# Patient Record
Sex: Female | Born: 1950 | Race: Black or African American | Hispanic: No | Marital: Single | Smoking: Current every day smoker
Health system: Southern US, Community
[De-identification: ages and names within clinical notes are randomized; demographics above are authoritative.]

## PROBLEM LIST (undated history)

## (undated) DIAGNOSIS — E7800 Pure hypercholesterolemia, unspecified: Secondary | ICD-10-CM

## (undated) DIAGNOSIS — H269 Unspecified cataract: Secondary | ICD-10-CM

## (undated) DIAGNOSIS — F329 Major depressive disorder, single episode, unspecified: Secondary | ICD-10-CM

## (undated) DIAGNOSIS — M549 Dorsalgia, unspecified: Secondary | ICD-10-CM

## (undated) DIAGNOSIS — G8929 Other chronic pain: Secondary | ICD-10-CM

## (undated) DIAGNOSIS — I10 Essential (primary) hypertension: Secondary | ICD-10-CM

## (undated) DIAGNOSIS — N200 Calculus of kidney: Secondary | ICD-10-CM

## (undated) DIAGNOSIS — F32A Depression, unspecified: Secondary | ICD-10-CM

## (undated) DIAGNOSIS — J189 Pneumonia, unspecified organism: Secondary | ICD-10-CM

## (undated) DIAGNOSIS — I161 Hypertensive emergency: Secondary | ICD-10-CM

## (undated) DIAGNOSIS — N390 Urinary tract infection, site not specified: Secondary | ICD-10-CM

## (undated) DIAGNOSIS — G629 Polyneuropathy, unspecified: Secondary | ICD-10-CM

## (undated) DIAGNOSIS — E119 Type 2 diabetes mellitus without complications: Secondary | ICD-10-CM

## (undated) DIAGNOSIS — R519 Headache, unspecified: Secondary | ICD-10-CM

## (undated) DIAGNOSIS — G43909 Migraine, unspecified, not intractable, without status migrainosus: Secondary | ICD-10-CM

## (undated) DIAGNOSIS — I2699 Other pulmonary embolism without acute cor pulmonale: Secondary | ICD-10-CM

## (undated) DIAGNOSIS — D735 Infarction of spleen: Secondary | ICD-10-CM

## (undated) DIAGNOSIS — M1990 Unspecified osteoarthritis, unspecified site: Secondary | ICD-10-CM

## (undated) DIAGNOSIS — R51 Headache: Secondary | ICD-10-CM

## (undated) HISTORY — DX: Major depressive disorder, single episode, unspecified: F32.9

## (undated) HISTORY — PX: CHOLECYSTECTOMY: SHX55

## (undated) HISTORY — PX: EYE SURGERY: SHX253

## (undated) HISTORY — PX: CARPAL TUNNEL RELEASE: SHX101

## (undated) HISTORY — PX: APPENDECTOMY: SHX54

## (undated) HISTORY — PX: TONSILLECTOMY: SUR1361

## (undated) HISTORY — PX: CYSTOSCOPY W/ STONE MANIPULATION: SHX1427

## (undated) HISTORY — DX: Infarction of spleen: D73.5

## (undated) HISTORY — PX: BREAST BIOPSY: SHX20

## (undated) HISTORY — PX: REFRACTIVE SURGERY: SHX103

## (undated) HISTORY — DX: Depression, unspecified: F32.A

## (undated) HISTORY — PX: VAGINAL HYSTERECTOMY: SUR661

## (undated) HISTORY — DX: Polyneuropathy, unspecified: G62.9

---

## 1980-12-21 HISTORY — PX: DILATION AND CURETTAGE OF UTERUS: SHX78

## 2012-08-16 ENCOUNTER — Encounter (HOSPITAL_COMMUNITY): Payer: Self-pay | Admitting: Emergency Medicine

## 2012-08-16 ENCOUNTER — Encounter (HOSPITAL_COMMUNITY): Payer: Self-pay | Admitting: *Deleted

## 2012-08-16 ENCOUNTER — Observation Stay (HOSPITAL_COMMUNITY)
Admission: EM | Admit: 2012-08-16 | Discharge: 2012-08-21 | Disposition: A | Discharge status: Home / Self Care | Payer: Medicare Other | Attending: Internal Medicine | Admitting: Internal Medicine

## 2012-08-16 ENCOUNTER — Emergency Department (HOSPITAL_COMMUNITY): Payer: Medicare Other

## 2012-08-16 ENCOUNTER — Emergency Department (INDEPENDENT_AMBULATORY_CARE_PROVIDER_SITE_OTHER)
Admission: EM | Admit: 2012-08-16 | Discharge: 2012-08-16 | Disposition: A | Discharge status: Nursing Home (Includes ICF) | Payer: Medicare Other | Source: Home / Self Care | Attending: Emergency Medicine | Admitting: Emergency Medicine

## 2012-08-16 DIAGNOSIS — M549 Dorsalgia, unspecified: Secondary | ICD-10-CM

## 2012-08-16 DIAGNOSIS — R5383 Other fatigue: Secondary | ICD-10-CM | POA: Diagnosis not present

## 2012-08-16 DIAGNOSIS — R5381 Other malaise: Secondary | ICD-10-CM | POA: Insufficient documentation

## 2012-08-16 DIAGNOSIS — T783XXA Angioneurotic edema, initial encounter: Secondary | ICD-10-CM | POA: Insufficient documentation

## 2012-08-16 DIAGNOSIS — R197 Diarrhea, unspecified: Secondary | ICD-10-CM | POA: Insufficient documentation

## 2012-08-16 DIAGNOSIS — I10 Essential (primary) hypertension: Secondary | ICD-10-CM | POA: Insufficient documentation

## 2012-08-16 DIAGNOSIS — E119 Type 2 diabetes mellitus without complications: Secondary | ICD-10-CM

## 2012-08-16 DIAGNOSIS — E1149 Type 2 diabetes mellitus with other diabetic neurological complication: Secondary | ICD-10-CM | POA: Insufficient documentation

## 2012-08-16 DIAGNOSIS — A498 Other bacterial infections of unspecified site: Secondary | ICD-10-CM | POA: Insufficient documentation

## 2012-08-16 DIAGNOSIS — E1143 Type 2 diabetes mellitus with diabetic autonomic (poly)neuropathy: Secondary | ICD-10-CM

## 2012-08-16 DIAGNOSIS — F17200 Nicotine dependence, unspecified, uncomplicated: Secondary | ICD-10-CM | POA: Insufficient documentation

## 2012-08-16 DIAGNOSIS — K7689 Other specified diseases of liver: Secondary | ICD-10-CM | POA: Diagnosis not present

## 2012-08-16 DIAGNOSIS — E1142 Type 2 diabetes mellitus with diabetic polyneuropathy: Secondary | ICD-10-CM | POA: Diagnosis present

## 2012-08-16 DIAGNOSIS — Z9181 History of falling: Secondary | ICD-10-CM | POA: Insufficient documentation

## 2012-08-16 DIAGNOSIS — R42 Dizziness and giddiness: Secondary | ICD-10-CM | POA: Insufficient documentation

## 2012-08-16 DIAGNOSIS — R109 Unspecified abdominal pain: Principal | ICD-10-CM | POA: Insufficient documentation

## 2012-08-16 DIAGNOSIS — R29898 Other symptoms and signs involving the musculoskeletal system: Secondary | ICD-10-CM | POA: Diagnosis not present

## 2012-08-16 DIAGNOSIS — K3184 Gastroparesis: Secondary | ICD-10-CM | POA: Insufficient documentation

## 2012-08-16 DIAGNOSIS — N390 Urinary tract infection, site not specified: Secondary | ICD-10-CM | POA: Diagnosis present

## 2012-08-16 DIAGNOSIS — T361X5A Adverse effect of cephalosporins and other beta-lactam antibiotics, initial encounter: Secondary | ICD-10-CM | POA: Insufficient documentation

## 2012-08-16 DIAGNOSIS — Y9210 Unspecified residential institution as the place of occurrence of the external cause: Secondary | ICD-10-CM | POA: Insufficient documentation

## 2012-08-16 DIAGNOSIS — N3000 Acute cystitis without hematuria: Secondary | ICD-10-CM | POA: Insufficient documentation

## 2012-08-16 DIAGNOSIS — G8929 Other chronic pain: Secondary | ICD-10-CM | POA: Diagnosis present

## 2012-08-16 DIAGNOSIS — T7840XA Allergy, unspecified, initial encounter: Secondary | ICD-10-CM | POA: Diagnosis not present

## 2012-08-16 DIAGNOSIS — Z86711 Personal history of pulmonary embolism: Secondary | ICD-10-CM | POA: Insufficient documentation

## 2012-08-16 DIAGNOSIS — R918 Other nonspecific abnormal finding of lung field: Secondary | ICD-10-CM | POA: Diagnosis not present

## 2012-08-16 DIAGNOSIS — B9620 Unspecified Escherichia coli [E. coli] as the cause of diseases classified elsewhere: Secondary | ICD-10-CM

## 2012-08-16 HISTORY — DX: Urinary tract infection, site not specified: N39.0

## 2012-08-16 HISTORY — DX: Essential (primary) hypertension: I10

## 2012-08-16 HISTORY — DX: Other pulmonary embolism without acute cor pulmonale: I26.99

## 2012-08-16 LAB — COMPREHENSIVE METABOLIC PANEL
ALT: 13 U/L (ref 0–35)
AST: 13 U/L (ref 0–37)
Albumin: 2.7 g/dL — ABNORMAL LOW (ref 3.5–5.2)
CO2: 31 mEq/L (ref 19–32)
Calcium: 9.9 mg/dL (ref 8.4–10.5)
GFR calc non Af Amer: 89 mL/min — ABNORMAL LOW (ref >90)
Sodium: 141 mEq/L (ref 135–145)
Total Protein: 7.3 g/dL (ref 6.0–8.3)

## 2012-08-16 LAB — URINE MICROSCOPIC-ADD ON

## 2012-08-16 LAB — CBC WITH DIFFERENTIAL/PLATELET
Basophils Absolute: 0.1 10*3/uL (ref 0.0–0.1)
Basophils Relative: 1 % (ref 0–1)
Eosinophils Relative: 3 % (ref 0–5)
Lymphocytes Relative: 30 % (ref 12–46)
MCHC: 33.9 g/dL (ref 30.0–36.0)
MCV: 83.5 fL (ref 78.0–100.0)
Platelets: 326 10*3/uL (ref 150–400)
RDW: 15.5 % (ref 11.5–15.5)
WBC: 9 10*3/uL (ref 4.0–10.5)

## 2012-08-16 LAB — URINALYSIS, ROUTINE W REFLEX MICROSCOPIC
Leukocytes, UA: NEGATIVE
Protein, ur: >300 mg/dL — AB
Urobilinogen, UA: 0.2 mg/dL (ref 0.0–1.0)

## 2012-08-16 MED ORDER — DEXTROSE 5 % IV SOLN
1.0000 g | Freq: Once | INTRAVENOUS | Status: AC
  Administered 2012-08-16: 1 g via INTRAVENOUS
  Filled 2012-08-16: qty 10

## 2012-08-16 MED ORDER — SODIUM CHLORIDE 0.9 % IV BOLUS (SEPSIS)
1000.0000 mL | Freq: Once | INTRAVENOUS | Status: AC
  Administered 2012-08-16: 1000 mL via INTRAVENOUS

## 2012-08-16 NOTE — ED Notes (Signed)
The pt is c/o weakness and having multiple falls.  She has not had her diabetic bp or coumadin for 3 months

## 2012-08-16 NOTE — ED Notes (Signed)
Recently moved to area. Patient is out of her regular medications

## 2012-08-16 NOTE — ED Provider Notes (Signed)
History     CSN: [id number]  Arrival date & time 08/16/12  1454   None     Chief Complaint  Patient presents with  . Fall    (Consider location/radiation/quality/duration/timing/severity/associated sxs/prior treatment) The history is provided by the patient.   Joy Butler is a 61 y.o. female who would like a refill of her medications, states she has been out of medications for three months related to financial issues.  There is a known history of hypertension, diabetes, pulmonary embolism.  Primary care provider is in [place].  Reports general ill feeling, reports concern related to bilateral leg weakness associated with back pain.  States previous history of injections in back.  States in the past week she has been increasingly weak with decreased inability to stand for longer periods of time.     Past Medical History  Diagnosis Date  . Diabetes mellitus   . Hypertension   . Pulmonary embolism      Past Surgical History  Procedure Date  . Abdominal hysterectomy     No family history on file.  History  Substance Use Topics  . Smoking status: Current Everyday Smoker  . Smokeless tobacco: Not on file  . Alcohol Use: No    OB History    Grav Para Term Preterm Abortions TAB SAB Ect Mult Living                  Review of Systems  Eyes: Positive for photophobia.  Respiratory: Negative.   Cardiovascular: Negative.   Musculoskeletal: Positive for myalgias and back pain.  Skin: Negative.   Neurological: Positive for dizziness and weakness.    Allergies  Sulfa antibiotics and Sulfur  Home Medications  No current outpatient prescriptions on file.  BP 159/87  Pulse 97  Temp 98.5 F (36.9 C) (Oral)  Resp 20  SpO2 100%  Physical Exam  Nursing note and vitals reviewed. Constitutional: She is oriented to person, place, and time. Vital signs are normal. She appears well-developed and well-nourished. She is active and cooperative.       Tearful during  exam  HENT:  Head: Normocephalic.  Eyes: Conjunctivae and EOM are normal. Pupils are equal, round, and reactive to light. No scleral icterus.       photophobia  Neck: Trachea normal. Neck supple. No spinous process tenderness and no muscular tenderness present.  Cardiovascular: Normal rate, regular rhythm, normal heart sounds, intact distal pulses and normal pulses.   Pulmonary/Chest: Effort normal and breath sounds normal.  Abdominal: Soft. Normal appearance. There is tenderness in the right upper quadrant. There is no CVA tenderness.  Musculoskeletal:       Right knee: Normal.       Left knee: Normal.       Right ankle: Normal. Achilles tendon normal.       Left ankle: Normal. Achilles tendon normal.       Cervical back: Normal.       Thoracic back: Normal.       Lumbar back: She exhibits tenderness and spasm.       Back:  Neurological: She is alert and oriented to person, place, and time. She has normal strength. No cranial nerve deficit or sensory deficit. Coordination normal. GCS eye subscore is 4. GCS verbal subscore is 5. GCS motor subscore is 6.  Skin: Skin is warm and dry.  Psychiatric: She has a normal mood and affect. Her speech is normal and behavior is normal. Judgment and thought content  normal. Cognition and memory are normal.    ED Course  Procedures (including critical care time)  Labs Reviewed - No data to display No results found.   1. Leg weakness, bilateral       MDM  Transfer to [facility] for further evaluation of abdominal pain, weakness and management of health conditions.         Johnsie Kindred, NP 08/16/12 2152

## 2012-08-16 NOTE — ED Notes (Signed)
Patient has bilateral leg weakness, right with more weakness than left.  Reports knees give away easily.  Patient is new to the area and has not had routine medicines in several months, symptoms have been for vague period of time

## 2012-08-17 ENCOUNTER — Encounter (HOSPITAL_COMMUNITY): Payer: Self-pay | Admitting: Radiology

## 2012-08-17 ENCOUNTER — Emergency Department (HOSPITAL_COMMUNITY): Payer: Medicare Other

## 2012-08-17 DIAGNOSIS — R109 Unspecified abdominal pain: Secondary | ICD-10-CM | POA: Diagnosis present

## 2012-08-17 DIAGNOSIS — R5381 Other malaise: Secondary | ICD-10-CM | POA: Diagnosis not present

## 2012-08-17 DIAGNOSIS — E119 Type 2 diabetes mellitus without complications: Secondary | ICD-10-CM | POA: Diagnosis present

## 2012-08-17 DIAGNOSIS — I10 Essential (primary) hypertension: Secondary | ICD-10-CM | POA: Diagnosis not present

## 2012-08-17 DIAGNOSIS — K7689 Other specified diseases of liver: Secondary | ICD-10-CM | POA: Diagnosis not present

## 2012-08-17 DIAGNOSIS — Z86711 Personal history of pulmonary embolism: Secondary | ICD-10-CM

## 2012-08-17 DIAGNOSIS — R197 Diarrhea, unspecified: Secondary | ICD-10-CM | POA: Diagnosis not present

## 2012-08-17 LAB — CBC WITH DIFFERENTIAL/PLATELET
Basophils Absolute: 0 10*3/uL (ref 0.0–0.1)
HCT: 41 % (ref 36.0–46.0)
Lymphocytes Relative: 27 % (ref 12–46)
Monocytes Absolute: 0.7 10*3/uL (ref 0.1–1.0)
Neutro Abs: 4.7 10*3/uL (ref 1.7–7.7)
Platelets: 269 10*3/uL (ref 150–400)
RDW: 15.6 % — ABNORMAL HIGH (ref 11.5–15.5)
WBC: 7.7 10*3/uL (ref 4.0–10.5)

## 2012-08-17 LAB — COMPREHENSIVE METABOLIC PANEL
Albumin: 2.4 g/dL — ABNORMAL LOW (ref 3.5–5.2)
Alkaline Phosphatase: 114 U/L (ref 39–117)
Alkaline Phosphatase: 117 U/L (ref 39–117)
BUN: 8 mg/dL (ref 6–23)
BUN: 8 mg/dL (ref 6–23)
CO2: 31 mEq/L (ref 19–32)
Calcium: 9 mg/dL (ref 8.4–10.5)
Chloride: 105 mEq/L (ref 96–112)
Creatinine, Ser: 0.87 mg/dL (ref 0.50–1.10)
GFR calc Af Amer: 81 mL/min — ABNORMAL LOW (ref >90)
GFR calc non Af Amer: 88 mL/min — ABNORMAL LOW (ref >90)
Glucose, Bld: 181 mg/dL — ABNORMAL HIGH (ref 70–99)
Glucose, Bld: 198 mg/dL — ABNORMAL HIGH (ref 70–99)
Potassium: 3.2 mEq/L — ABNORMAL LOW (ref 3.5–5.1)
Potassium: 3.2 mEq/L — ABNORMAL LOW (ref 3.5–5.1)
Total Bilirubin: 0.2 mg/dL — ABNORMAL LOW (ref 0.3–1.2)
Total Protein: 6.4 g/dL (ref 6.0–8.3)

## 2012-08-17 LAB — GLUCOSE, CAPILLARY: Glucose-Capillary: 160 mg/dL — ABNORMAL HIGH (ref 70–99)

## 2012-08-17 LAB — LACTIC ACID, PLASMA: Lactic Acid, Venous: 1.4 mmol/L (ref 0.5–2.2)

## 2012-08-17 LAB — LIPASE, BLOOD: Lipase: 45 U/L (ref 11–59)

## 2012-08-17 LAB — RAPID URINE DRUG SCREEN, HOSP PERFORMED
Amphetamines: NOT DETECTED
Benzodiazepines: NOT DETECTED
Opiates: POSITIVE — AB

## 2012-08-17 MED ORDER — LOSARTAN POTASSIUM 50 MG PO TABS
100.0000 mg | ORAL_TABLET | Freq: Every day | ORAL | Status: DC
  Administered 2012-08-17 – 2012-08-21 (×5): 100 mg via ORAL
  Filled 2012-08-17 (×5): qty 2

## 2012-08-17 MED ORDER — ONDANSETRON HCL 4 MG/2ML IJ SOLN
4.0000 mg | Freq: Four times a day (QID) | INTRAMUSCULAR | Status: DC | PRN
  Administered 2012-08-17: 4 mg via INTRAVENOUS
  Filled 2012-08-17: qty 2

## 2012-08-17 MED ORDER — POTASSIUM CHLORIDE 10 MEQ/100ML IV SOLN
10.0000 meq | INTRAVENOUS | Status: AC
  Administered 2012-08-17 (×4): 10 meq via INTRAVENOUS
  Filled 2012-08-17 (×4): qty 100

## 2012-08-17 MED ORDER — ONDANSETRON HCL 4 MG/2ML IJ SOLN
4.0000 mg | Freq: Once | INTRAMUSCULAR | Status: AC
  Administered 2012-08-17: 4 mg via INTRAVENOUS
  Filled 2012-08-17: qty 2

## 2012-08-17 MED ORDER — INSULIN ASPART 100 UNIT/ML ~~LOC~~ SOLN
0.0000 [IU] | Freq: Three times a day (TID) | SUBCUTANEOUS | Status: DC
  Administered 2012-08-17: 2 [IU] via SUBCUTANEOUS
  Administered 2012-08-17: 18:00:00 via SUBCUTANEOUS
  Administered 2012-08-18: 3 [IU] via SUBCUTANEOUS
  Administered 2012-08-18 (×2): 1 [IU] via SUBCUTANEOUS
  Administered 2012-08-19 (×2): 2 [IU] via SUBCUTANEOUS
  Administered 2012-08-19 – 2012-08-20 (×3): 5 [IU] via SUBCUTANEOUS
  Administered 2012-08-20: 17:00:00 via SUBCUTANEOUS
  Administered 2012-08-21: 5 [IU] via SUBCUTANEOUS

## 2012-08-17 MED ORDER — MORPHINE SULFATE 4 MG/ML IJ SOLN
4.0000 mg | Freq: Once | INTRAMUSCULAR | Status: AC
  Administered 2012-08-18: 4 mg via INTRAVENOUS
  Filled 2012-08-17: qty 1

## 2012-08-17 MED ORDER — MORPHINE SULFATE 4 MG/ML IJ SOLN
4.0000 mg | Freq: Once | INTRAMUSCULAR | Status: AC
  Administered 2012-08-17: 4 mg via INTRAVENOUS
  Filled 2012-08-17: qty 1

## 2012-08-17 MED ORDER — POTASSIUM CHLORIDE IN NACL 20-0.9 MEQ/L-% IV SOLN
INTRAVENOUS | Status: DC
  Administered 2012-08-17 – 2012-08-20 (×5): via INTRAVENOUS
  Filled 2012-08-17 (×14): qty 1000

## 2012-08-17 MED ORDER — ONDANSETRON HCL 4 MG PO TABS: 4.0000 mg | ORAL_TABLET | Freq: Four times a day (QID) | ORAL | Status: DC | PRN

## 2012-08-17 MED ORDER — DEXTROSE 5 % IV SOLN
1.0000 g | INTRAVENOUS | Status: DC
  Administered 2012-08-17 – 2012-08-18 (×2): 1 g via INTRAVENOUS
  Filled 2012-08-17 (×3): qty 10

## 2012-08-17 MED ORDER — IOHEXOL 300 MG/ML  SOLN
100.0000 mL | Freq: Once | INTRAMUSCULAR | Status: AC | PRN
  Administered 2012-08-17: 100 mL via INTRAVENOUS

## 2012-08-17 MED ORDER — ACETAMINOPHEN 650 MG RE SUPP: 650.0000 mg | Freq: Four times a day (QID) | RECTAL | Status: DC | PRN

## 2012-08-17 MED ORDER — MORPHINE SULFATE 2 MG/ML IJ SOLN
1.0000 mg | INTRAMUSCULAR | Status: DC | PRN
  Administered 2012-08-17 – 2012-08-20 (×8): 1 mg via INTRAVENOUS
  Filled 2012-08-17 (×8): qty 1

## 2012-08-17 MED ORDER — ACETAMINOPHEN 325 MG PO TABS
650.0000 mg | ORAL_TABLET | Freq: Four times a day (QID) | ORAL | Status: DC | PRN
  Administered 2012-08-18: 650 mg via ORAL
  Filled 2012-08-17: qty 2

## 2012-08-17 MED ORDER — HYDROMORPHONE HCL PF 1 MG/ML IJ SOLN
1.0000 mg | Freq: Once | INTRAMUSCULAR | Status: AC
  Administered 2012-08-17: 1 mg via INTRAVENOUS
  Filled 2012-08-17: qty 1

## 2012-08-17 MED ORDER — SODIUM CHLORIDE 0.9 % IJ SOLN
3.0000 mL | Freq: Two times a day (BID) | INTRAMUSCULAR | Status: DC
  Administered 2012-08-17 – 2012-08-19 (×4): 3 mL via INTRAVENOUS

## 2012-08-17 MED ORDER — SODIUM CHLORIDE 0.9 % IV SOLN
INTRAVENOUS | Status: DC
  Administered 2012-08-17: 01:00:00 via INTRAVENOUS

## 2012-08-17 NOTE — Consult Note (Addendum)
Urology Consult   Physician requesting consult: Dr. Toniann Fail   Reason for consult: Air in bladder on CT scan  History of Present Illness: Joy Butler is a 61 y.o. AA female with PMH significant for DM, HTN, and PE who was admitted yesterday for treatment of UTI and diarrhea.  She states she has had severe abdominal pain and diarrhea for at least a month.  She denies N/V.  CT scan in ED was unremarkable except for air in the bladder.  UA was consistent with UTI.  Culture is pending.  She is currently receiving IV Rocephin.  C Diff and stool cultures are pending as well.  She denies recent bladder instrumentation or catheters.    She states she was hospitalized in 03/2012 in [place] for a pneumonia and was treated for a UTI at that time as well.  She has had a couple of UTIs a year for several years.  She is sexually active but does not note a correlation with her infections.  She denies passing air with urine, however, she states that "it feels like that area burps after I pee."  She denies dysuria, hematuria, incontinence, and fecal matter in her urine.  She denies a hx of diverticulosis/diverticulitis.  She had one kidney stone in the 1990s which required removal.    Currently her only complain is of abd pain which she states is diffuse.  She denies F/C, SOB, CP, N/V, and LE pain.     Past Medical History  Diagnosis Date  . Diabetes mellitus   . Hypertension   . Pulmonary embolism     Past Surgical History  Procedure Date  . Abdominal hysterectomy   cholecystectomy--open c section Kidney stone manipulation Left carpel tunnel repair   Current Hospital Medications:  Home medications: Prior to Admission medications   Medication Sig Start Date End Date Taking? Authorizing Provider  insulin aspart (NOVOLOG) 100 UNIT/ML injection Inject into the skin 3 (three) times daily before meals.   Yes Historical Provider, MD  losartan-hydrochlorothiazide (HYZAAR) 100-25 MG per tablet  Take 1 tablet by mouth daily.   Yes Historical Provider, MD    Scheduled Meds:   . cefTRIAXone (ROCEPHIN)  IV  1 g Intravenous Once  . cefTRIAXone (ROCEPHIN)  IV  1 g Intravenous Q24H  .  HYDROmorphone (DILAUDID) injection  1 mg Intravenous Once  . insulin aspart  0-9 Units Subcutaneous TID WC  . losartan  100 mg Oral Daily  .  morphine injection  4 mg Intravenous Once  .  morphine injection  4 mg Intravenous Once  .  morphine injection  4 mg Intravenous Once  . ondansetron  4 mg Intravenous Once  . ondansetron  4 mg Intravenous Once  . sodium chloride  1,000 mL Intravenous Once  . sodium chloride  3 mL Intravenous Q12H   Continuous Infusions:   . 0.9 % NaCl with KCl 20 mEq / L 100 mL/hr at 08/17/12 0825  . DISCONTD: sodium chloride 125 mL/hr at 08/17/12 0053   PRN Meds:.acetaminophen, acetaminophen, iohexol, morphine injection, ondansetron (ZOFRAN) IV, ondansetron  Allergies:  Allergies  Allergen Reactions  . Sulfa Antibiotics Rash  . Sulfur Rash    History reviewed. No pertinent family history.  Social History:  reports that she has been smoking.  She does not have any smokeless tobacco history on file. She reports that she does not drink alcohol or use illicit drugs.  ROS: A complete review of systems was performed.  All systems are  negative except for pertinent findings as noted.  Physical Exam:  Vital signs in last 24 hours: Temp:  [97.5 F (36.4 C)-98.5 F (36.9 C)] 97.5 F (36.4 C) (08/28 0752) Pulse Rate:  [74-97] 76  (08/28 0752) Resp:  [16-21] 18  (08/28 0752) BP: (159-177)/(79-98) 165/90 mmHg (08/28 1000) SpO2:  [88 %-100 %] 100 % (08/28 1000) General:  Alert and oriented, No acute distress; obese HEENT: Normocephalic, atraumatic Neck: No JVD or lymphadenopathy Cardiovascular: Regular rate and rhythm Lungs: Clear bilaterally Abdomen: Soft, nondistended, no abdominal masses; tender RLQ and epigastric areas Back: No CVA tenderness Extremities: trace  edema Neurologic: Grossly intact  Laboratory Data:   Basename 08/17/12 0938 08/16/12 2030  WBC 7.7 9.0  HGB 13.4 15.2*  HCT 41.0 44.9  PLT 269 326     Basename 08/17/12 0938 08/16/12 2030  NA 143 141  K 3.2* 3.1*  CL 105 101  GLUCOSE 181* 191*  BUN 8 9  CALCIUM 9.0 9.9  CREATININE 0.87 0.76     Results for orders placed during the hospital encounter of 08/16/12 (from the past 24 hour(s))  URINALYSIS, ROUTINE W REFLEX MICROSCOPIC     Status: Abnormal   Collection Time   08/16/12  8:09 PM      Component Value Range   Color, Urine YELLOW  YELLOW   APPearance CLOUDY (*) CLEAR   Specific Gravity, Urine 1.027  1.005 - 1.030   pH 6.0  5.0 - 8.0   Glucose, UA 250 (*) NEGATIVE mg/dL   Hgb urine dipstick MODERATE (*) NEGATIVE   Bilirubin Urine SMALL (*) NEGATIVE   Ketones, ur NEGATIVE  NEGATIVE mg/dL   Protein, ur >782 (*) NEGATIVE mg/dL   Urobilinogen, UA 0.2  0.0 - 1.0 mg/dL   Nitrite NEGATIVE  NEGATIVE   Leukocytes, UA NEGATIVE  NEGATIVE  URINE MICROSCOPIC-ADD ON     Status: Abnormal   Collection Time   08/16/12  8:09 PM      Component Value Range   Squamous Epithelial / LPF FEW (*) RARE   WBC, UA 3-6  <3 WBC/hpf   RBC / HPF 7-10  <3 RBC/hpf   Bacteria, UA MANY (*) RARE   Casts HYALINE CASTS (*) NEGATIVE  CBC WITH DIFFERENTIAL     Status: Abnormal   Collection Time   08/16/12  8:30 PM      Component Value Range   WBC 9.0  4.0 - 10.5 K/uL   RBC 5.38 (*) 3.87 - 5.11 MIL/uL   Hemoglobin 15.2 (*) 12.0 - 15.0 g/dL   HCT 95.6  21.3 - 08.6 %   MCV 83.5  78.0 - 100.0 fL   MCH 28.3  26.0 - 34.0 pg   MCHC 33.9  30.0 - 36.0 g/dL   RDW 57.8  46.9 - 62.9 %   Platelets 326  150 - 400 K/uL   Neutrophils Relative 59  43 - 77 %   Neutro Abs 5.4  1.7 - 7.7 K/uL   Lymphocytes Relative 30  12 - 46 %   Lymphs Abs 2.7  0.7 - 4.0 K/uL   Monocytes Relative 7  3 - 12 %   Monocytes Absolute 0.7  0.1 - 1.0 K/uL   Eosinophils Relative 3  0 - 5 %   Eosinophils Absolute 0.3  0.0 - 0.7  K/uL   Basophils Relative 1  0 - 1 %   Basophils Absolute 0.1  0.0 - 0.1 K/uL  COMPREHENSIVE METABOLIC PANEL  Status: Abnormal   Collection Time   08/16/12  8:30 PM      Component Value Range   Sodium 141  135 - 145 mEq/L   Potassium 3.1 (*) 3.5 - 5.1 mEq/L   Chloride 101  96 - 112 mEq/L   CO2 31  19 - 32 mEq/L   Glucose, Bld 191 (*) 70 - 99 mg/dL   BUN 9  6 - 23 mg/dL   Creatinine, Ser 9.14  0.50 - 1.10 mg/dL   Calcium 9.9  8.4 - 78.2 mg/dL   Total Protein 7.3  6.0 - 8.3 g/dL   Albumin 2.7 (*) 3.5 - 5.2 g/dL   AST 13  0 - 37 U/L   ALT 13  0 - 35 U/L   Alkaline Phosphatase 128 (*) 39 - 117 U/L   Total Bilirubin 0.3  0.3 - 1.2 mg/dL   GFR calc non Af Amer 89 (*) >90 mL/min   GFR calc Af Amer >90  >90 mL/min  GLUCOSE, CAPILLARY     Status: Abnormal   Collection Time   08/16/12  8:35 PM      Component Value Range   Glucose-Capillary 198 (*) 70 - 99 mg/dL  LIPASE, BLOOD     Status: Normal   Collection Time   08/17/12 12:57 AM      Component Value Range   Lipase 45  11 - 59 U/L  LACTIC ACID, PLASMA     Status: Normal   Collection Time   08/17/12 12:57 AM      Component Value Range   Lactic Acid, Venous 1.4  0.5 - 2.2 mmol/L  COMPREHENSIVE METABOLIC PANEL     Status: Abnormal   Collection Time   08/17/12  9:38 AM      Component Value Range   Sodium 143  135 - 145 mEq/L   Potassium 3.2 (*) 3.5 - 5.1 mEq/L   Chloride 105  96 - 112 mEq/L   CO2 30  19 - 32 mEq/L   Glucose, Bld 181 (*) 70 - 99 mg/dL   BUN 8  6 - 23 mg/dL   Creatinine, Ser 9.56  0.50 - 1.10 mg/dL   Calcium 9.0  8.4 - 21.3 mg/dL   Total Protein 6.4  6.0 - 8.3 g/dL   Albumin 2.4 (*) 3.5 - 5.2 g/dL   AST 14  0 - 37 U/L   ALT 10  0 - 35 U/L   Alkaline Phosphatase 114  39 - 117 U/L   Total Bilirubin 0.2 (*) 0.3 - 1.2 mg/dL   GFR calc non Af Amer 70 (*) >90 mL/min   GFR calc Af Amer 81 (*) >90 mL/min  CBC WITH DIFFERENTIAL     Status: Abnormal   Collection Time   08/17/12  9:38 AM      Component Value Range    WBC 7.7  4.0 - 10.5 K/uL   RBC 4.85  3.87 - 5.11 MIL/uL   Hemoglobin 13.4  12.0 - 15.0 g/dL   HCT 08.6  57.8 - 46.9 %   MCV 84.5  78.0 - 100.0 fL   MCH 27.6  26.0 - 34.0 pg   MCHC 32.7  30.0 - 36.0 g/dL   RDW 62.9 (*) 52.8 - 41.3 %   Platelets 269  150 - 400 K/uL   Neutrophils Relative 61  43 - 77 %   Neutro Abs 4.7  1.7 - 7.7 K/uL   Lymphocytes Relative 27  12 - 46 %  Lymphs Abs 2.1  0.7 - 4.0 K/uL   Monocytes Relative 9  3 - 12 %   Monocytes Absolute 0.7  0.1 - 1.0 K/uL   Eosinophils Relative 3  0 - 5 %   Eosinophils Absolute 0.2  0.0 - 0.7 K/uL   Basophils Relative 1  0 - 1 %   Basophils Absolute 0.0  0.0 - 0.1 K/uL  GLUCOSE, CAPILLARY     Status: Abnormal   Collection Time   08/17/12 11:16 AM      Component Value Range   Glucose-Capillary 177 (*) 70 - 99 mg/dL   Comment 1 Documented in Chart     Comment 2 Notify RN     No results found for this or any previous visit (from the past 240 hour(s)).  Renal Function:  Basename 08/17/12 0938 08/16/12 2030  CREATININE 0.87 0.76   The CrCl is unknown because both a height and weight (above a minimum accepted value) are required for this calculation.  Radiologic Imaging: Ct Head Wo Contrast  08/17/2012  *RADIOLOGY REPORT*  Clinical Data: Falls, weakness.  CT HEAD WITHOUT CONTRAST  Technique:  Contiguous axial images were obtained from the base of the skull through the vertex without contrast.  Comparison: None.  Findings: Periventricular and subcortical white matter hypodensities are most in keeping with chronic microangiopathic change. There is no evidence for acute hemorrhage, hydrocephalus, mass lesion, or abnormal extra-axial fluid collection.  No definite CT evidence for acute infarction.  The visualized paranasal sinuses and mastoid air cells are predominately clear.  No displaced calvarial fracture.  IMPRESSION: White matter changes as above.  No definite acute intracranial abnormality.   Original Report Authenticated By:  Waneta Martins, M.D.    Ct Abdomen Pelvis W Contrast  08/17/2012  *RADIOLOGY REPORT*  Clinical Data: Abdominal pain  CT ABDOMEN AND PELVIS WITH CONTRAST  Technique:  Multidetector CT imaging of the abdomen and pelvis was performed following the standard protocol during bolus administration of intravenous contrast.  Contrast: OMNIPAQUE IOHEXOL 300 MG/ML  SOLN  Comparison: None.  Findings: There is significant streak artifact secondary to patient contact with the gantry.  Minimal dependent scarring versus atelectasis, right greater than left.  Heart size upper normal.  No pleural or pericardial effusion.  Low attenuation of the liver is nonspecific post contrast however suggests fatty infiltration.  Absent gallbladder.  No biliary ductal dilatation.  Unremarkable spleen, pancreas, adrenal glands.  Nonobstructing renal stones versus vascular calcifications.  Too small further characterize hypodensities within the kidneys. No hydronephrosis or hydroureter.  No bowel obstruction.  No CT evidence for colitis.  Normal appendix.  No free intraperitoneal air or fluid.  No lymphadenopathy.  Circumaortic left renal vein. There is scattered atherosclerotic calcification of the aorta and its branches. No aneurysmal dilatation.  Decompressed bladder with air non dependently. Diminutive or absent uterus.  No adnexal mass.  Multilevel degenerative changes of the imaged spine. No acute or aggressive appearing osseous lesion.  IMPRESSION: Air within the bladder is nonspecific.  Correlate with recent history of instrumentation. In the absence of instrumentation, differential would include fistulous communication with bowel.  Hepatic steatosis.  Nonobstructing renal stones versus vascular calcifications.   Original Report Authenticated By: Waneta Martins, M.D.    Dg Chest Portable 1 View  08/16/2012  *RADIOLOGY REPORT*  Clinical Data: Weakness, falls.  PORTABLE CHEST - 1 VIEW  Comparison: None.  Findings: Heart size  upper normal to mildly enlarged. Right hemidiaphragm mildly elevated with mild  lung base opacity. Otherwise, no focal consolidation, pleural effusion, or pneumothorax.  No acute osseous finding.  IMPRESSION: Heart size upper normal.  Minimal lung base opacity, likely atelectasis.   Original Report Authenticated By: Waneta Martins, M.D.     Impression/Assessment:  UTI Micro hematuria Air in the bladder  Plan:  Continue to treat UTI with Rocephin and f/u with culture.  Change Abx if appropriate after sensitivities reviewed.  ABx course should be 7-10 days.    Microhematuria likely due to infection but as pt is a smoker this will need to be followed up to ensure resolution after infection is treated.  If MH persists, she will require cysto and possibly CT A/P w/wo contrast.  Air in bladder is likely due to infection as well.  The "burping" she experiences has been correlated to times of infection and resolves after the infection is treated.  She has no other indication of fistula, however, she will need to be re-evaled after infection is cleared to ensure air resolves.  This can be done as an outpatient if necessary and may require cysto +/- repeat imaging.    Silas Flood 08/17/2012, 1:02 PM   I have seen and examined the patient and I have reviewed the medical history and above findings and agree with assessment and plan.  Pt has no history of colon cancer or diverticulitis and a fistula appears unlikely based on history.  Considering her long history of diabetes and her current UTI, the air in her bladder is most likely due to her current infection and would be expected to resolve with appropriate culture specific antibiotic therapy for a minimum of 7-10 days. I would recommend she followup as an outpatient to recheck her UA to ensure resolution and to consider cystoscopy to absolutely rule out any possible fistula or other complicating factor.   I do not have a urologic explanation  for her abdominal/right flank pain complaints or diarrhea and I think these symptoms are likely unrelated to her cystitis.  Moody Bruins. MD

## 2012-08-17 NOTE — H&P (Signed)
Joy Butler is an 61 y.o. female.   Patient was seen and examined on August 17, 2012 at 5:30 AM. PCP - patient just moved from [place] and presently has no one in [place]. Chief Complaint: Abdominal pain. HPI: 61 year-old female history of diabetes mellitus type 2, hypertension, history of embolism was not taking her medications except for antihypertensives for last 3 months presents to the ER because of abdominal pain and diarrhea. Patient states that she's been having diffuse abdominal pain crampy in nature and repeated episodes of diarrhea. Denies any nausea vomiting. Denies any recent use of antibiotics or hospitalization. In the ER CT abdomen pelvis shows air in the urinary bladder and UA is compatible with UTI. Patient otherwise denies any chest pain or shortness of breath. Feels weak and feels dizzy when she walks.  Past Medical History  Diagnosis Date  . Diabetes mellitus   . Hypertension   . Pulmonary embolism     Past Surgical History  Procedure Date  . Abdominal hysterectomy     History reviewed. No pertinent family history. Social History:  reports that she has been smoking.  She does not have any smokeless tobacco history on file. She reports that she does not drink alcohol or use illicit drugs.  Allergies:  Allergies  Allergen Reactions  . Sulfa Antibiotics Rash  . Sulfur Rash     (Not in a hospital admission)  Results for orders placed during the hospital encounter of 08/16/12 (from the past 48 hour(s))  URINALYSIS, ROUTINE W REFLEX MICROSCOPIC     Status: Abnormal   Collection Time   08/16/12  8:09 PM      Component Value Range Comment   Color, Urine YELLOW  YELLOW    APPearance CLOUDY (*) CLEAR    Specific Gravity, Urine 1.027  1.005 - 1.030    pH 6.0  5.0 - 8.0    Glucose, UA 250 (*) NEGATIVE mg/dL    Hgb urine dipstick MODERATE (*) NEGATIVE    Bilirubin Urine SMALL (*) NEGATIVE    Ketones, ur NEGATIVE  NEGATIVE mg/dL    Protein, ur >161 (*)  NEGATIVE mg/dL    Urobilinogen, UA 0.2  0.0 - 1.0 mg/dL    Nitrite NEGATIVE  NEGATIVE    Leukocytes, UA NEGATIVE  NEGATIVE   URINE MICROSCOPIC-ADD ON     Status: Abnormal   Collection Time   08/16/12  8:09 PM      Component Value Range Comment   Squamous Epithelial / LPF FEW (*) RARE    WBC, UA 3-6  <3 WBC/hpf    RBC / HPF 7-10  <3 RBC/hpf    Bacteria, UA MANY (*) RARE    Casts HYALINE CASTS (*) NEGATIVE   CBC WITH DIFFERENTIAL     Status: Abnormal   Collection Time   08/16/12  8:30 PM      Component Value Range Comment   WBC 9.0  4.0 - 10.5 K/uL    RBC 5.38 (*) 3.87 - 5.11 MIL/uL    Hemoglobin 15.2 (*) 12.0 - 15.0 g/dL    HCT 09.6  04.5 - 40.9 %    MCV 83.5  78.0 - 100.0 fL    MCH 28.3  26.0 - 34.0 pg    MCHC 33.9  30.0 - 36.0 g/dL    RDW 81.1  91.4 - 78.2 %    Platelets 326  150 - 400 K/uL    Neutrophils Relative 59  43 - 77 %  Neutro Abs 5.4  1.7 - 7.7 K/uL    Lymphocytes Relative 30  12 - 46 %    Lymphs Abs 2.7  0.7 - 4.0 K/uL    Monocytes Relative 7  3 - 12 %    Monocytes Absolute 0.7  0.1 - 1.0 K/uL    Eosinophils Relative 3  0 - 5 %    Eosinophils Absolute 0.3  0.0 - 0.7 K/uL    Basophils Relative 1  0 - 1 %    Basophils Absolute 0.1  0.0 - 0.1 K/uL   COMPREHENSIVE METABOLIC PANEL     Status: Abnormal   Collection Time   08/16/12  8:30 PM      Component Value Range Comment   Sodium 141  135 - 145 mEq/L    Potassium 3.1 (*) 3.5 - 5.1 mEq/L    Chloride 101  96 - 112 mEq/L    CO2 31  19 - 32 mEq/L    Glucose, Bld 191 (*) 70 - 99 mg/dL    BUN 9  6 - 23 mg/dL    Creatinine, Ser 0.98  0.50 - 1.10 mg/dL    Calcium 9.9  8.4 - 11.9 mg/dL    Total Protein 7.3  6.0 - 8.3 g/dL    Albumin 2.7 (*) 3.5 - 5.2 g/dL    AST 13  0 - 37 U/L    ALT 13  0 - 35 U/L    Alkaline Phosphatase 128 (*) 39 - 117 U/L    Total Bilirubin 0.3  0.3 - 1.2 mg/dL    GFR calc non Af Amer 89 (*) >90 mL/min    GFR calc Af Amer >90  >90 mL/min   GLUCOSE, CAPILLARY     Status: Abnormal    Collection Time   08/16/12  8:35 PM      Component Value Range Comment   Glucose-Capillary 198 (*) 70 - 99 mg/dL   LIPASE, BLOOD     Status: Normal   Collection Time   08/17/12 12:57 AM      Component Value Range Comment   Lipase 45  11 - 59 U/L   LACTIC ACID, PLASMA     Status: Normal   Collection Time   08/17/12 12:57 AM      Component Value Range Comment   Lactic Acid, Venous 1.4  0.5 - 2.2 mmol/L    Ct Head Wo Contrast  08/17/2012  *RADIOLOGY REPORT*  Clinical Data: Falls, weakness.  CT HEAD WITHOUT CONTRAST  Technique:  Contiguous axial images were obtained from the base of the skull through the vertex without contrast.  Comparison: None.  Findings: Periventricular and subcortical white matter hypodensities are most in keeping with chronic microangiopathic change. There is no evidence for acute hemorrhage, hydrocephalus, mass lesion, or abnormal extra-axial fluid collection.  No definite CT evidence for acute infarction.  The visualized paranasal sinuses and mastoid air cells are predominately clear.  No displaced calvarial fracture.  IMPRESSION: White matter changes as above.  No definite acute intracranial abnormality.   Original Report Authenticated By: Waneta Martins, M.D.    Ct Abdomen Pelvis W Contrast  08/17/2012  *RADIOLOGY REPORT*  Clinical Data: Abdominal pain  CT ABDOMEN AND PELVIS WITH CONTRAST  Technique:  Multidetector CT imaging of the abdomen and pelvis was performed following the standard protocol during bolus administration of intravenous contrast.  Contrast: OMNIPAQUE IOHEXOL 300 MG/ML  SOLN  Comparison: None.  Findings: There is significant streak artifact secondary  to patient contact with the gantry.  Minimal dependent scarring versus atelectasis, right greater than left.  Heart size upper normal.  No pleural or pericardial effusion.  Low attenuation of the liver is nonspecific post contrast however suggests fatty infiltration.  Absent gallbladder.  No biliary  ductal dilatation.  Unremarkable spleen, pancreas, adrenal glands.  Nonobstructing renal stones versus vascular calcifications.  Too small further characterize hypodensities within the kidneys. No hydronephrosis or hydroureter.  No bowel obstruction.  No CT evidence for colitis.  Normal appendix.  No free intraperitoneal air or fluid.  No lymphadenopathy.  Circumaortic left renal vein. There is scattered atherosclerotic calcification of the aorta and its branches. No aneurysmal dilatation.  Decompressed bladder with air non dependently. Diminutive or absent uterus.  No adnexal mass.  Multilevel degenerative changes of the imaged spine. No acute or aggressive appearing osseous lesion.  IMPRESSION: Air within the bladder is nonspecific.  Correlate with recent history of instrumentation. In the absence of instrumentation, differential would include fistulous communication with bowel.  Hepatic steatosis.  Nonobstructing renal stones versus vascular calcifications.   Original Report Authenticated By: Waneta Martins, M.D.    Dg Chest Portable 1 View  08/16/2012  *RADIOLOGY REPORT*  Clinical Data: Weakness, falls.  PORTABLE CHEST - 1 VIEW  Comparison: None.  Findings: Heart size upper normal to mildly enlarged. Right hemidiaphragm mildly elevated with mild lung base opacity. Otherwise, no focal consolidation, pleural effusion, or pneumothorax.  No acute osseous finding.  IMPRESSION: Heart size upper normal.  Minimal lung base opacity, likely atelectasis.   Original Report Authenticated By: Waneta Martins, M.D.     Review of Systems  Constitutional: Negative.   HENT: Negative.   Respiratory: Negative.   Cardiovascular: Negative.   Gastrointestinal: Positive for abdominal pain and diarrhea.  Genitourinary: Negative.   Musculoskeletal: Negative.   Skin: Negative.   Neurological: Negative.   Endo/Heme/Allergies: Negative.   Psychiatric/Behavioral: Negative.     Blood pressure 159/81, pulse 78,  temperature 98.1 F (36.7 C), temperature source Oral, resp. rate 20, SpO2 93.00%. Physical Exam  Constitutional: She is oriented to person, place, and time. She appears well-developed and well-nourished. No distress.  HENT:  Head: Normocephalic and atraumatic.  Right Ear: External ear normal.  Left Ear: External ear normal.  Nose: Nose normal.  Mouth/Throat: Oropharynx is clear and moist. No oropharyngeal exudate.  Eyes: Conjunctivae are normal. Pupils are equal, round, and reactive to light. Right eye exhibits no discharge. Left eye exhibits no discharge. No scleral icterus.  Neck: Normal range of motion.  Cardiovascular: Normal rate and regular rhythm.   Respiratory: Effort normal and breath sounds normal. No respiratory distress. She has no wheezes. She has no rales.  GI: Soft. Bowel sounds are normal. She exhibits no distension. There is no tenderness. There is no rebound and no guarding.  Musculoskeletal: Normal range of motion. She exhibits no edema and no tenderness.  Neurological: She is alert and oriented to person, place, and time.       Moves all extremities.  Skin: Skin is warm and dry. She is not diaphoretic.  Psychiatric: Her behavior is normal.     Assessment/Plan #1. Abdominal pain with diarrhea - concerning for gastroenteritis. CT abdomen is unremarkable except for air in the urinary bladder. Check stool studies and C. difficile PCR. Gently hydrate. Pain relief medications. #2. UTI - patient has been started on ceftriaxone. Check urine culture. Continue ceftriaxone for now. Patient has air in the urinary bladder may consult urologist  for further opinion and advice. Patient denies any pnematuria. #3. Diabetes mellitus type 2 - patient has not taken her insulin for last 3 months. I have placed patient at this time on sliding-scale coverage not sure if she can eat well at this time given abdominal pain. Check hemoglobin A1c. #4. Hypertension - continue losartan. Hold HCTZ as  we are gently hydrating. #5. History of pulmonary embolism - patient was on Coumadin but has not taken it for last 3 months. Denies any chest pain or shortness of breath at this time. #6. Tobacco abuse - strongly advised to quit smoking.  CODE STATUS - full code.  Eduard Clos. 08/17/2012, 6:02 AM

## 2012-08-17 NOTE — ED Notes (Signed)
Patient transported to CT 

## 2012-08-17 NOTE — ED Provider Notes (Signed)
History     CSN: [id number]  Arrival date & time 08/16/12  1932   First MD Initiated Contact with Patient 08/16/12 2255      Chief Complaint  Patient presents with  . Weakness    (Consider location/radiation/quality/duration/timing/severity/associated sxs/prior treatment) HPI HX per PT and her husband. R lower back and ABD pain today with multiple falls, denies any unilateral weakness or numbness to me- nurses notes noted. 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. Pain is sharp and worse with movement. abd pain is all over. No N/V, some diarrhea today no blood in stools. No F/C. No trauma. Has h/o DM and PE and has been off of her medications for the last 3 months. No CP or SOB.  Past Medical History  Diagnosis Date  . Diabetes mellitus   . Hypertension   . Pulmonary embolism     Past Surgical History  Procedure Date  . Abdominal hysterectomy     No family history on file.  History  Substance Use Topics  . Smoking status: Current Everyday Smoker  . Smokeless tobacco: Not on file  . Alcohol Use: No    OB History    Grav Para Term Preterm Abortions TAB SAB Ect Mult Living                  Review of Systems  Constitutional: Negative for fever and chills.  HENT: Negative for neck pain and neck stiffness.   Eyes: Negative for pain.  Respiratory: Negative for shortness of breath.   Cardiovascular: Negative for chest pain.  Gastrointestinal: Positive for abdominal pain and diarrhea. Negative for blood in stool.  Genitourinary: Negative for dysuria.  Musculoskeletal: Positive for back pain.  Skin: Negative for rash.  Neurological: Negative for headaches.  All other systems reviewed and are negative.    Allergies  Sulfa antibiotics and Sulfur  Home Medications   Current Outpatient Rx  Name Route Sig Dispense Refill  . INSULIN ASPART 100 UNIT/ML [place] SOLN Subcutaneous Inject into the skin 3 (three) times daily before meals.     Marland Kitchen LOSARTAN POTASSIUM-HCTZ 100-25 MG PO TABS Oral Take 1 tablet by mouth daily.      BP 159/81  Pulse 78  Temp 98.1 F (36.7 C) (Oral)  Resp 20  SpO2 93%  Physical Exam  Constitutional: She is oriented to person, place, and time. She appears well-developed and well-nourished.  HENT:  Head: Normocephalic and atraumatic.  Eyes: Conjunctivae and EOM are normal. Pupils are equal, round, and reactive to light.  Neck: Trachea normal. Neck supple. No thyromegaly present.  Cardiovascular: Normal rate, regular rhythm, S1 normal, S2 normal and normal pulses.     No systolic murmur is present   No diastolic murmur is present  Pulses:      Radial pulses are 2+ on the right side, and 2+ on the left side.  Pulmonary/Chest: Effort normal and breath sounds normal. She has no wheezes. She has no rhonchi. She has no rales. She exhibits no tenderness.  Abdominal: Soft. Normal appearance and bowel sounds are normal. There is no CVA tenderness and negative Murphy's sign.       Mod diffuse tenderness, no guarding or rebound  Musculoskeletal:       R paralumbar tenderness. no LE deficits with equal strengths and sensorium to light touch. BLE:s Calves nontender, no cords or erythema, negative Homans sign  Neurological: She is alert and oriented to person, place, and  time. She has normal strength. No cranial nerve deficit or sensory deficit. GCS eye subscore is 4. GCS verbal subscore is 5. GCS motor subscore is 6.  Skin: Skin is warm and dry. No rash noted. She is not diaphoretic.  Psychiatric: Her speech is normal.       Cooperative and appropriate    ED Course  Procedures (including critical care time)  Results for orders placed during the hospital encounter of 08/16/12  URINALYSIS, ROUTINE W REFLEX MICROSCOPIC      Component Value Range   Color, Urine YELLOW  YELLOW   APPearance CLOUDY (*) CLEAR   Specific Gravity, Urine 1.027  1.005 - 1.030   pH 6.0  5.0 - 8.0   Glucose, UA 250 (*) NEGATIVE  mg/dL   Hgb urine dipstick MODERATE (*) NEGATIVE   Bilirubin Urine SMALL (*) NEGATIVE   Ketones, ur NEGATIVE  NEGATIVE mg/dL   Protein, ur >161 (*) NEGATIVE mg/dL   Urobilinogen, UA 0.2  0.0 - 1.0 mg/dL   Nitrite NEGATIVE  NEGATIVE   Leukocytes, UA NEGATIVE  NEGATIVE  CBC WITH DIFFERENTIAL      Component Value Range   WBC 9.0  4.0 - 10.5 K/uL   RBC 5.38 (*) 3.87 - 5.11 MIL/uL   Hemoglobin 15.2 (*) 12.0 - 15.0 g/dL   HCT 09.6  04.5 - 40.9 %   MCV 83.5  78.0 - 100.0 fL   MCH 28.3  26.0 - 34.0 pg   MCHC 33.9  30.0 - 36.0 g/dL   RDW 81.1  91.4 - 78.2 %   Platelets 326  150 - 400 K/uL   Neutrophils Relative 59  43 - 77 %   Neutro Abs 5.4  1.7 - 7.7 K/uL   Lymphocytes Relative 30  12 - 46 %   Lymphs Abs 2.7  0.7 - 4.0 K/uL   Monocytes Relative 7  3 - 12 %   Monocytes Absolute 0.7  0.1 - 1.0 K/uL   Eosinophils Relative 3  0 - 5 %   Eosinophils Absolute 0.3  0.0 - 0.7 K/uL   Basophils Relative 1  0 - 1 %   Basophils Absolute 0.1  0.0 - 0.1 K/uL  COMPREHENSIVE METABOLIC PANEL      Component Value Range   Sodium 141  135 - 145 mEq/L   Potassium 3.1 (*) 3.5 - 5.1 mEq/L   Chloride 101  96 - 112 mEq/L   CO2 31  19 - 32 mEq/L   Glucose, Bld 191 (*) 70 - 99 mg/dL   BUN 9  6 - 23 mg/dL   Creatinine, Ser 9.56  0.50 - 1.10 mg/dL   Calcium 9.9  8.4 - 21.3 mg/dL   Total Protein 7.3  6.0 - 8.3 g/dL   Albumin 2.7 (*) 3.5 - 5.2 g/dL   AST 13  0 - 37 U/L   ALT 13  0 - 35 U/L   Alkaline Phosphatase 128 (*) 39 - 117 U/L   Total Bilirubin 0.3  0.3 - 1.2 mg/dL   GFR calc non Af Amer 89 (*) >90 mL/min   GFR calc Af Amer >90  >90 mL/min  GLUCOSE, CAPILLARY      Component Value Range   Glucose-Capillary 198 (*) 70 - 99 mg/dL  URINE MICROSCOPIC-ADD ON      Component Value Range   Squamous Epithelial / LPF FEW (*) RARE   WBC, UA 3-6  <3 WBC/hpf   RBC / HPF 7-10  <3  RBC/hpf   Bacteria, UA MANY (*) RARE   Casts HYALINE CASTS (*) NEGATIVE  LIPASE, BLOOD      Component Value Range   Lipase 45   11 - 59 U/L  LACTIC ACID, PLASMA      Component Value Range   Lactic Acid, Venous 1.4  0.5 - 2.2 mmol/L   Ct Head Wo Contrast  08/17/2012  *RADIOLOGY REPORT*  Clinical Data: Falls, weakness.  CT HEAD WITHOUT CONTRAST  Technique:  Contiguous axial images were obtained from the base of the skull through the vertex without contrast.  Comparison: None.  Findings: Periventricular and subcortical white matter hypodensities are most in keeping with chronic microangiopathic change. There is no evidence for acute hemorrhage, hydrocephalus, mass lesion, or abnormal extra-axial fluid collection.  No definite CT evidence for acute infarction.  The visualized paranasal sinuses and mastoid air cells are predominately clear.  No displaced calvarial fracture.  IMPRESSION: White matter changes as above.  No definite acute intracranial abnormality.   Original Report Authenticated By: Waneta Martins, M.D.    Ct Abdomen Pelvis W Contrast  08/17/2012  *RADIOLOGY REPORT*  Clinical Data: Abdominal pain  CT ABDOMEN AND PELVIS WITH CONTRAST  Technique:  Multidetector CT imaging of the abdomen and pelvis was performed following the standard protocol during bolus administration of intravenous contrast.  Contrast: OMNIPAQUE IOHEXOL 300 MG/ML  SOLN  Comparison: None.  Findings: There is significant streak artifact secondary to patient contact with the gantry.  Minimal dependent scarring versus atelectasis, right greater than left.  Heart size upper normal.  No pleural or pericardial effusion.  Low attenuation of the liver is nonspecific post contrast however suggests fatty infiltration.  Absent gallbladder.  No biliary ductal dilatation.  Unremarkable spleen, pancreas, adrenal glands.  Nonobstructing renal stones versus vascular calcifications.  Too small further characterize hypodensities within the kidneys. No hydronephrosis or hydroureter.  No bowel obstruction.  No CT evidence for colitis.  Normal appendix.  No free  intraperitoneal air or fluid.  No lymphadenopathy.  Circumaortic left renal vein. There is scattered atherosclerotic calcification of the aorta and its branches. No aneurysmal dilatation.  Decompressed bladder with air non dependently. Diminutive or absent uterus.  No adnexal mass.  Multilevel degenerative changes of the imaged spine. No acute or aggressive appearing osseous lesion.  IMPRESSION: Air within the bladder is nonspecific.  Correlate with recent history of instrumentation. In the absence of instrumentation, differential would include fistulous communication with bowel.  Hepatic steatosis.  Nonobstructing renal stones versus vascular calcifications.   Original Report Authenticated By: Waneta Martins, M.D.    Dg Chest Portable 1 View  08/16/2012  *RADIOLOGY REPORT*  Clinical Data: Weakness, falls.  PORTABLE CHEST - 1 VIEW  Comparison: None.  Findings: Heart size upper normal to mildly enlarged. Right hemidiaphragm mildly elevated with mild lung base opacity. Otherwise, no focal consolidation, pleural effusion, or pneumothorax.  No acute osseous finding.  IMPRESSION: Heart size upper normal.  Minimal lung base opacity, likely atelectasis.   Original Report Authenticated By: Waneta Martins, M.D.     IVFs. IV rocephin and U cx. IV morphine and zofran.   MDM   VS and nursing notes reviewed. Evaluated with labs, UA and imaging as above. MEd c/s for admit. D/w Dr Toniann Fail for admit - telemetry bed request placed.         Sunnie Nielsen, MD 08/17/12 [id number]

## 2012-08-17 NOTE — Progress Notes (Signed)
Notified PA on call that pt's BP was 175/79. No new orders given for BP med. Told to call back if SBP > 180. Will continue to monitor.  Alfonso Ellis, RN

## 2012-08-17 NOTE — Care Management Note (Unsigned)
    Page 1 of 1   08/17/2012     11:10:35 AM   CARE MANAGEMENT NOTE 08/17/2012  Patient:  Joy Butler, Joy Butler   Account Number:  [id number]  Date Initiated:  08/17/2012  Documentation initiated by:  SIMMONS,Lenoir Facchini  Subjective/Objective Assessment:   ADMITTED WITH ABD PAIN; LIVES AT HOME ALONE; WAS IPTA.     Action/Plan:   DISCHARGE PLANNING INITIATED.   Anticipated DC Date:  08/18/2012   Anticipated DC Plan:  HOME/SELF CARE      DC Planning Services  CM consult      Choice offered to / List presented to:             Status of service:  In process, will continue to follow Medicare Important Message given?   (If response is "NO", the following Medicare IM given date fields will be blank) Date Medicare IM given:   Date Additional Medicare IM given:    Discharge Disposition:    Per UR Regulation:  Reviewed for med. necessity/level of care/duration of stay  If discussed at Long Length of Stay Meetings, dates discussed:    Comments:  08/17/12  1109  Annayah Worthley SIMMONS RN, BSN [phone] NCM WILL FOLLOW.

## 2012-08-17 NOTE — ED Provider Notes (Signed)
Medical screening examination/treatment/procedure(s) were performed by non-physician practitioner and as supervising physician I was immediately available for consultation/collaboration.  Luiz Blare MD   Luiz Blare, MD 08/17/12 2100

## 2012-08-17 NOTE — Progress Notes (Signed)
PCP: Sheila Oats, MD  Brief HPI: 61 year-old female history of diabetes mellitus type 2, hypertension, history of embolism was not taking her medications except for antihypertensives for last 3 months presented to the ER because of abdominal pain and diarrhea. Patient stated that she's been having diffuse abdominal pain crampy in nature and repeated episodes of diarrhea. Denied any nausea vomiting. Denied any recent use of antibiotics or hospitalization. In the ER, CT abdomen pelvis shows air in the urinary bladder and UA is compatible with UTI. Patient otherwise denied any chest pain or shortness of breath. Felt weak and feels dizzy when she walks.  Past medical history:  Past Medical History  Diagnosis Date  . Diabetes mellitus   . Hypertension   . Pulmonary embolism     Consultants: Urology  Procedures: None yet  Patient admitted earlier today. H&P Reviewed.  Subjective: Patient was sleeping. Upon waking up she started complaining of abdominal pain. Diffusely. 7/10. No nausea or vomiting. Admits to passing air while urinating. No recent urinary bladder catheterization.   Objective: Vital signs in last 24 hours: Temp:  [97.5 F (36.4 C)-98.5 F (36.9 C)] 97.5 F (36.4 C) (08/28 0752) Pulse Rate:  [74-97] 76  (08/28 0752) Resp:  [16-21] 18  (08/28 0752) BP: (159-177)/(79-98) 165/90 mmHg (08/28 1000) SpO2:  [88 %-100 %] 100 % (08/28 1000) Weight change:     Intake/Output from previous day: 08/27 0701 - 08/28 0700 In: 1000 [I.V.:1000] Out: -  Intake/Output this shift:    General appearance: alert, cooperative, appears stated age, no distress and moderately obese Head: Normocephalic, without obvious abnormality, atraumatic Resp: clear to auscultation bilaterally Cardio: regular rate and rhythm, S1, S2 normal, no murmur, click, rub or gallop GI: soft, tender diffusely. No rebound rigidity or guarding. bowel sounds normal; no masses,  no organomegaly Extremities:  extremities normal, atraumatic, no cyanosis or edema Pulses: 2+ and symmetric Skin: Skin color, texture, turgor normal. No rashes or lesions Lymph nodes: Cervical, supraclavicular, and axillary nodes normal. Neurologic: Moving all extremities. No focal deficits.  Lab Results:  Basename 08/17/12 0938 08/16/12 2030  WBC 7.7 9.0  HGB 13.4 15.2*  HCT 41.0 44.9  PLT 269 326   BMET  Basename 08/16/12 2030  NA 141  K 3.1*  CL 101  CO2 31  GLUCOSE 191*  BUN 9  CREATININE 0.76  CALCIUM 9.9  ALT 13    Studies/Results: Ct Head Wo Contrast  08/17/2012  *RADIOLOGY REPORT*  Clinical Data: Falls, weakness.  CT HEAD WITHOUT CONTRAST  Technique:  Contiguous axial images were obtained from the base of the skull through the vertex without contrast.  Comparison: None.  Findings: Periventricular and subcortical white matter hypodensities are most in keeping with chronic microangiopathic change. There is no evidence for acute hemorrhage, hydrocephalus, mass lesion, or abnormal extra-axial fluid collection.  No definite CT evidence for acute infarction.  The visualized paranasal sinuses and mastoid air cells are predominately clear.  No displaced calvarial fracture.  IMPRESSION: White matter changes as above.  No definite acute intracranial abnormality.   Original Report Authenticated By: Waneta Martins, M.D.    Ct Abdomen Pelvis W Contrast  08/17/2012  *RADIOLOGY REPORT*  Clinical Data: Abdominal pain  CT ABDOMEN AND PELVIS WITH CONTRAST  Technique:  Multidetector CT imaging of the abdomen and pelvis was performed following the standard protocol during bolus administration of intravenous contrast.  Contrast: OMNIPAQUE IOHEXOL 300 MG/ML  SOLN  Comparison: None.  Findings: There is significant  streak artifact secondary to patient contact with the gantry.  Minimal dependent scarring versus atelectasis, right greater than left.  Heart size upper normal.  No pleural or pericardial effusion.  Low  attenuation of the liver is nonspecific post contrast however suggests fatty infiltration.  Absent gallbladder.  No biliary ductal dilatation.  Unremarkable spleen, pancreas, adrenal glands.  Nonobstructing renal stones versus vascular calcifications.  Too small further characterize hypodensities within the kidneys. No hydronephrosis or hydroureter.  No bowel obstruction.  No CT evidence for colitis.  Normal appendix.  No free intraperitoneal air or fluid.  No lymphadenopathy.  Circumaortic left renal vein. There is scattered atherosclerotic calcification of the aorta and its branches. No aneurysmal dilatation.  Decompressed bladder with air non dependently. Diminutive or absent uterus.  No adnexal mass.  Multilevel degenerative changes of the imaged spine. No acute or aggressive appearing osseous lesion.  IMPRESSION: Air within the bladder is nonspecific.  Correlate with recent history of instrumentation. In the absence of instrumentation, differential would include fistulous communication with bowel.  Hepatic steatosis.  Nonobstructing renal stones versus vascular calcifications.   Original Report Authenticated By: Waneta Martins, M.D.    Dg Chest Portable 1 View  08/16/2012  *RADIOLOGY REPORT*  Clinical Data: Weakness, falls.  PORTABLE CHEST - 1 VIEW  Comparison: None.  Findings: Heart size upper normal to mildly enlarged. Right hemidiaphragm mildly elevated with mild lung base opacity. Otherwise, no focal consolidation, pleural effusion, or pneumothorax.  No acute osseous finding.  IMPRESSION: Heart size upper normal.  Minimal lung base opacity, likely atelectasis.   Original Report Authenticated By: Waneta Martins, M.D.     Medications:  Scheduled:   . cefTRIAXone (ROCEPHIN)  IV  1 g Intravenous Once  . cefTRIAXone (ROCEPHIN)  IV  1 g Intravenous Q24H  .  HYDROmorphone (DILAUDID) injection  1 mg Intravenous Once  . insulin aspart  0-9 Units Subcutaneous TID WC  . losartan  100 mg Oral Daily   .  morphine injection  4 mg Intravenous Once  .  morphine injection  4 mg Intravenous Once  .  morphine injection  4 mg Intravenous Once  . ondansetron  4 mg Intravenous Once  . ondansetron  4 mg Intravenous Once  . sodium chloride  1,000 mL Intravenous Once  . sodium chloride  3 mL Intravenous Q12H   Continuous:   . 0.9 % NaCl with KCl 20 mEq / L 100 mL/hr at 08/17/12 0825  . DISCONTD: sodium chloride 125 mL/hr at 08/17/12 0053   WUJ:[id number], acetaminophen, iohexol, morphine injection, ondansetron (ZOFRAN) IV, ondansetron  Assessment/Plan:  Principal Problem:  *Abdominal pain Active Problems:  Diarrhea  HTN (hypertension)  Diabetes mellitus  Personal history of PE (pulmonary embolism)    Abdominal pain with diarrhea Etiology unclear. Concerning for gastroenteritis. CT abdomen is unremarkable except for air in the urinary bladder. Check stool studies and C. difficile PCR. Gently hydrate. Pain relief medications. Unclear if her symptoms are urological. Await urology opinion. Will get records from her PCP. She reports colonoscopy within last 2 years. May need to consult GI if she doesn't improve.  Abnormal UA/Questionable UTI with air in the Urinary bladder Will consult Urology as patient does not give history of recent catheterization.  Patient has been started on ceftriaxone for abnormal UA. Check urine culture.   History of Diabetes mellitus type 2 Patient has not taken her insulin for last 3 months. Continue sliding-scale coverage. Check hemoglobin A1c.   History of Hypertension Continue  losartan. Hold HCTZ as we are gently hydrating. Watch for drop in BP  History of pulmonary embolism Patient was on Coumadin but has not taken it for last 3 months. Denies any chest pain or shortness of breath at this time. Check PT/INR  Tobacco abuse Strongly advised to quit smoking.   Code Status Full Code  DVT Prophylaxis SCD's  PT/OT   LOS: 1 day    [facility]  [facility] Pager [phone] 08/17/2012, 11:22 AM

## 2012-08-17 NOTE — ED Notes (Signed)
Report called to [facility] RN RM  [phone]

## 2012-08-17 NOTE — ED Notes (Addendum)
Pt reports she takes Lantus 55 units at HS, Novolog 20 units 20 units 4x daily, Lorsartan-HCTZ 100-25 mg 1 tab daily, metoprolol 25 mg 1 tab daily and coumadin 5 mg daily. Pt is requesting 1 refill for her medication d/t just moving to [place] from [facility] and has is looking for a medical provider.

## 2012-08-17 NOTE — Progress Notes (Signed)
Pt nauseated and vomited. Emesis appeared brown/tea colored with no chunks. Gave pt zofran and relieved nausea.

## 2012-08-18 ENCOUNTER — Encounter (HOSPITAL_COMMUNITY): Payer: Self-pay | Admitting: General Surgery

## 2012-08-18 DIAGNOSIS — E119 Type 2 diabetes mellitus without complications: Secondary | ICD-10-CM | POA: Diagnosis not present

## 2012-08-18 DIAGNOSIS — R197 Diarrhea, unspecified: Secondary | ICD-10-CM | POA: Diagnosis not present

## 2012-08-18 DIAGNOSIS — R109 Unspecified abdominal pain: Secondary | ICD-10-CM | POA: Diagnosis not present

## 2012-08-18 DIAGNOSIS — I10 Essential (primary) hypertension: Secondary | ICD-10-CM | POA: Diagnosis not present

## 2012-08-18 LAB — COMPREHENSIVE METABOLIC PANEL
CO2: 27 mEq/L (ref 19–32)
Calcium: 8.4 mg/dL (ref 8.4–10.5)
Creatinine, Ser: 0.8 mg/dL (ref 0.50–1.10)
GFR calc Af Amer: 90 mL/min — ABNORMAL LOW (ref >90)
GFR calc non Af Amer: 77 mL/min — ABNORMAL LOW (ref >90)
Glucose, Bld: 137 mg/dL — ABNORMAL HIGH (ref 70–99)

## 2012-08-18 LAB — URINE CULTURE: Colony Count: 6000

## 2012-08-18 LAB — CBC
MCH: 27.4 pg (ref 26.0–34.0)
MCV: 83.3 fL (ref 78.0–100.0)
Platelets: 275 10*3/uL (ref 150–400)
RBC: 4.6 MIL/uL (ref 3.87–5.11)

## 2012-08-18 LAB — GLUCOSE, CAPILLARY: Glucose-Capillary: 142 mg/dL — ABNORMAL HIGH (ref 70–99)

## 2012-08-18 MED ORDER — LIVING WELL WITH DIABETES BOOK
Freq: Once | Status: AC
  Administered 2012-08-18: 13:00:00
  Filled 2012-08-18: qty 1

## 2012-08-18 MED ORDER — AMLODIPINE BESYLATE 5 MG PO TABS
5.0000 mg | ORAL_TABLET | Freq: Every day | ORAL | Status: DC
  Filled 2012-08-18: qty 1

## 2012-08-18 MED ORDER — HYDRALAZINE HCL 20 MG/ML IJ SOLN
10.0000 mg | Freq: Four times a day (QID) | INTRAMUSCULAR | Status: DC | PRN
  Filled 2012-08-18: qty 0.5

## 2012-08-18 MED ORDER — POTASSIUM CHLORIDE CRYS ER 20 MEQ PO TBCR
40.0000 meq | EXTENDED_RELEASE_TABLET | Freq: Once | ORAL | Status: AC
  Administered 2012-08-18: 40 meq via ORAL
  Filled 2012-08-18: qty 2

## 2012-08-18 MED ORDER — PANTOPRAZOLE SODIUM 40 MG PO TBEC
40.0000 mg | DELAYED_RELEASE_TABLET | Freq: Two times a day (BID) | ORAL | Status: DC
  Administered 2012-08-19 – 2012-08-21 (×5): 40 mg via ORAL
  Filled 2012-08-18 (×5): qty 1

## 2012-08-18 MED ORDER — AMLODIPINE BESYLATE 10 MG PO TABS
10.0000 mg | ORAL_TABLET | Freq: Every day | ORAL | Status: DC
  Administered 2012-08-18 – 2012-08-21 (×4): 10 mg via ORAL
  Filled 2012-08-18 (×4): qty 1

## 2012-08-18 MED ORDER — SODIUM CHLORIDE 0.9 % IV SOLN: INTRAVENOUS | Status: DC

## 2012-08-18 NOTE — Evaluation (Signed)
Occupational Therapy Evaluation Patient Details Name: Joy Butler MRN: [id number] DOB: 17-Mar-1951 Today's Date: 08/18/2012 Time: 4696-2952 OT Time Calculation (min): 16 min  OT Assessment / Plan / Recommendation Clinical Impression  Pt admitted with abdominal pain and positive UTI. Evaluation limited secondary to pt with BP >180 systolic in standing. Pt with decreased balance during static standing requiring increased assistance. Pt will benefit from skilled OT in the acute setting to maximize I with ADL and ADL mobility prior to d/c.     OT Assessment  Patient needs continued OT Services    Follow Up Recommendations  No OT follow up;Supervision/Assistance - 24 hour    Barriers to Discharge      Equipment Recommendations   (may need 3n1 and tub/shower seat with back pending progress)    Recommendations for Other Services    Frequency  Min 2X/week    Precautions / Restrictions Precautions Precautions: Fall Restrictions Weight Bearing Restrictions: No   Pertinent Vitals/Pain BP in sitting was 183/72. Upon standing, BP increased to 185/79. RN notified and activity seized.     ADL  Eating/Feeding: Performed;Independent Where Assessed - Eating/Feeding: Chair Upper Body Dressing: Simulated;Set up Where Assessed - Upper Body Dressing: Supported sitting Lower Body Dressing: Performed;Minimal assistance Where Assessed - Lower Body Dressing: Supported sit to stand Toilet Transfer: Mining engineer Method: Sit to Barista:  (from chair with armrests) Toileting - Clothing Manipulation and Hygiene: Simulated;Moderate assistance Where Assessed - Toileting Clothing Manipulation and Hygiene: Standing Equipment Used: Gait belt Transfers/Ambulation Related to ADLs: Min a for sit to stand from chair with armrests. Mod A for static standing balance. Unable to test ambulation secondary to incr BP- RN made aware    OT Diagnosis:  Generalized weakness  OT Problem List: Decreased activity tolerance;Impaired balance (sitting and/or standing);Decreased knowledge of use of DME or AE OT Treatment Interventions: Self-care/ADL training;DME and/or AE instruction;Patient/family education;Balance training   OT Goals Acute Rehab OT Goals OT Goal Formulation: With patient Time For Goal Achievement: 08/25/12 Potential to Achieve Goals: Good ADL Goals Pt Will Perform Grooming: with modified independence;Standing at sink ADL Goal: Grooming - Progress: Goal set today Pt Will Perform Upper Body Bathing: with modified independence;Sitting in shower ADL Goal: Upper Body Bathing - Progress: Goal set today Pt Will Perform Lower Body Bathing: with supervision;Sit to stand in shower ADL Goal: Lower Body Bathing - Progress: Goal set today Pt Will Perform Upper Body Dressing: Independently;Sitting, bed;Sitting, chair ADL Goal: Upper Body Dressing - Progress: Goal set today Pt Will Perform Lower Body Dressing: with modified independence;Sit to stand from bed;Sit to stand from chair ADL Goal: Lower Body Dressing - Progress: Goal set today Pt Will Transfer to Toilet: with modified independence;Ambulation;with DME ADL Goal: Toilet Transfer - Progress: Goal set today Pt Will Perform Toileting - Clothing Manipulation: Independently;Standing ADL Goal: Toileting - Clothing Manipulation - Progress: Goal set today Pt Will Perform Tub/Shower Transfer: Tub transfer;Ambulation;with DME ADL Goal: Tub/Shower Transfer - Progress: Goal set today  Visit Information  Last OT Received On: 08/18/12 Assistance Needed: +1 PT/OT Co-Evaluation/Treatment: Yes    Subjective Data  Subjective: I'm feeling a little better today Patient Stated Goal: Return home with family   Prior Functioning  Vision/Perception  Home Living Lives With: Spouse;Family Available Help at Discharge: Family;Available 24 hours/day Type of Home: House Home Access: Stairs to  enter [employer] of Steps: 1 Entrance Stairs-Rails: Right Home Layout: One level Bathroom Shower/Tub: Engineer, manufacturing systems: Standard Home Adaptive  Equipment: Straight cane Prior Function Level of Independence: Independent with assistive device(s) Able to Take Stairs?: Reciprically Driving: No Vocation: On disability Communication Communication: No difficulties Dominant Hand: Right      Cognition  Overall Cognitive Status: Appears within functional limits for tasks assessed/performed Arousal/Alertness: Awake/alert Orientation Level: Appears intact for tasks assessed Behavior During Session: [facility] for tasks performed    Extremity/Trunk Assessment Right Upper Extremity Assessment RUE ROM/Strength/Tone: WFL for tasks assessed RUE Sensation: WFL - Light Touch RUE Coordination: WFL - gross/fine motor Left Upper Extremity Assessment LUE ROM/Strength/Tone: WFL for tasks assessed LUE Sensation: WFL - Light Touch LUE Coordination: WFL - gross/fine motor Right Lower Extremity Assessment RLE ROM/Strength/Tone: Within functional levels RLE Sensation: WFL - Light Touch Left Lower Extremity Assessment LLE ROM/Strength/Tone: Within functional levels LLE Sensation: WFL - Light Touch   Mobility  Shoulder Instructions  Bed Mobility Bed Mobility: Not assessed Transfers Sit to Stand: 4: Min assist;With upper extremity assist;From chair/3-in-1 Stand to Sit: 4: Min assist;With upper extremity assist;To chair/3-in-1 Details for Transfer Assistance: VC for hand placement. Pt required increased assistance through UEs into standing. Min assist for control.        Exercise     Balance Balance Balance Assessed: Yes Static Standing Balance Static Standing - Balance Support: Bilateral upper extremity supported Static Standing - Level of Assistance: 3: Mod assist Static Standing - Comment/# of Minutes: Pt standing at chair with BUE support. Pt required mod assist as she  was unsteady both anteriorly/posteriorly and laterally    End of Session OT - End of Session Equipment Utilized During Treatment: Gait belt Activity Tolerance:  (limited by incr BP) Patient left: in chair;with call bell/phone within reach Nurse Communication: Mobility status  GO Functional Assessment Tool Used: clinical judgement Functional Limitation: Self care Self Care Current Status ([id number]): At least 20 percent but less than 40 percent impaired, limited or restricted Self Care Goal Status (W2956): At least 1 percent but less than 20 percent impaired, limited or restricted   Nevada Kirchner 08/18/2012, 3:10 PM

## 2012-08-18 NOTE — Consult Note (Signed)
No acute surgical indications.  Needs further GI/ GU work-up - cystoscopy, possible colonoscopy.  Contact us again if any surgical indications arise.  Wilmon Arms. Corliss Skains, MD, [facility]  08/18/2012 4:03 PM

## 2012-08-18 NOTE — Consult Note (Signed)
Joy Butler 1951/08/08  [id number].   Primary Care MD: none Requesting MD: Dr. Osvaldo Shipper Chief Complaint/Reason for Consult: abdominal pain HPI: This is a 61 yo black female who presented to [facility] with abdominal pain several days ago.  She was found to have a UTI, which is her second one this year.  She states that she passes air with urination, but when she drinks water it makes this stop.  She had a CT scan as well due to abdominal pain.  This revealed air in her bladder, but no other findings.  Her appendix was normal.  Urology was consulted and felt the air in her bladder was likely secondary to her UTI.  She denies any "dirtiness" to her urine.  She states it is clear and no evidence of stool in her urine.  She states she has had this abdominal pain for about 2-3 weeks.  She gets diarrhea after she eats.  She denies fevers or chills.  No nausea or vomiting.  Her pain radiates to her right flank.  She states that movement makes her pain better, but lying still makes it worse.  We have been asked to evaluate to rule out any surgical issues.  Review of Systems:  Please see HPI, otherwise all other systems reviewed and are negative.  History reviewed. No pertinent family history.  Past Medical History  Diagnosis Date  . Diabetes mellitus   . Hypertension   . Pulmonary embolism   . UTI (urinary tract infection)     Past Surgical History  Procedure Date  . Abdominal hysterectomy   . Cholecystectomy   Carpal tunnel release  Social History:  reports that she has been smoking Cigarettes.  She has a 2.5 pack-year smoking history. She does not have any smokeless tobacco history on file. She reports that she does not drink alcohol or use illicit drugs.  Allergies:  Allergies  Allergen Reactions  . Sulfa Antibiotics Rash    Medications Prior to Admission  Medication Sig Dispense Refill  . insulin aspart (NOVOLOG) 100 UNIT/ML injection Inject into the skin 3 (three) times daily  before meals.      Marland Kitchen losartan-hydrochlorothiazide (HYZAAR) 100-25 MG per tablet Take 1 tablet by mouth daily.        Blood pressure 151/80, pulse 81, temperature 98.1 F (36.7 C), temperature source Oral, resp. rate 18, height 5\' 9"  (1.753 m), SpO2 95.00%. Physical Exam: General: obese black female who is sitting up in her chair in NAD HEENT: head is normocephalic, atraumatic.  Sclera are noninjected.  PERRL.  Ears and nose without any masses or lesions.  Mouth is pink and moist Heart: regular, rate, and rhythm.  Normal s1,s2. No obvious murmurs, gallops, or rubs noted.  Palpable radial and pedal pulses bilaterally Lungs: CTAB, no wheezes, rhonchi, or rales noted.  Respiratory effort nonlabored Abd: soft, mildly tender in RLQ and RUQ.  No CVA tenderness, ND, +BS, no masses, hernias, or organomegaly MS: all 4 extremities are symmetrical with no cyanosis, clubbing, or edema. Skin: warm and dry with no masses, lesions, or rashes Psych: A&Ox3 with an appropriate affect.    Results for orders placed during the hospital encounter of 08/16/12 (from the past 48 hour(s))  URINALYSIS, ROUTINE W REFLEX MICROSCOPIC     Status: Abnormal   Collection Time   08/16/12  8:09 PM      Component Value Range Comment   Color, Urine YELLOW  YELLOW    APPearance CLOUDY (*) CLEAR  Specific Gravity, Urine 1.027  1.005 - 1.030    pH 6.0  5.0 - 8.0    Glucose, UA 250 (*) NEGATIVE mg/dL    Hgb urine dipstick MODERATE (*) NEGATIVE    Bilirubin Urine SMALL (*) NEGATIVE    Ketones, ur NEGATIVE  NEGATIVE mg/dL    Protein, ur >295 (*) NEGATIVE mg/dL    Urobilinogen, UA 0.2  0.0 - 1.0 mg/dL    Nitrite NEGATIVE  NEGATIVE    Leukocytes, UA NEGATIVE  NEGATIVE   URINE MICROSCOPIC-ADD ON     Status: Abnormal   Collection Time   08/16/12  8:09 PM      Component Value Range Comment   Squamous Epithelial / LPF FEW (*) RARE    WBC, UA 3-6  <3 WBC/hpf    RBC / HPF 7-10  <3 RBC/hpf    Bacteria, UA MANY (*) RARE     Casts HYALINE CASTS (*) NEGATIVE   CBC WITH DIFFERENTIAL     Status: Abnormal   Collection Time   08/16/12  8:30 PM      Component Value Range Comment   WBC 9.0  4.0 - 10.5 K/uL    RBC 5.38 (*) 3.87 - 5.11 MIL/uL    Hemoglobin 15.2 (*) 12.0 - 15.0 g/dL    HCT 62.1  30.8 - 65.7 %    MCV 83.5  78.0 - 100.0 fL    MCH 28.3  26.0 - 34.0 pg    MCHC 33.9  30.0 - 36.0 g/dL    RDW 84.6  96.2 - 95.2 %    Platelets 326  150 - 400 K/uL    Neutrophils Relative 59  43 - 77 %    Neutro Abs 5.4  1.7 - 7.7 K/uL    Lymphocytes Relative 30  12 - 46 %    Lymphs Abs 2.7  0.7 - 4.0 K/uL    Monocytes Relative 7  3 - 12 %    Monocytes Absolute 0.7  0.1 - 1.0 K/uL    Eosinophils Relative 3  0 - 5 %    Eosinophils Absolute 0.3  0.0 - 0.7 K/uL    Basophils Relative 1  0 - 1 %    Basophils Absolute 0.1  0.0 - 0.1 K/uL   COMPREHENSIVE METABOLIC PANEL     Status: Abnormal   Collection Time   08/16/12  8:30 PM      Component Value Range Comment   Sodium 141  135 - 145 mEq/L    Potassium 3.1 (*) 3.5 - 5.1 mEq/L    Chloride 101  96 - 112 mEq/L    CO2 31  19 - 32 mEq/L    Glucose, Bld 191 (*) 70 - 99 mg/dL    BUN 9  6 - 23 mg/dL    Creatinine, Ser 8.41  0.50 - 1.10 mg/dL    Calcium 9.9  8.4 - 32.4 mg/dL    Total Protein 7.3  6.0 - 8.3 g/dL    Albumin 2.7 (*) 3.5 - 5.2 g/dL    AST 13  0 - 37 U/L    ALT 13  0 - 35 U/L    Alkaline Phosphatase 128 (*) 39 - 117 U/L    Total Bilirubin 0.3  0.3 - 1.2 mg/dL    GFR calc non Af Amer 89 (*) >90 mL/min    GFR calc Af Amer >90  >90 mL/min   GLUCOSE, CAPILLARY     Status: Abnormal  Collection Time   08/16/12  8:35 PM      Component Value Range Comment   Glucose-Capillary 198 (*) 70 - 99 mg/dL   LIPASE, BLOOD     Status: Normal   Collection Time   08/17/12 12:57 AM      Component Value Range Comment   Lipase 45  11 - 59 U/L   LACTIC ACID, PLASMA     Status: Normal   Collection Time   08/17/12 12:57 AM      Component Value Range Comment   Lactic Acid, Venous 1.4   0.5 - 2.2 mmol/L   COMPREHENSIVE METABOLIC PANEL     Status: Abnormal   Collection Time   08/17/12  9:38 AM      Component Value Range Comment   Sodium 143  135 - 145 mEq/L    Potassium 3.2 (*) 3.5 - 5.1 mEq/L    Chloride 105  96 - 112 mEq/L    CO2 30  19 - 32 mEq/L    Glucose, Bld 181 (*) 70 - 99 mg/dL    BUN 8  6 - 23 mg/dL    Creatinine, Ser 5.36  0.50 - 1.10 mg/dL    Calcium 9.0  8.4 - 64.4 mg/dL    Total Protein 6.4  6.0 - 8.3 g/dL    Albumin 2.4 (*) 3.5 - 5.2 g/dL    AST 14  0 - 37 U/L    ALT 10  0 - 35 U/L    Alkaline Phosphatase 114  39 - 117 U/L    Total Bilirubin 0.2 (*) 0.3 - 1.2 mg/dL    GFR calc non Af Amer 70 (*) >90 mL/min    GFR calc Af Amer 81 (*) >90 mL/min   CBC WITH DIFFERENTIAL     Status: Abnormal   Collection Time   08/17/12  9:38 AM      Component Value Range Comment   WBC 7.7  4.0 - 10.5 K/uL    RBC 4.85  3.87 - 5.11 MIL/uL    Hemoglobin 13.4  12.0 - 15.0 g/dL    HCT 03.4  74.2 - 59.5 %    MCV 84.5  78.0 - 100.0 fL    MCH 27.6  26.0 - 34.0 pg    MCHC 32.7  30.0 - 36.0 g/dL    RDW 63.8 (*) 75.6 - 15.5 %    Platelets 269  150 - 400 K/uL    Neutrophils Relative 61  43 - 77 %    Neutro Abs 4.7  1.7 - 7.7 K/uL    Lymphocytes Relative 27  12 - 46 %    Lymphs Abs 2.1  0.7 - 4.0 K/uL    Monocytes Relative 9  3 - 12 %    Monocytes Absolute 0.7  0.1 - 1.0 K/uL    Eosinophils Relative 3  0 - 5 %    Eosinophils Absolute 0.2  0.0 - 0.7 K/uL    Basophils Relative 1  0 - 1 %    Basophils Absolute 0.0  0.0 - 0.1 K/uL   HEMOGLOBIN A1C     Status: Abnormal   Collection Time   08/17/12  9:38 AM      Component Value Range Comment   Hemoglobin A1C 10.3 (*) <5.7 %    Mean Plasma Glucose 249 (*) <117 mg/dL   GLUCOSE, CAPILLARY     Status: Abnormal   Collection Time   08/17/12 11:16 AM  Component Value Range Comment   Glucose-Capillary 177 (*) 70 - 99 mg/dL    Comment 1 Documented in Chart      Comment 2 Notify RN     URINE RAPID DRUG SCREEN (HOSP PERFORMED)      Status: Abnormal   Collection Time   08/17/12 11:49 AM      Component Value Range Comment   Opiates POSITIVE (*) NONE DETECTED    Cocaine NONE DETECTED  NONE DETECTED    Benzodiazepines NONE DETECTED  NONE DETECTED    Amphetamines NONE DETECTED  NONE DETECTED    Tetrahydrocannabinol NONE DETECTED  NONE DETECTED    Barbiturates NONE DETECTED  NONE DETECTED   PROTIME-INR     Status: Normal   Collection Time   08/17/12 12:24 PM      Component Value Range Comment   Prothrombin Time 13.0  11.6 - 15.2 seconds    INR 0.96  0.00 - 1.49   COMPREHENSIVE METABOLIC PANEL     Status: Abnormal   Collection Time   08/17/12 12:24 PM      Component Value Range Comment   Sodium 143  135 - 145 mEq/L    Potassium 3.2 (*) 3.5 - 5.1 mEq/L    Chloride 105  96 - 112 mEq/L    CO2 31  19 - 32 mEq/L    Glucose, Bld 198 (*) 70 - 99 mg/dL    BUN 8  6 - 23 mg/dL    Creatinine, Ser 4.09  0.50 - 1.10 mg/dL    Calcium 8.9  8.4 - 81.1 mg/dL    Total Protein 6.2  6.0 - 8.3 g/dL    Albumin 2.4 (*) 3.5 - 5.2 g/dL    AST 12  0 - 37 U/L    ALT 10  0 - 35 U/L    Alkaline Phosphatase 117  39 - 117 U/L    Total Bilirubin 0.2 (*) 0.3 - 1.2 mg/dL    GFR calc non Af Amer 88 (*) >90 mL/min    GFR calc Af Amer >90  >90 mL/min   GLUCOSE, CAPILLARY     Status: Abnormal   Collection Time   08/17/12  4:46 PM      Component Value Range Comment   Glucose-Capillary 160 (*) 70 - 99 mg/dL    Comment 1 Documented in Chart      Comment 2 Notify RN     GLUCOSE, CAPILLARY     Status: Abnormal   Collection Time   08/17/12  8:58 PM      Component Value Range Comment   Glucose-Capillary 135 (*) 70 - 99 mg/dL   GLUCOSE, CAPILLARY     Status: Abnormal   Collection Time   08/18/12  6:43 AM      Component Value Range Comment   Glucose-Capillary 142 (*) 70 - 99 mg/dL    Comment 1 Notify RN     CBC     Status: Normal   Collection Time   08/18/12  7:00 AM      Component Value Range Comment   WBC 7.6  4.0 - 10.5 K/uL    RBC 4.60   3.87 - 5.11 MIL/uL    Hemoglobin 12.6  12.0 - 15.0 g/dL    HCT 91.4  78.2 - 95.6 %    MCV 83.3  78.0 - 100.0 fL    MCH 27.4  26.0 - 34.0 pg    MCHC 32.9  30.0 - 36.0 g/dL  RDW 15.4  11.5 - 15.5 %    Platelets 275  150 - 400 K/uL   COMPREHENSIVE METABOLIC PANEL     Status: Abnormal   Collection Time   08/18/12  7:00 AM      Component Value Range Comment   Sodium 140  135 - 145 mEq/L    Potassium 3.3 (*) 3.5 - 5.1 mEq/L    Chloride 105  96 - 112 mEq/L    CO2 27  19 - 32 mEq/L    Glucose, Bld 137 (*) 70 - 99 mg/dL    BUN 5 (*) 6 - 23 mg/dL    Creatinine, Ser 4.09  0.50 - 1.10 mg/dL    Calcium 8.4  8.4 - 81.1 mg/dL    Total Protein 5.8 (*) 6.0 - 8.3 g/dL    Albumin 2.2 (*) 3.5 - 5.2 g/dL    AST 17  0 - 37 U/L    ALT 14  0 - 35 U/L    Alkaline Phosphatase 116  39 - 117 U/L    Total Bilirubin 0.3  0.3 - 1.2 mg/dL    GFR calc non Af Amer 77 (*) >90 mL/min    GFR calc Af Amer 90 (*) >90 mL/min    Ct Head Wo Contrast  08/17/2012  *RADIOLOGY REPORT*  Clinical Data: Falls, weakness.  CT HEAD WITHOUT CONTRAST  Technique:  Contiguous axial images were obtained from the base of the skull through the vertex without contrast.  Comparison: None.  Findings: Periventricular and subcortical white matter hypodensities are most in keeping with chronic microangiopathic change. There is no evidence for acute hemorrhage, hydrocephalus, mass lesion, or abnormal extra-axial fluid collection.  No definite CT evidence for acute infarction.  The visualized paranasal sinuses and mastoid air cells are predominately clear.  No displaced calvarial fracture.  IMPRESSION: White matter changes as above.  No definite acute intracranial abnormality.   Original Report Authenticated By: Waneta Martins, M.D.    Ct Abdomen Pelvis W Contrast  08/17/2012  *RADIOLOGY REPORT*  Clinical Data: Abdominal pain  CT ABDOMEN AND PELVIS WITH CONTRAST  Technique:  Multidetector CT imaging of the abdomen and pelvis was performed  following the standard protocol during bolus administration of intravenous contrast.  Contrast: OMNIPAQUE IOHEXOL 300 MG/ML  SOLN  Comparison: None.  Findings: There is significant streak artifact secondary to patient contact with the gantry.  Minimal dependent scarring versus atelectasis, right greater than left.  Heart size upper normal.  No pleural or pericardial effusion.  Low attenuation of the liver is nonspecific post contrast however suggests fatty infiltration.  Absent gallbladder.  No biliary ductal dilatation.  Unremarkable spleen, pancreas, adrenal glands.  Nonobstructing renal stones versus vascular calcifications.  Too small further characterize hypodensities within the kidneys. No hydronephrosis or hydroureter.  No bowel obstruction.  No CT evidence for colitis.  Normal appendix.  No free intraperitoneal air or fluid.  No lymphadenopathy.  Circumaortic left renal vein. There is scattered atherosclerotic calcification of the aorta and its branches. No aneurysmal dilatation.  Decompressed bladder with air non dependently. Diminutive or absent uterus.  No adnexal mass.  Multilevel degenerative changes of the imaged spine. No acute or aggressive appearing osseous lesion.  IMPRESSION: Air within the bladder is nonspecific.  Correlate with recent history of instrumentation. In the absence of instrumentation, differential would include fistulous communication with bowel.  Hepatic steatosis.  Nonobstructing renal stones versus vascular calcifications.   Original Report Authenticated By: Waneta Martins, M.D.  Dg Chest Portable 1 View  08/16/2012  *RADIOLOGY REPORT*  Clinical Data: Weakness, falls.  PORTABLE CHEST - 1 VIEW  Comparison: None.  Findings: Heart size upper normal to mildly enlarged. Right hemidiaphragm mildly elevated with mild lung base opacity. Otherwise, no focal consolidation, pleural effusion, or pneumothorax.  No acute osseous finding.  IMPRESSION: Heart size upper normal.   Minimal lung base opacity, likely atelectasis.   Original Report Authenticated By: Waneta Martins, M.D.        Assessment/Plan 1. Abdominal pain, unknown etiology 2. Diarrhea 3. UTI 4. DM 5. HTN  Plan: 1. Some of the patient's lower abdominal pain may be secondary to her urinary tract infection; however, I agree with urology that her upper abdominal pain is likely not secondary to this.  I think the etiology is unclear as her CT scan does not show any evidence of intra-abdominal process and she no longer has her gallbladder.  She does c/o diarrhea after eating over the last several weeks.  She is being check for C. Diff colitis, which is pending.  Her appendix is well visualized on CT scan and is normal.  The patient does not have a fever or WBC.  I doubt she has a colovesicle fistula as there is no evidence on CT of a connection and she does not notice any stool looking particles in her urine.. Urology suspects the air is secondary to her UTI.  I do not currently see any surgical indications for this patient.  She may benefit from a GI consult to evaluate her diarrhea and abdominal pain.  Thank you for this consult.  Please call as needed.  Keymoni Mccaster E 08/18/2012, 11:37 AM

## 2012-08-18 NOTE — Plan of Care (Addendum)
Problem: Food- and Nutrition-Related Knowledge Deficit (NB-1.1) Goal: Nutrition education Formal process to instruct or train a patient/client in a skill or to impart knowledge to help patients/clients voluntarily manage or modify food choices and eating behavior to maintain or improve health.  Outcome: Completed/Met Date Met:  08/18/12  RD consulted for nutrition education regarding diabetes.     Lab Results  Component Value Date    HGBA1C 10.3* 08/17/2012    RD provided "Carbohydrate Counting for People with Diabetes" handout from the Academy of Nutrition [employer] Dietetics. Reviewed guidelines and recommendations. Per recall, patient does not eat 3 meals consistently; RD emphasized importance of this. Patient also consumes high amounts of regular Coke; recommended Coke Zero and other diet/sugar-free beverage options.  Expect fair compliance.  Unable to calculate current BMI as no weight available.  Current diet order is Carbohydrate Modified Medium Calorie; reports her appetite is improving; had a salad that her husband brought her in for lunch. Labs and medications reviewed. No further nutrition interventions warranted at this time. If additional nutrition issues arise, please re-consult RD.  Kirkland Hun, RD, LDN Pager #: [phone] After-Hours Pager #: [phone]

## 2012-08-18 NOTE — Evaluation (Signed)
Physical Therapy Evaluation Patient Details Name: Joy Butler MRN: [id number] DOB: 26-Jan-1951 Today's Date: 08/18/2012 Time: 1478-2956 PT Time Calculation (min): 17 min  PT Assessment / Plan / Recommendation Clinical Impression  Pt admitted with abdominal pain and positive UTI. Evaluation limited secondary to pt with BP >180 systolic in standing. Pt with decreased balance during static standing requiring increased assistance. Pt will benefit from skilled PT in the acute care setting in order to maximize functional mobiltiy and safety prior to d/c. Discussed the need for HHPT and possible increase in support of AD for safety at home.m    PT Assessment  Patient needs continued PT services    Follow Up Recommendations  Home health PT;Supervision/Assistance - 24 hour    Barriers to Discharge        Equipment Recommendations  Rolling walker with 5" wheels    Recommendations for Other Services     Frequency Min 3X/week    Precautions / Restrictions Precautions Precautions: Fall   Pertinent Vitals/Pain BP in sitting was 183/72. Upon standing, BP increased to 185/79. RN notified and activity seized.       Mobility  Bed Mobility Bed Mobility: Not assessed Transfers Transfers: Sit to Stand;Stand to Sit Sit to Stand: 4: Min assist;With upper extremity assist;From chair/3-in-1 Stand to Sit: 4: Min assist;With upper extremity assist;To chair/3-in-1 Details for Transfer Assistance: VC for hand placement. Pt required increased assistance through UEs into standing. Min assist for control.  Ambulation/Gait Ambulation/Gait Assistance: Not tested (comment) (see vitals)    Exercises     PT Diagnosis: Difficulty walking;Acute pain  PT Problem List: Decreased activity tolerance;Decreased balance;Decreased mobility;Decreased knowledge of use of DME;Decreased safety awareness;Decreased knowledge of precautions;Pain PT Treatment Interventions: DME instruction;Gait training;Stair  training;Functional mobility training;Therapeutic activities;Balance training;Patient/family education   PT Goals Acute Rehab PT Goals PT Goal Formulation: With patient Time For Goal Achievement: 08/25/12 Potential to Achieve Goals: Fair Pt will go Supine/Side to Sit: with modified independence PT Goal: Supine/Side to Sit - Progress: Goal set today Pt will go Sit to Supine/Side: with modified independence PT Goal: Sit to Supine/Side - Progress: Goal set today Pt will go Sit to Stand: with supervision PT Goal: Sit to Stand - Progress: Goal set today Pt will go Stand to Sit: with supervision PT Goal: Stand to Sit - Progress: Goal set today Pt will Transfer Bed to Chair/Chair to Bed: with supervision PT Transfer Goal: Bed to Chair/Chair to Bed - Progress: Goal set today Pt will Ambulate: 51 - 150 feet;with supervision;with least restrictive assistive device PT Goal: Ambulate - Progress: Goal set today Pt will Go Up / Down Stairs: 1-2 stairs;with rail(s);with supervision PT Goal: Up/Down Stairs - Progress: Goal set today  Visit Information  Last PT Received On: 08/18/12 Assistance Needed: +1 PT/OT Co-Evaluation/Treatment: Yes    Subjective Data  Patient Stated Goal: to go home with husband, get rid of stomach pain   Prior Functioning  Home Living Lives With: Spouse;Family Available Help at Discharge: Family;Available 24 hours/day Type of Home: House Home Access: Stairs to enter [employer] of Steps: 1 Entrance Stairs-Rails: Right Home Layout: One level Bathroom Shower/Tub: Engineer, manufacturing systems: Standard Home Adaptive Equipment: Straight cane Prior Function Level of Independence: Independent with assistive device(s) Able to Take Stairs?: Reciprically Driving: No Vocation: On disability Communication Communication: No difficulties Dominant Hand: Right    Cognition  Overall Cognitive Status: Appears within functional limits for tasks  assessed/performed Arousal/Alertness: Awake/alert Orientation Level: Appears intact for tasks assessed Behavior  During Session: [facility] for tasks performed    Extremity/Trunk Assessment Right Lower Extremity Assessment RLE ROM/Strength/Tone: Within functional levels RLE Sensation: WFL - Light Touch Left Lower Extremity Assessment LLE ROM/Strength/Tone: Within functional levels LLE Sensation: WFL - Light Touch   Balance Balance Balance Assessed: Yes Static Standing Balance Static Standing - Balance Support: Bilateral upper extremity supported Static Standing - Level of Assistance: 3: Mod assist Static Standing - Comment/# of Minutes: Pt standing at chair with BUE support. Pt required mod assist as she was unsteady both posteriorly and laterally to both directions. Required mod assist for stabilit.   End of Session PT - End of Session Equipment Utilized During Treatment: Gait belt Activity Tolerance: Treatment limited secondary to medical complications (Comment) Patient left: in chair;with call bell/phone within reach Nurse Communication: Mobility status;Other (comment) (BP)   Milana Kidney 08/18/2012, 1:39 PM  08/18/2012 Milana Kidney DPT PAGER: [phone] OFFICE: [phone]

## 2012-08-18 NOTE — Consult Note (Signed)
[facility] CONSULT Reason for consult: Abdominal pain and diarrhea Referring Physician: [facility]  Joy Butler is an 61 y.o. female.  HPI: Patient admitted with abdominal pain and diarrhea yesterday. She states that she has had this for about a month. In spite of her history of diarrhea every time she eats, she has not had a stool since admission until tonight after supper and she says it was semi-formed. She has not seen any blood in her stool. She reports that she has had these symptoms of abdominal pain for some time to come and go but have been quite severe for the past 4-6 weeks. She's had chronic urinary tract infections and had a CT scan in the emergency room revealing air in the bladder pyuria and hematuria consistent with UTI. There was no clear sign of fistula and she was seen by general surgery as well as urology who did not feel that she had a fistula. She has not seen any feces in her urine. She states that she has had a normal amount of flatus. She denies travel, perhaps, anyone else in her home with diarrhea. She has recently moved [place] from [place]. She reports that she was hospitalized 3/13 in [place] with pneumonia and had a screening colonoscopy that she states was done for age reasons that was negative. She is diabetic, status post cholecystectomy 25 years ago. The pain that she has is located in her right upper quadrant and is nearly constant. Sometimes it's better and other times worse with eating. She thinks that she may have been told to take some medication for acid but she hasn't been doing. She denies taking any Advil, Aleve or other NSAIDs.  Past Medical History  Diagnosis Date  . Diabetes mellitus   . Hypertension   . Pulmonary embolism   . UTI (urinary tract infection)     Past Surgical History  Procedure Date  . Abdominal hysterectomy   . Cholecystectomy     History reviewed. No pertinent family history.  Social History:   reports that she has been smoking Cigarettes.  She has a 2.5 pack-year smoking history. She does not have any smokeless tobacco history on file. She reports that she does not drink alcohol or use illicit drugs.  Allergies:  Allergies  Allergen Reactions  . Sulfa Antibiotics Rash  . Sulfur Rash    Medications;    . amLODipine  10 mg Oral Daily  . cefTRIAXone (ROCEPHIN)  IV  1 g Intravenous Q24H  . insulin aspart  0-9 Units Subcutaneous TID WC  . living well with diabetes book   Does not apply Once  . losartan  100 mg Oral Daily  .  morphine injection  4 mg Intravenous Once  . potassium chloride  10 mEq Intravenous Q1 Hr x 4  . potassium chloride  40 mEq Oral Once  . sodium chloride  3 mL Intravenous Q12H  . DISCONTD: amLODipine  5 mg Oral Daily   PRN Meds acetaminophen, acetaminophen, hydrALAZINE, morphine injection, ondansetron (ZOFRAN) IV, ondansetron Results for orders placed during the hospital encounter of 08/16/12 (from the past 48 hour(s))  URINALYSIS, ROUTINE W REFLEX MICROSCOPIC     Status: Abnormal   Collection Time   08/16/12  8:09 PM      Component Value Range Comment   Color, Urine YELLOW  YELLOW    APPearance CLOUDY (*) CLEAR    Specific Gravity, Urine 1.027  1.005 - 1.030    pH 6.0  5.0 -  8.0    Glucose, UA 250 (*) NEGATIVE mg/dL    Hgb urine dipstick MODERATE (*) NEGATIVE    Bilirubin Urine SMALL (*) NEGATIVE    Ketones, ur NEGATIVE  NEGATIVE mg/dL    Protein, ur >161 (*) NEGATIVE mg/dL    Urobilinogen, UA 0.2  0.0 - 1.0 mg/dL    Nitrite NEGATIVE  NEGATIVE    Leukocytes, UA NEGATIVE  NEGATIVE   URINE MICROSCOPIC-ADD ON     Status: Abnormal   Collection Time   08/16/12  8:09 PM      Component Value Range Comment   Squamous Epithelial / LPF FEW (*) RARE    WBC, UA 3-6  <3 WBC/hpf    RBC / HPF 7-10  <3 RBC/hpf    Bacteria, UA MANY (*) RARE    Casts HYALINE CASTS (*) NEGATIVE   URINE CULTURE     Status: Normal (Preliminary result)   Collection Time    08/16/12  8:09 PM      Component Value Range Comment   Specimen Description URINE, RANDOM      Special Requests WR:UEAVW ON [id number] @2325       Culture  Setup Time 08/17/2012 08:46      Colony Count >=100,000 COLONIES/ML      Culture ESCHERICHIA COLI      Report Status PENDING     CBC WITH DIFFERENTIAL     Status: Abnormal   Collection Time   08/16/12  8:30 PM      Component Value Range Comment   WBC 9.0  4.0 - 10.5 K/uL    RBC 5.38 (*) 3.87 - 5.11 MIL/uL    Hemoglobin 15.2 (*) 12.0 - 15.0 g/dL    HCT 14.7  82.9 - 56.2 %    MCV 83.5  78.0 - 100.0 fL    MCH 28.3  26.0 - 34.0 pg    MCHC 33.9  30.0 - 36.0 g/dL    RDW 13.0  86.5 - 78.4 %    Platelets 326  150 - 400 K/uL    Neutrophils Relative 59  43 - 77 %    Neutro Abs 5.4  1.7 - 7.7 K/uL    Lymphocytes Relative 30  12 - 46 %    Lymphs Abs 2.7  0.7 - 4.0 K/uL    Monocytes Relative 7  3 - 12 %    Monocytes Absolute 0.7  0.1 - 1.0 K/uL    Eosinophils Relative 3  0 - 5 %    Eosinophils Absolute 0.3  0.0 - 0.7 K/uL    Basophils Relative 1  0 - 1 %    Basophils Absolute 0.1  0.0 - 0.1 K/uL   COMPREHENSIVE METABOLIC PANEL     Status: Abnormal   Collection Time   08/16/12  8:30 PM      Component Value Range Comment   Sodium 141  135 - 145 mEq/L    Potassium 3.1 (*) 3.5 - 5.1 mEq/L    Chloride 101  96 - 112 mEq/L    CO2 31  19 - 32 mEq/L    Glucose, Bld 191 (*) 70 - 99 mg/dL    BUN 9  6 - 23 mg/dL    Creatinine, Ser 6.96  0.50 - 1.10 mg/dL    Calcium 9.9  8.4 - 29.5 mg/dL    Total Protein 7.3  6.0 - 8.3 g/dL    Albumin 2.7 (*) 3.5 - 5.2 g/dL    AST 13  0 -  37 U/L    ALT 13  0 - 35 U/L    Alkaline Phosphatase 128 (*) 39 - 117 U/L    Total Bilirubin 0.3  0.3 - 1.2 mg/dL    GFR calc non Af Amer 89 (*) >90 mL/min    GFR calc Af Amer >90  >90 mL/min   GLUCOSE, CAPILLARY     Status: Abnormal   Collection Time   08/16/12  8:35 PM      Component Value Range Comment   Glucose-Capillary 198 (*) 70 - 99 mg/dL   LIPASE, BLOOD     Status:  Normal   Collection Time   08/17/12 12:57 AM      Component Value Range Comment   Lipase 45  11 - 59 U/L   LACTIC ACID, PLASMA     Status: Normal   Collection Time   08/17/12 12:57 AM      Component Value Range Comment   Lactic Acid, Venous 1.4  0.5 - 2.2 mmol/L   COMPREHENSIVE METABOLIC PANEL     Status: Abnormal   Collection Time   08/17/12  9:38 AM      Component Value Range Comment   Sodium 143  135 - 145 mEq/L    Potassium 3.2 (*) 3.5 - 5.1 mEq/L    Chloride 105  96 - 112 mEq/L    CO2 30  19 - 32 mEq/L    Glucose, Bld 181 (*) 70 - 99 mg/dL    BUN 8  6 - 23 mg/dL    Creatinine, Ser 1.61  0.50 - 1.10 mg/dL    Calcium 9.0  8.4 - 09.6 mg/dL    Total Protein 6.4  6.0 - 8.3 g/dL    Albumin 2.4 (*) 3.5 - 5.2 g/dL    AST 14  0 - 37 U/L    ALT 10  0 - 35 U/L    Alkaline Phosphatase 114  39 - 117 U/L    Total Bilirubin 0.2 (*) 0.3 - 1.2 mg/dL    GFR calc non Af Amer 70 (*) >90 mL/min    GFR calc Af Amer 81 (*) >90 mL/min   CBC WITH DIFFERENTIAL     Status: Abnormal   Collection Time   08/17/12  9:38 AM      Component Value Range Comment   WBC 7.7  4.0 - 10.5 K/uL    RBC 4.85  3.87 - 5.11 MIL/uL    Hemoglobin 13.4  12.0 - 15.0 g/dL    HCT 04.5  40.9 - 81.1 %    MCV 84.5  78.0 - 100.0 fL    MCH 27.6  26.0 - 34.0 pg    MCHC 32.7  30.0 - 36.0 g/dL    RDW 91.4 (*) 78.2 - 15.5 %    Platelets 269  150 - 400 K/uL    Neutrophils Relative 61  43 - 77 %    Neutro Abs 4.7  1.7 - 7.7 K/uL    Lymphocytes Relative 27  12 - 46 %    Lymphs Abs 2.1  0.7 - 4.0 K/uL    Monocytes Relative 9  3 - 12 %    Monocytes Absolute 0.7  0.1 - 1.0 K/uL    Eosinophils Relative 3  0 - 5 %    Eosinophils Absolute 0.2  0.0 - 0.7 K/uL    Basophils Relative 1  0 - 1 %    Basophils Absolute 0.0  0.0 - 0.1 K/uL  HEMOGLOBIN A1C     Status: Abnormal   Collection Time   08/17/12  9:38 AM      Component Value Range Comment   Hemoglobin A1C 10.3 (*) <5.7 %    Mean Plasma Glucose 249 (*) <117 mg/dL   GLUCOSE,  CAPILLARY     Status: Abnormal   Collection Time   08/17/12 11:16 AM      Component Value Range Comment   Glucose-Capillary 177 (*) 70 - 99 mg/dL    Comment 1 Documented in Chart      Comment 2 Notify RN     URINE RAPID DRUG SCREEN (HOSP PERFORMED)     Status: Abnormal   Collection Time   08/17/12 11:49 AM      Component Value Range Comment   Opiates POSITIVE (*) NONE DETECTED    Cocaine NONE DETECTED  NONE DETECTED    Benzodiazepines NONE DETECTED  NONE DETECTED    Amphetamines NONE DETECTED  NONE DETECTED    Tetrahydrocannabinol NONE DETECTED  NONE DETECTED    Barbiturates NONE DETECTED  NONE DETECTED   PROTIME-INR     Status: Normal   Collection Time   08/17/12 12:24 PM      Component Value Range Comment   Prothrombin Time 13.0  11.6 - 15.2 seconds    INR 0.96  0.00 - 1.49   COMPREHENSIVE METABOLIC PANEL     Status: Abnormal   Collection Time   08/17/12 12:24 PM      Component Value Range Comment   Sodium 143  135 - 145 mEq/L    Potassium 3.2 (*) 3.5 - 5.1 mEq/L    Chloride 105  96 - 112 mEq/L    CO2 31  19 - 32 mEq/L    Glucose, Bld 198 (*) 70 - 99 mg/dL    BUN 8  6 - 23 mg/dL    Creatinine, Ser 1.61  0.50 - 1.10 mg/dL    Calcium 8.9  8.4 - 09.6 mg/dL    Total Protein 6.2  6.0 - 8.3 g/dL    Albumin 2.4 (*) 3.5 - 5.2 g/dL    AST 12  0 - 37 U/L    ALT 10  0 - 35 U/L    Alkaline Phosphatase 117  39 - 117 U/L    Total Bilirubin 0.2 (*) 0.3 - 1.2 mg/dL    GFR calc non Af Amer 88 (*) >90 mL/min    GFR calc Af Amer >90  >90 mL/min   GLUCOSE, CAPILLARY     Status: Abnormal   Collection Time   08/17/12  4:46 PM      Component Value Range Comment   Glucose-Capillary 160 (*) 70 - 99 mg/dL    Comment 1 Documented in Chart      Comment 2 Notify RN     GLUCOSE, CAPILLARY     Status: Abnormal   Collection Time   08/17/12  8:58 PM      Component Value Range Comment   Glucose-Capillary 135 (*) 70 - 99 mg/dL   GLUCOSE, CAPILLARY     Status: Abnormal   Collection Time   08/18/12   6:43 AM      Component Value Range Comment   Glucose-Capillary 142 (*) 70 - 99 mg/dL    Comment 1 Notify RN     CBC     Status: Normal   Collection Time   08/18/12  7:00 AM      Component Value Range Comment  WBC 7.6  4.0 - 10.5 K/uL    RBC 4.60  3.87 - 5.11 MIL/uL    Hemoglobin 12.6  12.0 - 15.0 g/dL    HCT 69.6  29.5 - 28.4 %    MCV 83.3  78.0 - 100.0 fL    MCH 27.4  26.0 - 34.0 pg    MCHC 32.9  30.0 - 36.0 g/dL    RDW 13.2  44.0 - 10.2 %    Platelets 275  150 - 400 K/uL   COMPREHENSIVE METABOLIC PANEL     Status: Abnormal   Collection Time   08/18/12  7:00 AM      Component Value Range Comment   Sodium 140  135 - 145 mEq/L    Potassium 3.3 (*) 3.5 - 5.1 mEq/L    Chloride 105  96 - 112 mEq/L    CO2 27  19 - 32 mEq/L    Glucose, Bld 137 (*) 70 - 99 mg/dL    BUN 5 (*) 6 - 23 mg/dL    Creatinine, Ser 7.25  0.50 - 1.10 mg/dL    Calcium 8.4  8.4 - 36.6 mg/dL    Total Protein 5.8 (*) 6.0 - 8.3 g/dL    Albumin 2.2 (*) 3.5 - 5.2 g/dL    AST 17  0 - 37 U/L    ALT 14  0 - 35 U/L    Alkaline Phosphatase 116  39 - 117 U/L    Total Bilirubin 0.3  0.3 - 1.2 mg/dL    GFR calc non Af Amer 77 (*) >90 mL/min    GFR calc Af Amer 90 (*) >90 mL/min   MAGNESIUM     Status: Normal   Collection Time   08/18/12  7:00 AM      Component Value Range Comment   Magnesium 1.6  1.5 - 2.5 mg/dL   GLUCOSE, CAPILLARY     Status: Abnormal   Collection Time   08/18/12 11:41 AM      Component Value Range Comment   Glucose-Capillary 147 (*) 70 - 99 mg/dL    Comment 1 Documented in Chart      Comment 2 Notify RN     CLOSTRIDIUM DIFFICILE BY PCR     Status: Normal   Collection Time   08/18/12  1:40 PM      Component Value Range Comment   C difficile by pcr NEGATIVE  NEGATIVE   GLUCOSE, CAPILLARY     Status: Abnormal   Collection Time   08/18/12  4:26 PM      Component Value Range Comment   Glucose-Capillary 218 (*) 70 - 99 mg/dL    Comment 1 Documented in Chart      Comment 2 Notify RN       Ct  Head Wo Contrast  08/17/2012  *RADIOLOGY REPORT*  Clinical Data: Falls, weakness.  CT HEAD WITHOUT CONTRAST  Technique:  Contiguous axial images were obtained from the base of the skull through the vertex without contrast.  Comparison: None.  Findings: Periventricular and subcortical white matter hypodensities are most in keeping with chronic microangiopathic change. There is no evidence for acute hemorrhage, hydrocephalus, mass lesion, or abnormal extra-axial fluid collection.  No definite CT evidence for acute infarction.  The visualized paranasal sinuses and mastoid air cells are predominately clear.  No displaced calvarial fracture.  IMPRESSION: White matter changes as above.  No definite acute intracranial abnormality.   Original Report Authenticated By: Waneta Martins, M.D.  Ct Abdomen Pelvis W Contrast  08/17/2012  *RADIOLOGY REPORT*  Clinical Data: Abdominal pain  CT ABDOMEN AND PELVIS WITH CONTRAST  Technique:  Multidetector CT imaging of the abdomen and pelvis was performed following the standard protocol during bolus administration of intravenous contrast.  Contrast: OMNIPAQUE IOHEXOL 300 MG/ML  SOLN  Comparison: None.  Findings: There is significant streak artifact secondary to patient contact with the gantry.  Minimal dependent scarring versus atelectasis, right greater than left.  Heart size upper normal.  No pleural or pericardial effusion.  Low attenuation of the liver is nonspecific post contrast however suggests fatty infiltration.  Absent gallbladder.  No biliary ductal dilatation.  Unremarkable spleen, pancreas, adrenal glands.  Nonobstructing renal stones versus vascular calcifications.  Too small further characterize hypodensities within the kidneys. No hydronephrosis or hydroureter.  No bowel obstruction.  No CT evidence for colitis.  Normal appendix.  No free intraperitoneal air or fluid.  No lymphadenopathy.  Circumaortic left renal vein. There is scattered atherosclerotic  calcification of the aorta and its branches. No aneurysmal dilatation.  Decompressed bladder with air non dependently. Diminutive or absent uterus.  No adnexal mass.  Multilevel degenerative changes of the imaged spine. No acute or aggressive appearing osseous lesion.  IMPRESSION: Air within the bladder is nonspecific.  Correlate with recent history of instrumentation. In the absence of instrumentation, differential would include fistulous communication with bowel.  Hepatic steatosis.  Nonobstructing renal stones versus vascular calcifications.   Original Report Authenticated By: Waneta Martins, M.D.    Dg Chest Portable 1 View  08/16/2012  *RADIOLOGY REPORT*  Clinical Data: Weakness, falls.  PORTABLE CHEST - 1 VIEW  Comparison: None.  Findings: Heart size upper normal to mildly enlarged. Right hemidiaphragm mildly elevated with mild lung base opacity. Otherwise, no focal consolidation, pleural effusion, or pneumothorax.  No acute osseous finding.  IMPRESSION: Heart size upper normal.  Minimal lung base opacity, likely atelectasis.   Original Report Authenticated By: Waneta Martins, M.D.                Blood pressure 162/96, pulse 82, temperature 98 F (36.7 C), temperature source Oral, resp. rate 18, height 5\' 9"  (1.753 m), SpO2 94.00%.  Physical exam:   Gen.-obese African American female Eyes-sclerae are nonicteric Lungs-clear heart-regular rate and rhythm without murmurs or gallops Abdomen-obese slightly elevated bowel sounds with mild right upper quadrant tenderness. Right subcostal and midline suprapubic scar is consistent with cholecystectomy and hysterectomy   Assessment: 1. Abdominal pain right upper quadrant/diarrhea. patient's symptoms are quite vague. CT scan is fairly non-remarkable. She's not had any bowel movements since admission until tonight. Her upper pain could be due to some other cause. I think an upper endoscopy would be reasonable. 2. History of recent  normal screening colonoscopy in [place] 3. Status post cholecystectomy and abdominal hysterectomy 4. Urinary tract infection on antibiotics  Plan: 1. Continue antibiotics. We'll go ahead and arrange EGD in the morning for evaluation of her upper abdominal pain. We'll go ahead and give her Protonix empirically.    Oniel Meleski JR,Chudney Scheffler L 08/18/2012, 6:01 PM

## 2012-08-18 NOTE — Progress Notes (Signed)
PCP: Sheila Oats, MD  Brief HPI: 61 year-old female history of diabetes mellitus type 2, hypertension, history of embolism was not taking her medications except for antihypertensives for last 3 months presented to the ER because of abdominal pain and diarrhea. Patient stated that she's been having diffuse abdominal pain crampy in nature and repeated episodes of diarrhea. Denied any nausea vomiting. Denied any recent use of antibiotics or hospitalization. In the ER, CT abdomen pelvis shows air in the urinary bladder and UA is compatible with UTI. Patient otherwise denied any chest pain or shortness of breath. Felt weak and feels dizzy when she walks.  Past medical history:  Past Medical History  Diagnosis Date  . Diabetes mellitus   . Hypertension   . Pulmonary embolism   . UTI (urinary tract infection)     Consultants: Urology  Procedures: None yet  Subjective: Patient continues to have abdominal pain though it's a bit better. Located mainly in the right side over the flank area and right upper quadrant. Had one episode of emesis yesterday and none since then. Denies any diarrhea since admission.   Objective: Vital signs in last 24 hours: Temp:  [97.6 F (36.4 C)-98.1 F (36.7 C)] 98.1 F (36.7 C) (08/29 0450) Pulse Rate:  [73-81] 81  (08/29 0450) Resp:  [18] 18  (08/29 0450) BP: (151-177)/(79-97) 151/80 mmHg (08/29 0450) SpO2:  [92 %-95 %] 95 % (08/29 0450) Weight change:  Last BM Date: 08/15/12  Intake/Output from previous day:   Intake/Output this shift:    General appearance: alert, cooperative, appears stated age, no distress and moderately obese Head: Normocephalic, without obvious abnormality, atraumatic Resp: clear to auscultation bilaterally Cardio: regular rate and rhythm, S1, S2 normal, no murmur, click, rub or gallop GI: soft, tender diffusely but more so over right upper quadrant and right flank. No rebound rigidity or guarding. bowel sounds normal; no  masses,  no organomegaly Extremities: extremities normal, atraumatic, no cyanosis or edema Pulses: 2+ and symmetric Neurologic: Moving all extremities. No focal deficits.  Lab Results:  Basename 08/18/12 0700 08/17/12 0938  WBC 7.6 7.7  HGB 12.6 13.4  HCT 38.3 41.0  PLT 275 269   BMET  Basename 08/18/12 0700 08/17/12 1224  NA 140 143  K 3.3* 3.2*  CL 105 105  CO2 27 31  GLUCOSE 137* 198*  BUN 5* 8  CREATININE 0.80 0.78  CALCIUM 8.4 8.9  ALT 14 10    Studies/Results: Ct Head Wo Contrast  08/17/2012  *RADIOLOGY REPORT*  Clinical Data: Falls, weakness.  CT HEAD WITHOUT CONTRAST  Technique:  Contiguous axial images were obtained from the base of the skull through the vertex without contrast.  Comparison: None.  Findings: Periventricular and subcortical white matter hypodensities are most in keeping with chronic microangiopathic change. There is no evidence for acute hemorrhage, hydrocephalus, mass lesion, or abnormal extra-axial fluid collection.  No definite CT evidence for acute infarction.  The visualized paranasal sinuses and mastoid air cells are predominately clear.  No displaced calvarial fracture.  IMPRESSION: White matter changes as above.  No definite acute intracranial abnormality.   Original Report Authenticated By: Waneta Martins, M.D.    Ct Abdomen Pelvis W Contrast  08/17/2012  *RADIOLOGY REPORT*  Clinical Data: Abdominal pain  CT ABDOMEN AND PELVIS WITH CONTRAST  Technique:  Multidetector CT imaging of the abdomen and pelvis was performed following the standard protocol during bolus administration of intravenous contrast.  Contrast: OMNIPAQUE IOHEXOL 300 MG/ML  SOLN  Comparison: None.  Findings: There is significant streak artifact secondary to patient contact with the gantry.  Minimal dependent scarring versus atelectasis, right greater than left.  Heart size upper normal.  No pleural or pericardial effusion.  Low attenuation of the liver is nonspecific post  contrast however suggests fatty infiltration.  Absent gallbladder.  No biliary ductal dilatation.  Unremarkable spleen, pancreas, adrenal glands.  Nonobstructing renal stones versus vascular calcifications.  Too small further characterize hypodensities within the kidneys. No hydronephrosis or hydroureter.  No bowel obstruction.  No CT evidence for colitis.  Normal appendix.  No free intraperitoneal air or fluid.  No lymphadenopathy.  Circumaortic left renal vein. There is scattered atherosclerotic calcification of the aorta and its branches. No aneurysmal dilatation.  Decompressed bladder with air non dependently. Diminutive or absent uterus.  No adnexal mass.  Multilevel degenerative changes of the imaged spine. No acute or aggressive appearing osseous lesion.  IMPRESSION: Air within the bladder is nonspecific.  Correlate with recent history of instrumentation. In the absence of instrumentation, differential would include fistulous communication with bowel.  Hepatic steatosis.  Nonobstructing renal stones versus vascular calcifications.   Original Report Authenticated By: Waneta Martins, M.D.    Dg Chest Portable 1 View  08/16/2012  *RADIOLOGY REPORT*  Clinical Data: Weakness, falls.  PORTABLE CHEST - 1 VIEW  Comparison: None.  Findings: Heart size upper normal to mildly enlarged. Right hemidiaphragm mildly elevated with mild lung base opacity. Otherwise, no focal consolidation, pleural effusion, or pneumothorax.  No acute osseous finding.  IMPRESSION: Heart size upper normal.  Minimal lung base opacity, likely atelectasis.   Original Report Authenticated By: Waneta Martins, M.D.     Medications:  Scheduled:    . amLODipine  10 mg Oral Daily  . cefTRIAXone (ROCEPHIN)  IV  1 g Intravenous Q24H  . insulin aspart  0-9 Units Subcutaneous TID WC  . losartan  100 mg Oral Daily  .  morphine injection  4 mg Intravenous Once  . potassium chloride  10 mEq Intravenous Q1 Hr x 4  . sodium chloride  3 mL  Intravenous Q12H  . DISCONTD: amLODipine  5 mg Oral Daily   Continuous:    . 0.9 % NaCl with KCl 20 mEq / L 75 mL/hr at 08/18/12 0802   ZOX:[id number], acetaminophen, hydrALAZINE, morphine injection, ondansetron (ZOFRAN) IV, ondansetron  Assessment/Plan:  Principal Problem:  *Abdominal pain Active Problems:  Diarrhea  HTN (hypertension)  Diabetes mellitus  Personal history of PE (pulmonary embolism)    Abdominal pain Etiology remains unclear. Per Urology not related to air in bladder. She is s/p cholecystectomy. There are kidney stones noted but no obstruction. Pain could be from UTi but site is unsual. No evidence for pyelonephritis on CT. Records requested from PCP office regarding last colonoscopy. Due to significant pain will go ahead and consult Gen surg and GI.   Abnormal UA/Questionable UTI with air in the Urinary bladder Appreciate Urology input. Will continue treating with antibiotics. Follow up urine culture.   Diarrhea Appears to have resolved.  Hypokalemia Will replete. Check Mg.  History of Diabetes mellitus type 2 Patient has not taken her insulin for last 3 months. Continue sliding-scale coverage. HBA1c is 10.3.   History of Hypertension Continue losartan. Hold HCTZ as we are gently hydrating. BP continues to be elevated. Add Amlodipine.  History of pulmonary embolism Patient was on Coumadin but has not taken it for last 3 months. Denies any chest pain or shortness of breath  at this time. No clear indication to restart treatment. Await records from PCP.  Tobacco abuse Strongly advised to quit smoking.   Code Status Full Code  DVT Prophylaxis SCD's  PT/OT   LOS: 2 days   [facility]  [facility] Pager [phone] 08/18/2012, 11:43 AM

## 2012-08-18 NOTE — Progress Notes (Signed)
Inpatient Diabetes Program Recommendations  AACE/ADA: New Consensus Statement on Inpatient Glycemic Control (2013)  Target Ranges:  Prepandial:   less than 140 mg/dL      Peak postprandial:   less than 180 mg/dL (1-2 hours)      Critically ill patients:  140 - 180 mg/dL   Reason for Visit: [id number] at 10.3 % Ordered dietician consult, basic diabetes education via videos, ADA booklet, exit care notes Is pt going to be followed by Internal Medicine or FP here?   Note:  Thank you, Lenor Coffin, RN, CNS, Diabetes Coordinator [phone])

## 2012-08-19 ENCOUNTER — Encounter (HOSPITAL_COMMUNITY)
Admission: EM | Disposition: A | Discharge status: Home / Self Care | Payer: Self-pay | Source: Home / Self Care | Attending: Internal Medicine

## 2012-08-19 ENCOUNTER — Encounter (HOSPITAL_COMMUNITY): Payer: Self-pay | Admitting: Gastroenterology

## 2012-08-19 DIAGNOSIS — B9620 Unspecified Escherichia coli [E. coli] as the cause of diseases classified elsewhere: Secondary | ICD-10-CM | POA: Diagnosis present

## 2012-08-19 DIAGNOSIS — N390 Urinary tract infection, site not specified: Secondary | ICD-10-CM

## 2012-08-19 DIAGNOSIS — E119 Type 2 diabetes mellitus without complications: Secondary | ICD-10-CM | POA: Diagnosis not present

## 2012-08-19 DIAGNOSIS — A498 Other bacterial infections of unspecified site: Secondary | ICD-10-CM

## 2012-08-19 DIAGNOSIS — R109 Unspecified abdominal pain: Secondary | ICD-10-CM | POA: Diagnosis not present

## 2012-08-19 HISTORY — PX: ESOPHAGOGASTRODUODENOSCOPY: SHX5428

## 2012-08-19 LAB — GLUCOSE, CAPILLARY
Glucose-Capillary: 267 mg/dL — ABNORMAL HIGH (ref 70–99)
Glucose-Capillary: 221 mg/dL — ABNORMAL HIGH (ref 70–99)

## 2012-08-19 LAB — CBC
HCT: 39 % (ref 36.0–46.0)
Hemoglobin: 12.9 g/dL (ref 12.0–15.0)
RBC: 4.7 MIL/uL (ref 3.87–5.11)
WBC: 8 10*3/uL (ref 4.0–10.5)

## 2012-08-19 LAB — URINE CULTURE

## 2012-08-19 LAB — COMPREHENSIVE METABOLIC PANEL
Albumin: 2.3 g/dL — ABNORMAL LOW (ref 3.5–5.2)
Alkaline Phosphatase: 115 U/L (ref 39–117)
BUN: 7 mg/dL (ref 6–23)
Chloride: 103 mEq/L (ref 96–112)
GFR calc Af Amer: >90 mL/min (ref >90)
Glucose, Bld: 196 mg/dL — ABNORMAL HIGH (ref 70–99)
Potassium: 3.6 mEq/L (ref 3.5–5.1)
Total Bilirubin: 0.2 mg/dL — ABNORMAL LOW (ref 0.3–1.2)

## 2012-08-19 SURGERY — EGD (ESOPHAGOGASTRODUODENOSCOPY)
Anesthesia: Moderate Sedation

## 2012-08-19 MED ORDER — FOSFOMYCIN TROMETHAMINE 3 G PO PACK
3.0000 g | PACK | Freq: Once | ORAL | Status: AC
  Administered 2012-08-19: 3 g via ORAL
  Filled 2012-08-19 (×2): qty 3

## 2012-08-19 MED ORDER — FENTANYL CITRATE 0.05 MG/ML IJ SOLN
INTRAMUSCULAR | Status: DC | PRN
  Administered 2012-08-19 (×2): 25 ug via INTRAVENOUS

## 2012-08-19 MED ORDER — FENTANYL CITRATE 0.05 MG/ML IJ SOLN
INTRAMUSCULAR | Status: AC
  Filled 2012-08-19: qty 4

## 2012-08-19 MED ORDER — METOCLOPRAMIDE HCL 5 MG/ML IJ SOLN
10.0000 mg | Freq: Three times a day (TID) | INTRAMUSCULAR | Status: DC
  Administered 2012-08-19 – 2012-08-20 (×3): 10 mg via INTRAVENOUS
  Filled 2012-08-19 (×6): qty 2

## 2012-08-19 MED ORDER — DIPHENHYDRAMINE HCL 50 MG/ML IJ SOLN: 25.0000 mg | Freq: Once | INTRAMUSCULAR | Status: AC

## 2012-08-19 MED ORDER — INSULIN GLARGINE 100 UNIT/ML ~~LOC~~ SOLN
8.0000 [IU] | Freq: Every day | SUBCUTANEOUS | Status: DC
  Administered 2012-08-19: 8 [IU] via SUBCUTANEOUS

## 2012-08-19 MED ORDER — MIDAZOLAM HCL 5 MG/ML IJ SOLN
INTRAMUSCULAR | Status: AC
  Filled 2012-08-19: qty 2

## 2012-08-19 MED ORDER — MAGNESIUM SULFATE IN D5W 10-5 MG/ML-% IV SOLN
1.0000 g | Freq: Once | INTRAVENOUS | Status: AC
  Administered 2012-08-19: 1 g via INTRAVENOUS
  Filled 2012-08-19: qty 100

## 2012-08-19 MED ORDER — FAMOTIDINE IN NACL 20-0.9 MG/50ML-% IV SOLN
20.0000 mg | Freq: Two times a day (BID) | INTRAVENOUS | Status: DC
  Administered 2012-08-19: 20 mg via INTRAVENOUS
  Filled 2012-08-19 (×3): qty 50

## 2012-08-19 MED ORDER — METHYLPREDNISOLONE SODIUM SUCC 125 MG IJ SOLR
125.0000 mg | Freq: Once | INTRAMUSCULAR | Status: AC
  Administered 2012-08-19: 125 mg via INTRAVENOUS
  Filled 2012-08-19 (×2): qty 2

## 2012-08-19 MED ORDER — LOPERAMIDE HCL 2 MG PO CAPS
2.0000 mg | ORAL_CAPSULE | Freq: Three times a day (TID) | ORAL | Status: DC | PRN
  Administered 2012-08-19: 2 mg via ORAL
  Filled 2012-08-19: qty 1

## 2012-08-19 MED ORDER — DIPHENHYDRAMINE HCL 50 MG/ML IJ SOLN
INTRAMUSCULAR | Status: AC
  Administered 2012-08-19: 25 mg
  Filled 2012-08-19: qty 1

## 2012-08-19 MED ORDER — DIPHENHYDRAMINE HCL 50 MG/ML IJ SOLN: 25.0000 mg | Freq: Three times a day (TID) | INTRAMUSCULAR | Status: DC | PRN

## 2012-08-19 MED ORDER — MIDAZOLAM HCL 10 MG/2ML IJ SOLN
INTRAMUSCULAR | Status: DC | PRN
  Administered 2012-08-19: 1 mg via INTRAVENOUS
  Administered 2012-08-19 (×2): 2 mg via INTRAVENOUS

## 2012-08-19 MED ORDER — METOPROLOL TARTRATE 25 MG PO TABS
25.0000 mg | ORAL_TABLET | Freq: Three times a day (TID) | ORAL | Status: DC
  Administered 2012-08-19 – 2012-08-21 (×6): 25 mg via ORAL
  Filled 2012-08-19 (×8): qty 1

## 2012-08-19 MED ORDER — BUTAMBEN-TETRACAINE-BENZOCAINE 2-2-14 % EX AERO
INHALATION_SPRAY | CUTANEOUS | Status: DC | PRN
  Administered 2012-08-19: 2 via TOPICAL

## 2012-08-19 MED ORDER — CEPHALEXIN 500 MG PO CAPS
500.0000 mg | ORAL_CAPSULE | Freq: Four times a day (QID) | ORAL | Status: DC
  Administered 2012-08-19: 500 mg via ORAL
  Filled 2012-08-19 (×4): qty 1

## 2012-08-19 MED ORDER — DIPHENHYDRAMINE HCL 50 MG/ML IJ SOLN
INTRAMUSCULAR | Status: AC
  Filled 2012-08-19: qty 1

## 2012-08-19 MED ORDER — METHYLPREDNISOLONE SODIUM SUCC 125 MG IJ SOLR
60.0000 mg | Freq: Four times a day (QID) | INTRAMUSCULAR | Status: DC
  Administered 2012-08-19 – 2012-08-20 (×3): 60 mg via INTRAVENOUS
  Filled 2012-08-19 (×3): qty 0.96
  Filled 2012-08-19: qty 2
  Filled 2012-08-19 (×2): qty 0.96

## 2012-08-19 NOTE — Progress Notes (Addendum)
PCP: Sheila Oats, MD  Brief HPI: 61 year-old female history of diabetes mellitus type 2, hypertension, history of embolism was not taking her medications except for antihypertensives for last 3 months presented to the ER because of abdominal pain and diarrhea. Patient stated that she's been having diffuse abdominal pain crampy in nature and repeated episodes of diarrhea. Denied any nausea vomiting. Denied any recent use of antibiotics or hospitalization. In the ER, CT abdomen pelvis shows air in the urinary bladder and UA is compatible with UTI. Patient otherwise denied any chest pain or shortness of breath. Felt weak and feels dizzy when she walks.  Past medical history:  Past Medical History  Diagnosis Date  . Diabetes mellitus   . Hypertension   . Pulmonary embolism   . UTI (urinary tract infection)     Consultants: Urology  Procedures: EGD 8/30  Subjective: Patient feels better. Pain is better. Had loose stool yesterday. No nausea or vomiting.  Objective: Vital signs in last 24 hours: Temp:  [98 F (36.7 C)-98.4 F (36.9 C)] 98 F (36.7 C) (08/30 0818) Pulse Rate:  [73-82] 73  (08/30 0532) Resp:  [14-20] 15  (08/30 1020) BP: (140-185)/(74-116) 140/83 mmHg (08/30 1020) SpO2:  [94 %-99 %] 98 % (08/30 1020) Weight:  [108.5 kg (239 lb 3.2 oz)] 108.5 kg (239 lb 3.2 oz) (08/30 0257) Weight change:  Last BM Date: 08/18/12  Intake/Output from previous day: 08/29 0701 - 08/30 0700 In: -  Out: 1100 [Urine:1100] Intake/Output this shift:    General appearance: alert, cooperative, appears stated age, no distress and moderately obese Head: Normocephalic, without obvious abnormality, atraumatic Resp: clear to auscultation bilaterally Cardio: regular rate and rhythm, S1, S2 normal, no murmur, click, rub or gallop GI: soft, less tender today. No rebound rigidity or guarding. bowel sounds normal; no masses,  no organomegaly Extremities: extremities normal, atraumatic, no  cyanosis or edema Pulses: 2+ and symmetric Neurologic: Moving all extremities. No focal deficits.  Lab Results:  Basename 08/19/12 0500 08/18/12 0700  WBC 8.0 7.6  HGB 12.9 12.6  HCT 39.0 38.3  PLT 289 275   BMET  Basename 08/19/12 0500 08/18/12 0700  NA 139 140  K 3.6 3.3*  CL 103 105  CO2 26 27  GLUCOSE 196* 137*  BUN 7 5*  CREATININE 0.72 0.80  CALCIUM 8.9 8.4  ALT 16 14    Studies/Results: No results found.  Medications:  Scheduled:    . amLODipine  10 mg Oral Daily  . cefTRIAXone (ROCEPHIN)  IV  1 g Intravenous Q24H  . insulin aspart  0-9 Units Subcutaneous TID WC  . living well with diabetes book   Does not apply Once  . losartan  100 mg Oral Daily  . metoCLOPramide (REGLAN) injection  10 mg Intravenous Q8H  . pantoprazole  40 mg Oral BID AC  . potassium chloride  40 mEq Oral Once  . sodium chloride  3 mL Intravenous Q12H   Continuous:    . 0.9 % NaCl with KCl 20 mEq / L 75 mL/hr at 08/19/12 0029  . DISCONTD: sodium chloride     ZOX:[id number], acetaminophen, hydrALAZINE, morphine injection, ondansetron (ZOFRAN) IV, ondansetron, DISCONTD: butamben-tetracaine-benzocaine, DISCONTD: fentaNYL, DISCONTD: midazolam  Assessment/Plan:  Principal Problem:  *Abdominal pain Active Problems:  Diarrhea  HTN (hypertension)  Diabetes mellitus  Personal history of PE (pulmonary embolism)    Abdominal pain Patient's symptoms have improved. Etiology remains unclear. Appreciate GI and Gen surg input. S/P EGD today which was  unremarkable. Had Colonoscopy in 1012 with polypectomy. Started on reglan for gastroparesis. Advance diet slowly. If she tolerates her diet and remains stable she could be discharged tomorrow.   E coli UTI with air in the Urinary bladder Appreciate Urology input. Will continue treating with antibiotics. Change to oral agents.   Diarrhea Negative c-diff. Discontinue contact precautions. Etiology unclear.  Hypokalemia Repleted. Mg low  normal. Give IV  History of Diabetes mellitus type 2 Patient has not taken her insulin for last 3 months. Continue sliding-scale coverage. HBA1c is 10.3. Start Lantus. She will need assistance with medications.  History of Hypertension Poorly controlled. Continue losartan and amlodipine. Add metoprolol.   History of pulmonary embolism Patient was on Coumadin but has not taken it for last 3 months. Denies any chest pain or shortness of breath at this time. No clear indication to restart treatment. Records from PCP reviewed. PE was diagnosed after hospitalization in March 2012. Reason appears to be hospital stay. No history of hypercoagulable state. Has completed more than 12 months of treatment. No need to resume now.  Tobacco abuse Strongly advised to quit smoking.   Code Status Full Code  DVT Prophylaxis SCD's  PT/OT   LOS: 3 days   [facility]  [facility] Pager [phone] 08/19/2012, 11:18 AM

## 2012-08-19 NOTE — Progress Notes (Signed)
OT Cancellation Note  Noted pt just had allergic reaction- will place pt on list to be seen tomorrow by OT.  Thanks, Einar Crow D 08/19/2012, 3:08 PM

## 2012-08-19 NOTE — Interval H&P Note (Signed)
History and Physical Interval Note:  08/19/2012 9:31 AM  Joy Butler  has presented today for surgery, with the diagnosis of RUQ abd pain  The various methods of treatment have been discussed with the patient and family. After consideration of risks, benefits and other options for treatment, the patient has consented to  Procedure(s) (LRB): ESOPHAGOGASTRODUODENOSCOPY (EGD) (N/A) as a surgical intervention .  The patient's history has been reviewed, patient examined, no change in status, stable for surgery.  I have reviewed the patient's chart and labs.  Questions were answered to the patient's satisfaction.     Gillis Boardley JR,Bailyn Spackman L

## 2012-08-19 NOTE — Progress Notes (Addendum)
Gave pt Keflex antibiotic at 1336. At 1400 pt called and said she was itching and having an allergic reaction to the medication. RN went in room and pt scratching all over, face swollen, eyes red, pt very anxious and throwing up. Obtained vital signs, (see doc flow sheets- WNL), placed 02 on [place] 2L, and notified MD. Jovita Gamma order to give 25mg  benedryl IV. Gave to pt, and now pt asleep and appears to be in no distress. Will continue to monitor.

## 2012-08-19 NOTE — Progress Notes (Addendum)
   Called by RN. Patient apparently having an allergic reaction to Keflex. She was given the medication 30 mins prior to onset of symptoms. Patient reports allergy to Keflex but she has been tolerating Ceftriaxone with no difficulties. Patient feels better after she received Benadryl. Still itching some.  Lungs are clear. Lips are swollen. Eyes are swollen. Tongue appears to be normal size. Heart: S1S2 normal regular, no s3 s4. No rubs murmurs  Will give steroid as well. No need for epinephrine. Pepcid. Add keflex to allergy Will need to discuss with ID regarding alternative antibiotic regimen. Monitor closely.  Fortunato Nordin 2:23 PM  Discussed with Dr. Daiva Eves and he recommends one dose of fosfomycin which will last for 3 days and then she would have received a total of 7 days of treatment.  Gayle Collard 3:23 PM

## 2012-08-19 NOTE — Progress Notes (Signed)
Review of records faxed from the [facility] health care system. Patient has history of chronic UTIs, diabetes, DVT requiring Coumadin. Had been on twice a day omeprazole for chronic reflux symptoms. Was noted that she had had previous colonoscopy in 2012 with a removal of a polyp. The procedure note was not included in we had no pathological evaluation of the polyp.

## 2012-08-19 NOTE — Op Note (Signed)
[facility] [address] [place], [address]   ENDOSCOPY PROCEDURE REPORT  PATIENT: Joy, Butler  MR#: [id number] BIRTHDATE: 1951-03-25 , 60  yrs. old GENDER: Female ENDOSCOPIST:Harshitha Fretz, MD REFERRED BY: [facility] PROCEDURE DATE:  08/19/2012 PROCEDURE:   EGD ASA CLASS: Class III INDICATIONS:   epigastric abdominal pain MEDICATION:   fentanyl 50 mcg, Versed 5 mg IV TOPICAL ANESTHETIC:    Cetacaine spray  DESCRIPTION OF PROCEDURE:   the Pentax adult endoscope was inserted blindly with swallowing. The esophagus was entered and was normal. There was no significant hiatal hernia, Barrett's esophagus, or esophagitis. 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 This obscured the majority of the stomach. We were able to advance along the lesser curve and using vigorous irrigation identify the pyloric channel which was widely patent. There was no ulceration or gastric outlet obstruction and the scope easily passed into the duodenum. The duodenal bulb and second duodenum were normal. The scope was withdrawn and the initial findings confirmed. The patient tolerated the procedure well.     COMPLICATIONS: None  ENDOSCOPIC IMPRESSION: 1. Abdominal pain. Probably due to diabetic gastroparesis  RECOMMENDATIONS: treat with Reglan and follow clinically    _______________________________ eSignedCarman Ching, MD 08/19/2012 10:01 AM

## 2012-08-20 DIAGNOSIS — Z888 Allergy status to other drugs, medicaments and biological substances status: Secondary | ICD-10-CM

## 2012-08-20 DIAGNOSIS — T7840XA Allergy, unspecified, initial encounter: Secondary | ICD-10-CM | POA: Diagnosis not present

## 2012-08-20 DIAGNOSIS — R109 Unspecified abdominal pain: Secondary | ICD-10-CM | POA: Diagnosis not present

## 2012-08-20 DIAGNOSIS — E1142 Type 2 diabetes mellitus with diabetic polyneuropathy: Secondary | ICD-10-CM | POA: Diagnosis present

## 2012-08-20 DIAGNOSIS — E1143 Type 2 diabetes mellitus with diabetic autonomic (poly)neuropathy: Secondary | ICD-10-CM | POA: Diagnosis present

## 2012-08-20 DIAGNOSIS — N390 Urinary tract infection, site not specified: Secondary | ICD-10-CM | POA: Diagnosis not present

## 2012-08-20 DIAGNOSIS — K3184 Gastroparesis: Secondary | ICD-10-CM | POA: Diagnosis present

## 2012-08-20 DIAGNOSIS — E119 Type 2 diabetes mellitus without complications: Secondary | ICD-10-CM | POA: Diagnosis not present

## 2012-08-20 DIAGNOSIS — G8929 Other chronic pain: Secondary | ICD-10-CM | POA: Diagnosis present

## 2012-08-20 LAB — BASIC METABOLIC PANEL
Calcium: 9.2 mg/dL (ref 8.4–10.5)
GFR calc Af Amer: 79 mL/min — ABNORMAL LOW (ref >90)
GFR calc non Af Amer: 68 mL/min — ABNORMAL LOW (ref >90)
Potassium: 4.1 mEq/L (ref 3.5–5.1)
Sodium: 136 mEq/L (ref 135–145)

## 2012-08-20 LAB — GLUCOSE, CAPILLARY: Glucose-Capillary: 256 mg/dL — ABNORMAL HIGH (ref 70–99)

## 2012-08-20 MED ORDER — OXYCODONE HCL 5 MG PO TABS
5.0000 mg | ORAL_TABLET | ORAL | Status: DC | PRN
  Administered 2012-08-20 – 2012-08-21 (×4): 5 mg via ORAL
  Filled 2012-08-20 (×4): qty 1

## 2012-08-20 MED ORDER — INSULIN GLARGINE 100 UNIT/ML ~~LOC~~ SOLN
15.0000 [IU] | Freq: Every day | SUBCUTANEOUS | Status: DC
  Administered 2012-08-20: 15 [IU] via SUBCUTANEOUS

## 2012-08-20 MED ORDER — PREDNISONE 20 MG PO TABS
40.0000 mg | ORAL_TABLET | Freq: Two times a day (BID) | ORAL | Status: DC
  Administered 2012-08-20 – 2012-08-21 (×2): 40 mg via ORAL
  Filled 2012-08-20 (×4): qty 2

## 2012-08-20 MED ORDER — FAMOTIDINE 20 MG PO TABS
20.0000 mg | ORAL_TABLET | Freq: Two times a day (BID) | ORAL | Status: DC
  Administered 2012-08-20 – 2012-08-21 (×3): 20 mg via ORAL
  Filled 2012-08-20 (×4): qty 1

## 2012-08-20 MED ORDER — METOCLOPRAMIDE HCL 5 MG PO TABS
5.0000 mg | ORAL_TABLET | Freq: Three times a day (TID) | ORAL | Status: DC
  Administered 2012-08-20 – 2012-08-21 (×4): 5 mg via ORAL
  Filled 2012-08-20 (×9): qty 1

## 2012-08-20 NOTE — Progress Notes (Signed)
PCP: Needs PCP  Brief HPI: 61 year-old female history of diabetes mellitus type 2, hypertension, history of embolism was not taking her medications except for antihypertensives for last 3 months presented to the ER because of abdominal pain and diarrhea. Patient stated that she's been having diffuse abdominal pain crampy in nature and repeated episodes of diarrhea. Denied any nausea vomiting. Denied any recent use of antibiotics or hospitalization. In the ER, CT abdomen pelvis shows air in the urinary bladder and UA is compatible with UTI. Patient otherwise denied any chest pain or shortness of breath. Felt weak and feels dizzy when she walks.  Past medical history:  Past Medical History  Diagnosis Date  . Diabetes mellitus   . Hypertension   . Pulmonary embolism   . UTI (urinary tract infection)     Consultants: Urology, Gen Surg, GI  Procedures: EGD 8/30  Subjective: Patient had an allergic reaction yesterday to keflex. She admits that she forgot to tell us about this allergy. She feels better now. Still with some abdominal pain. Also complaining of her chronic back pain. No nausea but has loose stool after eating meals.  Objective: Vital signs in last 24 hours: Temp:  [97.7 F (36.5 C)-98.1 F (36.7 C)] 97.7 F (36.5 C) (08/31 0452) Pulse Rate:  [67-85] 79  (08/31 0452) Resp:  [14-20] 17  (08/31 0452) BP: (134-189)/(74-116) 134/77 mmHg (08/31 0452) SpO2:  [90 %-100 %] 96 % (08/31 0452) Weight change:  Last BM Date: 08/19/12  Intake/Output from previous day: 08/30 0701 - 08/31 0700 In: -  Out: 400 [Urine:400] Intake/Output this shift:    General appearance: alert, cooperative, appears stated age, no distress and moderately obese Head: Face less swollen compared to yesterday. Lips swelling has resolved. Resp: clear to auscultation bilaterally Cardio: regular rate and rhythm, S1, S2 normal, no murmur, click, rub or gallop GI: soft, less tender today. No rebound  rigidity or guarding. bowel sounds normal; no masses,  no organomegaly Extremities: extremities normal, atraumatic, no cyanosis or edema Pulses: 2+ and symmetric Neurologic: Moving all extremities. No focal deficits.  Lab Results:  Basename 08/19/12 0500 08/18/12 0700  WBC 8.0 7.6  HGB 12.9 12.6  HCT 39.0 38.3  PLT 289 275   BMET  Basename 08/20/12 0634 08/19/12 0500 08/18/12 0700  NA 136 139 --  K 4.1 3.6 --  CL 102 103 --  CO2 20 26 --  GLUCOSE 302* 196* --  BUN 11 7 --  CREATININE 0.89 0.72 --  CALCIUM 9.2 8.9 --  ALT -- 16 14    Studies/Results: No results found.  Medications:  Scheduled:    . amLODipine  10 mg Oral Daily  . diphenhydrAMINE      . diphenhydrAMINE  25 mg Intravenous Once  . famotidine (PEPCID) IV  20 mg Intravenous Q12H  . fosfomycin  3 g Oral Once  . insulin aspart  0-9 Units Subcutaneous TID WC  . insulin glargine  8 Units Subcutaneous QHS  . losartan  100 mg Oral Daily  . magnesium sulfate 1 - 4 g bolus IVPB  1 g Intravenous Once  . methylPREDNISolone (SOLU-MEDROL) injection  125 mg Intravenous Once   Followed by  . methylPREDNISolone (SOLU-MEDROL) injection  60 mg Intravenous Q6H  . metoCLOPramide (REGLAN) injection  10 mg Intravenous Q8H  . metoprolol tartrate  25 mg Oral TID  . pantoprazole  40 mg Oral BID AC  . sodium chloride  3 mL Intravenous Q12H  . DISCONTD:  cefTRIAXone (ROCEPHIN)  IV  1 g Intravenous Q24H  . DISCONTD: cephALEXin  500 mg Oral Q6H   Continuous:    . 0.9 % NaCl with KCl 20 mEq / L 75 mL/hr at 08/20/12 0257  . DISCONTD: sodium chloride     GNF:AOZHYQMVHQION, acetaminophen, diphenhydrAMINE, hydrALAZINE, loperamide, morphine injection, ondansetron (ZOFRAN) IV, ondansetron, DISCONTD: butamben-tetracaine-benzocaine, DISCONTD: fentaNYL, DISCONTD: midazolam  Assessment/Plan:  Principal Problem:  *Abdominal pain Active Problems:  Diarrhea  HTN (hypertension)  Diabetes mellitus  Personal history of PE (pulmonary  embolism)  E. coli UTI   Allergic Reaction, with angioedema, to Keflex Improved with Benadryl, Steroids and Pepcid. Taper steroids.  Abdominal pain Patient's symptoms have improved. Etiology remains unclear though could be related to gastroparesis. Appreciate GI and Gen surg input. S/P EGD 8/30 which was unremarkable except for delayed emptying. Had Colonoscopy in 1012 with polypectomy. Started on reglan for gastroparesis. Diet advanced. Try oral medications for pain.   E coli UTI with air in the Urinary bladder Appreciate Urology input. Reacted to Keflex. Was given Fosfomycin after discussing with Dr. Daiva Eves. Would have received 7 days of treatment.  Diarrhea Negative c-diff. Discontinue contact precautions. Etiology unclear but could be due to complications of diabetes.  Hypokalemia Repleted. Mg low normal. Give IV  History of Diabetes mellitus type 2, poorly controlled Patient has not taken her insulin for last 3 months. Continue sliding-scale coverage. HBA1c is 10.3. Now on Lantus. Will increase dose. She will need assistance with medications.  History of Hypertension BP better today so far. Continue losartan and amlodipine. Added metoprolol 8/30.  History of pulmonary embolism Patient was on Coumadin but has not taken it for last 3 months. Denies any chest pain or shortness of breath at this time. No clear indication to restart treatment. Records from PCP reviewed. PE was diagnosed after hospitalization in March 2012. Reason appears to be hospital stay. No history of hypercoagulable state. Has completed more than 12 months of treatment. No need to resume now.  Tobacco abuse Strongly advised to quit smoking.   Code Status Full Code  DVT Prophylaxis SCD's  Disposition Plan for discharge 9/1.   LOS: 4 days   [facility]  [facility] Pager [phone] 08/20/2012, 9:27 AM

## 2012-08-20 NOTE — Progress Notes (Signed)
Joy Butler 10:05 AM  Subjective: Patient doing better than she was and did not have any problems with her endoscopy and she some breakfast Objective: Vital signs stable no acute distress abdomen is soft nontender endoscopy and CT report reviewed  Assessment: Improved in patient with multiple medical problems and gastroparesis  Plan: Please let me know if I can help any further during this hospital stay otherwise she can followup with my partner Dr. Vilinda Boehringer as an outpatient  [facility]

## 2012-08-21 DIAGNOSIS — E1149 Type 2 diabetes mellitus with other diabetic neurological complication: Secondary | ICD-10-CM

## 2012-08-21 DIAGNOSIS — R109 Unspecified abdominal pain: Secondary | ICD-10-CM | POA: Diagnosis not present

## 2012-08-21 DIAGNOSIS — E1142 Type 2 diabetes mellitus with diabetic polyneuropathy: Secondary | ICD-10-CM

## 2012-08-21 DIAGNOSIS — G8929 Other chronic pain: Secondary | ICD-10-CM

## 2012-08-21 DIAGNOSIS — M549 Dorsalgia, unspecified: Secondary | ICD-10-CM

## 2012-08-21 DIAGNOSIS — K3184 Gastroparesis: Secondary | ICD-10-CM | POA: Diagnosis not present

## 2012-08-21 LAB — GLUCOSE, CAPILLARY: Glucose-Capillary: 279 mg/dL — ABNORMAL HIGH (ref 70–99)

## 2012-08-21 MED ORDER — METOCLOPRAMIDE HCL 5 MG PO TABS: 5.0000 mg | ORAL_TABLET | Freq: Three times a day (TID) | ORAL | Status: DC

## 2012-08-21 MED ORDER — LOSARTAN POTASSIUM-HCTZ 100-25 MG PO TABS: 1.0000 | ORAL_TABLET | Freq: Every day | ORAL | Status: DC

## 2012-08-21 MED ORDER — INSULIN GLARGINE 100 UNIT/ML ~~LOC~~ SOLN: 15.0000 [IU] | Freq: Every day | SUBCUTANEOUS | Status: DC

## 2012-08-21 MED ORDER — METOPROLOL TARTRATE 50 MG PO TABS: 50.0000 mg | ORAL_TABLET | Freq: Two times a day (BID) | ORAL | Status: DC

## 2012-08-21 MED ORDER — OMEPRAZOLE 20 MG PO CPDR: 20.0000 mg | DELAYED_RELEASE_CAPSULE | Freq: Every day | ORAL | Status: DC

## 2012-08-21 MED ORDER — OXYCODONE HCL 5 MG PO TABS: 5.0000 mg | ORAL_TABLET | Freq: Four times a day (QID) | ORAL | Status: AC | PRN

## 2012-08-21 MED ORDER — PREDNISONE 20 MG PO TABS: ORAL_TABLET | ORAL | Status: DC

## 2012-08-21 MED ORDER — AMLODIPINE BESYLATE 10 MG PO TABS: 10.0000 mg | ORAL_TABLET | Freq: Every day | ORAL | Status: DC

## 2012-08-21 MED ORDER — LOPERAMIDE HCL 2 MG PO CAPS: 2.0000 mg | ORAL_CAPSULE | Freq: Three times a day (TID) | ORAL | Status: DC | PRN

## 2012-08-21 MED ORDER — PREDNISONE 20 MG PO TABS
40.0000 mg | ORAL_TABLET | Freq: Every day | ORAL | Status: DC
  Filled 2012-08-21: qty 2

## 2012-08-21 MED ORDER — LOPERAMIDE HCL 2 MG PO CAPS: 2.0000 mg | ORAL_CAPSULE | Freq: Three times a day (TID) | ORAL | Status: AC | PRN

## 2012-08-21 NOTE — Progress Notes (Signed)
Pt/family given discharge instructions, medication lists, follow up appointments, and when to call the doctor.  Pt/family verbalizes understanding. Pt/family given information to get DME. Called MD to confirm discharge DM management.  Pt aware that she will be following up with her PCP. Thomas Hoff

## 2012-08-21 NOTE — Progress Notes (Signed)
Pt lost IV access. IV nurse tried three times to restart IV. Unsuccessful. Day shift nurse paged MD. No call returned. Pt getting PO pain med now. Will continue to monitor.   Alfonso Ellis, RN

## 2012-08-21 NOTE — Discharge Summary (Signed)
Physician Discharge Summary  Patient ID: Joy Butler MRN: [id number] DOB/AGE: May 29, 1951 61 y.o.  Admit date: 08/16/2012 Discharge date: 08/21/2012  PCP: Does not have a PCP  DISCHARGE DIAGNOSES:  Principal Problem:  *Abdominal pain Active Problems:  Diarrhea  HTN (hypertension)  Diabetes mellitus  Personal history of PE (pulmonary embolism)  E. coli UTI  Allergic reaction caused by a drug  Diabetic gastroparesis  Diabetic peripheral neuropathy  Chronic back pain    DISCHARGE CONDITION: fair  INITIAL HISTORY: 61 year-old female history of diabetes mellitus type 2, hypertension, history of embolism was not taking her medications except for antihypertensives for last 3 months presented to the ER because of abdominal pain and diarrhea. Patient stated that she's been having diffuse abdominal pain crampy in nature and repeated episodes of diarrhea. Denied any nausea vomiting. Denied any recent use of antibiotics or hospitalization. In the ER, CT abdomen pelvis shows air in the urinary bladder and UA is compatible with UTI. Patient otherwise denied any chest pain or shortness of breath. Felt weak and feels dizzy when she walks.  HOSPITAL COURSE:   Consultants: Urology (Dr. Laverle Patter), Gen Surg (Dr. Corliss Skains), [facility] GI: Dr. Randa Evens)  Procedures: EGD 8/30   Allergic Reaction, with angioedema, to Keflex  Patient had not reported an allergy to keflex at initial assessment. She was transitioned from Ceftriaxone (which she tolerated well) to Keflex for UTI. She had an allergic reaction with face and lip swelling. She improved with Benadryl, Steroids and Pepcid. She remains stable. Tolerating orally. Steroids will be tapered quickly.  Abdominal pain  Patient's symptoms have improved. Etiology remains unclear though could be related to gastroparesis. Ct did not show anything acute except for air in urinary bladder. Patient was seen by GI (Dr. Randa Evens with [facility]) and by General Surgery  (Dr. Corliss Skains). There was no surgical issue identified. Patient underwent EGD which did not show any concerning findings except for retained food. She reports colonoscopy in 2012 with polypectomy. She was started on reglan for gastroparesis. Diet was advanced which she tolerated. She may follow up with Dr. Randa Evens for further needs.  E coli UTI with air in the Urinary bladder  She was initially started on Ceftriaxone and was seen by Urology for air in bladder noted on CT. They recommended treating with antibiotics for 7 days and then follow up in their office. Since she reacted to Keflex, I discussed with Inf Disease specialist regarding further options. Was given 3g Fosfomycin after discussing with Dr. Daiva Eves. This would provide a total of 7 days of treatment.   Diarrhea  She reports chronic history of diarrhea. C-diff was negative. Etiology unclear but could be due to complications of diabetes.   History of Diabetes mellitus type 2, poorly controlled  Patient has not taken her insulin for last 3 months. Continue sliding-scale coverage. HBA1c is 10.3. Now back on Lantus. Dose was increased yesterday. Since she is on steroids she will have hyperglycemia for a few days. She will need assistance with medications.   History of Hypertension  BP could be even better. Steroids probably making it worse. Should improve as steroid is tapered off. Continue losartan and amlodipine. Added metoprolol 8/30.   History of pulmonary embolism  Patient was on Coumadin but has not taken it for last 3 months. Denies any chest pain or shortness of breath at this time. No clear indication to restart treatment. Records from PCP reviewed. PE was diagnosed after hospitalization in March 2012. Reason appears  to be hospital stay. No history of hypercoagulable state. Has completed more than 12 months of treatment. No need to resume now.   Tobacco abuse  Strongly advised to quit smoking.   Chronic Back Pain Patient was seen by  PT and OT. Rolling walker was recommended. She was asked to follow up with a PCP. She reported injections to back in the past. No neurological deficits noted.  PERTINENT LABS:  Urine culture grew E coli.   IMAGING STUDIES Ct Head Wo Contrast  08/17/2012  *RADIOLOGY REPORT*  Clinical Data: Falls, weakness.  CT HEAD WITHOUT CONTRAST  Technique:  Contiguous axial images were obtained from the base of the skull through the vertex without contrast.  Comparison: None.  Findings: Periventricular and subcortical white matter hypodensities are most in keeping with chronic microangiopathic change. There is no evidence for acute hemorrhage, hydrocephalus, mass lesion, or abnormal extra-axial fluid collection.  No definite CT evidence for acute infarction.  The visualized paranasal sinuses and mastoid air cells are predominately clear.  No displaced calvarial fracture.  IMPRESSION: White matter changes as above.  No definite acute intracranial abnormality.   Original Report Authenticated By: Waneta Martins, M.D.    Ct Abdomen Pelvis W Contrast  08/17/2012  *RADIOLOGY REPORT*  Clinical Data: Abdominal pain  CT ABDOMEN AND PELVIS WITH CONTRAST  Technique:  Multidetector CT imaging of the abdomen and pelvis was performed following the standard protocol during bolus administration of intravenous contrast.  Contrast: OMNIPAQUE IOHEXOL 300 MG/ML  SOLN  Comparison: None.  Findings: There is significant streak artifact secondary to patient contact with the gantry.  Minimal dependent scarring versus atelectasis, right greater than left.  Heart size upper normal.  No pleural or pericardial effusion.  Low attenuation of the liver is nonspecific post contrast however suggests fatty infiltration.  Absent gallbladder.  No biliary ductal dilatation.  Unremarkable spleen, pancreas, adrenal glands.  Nonobstructing renal stones versus vascular calcifications.  Too small further characterize hypodensities within the kidneys.  No hydronephrosis or hydroureter.  No bowel obstruction.  No CT evidence for colitis.  Normal appendix.  No free intraperitoneal air or fluid.  No lymphadenopathy.  Circumaortic left renal vein. There is scattered atherosclerotic calcification of the aorta and its branches. No aneurysmal dilatation.  Decompressed bladder with air non dependently. Diminutive or absent uterus.  No adnexal mass.  Multilevel degenerative changes of the imaged spine. No acute or aggressive appearing osseous lesion.  IMPRESSION: Air within the bladder is nonspecific.  Correlate with recent history of instrumentation. In the absence of instrumentation, differential would include fistulous communication with bowel.  Hepatic steatosis.  Nonobstructing renal stones versus vascular calcifications.   Original Report Authenticated By: Waneta Martins, M.D.    Dg Chest Portable 1 View  08/16/2012  *RADIOLOGY REPORT*  Clinical Data: Weakness, falls.  PORTABLE CHEST - 1 VIEW  Comparison: None.  Findings: Heart size upper normal to mildly enlarged. Right hemidiaphragm mildly elevated with mild lung base opacity. Otherwise, no focal consolidation, pleural effusion, or pneumothorax.  No acute osseous finding.  IMPRESSION: Heart size upper normal.  Minimal lung base opacity, likely atelectasis.   Original Report Authenticated By: Waneta Martins, M.D.     DISCHARGE EXAMINATION: Blood pressure 168/80, pulse 77, temperature 98.4 F (36.9 C), temperature source Oral, resp. rate 18, height 5\' 9"  (1.753 m), weight 108.5 kg (239 lb 3.2 oz), SpO2 94.00%. General appearance: alert, cooperative, appears stated age and no distress Resp: clear to auscultation bilaterally Cardio: regular  rate and rhythm, S1, S2 normal, no murmur, click, rub or gallop GI: soft, non-tender; bowel sounds normal; no masses,  no organomegaly Neurologic: Grossly normal  DISPOSITION: Home  Discharge Orders    Future Orders Please Complete By Expires   Diet Carb  Modified      Increase activity slowly      Discharge instructions      Comments:   Be sure to establish with a PCP     Current Discharge Medication List    START taking these medications   Details  amLODipine (NORVASC) 10 MG tablet Take 1 tablet (10 mg total) by mouth daily. Qty: 30 tablet, Refills: 2    insulin glargine (LANTUS) 100 UNIT/ML injection Inject 15 Units into the skin at bedtime. Qty: 10 mL, Refills: 2    loperamide (IMODIUM) 2 MG capsule Take 1 capsule (2 mg total) by mouth 3 (three) times daily as needed for diarrhea or loose stools. Qty: 30 capsule, Refills: 2    metoCLOPramide (REGLAN) 5 MG tablet Take 1 tablet (5 mg total) by mouth 4 (four) times daily -  before meals and at bedtime. Qty: 120 tablet, Refills: 2    metoprolol (LOPRESSOR) 50 MG tablet Take 1 tablet (50 mg total) by mouth 2 (two) times daily. Qty: 60 tablet, Refills: 2    omeprazole (PRILOSEC) 20 MG capsule Take 1 capsule (20 mg total) by mouth daily. Qty: 30 capsule, Refills: 0    oxyCODONE (OXY IR/ROXICODONE) 5 MG immediate release tablet Take 1 tablet (5 mg total) by mouth every 6 (six) hours as needed for pain. Qty: 30 tablet, Refills: 0    predniSONE (DELTASONE) 20 MG tablet Take 2 tablets daily for 2 days, then 1 tab daily for 2 days then stop. Qty: 6 tablet, Refills: 0      CONTINUE these medications which have CHANGED   Details  losartan-hydrochlorothiazide (HYZAAR) 100-25 MG per tablet Take 1 tablet by mouth daily. Qty: 10 tablet, Refills: 0      STOP taking these medications     insulin aspart (NOVOLOG) 100 UNIT/ML injection        Follow-up Information    Call EDWARDS JR,JAMES L, MD. (as needed for for issues with abdominal pain and nausea)    Contact information:   [address] [employer], [place]. [place] [address] [phone]          TOTAL DISCHARGE TIME: 40 mins  [facility]  [facility] Pager  [phone]  08/21/2012, 9:05 AM

## 2012-08-21 NOTE — Progress Notes (Addendum)
   CARE MANAGEMENT NOTE 08/21/2012  Patient:  Joy Butler, Joy Butler   Account Number:  [id number]  Date Initiated:  08/17/2012  Documentation initiated by:  SIMMONS,CRYSTAL  Subjective/Objective Assessment:   ADMITTED WITH ABD PAIN; LIVES AT HOME ALONE; WAS IPTA.     Action/Plan:   DISCHARGE PLANNING INITIATED.   Anticipated DC Date:  08/18/2012   Anticipated DC Plan:  HOME/SELF CARE      DC Planning Services  CM consult      Choice offered to / List presented to:             Status of service:  Completed, signed off Medicare Important Message given?   (If response is "NO", the following Medicare IM given date fields will be blank) Date Medicare IM given:   Date Additional Medicare IM given:    Discharge Disposition:  HOME/SELF CARE  Per UR Regulation:  Reviewed for med. necessity/level of care/duration of stay  If discussed at Long Length of Stay Meetings, dates discussed:    Comments:  08/21/2012 1100 Unit RN will provided pt with copy of shower stool to pick up at home store. Faxed copy to [facility]. States she has Acupuncturist. NCM encouraged her to provided that info to financial office. States she will call info to [facility] office tomorrow. Pt states she will pick up cane at [facility] or [employer]. No other DME needed. Placed info on d/c instruction for PCP. Instructed pt to call [facility] or toll free number on her insurance card. Isidoro Donning RN CCM Case Mgmt phone [phone]  08/17/12  1109  CRYSTAL SIMMONS RN, BSN [phone] NCM WILL FOLLOW.

## 2012-08-22 LAB — STOOL CULTURE

## 2012-08-23 ENCOUNTER — Encounter (HOSPITAL_COMMUNITY): Payer: Self-pay | Admitting: Gastroenterology

## 2012-08-31 ENCOUNTER — Encounter (HOSPITAL_COMMUNITY): Payer: Self-pay | Admitting: Physical Medicine and Rehabilitation

## 2012-08-31 ENCOUNTER — Inpatient Hospital Stay (HOSPITAL_COMMUNITY)
Admission: EM | Admit: 2012-08-31 | Discharge: 2012-09-09 | DRG: 816 | Disposition: A | Discharge status: Home / Self Care | Payer: MEDICARE | Attending: Internal Medicine | Admitting: Internal Medicine

## 2012-08-31 ENCOUNTER — Emergency Department (HOSPITAL_COMMUNITY): Payer: MEDICARE

## 2012-08-31 DIAGNOSIS — E119 Type 2 diabetes mellitus without complications: Secondary | ICD-10-CM

## 2012-08-31 DIAGNOSIS — Z86718 Personal history of other venous thrombosis and embolism: Secondary | ICD-10-CM

## 2012-08-31 DIAGNOSIS — T7840XA Allergy, unspecified, initial encounter: Secondary | ICD-10-CM

## 2012-08-31 DIAGNOSIS — D735 Infarction of spleen: Secondary | ICD-10-CM

## 2012-08-31 DIAGNOSIS — M549 Dorsalgia, unspecified: Secondary | ICD-10-CM | POA: Diagnosis not present

## 2012-08-31 DIAGNOSIS — E1142 Type 2 diabetes mellitus with diabetic polyneuropathy: Secondary | ICD-10-CM

## 2012-08-31 DIAGNOSIS — D7389 Other diseases of spleen: Secondary | ICD-10-CM | POA: Diagnosis not present

## 2012-08-31 DIAGNOSIS — R109 Unspecified abdominal pain: Secondary | ICD-10-CM

## 2012-08-31 DIAGNOSIS — E1143 Type 2 diabetes mellitus with diabetic autonomic (poly)neuropathy: Secondary | ICD-10-CM

## 2012-08-31 DIAGNOSIS — M1990 Unspecified osteoarthritis, unspecified site: Secondary | ICD-10-CM | POA: Diagnosis present

## 2012-08-31 DIAGNOSIS — G8929 Other chronic pain: Secondary | ICD-10-CM | POA: Diagnosis not present

## 2012-08-31 DIAGNOSIS — E669 Obesity, unspecified: Secondary | ICD-10-CM | POA: Diagnosis present

## 2012-08-31 DIAGNOSIS — N390 Urinary tract infection, site not specified: Secondary | ICD-10-CM

## 2012-08-31 DIAGNOSIS — D72829 Elevated white blood cell count, unspecified: Secondary | ICD-10-CM | POA: Diagnosis not present

## 2012-08-31 DIAGNOSIS — F17200 Nicotine dependence, unspecified, uncomplicated: Secondary | ICD-10-CM | POA: Diagnosis present

## 2012-08-31 DIAGNOSIS — B9620 Unspecified Escherichia coli [E. coli] as the cause of diseases classified elsewhere: Secondary | ICD-10-CM

## 2012-08-31 DIAGNOSIS — M47817 Spondylosis without myelopathy or radiculopathy, lumbosacral region: Secondary | ICD-10-CM | POA: Diagnosis present

## 2012-08-31 DIAGNOSIS — R1031 Right lower quadrant pain: Secondary | ICD-10-CM | POA: Diagnosis not present

## 2012-08-31 DIAGNOSIS — I10 Essential (primary) hypertension: Secondary | ICD-10-CM

## 2012-08-31 DIAGNOSIS — Z86711 Personal history of pulmonary embolism: Secondary | ICD-10-CM

## 2012-08-31 DIAGNOSIS — M51379 Other intervertebral disc degeneration, lumbosacral region without mention of lumbar back pain or lower extremity pain: Secondary | ICD-10-CM | POA: Diagnosis present

## 2012-08-31 DIAGNOSIS — E876 Hypokalemia: Secondary | ICD-10-CM | POA: Diagnosis present

## 2012-08-31 DIAGNOSIS — R197 Diarrhea, unspecified: Secondary | ICD-10-CM

## 2012-08-31 DIAGNOSIS — M5137 Other intervertebral disc degeneration, lumbosacral region: Secondary | ICD-10-CM | POA: Diagnosis present

## 2012-08-31 LAB — COMPREHENSIVE METABOLIC PANEL
Albumin: 2.4 g/dL — ABNORMAL LOW (ref 3.5–5.2)
BUN: 5 mg/dL — ABNORMAL LOW (ref 6–23)
Calcium: 9.5 mg/dL (ref 8.4–10.5)
Creatinine, Ser: 0.74 mg/dL (ref 0.50–1.10)
GFR calc Af Amer: >90 mL/min (ref >90)
Glucose, Bld: 177 mg/dL — ABNORMAL HIGH (ref 70–99)
Potassium: 3.3 mEq/L — ABNORMAL LOW (ref 3.5–5.1)
Total Protein: 7.1 g/dL (ref 6.0–8.3)

## 2012-08-31 LAB — URINE MICROSCOPIC-ADD ON

## 2012-08-31 LAB — URINALYSIS, ROUTINE W REFLEX MICROSCOPIC
Glucose, UA: 250 mg/dL — AB
Ketones, ur: NEGATIVE mg/dL
Leukocytes, UA: NEGATIVE
Nitrite: NEGATIVE
Specific Gravity, Urine: 1.016 (ref 1.005–1.030)
pH: 7 (ref 5.0–8.0)

## 2012-08-31 LAB — CBC WITH DIFFERENTIAL/PLATELET
Basophils Relative: 0 % (ref 0–1)
Eosinophils Absolute: 0.3 10*3/uL (ref 0.0–0.7)
Hemoglobin: 14.4 g/dL (ref 12.0–15.0)
Lymphs Abs: 2.6 10*3/uL (ref 0.7–4.0)
MCH: 28 pg (ref 26.0–34.0)
MCHC: 33.6 g/dL (ref 30.0–36.0)
Monocytes Absolute: 0.9 10*3/uL (ref 0.1–1.0)
Monocytes Relative: 7 % (ref 3–12)
Neutro Abs: 8.6 10*3/uL — ABNORMAL HIGH (ref 1.7–7.7)
Neutrophils Relative %: 70 % (ref 43–77)
Platelets: 360 10*3/uL (ref 150–400)

## 2012-08-31 LAB — PROTIME-INR: INR: 1.04 (ref 0.00–1.49)

## 2012-08-31 MED ORDER — HYDROCHLOROTHIAZIDE 25 MG PO TABS
25.0000 mg | ORAL_TABLET | Freq: Every day | ORAL | Status: DC
  Administered 2012-09-01 – 2012-09-09 (×9): 25 mg via ORAL
  Filled 2012-08-31 (×9): qty 1

## 2012-08-31 MED ORDER — SODIUM CHLORIDE 0.9 % IV SOLN
INTRAVENOUS | Status: DC
  Administered 2012-08-31: 20 mL/h via INTRAVENOUS
  Administered 2012-09-02: 10 mL/h via INTRAVENOUS
  Administered 2012-09-03: via INTRAVENOUS
  Administered 2012-09-08: 10 mL/h via INTRAVENOUS

## 2012-08-31 MED ORDER — METOPROLOL TARTRATE 50 MG PO TABS
50.0000 mg | ORAL_TABLET | Freq: Two times a day (BID) | ORAL | Status: DC
  Administered 2012-08-31 – 2012-09-09 (×18): 50 mg via ORAL
  Filled 2012-08-31 (×19): qty 1

## 2012-08-31 MED ORDER — AMLODIPINE BESYLATE 10 MG PO TABS
10.0000 mg | ORAL_TABLET | Freq: Every day | ORAL | Status: DC
  Administered 2012-09-01 – 2012-09-09 (×9): 10 mg via ORAL
  Filled 2012-08-31 (×9): qty 1

## 2012-08-31 MED ORDER — OXYCODONE HCL 5 MG PO TABS
5.0000 mg | ORAL_TABLET | Freq: Four times a day (QID) | ORAL | Status: DC | PRN
  Administered 2012-08-31 – 2012-09-02 (×7): 5 mg via ORAL
  Filled 2012-08-31 (×7): qty 1

## 2012-08-31 MED ORDER — HYDROMORPHONE HCL PF 1 MG/ML IJ SOLN
1.0000 mg | Freq: Once | INTRAMUSCULAR | Status: AC
  Administered 2012-08-31: 1 mg via INTRAVENOUS
  Filled 2012-08-31: qty 1

## 2012-08-31 MED ORDER — IOHEXOL 300 MG/ML  SOLN
20.0000 mL | INTRAMUSCULAR | Status: DC
  Administered 2012-08-31: 20 mL via ORAL

## 2012-08-31 MED ORDER — LOSARTAN POTASSIUM 50 MG PO TABS
100.0000 mg | ORAL_TABLET | Freq: Every day | ORAL | Status: DC
  Administered 2012-09-01 – 2012-09-09 (×9): 100 mg via ORAL
  Filled 2012-08-31 (×9): qty 2

## 2012-08-31 MED ORDER — METOCLOPRAMIDE HCL 5 MG PO TABS
5.0000 mg | ORAL_TABLET | Freq: Three times a day (TID) | ORAL | Status: DC
  Administered 2012-08-31 – 2012-09-09 (×33): 5 mg via ORAL
  Filled 2012-08-31 (×40): qty 1

## 2012-08-31 MED ORDER — PANTOPRAZOLE SODIUM 40 MG PO TBEC
40.0000 mg | DELAYED_RELEASE_TABLET | Freq: Every day | ORAL | Status: DC
  Administered 2012-09-01 – 2012-09-09 (×9): 40 mg via ORAL
  Filled 2012-08-31 (×9): qty 1

## 2012-08-31 MED ORDER — SODIUM CHLORIDE 0.9 % IV SOLN: INTRAVENOUS | Status: AC

## 2012-08-31 MED ORDER — SODIUM CHLORIDE 0.9 % IJ SOLN
3.0000 mL | Freq: Two times a day (BID) | INTRAMUSCULAR | Status: DC
  Administered 2012-08-31 – 2012-09-05 (×10): 3 mL via INTRAVENOUS

## 2012-08-31 MED ORDER — ENOXAPARIN SODIUM 120 MG/0.8ML ~~LOC~~ SOLN
110.0000 mg | Freq: Two times a day (BID) | SUBCUTANEOUS | Status: DC
  Administered 2012-08-31 – 2012-09-08 (×16): 110 mg via SUBCUTANEOUS
  Filled 2012-08-31 (×17): qty 0.8

## 2012-08-31 MED ORDER — INSULIN GLARGINE 100 UNIT/ML ~~LOC~~ SOLN
15.0000 [IU] | Freq: Every day | SUBCUTANEOUS | Status: DC
  Administered 2012-08-31 – 2012-09-08 (×8): 15 [IU] via SUBCUTANEOUS

## 2012-08-31 MED ORDER — IOHEXOL 300 MG/ML  SOLN
80.0000 mL | Freq: Once | INTRAMUSCULAR | Status: AC | PRN
  Administered 2012-08-31: 80 mL via INTRAVENOUS

## 2012-08-31 MED ORDER — LOSARTAN POTASSIUM-HCTZ 100-25 MG PO TABS: 1.0000 | ORAL_TABLET | Freq: Every day | ORAL | Status: DC

## 2012-08-31 MED ORDER — HYDROMORPHONE HCL PF 1 MG/ML IJ SOLN
0.5000 mg | Freq: Once | INTRAMUSCULAR | Status: AC
  Administered 2012-08-31: 0.5 mg via INTRAVENOUS
  Filled 2012-08-31: qty 1

## 2012-08-31 MED ORDER — ONDANSETRON HCL 4 MG/2ML IJ SOLN
4.0000 mg | Freq: Once | INTRAMUSCULAR | Status: AC
  Administered 2012-08-31: 4 mg via INTRAVENOUS
  Filled 2012-08-31: qty 2

## 2012-08-31 MED ORDER — INSULIN ASPART 100 UNIT/ML ~~LOC~~ SOLN
0.0000 [IU] | Freq: Three times a day (TID) | SUBCUTANEOUS | Status: DC
  Administered 2012-09-01 – 2012-09-02 (×5): 3 [IU] via SUBCUTANEOUS
  Administered 2012-09-02: 2 [IU] via SUBCUTANEOUS
  Administered 2012-09-03: 13:00:00 via SUBCUTANEOUS
  Administered 2012-09-03: 2 [IU] via SUBCUTANEOUS
  Administered 2012-09-03: 3 [IU] via SUBCUTANEOUS
  Administered 2012-09-04: 2 [IU] via SUBCUTANEOUS
  Administered 2012-09-04 (×2): 3 [IU] via SUBCUTANEOUS
  Administered 2012-09-05: 2 [IU] via SUBCUTANEOUS
  Administered 2012-09-05: 3 [IU] via SUBCUTANEOUS
  Administered 2012-09-06: 2 [IU] via SUBCUTANEOUS
  Administered 2012-09-06: 5 [IU] via SUBCUTANEOUS
  Administered 2012-09-06: 2 [IU] via SUBCUTANEOUS
  Administered 2012-09-07: 5 [IU] via SUBCUTANEOUS
  Administered 2012-09-07 – 2012-09-08 (×3): 3 [IU] via SUBCUTANEOUS
  Administered 2012-09-08: 5 [IU] via SUBCUTANEOUS
  Administered 2012-09-08 – 2012-09-09 (×2): 3 [IU] via SUBCUTANEOUS
  Administered 2012-09-09: 8 [IU] via SUBCUTANEOUS

## 2012-08-31 NOTE — ED Notes (Signed)
Patient returned to room. 

## 2012-08-31 NOTE — ED Notes (Signed)
Report given to Sherrie George, Charity fundraiser. Patient to CDU #[id number]

## 2012-08-31 NOTE — ED Notes (Signed)
Patient transported to CT 

## 2012-08-31 NOTE — ED Notes (Signed)
Pt presents to department for evaluation of RLQ abdominal pain. Ongoing for several days. Was recently discharged from hospital. 10/10 pain at the time. History of kidney stones. Denies urinary symptoms. She is alert and oriented x4. No signs of distress at the time.

## 2012-08-31 NOTE — ED Provider Notes (Cosign Needed)
History     CSN: [id number]  Arrival date & time 08/31/12  1136   First MD Initiated Contact with Patient 08/31/12 1433      Chief Complaint  Patient presents with  . Abdominal Pain    (Consider location/radiation/quality/duration/timing/severity/associated sxs/prior treatment) Patient is a 61 y.o. female presenting with abdominal pain. The history is provided by the patient and medical records.  Abdominal Pain The primary symptoms of the illness include abdominal pain. The primary symptoms of the illness do not include fever, shortness of breath, nausea, vomiting, diarrhea or dysuria.  Symptoms associated with the illness do not include chills.   61 year old, female, with a history of cholecystectomy, and hysterectomy.  Resents to emergency department complaining of right-sided abdominal pain for the past 3 days.  She has not had nausea, vomiting, diarrhea.  She denies cough, or shortness of breath, or chest pain.  She denies urinary tract symptoms.  She states the pain is worse with palpation and lying on the right side.  She denies drinking alcohol.  Past Medical History  Diagnosis Date  . Diabetes mellitus   . Hypertension   . Pulmonary embolism   . UTI (urinary tract infection)     Past Surgical History  Procedure Date  . Abdominal hysterectomy   . Cholecystectomy   . Esophagogastroduodenoscopy 08/19/2012    Procedure: ESOPHAGOGASTRODUODENOSCOPY (EGD);  Surgeon: Vertell Novak., MD;  Location: [facility] ENDOSCOPY;  Service: Endoscopy;  Laterality: N/A;    No family history on file.  History  Substance Use Topics  . Smoking status: Current Every Day Smoker -- 0.2 packs/day for 10 years    Types: Cigarettes  . Smokeless tobacco: Not on file  . Alcohol Use: No    OB History    Grav Para Term Preterm Abortions TAB SAB Ect Mult Living                  Review of Systems  Constitutional: Negative for fever and chills.  Respiratory: Negative for cough and shortness of  breath.   Cardiovascular: Negative for chest pain.  Gastrointestinal: Positive for abdominal pain. Negative for nausea, vomiting and diarrhea.  Genitourinary: Negative for dysuria.  Neurological: Negative for headaches.  All other systems reviewed and are negative.    Allergies  Keflex; Sulfa antibiotics; and Sulfur  Home Medications   Current Outpatient Rx  Name Route Sig Dispense Refill  . AMLODIPINE BESYLATE 10 MG PO TABS Oral Take 1 tablet (10 mg total) by mouth daily. 30 tablet 2  . INSULIN GLARGINE 100 UNIT/ML Foley SOLN Subcutaneous Inject 15 Units into the skin at bedtime. 10 mL 2  . LOPERAMIDE HCL 2 MG PO CAPS Oral Take 1 capsule (2 mg total) by mouth 3 (three) times daily as needed for diarrhea or loose stools. 30 capsule 2  . LOSARTAN POTASSIUM-HCTZ 100-25 MG PO TABS Oral Take 1 tablet by mouth daily. 10 tablet 0  . METOCLOPRAMIDE HCL 5 MG PO TABS Oral Take 1 tablet (5 mg total) by mouth 4 (four) times daily -  before meals and at bedtime. 120 tablet 2  . METOPROLOL TARTRATE 50 MG PO TABS Oral Take 1 tablet (50 mg total) by mouth 2 (two) times daily. 60 tablet 2  . OMEPRAZOLE 20 MG PO CPDR Oral Take 1 capsule (20 mg total) by mouth daily. 30 capsule 0  . OXYCODONE HCL 5 MG PO TABS Oral Take 1 tablet (5 mg total) by mouth every 6 (six) hours as needed  for pain. 30 tablet 0  . PREDNISONE 20 MG PO TABS  Take 2 tablets daily for 2 days, then 1 tab daily for 2 days then stop. 6 tablet 0    BP 155/83  Pulse 78  Temp 98.3 F (36.8 C) (Oral)  Resp 20  SpO2 95%  Physical Exam  Nursing note and vitals reviewed. Constitutional: She is oriented to person, place, and time.       Morbidly obese crying  HENT:  Head: Normocephalic and atraumatic.  Eyes: Conjunctivae normal are normal.  Neck: Normal range of motion. Neck supple.  Cardiovascular: Normal rate and intact distal pulses.   No murmur heard. Pulmonary/Chest: Effort normal and breath sounds normal. No respiratory distress.   Abdominal: Soft. She exhibits no distension. There is tenderness. There is no rebound and no guarding.       Right-sided abdominal tenderness in both the right upper quadrant and right lower quadrant.  No guarding.  No rigidity  Musculoskeletal: Normal range of motion.  Neurological: She is alert and oriented to person, place, and time.  Skin: Skin is warm and dry.  Psychiatric: She has a normal mood and affect. Thought content normal.    ED Course  Procedures (including critical care time) 61 year old, female, with a history of cholecystectomy, and hysterectomy.  Presents emergency department with 3 day history of right-sided abdominal pain, and no other symptoms.  On, examination.  She is crying.  She is tender, but without abdominal rigidity.  Since she has not had vomiting.  I doubt that she has a small bowel obstruction.  We will perform laboratory testing, and a CAT scan of her abdomen.  For further evaluation  Labs Reviewed  CBC WITH DIFFERENTIAL - Abnormal; Notable for the following:    WBC 12.4 (*)     RBC 5.14 (*)     Neutro Abs 8.6 (*)     All other components within normal limits  COMPREHENSIVE METABOLIC PANEL - Abnormal; Notable for the following:    Potassium 3.3 (*)     Glucose, Bld 177 (*)     BUN 5 (*)     Albumin 2.4 (*)     Alkaline Phosphatase 174 (*)     GFR calc non Af Amer 89 (*)     All other components within normal limits  URINALYSIS, ROUTINE W REFLEX MICROSCOPIC - Abnormal; Notable for the following:    APPearance CLOUDY (*)     Glucose, UA 250 (*)     Hgb urine dipstick MODERATE (*)     Protein, ur >300 (*)     All other components within normal limits  URINE MICROSCOPIC-ADD ON - Abnormal; Notable for the following:    Squamous Epithelial / LPF MANY (*)     Casts GRANULAR CAST (*)     All other components within normal limits   No results found.   No diagnosis found.    MDM  Abdominal pain Leukocytosis       Cheri Guppy,  MD 08/31/12 1552

## 2012-08-31 NOTE — H&P (Signed)
PCP:   Sheila Oats, MD   Chief Complaint:  Right side flank  HPI: 61 y/o female who was recently discharged from the hospital after the treatment for abdominal pain, all the w/u was negative, now came back to hospital with right side flank pain. Patient had CT abdomen /pelvis done in the ER which showed splenic infarct, which was not present on the previous CT abdomen done on 08/17/12. Patient has h/o PE which was diagnosed in 2011 and recently stopped taking coumadin in April due to financial reasons. 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 She denies dysuria, urgency, of urination, denies hematuria. Denies fever.  Allergies:   Allergies  Allergen Reactions  . Keflex (Cephalexin) Itching and Swelling    Lips and eyes swelled up  . Sulfa Antibiotics Rash  . Sulfur Rash      Past Medical History  Diagnosis Date  . Diabetes mellitus   . Hypertension   . Pulmonary embolism   . UTI (urinary tract infection)     Past Surgical History  Procedure Date  . Abdominal hysterectomy   . Cholecystectomy   . Esophagogastroduodenoscopy 08/19/2012    Procedure: ESOPHAGOGASTRODUODENOSCOPY (EGD);  Surgeon: Vertell Novak., MD;  Location: [facility] ENDOSCOPY;  Service: Endoscopy;  Laterality: N/A;    Prior to Admission medications   Medication Sig Start Date End Date Taking? Authorizing Provider  amLODipine (NORVASC) 10 MG tablet Take 1 tablet (10 mg total) by mouth daily. 08/21/12 08/21/13 Yes Osvaldo Shipper, MD  insulin glargine (LANTUS) 100 UNIT/ML injection Inject 15 Units into the skin at bedtime. 08/21/12 08/21/13 Yes Osvaldo Shipper, MD  loperamide (IMODIUM) 2 MG capsule Take 1 capsule (2 mg total) by mouth 3 (three) times daily as needed for diarrhea or loose stools. 08/21/12 08/31/12 Yes Osvaldo Shipper, MD  losartan-hydrochlorothiazide (HYZAAR) 100-25 MG per tablet Take 1 tablet by mouth daily. 08/21/12  Yes Osvaldo Shipper, MD  metoCLOPramide  (REGLAN) 5 MG tablet Take 1 tablet (5 mg total) by mouth 4 (four) times daily -  before meals and at bedtime. 08/21/12 08/31/12 Yes Osvaldo Shipper, MD  metoprolol (LOPRESSOR) 50 MG tablet Take 1 tablet (50 mg total) by mouth 2 (two) times daily. 08/21/12 08/21/13 Yes Osvaldo Shipper, MD  omeprazole (PRILOSEC) 20 MG capsule Take 1 capsule (20 mg total) by mouth daily. 08/21/12 08/21/13 Yes Osvaldo Shipper, MD  oxyCODONE (OXY IR/ROXICODONE) 5 MG immediate release tablet Take 1 tablet (5 mg total) by mouth every 6 (six) hours as needed for pain. 08/21/12 08/31/12 Yes Osvaldo Shipper, MD  predniSONE (DELTASONE) 20 MG tablet Take 2 tablets daily for 2 days, then 1 tab daily for 2 days then stop. 08/21/12   Osvaldo Shipper, MD    Social History:  reports that she has been smoking Cigarettes.  She has a 2.5 pack-year smoking history. She does not have any smokeless tobacco history on file. She reports that she does not drink alcohol or use illicit drugs.   Review of Systems:  HEENT: Denies headache, blurred vision, runny nose, sore throat,  Neck: Denies thyroid problems,lymphadenopathy Chest : Denies shortness of breath, no history of COPD Heart : Denies Chest pain,  coronary arterey disease GI: Denies  nausea, vomiting, diarrhea, constipation GU: Denies dysuria, urgency, frequency of urination, hematuria Neuro: Denies stroke, seizures, syncope    Physical Exam: Blood pressure 158/87, pulse 77, temperature 98.3 F (36.8 C), temperature source Oral, resp. rate 26, SpO2 95.00%. Constitutional:  Patient is a well-developed and well-nourished  Female  In no distress and cooperative with exam. Head: Normocephalic and atraumatic Mouth: Mucus membranes moist Eyes: PERRL, EOMI, conjunctivae normal Neck: Supple, No Thyromegaly Cardiovascular: RRR, S1 normal, S2 normal Pulmonary/Chest: CTAB, no wheezes, rales, or rhonchi Abdominal: Soft. Non-tender, non-distended, bowel sounds are normal, no masses, organomegaly, or  guarding present. Positive Right CVA tenderness. Neurological: A&O x3, Strenght is normal and symmetric bilaterally, cranial nerve II-XII are grossly intact, no focal motor deficit, sensory intact to light touch bilaterally.  Extremities : No Cyanosis, Clubbing or Edema   Labs on Admission:  Results for orders placed during the hospital encounter of 08/31/12 (from the past 48 hour(s))  CBC WITH DIFFERENTIAL     Status: Abnormal   Collection Time   08/31/12 11:57 AM      Component Value Range Comment   WBC 12.4 (*) 4.0 - 10.5 K/uL    RBC 5.14 (*) 3.87 - 5.11 MIL/uL    Hemoglobin 14.4  12.0 - 15.0 g/dL    HCT 16.1  09.6 - 04.5 %    MCV 83.5  78.0 - 100.0 fL    MCH 28.0  26.0 - 34.0 pg    MCHC 33.6  30.0 - 36.0 g/dL    RDW 40.9  81.1 - 91.4 %    Platelets 360  150 - 400 K/uL    Neutrophils Relative 70  43 - 77 %    Neutro Abs 8.6 (*) 1.7 - 7.7 K/uL    Lymphocytes Relative 21  12 - 46 %    Lymphs Abs 2.6  0.7 - 4.0 K/uL    Monocytes Relative 7  3 - 12 %    Monocytes Absolute 0.9  0.1 - 1.0 K/uL    Eosinophils Relative 2  0 - 5 %    Eosinophils Absolute 0.3  0.0 - 0.7 K/uL    Basophils Relative 0  0 - 1 %    Basophils Absolute 0.0  0.0 - 0.1 K/uL   COMPREHENSIVE METABOLIC PANEL     Status: Abnormal   Collection Time   08/31/12 11:57 AM      Component Value Range Comment   Sodium 138  135 - 145 mEq/L    Potassium 3.3 (*) 3.5 - 5.1 mEq/L    Chloride 101  96 - 112 mEq/L    CO2 28  19 - 32 mEq/L    Glucose, Bld 177 (*) 70 - 99 mg/dL    BUN 5 (*) 6 - 23 mg/dL    Creatinine, Ser 7.82  0.50 - 1.10 mg/dL    Calcium 9.5  8.4 - 95.6 mg/dL    Total Protein 7.1  6.0 - 8.3 g/dL    Albumin 2.4 (*) 3.5 - 5.2 g/dL    AST 13  0 - 37 U/L    ALT 30  0 - 35 U/L    Alkaline Phosphatase 174 (*) 39 - 117 U/L    Total Bilirubin 0.3  0.3 - 1.2 mg/dL    GFR calc non Af Amer 89 (*) >90 mL/min    GFR calc Af Amer >90  >90 mL/min   URINALYSIS, ROUTINE W REFLEX MICROSCOPIC     Status: Abnormal    Collection Time   08/31/12 12:13 PM      Component Value Range Comment   Color, Urine YELLOW  YELLOW    APPearance CLOUDY (*) CLEAR    Specific Gravity, Urine 1.016  1.005 - 1.030  pH 7.0  5.0 - 8.0    Glucose, UA 250 (*) NEGATIVE mg/dL    Hgb urine dipstick MODERATE (*) NEGATIVE    Bilirubin Urine NEGATIVE  NEGATIVE    Ketones, ur NEGATIVE  NEGATIVE mg/dL    Protein, ur >130 (*) NEGATIVE mg/dL    Urobilinogen, UA 0.2  0.0 - 1.0 mg/dL    Nitrite NEGATIVE  NEGATIVE    Leukocytes, UA NEGATIVE  NEGATIVE   URINE MICROSCOPIC-ADD ON     Status: Abnormal   Collection Time   08/31/12 12:13 PM      Component Value Range Comment   Squamous Epithelial / LPF MANY (*) RARE    WBC, UA 0-2  <3 WBC/hpf    RBC / HPF 3-6  <3 RBC/hpf    Bacteria, UA RARE  RARE    Casts GRANULAR CAST (*) NEGATIVE   PROTIME-INR     Status: Normal   Collection Time   08/31/12  5:53 PM      Component Value Range Comment   Prothrombin Time 13.8  11.6 - 15.2 seconds    INR 1.04  0.00 - 1.49   APTT     Status: Normal   Collection Time   08/31/12  5:53 PM      Component Value Range Comment   aPTT 29  24 - 37 seconds     Radiological Exams on Admission: Ct Abdomen Pelvis W Contrast  08/31/2012  *RADIOLOGY REPORT*  Clinical Data: Right lower quadrant pain  CT ABDOMEN AND PELVIS WITH CONTRAST  Technique:  Multidetector CT imaging of the abdomen and pelvis was performed following the standard protocol during bolus administration of intravenous contrast.  Contrast: 80mL OMNIPAQUE IOHEXOL 300 MG/ML  SOLN  Comparison: 08/17/2012  Findings: Sagittal images of the spine shows no destructive bony lesions. Stable degenerative changes thoracolumbar spine.  The study is limited by patient's large body habitus.  Lung bases are unremarkable.  Liver, pancreas and adrenal glands are unremarkable.  There is trace right pleural effusion.  There is wedge  decreased attenuation lateral superior aspect of the spleen measures 3.6 x 5.5 cm  consistent with interval splenic infarct  No perisplenic fluid or hematoma.  Kidneys are symmetrical in size and enhancement.  Probable vascular calcifications are noted bilateral renal hilum.  There is a vascular calcifications or nonobstructive calculus in mid pole of the right kidney measures 2.5 mm.  No hydronephrosis or hydroureter.  No calcified ureteral calculi are noted. Atherosclerotic calcifications of the abdominal aorta and the iliac arteries.  SMA calcifications are noted.  There is no evidence of aortic aneurysm.  Atherosclerotic calcifications are noted left renal artery origin.  Oral contrast material was given to the patient.  No small bowel obstruction.  No ascites or free air.  No adenopathy.  Stool noted in the right colon.  There is no pericecal inflammation.  Normal appendix is partially visualized in axial image 59.  Normal appendix is clearly visualized in coronal image 79.  The patient is status post cholecystectomy.  The patient is status post hysterectomy.  The urinary bladder is unremarkable.  No destructive bony lesions are noted within pelvis.  Coronal images shows mild degenerative changes bilateral SI joints.  Delayed renal images shows bilateral renal symmetrical excretion. There is a tiny cyst in the upper pole of the left kidney measures 7 mm.  Tiny cyst lower pole of the left kidney measures 7 mm.  Tiny cyst lower pole of the right kidney measures 4  mm.  Bilateral visualized proximal ureter is unremarkable.  IMPRESSION:  1. Trace right pleural effusion. 2.  There is interval wedge-shaped low density lesion lateral superior aspect of the spleen measures 5.5 x 3.6 cm consistent with interval splenic infarct.  No perisplenic fluid or hematoma. 3.  No hydronephrosis or hydroureter. 4.  No pericecal inflammation.  Normal appendix. 5.  Status post hysterectomy. 6.  Degenerative changes thoracolumbar spine.   Original Report Authenticated By: Natasha Mead, M.D.      Assessment/Plan Active Problems:  * No active hospital problems. 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 Called hematologist on call Dr Darrold Span and discussed on phone. She recommends to ger hypercoagulable work up and start therapeutic lovenox. patient has h/o PE in past. Will also obtain 2 d echo. Flank pain/back pain CT abdomen/pelvis shows ? Small nonobstructive stones in the right kidney. UA is negative, will obtain the records from the [facility] [place], as she had work up done for disc herniation in past. Will continue with oxycontin which she has been taking at home.  Hypertension Will start anti hypertensive medications   Diabetes mellitus SSI, lantus    Time Spent on Admission: 70 min  Analyssa Downs S [facility] Pager: [phone] 08/31/2012, 7:22 PM

## 2012-08-31 NOTE — ED Provider Notes (Signed)
Care assumed from Dr. Nino Parsley at shift change.  I agree with his note, assessment, and plan.  The patient is awaiting a ct of the abdomen and pelvis.  This returned showing a splenic infarct that was new from the ct from 2 weeks ago.  This raises my concern for an evolving liver infarct that may be embolic in nature.  She is supposed to be on Coumadin, however she stopped this due to moving away from her local pcp in [place] and could not get it filled.  I have spoken with Dr. Donell Beers who does not believe this is surgical.  I have spoken with Dr. Darnelle Catalan from [facility] who agrees to admit the patient.  Geoffery Lyons, MD 08/31/12 [phone]

## 2012-08-31 NOTE — Progress Notes (Signed)
ANTICOAGULATION CONSULT NOTE - Initial Consult  Pharmacy Consult for Lovenox Indication: splenic infarct  Allergies  Allergen Reactions  . Keflex (Cephalexin) Itching and Swelling    Lips and eyes swelled up  . Sulfa Antibiotics Rash  . Sulfur Rash    Patient Measurements: Height: 5' 8.9" (175 cm) Weight: 239 lb 3.2 oz (108.5 kg) IBW/kg (Calculated) : 65.97    Vital Signs: Temp: 98.4 F (36.9 C) (09/11 2100) Temp src: Oral (09/11 2100) BP: 157/81 mmHg (09/11 2100) Pulse Rate: 75  (09/11 2100)  Labs:  Basename 08/31/12 1753 08/31/12 1157  HGB -- 14.4  HCT -- 42.9  PLT -- 360  APTT 29 --  LABPROT 13.8 --  INR 1.04 --  HEPARINUNFRC -- --  CREATININE -- 0.74  CKTOTAL -- --  CKMB -- --  TROPONINI -- --    Estimated Creatinine Clearance: 95.5 ml/min (by C-G formula based on Cr of 0.74).   Medical History: Past Medical History  Diagnosis Date  . Diabetes mellitus   . Hypertension   . Pulmonary embolism   . UTI (urinary tract infection)     Medications:  Prescriptions prior to admission  Medication Sig Dispense Refill  . amLODipine (NORVASC) 10 MG tablet Take 1 tablet (10 mg total) by mouth daily.  30 tablet  2  . insulin glargine (LANTUS) 100 UNIT/ML injection Inject 15 Units into the skin at bedtime.  10 mL  2  . loperamide (IMODIUM) 2 MG capsule Take 1 capsule (2 mg total) by mouth 3 (three) times daily as needed for diarrhea or loose stools.  30 capsule  2  . losartan-hydrochlorothiazide (HYZAAR) 100-25 MG per tablet Take 1 tablet by mouth daily.  10 tablet  0  . metoCLOPramide (REGLAN) 5 MG tablet Take 1 tablet (5 mg total) by mouth 4 (four) times daily -  before meals and at bedtime.  120 tablet  2  . metoprolol (LOPRESSOR) 50 MG tablet Take 1 tablet (50 mg total) by mouth 2 (two) times daily.  60 tablet  2  . omeprazole (PRILOSEC) 20 MG capsule Take 1 capsule (20 mg total) by mouth daily.  30 capsule  0  . oxyCODONE (OXY IR/ROXICODONE) 5 MG immediate  release tablet Take 1 tablet (5 mg total) by mouth every 6 (six) hours as needed for pain.  30 tablet  0  . predniSONE (DELTASONE) 20 MG tablet Take 2 tablets daily for 2 days, then 1 tab daily for 2 days then stop.  6 tablet  0    Assessment: 61 y/o female patient admitted with abdominal pain, found to have splenic infarct requiring anticoagulation. H/o PE but has been off coumadin since April.  Goal of Therapy:  Anti-Xa level 0.6-1.2 units/ml 4hrs after LMWH dose given Monitor platelets by anticoagulation protocol: Yes   Plan:  Lovenox 110mg  sq q12h, cbc q72h and monitor renal fxn.  Verlene Mayer, PharmD, BCPS Pager [phone] 08/31/2012,9:20 PM

## 2012-08-31 NOTE — ED Notes (Signed)
MD at bedside. 

## 2012-09-01 DIAGNOSIS — M549 Dorsalgia, unspecified: Secondary | ICD-10-CM

## 2012-09-01 DIAGNOSIS — D7389 Other diseases of spleen: Secondary | ICD-10-CM | POA: Diagnosis not present

## 2012-09-01 DIAGNOSIS — G8929 Other chronic pain: Secondary | ICD-10-CM | POA: Diagnosis not present

## 2012-09-01 DIAGNOSIS — I742 Embolism and thrombosis of arteries of the upper extremities: Secondary | ICD-10-CM | POA: Diagnosis not present

## 2012-09-01 DIAGNOSIS — R109 Unspecified abdominal pain: Secondary | ICD-10-CM | POA: Diagnosis not present

## 2012-09-01 DIAGNOSIS — E119 Type 2 diabetes mellitus without complications: Secondary | ICD-10-CM

## 2012-09-01 DIAGNOSIS — Z86711 Personal history of pulmonary embolism: Secondary | ICD-10-CM | POA: Diagnosis not present

## 2012-09-01 LAB — GLUCOSE, CAPILLARY
Glucose-Capillary: 191 mg/dL — ABNORMAL HIGH (ref 70–99)
Glucose-Capillary: 171 mg/dL — ABNORMAL HIGH (ref 70–99)
Glucose-Capillary: 161 mg/dL — ABNORMAL HIGH (ref 70–99)

## 2012-09-01 LAB — HEMOGLOBIN A1C: Mean Plasma Glucose: 252 mg/dL — ABNORMAL HIGH (ref <117)

## 2012-09-01 LAB — ANTITHROMBIN III: AntiThromb III Func: 108 % (ref 75–120)

## 2012-09-01 MED ORDER — POTASSIUM CHLORIDE CRYS ER 20 MEQ PO TBCR
40.0000 meq | EXTENDED_RELEASE_TABLET | Freq: Once | ORAL | Status: AC
  Administered 2012-09-01: 40 meq via ORAL
  Filled 2012-09-01: qty 2

## 2012-09-01 MED ORDER — WARFARIN SODIUM 7.5 MG PO TABS
7.5000 mg | ORAL_TABLET | Freq: Once | ORAL | Status: AC
  Administered 2012-09-01: 7.5 mg via ORAL
  Filled 2012-09-01: qty 1

## 2012-09-01 MED ORDER — WARFARIN VIDEO
Freq: Once | Status: AC
  Administered 2012-09-01: 18:00:00

## 2012-09-01 MED ORDER — WARFARIN - PHARMACIST DOSING INPATIENT: Freq: Every day | Status: DC

## 2012-09-01 MED ORDER — PATIENT'S GUIDE TO USING COUMADIN BOOK
Freq: Once | Status: AC
  Administered 2012-09-01: 18:00:00
  Filled 2012-09-01: qty 1

## 2012-09-01 NOTE — Consult Note (Signed)
ONCOLOGY  HOSPITAL CONSULTATION NOTE  Joy Butler                                MR#: [id number]  DOB: 10/13/1951                               CSN#: [id number]  Referring MD: Dr. Sharl Ma ([facility]) Primary MD: None  Reason for Consult: Rule Out Hypercoagulable State   Joy Butler is a 61 y.o. African American  female smoker from [place], [place], recently moving to [place] 2 months ago, with a history of PE diagnosed on 2011,treated at [facility] in [place], requiring coumadinization for about a year,but not completing her therapy, stopping coumadin in 03/2011 due to financial hardships, and  with a recent hospitalization from 8/28-08/21/2012 for crampy abdominal pain and diarrhea with negative GI and surgical workup, diagnosed with  UTI complicated with air in urinary bladder treated with Fosfomycin by ID service, who was readmitted on 9/11 after presenting with right flank pain. While the CT of the A/P on 8/28 showed no acute abnormalities, the CT of the abdomen and pelvis on 08/31/2012 with contrast taken during this adnmission revealed interval wedge-shaped low density lesion lateral superior aspect of the spleen measuring 5.5 x 3.6 cm consistent with interval splenic infarct. She had no  fever, dysuria,urgency or hematuria on admission. Her PT on 08/31/2012 was 13.8, with an INR of 1.04 and a PTT of 29. Her hemoglobin and hematocrit was 14.4 and 42.9 respectively, white count was 12.4, increased since 07/3012 at which time it was 8.0, and her platelets were 360.hypercoagulable panel is currently pending, available data at this time includes Anti-thrombin III F at 108. Records have been ordered for review and details.Patient is  currently on Lovenox 110 mg subcutaneous every 12 hours since 08/31/2012 by pharmacy. Patient denies any recent long trips.  No hormonal replacement . No recent surgeries. No family history of hypercoagulable disorder.  We were asked to see the  patient  With recommendationsanticipating that she will need outpatient follow up upon discharge.  PMH:  Past Medical History  Diagnosis Date  . Diabetes mellitus   . Hypertension   . Pulmonary embolism 2011  . UTI (urinary tract infection)     Surgeries:  Past Surgical History  Procedure Date  . Abdominal hysterectomy   . Cholecystectomy   . Esophagogastroduodenoscopy 08/19/2012    Procedure: ESOPHAGOGASTRODUODENOSCOPY (EGD);  Surgeon: Joy Butler., MD;  Location: [facility];  Service: Endoscopy;  Laterality: N/A;    Allergies:  Allergies  Allergen Reactions  . Keflex (Cephalexin) Itching and Swelling    Lips and eyes swelled up  . Latex Itching  . Sulfa Antibiotics Rash  . Sulfur Rash    Medications:   Prior to Admission:  Prescriptions prior to admission  Medication Sig Dispense Refill  . amLODipine (NORVASC) 10 MG tablet Take 1 tablet (10 mg total) by mouth daily.  30 tablet  2  . insulin glargine (LANTUS) 100 UNIT/ML injection Inject 15 Units into the skin at bedtime.  10 mL  2  . loperamide (IMODIUM) 2 MG capsule Take 1 capsule (2 mg total) by mouth 3 (three) times daily as needed for diarrhea or loose stools.  30 capsule  2  . losartan-hydrochlorothiazide (HYZAAR) 100-25 MG per tablet Take 1 tablet by mouth daily.  10 tablet  0  . metoCLOPramide (REGLAN) 5 MG tablet Take 1 tablet (5 mg total) by mouth 4 (four) times daily -  before meals and at bedtime.  120 tablet  2  . metoprolol (LOPRESSOR) 50 MG tablet Take 1 tablet (50 mg total) by mouth 2 (two) times daily.  60 tablet  2  . omeprazole (PRILOSEC) 20 MG capsule Take 1 capsule (20 mg total) by mouth daily.  30 capsule  0  . oxyCODONE (OXY IR/ROXICODONE) 5 MG immediate release tablet Take 1 tablet (5 mg total) by mouth every 6 (six) hours as needed for pain.  30 tablet  0  . predniSONE (DELTASONE) 20 MG tablet Take 2 tablets daily for 2 days, then 1 tab daily for 2 days then stop.  6 tablet  0     ZOX:WRUEAVW, oxyCODONE  ROS: Constitutional:Negative for weight loss. Negative for fever, chills and malaise/fatigue.  Eyes: Negative for blurred vision and double vision.  Respiratory: Negative for cough. No hemoptysis. No shortness of breath. No pleuritic chest pain.  Cardiovascular: Negative for chest pain. No palpitations.  GI: No nausea, vomiting, diarrhea, constipation. No change in bowel caliber. No  Melena or hematochezia. Main complaint is R flank pain since admission, alleviated only with pain meds. .   GU: No blood in urine. No loss of urinary control. Skin: Negative for itching. No rash. No petechia. No easy  bruising Neurological: No headaches. No motor deficits. 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  Family History:    Patient does not know her family history.    Social History: She smokes 1/3 of a pack a day for about 10 years.  She does not have any smokeless tobacco history on file. She reports that she does not drink alcohol or use illicit drugs. Patient is single. Former Midwife, now retired. Recently moved from [place].   Physical Exam    Filed Vitals:   09/01/12 0413  BP: 150/71  Pulse: 79  Temp: 99.1 F (37.3 C)  Resp: 18      General:  45 -year-old  in no acute distress A. and O. x3  well-developed and well-nourished.  HEENT: Normocephalic, atraumatic, PERRLA. Oral cavity without thrush or lesions. Neck supple. no thyromegaly, no cervical or supraclavicular adenopathy  Lungs clear bilaterally . No wheezing, rhonchi or rales. No axillary masses. Breasts: not examined. Cardiac regular rate and rhythm normal S1-S2, no murmur , rubs or gallops Abdomen  Obese, soft nontender , bowel sounds x4. No HSM. No masses palpable.  GU/rectal: deferred. Extremities no clubbing cyanosis or edema. No bruising or petechial rash Musculoskeletal: no spinal tenderness.  Neuro: Non Focal  Labs:  CBC   Lab 08/31/12 1157  WBC 12.4*  HGB 14.4   HCT 42.9  PLT 360  MCV 83.5  MCH 28.0  MCHC 33.6  RDW 15.3  LYMPHSABS 2.6  MONOABS 0.9  EOSABS 0.3  BASOSABS 0.0  BANDABS --     CMP    Lab 08/31/12 1157  NA 138  K 3.3*  CL 101  CO2 28  GLUCOSE 177*  BUN 5*  CREATININE 0.74  CALCIUM 9.5  MG --  AST 13  ALT 30  ALKPHOS 174*  BILITOT 0.3        Component Value Date/Time   BILITOT 0.3 08/31/2012 1157      Lab 08/31/12 1753  INR 1.04  PROTIME --    No results found for this basename: DDIMER:2 in the last 72  hours     Imaging Studies:   Ct Abdomen Pelvis W Contrast  08/31/2012   Comparison: 08/17/2012  Findings: Sagittal images of the spine shows no destructive bony lesions. Stable degenerative changes thoracolumbar spine.  The study is limited by patient's large body habitus.  Lung bases are unremarkable.  Liver, pancreas and adrenal glands are unremarkable.  There is trace right pleural effusion.  There is wedge  decreased attenuation lateral superior aspect of the spleen measures 3.6 x 5.5 cm consistent with interval splenic infarct  No perisplenic fluid or hematoma.  Kidneys are symmetrical in size and enhancement.  Probable vascular calcifications are noted bilateral renal hilum.  There is a vascular calcifications or nonobstructive calculus in mid pole of the right kidney measures 2.5 mm.  No hydronephrosis or hydroureter.  No calcified ureteral calculi are noted. Atherosclerotic calcifications of the abdominal aorta and the iliac arteries.  SMA calcifications are noted.  There is no evidence of aortic aneurysm.  Atherosclerotic calcifications are noted left renal artery origin.  Oral contrast material was given to the patient.  No small bowel obstruction.  No ascites or free air.  No adenopathy.  Stool noted in the right colon.  There is no pericecal inflammation.  Normal appendix is partially visualized in axial image 59.  Normal appendix is clearly visualized in coronal image 79.  The patient is status  post cholecystectomy.  The patient is status post hysterectomy.  The urinary bladder is unremarkable.  No destructive bony lesions are noted within pelvis.  Coronal images shows mild degenerative changes bilateral SI joints.  Delayed renal images shows bilateral renal symmetrical excretion. There is a tiny cyst in the upper pole of the left kidney measures 7 mm.  Tiny cyst lower pole of the left kidney measures 7 mm.  Tiny cyst lower pole of the right kidney measures 4 mm.  Bilateral visualized proximal ureter is unremarkable.  IMPRESSION:  1. Trace right pleural effusion. 2.  There is interval wedge-shaped low density lesion lateral superior aspect of the spleen measures 5.5 x 3.6 cm consistent with interval splenic infarct.  No perisplenic fluid or hematoma. 3.  No hydronephrosis or hydroureter. 4.  No pericecal inflammation.  Normal appendix. 5.  Status post hysterectomy. 6.  Degenerative changes thoracolumbar spine.   Original Report Authenticated By: Natasha Mead, M.D.    Ct Abdomen Pelvis W Contrast  08/17/2012  Comparison: None.  Findings: There is significant streak artifact secondary to patient contact with the gantry.  Minimal dependent scarring versus atelectasis, right greater than left.  Heart size upper normal.  No pleural or pericardial effusion.  Low attenuation of the liver is nonspecific post contrast however suggests fatty infiltration.  Absent gallbladder.  No biliary ductal dilatation.  Unremarkable spleen, pancreas, adrenal glands.  Nonobstructing renal stones versus vascular calcifications.  Too small further characterize hypodensities within the kidneys. No hydronephrosis or hydroureter.  No bowel obstruction.  No CT evidence for colitis.  Normal appendix.  No free intraperitoneal air or fluid.  No lymphadenopathy.  Circumaortic left renal vein. There is scattered atherosclerotic calcification of the aorta and its branches. No aneurysmal dilatation.  Decompressed bladder with air non  dependently. Diminutive or absent uterus.  No adnexal mass.  Multilevel degenerative changes of the imaged spine. No acute or aggressive appearing osseous lesion.  IMPRESSION: Air within the bladder is nonspecific.  Correlate with recent history of instrumentation. In the absence of instrumentation, differential would include fistulous communication with bowel.  Hepatic steatosis.  Nonobstructing renal stones versus vascular calcifications.   Original Report Authenticated By: Waneta Martins, M.D.    Ct Head Wo Contrast  08/17/2012 .  Comparison: None.  Findings: Periventricular and subcortical white matter hypodensities are most in keeping with chronic microangiopathic change. There is no evidence for acute hemorrhage, hydrocephalus, mass lesion, or abnormal extra-axial fluid collection.  No definite CT evidence for acute infarction.  The visualized paranasal sinuses and mastoid air cells are predominately clear.  No displaced calvarial fracture.  IMPRESSION: White matter changes as above.  No definite acute intracranial abnormality.   Original Report Authenticated By: Waneta Martins, M.D.     Dg Chest Portable 1 View  08/16/2012  Comparison: None.  Findings: Heart size upper normal to mildly enlarged. Right hemidiaphragm mildly elevated with mild lung base opacity. Otherwise, no focal consolidation, pleural effusion, or pneumothorax.  No acute osseous finding.  IMPRESSION: Heart size upper normal.  Minimal lung base opacity, likely atelectasis.   Original Report Authenticated By: Waneta Martins, M.D.       A/P: 61 y.o. female with a prior history of PE, on Coumadin until 03/2011, discontinuing med due to financial issues, admitted on 08/31/2012 with right flank pain, found to have her abdomen and CT splenic infarction requiring anti-coagulation in the form of Lovenox 110 mg subcutaneous Q. 12 hours per pharmacy. Prior to the administration of anticoagulant, the patient had a hypercoagulable  panel drawn, which results are currently pending. We were requested to evaluate the patient, anticipating that she will need outpatient follow up at our clinic since she may need long term anticoagulation.  Dr. Welton Flakes  is to see the patient following this consult with recommendations regarding diagnosis, treatment options and further workup studies.  Thank you for the referral.  [facility] 09/01/2012 8:43 AM    ATTENDING'S ATTESTATION:  I personally reviewed patient's chart, examined patient myself, formulated the treatment plan as followed.   Agree with recommendation of hypercoag panel that was already drawn. At this time she can continue lovenox.She can begin coumadin in 1 -2 days. I also would recommend a repeat CT abdomen to make sure that she is reinfarcting or that there is no progression.   I would be happy to see her back as an outpatient if she is discharged over the next few days.  Thank you for the consult.  Drue Second, MD Medical/Oncology [facility] [phone] (beeper) [phone] (Office)  09/01/2012, 4:10 PM

## 2012-09-01 NOTE — Progress Notes (Signed)
Subjective: Patient seen and examined, admitted for splenic infarct. Started on anticoagulation with lovenox. Hypercoagulable  Panel has been obtained, results are pending.   Objective: Vital signs in last 24 hours: Temp:  [98.3 F (36.8 C)-99.1 F (37.3 C)] 99.1 F (37.3 C) (09/12 0413) Pulse Rate:  [75-86] 79  (09/12 0413) Resp:  [18-26] 18  (09/12 0413) BP: (132-158)/(71-87) 150/71 mmHg (09/12 0413) SpO2:  [93 %-96 %] 93 % (09/12 0413) Weight:  [108.5 kg (239 lb 3.2 oz)] 108.5 kg (239 lb 3.2 oz) (09/11 2100) Weight change:  Last BM Date: 08/31/12  Intake/Output from previous day: 09/11 0701 - 09/12 0700 In: 282 [P.O.:120; I.V.:162] Out: -  Total I/O In: 240 [P.O.:240] Out: -    Physical Exam: Head: Normocephalic, atraumatic.  Eyes: No signs of jaundice, EOMI Nose: Mucous membranes dry.  Neck: supple,No deformities, masses, or tenderness noted. Lungs: Normal respiratory effort. B/L Clear to auscultation, no crackles or wheezes.  Heart: Regular RR. S1 and S2 normal  Abdomen: BS normoactive. Soft, Nondistended, non-tender.  Extremities: No pretibial edema, no erythema   Lab Results: Basic Metabolic Panel:  Basename 08/31/12 1157  NA 138  K 3.3*  CL 101  CO2 28  GLUCOSE 177*  BUN 5*  CREATININE 0.74  CALCIUM 9.5  MG --  PHOS --   Liver Function Tests:  Basename 08/31/12 1157  AST 13  ALT 30  ALKPHOS 174*  BILITOT 0.3  PROT 7.1  ALBUMIN 2.4*   No results found for this basename: LIPASE:2,AMYLASE:2 in the last 72 hours No results found for this basename: AMMONIA:2 in the last 72 hours CBC:  Basename 08/31/12 1157  WBC 12.4*  NEUTROABS 8.6*  HGB 14.4  HCT 42.9  MCV 83.5  PLT 360   Cardiac Enzymes: No results found for this basename: CKTOTAL:3,CKMB:3,CKMBINDEX:3,TROPONINI:3 in the last 72 hours BNP: No results found for this basename: PROBNP:3 in the last 72 hours D-Dimer: No results found for this basename: DDIMER:2 in the last 72  hours CBG:  Basename 09/01/12 0753 08/31/12 2150  GLUCAP 157* 155*   Hemoglobin A1C:  Basename 08/31/12 2207  HGBA1C 10.4*   Fasting Lipid Panel: No results found for this basename: CHOL,HDL,LDLCALC,TRIG,CHOLHDL,LDLDIRECT in the last 72 hours Thyroid Function Tests: No results found for this basename: TSH,T4TOTAL,FREET4,T3FREE,THYROIDAB in the last 72 hours Anemia Panel: No results found for this basename: VITAMINB12,FOLATE,FERRITIN,TIBC,IRON,RETICCTPCT in the last 72 hours Coagulation:  Basename 08/31/12 1753  LABPROT 13.8  INR 1.04   Urine Drug Screen: Drugs of Abuse     Component Value Date/Time   LABOPIA POSITIVE* 08/17/2012 1149   COCAINSCRNUR NONE DETECTED 08/17/2012 1149   LABBENZ NONE DETECTED 08/17/2012 1149   AMPHETMU NONE DETECTED 08/17/2012 1149   THCU NONE DETECTED 08/17/2012 1149   LABBARB NONE DETECTED 08/17/2012 1149    Alcohol Level: No results found for this basename: ETH:2 in the last 72 hours Urinalysis:  Basename 08/31/12 1213  COLORURINE YELLOW  LABSPEC 1.016  PHURINE 7.0  GLUCOSEU 250*  HGBUR MODERATE*  BILIRUBINUR NEGATIVE  KETONESUR NEGATIVE  PROTEINUR >300*  UROBILINOGEN 0.2  NITRITE NEGATIVE  LEUKOCYTESUR NEGATIVE    No results found for this or any previous visit (from the past 240 hour(s)).  Studies/Results: Ct Abdomen Pelvis W Contrast  08/31/2012  *RADIOLOGY REPORT*  Clinical Data: Right lower quadrant pain  CT ABDOMEN AND PELVIS WITH CONTRAST  Technique:  Multidetector CT imaging of the abdomen and pelvis was performed following the standard protocol during bolus administration of intravenous  contrast.  Contrast: 80mL OMNIPAQUE IOHEXOL 300 MG/ML  SOLN  Comparison: 08/17/2012  Findings: Sagittal images of the spine shows no destructive bony lesions. Stable degenerative changes thoracolumbar spine.  The study is limited by patient's large body habitus.  Lung bases are unremarkable.  Liver, pancreas and adrenal glands are unremarkable.   There is trace right pleural effusion.  There is wedge  decreased attenuation lateral superior aspect of the spleen measures 3.6 x 5.5 cm consistent with interval splenic infarct  No perisplenic fluid or hematoma.  Kidneys are symmetrical in size and enhancement.  Probable vascular calcifications are noted bilateral renal hilum.  There is a vascular calcifications or nonobstructive calculus in mid pole of the right kidney measures 2.5 mm.  No hydronephrosis or hydroureter.  No calcified ureteral calculi are noted. Atherosclerotic calcifications of the abdominal aorta and the iliac arteries.  SMA calcifications are noted.  There is no evidence of aortic aneurysm.  Atherosclerotic calcifications are noted left renal artery origin.  Oral contrast material was given to the patient.  No small bowel obstruction.  No ascites or free air.  No adenopathy.  Stool noted in the right colon.  There is no pericecal inflammation.  Normal appendix is partially visualized in axial image 59.  Normal appendix is clearly visualized in coronal image 79.  The patient is status post cholecystectomy.  The patient is status post hysterectomy.  The urinary bladder is unremarkable.  No destructive bony lesions are noted within pelvis.  Coronal images shows mild degenerative changes bilateral SI joints.  Delayed renal images shows bilateral renal symmetrical excretion. There is a tiny cyst in the upper pole of the left kidney measures 7 mm.  Tiny cyst lower pole of the left kidney measures 7 mm.  Tiny cyst lower pole of the right kidney measures 4 mm.  Bilateral visualized proximal ureter is unremarkable.  IMPRESSION:  1. Trace right pleural effusion. 2.  There is interval wedge-shaped low density lesion lateral superior aspect of the spleen measures 5.5 x 3.6 cm consistent with interval splenic infarct.  No perisplenic fluid or hematoma. 3.  No hydronephrosis or hydroureter. 4.  No pericecal inflammation.  Normal appendix. 5.  Status post  hysterectomy. 6.  Degenerative changes thoracolumbar spine.   Original Report Authenticated By: Natasha Mead, M.D.     Medications: Scheduled Meds:   . sodium chloride   Intravenous STAT  . amLODipine  10 mg Oral Daily  . enoxaparin (LOVENOX) injection  110 mg Subcutaneous Q12H  . hydrochlorothiazide  25 mg Oral Daily  .  HYDROmorphone (DILAUDID) injection  0.5 mg Intravenous Once  .  HYDROmorphone (DILAUDID) injection  1 mg Intravenous Once  . insulin aspart  0-15 Units Subcutaneous TID WC  . insulin glargine  15 Units Subcutaneous QHS  . losartan  100 mg Oral Daily  . metoCLOPramide  5 mg Oral TID AC & HS  . metoprolol  50 mg Oral BID  . ondansetron (ZOFRAN) IV  4 mg Intravenous Once  . pantoprazole  40 mg Oral Q1200  . patient's guide to using coumadin book   Does not apply Once  . sodium chloride  3 mL Intravenous Q12H  . warfarin  7.5 mg Oral ONCE-1800  . warfarin   Does not apply Once  . Warfarin - Pharmacist Dosing Inpatient   Does not apply q1800  . DISCONTD: iohexol  20 mL Oral Q1 Hr x 2  . DISCONTD: losartan-hydrochlorothiazide  1 tablet Oral Daily   Continuous  Infusions:   . 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 Has been started on anticoagulation with lovenox and coumadin Work up is in progress for hypercoagulable state. Plan was discussed with Haem/onc on call Dr Darrold Span.  Left flank pain Pain is better with opioids. CT Abdomen pelvis is negative for pyelonephritis Awaiting records from [facility] for possible disc herniation  Diabetes Mellitus Continue sliding scale insulin  Hypokalemia Replace potassium  Dhyana Bastone S [facility] Pager: [phone] 09/01/2012, 11:30 AM

## 2012-09-01 NOTE — Progress Notes (Signed)
  Echocardiogram 2D Echocardiogram has been performed.  Joy Butler 09/01/2012, 3:29 PM

## 2012-09-01 NOTE — Progress Notes (Signed)
ANTICOAGULATION CONSULT NOTE - follow up  Pharmacy Consult for Lovenox and coumadin Indication: splenic infarct  Allergies  Allergen Reactions  . Keflex (Cephalexin) Itching and Swelling    Lips and eyes swelled up  . Latex Itching  . Sulfa Antibiotics Rash  . Sulfur Rash    Patient Measurements: Height: 5' 8.9" (175 cm) Weight: 239 lb 3.2 oz (108.5 kg) IBW/kg (Calculated) : 65.97    Vital Signs: Temp: 99.1 F (37.3 C) (09/12 0413) Temp src: Oral (09/12 0413) BP: 150/71 mmHg (09/12 0413) Pulse Rate: 79  (09/12 0413)  Labs:  Basename 08/31/12 1753 08/31/12 1157  HGB -- 14.4  HCT -- 42.9  PLT -- 360  APTT 29 --  LABPROT 13.8 --  INR 1.04 --  HEPARINUNFRC -- --  CREATININE -- 0.74  CKTOTAL -- --  CKMB -- --  TROPONINI -- --    Estimated Creatinine Clearance: 95.5 ml/min (by C-G formula based on Cr of 0.74).   Assessment: 61 y/o AA F admitted with abdominal pain, found to have splenic infarct requiring anticoagulation. H/o PE but has been off coumadin since April of 2012.  Stopped coumadin 2nd financial concerns.  Prior to administration of anticoagulant a hypercoagulable panel was drawn, which is pending.   Baseline INR WNL.  Coumadin score = 7. Alb = 2.4 CBC WNL.    Goal of Therapy:  Anti-Xa level 0.6-1.2 units/ml 4hrs after LMWH dose given Monitor platelets by anticoagulation protocol: Yes INR 2-3   Plan:  1. Continue Lovenox 110mg  sq q12h 2. Coumadin 7.5 mg po x 1 dose today 3. Coumadin book and video ordered. She was on coumadin from 2011 until 03/2011 for PE. 4. F/u INR, CBC q72hr, renal function. Herby Abraham, Pharm.D. [phone] 09/01/2012 9:51 AM

## 2012-09-02 ENCOUNTER — Inpatient Hospital Stay (HOSPITAL_COMMUNITY): Payer: MEDICARE

## 2012-09-02 DIAGNOSIS — R10819 Abdominal tenderness, unspecified site: Secondary | ICD-10-CM | POA: Diagnosis not present

## 2012-09-02 DIAGNOSIS — M545 Low back pain: Secondary | ICD-10-CM | POA: Diagnosis not present

## 2012-09-02 DIAGNOSIS — R109 Unspecified abdominal pain: Secondary | ICD-10-CM | POA: Diagnosis not present

## 2012-09-02 DIAGNOSIS — D7389 Other diseases of spleen: Secondary | ICD-10-CM | POA: Diagnosis not present

## 2012-09-02 DIAGNOSIS — E119 Type 2 diabetes mellitus without complications: Secondary | ICD-10-CM | POA: Diagnosis not present

## 2012-09-02 DIAGNOSIS — G8929 Other chronic pain: Secondary | ICD-10-CM | POA: Diagnosis not present

## 2012-09-02 DIAGNOSIS — M549 Dorsalgia, unspecified: Secondary | ICD-10-CM | POA: Diagnosis not present

## 2012-09-02 DIAGNOSIS — R1084 Generalized abdominal pain: Secondary | ICD-10-CM | POA: Diagnosis not present

## 2012-09-02 LAB — PROTIME-INR
INR: 0.99 (ref 0.00–1.49)
Prothrombin Time: 13.3 seconds (ref 11.6–15.2)

## 2012-09-02 LAB — PROTEIN S ACTIVITY: Protein S Activity: 108 % (ref 69–129)

## 2012-09-02 LAB — BASIC METABOLIC PANEL
Chloride: 103 mEq/L (ref 96–112)
GFR calc Af Amer: 81 mL/min — ABNORMAL LOW (ref >90)
GFR calc non Af Amer: 70 mL/min — ABNORMAL LOW (ref >90)
Potassium: 4 mEq/L (ref 3.5–5.1)
Sodium: 140 mEq/L (ref 135–145)

## 2012-09-02 LAB — GLUCOSE, CAPILLARY: Glucose-Capillary: 231 mg/dL — ABNORMAL HIGH (ref 70–99)

## 2012-09-02 LAB — CARDIOLIPIN ANTIBODIES, IGG, IGM, IGA: Anticardiolipin IgM: 3 MPL U/mL — ABNORMAL LOW (ref <11)

## 2012-09-02 LAB — BETA-2-GLYCOPROTEIN I ABS, IGG/M/A: Beta-2-Glycoprotein I IgA: 4 A Units (ref <20)

## 2012-09-02 MED ORDER — WARFARIN SODIUM 7.5 MG PO TABS
7.5000 mg | ORAL_TABLET | Freq: Once | ORAL | Status: AC
  Administered 2012-09-02: 7.5 mg via ORAL
  Filled 2012-09-02: qty 1

## 2012-09-02 MED ORDER — IOHEXOL 300 MG/ML  SOLN
100.0000 mL | Freq: Once | INTRAMUSCULAR | Status: AC | PRN
  Administered 2012-09-02: 100 mL via INTRAVENOUS

## 2012-09-02 MED ORDER — OXYCODONE HCL 5 MG PO TABS
5.0000 mg | ORAL_TABLET | Freq: Once | ORAL | Status: AC
  Administered 2012-09-02: 5 mg via ORAL
  Filled 2012-09-02: qty 1

## 2012-09-02 MED ORDER — HYDROMORPHONE HCL PF 1 MG/ML IJ SOLN
1.0000 mg | INTRAMUSCULAR | Status: DC | PRN
  Administered 2012-09-03 – 2012-09-08 (×24): 1 mg via INTRAVENOUS
  Filled 2012-09-02 (×26): qty 1

## 2012-09-02 MED ORDER — ENOXAPARIN (LOVENOX) PATIENT EDUCATION KIT
PACK | Freq: Once | Status: DC
  Filled 2012-09-02: qty 1

## 2012-09-02 NOTE — Progress Notes (Signed)
Subjective: Patient seen and examined, admitted for splenic infarct. Started on anticoagulation with lovenox. Hypercoagulable  Panel has been obtained, results are pending. Records from [facility] indicate that she has chronic low back pain, and that she has DJD. Repeat CT abdomen shows stable splenic infarct.   Objective: Vital signs in last 24 hours: Temp:  [98.1 F (36.7 C)-98.4 F (36.9 C)] 98.1 F (36.7 C) (09/13 1340) Pulse Rate:  [53-69] 53  (09/13 1340) Resp:  [18] 18  (09/13 1340) BP: (117-163)/(73-82) 117/73 mmHg (09/13 1340) SpO2:  [96 %-99 %] 98 % (09/13 1340) Weight change:  Last BM Date: 09/01/12  Intake/Output from previous day: 09/12 0701 - 09/13 0700 In: 1040 [P.O.:1040] Out: -      Physical Exam: Head: Normocephalic, atraumatic.  Eyes: No signs of jaundice, EOMI Nose: Mucous membranes dry.  Neck: supple,No deformities, masses, or tenderness noted. Lungs: Normal respiratory effort. B/L Clear to auscultation, no crackles or wheezes.  Heart: Regular RR. S1 and S2 normal  Abdomen: BS normoactive. Soft, Nondistended, non-tender.  Extremities: No pretibial edema, no erythema   Lab Results: Basic Metabolic Panel:  Basename 09/02/12 0550 08/31/12 1157  NA 140 138  K 4.0 3.3*  CL 103 101  CO2 28 28  GLUCOSE 173* 177*  BUN 9 5*  CREATININE 0.87 0.74  CALCIUM 9.2 9.5  MG -- --  PHOS -- --   Liver Function Tests:  Basename 08/31/12 1157  AST 13  ALT 30  ALKPHOS 174*  BILITOT 0.3  PROT 7.1  ALBUMIN 2.4*   No results found for this basename: LIPASE:2,AMYLASE:2 in the last 72 hours No results found for this basename: AMMONIA:2 in the last 72 hours CBC:  Basename 08/31/12 1157  WBC 12.4*  NEUTROABS 8.6*  HGB 14.4  HCT 42.9  MCV 83.5  PLT 360   CBG:  Basename 09/02/12 1658 09/02/12 1337 09/02/12 0735 09/01/12 2136 09/01/12 1731 09/01/12 1150  GLUCAP 144* 173* 169* 161* 171* 191*   Hemoglobin A1C:  Basename 08/31/12 2207  HGBA1C 10.4*    Coagulation:  Basename 09/02/12 0550 08/31/12 1753  LABPROT 13.3 13.8  INR 0.99 1.04   Urine Drug Screen: Drugs of Abuse     Component Value Date/Time   LABOPIA POSITIVE* 08/17/2012 1149   COCAINSCRNUR NONE DETECTED 08/17/2012 1149   LABBENZ NONE DETECTED 08/17/2012 1149   AMPHETMU NONE DETECTED 08/17/2012 1149   THCU NONE DETECTED 08/17/2012 1149   LABBARB NONE DETECTED 08/17/2012 1149    Alcohol Level: No results found for this basename: ETH:2 in the last 72 hours Urinalysis:  Basename 08/31/12 1213  COLORURINE YELLOW  LABSPEC 1.016  PHURINE 7.0  GLUCOSEU 250*  HGBUR MODERATE*  BILIRUBINUR NEGATIVE  KETONESUR NEGATIVE  PROTEINUR >300*  UROBILINOGEN 0.2  NITRITE NEGATIVE  LEUKOCYTESUR NEGATIVE    No results found for this or any previous visit (from the past 240 hour(s)).  Studies/Results: Ct Abdomen Pelvis W Contrast  09/02/2012  *RADIOLOGY REPORT*  Clinical Data: Diffuse abdominal pain and tenderness.  Recent splenic infarct.  CT ABDOMEN AND PELVIS WITH CONTRAST  Technique:  Multidetector CT imaging of the abdomen and pelvis was performed following the standard protocol during bolus administration of intravenous contrast.  Contrast: OMNIPAQUE IOHEXOL 300 MG/ML  SOLN  Comparison: 08/31/2012  Findings: Acute splenic infarcts are stable in appearance.  There is no evidence of splenic rupture or hemoperitoneum.  Spleen remains normal in size.  Splenic vein remains patent.  Several tiny sub-centimeter cyst noted in the  kidneys, however there is no evidence of renal masses or infarcts. The other abdominal parenchymal organs are normal in appearance.  Gallbladder is absent.  No evidence of biliary ductal dilatation.  No soft tissue masses or lymphadenopathy identified within the abdomen or pelvis.  Mild sigmoid diverticulosis is seen, without evidence of diverticulitis.  Prior hysterectomy noted.  Adnexa are unremarkable.  Normal appendix is visualized.  No evidence of  inflammatory process or abnormal fluid collections within the abdomen or pelvis.  No evidence of bowel wall thickening or dilatation.  No hernia identified.  IMPRESSION:  1.  Stable appearance of acute to subacute splenic infarcts. 2.  No evidence of splenic rupture, hemoperitoneum, or other complication.  No acute interval findings.   Original Report Authenticated By: Danae Orleans, M.D.     Medications: Scheduled Meds:    . amLODipine  10 mg Oral Daily  . enoxaparin (LOVENOX) injection  110 mg Subcutaneous Q12H  . enoxaparin   Does not apply Once  . hydrochlorothiazide  25 mg Oral Daily  . insulin aspart  0-15 Units Subcutaneous TID WC  . insulin glargine  15 Units Subcutaneous QHS  . losartan  100 mg Oral Daily  . metoCLOPramide  5 mg Oral TID AC & HS  . metoprolol  50 mg Oral BID  . pantoprazole  40 mg Oral Q1200  . sodium chloride  3 mL Intravenous Q12H  . warfarin  7.5 mg Oral ONCE-1800  . Warfarin - Pharmacist Dosing Inpatient   Does not apply q1800   Continuous Infusions:    . 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 Has been started on anticoagulation with lovenox and coumadin Work up is in progress for hypercoagulable state. Plan was discussed with Haem/onc on call Dr Darrold Span.  Left flank pain Pain is better with opioids. CT Abdomen pelvis is negative for pyelonephritis Patient has DJD as per records from [facility] Will obtain xray Lumbosacral spine  Diabetes Mellitus Continue sliding scale insulin   LAMA,GAGAN S [facility] Pager: [phone] 09/02/2012, 7:49 PM

## 2012-09-02 NOTE — Care Management Note (Addendum)
    Page 1 of 2   09/09/2012     2:33:55 PM   CARE MANAGEMENT NOTE 09/09/2012  Patient:  Joy Butler, Joy Butler   Account Number:  [id number]  Date Initiated:  09/02/2012  Documentation initiated by:  Letha Cape  Subjective/Objective Assessment:   dx splenic infarction  admit- lives with friends, she is from [place].     Action/Plan:   lovenox- pt therapeutic now does not need lovenox, but will need help with other meds.   Anticipated DC Date:  09/09/2012   Anticipated DC Plan:  HOME/SELF CARE  In-house referral  Clinical Social Worker      DC Planning Services  CM consult  Medication Assistance  Follow-up appt scheduled      Choice offered to / List presented to:             Status of service:  Completed, signed off Medicare Important Message given?   (If response is "NO", the following Medicare IM given date fields will be blank) Date Medicare IM given:   Date Additional Medicare IM given:    Discharge Disposition:  HOME/SELF CARE  Per UR Regulation:  Reviewed for med. necessity/level of care/duration of stay  If discussed at Long Length of Stay Meetings, dates discussed:   09/06/2012  09/08/2012    Comments:  09/09/12 10:09 Letha Cape RN, BSN [phone] patient for dc today, will not need lovenox, patient on coumadin, patient has pt/inr apt scheduled for 9/24  at 2pm with Dr. Mikeal Hawthorne and she will have a regular appt as well. Patient states she will need med ast, with other meds other than lovenox, tubed scripts to pharmacy, they will call [phone] when ready.  Patient need a shower chair, referral given to [facility] to [place].  Also will fax outpt pt referral to neuro rehabilitation.  09/06/12 14:50 Letha Cape RN, BSN [phone] Patient on lovenox and coumadin, inr 1.92 today, thoracic MRI showed abnormal right paraspinal/intercostal process at T8 level.  Patient also with splenic infarct, MD considering whether this may be a thrombophlebitis of the intercostal vessels.  If bld  cx is positive will need a tee done.  09/02/12 11:23 Letha Cape RN, BSN [phone] patient is from [place], she is now living with a friend, she states she has Medicare A and B and [place] [employer] [place] [id number].  Patient states she lives in a house and the lanlord has not kept the house up paid the light bill, she states her water bill was $500-$600 and her light bill right now is $1600 she would like to speak with a CSW to see if she can get some help with her light bill, CSW made aware.  Patient will be on lovenox at dc of 110mg  bid for 10 days, waiting for benefits check information on lovenox to how much BCBS will cover.  Patient does not have andy medication coverage at all , she just has medical coverage. Patient is eligible for med ast, tubed patient ast form and script to pharmacy for lovenox 110mg  bid for 10 days. Patient is for dc probable Saturday.  Pharmacy will call [phone] when lovenox is ready for RN to pick up.

## 2012-09-02 NOTE — Progress Notes (Signed)
ANTICOAGULATION CONSULT NOTE - follow up  Pharmacy Consult for Lovenox and coumadin Indication: splenic infarct  Allergies  Allergen Reactions  . Keflex (Cephalexin) Itching and Swelling    Lips and eyes swelled up  . Latex Itching  . Sulfa Antibiotics Rash  . Sulfur Rash    Patient Measurements: Height: 5' 8.9" (175 cm) Weight: 239 lb 3.2 oz (108.5 kg) IBW/kg (Calculated) : 65.97    Vital Signs: Temp: 98.4 F (36.9 C) (09/13 0422) Temp src: Oral (09/13 0422) BP: 123/73 mmHg (09/13 0917) Pulse Rate: 65  (09/13 0917)  Labs:  Basename 09/02/12 0550 08/31/12 1753 08/31/12 1157  HGB -- -- 14.4  HCT -- -- 42.9  PLT -- -- 360  APTT -- 29 --  LABPROT 13.3 13.8 --  INR 0.99 1.04 --  HEPARINUNFRC -- -- --  CREATININE 0.87 -- 0.74  CKTOTAL -- -- --  CKMB -- -- --  TROPONINI -- -- --    Estimated Creatinine Clearance: 87.8 ml/min (by C-G formula based on Cr of 0.87).   Assessment: 61 y/o AA F admitted with abdominal pain, found to have splenic infarct requiring anticoagulation. H/o PE but has been off coumadin since April of 2012.  Stopped coumadin due to financial concerns.   Prior to administration of anticoagulant a hypercoagulable panel was drawn, which is pending.   INR is below goal today, expected s/p first dose of Coumadin last PM. Noted plans to send home today with LMWH/Coumadin bridge. Recommended 7-10 days of overlap (5 days minimal, 2 days after INR>2). CBC and plts ok 9/11.  Goal of Therapy:  Anti-Xa level 0.6-1.2 units/ml 4hrs after LMWH dose given Monitor platelets by anticoagulation protocol: Yes INR 2-3   Plan:  1. Continue Lovenox 110mg  sq q12h 2. Coumadin 7.5 mg po x 1 dose today- may give before d/c home 3. Will continue to f/up daily if patient remains here  Thanks, Lilianah Buffin K. Allena Katz, PharmD, BCPS.  Clinical Pharmacist Pager [phone]. 09/02/2012 10:49 AM

## 2012-09-02 NOTE — Progress Notes (Signed)
CSW called and spoke to pt re: utility needs.  Pt explained to CSW that she recently moved here from [place].  Pt stated that in [place] she was "homeless" but stayed in hotels.  Pt stated she has a monthly income and has recently found her a place to stay here in [place].  Pt's landlord had not paid his electric bill and the pt was being charged $1,600 in order to get lights turned on there.  Pt stated that she could ask a friend to get the lights turned on in their name and the deposit would go down to between $200-$300.  CSW explained to pt that [facility] did not have resources available to assist with such needs.  CSW was able to provide pt with numbers to [facility] [phone]; [facility] DSS- Emergency Assistance [phone]; and [facility] [phone].  No further CSW assistance is necessary. Vickii Penna, LCSWA [phone]  Clinical Social Work

## 2012-09-03 DIAGNOSIS — E119 Type 2 diabetes mellitus without complications: Secondary | ICD-10-CM | POA: Diagnosis not present

## 2012-09-03 DIAGNOSIS — R109 Unspecified abdominal pain: Secondary | ICD-10-CM | POA: Diagnosis not present

## 2012-09-03 DIAGNOSIS — M549 Dorsalgia, unspecified: Secondary | ICD-10-CM | POA: Diagnosis not present

## 2012-09-03 DIAGNOSIS — G8929 Other chronic pain: Secondary | ICD-10-CM | POA: Diagnosis not present

## 2012-09-03 LAB — CBC
MCH: 27.5 pg (ref 26.0–34.0)
MCHC: 33.3 g/dL (ref 30.0–36.0)
MCV: 82.6 fL (ref 78.0–100.0)
Platelets: 345 10*3/uL (ref 150–400)
RBC: 4.61 MIL/uL (ref 3.87–5.11)
RDW: 15.3 % (ref 11.5–15.5)

## 2012-09-03 LAB — GLUCOSE, CAPILLARY: Glucose-Capillary: 162 mg/dL — ABNORMAL HIGH (ref 70–99)

## 2012-09-03 MED ORDER — WARFARIN SODIUM 7.5 MG PO TABS
7.5000 mg | ORAL_TABLET | Freq: Once | ORAL | Status: AC
  Administered 2012-09-03: 7.5 mg via ORAL
  Filled 2012-09-03: qty 1

## 2012-09-03 NOTE — Progress Notes (Signed)
Subjective: Patient seen and examined, admitted for splenic infarct. Started on anticoagulation with lovenox. Hypercoagulable  Panel has been obtained, results are pending. Records from [facility] indicate that she has chronic low back pain, and that she has DJD. Repeat CT abdomen shows stable splenic infarct. Patient continues to have right sided back pain. 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 Severe facet degenerative changes at L5-S1.    Objective: Vital signs in last 24 hours: Temp:  [97.7 F (36.5 C)-98.7 F (37.1 C)] 97.7 F (36.5 C) (09/14 1300) Pulse Rate:  [67-70] 67  (09/14 1300) Resp:  [18] 18  (09/14 1300) BP: (136-160)/(77-79) 136/77 mmHg (09/14 1300) SpO2:  [95 %-99 %] 99 % (09/14 1300) Weight change:  Last BM Date: 09/01/12  Intake/Output from previous day: 09/13 0701 - 09/14 0700 In: 1687.7 [P.O.:956; I.V.:731.7] Out: -  Total I/O In: 320 [P.O.:320] Out: -    Physical Exam: Head: Normocephalic, atraumatic.  Eyes: No signs of jaundice, EOMI Nose: Mucous membranes dry.  Neck: supple,No deformities, masses, or tenderness noted. Lungs: Normal respiratory effort. B/L Clear to auscultation, no crackles or wheezes.  Heart: Regular RR. S1 and S2 normal  Abdomen: BS normoactive. Soft, Nondistended, non-tender.  Extremities: No pretibial edema, no erythema   Lab Results: Basic Metabolic Panel:  Basename 09/02/12 0550  NA 140  K 4.0  CL 103  CO2 28  GLUCOSE 173*  BUN 9  CREATININE 0.87  CALCIUM 9.2  MG --  PHOS --   Liver Function Tests: No results found for this basename: AST:2,ALT:2,ALKPHOS:2,BILITOT:2,PROT:2,ALBUMIN:2 in the last 72 hours No results found for this basename: LIPASE:2,AMYLASE:2 in the last 72 hours No results found for this basename: AMMONIA:2 in the last 72 hours CBC:  Basename 09/03/12 0710  WBC 9.6  NEUTROABS --  HGB 12.7  HCT 38.1  MCV 82.6  PLT 345    CBG:  Basename 09/03/12 1153 09/03/12 0740 09/02/12 2117 09/02/12 1658 09/02/12 1337 09/02/12 0735  GLUCAP 190* 170* 231* 144* 173* 169*   Hemoglobin A1C:  Basename 08/31/12 2207  HGBA1C 10.4*   Coagulation:  Basename 09/03/12 0710 09/02/12 0550  LABPROT 14.4 13.3  INR 1.10 0.99   Urine Drug Screen: Drugs of Abuse     Component Value Date/Time   LABOPIA POSITIVE* 08/17/2012 1149   COCAINSCRNUR NONE DETECTED 08/17/2012 1149   LABBENZ NONE DETECTED 08/17/2012 1149   AMPHETMU NONE DETECTED 08/17/2012 1149   THCU NONE DETECTED 08/17/2012 1149   LABBARB NONE DETECTED 08/17/2012 1149    Alcohol Level: No results found for this basename: ETH:2 in the last 72 hours Urinalysis: No results found for this basename: COLORURINE:2,APPERANCEUR:2,LABSPEC:2,PHURINE:2,GLUCOSEU:2,HGBUR:2,BILIRUBINUR:2,KETONESUR:2,PROTEINUR:2,UROBILINOGEN:2,NITRITE:2,LEUKOCYTESUR:2 in the last 72 hours  No results found for this or any previous visit (from the past 240 hour(s)).  Studies/Results: Dg Lumbar Spine Complete  09/02/2012  *RADIOLOGY REPORT*  Clinical Data: Right-sided low back pain is temporally related to a fall in March, 2013.  Pain recently worsened.  LUMBAR SPINE - COMPLETE 4+ VIEW  Comparison: Bone window images from CT abdomen and pelvis earlier same date, 08/31/2012, 08/17/2012.  Findings: Oral contrast material from the recent CT is present throughout the colon, obscuring fine bony detail.  Five non-rib bearing lumbar vertebrae with anatomic alignment.  No fractures. Disc space narrowing and endplate hypertrophic changes at L5-S1 (severe) and L4-5 (moderate).  Severe facet degenerative changes at L5-S1, right greater than left.  No pars defects.  Sacroiliac joints intact.  Lower  thoracic spondylosis.  Aorto-iliac atherosclerosis.  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  Severe facet degenerative changes at L5-S1.   Original Report Authenticated  By: Arnell Sieving, M.D.    Ct Abdomen Pelvis W Contrast  09/02/2012  *RADIOLOGY REPORT*  Clinical Data: Diffuse abdominal pain and tenderness.  Recent splenic infarct.  CT ABDOMEN AND PELVIS WITH CONTRAST  Technique:  Multidetector CT imaging of the abdomen and pelvis was performed following the standard protocol during bolus administration of intravenous contrast.  Contrast: OMNIPAQUE IOHEXOL 300 MG/ML  SOLN  Comparison: 08/31/2012  Findings: Acute splenic infarcts are stable in appearance.  There is no evidence of splenic rupture or hemoperitoneum.  Spleen remains normal in size.  Splenic vein remains patent.  Several tiny sub-centimeter cyst noted in the kidneys, however there is no evidence of renal masses or infarcts. The other abdominal parenchymal organs are normal in appearance.  Gallbladder is absent.  No evidence of biliary ductal dilatation.  No soft tissue masses or lymphadenopathy identified within the abdomen or pelvis.  Mild sigmoid diverticulosis is seen, without evidence of diverticulitis.  Prior hysterectomy noted.  Adnexa are unremarkable.  Normal appendix is visualized.  No evidence of inflammatory process or abnormal fluid collections within the abdomen or pelvis.  No evidence of bowel wall thickening or dilatation.  No hernia identified.  IMPRESSION:  1.  Stable appearance of acute to subacute splenic infarcts. 2.  No evidence of splenic rupture, hemoperitoneum, or other complication.  No acute interval findings.   Original Report Authenticated By: Danae Orleans, M.D.     Medications: Scheduled Meds:    . amLODipine  10 mg Oral Daily  . enoxaparin (LOVENOX) injection  110 mg Subcutaneous Q12H  . enoxaparin   Does not apply Once  . hydrochlorothiazide  25 mg Oral Daily  . insulin aspart  0-15 Units Subcutaneous TID WC  . insulin glargine  15 Units Subcutaneous QHS  . losartan  100 mg Oral Daily  . metoCLOPramide  5 mg Oral TID AC & HS  . metoprolol  50 mg Oral BID   . oxyCODONE  5 mg Oral Once  . pantoprazole  40 mg Oral Q1200  . sodium chloride  3 mL Intravenous Q12H  . warfarin  7.5 mg Oral ONCE-1800  . Warfarin - Pharmacist Dosing Inpatient   Does not apply q1800   Continuous Infusions:    . sodium chloride 10 mL/hr at 09/03/12 0621   PRN Meds:.HYDROmorphone (DILAUDID) injection, DISCONTD: oxyCODONE  Assessment/Plan:  Active Problems:  HTN (hypertension)  Diabetes mellitus  Personal history of PE (pulmonary embolism)  Chronic back pain  Splenic infarction  Splenic infarction Patient developed acute splenic infarct. Has been started on anticoagulation with lovenox and coumadin Work up is in progress for hypercoagulable state. Plan was discussed with Haem/onc on call Dr Darrold Span.  Left flank pain Pain is better with opioids. CT Abdomen pelvis is negative for pyelonephritis Patient has DJD as per records from [facility] Will obtain MRI of the lumbosacral spine  Diabetes Mellitus Continue sliding scale insulin   Seena Ritacco S [facility] Pager: [phone] 09/03/2012, 2:52 PM

## 2012-09-03 NOTE — Progress Notes (Signed)
ANTICOAGULATION CONSULT NOTE - follow up  Pharmacy Consult for Lovenox and coumadin Indication: splenic infarct  Allergies  Allergen Reactions  . Keflex (Cephalexin) Itching and Swelling    Lips and eyes swelled up  . Latex Itching  . Sulfa Antibiotics Rash  . Sulfur Rash    Patient Measurements: Height: 5' 8.9" (175 cm) Weight: 239 lb 3.2 oz (108.5 kg) IBW/kg (Calculated) : 65.97    Vital Signs: Temp: 97.7 F (36.5 C) (09/14 1300) Temp src: Oral (09/14 1300) BP: 136/77 mmHg (09/14 1300) Pulse Rate: 67  (09/14 1300)  Labs:  Basename 09/03/12 0710 09/02/12 0550 08/31/12 1753  HGB 12.7 -- --  HCT 38.1 -- --  PLT 345 -- --  APTT -- -- 29  LABPROT 14.4 13.3 13.8  INR 1.10 0.99 1.04  HEPARINUNFRC -- -- --  CREATININE -- 0.87 --  CKTOTAL -- -- --  CKMB -- -- --  TROPONINI -- -- --    Estimated Creatinine Clearance: 87.8 ml/min (by C-G formula based on Cr of 0.87).   Assessment: 61 y/o AA F admitted with abdominal pain, found to have splenic infarct requiring anticoagulation. H/o PE but has not taken warfarin since April 2012.  Stopped due to financial concerns.   Prior to administration of anticoagulant a hypercoagulable panel was drawn, which is still pending.   INR is still below goal today after 2 warfarin doses of 7.5mg . Questionable plans to send home today with LMWH/Coumadin bridge. Recommended 7-10 days of overlap (5 days minimal, 2 days after INR>2). CBC and plts all normal today  Goal of Therapy:  Anti-Xa level 0.6-1.2 units/ml 4hrs after LMWH dose given Monitor platelets by anticoagulation protocol: Yes INR 2-3   Plan:  1. Continue Lovenox 110mg  sq q12h 2. Coumadin 7.5 mg po x 1 dose today- may give before d/c home 3. Will continue to f/up daily if patient remains here  Hubbard Seldon D. Taher Vannote, PharmD Clinical Pharmacist Pager: [phone] Phone: [phone] 09/03/2012 3:02 PM

## 2012-09-04 ENCOUNTER — Inpatient Hospital Stay (HOSPITAL_COMMUNITY): Payer: MEDICARE

## 2012-09-04 DIAGNOSIS — J984 Other disorders of lung: Secondary | ICD-10-CM | POA: Diagnosis not present

## 2012-09-04 DIAGNOSIS — G8929 Other chronic pain: Secondary | ICD-10-CM | POA: Diagnosis not present

## 2012-09-04 DIAGNOSIS — M169 Osteoarthritis of hip, unspecified: Secondary | ICD-10-CM | POA: Diagnosis not present

## 2012-09-04 DIAGNOSIS — M5137 Other intervertebral disc degeneration, lumbosacral region: Secondary | ICD-10-CM | POA: Diagnosis not present

## 2012-09-04 DIAGNOSIS — M545 Low back pain: Secondary | ICD-10-CM | POA: Diagnosis not present

## 2012-09-04 DIAGNOSIS — M549 Dorsalgia, unspecified: Secondary | ICD-10-CM | POA: Diagnosis not present

## 2012-09-04 DIAGNOSIS — M47817 Spondylosis without myelopathy or radiculopathy, lumbosacral region: Secondary | ICD-10-CM | POA: Diagnosis not present

## 2012-09-04 DIAGNOSIS — E119 Type 2 diabetes mellitus without complications: Secondary | ICD-10-CM | POA: Diagnosis not present

## 2012-09-04 DIAGNOSIS — R079 Chest pain, unspecified: Secondary | ICD-10-CM | POA: Diagnosis not present

## 2012-09-04 DIAGNOSIS — I517 Cardiomegaly: Secondary | ICD-10-CM | POA: Diagnosis not present

## 2012-09-04 DIAGNOSIS — R109 Unspecified abdominal pain: Secondary | ICD-10-CM | POA: Diagnosis not present

## 2012-09-04 DIAGNOSIS — M5126 Other intervertebral disc displacement, lumbar region: Secondary | ICD-10-CM | POA: Diagnosis not present

## 2012-09-04 LAB — BASIC METABOLIC PANEL
BUN: 15 mg/dL (ref 6–23)
CO2: 29 mEq/L (ref 19–32)
Calcium: 9.6 mg/dL (ref 8.4–10.5)
Chloride: 98 mEq/L (ref 96–112)
Creatinine, Ser: 0.93 mg/dL (ref 0.50–1.10)
GFR calc Af Amer: 75 mL/min — ABNORMAL LOW (ref >90)

## 2012-09-04 LAB — GLUCOSE, CAPILLARY: Glucose-Capillary: 135 mg/dL — ABNORMAL HIGH (ref 70–99)

## 2012-09-04 LAB — PROTIME-INR
INR: 1.38 (ref 0.00–1.49)
Prothrombin Time: 17.2 seconds — ABNORMAL HIGH (ref 11.6–15.2)

## 2012-09-04 MED ORDER — METHOCARBAMOL 500 MG PO TABS
500.0000 mg | ORAL_TABLET | Freq: Three times a day (TID) | ORAL | Status: DC
  Administered 2012-09-04 – 2012-09-09 (×15): 500 mg via ORAL
  Filled 2012-09-04 (×17): qty 1

## 2012-09-04 MED ORDER — WARFARIN SODIUM 7.5 MG PO TABS
7.5000 mg | ORAL_TABLET | Freq: Once | ORAL | Status: AC
  Filled 2012-09-04: qty 1

## 2012-09-04 MED ORDER — LORAZEPAM 2 MG/ML IJ SOLN
2.0000 mg | Freq: Once | INTRAMUSCULAR | Status: AC
  Administered 2012-09-04: 2 mg via INTRAVENOUS
  Filled 2012-09-04: qty 1

## 2012-09-04 NOTE — Progress Notes (Signed)
ANTICOAGULATION CONSULT NOTE - follow up  Pharmacy Consult for Lovenox and Warfarin Indication: splenic infarct  Allergies  Allergen Reactions  . Keflex (Cephalexin) Itching and Swelling    Lips and eyes swelled up  . Latex Itching  . Sulfa Antibiotics Rash  . Sulfur Rash    Patient Measurements: Height: 5' 8.9" (175 cm) Weight: 239 lb 3.2 oz (108.5 kg) IBW/kg (Calculated) : 65.97    Vital Signs: Temp: 98.4 Butler (36.9 C) (09/15 0509) Temp src: Oral (09/15 0509) BP: 132/73 mmHg (09/15 0509) Pulse Rate: 58  (09/15 0509)  Labs:  Basename 09/04/12 0603 09/03/12 0710 09/02/12 0550  HGB -- 12.7 --  HCT -- 38.1 --  PLT -- 345 --  APTT -- -- --  LABPROT 17.2* 14.4 13.3  INR 1.38 1.10 0.99  HEPARINUNFRC -- -- --  CREATININE -- -- 0.87  CKTOTAL -- -- --  CKMB -- -- --  TROPONINI -- -- --    Estimated Creatinine Clearance: 87.8 ml/min (by C-G formula based on Cr of 0.87).   Assessment: 61 y/o AA Butler admitted with abdominal pain, found to have splenic infarct requiring anticoagulation. H/o PE but has not taken warfarin since April 2012.  Stopped due to financial concerns.    INR 1.38 today (from 1.1<0.99) Questionable plans to send home with LMWH/Coumadin bridge. Recommended 7-10 days of overlap (5 days minimal, 2 days after INR>2). CBC WNL, no evidence of bleeding reported  Goal of Therapy:  INR 2-3 Monitor platelets by anticoagulation protocol: Yes   Plan:  -Continue Lovenox 110mg  sq q12h -Coumadin 7.5 mg po x 1 at 1800 -Daily PT/INR -CBC q72h minimum while on lovenox -Monitor clinical progression  Abran Duke, PharmD Clinical Pharmacist Phone: [phone] Pager: [phone] 09/04/2012 9:48 AM

## 2012-09-04 NOTE — Progress Notes (Signed)
Pt attempted MRI @ 0815hrs Sunday am without success. Pt is extremely claustrophobic. Pt wishes to try again with aide of antianxiety meds. Called Peggy Rn ; she was unavailable to take call. Spoke with unit secretary  Left msg for RN to see if pt could have pre meds prior to exam. Please call MRI prior to sedating pt. [phone]

## 2012-09-04 NOTE — Progress Notes (Signed)
Subjective: Patient seen and examined, admitted for splenic infarct. Started on anticoagulation with lovenox. Hypercoagulable  Panel has been obtained, results are pending. Records from [facility] indicate that she has chronic low back pain, and that she has DJD. Repeat CT abdomen shows stable splenic infarct. Patient continues to have right sided back pain. 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 Severe facet degenerative changes at L5-S1.  Patient says she is getting spasms on left side, and having difficulty bending towards one side. She also repotrs falling on this side few months back.  Objective: Vital signs in last 24 hours: Temp:  [98.2 F (36.8 C)-98.4 F (36.9 C)] 98.4 F (36.9 C) (09/15 0509) Pulse Rate:  [58-70] 58  (09/15 0509) Resp:  [18] 18  (09/15 0509) BP: (117-132)/(73-79) 132/73 mmHg (09/15 0509) SpO2:  [93 %-98 %] 98 % (09/15 0509) Weight change:  Last BM Date: 09/01/12  Intake/Output from previous day: 09/14 0701 - 09/15 0700 In: 754.2 [P.O.:560; I.V.:194.2] Out: 900 [Urine:900] Total I/O In: 240 [P.O.:240] Out: -    Physical Exam: Head: Normocephalic, atraumatic.  Eyes: No signs of jaundice, EOMI Nose: Mucous membranes dry.  Neck: supple,No deformities, masses, or tenderness noted. Lungs: Normal respiratory effort. B/L Clear to auscultation, no crackles or wheezes.  Heart: Regular RR. S1 and S2 normal  Abdomen: BS normoactive. Soft, Nondistended, non-tender. Positive right flank tenderness Extremities: No pretibial edema, no erythema   Lab Results: Basic Metabolic Panel:  Basename 09/04/12 1106 09/02/12 0550  NA 136 140  K 3.9 4.0  CL 98 103  CO2 29 28  GLUCOSE 201* 173*  BUN 15 9  CREATININE 0.93 0.87  CALCIUM 9.6 9.2  MG 2.1 --  PHOS -- --   CBC:  Basename 09/03/12 0710  WBC 9.6  NEUTROABS --  HGB 12.7  HCT 38.1  MCV 82.6  PLT 345   CBG:  Basename 09/04/12 1217 09/04/12  0747 09/03/12 2147 09/03/12 1700 09/03/12 1153 09/03/12 0740  GLUCAP 184* 171* 165* 162* 190* 170*   Hemoglobin A1C: No results found for this basename: HGBA1C in the last 72 hours Coagulation:  Basename 09/04/12 0603 09/03/12 0710  LABPROT 17.2* 14.4  INR 1.38 1.10   Urine Drug Screen: Drugs of Abuse     Component Value Date/Time   LABOPIA POSITIVE* 08/17/2012 1149   COCAINSCRNUR NONE DETECTED 08/17/2012 1149   LABBENZ NONE DETECTED 08/17/2012 1149   AMPHETMU NONE DETECTED 08/17/2012 1149   THCU NONE DETECTED 08/17/2012 1149   LABBARB NONE DETECTED 08/17/2012 1149    Alcohol Level: No results found for this basename: ETH:2 in the last 72 hours Urinalysis: No results found for this basename: COLORURINE:2,APPERANCEUR:2,LABSPEC:2,PHURINE:2,GLUCOSEU:2,HGBUR:2,BILIRUBINUR:2,KETONESUR:2,PROTEINUR:2,UROBILINOGEN:2,NITRITE:2,LEUKOCYTESUR:2 in the last 72 hours  No results found for this or any previous visit (from the past 240 hour(s)).  Studies/Results: Dg Lumbar Spine Complete  09/02/2012  *RADIOLOGY REPORT*  Clinical Data: Right-sided low back pain is temporally related to a fall in March, 2013.  Pain recently worsened.  LUMBAR SPINE - COMPLETE 4+ VIEW  Comparison: Bone window images from CT abdomen and pelvis earlier same date, 08/31/2012, 08/17/2012.  Findings: Oral contrast material from the recent CT is present throughout the colon, obscuring fine bony detail.  Five non-rib bearing lumbar vertebrae with anatomic alignment.  No fractures. Disc space narrowing and endplate hypertrophic changes at L5-S1 (severe) and L4-5 (moderate).  Severe facet degenerative changes at L5-S1, right greater than left.  No pars defects.  Sacroiliac  joints intact.  Lower thoracic spondylosis.  Aorto-iliac atherosclerosis.  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  Severe facet degenerative changes at L5-S1.   Original Report Authenticated By: Arnell Sieving, M.D.    Ct Abdomen Pelvis W Contrast  09/02/2012  *RADIOLOGY REPORT*  Clinical Data: Diffuse abdominal pain and tenderness.  Recent splenic infarct.  CT ABDOMEN AND PELVIS WITH CONTRAST  Technique:  Multidetector CT imaging of the abdomen and pelvis was performed following the standard protocol during bolus administration of intravenous contrast.  Contrast: OMNIPAQUE IOHEXOL 300 MG/ML  SOLN  Comparison: 08/31/2012  Findings: Acute splenic infarcts are stable in appearance.  There is no evidence of splenic rupture or hemoperitoneum.  Spleen remains normal in size.  Splenic vein remains patent.  Several tiny sub-centimeter cyst noted in the kidneys, however there is no evidence of renal masses or infarcts. The other abdominal parenchymal organs are normal in appearance.  Gallbladder is absent.  No evidence of biliary ductal dilatation.  No soft tissue masses or lymphadenopathy identified within the abdomen or pelvis.  Mild sigmoid diverticulosis is seen, without evidence of diverticulitis.  Prior hysterectomy noted.  Adnexa are unremarkable.  Normal appendix is visualized.  No evidence of inflammatory process or abnormal fluid collections within the abdomen or pelvis.  No evidence of bowel wall thickening or dilatation.  No hernia identified.  IMPRESSION:  1.  Stable appearance of acute to subacute splenic infarcts. 2.  No evidence of splenic rupture, hemoperitoneum, or other complication.  No acute interval findings.   Original Report Authenticated By: Danae Orleans, M.D.     Medications: Scheduled Meds:    . amLODipine  10 mg Oral Daily  . enoxaparin (LOVENOX) injection  110 mg Subcutaneous Q12H  . enoxaparin   Does not apply Once  . hydrochlorothiazide  25 mg Oral Daily  . insulin aspart  0-15 Units Subcutaneous TID WC  . insulin glargine  15 Units Subcutaneous QHS  . LORazepam  2 mg Intravenous Once  . losartan  100 mg Oral Daily  . methocarbamol  500 mg Oral TID  .  metoCLOPramide  5 mg Oral TID AC & HS  . metoprolol  50 mg Oral BID  . pantoprazole  40 mg Oral Q1200  . sodium chloride  3 mL Intravenous Q12H  . warfarin  7.5 mg Oral ONCE-1800  . warfarin  7.5 mg Oral ONCE-1800  . Warfarin - Pharmacist Dosing Inpatient   Does not apply q1800   Continuous Infusions:    . 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 Has been started on anticoagulation with lovenox and coumadin INR is 1.38 Work up is in progress for hypercoagulable state. Plan was discussed with Haem/onc on call Dr Darrold Span.  Left flank pain Pain is better with opioids. CT Abdomen pelvis is negative for pyelonephritis Patient has DJD as per records from [facility] MRI of the lumbosacral spine pending Will also obtain xray ribs. Start Robaxin 500 mg po tid  Diabetes Mellitus Continue sliding scale insulin   Murrell Dome S [facility] Pager: [phone] 09/04/2012, 1:52 PM

## 2012-09-04 NOTE — Progress Notes (Signed)
Pt had a 4 beat run of V tach & is asymptomatic.

## 2012-09-05 ENCOUNTER — Inpatient Hospital Stay (HOSPITAL_COMMUNITY): Payer: MEDICARE

## 2012-09-05 DIAGNOSIS — M549 Dorsalgia, unspecified: Secondary | ICD-10-CM | POA: Diagnosis not present

## 2012-09-05 DIAGNOSIS — R109 Unspecified abdominal pain: Secondary | ICD-10-CM | POA: Diagnosis not present

## 2012-09-05 DIAGNOSIS — G8929 Other chronic pain: Secondary | ICD-10-CM | POA: Diagnosis not present

## 2012-09-05 DIAGNOSIS — E119 Type 2 diabetes mellitus without complications: Secondary | ICD-10-CM | POA: Diagnosis not present

## 2012-09-05 DIAGNOSIS — D7389 Other diseases of spleen: Secondary | ICD-10-CM | POA: Diagnosis not present

## 2012-09-05 DIAGNOSIS — M47814 Spondylosis without myelopathy or radiculopathy, thoracic region: Secondary | ICD-10-CM | POA: Diagnosis not present

## 2012-09-05 LAB — LUPUS ANTICOAGULANT PANEL
Lupus Anticoagulant: NOT DETECTED
PTTLA 4:1 Mix: 43.7 secs — ABNORMAL HIGH (ref 28.0–43.0)

## 2012-09-05 LAB — GLUCOSE, CAPILLARY
Glucose-Capillary: 165 mg/dL — ABNORMAL HIGH (ref 70–99)
Glucose-Capillary: 190 mg/dL — ABNORMAL HIGH (ref 70–99)
Glucose-Capillary: 138 mg/dL — ABNORMAL HIGH (ref 70–99)
Glucose-Capillary: 158 mg/dL — ABNORMAL HIGH (ref 70–99)

## 2012-09-05 LAB — PROTIME-INR
INR: 1.33 (ref 0.00–1.49)
Prothrombin Time: 16.7 seconds — ABNORMAL HIGH (ref 11.6–15.2)

## 2012-09-05 LAB — PROTEIN C, TOTAL: Protein C, Total: 124 % (ref 72–160)

## 2012-09-05 MED ORDER — LORAZEPAM 2 MG/ML IJ SOLN
2.0000 mg | Freq: Once | INTRAMUSCULAR | Status: AC
  Administered 2012-09-05: 2 mg via INTRAVENOUS
  Filled 2012-09-05: qty 1

## 2012-09-05 MED ORDER — WARFARIN SODIUM 10 MG PO TABS
10.0000 mg | ORAL_TABLET | Freq: Once | ORAL | Status: AC
  Administered 2012-09-05: 10 mg via ORAL
  Filled 2012-09-05: qty 1

## 2012-09-05 NOTE — Progress Notes (Signed)
Subjective: Patient seen and examined, admitted for splenic infarct. Started on anticoagulation with lovenox. Hypercoagulable  Panel has been obtained, results are pending. Records from [facility] indicate that she has chronic low back pain, and that she has DJD. Repeat CT abdomen shows stable splenic infarct. Patient continues to have right sided back pain. 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 Severe facet degenerative changes at L5-S1.  Patient says she is getting spasms on left side, and having difficulty bending towards one side. She also repotrs falling on this side few months back.  Objective: Vital signs in last 24 hours: Temp:  [98.2 F (36.8 C)-98.7 F (37.1 C)] 98.2 F (36.8 C) (09/16 0502) Pulse Rate:  [66-75] 75  (09/16 1003) Resp:  [16-18] 18  (09/16 0502) BP: (115-148)/(72-87) 115/74 mmHg (09/16 1003) SpO2:  [95 %-100 %] 98 % (09/16 0502) Weight change:  Last BM Date: 09/04/12  Intake/Output from previous day: 09/15 0701 - 09/16 0700 In: 640 [P.O.:640] Out: 527 [Urine:525; Stool:2] Total I/O In: 243 [P.O.:240; I.V.:3] Out: 200 [Urine:200]   Physical Exam: Head: Normocephalic, atraumatic.  Eyes: No signs of jaundice, EOMI Nose: Mucous membranes dry.  Neck: supple,No deformities, masses, or tenderness noted. Lungs: Normal respiratory effort. B/L Clear to auscultation, no crackles or wheezes.  Heart: Regular RR. S1 and S2 normal  Abdomen: BS normoactive. Soft, Nondistended, non-tender. Positive right flank tenderness Extremities: No pretibial edema, no erythema   Lab Results: Basic Metabolic Panel:  Basename 09/04/12 1106  NA 136  K 3.9  CL 98  CO2 29  GLUCOSE 201*  BUN 15  CREATININE 0.93  CALCIUM 9.6  MG 2.1  PHOS --   CBC:  Basename 09/03/12 0710  WBC 9.6  NEUTROABS --  HGB 12.7  HCT 38.1  MCV 82.6  PLT 345   CBG:  Basename 09/05/12 0755 09/04/12 2151 09/04/12 1735 09/04/12  1217 09/04/12 0747 09/03/12 2147  GLUCAP 165* 135* 161* 184* 171* 165*   Hemoglobin A1C: No results found for this basename: HGBA1C in the last 72 hours Coagulation:  Basename 09/05/12 0721 09/04/12 0603  LABPROT 16.7* 17.2*  INR 1.33 1.38   Urine Drug Screen: Drugs of Abuse     Component Value Date/Time   LABOPIA POSITIVE* 08/17/2012 1149   COCAINSCRNUR NONE DETECTED 08/17/2012 1149   LABBENZ NONE DETECTED 08/17/2012 1149   AMPHETMU NONE DETECTED 08/17/2012 1149   THCU NONE DETECTED 08/17/2012 1149   LABBARB NONE DETECTED 08/17/2012 1149    Alcohol Level: No results found for this basename: ETH:2 in the last 72 hours Urinalysis: No results found for this basename: COLORURINE:2,APPERANCEUR:2,LABSPEC:2,PHURINE:2,GLUCOSEU:2,HGBUR:2,BILIRUBINUR:2,KETONESUR:2,PROTEINUR:2,UROBILINOGEN:2,NITRITE:2,LEUKOCYTESUR:2 in the last 72 hours  No results found for this or any previous visit (from the past 240 hour(s)).  Studies/Results: Dg Ribs Unilateral W/chest Right  09/04/2012  *RADIOLOGY REPORT*  Clinical Data: Right lower rib pain after a fall.  RIGHT RIBS AND CHEST - 3+ VIEW  Comparison: Portable chest x-ray 08/16/2012.  Findings: No fractures identified involving the right ribs.  No intrinsic osseous abnormalities.  Cardiac silhouette mildly enlarged.  Linear scarring in the right mid lung, unchanged.  Lungs otherwise clear.  No pleural effusions. No pneumothorax.  Note made of degenerative changes in the right shoulder with calcified intra-articular loose bodies.  IMPRESSION:  1.  No right rib fractures identified. 2.  Mild cardiomegaly.  Scarring in the right mid lung.  No acute cardiopulmonary disease.   Original Report Authenticated By: Arnell Sieving,  M.D.    Mr Lumbar Spine Wo Contrast  09/04/2012  *RADIOLOGY REPORT*  Clinical Data: Acute low back pain.  The patient refused contrast imaging.  MRI LUMBAR SPINE WITHOUT CONTRAST  Technique:  Multiplanar and multiecho pulse sequences of the  lumbar spine were obtained without intravenous contrast.  Comparison: Radiography 09/13.  CT 09/13.  Findings: T 10/11:  There may be a right posterolateral disc herniation at that level.  This was not studied in detail.  T11-12 shows a shallow disc protrusion towards the left with some potential for foraminal compromise.  T12-L1 is normal.  L1-2:  Normal interspace.  L2-3:  Normal interspace.  L3-4:  Mild bulging of the disc.  Bilateral facet degeneration and hypertrophy.  Mild narrowing of the lateral recesses and neural foramina without definite neural compression.  L4-5:  Disc degeneration with a broad-based disc herniation. Pronounced facet arthropathy with marked hypertrophic change. Moderate stenosis of both lateral recesses and neural foramina. This appearance could worsen with standing or flexion.  L5-S1:  Shallow disc protrusion more prominent towards the right. Bilateral facet degeneration and hypertrophy right worse than left. Narrowing of the subarticular lateral recess and the intervertebral foramen on the right that could cause neural irritation.  IMPRESSION: The T10-11 level is included on the edge of the field but not studied in the axial plane.  There appears to be a right posterolateral disc herniation at this level.  This area was not studied in detail.  T11-12:  Similar caveat.  Shallow disc protrusion more pronounced towards the left .  L3-4:  Facet arthropathy.  Mild narrowing of the lateral recesses and neural foramina.  L4-5:  Broad-based disc herniation.  Pronounced facet arthropathy. Stenosis of the lateral recesses and foramina that could cause neural compression.  The appearance could worsen with standing or flexion.  L5-S1:  Shallow disc herniation more prominent towards the right. Facet degeneration hypertrophy right more than left.  Narrowing of the subarticular lateral recess and foramen on the right that could effect the right-sided nerve roots.   Original Report Authenticated By:  Thomasenia Sales, M.D.    Mr Sacrum/si Joints Wo Contrast  09/04/2012  *RADIOLOGY REPORT*  Clinical Data: Severe low back pain.  MR SACRUM WITHOUT CONTRAST  Technique: Scanning of the bony pelvis focusing on the sacroiliac joints.  Comparison:  Radiography 09/13.  CT 09/13.  Findings: Both sacroiliac joints show pronounced osteoarthritis with fluid in the joints and adjacent bone sclerosis and edema. These joints could certainly be a cause of pain.  No evidence of sacral fracture or other focal sacral lesion.  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   Original Report Authenticated By: Thomasenia Sales, M.D.     Medications: Scheduled Meds:    . amLODipine  10 mg Oral Daily  . enoxaparin (LOVENOX) injection  110 mg Subcutaneous Q12H  . enoxaparin   Does not apply Once  . hydrochlorothiazide  25 mg Oral Daily  . insulin aspart  0-15 Units Subcutaneous TID WC  . insulin glargine  15 Units Subcutaneous QHS  . LORazepam  2 mg Intravenous Once  . losartan  100 mg Oral Daily  . methocarbamol  500 mg Oral TID  . metoCLOPramide  5 mg Oral TID AC & HS  . metoprolol  50 mg Oral BID  . pantoprazole  40 mg Oral Q1200  . sodium chloride  3 mL Intravenous Q12H  . warfarin  10 mg Oral ONCE-1800  . warfarin  7.5 mg Oral ONCE-1800  . Warfarin - Pharmacist Dosing Inpatient   Does not apply q1800   Continuous Infusions:    . 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 Has been started on anticoagulation with lovenox and coumadin INR is 1.33 Work up is in progress for hypercoagulable state. Plan was discussed with Haem/onc on call Dr Darrold Span.  Left flank pain Pain is better with opioids. CT  Abdomen pelvis is negative for pyelonephritis Patient has DJD as per records from [facility] MRI of the lumbosacral spine shows disc herniation. Will also obtain xray ribs. Start Robaxin 500 mg po tid Will neurosurgery consult.  Diabetes Mellitus Continue sliding scale insulin   Alyzah Pelly S [facility] Pager: [phone] 09/05/2012, 10:58 AM

## 2012-09-05 NOTE — Progress Notes (Signed)
ANTICOAGULATION CONSULT NOTE - Follow Up Consult  Pharmacy Consult for Lovenox and Coumadin Indication: splenic infarct  Allergies  Allergen Reactions  . Keflex (Cephalexin) Itching and Swelling    Lips and eyes swelled up  . Latex Itching  . Sulfa Antibiotics Rash  . Sulfur Rash    Patient Measurements: Height: 5' 8.9" (175 cm) Weight: 239 lb 3.2 oz (108.5 kg) IBW/kg (Calculated) : 65.97   Vital Signs: Temp: 98.2 F (36.8 C) (09/16 0502) Temp src: Oral (09/16 0502) BP: 115/74 mmHg (09/16 1003) Pulse Rate: 75  (09/16 1003)  Labs:  Basename 09/05/12 0721 09/04/12 1106 09/04/12 0603 09/03/12 0710  HGB -- -- -- 12.7  HCT -- -- -- 38.1  PLT -- -- -- 345  APTT -- -- -- --  LABPROT 16.7* -- 17.2* 14.4  INR 1.33 -- 1.38 1.10  HEPARINUNFRC -- -- -- --  CREATININE -- 0.93 -- --  CKTOTAL -- -- -- --  CKMB -- -- -- --  TROPONINI -- -- -- --    Estimated Creatinine Clearance: 82.2 ml/min (by C-G formula based on Cr of 0.93).  Assessment:   INR down slightly from 9/15.  Coumadin dose not charted from 9/15 pm.  Has had Coumadin 7.5 mg daily x 3 (9/12-9/14).   Continues on Lovenox 110 mg SQ q12hrs and to continue until INR >2 x 24hrs.  May go home on Lovenox bridge. Self-administered Lovenox 9/13 pm.    Goal of Therapy:  INR 2-3 Anti-Xa level 0.6-1.2 units/ml 4hrs after LMWH dose given Monitor platelets by anticoagulation protocol: Yes   Plan:   Coumadin 10 mg x 1 this am.  Continue Lovenox 110 mg SQ q12hrs.  Daily PT/INR while in the hospital.  CBC in am.  Dennie Fetters, RPh Pager: [phone] 09/05/2012,10:04 AM

## 2012-09-05 NOTE — Consult Note (Signed)
Reason for Consult:  Possible right-sided lower thoracic disc herniation, and the patient complaining of right flank discomfort  Referring Physician:  Dr. Mauro Kaufmann  Joy Butler is an 61 y.o. right-handed black female. History was obtained from the patient as well as from review of the records available within her Epic chart.  HPI: Patient reports aching discomfort in the right flank for the past month. She feels it has been getting worse. She was hospitalized 08/17/2012, the admission H&P notes a chief complaint being that of "abdominal pain". Patient was readmitted 5 days ago on August 31, 2012, the admission H&P notes a chief complaint being that of "right-sided flank". CT scan of the abdomen and pelvis was done on August 28 and again on September 11, there was the interval development of a splenic infarct, and the patient was admitted for further workup and anticoagulation. Notably she has a history of a pulmonary embolism suffered 2011, and she was on Coumadin up until April of this year. Dr. Sharl Ma obtained a MRI of the lumbar spine yesterday which revealed a variety of degenerative changes in the lower lumbar spine, but also limited visualization of the lower thoracic spine which raised the question of disc herniations the right at T10-11 and T11-12 levels.  Neurosurgical consultation was requested by Dr. Sharl Ma after an MRI was done yesterday for further recommendations. In reviewing the MRI, there are questions regarding whether that may be disc protrusions to the right side at the T10-11 and T11-12 levels, the L1-2 and L2-3 levels are unremarkable, at L3-4 there is moderate bilateral hypertrophic facet arthropathy, at L4-5 there is broad-based spondylitic disc protrusion with prominent bilateral hypertrophic facet arthropathy and resulting moderate multifactorial lumbar stenosis, and at L5-S1 there is spondylitic disc protrusion (more than the right and the left) as well as hypertrophic facet  arthropathy particularly to the right side. After reviewing the lumbar MRI, I recommended to Dr. Sharl Ma a dedicated MRI of the thoracolumbar junction so as to optimally visualized the T10-11 and T11-12 levels.  Symptomatically the patient describes an aching discomfort in the right flank for the past month, which she feels has been getting worse. She also describes some chronic discomfort in the lower lumbar region, which apparently was evaluated previously in [place], and for which she was told that she had arthritic degenerative changes. She denies any radicular pain, numbness, paresthesias, or weakness into the lower extremities.    Past Medical History:  Past Medical History  Diagnosis Date  . Diabetes mellitus   . Hypertension   . Pulmonary embolism   . UTI (urinary tract infection)     Past Surgical History:  Past Surgical History  Procedure Date  . Abdominal hysterectomy   . Cholecystectomy   . Esophagogastroduodenoscopy 08/19/2012    Procedure: ESOPHAGOGASTRODUODENOSCOPY (EGD);  Surgeon: Vertell Novak., MD;  Location: [facility] ENDOSCOPY;  Service: Endoscopy;  Laterality: N/A;    Family History: No family history on file.  Patient reports that she did not know her parents and therefore does not have any available family history.  Social History:  reports that she has been smoking Cigarettes.  She has a 2.5 pack-year smoking history. She does not have any smokeless tobacco history on file. She reports that she does not drink alcohol or use illicit drugs.  Patient reports that she is smoked since she was a teenager, she did quit for about a year beginning in 2010, at this point smokes about half a pack per day. She  denies drinking alcoholic beverages.  Allergies:  Allergies  Allergen Reactions  . Keflex (Cephalexin) Itching and Swelling    Lips and eyes swelled up  . Latex Itching  . Sulfa Antibiotics Rash  . Sulfur Rash    Medications: I have reviewed the patient's current  medications.  Results for orders placed during the hospital encounter of 08/31/12 (from the past 48 hour(s))  GLUCOSE, CAPILLARY     Status: Abnormal   Collection Time   09/03/12  5:00 PM      Component Value Range Comment   Glucose-Capillary 162 (*) 70 - 99 mg/dL   GLUCOSE, CAPILLARY     Status: Abnormal   Collection Time   09/03/12  9:47 PM      Component Value Range Comment   Glucose-Capillary 165 (*) 70 - 99 mg/dL    Comment 1 Notify RN     PROTIME-INR     Status: Abnormal   Collection Time   09/04/12  6:03 AM      Component Value Range Comment   Prothrombin Time 17.2 (*) 11.6 - 15.2 seconds    INR 1.38  0.00 - 1.49   GLUCOSE, CAPILLARY     Status: Abnormal   Collection Time   09/04/12  7:47 AM      Component Value Range Comment   Glucose-Capillary 171 (*) 70 - 99 mg/dL   BASIC METABOLIC PANEL     Status: Abnormal   Collection Time   09/04/12 11:06 AM      Component Value Range Comment   Sodium 136  135 - 145 mEq/L    Potassium 3.9  3.5 - 5.1 mEq/L    Chloride 98  96 - 112 mEq/L    CO2 29  19 - 32 mEq/L    Glucose, Bld 201 (*) 70 - 99 mg/dL    BUN 15  6 - 23 mg/dL    Creatinine, Ser 1.61  0.50 - 1.10 mg/dL    Calcium 9.6  8.4 - 09.6 mg/dL    GFR calc non Af Amer 65 (*) >90 mL/min    GFR calc Af Amer 75 (*) >90 mL/min   MAGNESIUM     Status: Normal   Collection Time   09/04/12 11:06 AM      Component Value Range Comment   Magnesium 2.1  1.5 - 2.5 mg/dL   GLUCOSE, CAPILLARY     Status: Abnormal   Collection Time   09/04/12 12:17 PM      Component Value Range Comment   Glucose-Capillary 184 (*) 70 - 99 mg/dL    Comment 1 Notify RN     GLUCOSE, CAPILLARY     Status: Abnormal   Collection Time   09/04/12  5:35 PM      Component Value Range Comment   Glucose-Capillary 161 (*) 70 - 99 mg/dL   GLUCOSE, CAPILLARY     Status: Abnormal   Collection Time   09/04/12  9:51 PM      Component Value Range Comment   Glucose-Capillary 135 (*) 70 - 99 mg/dL    Comment 1 Notify RN      PROTIME-INR     Status: Abnormal   Collection Time   09/05/12  7:21 AM      Component Value Range Comment   Prothrombin Time 16.7 (*) 11.6 - 15.2 seconds    INR 1.33  0.00 - 1.49   GLUCOSE, CAPILLARY     Status: Abnormal   Collection Time  09/05/12  7:55 AM      Component Value Range Comment   Glucose-Capillary 165 (*) 70 - 99 mg/dL   GLUCOSE, CAPILLARY     Status: Abnormal   Collection Time   09/05/12 12:11 PM      Component Value Range Comment   Glucose-Capillary 190 (*) 70 - 99 mg/dL     Dg Ribs Unilateral W/chest Right  09/04/2012  *RADIOLOGY REPORT*  Clinical Data: Right lower rib pain after a fall.  RIGHT RIBS AND CHEST - 3+ VIEW  Comparison: Portable chest x-ray 08/16/2012.  Findings: No fractures identified involving the right ribs.  No intrinsic osseous abnormalities.  Cardiac silhouette mildly enlarged.  Linear scarring in the right mid lung, unchanged.  Lungs otherwise clear.  No pleural effusions. No pneumothorax.  Note made of degenerative changes in the right shoulder with calcified intra-articular loose bodies.  IMPRESSION:  1.  No right rib fractures identified. 2.  Mild cardiomegaly.  Scarring in the right mid lung.  No acute cardiopulmonary disease.   Original Report Authenticated By: Arnell Sieving, M.D.    Mr Lumbar Spine Wo Contrast  09/04/2012  *RADIOLOGY REPORT*  Clinical Data: Acute low back pain.  The patient refused contrast imaging.  MRI LUMBAR SPINE WITHOUT CONTRAST  Technique:  Multiplanar and multiecho pulse sequences of the lumbar spine were obtained without intravenous contrast.  Comparison: Radiography 09/13.  CT 09/13.  Findings: T 10/11:  There may be a right posterolateral disc herniation at that level.  This was not studied in detail.  T11-12 shows a shallow disc protrusion towards the left with some potential for foraminal compromise.  T12-L1 is normal.  L1-2:  Normal interspace.  L2-3:  Normal interspace.  L3-4:  Mild bulging of the disc.  Bilateral  facet degeneration and hypertrophy.  Mild narrowing of the lateral recesses and neural foramina without definite neural compression.  L4-5:  Disc degeneration with a broad-based disc herniation. Pronounced facet arthropathy with marked hypertrophic change. Moderate stenosis of both lateral recesses and neural foramina. This appearance could worsen with standing or flexion.  L5-S1:  Shallow disc protrusion more prominent towards the right. Bilateral facet degeneration and hypertrophy right worse than left. Narrowing of the subarticular lateral recess and the intervertebral foramen on the right that could cause neural irritation.  IMPRESSION: The T10-11 level is included on the edge of the field but not studied in the axial plane.  There appears to be a right posterolateral disc herniation at this level.  This area was not studied in detail.  T11-12:  Similar caveat.  Shallow disc protrusion more pronounced towards the left .  L3-4:  Facet arthropathy.  Mild narrowing of the lateral recesses and neural foramina.  L4-5:  Broad-based disc herniation.  Pronounced facet arthropathy. Stenosis of the lateral recesses and foramina that could cause neural compression.  The appearance could worsen with standing or flexion.  L5-S1:  Shallow disc herniation more prominent towards the right. Facet degeneration hypertrophy right more than left.  Narrowing of the subarticular lateral recess and foramen on the right that could effect the right-sided nerve roots.   Original Report Authenticated By: Thomasenia Sales, M.D.    Mr Sacrum/si Joints Wo Contrast  09/04/2012  *RADIOLOGY REPORT*  Clinical Data: Severe low back pain.  MR SACRUM WITHOUT CONTRAST  Technique: Scanning of the bony pelvis focusing on the sacroiliac joints.  Comparison:  Radiography 09/13.  CT 09/13.  Findings: Both sacroiliac joints show pronounced osteoarthritis with fluid  in the joints and adjacent bone sclerosis and edema. These joints could certainly be a  cause of pain.  No evidence of sacral fracture or other focal sacral lesion.  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   Original Report Authenticated By: Thomasenia Sales, M.D.    Physical examination: Patient is a obese black female in no acute distress.  Blood pressure 115/74, pulse 75, temperature 98.2 F (36.8 C), temperature source Oral, resp. rate 18, height 5' 8.9" (1.75 m), weight 108.5 kg (239 lb 3.2 oz), SpO2 98.00%.  Musculoskeletal examination shows no tenderness over the thoracic spinous processes or para thoracic musculature, no tenderness in the upper lumbar region, but mild diffuse discomfort to palpation in the lower lumbar region. She is able to flex to 90, she is able to extend to 10. Straight leg raising is negative bilaterally.  Neurologic examination shows a motor examination 5 over 5 strength to the lower extremities including the iliopsoas, quadriceps, dorsiflexor, EHL, and plantar flexor. Sensation is intact to pinprick in the distal lower extremities. Reflexes are absent at the quadriceps and gastrocnemius, they're symmetrical, toes are downgoing bilaterally. Mild unsteadiness of gait and stance.   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 However she describes discomfort in the right flank for the past month, and MRI yesterday is suspicious for possible disc herniations to the right at T10-11 and/or T 11-12 and it is possible that those could be that cause of her right flank discomfort.  She is neurologically intact on examination.  She has a history of pulmonary embolism in 2011, and was anticoagulated until April of this year, and has been found to have a new splenic infarct. Currently she is anticoagulated with Lovenox and Coumadin.  I discussed the case with Dr. Sharl Ma. I've recommended MRI of the  thoracolumbar junction to optimally visualized T 10-11 and T11-12 levels. If disc herniations to the right at one or more of those levels are confirmed, we would need guidance from the hospitalist service and the hematology service about the feasibility of discontinuing anticoagulation perioperatively. If the anticoagulation could be stopped then surgical intervention could be considered, on the other hand if they do not favor discontinuing anticoagulation then there may be no role for neurosurgical involvement.  I will followup after the thoracolumbar junction MRIs completed.  Hewitt Shorts, MD 09/05/2012, 1:34 PM

## 2012-09-05 NOTE — Progress Notes (Signed)
Patient self administered Lovenox Shot

## 2012-09-06 DIAGNOSIS — I10 Essential (primary) hypertension: Secondary | ICD-10-CM | POA: Diagnosis not present

## 2012-09-06 DIAGNOSIS — G8929 Other chronic pain: Secondary | ICD-10-CM | POA: Diagnosis not present

## 2012-09-06 DIAGNOSIS — M549 Dorsalgia, unspecified: Secondary | ICD-10-CM | POA: Diagnosis not present

## 2012-09-06 DIAGNOSIS — R109 Unspecified abdominal pain: Secondary | ICD-10-CM | POA: Diagnosis not present

## 2012-09-06 DIAGNOSIS — Z86711 Personal history of pulmonary embolism: Secondary | ICD-10-CM

## 2012-09-06 LAB — PROTIME-INR
INR: 1.92 — ABNORMAL HIGH (ref 0.00–1.49)
Prothrombin Time: 22.3 seconds — ABNORMAL HIGH (ref 11.6–15.2)

## 2012-09-06 LAB — CBC
Platelets: 374 10*3/uL (ref 150–400)
RBC: 4.94 MIL/uL (ref 3.87–5.11)
RDW: 15.4 % (ref 11.5–15.5)
WBC: 7.1 10*3/uL (ref 4.0–10.5)

## 2012-09-06 LAB — SEDIMENTATION RATE: Sed Rate: 65 mm/hr — ABNORMAL HIGH (ref 0–22)

## 2012-09-06 LAB — GLUCOSE, CAPILLARY: Glucose-Capillary: 191 mg/dL — ABNORMAL HIGH (ref 70–99)

## 2012-09-06 MED ORDER — WARFARIN SODIUM 5 MG PO TABS
5.0000 mg | ORAL_TABLET | Freq: Once | ORAL | Status: AC
  Administered 2012-09-06: 5 mg via ORAL
  Filled 2012-09-06: qty 1

## 2012-09-06 NOTE — Progress Notes (Addendum)
Subjective: Patient seen and examined, admitted for splenic infarct. Started on anticoagulation with lovenox. Hypercoagulable  Panel has been obtained, results are pending. Records from [facility] indicate that she has chronic low back pain, and that she has DJD. Repeat CT abdomen shows stable splenic infarct. Patient continues to have right sided back pain. 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 Severe facet degenerative changes at L5-S1.   Thoracic MRI was done which showed  Abnormal right paraspinal/intercostal process centered at the  T8 level. Legrand Rams this is the symptomatic abnormality, but the  appearance is nonspecific. It does not resemble a typical spinal  infection. Given the patient's hypercoagulable state and splenic  infarcts, right consider whether this may be a thrombophlebitis of  the intercostal vessels at this level. I recommend a repeat  thoracic MRI (without and with contrast preferred) in 10-14 days to  further evaluate, and ensure no interval infectious changes in the  adjacent spine.     Objective: Vital signs in last 24 hours: Temp:  [98.1 F (36.7 C)-98.5 F (36.9 C)] 98.5 F (36.9 C) (09/17 0525) Pulse Rate:  [63-80] 73  (09/17 1000) Resp:  [18] 18  (09/17 0525) BP: (110-138)/(73-82) 110/73 mmHg (09/17 1000) SpO2:  [95 %-98 %] 95 % (09/17 0525) Weight change:  Last BM Date: 09/06/12  Intake/Output from previous day: 09/16 0701 - 09/17 0700 In: 243 [P.O.:240; I.V.:3] Out: 651 [Urine:651] Total I/O In: 240 [P.O.:240] Out: 50 [Urine:50]   Physical Exam: Head: Normocephalic, atraumatic.  Eyes: No signs of jaundice, EOMI Nose: Mucous membranes dry.  Neck: supple,No deformities, masses, or tenderness noted. Lungs: Normal respiratory effort. B/L Clear to auscultation, no crackles or wheezes.  Heart: Regular RR. S1 and S2 normal  Abdomen: BS normoactive. Soft, Nondistended, non-tender. Positive  right flank tenderness Extremities: No pretibial edema, no erythema   Lab Results: Basic Metabolic Panel:  Basename 09/04/12 1106  NA 136  K 3.9  CL 98  CO2 29  GLUCOSE 201*  BUN 15  CREATININE 0.93  CALCIUM 9.6  MG 2.1  PHOS --   CBC:  Basename 09/06/12 0445  WBC 7.1  NEUTROABS --  HGB 13.7  HCT 40.9  MCV 82.8  PLT 374   CBG:  Basename 09/06/12 1153 09/06/12 0809 09/05/12 2158 09/05/12 1734 09/05/12 1211 09/05/12 0755  GLUCAP 140* 209* 158* 138* 190* 165*   Hemoglobin A1C: No results found for this basename: HGBA1C in the last 72 hours Coagulation:  Basename 09/06/12 0445 09/05/12 0721  LABPROT 22.3* 16.7*  INR 1.92* 1.33      Studies/Results: Dg Ribs Unilateral W/chest Right  09/04/2012  *RADIOLOGY REPORT*  Clinical Data: Right lower rib pain after a fall.  RIGHT RIBS AND CHEST - 3+ VIEW  Comparison: Portable chest x-ray 08/16/2012.  Findings: No fractures identified involving the right ribs.  No intrinsic osseous abnormalities.  Cardiac silhouette mildly enlarged.  Linear scarring in the right mid lung, unchanged.  Lungs otherwise clear.  No pleural effusions. No pneumothorax.  Note made of degenerative changes in the right shoulder with calcified intra-articular loose bodies.  IMPRESSION:  1.  No right rib fractures identified. 2.  Mild cardiomegaly.  Scarring in the right mid lung.  No acute cardiopulmonary disease.   Original Report Authenticated By: Arnell Sieving, M.D.    Mr Thoracic Spine Wo Contrast  09/05/2012  *RADIOLOGY REPORT*  Clinical Data: 61 year old female with pain at the right flank. Abnormal lower  thoracic spine seen on recent lumbar MRI.  MRI THORACIC SPINE WITHOUT CONTRAST  Technique:  Multiplanar and multiecho pulse sequences of the thoracic spine were obtained without intravenous contrast.  Comparison: Lumbar MRI 09/04/2012.  Findings: Limited sagittal imaging the cervical spine suggests moderate to severe spondylosis at C4-C5 and C5-C6.   Diffuse spondylosis continues throughout the thoracic spine.  There is multifactorial spinal stenosis at T10-T11 and T11-T12 as suspected.  At the latter there is severe facet degeneration also with trace fluid in both facet joints.  However, the degree of spinal stenosis at these levels is not significantly different from that multiple additional thoracic levels, including T8-T9 (series 10 image 2), T7 and T8 (series 9 image 23) and others.  There is severe right side foraminal stenosis at T10-T11 which is multifactorial.  And this is more pronounced than the degenerative foraminal stenosis seen elsewhere in the thoracic spine.  There is focal prevertebral and right paraspinal inflammation as evidenced by abnormal increased STIR signal (series 8).  The configuration of the abnormal signal is unusual and seems to correspond to the course of the ventral and right intercostal vessels.  The discs at these levels appear degenerated, but without strong evidence of disc inflammation.  There is trace endplate edema at T 89, but this too appears degenerative.  There are large endplate osteophytes at this level, as at other levels in the thoracic spine.  No epidural or posterior paraspinal inflammation.  Despite the thoracic spinal stenosis the thoracic spinal cord signal is within normal limits.  The conus medullaris is re- identified at L1.  Previously seen small right pleural effusion is no longer evident. Abnormal signal in the spleen compatible with known splenic infarcts is again noted.  IMPRESSION: 1.  Abnormal right paraspinal/intercostal process centered at the T8 level. Legrand Rams this is the symptomatic abnormality, but the appearance is nonspecific.  It does not resemble a typical spinal infection.  Given the patient's hypercoagulable state and splenic infarcts, right consider whether this may be a thrombophlebitis of the intercostal vessels at this level.  I recommend a repeat thoracic MRI (without and with contrast  preferred) in 10-14 days to further evaluate, and ensure no interval infectious changes in the adjacent spine.  2.  There is moderate to severe degenerative disease at the T10-T11 and T11-T12 levels as noted on the lumbar comparison, however, there is also advanced spondylosis elsewhere in the thoracic spine and also likely in the cervical spine.  I favor this is longstanding and not related to the acute presentation. 3.  Splenic infarct.  Recently seen small right pleural effusion no longer identified.  I discussed this case by telephone with Drs. [place] and Nudelman at the time of dictation. Dr. Sharl Ma advised me that this patient has been afebrile and without leukocytosis during this admission.   Original Report Authenticated By: Harley Hallmark, M.D.    Mr Lumbar Spine Wo Contrast  09/04/2012  *RADIOLOGY REPORT*  Clinical Data: Acute low back pain.  The patient refused contrast imaging.  MRI LUMBAR SPINE WITHOUT CONTRAST  Technique:  Multiplanar and multiecho pulse sequences of the lumbar spine were obtained without intravenous contrast.  Comparison: Radiography 09/13.  CT 09/13.  Findings: T 10/11:  There may be a right posterolateral disc herniation at that level.  This was not studied in detail.  T11-12 shows a shallow disc protrusion towards the left with some potential for foraminal compromise.  T12-L1 is normal.  L1-2:  Normal interspace.  L2-3:  Normal interspace.  L3-4:  Mild bulging of the disc.  Bilateral facet degeneration and hypertrophy.  Mild narrowing of the lateral recesses and neural foramina without definite neural compression.  L4-5:  Disc degeneration with a broad-based disc herniation. Pronounced facet arthropathy with marked hypertrophic change. Moderate stenosis of both lateral recesses and neural foramina. This appearance could worsen with standing or flexion.  L5-S1:  Shallow disc protrusion more prominent towards the right. Bilateral facet degeneration and hypertrophy right worse than  left. Narrowing of the subarticular lateral recess and the intervertebral foramen on the right that could cause neural irritation.  IMPRESSION: The T10-11 level is included on the edge of the field but not studied in the axial plane.  There appears to be a right posterolateral disc herniation at this level.  This area was not studied in detail.  T11-12:  Similar caveat.  Shallow disc protrusion more pronounced towards the left .  L3-4:  Facet arthropathy.  Mild narrowing of the lateral recesses and neural foramina.  L4-5:  Broad-based disc herniation.  Pronounced facet arthropathy. Stenosis of the lateral recesses and foramina that could cause neural compression.  The appearance could worsen with standing or flexion.  L5-S1:  Shallow disc herniation more prominent towards the right. Facet degeneration hypertrophy right more than left.  Narrowing of the subarticular lateral recess and foramen on the right that could effect the right-sided nerve roots.   Original Report Authenticated By: Thomasenia Sales, M.D.    Mr Sacrum/si Joints Wo Contrast  09/04/2012  *RADIOLOGY REPORT*  Clinical Data: Severe low back pain.  MR SACRUM WITHOUT CONTRAST  Technique: Scanning of the bony pelvis focusing on the sacroiliac joints.  Comparison:  Radiography 09/13.  CT 09/13.  Findings: Both sacroiliac joints show pronounced osteoarthritis with fluid in the joints and adjacent bone sclerosis and edema. These joints could certainly be a cause of pain.  No evidence of sacral fracture or other focal sacral lesion.  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   Original Report Authenticated By: Thomasenia Sales, M.D.     Medications: Scheduled Meds:    . amLODipine  10 mg Oral Daily  . enoxaparin (LOVENOX) injection  110 mg Subcutaneous Q12H  . enoxaparin   Does not apply Once  . hydrochlorothiazide  25 mg Oral Daily  . insulin aspart  0-15 Units Subcutaneous TID WC  . insulin glargine  15 Units  Subcutaneous QHS  . LORazepam  2 mg Intravenous Once  . losartan  100 mg Oral Daily  . methocarbamol  500 mg Oral TID  . metoCLOPramide  5 mg Oral TID AC & HS  . metoprolol  50 mg Oral BID  . pantoprazole  40 mg Oral Q1200  . sodium chloride  3 mL Intravenous Q12H  . warfarin  7.5 mg Oral ONCE-1800  . Warfarin - Pharmacist Dosing Inpatient   Does not apply q1800   Continuous Infusions:    . 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 Has been started on anticoagulation with lovenox and coumadin INR is 1.33 Work up is in progress for hypercoagulable state. Plan was discussed with Haem/onc on call Dr Darrold Span.   Left flank pain Pain is better with opioids. CT Abdomen pelvis is negative for  pyelonephritis Patient has DJD as per records from [facility] MRI of the lumbosacral spine shows disc herniation. MRI thoracic spine shows abnormal; inertrcostal/parasopinal process at the level of T8 neurosurgery following.  ? Paraspinal infection Will get ID consult  Hypercoagulable state Lupus anticoagulant is 50.5 elevated Continue lovenox and coumadin  Diabetes Mellitus Continue sliding scale insulin  IMPORTANT: Patient will need repeat MRI  Thoracic spine in 10-14 days to check for the resolution of the praspinal process at the T8. Also ordered CT chest to r/o underlying lung pathology to explain the T8 lesion. Follow CT chest results [facility] Pager: [phone] 09/06/2012, 1:54 PM

## 2012-09-06 NOTE — Consult Note (Addendum)
INFECTIOUS DISEASE CONSULT NOTE  Date of Admission:  08/31/2012  Date of Consult:  09/06/2012  Reason for Consult: Spenic Infarct, SI joint OA/joint fluid Referring Physician: [place]  Impression/Recommendation Splenic Infarct T8 paraspinal lesion  Await coagulopathy work-up Consider TEE if BCx+? Check BCx Hold anbx for now Consider aspirate of (SI) joint fluid Check HIV, RPR, Hepatitis B/C Consider IR eval for aspirate.   Comment- I am not clear that there is an infectious etiology underlying this syndrome. Will ask IR to eval for possible aspirate. Would consider w/u for PNH, SLE, other inflamatory arthritides. Certainly endocarditis could tie this all together but she is afebrile and WBC have been normal.   Thank you so much for this interesting consult,   Joy Butler [phone]  Joy Butler is an 61 y.o. female.  HPI: 61 yo F with hx of DM2, HTN and PE (2012), comes to hospital on 8-28 with abd pain and diarrhea. She had been off her home meds for several months at that time. She was felt to have UTI, was treated with ceftriaxone. She had a CT abd unremarkable except for air in her bladder. She was eval by Uro/Gen Surgery/GI. 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 She was started on reglan.  She was transitioned from ceftriaxone to keflex in the hospital and developed n/v, swollen face and pruritis. She was given a single dose of fosphomycin to complete her UTI therapy on 8-31 and was d/c home on 9-1.  She returns on 9-11 with return of her abdominal pain. A repeat CT abd now shows a splenic infarct. She underwent MRI lumbar spine on 9-115 to work up her pain which revealed- multi-level disc herniation and bilateral sacroiliac joint osteoarthritis. Has been afebrile, normal WBC   Past Medical History  Diagnosis Date  . Diabetes mellitus   . Hypertension   . Pulmonary embolism     . UTI (urinary tract infection)     Past Surgical History  Procedure Date  . Abdominal hysterectomy   . Cholecystectomy   . Esophagogastroduodenoscopy 08/19/2012    Procedure: ESOPHAGOGASTRODUODENOSCOPY (EGD);  Surgeon: Vertell Novak., MD;  Location: [facility] ENDOSCOPY;  Service: Endoscopy;  Laterality: N/A;     Allergies  Allergen Reactions  . Keflex (Cephalexin) Itching and Swelling    Lips and eyes swelled up  . Latex Itching  . Sulfa Antibiotics Rash  . Sulfur Rash    Medications:  Scheduled:   . amLODipine  10 mg Oral Daily  . enoxaparin (LOVENOX) injection  110 mg Subcutaneous Q12H  . enoxaparin   Does not apply Once  . hydrochlorothiazide  25 mg Oral Daily  . insulin aspart  0-15 Units Subcutaneous TID WC  . insulin glargine  15 Units Subcutaneous QHS  . LORazepam  2 mg Intravenous Once  . losartan  100 mg Oral Daily  . methocarbamol  500 mg Oral TID  . metoCLOPramide  5 mg Oral TID AC & HS  . metoprolol  50 mg Oral BID  . pantoprazole  40 mg Oral Q1200  . sodium chloride  3 mL Intravenous Q12H  . warfarin  7.5 mg Oral ONCE-1800  . Warfarin - Pharmacist Dosing Inpatient   Does not apply q1800    Total days of antibiotics 0  Social History:  reports that she has been smoking Cigarettes.  She has a 2.5 pack-year smoking history. She does  not have any smokeless tobacco history on file. She reports that she does not drink alcohol or use illicit drugs.  No family history on file.  General ROS: has had ophtho exam this year, has neuropathy, normal BM, normal urination, no joint swelling or tenderness, see HPI.   Blood pressure 110/73, pulse 73, temperature 98.5 F (36.9 C), temperature source Oral, resp. rate 18, height 5' 8.9" (1.75 m), weight 108.5 kg (239 lb 3.2 oz), SpO2 95.00%. General appearance: alert, cooperative and no distress Eyes: negative findings: pupils equal, round, reactive to light and accomodation Throat: normal findings: oropharynx pink & moist  without lesions or evidence of thrush Neck: no adenopathy Lungs: clear to auscultation bilaterally Heart: regular rate and rhythm Abdomen: normal findings: bowel sounds normal and soft and abnormal findings:  distended, obese and RMQ point tenderness.  Extremities: normal light touch, no diabetic foot lesions.  SI joint tenderness.    Results for orders placed during the hospital encounter of 08/31/12 (from the past 48 hour(s))  GLUCOSE, CAPILLARY     Status: Abnormal   Collection Time   09/04/12  5:35 PM      Component Value Range Comment   Glucose-Capillary 161 (*) 70 - 99 mg/dL   GLUCOSE, CAPILLARY     Status: Abnormal   Collection Time   09/04/12  9:51 PM      Component Value Range Comment   Glucose-Capillary 135 (*) 70 - 99 mg/dL    Comment 1 Notify RN     PROTIME-INR     Status: Abnormal   Collection Time   09/05/12  7:21 AM      Component Value Range Comment   Prothrombin Time 16.7 (*) 11.6 - 15.2 seconds    INR 1.33  0.00 - 1.49   GLUCOSE, CAPILLARY     Status: Abnormal   Collection Time   09/05/12  7:55 AM      Component Value Range Comment   Glucose-Capillary 165 (*) 70 - 99 mg/dL   GLUCOSE, CAPILLARY     Status: Abnormal   Collection Time   09/05/12 12:11 PM      Component Value Range Comment   Glucose-Capillary 190 (*) 70 - 99 mg/dL   GLUCOSE, CAPILLARY     Status: Abnormal   Collection Time   09/05/12  5:34 PM      Component Value Range Comment   Glucose-Capillary 138 (*) 70 - 99 mg/dL   GLUCOSE, CAPILLARY     Status: Abnormal   Collection Time   09/05/12  9:58 PM      Component Value Range Comment   Glucose-Capillary 158 (*) 70 - 99 mg/dL   CBC     Status: Normal   Collection Time   09/06/12  4:45 AM      Component Value Range Comment   WBC 7.1  4.0 - 10.5 K/uL    RBC 4.94  3.87 - 5.11 MIL/uL    Hemoglobin 13.7  12.0 - 15.0 g/dL    HCT 16.1  09.6 - 04.5 %    MCV 82.8  78.0 - 100.0 fL    MCH 27.7  26.0 - 34.0 pg    MCHC 33.5  30.0 - 36.0 g/dL    RDW 40.9   81.1 - 91.4 %    Platelets 374  150 - 400 K/uL   PROTIME-INR     Status: Abnormal   Collection Time   09/06/12  4:45 AM      Component Value  Range Comment   Prothrombin Time 22.3 (*) 11.6 - 15.2 seconds    INR 1.92 (*) 0.00 - 1.49   GLUCOSE, CAPILLARY     Status: Abnormal   Collection Time   09/06/12  8:09 AM      Component Value Range Comment   Glucose-Capillary 209 (*) 70 - 99 mg/dL   GLUCOSE, CAPILLARY     Status: Abnormal   Collection Time   09/06/12 11:53 AM      Component Value Range Comment   Glucose-Capillary 140 (*) 70 - 99 mg/dL       Component Value Date/Time   SDES STOOL 08/18/2012 1340   SPECREQUEST NONE 08/18/2012 1340   CULT NO SALMONELLA, SHIGELLA, CAMPYLOBACTER, YERSINIA, OR E.COLI 0157:[id number] ISOLATED 08/18/2012 1340   REPTSTATUS 08/22/2012 FINAL 08/18/2012 1340   Dg Ribs Unilateral W/chest Right  09/04/2012  *RADIOLOGY REPORT*  Clinical Data: Right lower rib pain after a fall.  RIGHT RIBS AND CHEST - 3+ VIEW  Comparison: Portable chest x-ray 08/16/2012.  Findings: No fractures identified involving the right ribs.  No intrinsic osseous abnormalities.  Cardiac silhouette mildly enlarged.  Linear scarring in the right mid lung, unchanged.  Lungs otherwise clear.  No pleural effusions. No pneumothorax.  Note made of degenerative changes in the right shoulder with calcified intra-articular loose bodies.  IMPRESSION:  1.  No right rib fractures identified. 2.  Mild cardiomegaly.  Scarring in the right mid lung.  No acute cardiopulmonary disease.   Original Report Authenticated By: Arnell Sieving, M.D.    Mr Thoracic Spine Wo Contrast  09/05/2012  *RADIOLOGY REPORT*  Clinical Data: 61 year old female with pain at the right flank. Abnormal lower thoracic spine seen on recent lumbar MRI.  MRI THORACIC SPINE WITHOUT CONTRAST  Technique:  Multiplanar and multiecho pulse sequences of the thoracic spine were obtained without intravenous contrast.  Comparison: Lumbar MRI 09/04/2012.   Findings: Limited sagittal imaging the cervical spine suggests moderate to severe spondylosis at C4-C5 and C5-C6.  Diffuse spondylosis continues throughout the thoracic spine.  There is multifactorial spinal stenosis at T10-T11 and T11-T12 as suspected.  At the latter there is severe facet degeneration also with trace fluid in both facet joints.  However, the degree of spinal stenosis at these levels is not significantly different from that multiple additional thoracic levels, including T8-T9 (series 10 image 2), T7 and T8 (series 9 image 23) and others.  There is severe right side foraminal stenosis at T10-T11 which is multifactorial.  And this is more pronounced than the degenerative foraminal stenosis seen elsewhere in the thoracic spine.  There is focal prevertebral and right paraspinal inflammation as evidenced by abnormal increased STIR signal (series 8).  The configuration of the abnormal signal is unusual and seems to correspond to the course of the ventral and right intercostal vessels.  The discs at these levels appear degenerated, but without strong evidence of disc inflammation.  There is trace endplate edema at T 89, but this too appears degenerative.  There are large endplate osteophytes at this level, as at other levels in the thoracic spine.  No epidural or posterior paraspinal inflammation.  Despite the thoracic spinal stenosis the thoracic spinal cord signal is within normal limits.  The conus medullaris is re- identified at L1.  Previously seen small right pleural effusion is no longer evident. Abnormal signal in the spleen compatible with known splenic infarcts is again noted.  IMPRESSION: 1.  Abnormal right paraspinal/intercostal process centered at the T8 level. Favor  this is the symptomatic abnormality, but the appearance is nonspecific.  It does not resemble a typical spinal infection.  Given the patient's hypercoagulable state and splenic infarcts, right consider whether this may be a  thrombophlebitis of the intercostal vessels at this level.  I recommend a repeat thoracic MRI (without and with contrast preferred) in 10-14 days to further evaluate, and ensure no interval infectious changes in the adjacent spine.  2.  There is moderate to severe degenerative disease at the T10-T11 and T11-T12 levels as noted on the lumbar comparison, however, there is also advanced spondylosis elsewhere in the thoracic spine and also likely in the cervical spine.  I favor this is longstanding and not related to the acute presentation. 3.  Splenic infarct.  Recently seen small right pleural effusion no longer identified.  I discussed this case by telephone with Drs. [place] and Nudelman at the time of dictation. Dr. Sharl Ma advised me that this patient has been afebrile and without leukocytosis during this admission.   Original Report Authenticated By: Harley Hallmark, M.D.    Mr Lumbar Spine Wo Contrast  09/04/2012  *RADIOLOGY REPORT*  Clinical Data: Acute low back pain.  The patient refused contrast imaging.  MRI LUMBAR SPINE WITHOUT CONTRAST  Technique:  Multiplanar and multiecho pulse sequences of the lumbar spine were obtained without intravenous contrast.  Comparison: Radiography 09/13.  CT 09/13.  Findings: T 10/11:  There may be a right posterolateral disc herniation at that level.  This was not studied in detail.  T11-12 shows a shallow disc protrusion towards the left with some potential for foraminal compromise.  T12-L1 is normal.  L1-2:  Normal interspace.  L2-3:  Normal interspace.  L3-4:  Mild bulging of the disc.  Bilateral facet degeneration and hypertrophy.  Mild narrowing of the lateral recesses and neural foramina without definite neural compression.  L4-5:  Disc degeneration with a broad-based disc herniation. Pronounced facet arthropathy with marked hypertrophic change. Moderate stenosis of both lateral recesses and neural foramina. This appearance could worsen with standing or flexion.  L5-S1:   Shallow disc protrusion more prominent towards the right. Bilateral facet degeneration and hypertrophy right worse than left. Narrowing of the subarticular lateral recess and the intervertebral foramen on the right that could cause neural irritation.  IMPRESSION: The T10-11 level is included on the edge of the field but not studied in the axial plane.  There appears to be a right posterolateral disc herniation at this level.  This area was not studied in detail.  T11-12:  Similar caveat.  Shallow disc protrusion more pronounced towards the left .  L3-4:  Facet arthropathy.  Mild narrowing of the lateral recesses and neural foramina.  L4-5:  Broad-based disc herniation.  Pronounced facet arthropathy. Stenosis of the lateral recesses and foramina that could cause neural compression.  The appearance could worsen with standing or flexion.  L5-S1:  Shallow disc herniation more prominent towards the right. Facet degeneration hypertrophy right more than left.  Narrowing of the subarticular lateral recess and foramen on the right that could effect the right-sided nerve roots.   Original Report Authenticated By: Thomasenia Sales, M.D.    Mr Sacrum/si Joints Wo Contrast  09/04/2012  *RADIOLOGY REPORT*  Clinical Data: Severe low back pain.  MR SACRUM WITHOUT CONTRAST  Technique: Scanning of the bony pelvis focusing on the sacroiliac joints.  Comparison:  Radiography 09/13.  CT 09/13.  Findings: Both sacroiliac joints show pronounced osteoarthritis with fluid in the joints and  adjacent bone sclerosis and edema. These joints could certainly be a cause of pain.  No evidence of sacral fracture or other focal sacral lesion.  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   Original Report Authenticated By: Thomasenia Sales, M.D.    No results found for this or any previous visit (from the past 240 hour(s)).    09/06/2012, 1:44 PM     LOS: 6 days

## 2012-09-06 NOTE — Progress Notes (Signed)
ANTICOAGULATION CONSULT NOTE - Follow Up Consult  Pharmacy Consult for Lovenox and Coumadin Indication: splenic infarct, hypercoagulable  Allergies  Allergen Reactions  . Keflex (Cephalexin) Itching and Swelling    Lips and eyes swelled up  . Latex Itching  . Sulfa Antibiotics Rash  . Sulfur Rash    Patient Measurements: Height: 5' 8.9" (175 cm) Weight: 239 lb 3.2 oz (108.5 kg) IBW/kg (Calculated) : 65.97   Vital Signs: Temp: 98.1 F (36.7 C) (09/17 1455) Temp src: Oral (09/17 1455) BP: 127/69 mmHg (09/17 1455) Pulse Rate: 69  (09/17 1455)  Labs:  Basename 09/06/12 0445 09/05/12 0721 09/04/12 1106 09/04/12 0603  HGB 13.7 -- -- --  HCT 40.9 -- -- --  PLT 374 -- -- --  APTT -- -- -- --  LABPROT 22.3* 16.7* -- 17.2*  INR 1.92* 1.33 -- 1.38  HEPARINUNFRC -- -- -- --  CREATININE -- -- 0.93 --  CKTOTAL -- -- -- --  CKMB -- -- -- --  TROPONINI -- -- -- --    Estimated Creatinine Clearance: 81.1 ml/min (by C-G formula based on Cr of 0.93).  Assessment:   INR is now almost therapeutic.Day # 5 overlap with Lovenox. Has had Coumadin 7.5 mg daily x 3 (9/12-9/14), missed dose on 9/15, 10 mg given on 9/16.   Continues on Lovenox 110 mg SQ q12hrs and to continue until INR >2 x 24hrs.  CBC ok. Thoracic MRI done 9/16 as noted. No surgery planned for now. To repeat MRI in 10-14 days.  Goal of Therapy:  INR 2-3 Anti-Xa level 0.6-1.2 units/ml 4hrs after LMWH dose given Monitor platelets by anticoagulation protocol: Yes   Plan:   Coumadin 5 mg today.  Continue Lovenox 110 mg SQ q12hrs.  Daily PT/INR while in the hospital.   Dennie Fetters, RPh Pager: [phone] 09/06/2012,3:10 PM

## 2012-09-07 DIAGNOSIS — R109 Unspecified abdominal pain: Secondary | ICD-10-CM | POA: Diagnosis not present

## 2012-09-07 DIAGNOSIS — D7389 Other diseases of spleen: Secondary | ICD-10-CM | POA: Diagnosis not present

## 2012-09-07 DIAGNOSIS — E1142 Type 2 diabetes mellitus with diabetic polyneuropathy: Secondary | ICD-10-CM

## 2012-09-07 DIAGNOSIS — G8929 Other chronic pain: Secondary | ICD-10-CM | POA: Diagnosis not present

## 2012-09-07 DIAGNOSIS — K3184 Gastroparesis: Secondary | ICD-10-CM

## 2012-09-07 DIAGNOSIS — E119 Type 2 diabetes mellitus without complications: Secondary | ICD-10-CM | POA: Diagnosis not present

## 2012-09-07 DIAGNOSIS — E1149 Type 2 diabetes mellitus with other diabetic neurological complication: Secondary | ICD-10-CM

## 2012-09-07 DIAGNOSIS — M549 Dorsalgia, unspecified: Secondary | ICD-10-CM | POA: Diagnosis not present

## 2012-09-07 LAB — HEPATITIS PANEL, ACUTE
Hep A IgM: NEGATIVE
Hepatitis B Surface Ag: NEGATIVE

## 2012-09-07 LAB — HIV ANTIBODY (ROUTINE TESTING W REFLEX): HIV: NONREACTIVE

## 2012-09-07 LAB — ANA: Anti Nuclear Antibody(ANA): POSITIVE — AB

## 2012-09-07 LAB — PROTIME-INR: INR: 2.34 — ABNORMAL HIGH (ref 0.00–1.49)

## 2012-09-07 LAB — GLUCOSE, CAPILLARY
Glucose-Capillary: 184 mg/dL — ABNORMAL HIGH (ref 70–99)
Glucose-Capillary: 239 mg/dL — ABNORMAL HIGH (ref 70–99)

## 2012-09-07 LAB — ANCA SCREEN W REFLEX TITER: Atypical p-ANCA Screen: NEGATIVE

## 2012-09-07 MED ORDER — WARFARIN SODIUM 5 MG PO TABS
5.0000 mg | ORAL_TABLET | Freq: Once | ORAL | Status: AC
  Administered 2012-09-07: 5 mg via ORAL
  Filled 2012-09-07: qty 1

## 2012-09-07 NOTE — Progress Notes (Signed)
Brief Nutrition Note:  RD pulled to chart for long length of stay. Pt eating 100% most meals, no new weights available.   Body mass index is 35.43 kg/(m^2). Pt is obese class 2 per current BMI.  Current Diet is Low Sodium.   Medications and labs reviewed, no nutrition interventions warranted at this time. Please consult if needed.   Clarene Duke RD, LDN Pager [phone] After Hours pager [phone]

## 2012-09-07 NOTE — Progress Notes (Addendum)
INFECTIOUS DISEASE PROGRESS NOTE  ID: Joy Butler is a 61 y.o. female with  Active Problems:  HTN (hypertension)  Diabetes mellitus  Personal history of PE (pulmonary embolism)  Chronic back pain  Splenic infarction  Subjective: Complains of back pain. Constant.   Abtx:  Anti-infectives    None      Medications:  Scheduled:   . amLODipine  10 mg Oral Daily  . enoxaparin (LOVENOX) injection  110 mg Subcutaneous Q12H  . enoxaparin   Does not apply Once  . hydrochlorothiazide  25 mg Oral Daily  . insulin aspart  0-15 Units Subcutaneous TID WC  . insulin glargine  15 Units Subcutaneous QHS  . losartan  100 mg Oral Daily  . methocarbamol  500 mg Oral TID  . metoCLOPramide  5 mg Oral TID AC & HS  . metoprolol  50 mg Oral BID  . pantoprazole  40 mg Oral Q1200  . sodium chloride  3 mL Intravenous Q12H  . warfarin  5 mg Oral ONCE-1800  . warfarin  5 mg Oral ONCE-1800  . Warfarin - Pharmacist Dosing Inpatient   Does not apply q1800    Objective: Vital signs in last 24 hours: Temp:  [98 F (36.7 C)-98.2 F (36.8 C)] 98.2 F (36.8 C) (09/18 1443) Pulse Rate:  [66-81] 66  (09/18 1443) Resp:  [16-20] 18  (09/18 1443) BP: (117-135)/(65-81) 126/65 mmHg (09/18 1443) SpO2:  [96 %-97 %] 96 % (09/18 1443)   General appearance: alert and no distress Resp: clear to auscultation bilaterally Cardio: regular rate and rhythm GI: normal findings: bowel sounds normal and soft, non-tender  Lab Results  Basename 09/06/12 0445  WBC 7.1  HGB 13.7  HCT 40.9  NA --  K --  CL --  CO2 --  BUN --  CREATININE --  GLU --   Liver Panel No results found for this basename: PROT:2,ALBUMIN:2,AST:2,ALT:2,ALKPHOS:2,BILITOT:2,BILIDIR:2,IBILI:2 in the last 72 hours Sedimentation Rate  Basename 09/06/12 1521  ESRSEDRATE 65*   C-Reactive Protein No results found for this basename: CRP:2 in the last 72 hours  Microbiology: Recent Results (from the past 240 hour(s))  CULTURE,  BLOOD (ROUTINE X 2)     Status: Normal (Preliminary result)   Collection Time   09/06/12  3:10 PM      Component Value Range Status Comment   Specimen Description BLOOD RIGHT ARM   Final    Special Requests BOTTLES DRAWN AEROBIC AND ANAEROBIC 10CC   Final    Culture  Setup Time 09/06/2012 21:06   Final    Culture     Final    Value:        BLOOD CULTURE RECEIVED NO GROWTH TO DATE CULTURE WILL BE HELD FOR 5 DAYS BEFORE ISSUING A FINAL NEGATIVE REPORT   Report Status PENDING   Incomplete   CULTURE, BLOOD (ROUTINE X 2)     Status: Normal (Preliminary result)   Collection Time   09/06/12  3:20 PM      Component Value Range Status Comment   Specimen Description BLOOD RIGHT ARM   Final    Special Requests BOTTLES DRAWN AEROBIC ONLY 10CC   Final    Culture  Setup Time 09/06/2012 21:08   Final    Culture     Final    Value:        BLOOD CULTURE RECEIVED NO GROWTH TO DATE CULTURE WILL BE HELD FOR 5 DAYS BEFORE ISSUING A FINAL NEGATIVE REPORT   Report  Status PENDING   Incomplete     Studies/Results: Mr Thoracic Spine Wo Contrast  09/05/2012  *RADIOLOGY REPORT*  Clinical Data: 61 year old female with pain at the right flank. Abnormal lower thoracic spine seen on recent lumbar MRI.  MRI THORACIC SPINE WITHOUT CONTRAST  Technique:  Multiplanar and multiecho pulse sequences of the thoracic spine were obtained without intravenous contrast.  Comparison: Lumbar MRI 09/04/2012.  Findings: Limited sagittal imaging the cervical spine suggests moderate to severe spondylosis at C4-C5 and C5-C6.  Diffuse spondylosis continues throughout the thoracic spine.  There is multifactorial spinal stenosis at T10-T11 and T11-T12 as suspected.  At the latter there is severe facet degeneration also with trace fluid in both facet joints.  However, the degree of spinal stenosis at these levels is not significantly different from that multiple additional thoracic levels, including T8-T9 (series 10 image 2), T7 and T8 (series 9  image 23) and others.  There is severe right side foraminal stenosis at T10-T11 which is multifactorial.  And this is more pronounced than the degenerative foraminal stenosis seen elsewhere in the thoracic spine.  There is focal prevertebral and right paraspinal inflammation as evidenced by abnormal increased STIR signal (series 8).  The configuration of the abnormal signal is unusual and seems to correspond to the course of the ventral and right intercostal vessels.  The discs at these levels appear degenerated, but without strong evidence of disc inflammation.  There is trace endplate edema at T 89, but this too appears degenerative.  There are large endplate osteophytes at this level, as at other levels in the thoracic spine.  No epidural or posterior paraspinal inflammation.  Despite the thoracic spinal stenosis the thoracic spinal cord signal is within normal limits.  The conus medullaris is re- identified at L1.  Previously seen small right pleural effusion is no longer evident. Abnormal signal in the spleen compatible with known splenic infarcts is again noted.  IMPRESSION: 1.  Abnormal right paraspinal/intercostal process centered at the T8 level. Legrand Rams this is the symptomatic abnormality, but the appearance is nonspecific.  It does not resemble a typical spinal infection.  Given the patient's hypercoagulable state and splenic infarcts, right consider whether this may be a thrombophlebitis of the intercostal vessels at this level.  I recommend a repeat thoracic MRI (without and with contrast preferred) in 10-14 days to further evaluate, and ensure no interval infectious changes in the adjacent spine.  2.  There is moderate to severe degenerative disease at the T10-T11 and T11-T12 levels as noted on the lumbar comparison, however, there is also advanced spondylosis elsewhere in the thoracic spine and also likely in the cervical spine.  I favor this is longstanding and not related to the acute presentation. 3.   Splenic infarct.  Recently seen small right pleural effusion no longer identified.  I discussed this case by telephone with Drs. [place] and [place] at the time of dictation. Dr. Sharl Ma advised me that this patient has been afebrile and without leukocytosis during this admission.   Original Report Authenticated By: Harley Hallmark, M.D.      Assessment/Plan: Splenic Infarct T8 paraspinal lesion/thrombophlebitis Previous PE 2012 ANA speckled 1:40  Await BCx Consider w/u for hyperthrombolic state Not sure how significant ANA is...  [name] Infectious Diseases [phone] 09/07/2012, 3:24 PM   LOS: 7 days

## 2012-09-07 NOTE — Progress Notes (Signed)
ANTICOAGULATION CONSULT NOTE - Follow Up Consult  Pharmacy Consult for Lovenox and Coumadin Indication: splenic infarct, hypercoagulable, possible intercostal thrombophlebitis  Allergies  Allergen Reactions  . Keflex (Cephalexin) Itching and Swelling    Lips and eyes swelled up  . Latex Itching  . Sulfa Antibiotics Rash  . Sulfur Rash    Patient Measurements: Height: 5' 8.9" (175 cm) Weight: 239 lb 3.2 oz (108.5 kg) IBW/kg (Calculated) : 65.97   Vital Signs: Temp: 98.2 F (36.8 C) (09/18 0548) Temp src: Oral (09/18 0548) BP: 117/65 mmHg (09/18 1014) Pulse Rate: 66  (09/18 0548)  Labs:  Basename 09/07/12 0555 09/06/12 0445 09/05/12 0721  HGB -- 13.7 --  HCT -- 40.9 --  PLT -- 374 --  APTT -- -- --  LABPROT 24.6* 22.3* 16.7*  INR 2.34* 1.92* 1.33  HEPARINUNFRC -- -- --  CREATININE -- -- --  CKTOTAL -- -- --  CKMB -- -- --  TROPONINI -- -- --    Estimated Creatinine Clearance: 81.1 ml/min (by C-G formula based on Cr of 0.93).  Assessment:   INR is now therapeutic. Day # 6 overlap with Lovenox. Has had Coumadin 7.5 mg daily x 3 (9/12-9/14), missed dose on 9/15, 10 mg given on 9/16, then 5 mg on 9/17.  Continues on Lovenox 110 mg SQ q12hrs and to continue until INR >2 x 24hrs.  CBC ok. Thoracic MRI done 9/16 as noted. No surgery planned for now. To repeat MRI in 10-14 days.  Goal of Therapy:  INR 2-3 Anti-Xa level 0.6-1.2 units/ml 4hrs after LMWH dose given Monitor platelets by anticoagulation protocol: Yes   Plan:   Repeat Coumadin 5 mg today.  Continue Lovenox 110 mg SQ q12hrs.  Daily PT/INR while in the hospital.  Discontinue Lovenox 9/19 if INR remains >2.   Dennie Fetters, RPh Pager: [phone] 09/07/2012,2:27 PM

## 2012-09-07 NOTE — Progress Notes (Signed)
Patient ID: Joy Butler, female   DOB: 1951/01/27, 61 y.o.   MRN: [id number]  [facility] PROGRESS NOTE  MAELEIGH BUSCHMAN MWU:[id number] DOB: 08/21/1951 DOA: 08/31/2012 PCP: Sheila Oats, MD  Brief narrative: Pt is 61 y/o female who was recently discharged from the hospital after the treatment for abdominal pain, all the w/u was negative, now came back to hospital and required an admission 08/31/2012 for further evaluation of the right side flank pain. Patient had CT abdomen /pelvis done in the ER which showed splenic infarct, which was not present on the previous CT abdomen done on 08/17/12. Patient has h/o PE which was diagnosed in 2011 and recently stopped taking coumadin in April due to financial reasons. 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  Abnormal right paraspinal/intercostal process centered at the T8 level. Legrand Rams this is the symptomatic abnormality, but the appearance is nonspecific.  It does not resemble a typical spinal infection.  Given the patient's hypercoagulable state and splenic infarcts, right consider whether this may be a  thrombophlebitis of the intercostal vessels at this level.  I recommend a repeat thoracic MRI (without and with contrast preferred) in 10-14 days to further evaluate, and ensure no interval infectious changes in the adjacent spine.   2.  There is moderate to severe degenerative disease at the T10-T11 and T11-T12 levels as noted on the lumbar comparison, however, there is also advanced spondylosis elsewhere in the thoracic spine and also likely in the cervical spine.  I favor this is longstanding and not related to the acute presentation.  3.  Splenic infarct.  Recently seen small right pleural effusion no longer identified.   Antibiotics:  None  Code Status: Full Family Communication: Pt at bedside Disposition Plan: Home when medically stable  HPI/Subjective: No events overnight.   Objective: Filed Vitals:   09/06/12 2234 09/07/12 0548 09/07/12 1014 09/07/12 1443  BP: 121/81 135/71 117/65 126/65  Pulse: 81 66  66  Temp: 98 F (36.7 C) 98.2 F (36.8 C)  98.2 F (36.8 C)  TempSrc: Oral Oral  Oral  Resp: 20 16  18   Height:      Weight:      SpO2: 97% 96%  96%    Intake/Output Summary (Last 24 hours) at 09/07/12 1508 Last data filed at 09/07/12 1444  Gross per 24 hour  Intake    930 ml  Output    500 ml  Net    430 ml    Exam:   General:  Pt is alert, follows commands appropriately, not in acute distress  Cardiovascular: Regular rate and rhythm, S1/S2, no murmurs, no rubs, no gallops  Respiratory: Clear to  auscultation bilaterally, no wheezing, no crackles, no rhonchi  Abdomen: Soft, non tender, non distended, bowel sounds present, no guarding  Extremities: No edema, pulses DP and PT palpable bilaterally  Neuro: Grossly nonfocal  Data Reviewed: Basic Metabolic Panel:  Lab 09/04/12 6213 09/02/12 0550  NA 136 140  K 3.9 4.0  CL 98 103  CO2 29 28  GLUCOSE 201* 173*  BUN 15 9  CREATININE 0.93 0.87  CALCIUM 9.6 9.2  MG 2.1 --  PHOS -- --   CBC:  Lab  09/06/12 0445 09/03/12 0710  WBC 7.1 9.6  NEUTROABS -- --  HGB 13.7 12.7  HCT 40.9 38.1  MCV 82.8 82.6  PLT 374 345    CBG:  Lab 09/07/12 1230 09/07/12 0747 09/06/12 2229 09/06/12 1720 09/06/12 1153  GLUCAP 187* 241* 191* 134* 140*    Recent Results (from the past 240 hour(s))  CULTURE, BLOOD (ROUTINE X 2)     Status: Normal (Preliminary result)   Collection Time   09/06/12  3:10 PM      Component Value Range Status Comment   Specimen Description BLOOD RIGHT ARM   Final    Special Requests BOTTLES DRAWN AEROBIC AND ANAEROBIC 10CC   Final    Culture  Setup Time 09/06/2012 21:06   Final    Culture     Final    Value:        BLOOD CULTURE RECEIVED NO GROWTH TO DATE CULTURE WILL BE HELD FOR 5 DAYS BEFORE ISSUING A FINAL NEGATIVE REPORT   Report Status PENDING   Incomplete   CULTURE, BLOOD (ROUTINE X 2)     Status: Normal (Preliminary result)   Collection Time   09/06/12  3:20 PM      Component Value Range Status Comment   Specimen Description BLOOD RIGHT ARM   Final    Special Requests BOTTLES DRAWN AEROBIC ONLY 10CC   Final    Culture  Setup Time 09/06/2012 21:08   Final    Culture     Final    Value:        BLOOD CULTURE RECEIVED NO GROWTH TO DATE CULTURE WILL BE HELD FOR 5 DAYS BEFORE ISSUING A FINAL NEGATIVE REPORT   Report Status PENDING   Incomplete      Scheduled Meds:   . amLODipine  10 mg Oral Daily  . enoxaparin injection  110 mg Subcutaneous Q12H  . hydrochlorothiazide  25 mg Oral Daily  . insulin aspart  0-15 Units Subcutaneous TID WC  . insulin glargine  15 Units Subcutaneous QHS  . losartan  100 mg Oral Daily  . methocarbamol  500 mg Oral TID  . metoCLOPramide  5 mg Oral TID AC & HS  . metoprolol  50 mg Oral BID  . pantoprazole  40 mg Oral Q1200  . warfarin  5 mg Oral ONCE-1800   Continuous Infusions:   . sodium chloride 10 mL/hr at 09/07/12 0500     Debbora Presto, MD  [facility] Pager [phone]  If 7PM-7AM, please contact  night-coverage [email or web address] Password [email or web address] 09/07/2012, 3:08 PM   LOS: 7 days

## 2012-09-07 NOTE — Progress Notes (Signed)
Subjective: Patient with mild aching in right flank, no complaints of back pain. MRI of the thoracic spine reviewed, and discussed with Dr. Augusto Gamble interpreting radiologist. He describes extensive spondylosis and degenerative disc disease through the cervical, thoracic, and lumbar spine without acute changes involving the spine. He also describes changes at the T8 right intercostal area that he suspects may well be a thrombophlebitis involving the intercostal vessels. He does not see any changes suggestive of spinal infection. The medical service is going to continue to evaluate and manage, and followup, on these changes. The suspected (on lumbar MRI) disc herniations at T10-11 and T11-12 were not confirmed by thoracic MRI , rather DDD and spondylitic changes were seen.  Objective: Vital signs in last 24 hours: Filed Vitals:   09/06/12 1000 09/06/12 1455 09/06/12 2234 09/07/12 0548  BP: 110/73 127/69 121/81 135/71  Pulse: 73 69 81 66  Temp:  98.1 F (36.7 C) 98 F (36.7 C) 98.2 F (36.8 C)  TempSrc:  Oral Oral Oral  Resp:  18 20 16   Height:      Weight:      SpO2:  96% 97% 96%    Intake/Output from previous day: 09/17 0701 - 09/18 0700 In: 930 [P.O.:840; I.V.:90] Out: 50 [Urine:50] Intake/Output this shift:    Physical Exam:  5 over 5 strength in lower extremities. Intact sensation to pinprick in lower extremities.  CBC  Basename 09/06/12 0445  WBC 7.1  HGB 13.7  HCT 40.9  PLT 374   BMET  Basename 09/04/12 1106  NA 136  K 3.9  CL 98  CO2 29  GLUCOSE 201*  BUN 15  CREATININE 0.93  CALCIUM 9.6    Studies/Results: Mr Thoracic Spine Wo Contrast  09/05/2012  *RADIOLOGY REPORT*  Clinical Data: 61 year old female with pain at the right flank. Abnormal lower thoracic spine seen on recent lumbar MRI.  MRI THORACIC SPINE WITHOUT CONTRAST  Technique:  Multiplanar and multiecho pulse sequences of the thoracic spine were obtained without intravenous contrast.  Comparison:  Lumbar MRI 09/04/2012.  Findings: Limited sagittal imaging the cervical spine suggests moderate to severe spondylosis at C4-C5 and C5-C6.  Diffuse spondylosis continues throughout the thoracic spine.  There is multifactorial spinal stenosis at T10-T11 and T11-T12 as suspected.  At the latter there is severe facet degeneration also with trace fluid in both facet joints.  However, the degree of spinal stenosis at these levels is not significantly different from that multiple additional thoracic levels, including T8-T9 (series 10 image 2), T7 and T8 (series 9 image 23) and others.  There is severe right side foraminal stenosis at T10-T11 which is multifactorial.  And this is more pronounced than the degenerative foraminal stenosis seen elsewhere in the thoracic spine.  There is focal prevertebral and right paraspinal inflammation as evidenced by abnormal increased STIR signal (series 8).  The configuration of the abnormal signal is unusual and seems to correspond to the course of the ventral and right intercostal vessels.  The discs at these levels appear degenerated, but without strong evidence of disc inflammation.  There is trace endplate edema at T 89, but this too appears degenerative.  There are large endplate osteophytes at this level, as at other levels in the thoracic spine.  No epidural or posterior paraspinal inflammation.  Despite the thoracic spinal stenosis the thoracic spinal cord signal is within normal limits.  The conus medullaris is re- identified at L1.  Previously seen small right pleural effusion is no longer evident.  Abnormal signal in the spleen compatible with known splenic infarcts is again noted.  IMPRESSION: 1.  Abnormal right paraspinal/intercostal process centered at the T8 level. Legrand Rams this is the symptomatic abnormality, but the appearance is nonspecific.  It does not resemble a typical spinal infection.  Given the patient's hypercoagulable state and splenic infarcts, right consider  whether this may be a thrombophlebitis of the intercostal vessels at this level.  I recommend a repeat thoracic MRI (without and with contrast preferred) in 10-14 days to further evaluate, and ensure no interval infectious changes in the adjacent spine.  2.  There is moderate to severe degenerative disease at the T10-T11 and T11-T12 levels as noted on the lumbar comparison, however, there is also advanced spondylosis elsewhere in the thoracic spine and also likely in the cervical spine.  I favor this is longstanding and not related to the acute presentation. 3.  Splenic infarct.  Recently seen small right pleural effusion no longer identified.  I discussed this case by telephone with Drs. [place] and Nudelman at the time of dictation. Dr. Sharl Ma advised me that this patient has been afebrile and without leukocytosis during this admission.   Original Report Authenticated By: Harley Hallmark, M.D.     Assessment/Plan: Patient with extensive cervical, thoracic, and lumbar degenerative disc disease and spondylosis, without acute changes involving the vertebra, discs, or spinal canal. I do not feel that there is any indication for neurosurgical intervention or followup at this time.  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 Patient has also been evaluated by Dr. Enedina Finner of the infectious disease service. Eventually this thromboembolic disease will need to be followed and managed by her outpatient primary physician.  Please reconsult if an acute neurosurgical issue arises.    Hewitt Shorts, MD 09/07/2012, 8:24 AM

## 2012-09-08 DIAGNOSIS — G8929 Other chronic pain: Secondary | ICD-10-CM | POA: Diagnosis not present

## 2012-09-08 DIAGNOSIS — R109 Unspecified abdominal pain: Secondary | ICD-10-CM | POA: Diagnosis not present

## 2012-09-08 DIAGNOSIS — E119 Type 2 diabetes mellitus without complications: Secondary | ICD-10-CM | POA: Diagnosis not present

## 2012-09-08 DIAGNOSIS — D7389 Other diseases of spleen: Secondary | ICD-10-CM | POA: Diagnosis not present

## 2012-09-08 DIAGNOSIS — M549 Dorsalgia, unspecified: Secondary | ICD-10-CM | POA: Diagnosis not present

## 2012-09-08 LAB — GLUCOSE, CAPILLARY
Glucose-Capillary: 214 mg/dL — ABNORMAL HIGH (ref 70–99)
Glucose-Capillary: 192 mg/dL — ABNORMAL HIGH (ref 70–99)

## 2012-09-08 LAB — BASIC METABOLIC PANEL
CO2: 26 mEq/L (ref 19–32)
Calcium: 9.4 mg/dL (ref 8.4–10.5)
Chloride: 101 mEq/L (ref 96–112)
GFR calc Af Amer: 69 mL/min — ABNORMAL LOW (ref >90)
Sodium: 136 mEq/L (ref 135–145)

## 2012-09-08 LAB — CBC
MCH: 28.1 pg (ref 26.0–34.0)
Platelets: 389 10*3/uL (ref 150–400)
RBC: 4.91 MIL/uL (ref 3.87–5.11)
RDW: 15.5 % (ref 11.5–15.5)
WBC: 7 10*3/uL (ref 4.0–10.5)

## 2012-09-08 MED ORDER — OXYCODONE-ACETAMINOPHEN 5-325 MG PO TABS
1.0000 | ORAL_TABLET | ORAL | Status: DC | PRN
  Administered 2012-09-08 – 2012-09-09 (×6): 1 via ORAL
  Filled 2012-09-08 (×6): qty 1

## 2012-09-08 MED ORDER — INSULIN GLARGINE 100 UNIT/ML ~~LOC~~ SOLN
20.0000 [IU] | Freq: Every day | SUBCUTANEOUS | Status: DC
  Administered 2012-09-08: 20 [IU] via SUBCUTANEOUS

## 2012-09-08 MED ORDER — WARFARIN SODIUM 7.5 MG PO TABS
7.5000 mg | ORAL_TABLET | Freq: Once | ORAL | Status: AC
  Administered 2012-09-08: 7.5 mg via ORAL
  Filled 2012-09-08: qty 1

## 2012-09-08 MED ORDER — LIVING WELL WITH DIABETES BOOK
Freq: Once | Status: DC
Start: 2012-09-08 — End: 2012-09-09
  Filled 2012-09-08 (×3): qty 1

## 2012-09-08 MED ORDER — HYDROMORPHONE HCL PF 1 MG/ML IJ SOLN: 1.0000 mg | INTRAMUSCULAR | Status: DC | PRN

## 2012-09-08 NOTE — Evaluation (Signed)
Physical Therapy Evaluation Patient Details Name: CREE NAPOLI MRN: [id number] DOB: 02-03-1951 Today's Date: 09/08/2012 Time: 4098-1191 PT Time Calculation (min): 25 min  PT Assessment / Plan / Recommendation Clinical Impression  Pt admitted with splenic infarct and T8 process thrombophlebitis. Pt very pleasant and eager to return home as she is supposed to be moving to a new house soon. Pt demonstrates decreased balance and strength and will benefit from acute therapy to maximize independence and function  prior to discharge.     PT Assessment  Patient needs continued PT services    Follow Up Recommendations  Outpatient PT    Barriers to Discharge None      Equipment Recommendations  3 in 1 bedside comode    Recommendations for Other Services     Frequency Min 3X/week    Precautions / Restrictions Precautions Precautions: Fall   Pertinent Vitals/Pain No pain, pt premedicated      Mobility  Bed Mobility Bed Mobility: Not assessed Transfers Sit to Stand: 6: Modified independent (Device/Increase time);From chair/3-in-1;From toilet Stand to Sit: 6: Modified independent (Device/Increase time);To toilet;To chair/3-in-1 Ambulation/Gait Ambulation/Gait Assistance: 5: Supervision Ambulation Distance (Feet): 1000 Feet Assistive device: None Ambulation/Gait Assistance Details: pt tending to grab handrail throughout hallway and educated for need to use cane at all times due to pt feeling unsteady with gait and multiple falls Gait Pattern: Step-through pattern;Decreased stride length Gait velocity: 17ft/17sec=1.76ft/sec Stairs: Yes Stairs Assistance: 4: Min guard (pt with near tripping up the step 1st step with cueing ) Stair Management Technique: One rail Right Number of Stairs: 3     Exercises     PT Diagnosis: Difficulty walking  PT Problem List: Decreased strength;Decreased knowledge of use of DME;Decreased balance PT Treatment Interventions: Gait training;DME  instruction;Functional mobility training;Therapeutic activities;Therapeutic exercise;Balance training;Patient/family education   PT Goals Acute Rehab PT Goals PT Goal Formulation: With patient Time For Goal Achievement: 09/15/12 Potential to Achieve Goals: Good Pt will go Supine/Side to Sit: with modified independence;with HOB 0 degrees PT Goal: Supine/Side to Sit - Progress: Goal set today Pt will go Sit to Supine/Side: with modified independence;with HOB 0 degrees PT Goal: Sit to Supine/Side - Progress: Goal set today PT Goal: Sit to Stand - Progress: Discontinued (comment) (from last admission) PT Goal: Stand to Sit - Progress: Discontinued (comment) (from last admission) PT Transfer Goal: Bed to Chair/Chair to Bed - Progress: Discontinued (comment) (from last admission) Pt will Ambulate: >150 feet;with modified independence;with least restrictive assistive device PT Goal: Ambulate - Progress: Goal set today Pt will Go Up / Down Stairs: 1-2 stairs;with rail(s);with modified independence PT Goal: Up/Down Stairs - Progress: Goal set today Additional Goals Additional Goal #1: Pt will complete Berg assessment to further evaluate balance deficits PT Goal: Additional Goal #1 - Progress: Goal set today  Visit Information  Last PT Received On: 09/08/12 Assistance Needed: +1    Subjective Data  Subjective: I would like to feel safe in the shower Patient Stated Goal: to not have back pain   Prior Functioning  Home Living Lives With: Spouse;Other (Comment) (grandson (16yo)) Available Help at Discharge: Family;Available 24 hours/day Type of Home: House Home Access: Stairs to enter [employer] of Steps: 3 Entrance Stairs-Rails: Right Home Layout: One level Bathroom Shower/Tub: Engineer, manufacturing systems: Standard Home Adaptive Equipment: Straight cane;Other (comment) (lift chair) Prior Function Level of Independence: Independent Able to Take Stairs?: Yes Driving:  No Vocation: On disability Communication Communication: No difficulties Dominant Hand: Right    Cognition  Overall Cognitive Status: Appears within functional limits for tasks assessed/performed Arousal/Alertness: Awake/alert Orientation Level: Appears intact for tasks assessed Behavior During Session: [facility] for tasks performed    Extremity/Trunk Assessment Right Lower Extremity Assessment RLE ROM/Strength/Tone: Deficits RLE ROM/Strength/Tone Deficits: hip flexion 3/5, knee extension 4/5, knee flexion 4+/5 RLE Sensation: WFL - Light Touch RLE Coordination: WFL - gross/fine motor Left Lower Extremity Assessment LLE ROM/Strength/Tone: Deficits LLE ROM/Strength/Tone Deficits: hip flexion 3/5, knee flexion and extension 4+/5 LLE Sensation: WFL - Light Touch LLE Coordination: WFL - gross/fine motor Trunk Assessment Trunk Assessment: Normal   Balance    End of Session PT - End of Session Activity Tolerance: Patient tolerated treatment well Patient left: in chair;with call bell/phone within reach Nurse Communication: Mobility status  GP     Delorse Lek 09/08/2012, 3:59 PM  Delaney Meigs, PT [phone]

## 2012-09-08 NOTE — Plan of Care (Signed)
Problem: Food- and Nutrition-Related Knowledge Deficit (NB-1.1) Goal: Nutrition education Formal process to instruct or train a patient/client in a skill or to impart knowledge to help patients/clients voluntarily manage or modify food choices and eating behavior to maintain or improve health.  Outcome: Completed/Met Date Met:  09/08/12  RD consulted for nutrition education regarding diabetes.     Lab Results  Component Value Date    HGBA1C 10.4* 08/31/2012    RD provided "Carbohydrate Counting for People with Diabetes" handout from the Academy of [employer]etics. Discussed different food groups and their effects on blood sugar, emphasizing carbohydrate-containing foods. Provided list of carbohydrates and recommended serving sizes of common foods.  Discussed importance of controlled and consistent carbohydrate intake throughout the day. Provided examples of ways to balance meals/snacks and encouraged intake of high-fiber, whole grain complex carbohydrates.  Pt with very limited knowledge of food and effect on blood sugars. Per notes pt had watched videos, pt was not able to tell me any foods that contained carbohydrates. Pt mostly eats carbohydrate foods, pasta, breads. Really encouraged pt to include protein into meals and try and make carbohydrate foods the side dish. Does not want to read food labels. Pt plans to attend nutrition and diabetes management center for additional nutrition education. That would be a more appropriate setting for education. At end of education pt was able to state some examples of foods she eats that are carbohydrate foods.   Expect poor compliance.  Body mass index is 35.43 kg/(m^2). Pt meets criteria for obesity class 2 based on current BMI.  Current diet order is low sodium, patient is consuming approximately 75-100% of meals at this time. Labs and medications reviewed. No further nutrition interventions warranted at this time. RD contact information  provided. If additional nutrition issues arise, please re-consult RD.  Clarene Duke RD, LDN Pager [phone] After Hours pager [phone]

## 2012-09-08 NOTE — Progress Notes (Signed)
ANTICOAGULATION CONSULT NOTE - Follow Up Consult  Pharmacy Consult for Lovenox and Coumadin Indication: splenic infarct, hypercoagulable, possible intercostal thrombophlebitis  Allergies  Allergen Reactions  . Keflex (Cephalexin) Itching and Swelling    Lips and eyes swelled up  . Latex Itching  . Sulfa Antibiotics Rash  . Sulfur Rash    Patient Measurements: Height: 5' 8.9" (175 cm) Weight: 239 lb 3.2 oz (108.5 kg) IBW/kg (Calculated) : 65.97   Vital Signs: Temp: 98.6 F (37 C) (09/19 0728) Temp src: Oral (09/19 0728) BP: 127/80 mmHg (09/19 1009) Pulse Rate: 73  (09/19 1009)  Labs:  Basename 09/08/12 0612 09/07/12 0555 09/06/12 0445  HGB 13.8 -- 13.7  HCT 41.2 -- 40.9  PLT 389 -- 374  APTT -- -- --  LABPROT 22.4* 24.6* 22.3*  INR 2.06* 2.34* 1.92*  HEPARINUNFRC -- -- --  CREATININE 0.99 -- --  CKTOTAL -- -- --  CKMB -- -- --  TROPONINI -- -- --    Estimated Creatinine Clearance: 76.2 ml/min (by C-G formula based on Cr of 0.99).  Assessment:   INR is now therapeutic. Day # 7 overlap with Lovenox. Has had Coumadin 7.5 mg daily x 3 (9/12-9/14), missed dose on 9/15, 10 mg given on 9/16, then 5 mg on 9/17 and 9/18.  INR >2 x 2 days, though has dropped some.  CBC ok.  Goal of Therapy:  INR 2-3 Anti-Xa level 0.6-1.2 units/ml 4hrs after LMWH dose given Monitor platelets by anticoagulation protocol: Yes   Plan:   Increase Coumadin to  7.5 mg today.  Discontinue Lovenox as INR >2 for 2 days.  Continue daily PT/INR.   Dennie Fetters, [place] Pager: [phone] 09/08/2012,12:32 PM

## 2012-09-08 NOTE — Progress Notes (Addendum)
Patient ID: Joy Butler, female   DOB: 1951/04/01, 61 y.o.   MRN: [id number]  [facility] PROGRESS NOTE  DAMETRIA TUZZOLINO WUJ:[id number] DOB: 01-15-1951 DOA: 08/31/2012 PCP: Sheila Oats, MD  Brief narrative:  Pt is 61 y/o female who was recently discharged from the hospital after the treatment for abdominal pain, all the w/u was negative, now came back to hospital and required an admission 08/31/2012 for further evaluation of the right side flank pain. Patient had CT abdomen /pelvis done in the ER which showed splenic infarct, which was not present on the previous CT abdomen done on 08/17/12. Patient has h/o PE which was diagnosed in 2011 and recently stopped taking coumadin in April due to financial reasons. 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   Active Problems:  Splenic infarction  - continue bridging Coumadin and Lovenox  - pt clinically stable and denies any concerns this AM  - will continue to provide supportive care with analgesia as needed for adequate pain control  - INR is therapeutic today so will likely be able to d/c Lovenox and continue Coumadin only   HTN (hypertension)  - stable during the hospital stay  - monitor vitals per floor protocol   Diabetes mellitus, with complications of gastroparesis   - CBG's slightly above the goal - will continue current medication regimen but will increase the dose of Lantus QHS  Personal history of PE (pulmonary embolism)  - will need lifelong anticoagulation  -  Chronic back pain  - secondary to 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 Abnormal right paraspinal/intercostal process centered at the T8 level. Legrand Rams this is the symptomatic  abnormality, but the appearance is nonspecific. It does not resemble a typical spinal infection. Given the patient's hypercoagulable state and splenic infarcts, right consider whether this may be a thrombophlebitis of the intercostal vessels at this level. I recommend a repeat thoracic MRI (without and with contrast preferred) in 10-14 days to further evaluate, and ensure no interval infectious changes in the adjacent spine.  2. There is moderate to severe degenerative disease at the T10-T11 and T11-T12 levels as noted on the lumbar comparison, however, there is also advanced spondylosis elsewhere in the thoracic spine and also likely in the cervical spine. I favor this is longstanding and not related to the acute presentation.  3. Splenic infarct. Recently seen small right pleural effusion no longer identified.   Antibiotics:  None  Code Status: Full  Family Communication: Pt at bedside  Disposition Plan: Home when medically stable, anticipate d/c in AM   HPI/Subjective: No events overnight.   Objective: Filed Vitals:   09/07/12 1443 09/07/12 2231 09/08/12 0728 09/08/12 1009  BP: 126/65 113/69 116/72 127/80  Pulse: 66 92 70 73  Temp: 98.2 F (36.8 C) 98.5 F (36.9 C) 98.6 F (37 C)   TempSrc: Oral Oral Oral   Resp: 18 18 18    Height:      Weight:      SpO2: 96% 99% 97%     Intake/Output Summary (Last 24 hours) at 09/08/12 1015 Last data filed at 09/07/12 1444  Gross per 24 hour  Intake    480 ml  Output    500 ml  Net    -20 ml    Exam:   General:  Pt is alert, follows commands appropriately, not in acute distress  Cardiovascular: Regular rate and rhythm, S1/S2, no murmurs, no rubs, no gallops  Respiratory: Clear to auscultation bilaterally, no wheezing, no crackles, no rhonchi  Abdomen: Soft, tender mostly right side of the abdominal area, non distended, bowel sounds present, no guarding  Extremities: No edema, pulses DP and PT palpable bilaterally  Neuro: Grossly  nonfocal  Data Reviewed: Basic Metabolic Panel:  Lab 09/08/12 1478 09/04/12 1106 09/02/12 0550  NA 136 136 140  K 3.9 3.9 4.0  CL 101 98 103  CO2 26 29 28   GLUCOSE 215* 201* 173*  BUN 17 15 9   CREATININE 0.99 0.93 0.87  CALCIUM 9.4 9.6 9.2  MG -- 2.1 --  PHOS -- -- --   CBC:  Lab 09/08/12 0612 09/06/12 0445 09/03/12 0710  WBC 7.0 7.1 9.6  NEUTROABS -- -- --  HGB 13.8 13.7 12.7  HCT 41.2 40.9 38.1  MCV 83.9 82.8 82.6  PLT 389 374 345   CBG:  Lab 09/08/12 0755 09/07/12 2233 09/07/12 1639 09/07/12 1230 09/07/12 0747  GLUCAP 199* 239* 184* 187* 241*    Recent Results (from the past 240 hour(s))  CULTURE, BLOOD (ROUTINE X 2)     Status: Normal (Preliminary result)   Collection Time   09/06/12  3:10 PM      Component Value Range Status Comment   Specimen Description BLOOD RIGHT ARM   Final    Special Requests BOTTLES DRAWN AEROBIC AND ANAEROBIC 10CC   Final    Culture  Setup Time 09/06/2012 21:06   Final    Culture     Final    Value:        BLOOD CULTURE RECEIVED NO GROWTH TO DATE CULTURE WILL BE HELD FOR 5 DAYS BEFORE ISSUING A FINAL NEGATIVE REPORT   Report Status PENDING   Incomplete   CULTURE, BLOOD (ROUTINE X 2)     Status: Normal (Preliminary result)   Collection Time   09/06/12  3:20 PM      Component Value Range Status Comment   Specimen Description BLOOD RIGHT ARM   Final    Special Requests BOTTLES DRAWN AEROBIC ONLY 10CC   Final    Culture  Setup Time 09/06/2012 21:08   Final    Culture     Final    Value:        BLOOD CULTURE RECEIVED NO GROWTH TO DATE CULTURE WILL BE HELD FOR 5 DAYS BEFORE ISSUING A FINAL NEGATIVE REPORT   Report Status PENDING   Incomplete      Scheduled Meds:   . amLODipine  10 mg Oral Daily  . enoxaparin (LOVENOX) injection  110 mg Subcutaneous Q12H  . enoxaparin   Does not apply Once  . hydrochlorothiazide  25 mg Oral Daily  . insulin aspart  0-15 Units Subcutaneous TID WC  . insulin glargine  15 Units Subcutaneous QHS  .  losartan  100 mg Oral Daily  . methocarbamol  500 mg Oral TID  . metoCLOPramide  5 mg Oral TID AC & HS  . metoprolol  50 mg Oral BID  . pantoprazole  40 mg Oral Q1200  . sodium chloride  3 mL Intravenous Q12H  . warfarin  5 mg Oral ONCE-1800  . Warfarin - Pharmacist Dosing Inpatient   Does not apply q1800   Continuous Infusions:   . sodium chloride  10 mL/hr at 09/07/12 0500     Debbora Presto, MD  [facility] Pager [phone]  If 7PM-7AM, please contact night-coverage [email or web address] Password [email or web address] 09/08/2012, 10:15 AM   LOS: 8 days

## 2012-09-08 NOTE — Progress Notes (Signed)
09/08/12  Spoke with patient about her diabetes.  States that she has had diabetes for "a long time".  Has moved here recently and will be moving into a new house soon.  Still needs a PCP here.  Would like to attend outpatient DM education.  States that husband cooks at home and would benefit from attending classes with the wife.  Suggested that she watch DM videos while in hospital.  Will continue to follow while in hospital. Smith Mince RN BSN CDE

## 2012-09-08 NOTE — Progress Notes (Signed)
INFECTIOUS DISEASE PROGRESS NOTE  ID: Joy Butler is a 61 y.o. female with   Active Problems:  HTN (hypertension)  Diabetes mellitus  Personal history of PE (pulmonary embolism)  Chronic back pain  Splenic infarction  Subjective: Cont back pain.  Abtx:  Anti-infectives    None      Medications:  Scheduled:   . amLODipine  10 mg Oral Daily  . hydrochlorothiazide  25 mg Oral Daily  . insulin aspart  0-15 Units Subcutaneous TID WC  . insulin glargine  20 Units Subcutaneous QHS  . losartan  100 mg Oral Daily  . methocarbamol  500 mg Oral TID  . metoCLOPramide  5 mg Oral TID AC & HS  . metoprolol  50 mg Oral BID  . pantoprazole  40 mg Oral Q1200  . sodium chloride  3 mL Intravenous Q12H  . warfarin  5 mg Oral ONCE-1800  . warfarin  7.5 mg Oral ONCE-1800  . Warfarin - Pharmacist Dosing Inpatient   Does not apply q1800  . DISCONTD: enoxaparin (LOVENOX) injection  110 mg Subcutaneous Q12H  . DISCONTD: enoxaparin   Does not apply Once  . DISCONTD: insulin glargine  15 Units Subcutaneous QHS    Objective: Vital signs in last 24 hours: Temp:  [98.2 F (36.8 C)-98.6 F (37 C)] 98.2 F (36.8 C) (09/19 1316) Pulse Rate:  [66-92] 71  (09/19 1316) Resp:  [18] 18  (09/19 1316) BP: (104-127)/(65-80) 104/68 mmHg (09/19 1316) SpO2:  [96 %-99 %] 97 % (09/19 1316)   Back: tenderness R lower back, lumbar level Resp: clear to auscultation bilaterally Cardio: regular rate and rhythm GI: normal findings: bowel sounds normal and soft, non-tender  Lab Results  Basename 09/08/12 0612 09/06/12 0445  WBC 7.0 7.1  HGB 13.8 13.7  HCT 41.2 40.9  NA 136 --  K 3.9 --  CL 101 --  CO2 26 --  BUN 17 --  CREATININE 0.99 --  GLU -- --   Liver Panel No results found for this basename: PROT:2,ALBUMIN:2,AST:2,ALT:2,ALKPHOS:2,BILITOT:2,BILIDIR:2,IBILI:2 in the last 72 hours Sedimentation Rate  Basename 09/06/12 1521  ESRSEDRATE 65*   C-Reactive Protein No results found for  this basename: CRP:2 in the last 72 hours  Microbiology: Recent Results (from the past 240 hour(s))  CULTURE, BLOOD (ROUTINE X 2)     Status: Normal (Preliminary result)   Collection Time   09/06/12  3:10 PM      Component Value Range Status Comment   Specimen Description BLOOD RIGHT ARM   Final    Special Requests BOTTLES DRAWN AEROBIC AND ANAEROBIC 10CC   Final    Culture  Setup Time 09/06/2012 21:06   Final    Culture     Final    Value:        BLOOD CULTURE RECEIVED NO GROWTH TO DATE CULTURE WILL BE HELD FOR 5 DAYS BEFORE ISSUING A FINAL NEGATIVE REPORT   Report Status PENDING   Incomplete   CULTURE, BLOOD (ROUTINE X 2)     Status: Normal (Preliminary result)   Collection Time   09/06/12  3:20 PM      Component Value Range Status Comment   Specimen Description BLOOD RIGHT ARM   Final    Special Requests BOTTLES DRAWN AEROBIC ONLY 10CC   Final    Culture  Setup Time 09/06/2012 21:08   Final    Culture     Final    Value:  BLOOD CULTURE RECEIVED NO GROWTH TO DATE CULTURE WILL BE HELD FOR 5 DAYS BEFORE ISSUING A FINAL NEGATIVE REPORT   Report Status PENDING   Incomplete     Studies/Results: No results found.   Assessment/Plan:  Splenic Infarct  T8 paraspinal lesion/thrombophlebitis  Previous PE 2012  ANA speckled 1:40  BCx are NGTD.  Consider w/u for hyperthrombolic state  Not sure how significant ANA is... Please let me know if any change in her status (fever, change in BCx), will be glad to f/u thanks  [name] Infectious Diseases [phone] 09/08/2012, 2:12 PM   LOS: 8 days

## 2012-09-09 DIAGNOSIS — E119 Type 2 diabetes mellitus without complications: Secondary | ICD-10-CM | POA: Diagnosis not present

## 2012-09-09 DIAGNOSIS — M549 Dorsalgia, unspecified: Secondary | ICD-10-CM | POA: Diagnosis not present

## 2012-09-09 DIAGNOSIS — G8929 Other chronic pain: Secondary | ICD-10-CM | POA: Diagnosis not present

## 2012-09-09 DIAGNOSIS — R109 Unspecified abdominal pain: Secondary | ICD-10-CM | POA: Diagnosis not present

## 2012-09-09 LAB — GLUCOSE, CAPILLARY
Glucose-Capillary: 184 mg/dL — ABNORMAL HIGH (ref 70–99)
Glucose-Capillary: 270 mg/dL — ABNORMAL HIGH (ref 70–99)

## 2012-09-09 MED ORDER — DOXYCYCLINE HYCLATE 100 MG PO TABS: 100.0000 mg | ORAL_TABLET | Freq: Two times a day (BID) | ORAL | Status: DC

## 2012-09-09 MED ORDER — OXYCODONE-ACETAMINOPHEN 5-325 MG PO TABS: 1.0000 | ORAL_TABLET | ORAL | Status: DC | PRN

## 2012-09-09 MED ORDER — INSULIN GLARGINE 100 UNIT/ML ~~LOC~~ SOLN: 20.0000 [IU] | Freq: Every day | SUBCUTANEOUS | Status: DC

## 2012-09-09 MED ORDER — METOPROLOL TARTRATE 50 MG PO TABS: 50.0000 mg | ORAL_TABLET | Freq: Two times a day (BID) | ORAL | Status: DC

## 2012-09-09 MED ORDER — LOSARTAN POTASSIUM-HCTZ 100-25 MG PO TABS: 1.0000 | ORAL_TABLET | Freq: Every day | ORAL | Status: DC

## 2012-09-09 MED ORDER — WARFARIN SODIUM 7.5 MG PO TABS: 7.5000 mg | ORAL_TABLET | Freq: Every day | ORAL | Status: DC

## 2012-09-09 MED ORDER — METHOCARBAMOL 500 MG PO TABS: 500.0000 mg | ORAL_TABLET | Freq: Three times a day (TID) | ORAL | Status: DC

## 2012-09-09 MED ORDER — AMLODIPINE BESYLATE 10 MG PO TABS: 10.0000 mg | ORAL_TABLET | Freq: Every day | ORAL | Status: DC

## 2012-09-09 MED ORDER — METOCLOPRAMIDE HCL 5 MG PO TABS: 5.0000 mg | ORAL_TABLET | Freq: Three times a day (TID) | ORAL | Status: DC

## 2012-09-09 NOTE — Progress Notes (Signed)
Joy Butler [id number] Discharge Data: 09/09/2012 5:03 PM Attending Provider: No att. providers found WUJ:[id number], MD     Sheliah Plane to be D/C'd Home per MD order.  Discussed with the patient the After Visit Summary and all questions fully answered.  All IV's discontinued with no bleeding noted. All belongings returned to patient for patient to take home.   Last Vital Signs:  Blood pressure 123/57, pulse 74, temperature 98.6 F (37 C), temperature source Oral, resp. rate 18, height 5' 8.9" (1.75 m), weight 108.5 kg (239 lb 3.2 oz), SpO2 100.00%.  Discharge Medication List   Medication List     As of 09/09/2012  5:03 PM    STOP taking these medications         loperamide 2 MG capsule   Commonly known as: IMODIUM      oxyCODONE 5 MG immediate release tablet   Commonly known as: Oxy IR/ROXICODONE      predniSONE 20 MG tablet   Commonly known as: DELTASONE      TAKE these medications         amLODipine 10 MG tablet   Commonly known as: NORVASC   Take 1 tablet (10 mg total) by mouth daily.      doxycycline 100 MG tablet   Commonly known as: VIBRA-TABS   Take 1 tablet (100 mg total) by mouth 2 (two) times daily.      insulin glargine 100 UNIT/ML injection   Commonly known as: LANTUS   Inject 20 Units into the skin at bedtime.      losartan-hydrochlorothiazide 100-25 MG per tablet   Commonly known as: HYZAAR   Take 1 tablet by mouth daily.      methocarbamol 500 MG tablet   Commonly known as: ROBAXIN   Take 1 tablet (500 mg total) by mouth 3 (three) times daily.      metoCLOPramide 5 MG tablet   Commonly known as: REGLAN   Take 1 tablet (5 mg total) by mouth 4 (four) times daily -  before meals and at bedtime.      metoprolol 50 MG tablet   Commonly known as: LOPRESSOR   Take 1 tablet (50 mg total) by mouth 2 (two) times daily.      omeprazole 20 MG capsule   Commonly known as: PRILOSEC   Take 1 capsule (20 mg total) by mouth daily.     oxyCODONE-acetaminophen 5-325 MG per tablet   Commonly known as: PERCOCET/ROXICET   Take 1-2 tablets by mouth every 4 (four) hours as needed.      warfarin 7.5 MG tablet   Commonly known as: COUMADIN   Take 1 tablet (7.5 mg total) by mouth daily.        Rosalie Doctor, RN

## 2012-09-09 NOTE — Discharge Summary (Signed)
Physician Discharge Summary  Joy Butler UXL:[id number] DOB: [birth year] DOA: 08/31/2012  PCP: Sheila Oats, MD  Admit date: 08/31/2012 Discharge date: 09/09/2012  Recommendations for Outpatient Follow-up:  1. Pt will need to follow up with PCP in 2-3 weeks post discharge 2. Please obtain BMP to evaluate electrolytes and kidney function 3. Please also check CBC to evaluate Hg and Hct levels 4. Please note that ANA test was ordered and came back positive but no further evaluation done for autoimmune disease 5. Also coagulopathy work recommended by surgery team  Discharge Diagnoses: Right side flank pain due to splenic infarction Active Problems:  HTN (hypertension)  Diabetes mellitus  Personal history of PE (pulmonary embolism)  Chronic back pain  Splenic infarction   Discharge Condition: Stable  Diet recommendation: Heart healthy diet discussed in details   Brief narrative:  Pt is 61 y/o female who was recently discharged from the hospital after the treatment for abdominal pain, all the w/u was negative, now came back to hospital and required an admission 08/31/2012 for further evaluation of the right side flank pain. Patient had CT abdomen /pelvis done in the ER which showed splenic infarct, which was not present on the previous CT abdomen done on 08/17/12. Patient has h/o PE which was diagnosed in 2011 and recently stopped taking coumadin in April due to financial reasons. 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   Active Problems:  Splenic infarction  - continued bridging Coumadin and Lovenox  - pt clinically stable and denies any concerns this AM  - continued to provide supportive care with analgesia as needed for adequate pain control  - INR is therapeutic today so will d/c Lovenox and continue Coumadin only   HTN (hypertension)  - stable during the hospital stay  - monitored vitals per floor protocol    Diabetes mellitus, with complications of gastroparesis  - CBG's slightly above the goal  - continued current medication regimen which is home medication regimen  Personal history of PE (pulmonary embolism)  - will need lifelong anticoagulation   Chronic back pain  - secondary to 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 Abnormal right paraspinal/intercostal process centered at the T8 level. Legrand Rams this is the symptomatic abnormality, but the appearance is nonspecific. It does not resemble a typical spinal infection. Given the patient's hypercoagulable state and splenic infarcts, right consider whether this may be a thrombophlebitis of the intercostal vessels at this level. I recommend a repeat thoracic MRI (without and with contrast preferred) in 10-14 days to further evaluate, and ensure no interval infectious changes in the adjacent spine.  2. There is moderate to severe degenerative disease at the T10-T11 and T11-T12 levels as noted on the lumbar comparison, however, there is also advanced spondylosis elsewhere in the thoracic spine and also likely in the cervical spine. I favor this is longstanding and not related to the acute presentation.  3. Splenic infarct. Recently seen small right pleural effusion no longer identified.   Antibiotics:  None  Discharge Exam: Filed Vitals:   09/09/12 1002  BP: 133/58  Pulse: 76  Temp:   Resp:    Filed Vitals:   09/08/12 1316 09/08/12 2125 09/09/12 0610 09/09/12 1002  BP: 104/68 157/92  146/80 133/58  Pulse: 71 85 70 76  Temp: 98.2 F (36.8 C) 98.2 F (36.8 C) 97.7 F (36.5 C)   TempSrc: Oral Oral Oral   Resp: 18 16 16    Height:      Weight:      SpO2: 97% 97% 100%     General: Pt is alert, follows commands  appropriately, not in acute distress Cardiovascular: Regular rate and rhythm, S1/S2 +, no murmurs, no rubs, no gallops Respiratory: Clear to auscultation bilaterally, no wheezing, no crackles, no rhonchi Abdominal: Soft, non tender, non distended, bowel sounds +, no guarding Extremities: no edema, no cyanosis, pulses palpable bilaterally DP and PT Neuro: Grossly nonfocal  Discharge Instructions  Discharge Orders    Future Orders Please Complete By Expires   Ambulatory referral to Nutrition and Diabetic Education      Comments:   Is requesting general diabetes education and diet counseling.  Takes Lantus and Novolog at home.   Diet - low sodium heart healthy      Increase activity slowly          Medication List     As of 09/09/2012 11:18 AM    STOP taking these medications         loperamide 2 MG capsule   Commonly known as: IMODIUM      oxyCODONE 5 MG immediate release tablet   Commonly known as: Oxy IR/ROXICODONE      predniSONE 20 MG tablet   Commonly known as: DELTASONE      TAKE these medications         amLODipine 10 MG tablet   Commonly known as: NORVASC   Take 1 tablet (10 mg total) by mouth daily.      doxycycline 100 MG tablet   Commonly known as: VIBRA-TABS   Take 1 tablet (100 mg total) by mouth 2 (two) times daily.      insulin glargine 100 UNIT/ML injection   Commonly known as: LANTUS   Inject 20 Units into the skin at bedtime.      losartan-hydrochlorothiazide 100-25 MG per tablet   Commonly known as: HYZAAR   Take 1 tablet by mouth daily.      methocarbamol 500 MG tablet   Commonly known as: ROBAXIN   Take 1 tablet (500 mg total) by mouth 3 (three) times daily.      metoCLOPramide 5 MG tablet   Commonly known as: REGLAN   Take 1 tablet (5 mg total) by mouth 4 (four) times daily -  before meals and at bedtime.      metoprolol 50 MG tablet   Commonly known as: LOPRESSOR   Take 1 tablet (50 mg total) by mouth 2 (two) times daily.       omeprazole 20 MG capsule   Commonly known as: PRILOSEC   Take 1 capsule (20 mg total) by mouth daily.      oxyCODONE-acetaminophen 5-325 MG per tablet   Commonly known as: PERCOCET/ROXICET   Take 1-2 tablets by mouth every 4 (four) hours as needed.      warfarin 7.5 MG tablet   Commonly known as: COUMADIN   Take 1 tablet (7.5 mg total) by mouth daily.           Follow-up Information    Follow up with [facility], MD. On 09/13/2012. Event organiser Card for [employer] eat or drink after midnight appointment time is 2pm Sep 24)    Contact information:   [address] [place]  [address] [phone]           The results of significant diagnostics from this hospitalization (including imaging, microbiology, ancillary and laboratory) are listed below for reference.     Microbiology: Recent Results (from the past 240 hour(s))  CULTURE, BLOOD (ROUTINE X 2)     Status: Normal (Preliminary result)   Collection Time   09/06/12  3:10 PM      Component Value Range Status Comment   Specimen Description BLOOD RIGHT ARM   Final    Special Requests BOTTLES DRAWN AEROBIC AND ANAEROBIC 10CC   Final    Culture  Setup Time 09/06/2012 21:06   Final    Culture     Final    Value:        BLOOD CULTURE RECEIVED NO GROWTH TO DATE CULTURE WILL BE HELD FOR 5 DAYS BEFORE ISSUING A FINAL NEGATIVE REPORT   Report Status PENDING   Incomplete   CULTURE, BLOOD (ROUTINE X 2)     Status: Normal (Preliminary result)   Collection Time   09/06/12  3:20 PM      Component Value Range Status Comment   Specimen Description BLOOD RIGHT ARM   Final    Special Requests BOTTLES DRAWN AEROBIC ONLY 10CC   Final    Culture  Setup Time 09/06/2012 21:08   Final    Culture     Final    Value:        BLOOD CULTURE RECEIVED NO GROWTH TO DATE CULTURE WILL BE HELD FOR 5 DAYS BEFORE ISSUING A FINAL NEGATIVE REPORT   Report Status PENDING   Incomplete      Labs: Basic Metabolic Panel:  Lab 09/08/12 9147 09/04/12 1106    NA 136 136  K 3.9 3.9  CL 101 98  CO2 26 29  GLUCOSE 215* 201*  BUN 17 15  CREATININE 0.99 0.93  CALCIUM 9.4 9.6  MG -- 2.1  PHOS -- --   Liver Function Tests: No results found for this basename: AST:5,ALT:5,ALKPHOS:5,BILITOT:5,PROT:5,ALBUMIN:5 in the last 168 hours No results found for this basename: LIPASE:5,AMYLASE:5 in the last 168 hours No results found for this basename: AMMONIA:5 in the last 168 hours CBC:  Lab 09/08/12 0612 09/06/12 0445 09/03/12 0710  WBC 7.0 7.1 9.6  NEUTROABS -- -- --  HGB 13.8 13.7 12.7  HCT 41.2 40.9 38.1  MCV 83.9 82.8 82.6  PLT 389 374 345   Cardiac Enzymes: No results found for this basename: CKTOTAL:5,CKMB:5,CKMBINDEX:5,TROPONINI:5 in the last 168 hours BNP: BNP (last 3 results) No results found for this basename: PROBNP:3 in the last 8760 hours CBG:  Lab 09/09/12 0743 09/08/12 2212 09/08/12 1706 09/08/12 1221 09/08/12 0755  GLUCAP 184* 192* 182* 214* 199*     SIGNED: Time coordinating discharge: Over 30 minutes  Debbora Presto, MD  [facility] 09/09/2012, 11:18 AM Pager [phone]  If 7PM-7AM, please contact night-coverage [email or web address] Password [email or web address]

## 2012-09-12 LAB — CULTURE, BLOOD (ROUTINE X 2)

## 2012-09-14 ENCOUNTER — Emergency Department (HOSPITAL_COMMUNITY)
Admission: EM | Admit: 2012-09-14 | Discharge: 2012-09-14 | Disposition: A | Discharge status: Home / Self Care | Payer: MEDICARE | Attending: Emergency Medicine | Admitting: Emergency Medicine

## 2012-09-14 ENCOUNTER — Encounter (HOSPITAL_COMMUNITY): Payer: Self-pay | Admitting: Emergency Medicine

## 2012-09-14 DIAGNOSIS — I10 Essential (primary) hypertension: Secondary | ICD-10-CM | POA: Insufficient documentation

## 2012-09-14 DIAGNOSIS — Z882 Allergy status to sulfonamides status: Secondary | ICD-10-CM | POA: Insufficient documentation

## 2012-09-14 DIAGNOSIS — Z91040 Latex allergy status: Secondary | ICD-10-CM | POA: Insufficient documentation

## 2012-09-14 DIAGNOSIS — R7301 Impaired fasting glucose: Secondary | ICD-10-CM | POA: Diagnosis not present

## 2012-09-14 DIAGNOSIS — Z881 Allergy status to other antibiotic agents status: Secondary | ICD-10-CM | POA: Insufficient documentation

## 2012-09-14 DIAGNOSIS — E119 Type 2 diabetes mellitus without complications: Secondary | ICD-10-CM | POA: Insufficient documentation

## 2012-09-14 DIAGNOSIS — K5641 Fecal impaction: Secondary | ICD-10-CM

## 2012-09-14 DIAGNOSIS — F17200 Nicotine dependence, unspecified, uncomplicated: Secondary | ICD-10-CM | POA: Insufficient documentation

## 2012-09-14 DIAGNOSIS — R109 Unspecified abdominal pain: Secondary | ICD-10-CM | POA: Diagnosis not present

## 2012-09-14 DIAGNOSIS — K5900 Constipation, unspecified: Secondary | ICD-10-CM | POA: Diagnosis not present

## 2012-09-14 DIAGNOSIS — Z794 Long term (current) use of insulin: Secondary | ICD-10-CM | POA: Insufficient documentation

## 2012-09-14 MED ORDER — POLYETHYLENE GLYCOL 3350 17 GM/SCOOP PO POWD: 17.0000 g | Freq: Every day | ORAL | Status: DC

## 2012-09-14 MED ORDER — MAGNESIUM CITRATE PO SOLN: 296.0000 mL | Freq: Once | ORAL | Status: DC

## 2012-09-14 NOTE — ED Provider Notes (Addendum)
History     CSN: [id number]  Arrival date & time 09/14/12  1647   First MD Initiated Contact with Patient 09/14/12 1656      Chief Complaint  Patient presents with  . Abdominal Pain    (Consider location/radiation/quality/duration/timing/severity/associated sxs/prior treatment) Patient is a 61 y.o. female presenting with abdominal pain. The history is provided by the patient.  Abdominal Pain The primary symptoms of the illness include abdominal pain. Primary symptoms comment: rectal pain Episode onset: worsening over the last few days, however tried an enema today and unable to get the stool out. The onset of the illness was gradual.  The patient has had a change in bowel habit. Additional symptoms associated with the illness include constipation. Associated medical issues comments: recent hospitlization for splenic infarction.    Past Medical History  Diagnosis Date  . Diabetes mellitus   . Hypertension   . Pulmonary embolism   . UTI (urinary tract infection)     Past Surgical History  Procedure Date  . Abdominal hysterectomy   . Cholecystectomy   . Esophagogastroduodenoscopy 08/19/2012    Procedure: ESOPHAGOGASTRODUODENOSCOPY (EGD);  Surgeon: Vertell Novak., MD;  Location: [facility] ENDOSCOPY;  Service: Endoscopy;  Laterality: N/A;    No family history on file.  History  Substance Use Topics  . Smoking status: Current Every Day Smoker -- 0.2 packs/day for 10 years    Types: Cigarettes  . Smokeless tobacco: Not on file  . Alcohol Use: No    OB History    Grav Para Term Preterm Abortions TAB SAB Ect Mult Living                  Review of Systems  Gastrointestinal: Positive for abdominal pain and constipation.  All other systems reviewed and are negative.    Allergies  Keflex; Latex; Sulfa antibiotics; and Sulfur  Home Medications   Current Outpatient Rx  Name Route Sig Dispense Refill  . AMLODIPINE BESYLATE 10 MG PO TABS Oral Take 1 tablet (10 mg total)  by mouth daily. 30 tablet 2  . DOXYCYCLINE HYCLATE 100 MG PO TABS Oral Take 1 tablet (100 mg total) by mouth 2 (two) times daily. 14 tablet 0  . INSULIN GLARGINE 100 UNIT/ML [place] SOLN Subcutaneous Inject 20 Units into the skin at bedtime. 10 mL 2  . LOSARTAN POTASSIUM-HCTZ 100-25 MG PO TABS Oral Take 1 tablet by mouth daily. 30 tablet 2  . METHOCARBAMOL 500 MG PO TABS Oral Take 1 tablet (500 mg total) by mouth 3 (three) times daily. 45 tablet 0  . METOCLOPRAMIDE HCL 5 MG PO TABS Oral Take 1 tablet (5 mg total) by mouth 4 (four) times daily -  before meals and at bedtime. 120 tablet 2  . METOCLOPRAMIDE HCL 5 MG PO TABS Oral Take 1 tablet (5 mg total) by mouth 4 (four) times daily -  before meals and at bedtime. 90 tablet 2  . METOPROLOL TARTRATE 50 MG PO TABS Oral Take 1 tablet (50 mg total) by mouth 2 (two) times daily. 60 tablet 2  . OMEPRAZOLE 20 MG PO CPDR Oral Take 1 capsule (20 mg total) by mouth daily. 30 capsule 0  . OXYCODONE-ACETAMINOPHEN 5-325 MG PO TABS Oral Take 1-2 tablets by mouth every 4 (four) hours as needed. 65 tablet 0  . WARFARIN SODIUM 7.5 MG PO TABS Oral Take 1 tablet (7.5 mg total) by mouth daily. 30 tablet 0    BP 139/77  Pulse 85  Temp  97.7 F (36.5 C) (Oral)  Resp 20  SpO2 100%  Physical Exam  Nursing note and vitals reviewed. Constitutional: She is oriented to person, place, and time. She appears well-developed and well-nourished. She appears distressed.  HENT:  Head: Normocephalic and atraumatic.  Mouth/Throat: Oropharynx is clear and moist.  Eyes: Conjunctivae normal and EOM are normal. Pupils are equal, round, and reactive to light.  Neck: Normal range of motion. Neck supple.  Cardiovascular: Normal rate, regular rhythm and intact distal pulses.   No murmur heard. Pulmonary/Chest: Effort normal and breath sounds normal. No respiratory distress. She has no wheezes. She has no rales.  Abdominal: Soft. She exhibits no distension. There is no tenderness. There  is no rebound and no guarding.  Genitourinary: Rectal exam shows external hemorrhoid.       Large stool ball present at the rectum  Musculoskeletal: Normal range of motion. She exhibits no edema and no tenderness.  Neurological: She is alert and oriented to person, place, and time.  Skin: Skin is warm and dry. No rash noted. No erythema.  Psychiatric: She has a normal mood and affect. Her behavior is normal.    ED Course  Procedures (including critical care time)  Labs Reviewed - No data to display No results found.   1. Fecal impaction       MDM   Patient presenting with a fecal impaction with severe discomfort in her rectal area. She was recently hospitalized for a splenic infarct and left the hospital 5 days ago. She states she has not had a bowel movement in greater than 5 days and is unable to pass it despite trying to use an enema at home.  Patient was manually disimpacted with a large amount of stool produced. She was then placed on the bed side toilet to see if she was able to pass the rest. Will go back and reevaluate once patient has been able to calm down.  6:14 PM On re-eval pt is feeling much better.  No further stool present.  Pt started on miralax and d/ced.      Gwyneth Sprout, MD 09/14/12 1610  Gwyneth Sprout, MD 09/14/12 [id number]

## 2012-09-14 NOTE — ED Notes (Signed)
Patient did not have BM for 4 days nausea and emesis one day ago with LLQ and RLQ pain.  In hospital for 3 weeks recently for "spleen problems"  Called EMS ax4 pain 10/10 sharp and diaphoretic.

## 2012-09-14 NOTE — ED Notes (Signed)
Pt states that she is still in pain and feels that she "has to go" but "pushes and pushes and nothing comes out."

## 2012-09-30 ENCOUNTER — Ambulatory Visit: Payer: Medicare Other | Attending: Internal Medicine | Admitting: Physical Therapy

## 2012-09-30 ENCOUNTER — Ambulatory Visit: Payer: Medicare Other | Admitting: Dietician

## 2012-10-11 ENCOUNTER — Emergency Department (INDEPENDENT_AMBULATORY_CARE_PROVIDER_SITE_OTHER)
Admission: EM | Admit: 2012-10-11 | Discharge: 2012-10-11 | Disposition: A | Discharge status: Nursing Home (Includes ICF) | Payer: Medicare Other | Source: Home / Self Care | Attending: Family Medicine | Admitting: Family Medicine

## 2012-10-11 ENCOUNTER — Emergency Department (HOSPITAL_COMMUNITY): Payer: Medicare Other

## 2012-10-11 ENCOUNTER — Inpatient Hospital Stay (HOSPITAL_COMMUNITY)
Admission: EM | Admit: 2012-10-11 | Discharge: 2012-10-17 | DRG: 176 | Disposition: A | Discharge status: Home / Self Care | Payer: Medicare Other | Attending: Internal Medicine | Admitting: Internal Medicine

## 2012-10-11 ENCOUNTER — Encounter (HOSPITAL_COMMUNITY): Payer: Self-pay

## 2012-10-11 ENCOUNTER — Encounter (HOSPITAL_COMMUNITY): Payer: Self-pay | Admitting: Emergency Medicine

## 2012-10-11 DIAGNOSIS — E7800 Pure hypercholesterolemia, unspecified: Secondary | ICD-10-CM | POA: Diagnosis present

## 2012-10-11 DIAGNOSIS — T7840XA Allergy, unspecified, initial encounter: Secondary | ICD-10-CM

## 2012-10-11 DIAGNOSIS — M549 Dorsalgia, unspecified: Secondary | ICD-10-CM | POA: Diagnosis not present

## 2012-10-11 DIAGNOSIS — G8929 Other chronic pain: Secondary | ICD-10-CM | POA: Diagnosis not present

## 2012-10-11 DIAGNOSIS — E46 Unspecified protein-calorie malnutrition: Secondary | ICD-10-CM | POA: Diagnosis present

## 2012-10-11 DIAGNOSIS — R937 Abnormal findings on diagnostic imaging of other parts of musculoskeletal system: Secondary | ICD-10-CM

## 2012-10-11 DIAGNOSIS — E1143 Type 2 diabetes mellitus with diabetic autonomic (poly)neuropathy: Secondary | ICD-10-CM

## 2012-10-11 DIAGNOSIS — F17200 Nicotine dependence, unspecified, uncomplicated: Secondary | ICD-10-CM | POA: Diagnosis present

## 2012-10-11 DIAGNOSIS — I10 Essential (primary) hypertension: Secondary | ICD-10-CM | POA: Diagnosis not present

## 2012-10-11 DIAGNOSIS — R079 Chest pain, unspecified: Secondary | ICD-10-CM | POA: Diagnosis not present

## 2012-10-11 DIAGNOSIS — D735 Infarction of spleen: Secondary | ICD-10-CM

## 2012-10-11 DIAGNOSIS — E119 Type 2 diabetes mellitus without complications: Secondary | ICD-10-CM

## 2012-10-11 DIAGNOSIS — G609 Hereditary and idiopathic neuropathy, unspecified: Secondary | ICD-10-CM | POA: Diagnosis present

## 2012-10-11 DIAGNOSIS — J984 Other disorders of lung: Secondary | ICD-10-CM | POA: Diagnosis not present

## 2012-10-11 DIAGNOSIS — Z86711 Personal history of pulmonary embolism: Secondary | ICD-10-CM

## 2012-10-11 DIAGNOSIS — K3184 Gastroparesis: Secondary | ICD-10-CM

## 2012-10-11 DIAGNOSIS — E1142 Type 2 diabetes mellitus with diabetic polyneuropathy: Secondary | ICD-10-CM

## 2012-10-11 DIAGNOSIS — I2699 Other pulmonary embolism without acute cor pulmonale: Principal | ICD-10-CM

## 2012-10-11 DIAGNOSIS — D72829 Elevated white blood cell count, unspecified: Secondary | ICD-10-CM | POA: Diagnosis present

## 2012-10-11 HISTORY — DX: Pure hypercholesterolemia, unspecified: E78.00

## 2012-10-11 LAB — COMPREHENSIVE METABOLIC PANEL
ALT: <5 U/L (ref 0–35)
Albumin: 2.5 g/dL — ABNORMAL LOW (ref 3.5–5.2)
Alkaline Phosphatase: 126 U/L — ABNORMAL HIGH (ref 39–117)
BUN: 10 mg/dL (ref 6–23)
Calcium: 9.2 mg/dL (ref 8.4–10.5)
Potassium: 3.8 mEq/L (ref 3.5–5.1)
Sodium: 138 mEq/L (ref 135–145)
Total Protein: 7.1 g/dL (ref 6.0–8.3)

## 2012-10-11 LAB — CBC
MCHC: 34.2 g/dL (ref 30.0–36.0)
RDW: 15 % (ref 11.5–15.5)

## 2012-10-11 LAB — POCT I-STAT, CHEM 8
Calcium, Ion: 1.21 mmol/L (ref 1.13–1.30)
Chloride: 105 mEq/L (ref 96–112)
Glucose, Bld: 194 mg/dL — ABNORMAL HIGH (ref 70–99)
HCT: 43 % (ref 36.0–46.0)
TCO2: 25 mmol/L (ref 0–100)

## 2012-10-11 LAB — PRO B NATRIURETIC PEPTIDE: Pro B Natriuretic peptide (BNP): 630.6 pg/mL — ABNORMAL HIGH (ref 0–125)

## 2012-10-11 LAB — TROPONIN I: Troponin I: <0.3 ng/mL (ref <0.30)

## 2012-10-11 LAB — PROTIME-INR: Prothrombin Time: 13 seconds (ref 11.6–15.2)

## 2012-10-11 MED ORDER — ONDANSETRON HCL 4 MG PO TABS: 4.0000 mg | ORAL_TABLET | Freq: Four times a day (QID) | ORAL | Status: DC | PRN

## 2012-10-11 MED ORDER — KETOROLAC TROMETHAMINE 30 MG/ML IJ SOLN
30.0000 mg | Freq: Once | INTRAMUSCULAR | Status: AC
  Administered 2012-10-11: 30 mg via INTRAVENOUS
  Filled 2012-10-11: qty 1

## 2012-10-11 MED ORDER — INSULIN GLARGINE 100 UNIT/ML ~~LOC~~ SOLN
10.0000 [IU] | Freq: Every day | SUBCUTANEOUS | Status: DC
  Administered 2012-10-12: 10 [IU] via SUBCUTANEOUS

## 2012-10-11 MED ORDER — SODIUM CHLORIDE 0.9 % IV SOLN
Freq: Once | INTRAVENOUS | Status: AC
  Administered 2012-10-11: 14:00:00 via INTRAVENOUS

## 2012-10-11 MED ORDER — MORPHINE SULFATE 2 MG/ML IJ SOLN
INTRAMUSCULAR | Status: AC
  Administered 2012-10-11: 2 mg via INTRAVENOUS
  Filled 2012-10-11: qty 1

## 2012-10-11 MED ORDER — AMLODIPINE BESYLATE 10 MG PO TABS: 10.0000 mg | ORAL_TABLET | Freq: Every day | ORAL | Status: DC

## 2012-10-11 MED ORDER — SODIUM CHLORIDE 0.9 % IV SOLN: 20.0000 mL | INTRAVENOUS | Status: DC

## 2012-10-11 MED ORDER — MORPHINE SULFATE 4 MG/ML IJ SOLN
4.0000 mg | Freq: Once | INTRAMUSCULAR | Status: AC
  Administered 2012-10-11: 4 mg via INTRAVENOUS
  Filled 2012-10-11: qty 1

## 2012-10-11 MED ORDER — MORPHINE SULFATE 2 MG/ML IJ SOLN
2.0000 mg | INTRAMUSCULAR | Status: DC | PRN
  Administered 2012-10-11: 2 mg via INTRAVENOUS
  Administered 2012-10-12: 4 mg via INTRAVENOUS
  Administered 2012-10-12 – 2012-10-13 (×2): 2 mg via INTRAVENOUS
  Filled 2012-10-11: qty 2
  Filled 2012-10-11: qty 1
  Filled 2012-10-11: qty 2

## 2012-10-11 MED ORDER — ENOXAPARIN SODIUM 40 MG/0.4ML ~~LOC~~ SOLN
40.0000 mg | SUBCUTANEOUS | Status: DC
  Administered 2012-10-12: 40 mg via SUBCUTANEOUS
  Filled 2012-10-11: qty 0.4

## 2012-10-11 MED ORDER — LOSARTAN POTASSIUM 50 MG PO TABS: 100.0000 mg | ORAL_TABLET | Freq: Every day | ORAL | Status: DC

## 2012-10-11 MED ORDER — ASPIRIN EC 81 MG PO TBEC
81.0000 mg | DELAYED_RELEASE_TABLET | Freq: Every day | ORAL | Status: DC
  Administered 2012-10-12 – 2012-10-17 (×6): 81 mg via ORAL
  Filled 2012-10-11 (×6): qty 1

## 2012-10-11 MED ORDER — INSULIN ASPART 100 UNIT/ML ~~LOC~~ SOLN
0.0000 [IU] | Freq: Three times a day (TID) | SUBCUTANEOUS | Status: DC
  Administered 2012-10-12: 5 [IU] via SUBCUTANEOUS
  Administered 2012-10-12 – 2012-10-14 (×7): 3 [IU] via SUBCUTANEOUS
  Administered 2012-10-14: 2 [IU] via SUBCUTANEOUS
  Administered 2012-10-15 (×2): 3 [IU] via SUBCUTANEOUS
  Administered 2012-10-15: 2 [IU] via SUBCUTANEOUS
  Administered 2012-10-16: 3 [IU] via SUBCUTANEOUS
  Administered 2012-10-16: 2 [IU] via SUBCUTANEOUS
  Administered 2012-10-17 (×2): 3 [IU] via SUBCUTANEOUS

## 2012-10-11 MED ORDER — NITROGLYCERIN 0.4 MG SL SUBL: 0.4000 mg | SUBLINGUAL_TABLET | SUBLINGUAL | Status: DC | PRN

## 2012-10-11 MED ORDER — PANTOPRAZOLE SODIUM 40 MG PO TBEC
40.0000 mg | DELAYED_RELEASE_TABLET | Freq: Every day | ORAL | Status: DC
  Administered 2012-10-12 – 2012-10-17 (×6): 40 mg via ORAL
  Filled 2012-10-11 (×5): qty 1

## 2012-10-11 MED ORDER — ONDANSETRON HCL 4 MG/2ML IJ SOLN
4.0000 mg | Freq: Four times a day (QID) | INTRAMUSCULAR | Status: DC | PRN
  Filled 2012-10-11: qty 2

## 2012-10-11 MED ORDER — SODIUM CHLORIDE 0.9 % IV SOLN
INTRAVENOUS | Status: AC
  Administered 2012-10-11: 22:00:00 via INTRAVENOUS

## 2012-10-11 MED ORDER — SODIUM CHLORIDE 0.9 % IJ SOLN
3.0000 mL | Freq: Two times a day (BID) | INTRAMUSCULAR | Status: DC
  Administered 2012-10-12 – 2012-10-16 (×7): 3 mL via INTRAVENOUS

## 2012-10-11 MED ORDER — LOSARTAN POTASSIUM 50 MG PO TABS
100.0000 mg | ORAL_TABLET | Freq: Every day | ORAL | Status: DC
  Administered 2012-10-12 – 2012-10-17 (×7): 100 mg via ORAL
  Filled 2012-10-11 (×7): qty 2

## 2012-10-11 MED ORDER — HYDROMORPHONE HCL PF 1 MG/ML IJ SOLN: 0.5000 mg | INTRAMUSCULAR | Status: DC | PRN

## 2012-10-11 MED ORDER — HYDRALAZINE HCL 20 MG/ML IJ SOLN
10.0000 mg | INTRAMUSCULAR | Status: AC
  Administered 2012-10-11: 10 mg via INTRAVENOUS
  Filled 2012-10-11: qty 1

## 2012-10-11 MED ORDER — AMLODIPINE BESYLATE 10 MG PO TABS
10.0000 mg | ORAL_TABLET | Freq: Every day | ORAL | Status: DC
  Administered 2012-10-12 – 2012-10-17 (×7): 10 mg via ORAL
  Filled 2012-10-11 (×7): qty 1

## 2012-10-11 MED ORDER — ASPIRIN 81 MG PO CHEW
324.0000 mg | CHEWABLE_TABLET | Freq: Once | ORAL | Status: AC
  Administered 2012-10-11: 324 mg via ORAL
  Filled 2012-10-11: qty 4

## 2012-10-11 MED ORDER — POLYETHYLENE GLYCOL 3350 17 GM/SCOOP PO POWD
17.0000 g | Freq: Every day | ORAL | Status: DC
  Filled 2012-10-11 (×4): qty 255

## 2012-10-11 MED ORDER — OXYCODONE HCL 5 MG PO TABS
5.0000 mg | ORAL_TABLET | ORAL | Status: DC | PRN
  Administered 2012-10-12 – 2012-10-13 (×6): 5 mg via ORAL
  Filled 2012-10-11 (×6): qty 1

## 2012-10-11 NOTE — H&P (Signed)
Hospital Admission Note Date: 10/11/2012  Patient name: Joy Butler Medical record number: [id number] Date of birth: Aug 08, 1951 Age: 61 y.o. Gender: female PCP: None  Medical Service: Internal Medicine Teaching Service  Attending physician:  Dr. Lars Mage    1st Contact: Dr. Darden Palmer  Pager: [phone] 2nd Contact: Dr. Almyra Deforest  Pager: [phone] After 5 pm or weekends: 1st Contact:      Pager: [phone] 2nd Contact:      Pager: [phone]  Chief Complaint:  Chest pain  History of Present Illness:  Joy Butler is a 61 year old woman with a PMH significant for Diabetes, hypertension, PE in 2011, and recent splenic infarction who presents to the [facility] ED complaining of left sided chest pain that started the evening prior to admission.  She states that the pain is in her left side moving under the left breast and to the substernal area.  She has never had anything like this before.  She was watching TV when it started.  She states that the pain is an ache that does not get worse or better when she wakes.  She was mildly nauseated last night but that resolved after about 20 minutes and this morning she had one episode of cold chills but denies fevers, cough, vomiting, shortness of breath, abdominal pain, diarrhea, or constipation.  She has had some belching and a sour taste in the back of her throat for the last few weeks.    She states that she has been out of her medications for about 2 weeks.  Her fiance and herself moved to [facility] in August of 2013 and just found a place to live.  They are on a fixed income and she has been unable to afford her medications.  She states that she has Medicare and [employer] for her insurance.  She does not have a PCP and has also not been on her coumadin since she was discharged from the hospital in September 2013.   Meds: Current Outpatient Rx  Name Route Sig Dispense Refill  . AMLODIPINE BESYLATE 10 MG PO TABS Oral Take 1 tablet (10  mg total) by mouth daily. 30 tablet 2  . HYDROCHLOROTHIAZIDE 25 MG PO TABS Oral Take 25 mg by mouth daily. Take with losartan    . INSULIN GLARGINE 100 UNIT/ML [place] SOLN Subcutaneous Inject 15 Units into the skin at bedtime.    Marland Kitchen LOSARTAN POTASSIUM 50 MG PO TABS Oral Take 100 mg by mouth daily. Take with hydrochlorothiazide    . METHOCARBAMOL 500 MG PO TABS Oral Take 1 tablet (500 mg total) by mouth 3 (three) times daily. 45 tablet 0  . OXYCODONE-ACETAMINOPHEN 5-325 MG PO TABS Oral Take 1-2 tablets by mouth every 4 (four) hours as needed. For pain    . POLYETHYLENE GLYCOL 3350 PO POWD Oral Take 17 g by mouth daily. 255 g 0  . DOXYCYCLINE HYCLATE 100 MG PO TABS Oral Take 1 tablet (100 mg total) by mouth 2 (two) times daily. 14 tablet 0   Allergies: Allergies as of 10/11/2012 - Review Complete 10/11/2012  Allergen Reaction Noted  . Keflex (cephalexin) Itching and Swelling 08/19/2012  . Latex Itching 09/01/2012  . Sulfa antibiotics Rash 08/16/2012  . Sulfur Rash 08/16/2012   Past Medical History  Diagnosis Date  . Diabetes mellitus     diagnosed in 2000.    Marland Kitchen Hypertension   . Pulmonary embolism     2011 treated at [facility] in  [place].  Was on Coumadin for over a year.  no known family history  . UTI (urinary tract infection)   . High cholesterol    Past Surgical History  Procedure Date  . Abdominal hysterectomy   . Cholecystectomy     1980  . Esophagogastroduodenoscopy 08/19/2012    Procedure: ESOPHAGOGASTRODUODENOSCOPY (EGD);  Surgeon: Vertell Novak., MD;  Location: [facility] ENDOSCOPY;  Service: Endoscopy;  Laterality: N/A;   Family History  Problem Relation Age of Onset  . Adopted: Yes  . Family history unknown: Yes   History   Social History  . Marital Status: Single    Spouse Name: N/A    Number of Children: N/A  . Years of Education: N/A   Occupational History  . Not on file.   Social History Main Topics  . Smoking status: Current Every Day Smoker -- 0.2 packs/day  for 10 years    Types: Cigarettes  . Smokeless tobacco: Never Used  . Alcohol Use: No  . Drug Use: No  . Sexually Active: Not Currently   Other Topics Concern  . Not on file   Social History Narrative   Previously divorced now engaged with her partner of 4 years.  Has 6 grown children with 21 grandchildren.  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  Moved to [place] in August 2013 and was transiently homeless until first part of October 2013.     Review of Systems: Constitutional: Denies fever, chills, diaphoresis, appetite change and fatigue.  HEENT: Denies photophobia, eye pain, redness, hearing loss, ear pain, congestion, sore throat, rhinorrhea, sneezing, mouth sores, trouble swallowing, neck pain, neck stiffness and tinnitus.   Respiratory: Denies SOB, DOE, cough, chest tightness,  and wheezing.   Cardiovascular: Positive for  chest pain. Denies palpitations and leg swelling.  Gastrointestinal: Denies nausea, vomiting, abdominal pain, diarrhea, constipation, blood in stool and abdominal distention.  Genitourinary: Denies dysuria, urgency, frequency, hematuria, flank pain and difficulty urinating.  Musculoskeletal: Denies myalgias, back pain, joint swelling, arthralgias and gait problem.  Skin: Denies pallor, rash and wound.  Neurological: Denies dizziness, seizures, syncope, weakness, light-headedness, numbness and headaches.  Hematological: Denies adenopathy. Easy bruising, personal or family bleeding history  Psychiatric/Behavioral: Denies suicidal ideation, mood changes, confusion, nervousness, sleep disturbance and agitation  Physical Exam: Blood pressure 176/95, pulse 74, temperature 98.4 F (36.9 C), temperature source Oral, resp. rate 17, SpO2 94.00%. Constitutional: Vital signs reviewed.  Patient is a well-developed and well-nourished morbidly obese woman in no acute distress and cooperative with exam. Alert and oriented x3.  Head:  Normocephalic and atraumatic Ear: TM normal bilaterally Mouth: no erythema or exudates, MMM Eyes: PERRL, EOMI, conjunctivae normal, No scleral icterus.  Neck: Supple, Trachea midline normal ROM, No JVD, mass, thyromegaly, or carotid bruit present.  Cardiovascular: RRR, S1 normal, S2 normal, no MRG, pulses symmetric and intact bilaterally, no pain to palpation over the chest wall.  There is discoloration beneath the left breast but no moisture or odor.  Pulmonary/Chest: CTAB, no wheezes, rales, or rhonchi Abdominal: There is darkening of the skin over the left side of the abdomen that the patient states is unchanged for many years.  Surgical scar from open Cholecystectomy noted, hysterectomy scar noted. Soft. Non-tender, non-distended, bowel sounds are normal, no masses, organomegaly, or guarding present.  GU: no CVA tenderness Musculoskeletal: No joint deformities, erythema, or stiffness, ROM full and no nontender Hematology: no cervical, inginal, or axillary adenopathy.  Neurological: A&O x3,  Strength is normal and symmetric bilaterally, cranial nerve II-XII are grossly intact, no focal motor deficit, sensory intact to light touch bilaterally.  Skin: Warm, dry and intact. No rash, cyanosis, or clubbing.  Psychiatric: Normal mood and affect. speech and behavior is normal. Judgment and thought content normal. Cognition and memory are normal.   Lab results: Basic Metabolic Panel:  Basename 10/11/12 1604 10/11/12 1325  NA 138 143  K 3.8 3.6  CL 102 105  CO2 26 --  GLUCOSE 160* 194*  BUN 10 8  CREATININE 0.79 0.90  CALCIUM 9.2 --  MG -- --  PHOS -- --   Liver Function Tests:  [facility] 10/11/12 1604  AST 11  ALT <5  ALKPHOS 126*  BILITOT 0.3  PROT 7.1  ALBUMIN 2.5*   CBC:  Basename 10/11/12 1604 10/11/12 1325  WBC 10.9* --  NEUTROABS -- --  HGB 13.8 14.6  HCT 40.3 43.0  MCV 82.2 --  PLT 364 --   Cardiac Enzymes:  Basename 10/11/12 1941  CKTOTAL --  CKMB --  CKMBINDEX  --  TROPONINI <0.30   BNP:  Basename 10/11/12 1535  PROBNP 630.6*   Coagulation:  Basename 10/11/12 1604  LABPROT 13.0  INR 0.99   Imaging results:  Dg Chest 2 View  10/11/2012  *RADIOLOGY REPORT*  Clinical Data: Chest pain.  CHEST - 2 VIEW  Comparison: September 04, 2012.  Findings: Cardiomediastinal silhouette appears normal.  Left lung is clear.  Linear density is noted in right midlung which is unchanged compared to prior exam and most consistent with scarring. Bony thorax is intact.  IMPRESSION: No acute cardiopulmonary abnormality seen.  Right midlung scarring is again noted.   Original Report Authenticated By: Venita Sheffield., M.D.    Other results: EKG: Sinus rhythm, rate 80 bpm, left anterior fascicular block, poor R wave progression across the precordial leads.   Assessment & Plan by Problem: 1. Chest pain: Ms. Jarriel is a 61 year old woman with a PMH significant for Diabetes, hypertension, PE, and recent Splenic infarct who presents because of left sided aching chest pain for the last 24 hours.  Differential diagnosis includes ACS, PE, Aortic dissection, pneumonia, GERD, shingles, anxiety, musculoskeletal chest pain, or complications from her splenic infarct.  She has no past EKG to compare her new one with but does have some poor R wave progression but her chest pain had resolved by the time she was evaluated by our service.  Initial troponin is negative.  She has no documented CAD and per her report has never had a catheterization or stress test to her knowledge.  Her risk factors include smoking, hypertension, and diabetes.  She does not know her family history and we have no record of her lipid panel in our system.  She has a history of PE and her modified Geneva score is 6 which is intermediate probability with points for a previous PE and her pulse rate >75.  She is afebrile with no WBC count and her chest x-ray is clear of infiltrates which makes PNA unlikely.  Her pain is  not reproducible on exam and does not change with movement or breathing.  She has a recent splenic infarct in 08/2012 which appeared to be stable over her hospitalization then.  She has not been on Coumadin and was discharged on it but never followed up because she could not afford the medication.    - Admit to telemetry  - Cycle Trop and repeat EKG in  AM  - CT angiogram to rule out PE and to evaluate the spleen for possible hematoma or extension of her infarct.  - FLP  - HgBA1C in 08/2012 was 10.4%  - Oxycodone and morphine for pain  - Protonix 40 mg PO daily.    2. Diabetes Mellitius: The patient is managed on Lantus 15 U but she has been out of all of her medications for at least 2 weeks.  She states that when she is in the hospital her sugars are managed well but when she goes home she still fries her food and she thinks that is driving her blood sugar up.    - Restart Lantus at 10 U, May need to increase to home dose of 15 U  - SSI-M  3. History of PE: She has a history of PE treated at [facility] in [place] in 2011.  She was maintained on coumadin for about a year and a half but recently has stopped it because she was unable to afford the medication.  She has had a splenic infarct but the splenic vein was noted to be patent.  No read on the splenic artery.  She has one proven VTE episode which would should be treated with 6-12 months of anticoagulation.  There is no definite evidence of thrombosis causing the splenic infarct.    - CTA for PE with Geneva score of 6  - Will ask radiology to try to go a bit lower to get the entire spleen to check for extension of the splenic infarct or sequela from that.   4.  HTN: She is hypertensive today and states that she has been out of her medications for at least 2 weeks.  We will restart her Losartan, Amlodipine but will hold her HCTZ for now.   5. Leukocytosis:  She has a mild leukocytosis.  Her chest x-ray is clear and she has no fever.  We will check  a UA and repeat her CBC with diff in the AM.    6.  Low material resources:  She has been intermittently homeless over the last two months since moving to [facility] but recently found a place to live but per her report it did not come with appliances which they are saving to try to afford.  She has not been able to afford her medications on her fixed income but has [employer] to help her pay for her medications.    - Consult CSW to see if there are any resources that we can tap into to help her afford her medications.  7. VTE:  Lovenox (proph doses for now)  Signed: Gaylyn Berish 10/11/2012, 9:27 PM

## 2012-10-11 NOTE — ED Notes (Signed)
BP on the higher side.Informed Dr.

## 2012-10-11 NOTE — ED Notes (Signed)
Report to angelia, rn-[facility]

## 2012-10-11 NOTE — Progress Notes (Signed)
At 22:15, patient arrived to nursing unit complaining of 8/10 left sided chest pain radiating to back and upper arm. No nausea, diaphoresis or SOB. Characterized as a "cramping" pain. Deferred NTG SL related to no effect earlier this evening. Stated morphine worked "for a while." BP 172/98 via manual cuff on right upper arm, patient 3/4 supine on left side. Morphine 2mg  IV given with good relief of symptoms at 15 minutes. BP was elevated in ED prior to admission; patient has been non-adherent to home medication regimen related to financial difficulty and recent move. Social work has been consulted..  Will follow-up BP in one hour and notify MD if still elevated. Continuing to monitor closely.

## 2012-10-11 NOTE — ED Notes (Addendum)
MD at bedside. 

## 2012-10-11 NOTE — ED Provider Notes (Signed)
History     CSN: [id number]  Arrival date & time 10/11/12  1428   First MD Initiated Contact with Patient 10/11/12 1503      Chief Complaint  Patient presents with  . Chest Pain    (Consider location/radiation/quality/duration/timing/severity/associated sxs/prior treatment) HPI The patient presents with concerns of left-sided chest pain.  The pain actually began approximately 18 hours ago.  Since onset the pain has been persistent in the left breast, and left shoulder.  The pain is constant, moderate, dull.  No clear alleviating or exacerbating factors.  The patient also includes nausea as a complaint.  She denies any fever, cough, dyspnea.  She also denies any syncope. The patient has a history of diabetes, hypertension, PE.  She does not take anticoagulants due to financial constraints.  The patient presented to an urgent care center, and given her description, her comorbidities, she was refer here for further evaluation and management. Past Medical History  Diagnosis Date  . Diabetes mellitus   . Hypertension   . Pulmonary embolism   . UTI (urinary tract infection)   . High cholesterol     Past Surgical History  Procedure Date  . Abdominal hysterectomy   . Cholecystectomy   . Esophagogastroduodenoscopy 08/19/2012    Procedure: ESOPHAGOGASTRODUODENOSCOPY (EGD);  Surgeon: Vertell Novak., MD;  Location: [facility] ENDOSCOPY;  Service: Endoscopy;  Laterality: N/A;    No family history on file.  History  Substance Use Topics  . Smoking status: Current Every Day Smoker -- 0.2 packs/day for 10 years    Types: Cigarettes  . Smokeless tobacco: Not on file  . Alcohol Use: No    OB History    Grav Para Term Preterm Abortions TAB SAB Ect Mult Living                  Review of Systems  Constitutional:       Per HPI, otherwise negative  HENT:       Per HPI, otherwise negative  Eyes: Negative.   Respiratory:       Per HPI, otherwise negative  Cardiovascular:       Per  HPI, otherwise negative  Gastrointestinal: Positive for nausea.  Genitourinary: Negative.   Musculoskeletal:       Per HPI, otherwise negative  Skin: Negative.   Neurological: Negative for syncope.    Allergies  Keflex; Latex; Sulfa antibiotics; and Sulfur  Home Medications   Current Outpatient Rx  Name Route Sig Dispense Refill  . AMLODIPINE BESYLATE 10 MG PO TABS Oral Take 1 tablet (10 mg total) by mouth daily. 30 tablet 2  . HYDROCHLOROTHIAZIDE 25 MG PO TABS Oral Take 25 mg by mouth daily. Take with losartan    . INSULIN GLARGINE 100 UNIT/ML [place] SOLN Subcutaneous Inject 15 Units into the skin at bedtime.    Marland Kitchen LOSARTAN POTASSIUM 50 MG PO TABS Oral Take 100 mg by mouth daily. Take with hydrochlorothiazide    . METHOCARBAMOL 500 MG PO TABS Oral Take 1 tablet (500 mg total) by mouth 3 (three) times daily. 45 tablet 0  . OXYCODONE-ACETAMINOPHEN 5-325 MG PO TABS Oral Take 1-2 tablets by mouth every 4 (four) hours as needed. For pain    . POLYETHYLENE GLYCOL 3350 PO POWD Oral Take 17 g by mouth daily. 255 g 0  . DOXYCYCLINE HYCLATE 100 MG PO TABS Oral Take 1 tablet (100 mg total) by mouth 2 (two) times daily. 14 tablet 0    BP 174/91  Pulse 77  Temp 98.4 F (36.9 C) (Oral)  Resp 16  SpO2 95%  Physical Exam  Nursing note and vitals reviewed. Constitutional: She is oriented to person, place, and time. She appears well-developed and well-nourished. No distress.  HENT:  Head: Normocephalic and atraumatic.  Eyes: Conjunctivae normal and EOM are normal.  Cardiovascular: Normal rate and regular rhythm.   Pulmonary/Chest: Effort normal and breath sounds normal. No stridor. No respiratory distress.  Abdominal: She exhibits no distension.  Musculoskeletal: She exhibits no edema and no tenderness.  Neurological: She is alert and oriented to person, place, and time. No cranial nerve deficit.  Skin: Skin is warm and dry.  Psychiatric: She has a normal mood and affect.    ED Course    Procedures (including critical care time)  Labs Reviewed  PRO B NATRIURETIC PEPTIDE - Abnormal; Notable for the following:    Pro B Natriuretic peptide (BNP) 630.6 (*)     All other components within normal limits  CBC - Abnormal; Notable for the following:    WBC 10.9 (*)     All other components within normal limits  COMPREHENSIVE METABOLIC PANEL - Abnormal; Notable for the following:    Glucose, Bld 160 (*)     Albumin 2.5 (*)     Alkaline Phosphatase 126 (*)     GFR calc non Af Amer 87 (*)     All other components within normal limits  PROTIME-INR   Dg Chest 2 View  10/11/2012  *RADIOLOGY REPORT*  Clinical Data: Chest pain.  CHEST - 2 VIEW  Comparison: September 04, 2012.  Findings: Cardiomediastinal silhouette appears normal.  Left lung is clear.  Linear density is noted in right midlung which is unchanged compared to prior exam and most consistent with scarring. Bony thorax is intact.  IMPRESSION: No acute cardiopulmonary abnormality seen.  Right midlung scarring is again noted.   Original Report Authenticated By: Venita Sheffield., M.D.      No diagnosis found.  Cardiac 81 sinus rhythm normal Pulse ox 99% room air normal   Date: 10/11/2012  Rate: 80  Rhythm: normal sinus rhythm  QRS Axis: left  Intervals: PR prolonged  ST/T Wave abnormalities: nonspecific T wave changes  Conduction Disutrbances:left anterior fascicular block  Narrative Interpretation:   Old EKG Reviewed: none available ABNORMAL  On repeat evaluation the patient notes a improvement in her left-sided chest pain. MDM  This 61 year old female with multiple medical problems now presents with ongoing left sided chest pain.  Following initial evaluation, the patient's pain improved with Toradol and morphine.  The patient's presentation is concerning for ACS versus unstable angina, though infectious, musculoskeletal, pulmonary embolism oral considered.  The patient does have a history of this, she is a  low-risk per was criteria with only a history of prior PE as a risk factor.  The patient is not hypoxic, tachypneic, tachycardic, and given her inability to pay for anticoagulants, with the low suspicion, CTA was not performed.  The patient's initial labs were reassuring, though her ECG was abnormal, with a left anterior fascicular block, Q waves anteriorly.  Given the patient's comorbidities she'll be admitted for further evaluation and management.  Gerhard Munch, MD 10/11/12 Rickey Primus

## 2012-10-11 NOTE — ED Notes (Signed)
Asked dr Artis Flock in regards to ntg for carelink, no order given at this time.

## 2012-10-11 NOTE — ED Provider Notes (Signed)
History     CSN: [id number]  Arrival date & time 10/11/12  1306   First MD Initiated Contact with Patient 10/11/12 1329      Chief Complaint  Patient presents with  . Chest Pain    (Consider location/radiation/quality/duration/timing/severity/associated sxs/prior treatment) Patient is a 61 y.o. female presenting with chest pain. The history is provided by the patient and the spouse.  Chest Pain The chest pain began 12 - 24 hours ago. Chest pain occurs constantly. The chest pain is unchanged. At its most intense, the pain is at 4/10. The pain is currently at 4/10. The severity of the pain is moderate. The quality of the pain is described as dull and aching. The pain radiates to the left neck. Primary symptoms include nausea and vomiting. Pertinent negatives for primary symptoms include no fever and no cough.  Associated symptoms include diaphoresis.     Past Medical History  Diagnosis Date  . Diabetes mellitus   . Hypertension   . Pulmonary embolism   . UTI (urinary tract infection)   . High cholesterol     Past Surgical History  Procedure Date  . Abdominal hysterectomy   . Cholecystectomy   . Esophagogastroduodenoscopy 08/19/2012    Procedure: ESOPHAGOGASTRODUODENOSCOPY (EGD);  Surgeon: Vertell Novak., MD;  Location: [facility] ENDOSCOPY;  Service: Endoscopy;  Laterality: N/A;    No family history on file.  History  Substance Use Topics  . Smoking status: Current Every Day Smoker -- 0.2 packs/day for 10 years    Types: Cigarettes  . Smokeless tobacco: Not on file  . Alcohol Use: No    OB History    Grav Para Term Preterm Abortions TAB SAB Ect Mult Living                  Review of Systems  Constitutional: Positive for diaphoresis. Negative for fever.  Respiratory: Negative for cough.   Cardiovascular: Positive for chest pain.  Gastrointestinal: Positive for nausea and vomiting.    Allergies  Keflex; Latex; Sulfa antibiotics; and Sulfur  Home Medications     Current Outpatient Rx  Name Route Sig Dispense Refill  . AMLODIPINE BESYLATE 10 MG PO TABS Oral Take 1 tablet (10 mg total) by mouth daily. 30 tablet 2  . INSULIN GLARGINE 100 UNIT/ML [place] SOLN Subcutaneous Inject 15 Units into the skin at bedtime.    Marland Kitchen LOSARTAN POTASSIUM 50 MG PO TABS Oral Take 100 mg by mouth daily. Take with hydrochlorothiazide    . MAGNESIUM CITRATE PO SOLN Oral Take 296 mLs by mouth once. 300 mL 0  . DOXYCYCLINE HYCLATE 100 MG PO TABS Oral Take 1 tablet (100 mg total) by mouth 2 (two) times daily. 14 tablet 0  . HYDROCHLOROTHIAZIDE 25 MG PO TABS Oral Take 25 mg by mouth daily. Take with losartan    . METHOCARBAMOL 500 MG PO TABS Oral Take 1 tablet (500 mg total) by mouth 3 (three) times daily. 45 tablet 0  . METOCLOPRAMIDE HCL 5 MG PO TABS Oral Take 5 mg by mouth 4 (four) times daily -  before meals and at bedtime.    . OXYCODONE-ACETAMINOPHEN 5-325 MG PO TABS Oral Take 1-2 tablets by mouth every 4 (four) hours as needed. For pain    . POLYETHYLENE GLYCOL 3350 PO POWD Oral Take 17 g by mouth daily. 255 g 0  . WARFARIN SODIUM 7.5 MG PO TABS Oral Take 1 tablet (7.5 mg total) by mouth daily. 30 tablet 0  BP 185/100  Pulse 90  Temp 98.2 F (36.8 C) (Oral)  Resp 20  SpO2 100%  Physical Exam  Nursing note and vitals reviewed. Constitutional: She is oriented to person, place, and time. She appears well-developed and well-nourished.  HENT:  Head: Normocephalic.  Right Ear: External ear normal.  Left Ear: External ear normal.  Mouth/Throat: Oropharynx is clear and moist.  Eyes: Pupils are equal, round, and reactive to light.  Neck: Normal range of motion. Neck supple.  Cardiovascular: Normal rate, regular rhythm, normal heart sounds and intact distal pulses.   Pulmonary/Chest: Effort normal and breath sounds normal. She exhibits tenderness.  Abdominal: Soft. Bowel sounds are normal. She exhibits no distension and no mass. There is no hepatosplenomegaly. There is  tenderness in the epigastric area. There is no rebound, no guarding and no CVA tenderness.  Lymphadenopathy:    She has no cervical adenopathy.  Neurological: She is alert and oriented to person, place, and time.  Skin: Skin is warm and dry.  Psychiatric: She has a normal mood and affect.    ED Course  Procedures (including critical care time)  Labs Reviewed  POCT I-STAT, CHEM 8 - Abnormal; Notable for the following:    Glucose, Bld 194 (*)     All other components within normal limits   No results found.   1. Chest pain at rest       MDM  [id number]--[id number].        Linna Hoff, MD 10/11/12 1359

## 2012-10-11 NOTE — Progress Notes (Signed)
At 23:20, patient's BP still elevated at 170/99. Discussed with MD. Patient to resume home medications tonight. Continuing to monitor closely.

## 2012-10-11 NOTE — ED Notes (Signed)
Pt here by [facility] from [facility] for chest pain since last, radiaiting to neck jaw, and arm,

## 2012-10-11 NOTE — ED Notes (Signed)
Pt c/o 5/10 achy cp radiating from left chest below breast to left shoulder blade started late last night while resting. Pt drove with husband to urgent care today, then to ED with EMS. Husband reports pt had clot to left chest a year ago and was treated in [place]. Pt a x4, ambulatory.

## 2012-10-11 NOTE — ED Notes (Signed)
Chest pain onset last night.  Pain in left chest and in left shoulder blade area.  Pain is constant.  This am, was burping, took a tums, no change in pain.  Mild nausea, one episode of vomiting.  Denies sob.  Broke out in sweat this am.  Patient was able to sleep, but reports pain has never gone away. "it just aches".  Denies having felt this way before

## 2012-10-11 NOTE — ED Notes (Signed)
Notified [facility] 

## 2012-10-12 ENCOUNTER — Observation Stay (HOSPITAL_COMMUNITY): Payer: Medicare Other

## 2012-10-12 DIAGNOSIS — E119 Type 2 diabetes mellitus without complications: Secondary | ICD-10-CM | POA: Diagnosis not present

## 2012-10-12 DIAGNOSIS — G8929 Other chronic pain: Secondary | ICD-10-CM | POA: Diagnosis not present

## 2012-10-12 DIAGNOSIS — R079 Chest pain, unspecified: Secondary | ICD-10-CM

## 2012-10-12 DIAGNOSIS — F17200 Nicotine dependence, unspecified, uncomplicated: Secondary | ICD-10-CM | POA: Diagnosis present

## 2012-10-12 DIAGNOSIS — E7800 Pure hypercholesterolemia, unspecified: Secondary | ICD-10-CM | POA: Diagnosis not present

## 2012-10-12 DIAGNOSIS — R918 Other nonspecific abnormal finding of lung field: Secondary | ICD-10-CM | POA: Diagnosis not present

## 2012-10-12 DIAGNOSIS — M5137 Other intervertebral disc degeneration, lumbosacral region: Secondary | ICD-10-CM | POA: Diagnosis not present

## 2012-10-12 DIAGNOSIS — M47814 Spondylosis without myelopathy or radiculopathy, thoracic region: Secondary | ICD-10-CM | POA: Diagnosis not present

## 2012-10-12 DIAGNOSIS — Z86711 Personal history of pulmonary embolism: Secondary | ICD-10-CM | POA: Diagnosis not present

## 2012-10-12 DIAGNOSIS — E46 Unspecified protein-calorie malnutrition: Secondary | ICD-10-CM | POA: Diagnosis present

## 2012-10-12 DIAGNOSIS — J90 Pleural effusion, not elsewhere classified: Secondary | ICD-10-CM | POA: Diagnosis not present

## 2012-10-12 DIAGNOSIS — J9819 Other pulmonary collapse: Secondary | ICD-10-CM | POA: Diagnosis not present

## 2012-10-12 DIAGNOSIS — M549 Dorsalgia, unspecified: Secondary | ICD-10-CM | POA: Diagnosis not present

## 2012-10-12 DIAGNOSIS — I2699 Other pulmonary embolism without acute cor pulmonale: Secondary | ICD-10-CM | POA: Diagnosis not present

## 2012-10-12 DIAGNOSIS — IMO0002 Reserved for concepts with insufficient information to code with codable children: Secondary | ICD-10-CM | POA: Diagnosis not present

## 2012-10-12 DIAGNOSIS — D72829 Elevated white blood cell count, unspecified: Secondary | ICD-10-CM | POA: Diagnosis present

## 2012-10-12 DIAGNOSIS — J984 Other disorders of lung: Secondary | ICD-10-CM | POA: Diagnosis not present

## 2012-10-12 DIAGNOSIS — M47817 Spondylosis without myelopathy or radiculopathy, lumbosacral region: Secondary | ICD-10-CM | POA: Diagnosis not present

## 2012-10-12 DIAGNOSIS — I10 Essential (primary) hypertension: Secondary | ICD-10-CM | POA: Diagnosis present

## 2012-10-12 DIAGNOSIS — R109 Unspecified abdominal pain: Secondary | ICD-10-CM | POA: Diagnosis not present

## 2012-10-12 DIAGNOSIS — G609 Hereditary and idiopathic neuropathy, unspecified: Secondary | ICD-10-CM | POA: Diagnosis present

## 2012-10-12 LAB — CBC WITH DIFFERENTIAL/PLATELET
Basophils Absolute: 0 10*3/uL (ref 0.0–0.1)
Eosinophils Absolute: 0.3 10*3/uL (ref 0.0–0.7)
Eosinophils Relative: 3 % (ref 0–5)
MCH: 28 pg (ref 26.0–34.0)
MCV: 83.3 fL (ref 78.0–100.0)
Platelets: 339 10*3/uL (ref 150–400)
RDW: 15.3 % (ref 11.5–15.5)

## 2012-10-12 LAB — GLUCOSE, CAPILLARY: Glucose-Capillary: 153 mg/dL — ABNORMAL HIGH (ref 70–99)

## 2012-10-12 LAB — BASIC METABOLIC PANEL
CO2: 26 mEq/L (ref 19–32)
Calcium: 8.2 mg/dL — ABNORMAL LOW (ref 8.4–10.5)
GFR calc non Af Amer: 89 mL/min — ABNORMAL LOW (ref >90)
Glucose, Bld: 163 mg/dL — ABNORMAL HIGH (ref 70–99)
Potassium: 3 mEq/L — ABNORMAL LOW (ref 3.5–5.1)
Sodium: 141 mEq/L (ref 135–145)

## 2012-10-12 LAB — RAPID URINE DRUG SCREEN, HOSP PERFORMED
Barbiturates: NOT DETECTED
Benzodiazepines: NOT DETECTED
Cocaine: NOT DETECTED
Opiates: POSITIVE — AB

## 2012-10-12 LAB — URINE MICROSCOPIC-ADD ON

## 2012-10-12 LAB — LIPID PANEL
Cholesterol: 214 mg/dL — ABNORMAL HIGH (ref 0–200)
VLDL: 48 mg/dL — ABNORMAL HIGH (ref 0–40)

## 2012-10-12 LAB — URINALYSIS, ROUTINE W REFLEX MICROSCOPIC
Glucose, UA: 100 mg/dL — AB
Leukocytes, UA: NEGATIVE
Specific Gravity, Urine: >1.046 — ABNORMAL HIGH (ref 1.005–1.030)
Urobilinogen, UA: 0.2 mg/dL (ref 0.0–1.0)

## 2012-10-12 MED ORDER — WARFARIN SODIUM 7.5 MG PO TABS
7.5000 mg | ORAL_TABLET | Freq: Once | ORAL | Status: AC
  Administered 2012-10-12: 7.5 mg via ORAL
  Filled 2012-10-12: qty 1

## 2012-10-12 MED ORDER — NICOTINE 14 MG/24HR TD PT24
14.0000 mg | MEDICATED_PATCH | Freq: Every day | TRANSDERMAL | Status: DC
  Administered 2012-10-14 – 2012-10-17 (×4): 14 mg via TRANSDERMAL
  Filled 2012-10-12 (×6): qty 1

## 2012-10-12 MED ORDER — INSULIN GLARGINE 100 UNIT/ML ~~LOC~~ SOLN
15.0000 [IU] | Freq: Every day | SUBCUTANEOUS | Status: DC
  Administered 2012-10-12: 15 [IU] via SUBCUTANEOUS

## 2012-10-12 MED ORDER — ENOXAPARIN SODIUM 120 MG/0.8ML ~~LOC~~ SOLN
110.0000 mg | Freq: Two times a day (BID) | SUBCUTANEOUS | Status: DC
  Administered 2012-10-12 – 2012-10-16 (×9): 110 mg via SUBCUTANEOUS
  Filled 2012-10-12 (×12): qty 0.8

## 2012-10-12 MED ORDER — WARFARIN - PHARMACIST DOSING INPATIENT: Freq: Every day | Status: DC

## 2012-10-12 MED ORDER — GLUCERNA SHAKE PO LIQD: 237.0000 mL | Freq: Every day | ORAL | Status: DC | PRN

## 2012-10-12 MED ORDER — COUMADIN BOOK
Freq: Once | Status: AC
  Administered 2012-10-12: 17:00:00
  Filled 2012-10-12: qty 1

## 2012-10-12 MED ORDER — IOHEXOL 350 MG/ML SOLN
100.0000 mL | Freq: Once | INTRAVENOUS | Status: AC | PRN
  Administered 2012-10-12: 100 mL via INTRAVENOUS

## 2012-10-12 MED ORDER — WARFARIN VIDEO
Freq: Once | Status: AC
  Administered 2012-10-12: 17:00:00

## 2012-10-12 MED ORDER — POTASSIUM CHLORIDE CRYS ER 20 MEQ PO TBCR
40.0000 meq | EXTENDED_RELEASE_TABLET | ORAL | Status: AC
  Administered 2012-10-12 (×2): 40 meq via ORAL
  Filled 2012-10-12 (×2): qty 2

## 2012-10-12 NOTE — Progress Notes (Signed)
ANTICOAGULATION CONSULT NOTE - Initial Consult  Pharmacy Consult for Lovenox Indication: PE  Allergies  Allergen Reactions  . Keflex (Cephalexin) Itching and Swelling    Lips and eyes swelled up  . Latex Itching  . Sulfa Antibiotics Rash  . Sulfur Rash    Patient Measurements: Height: 5' 8.9" (175 cm) Weight: 239 lb 3.2 oz (108.5 kg) IBW/kg (Calculated) : 65.97   Vital Signs: Temp: 98.1 F (36.7 C) (10/22 2317) BP: 152/81 mmHg (10/23 0220) Pulse Rate: 84  (10/23 0220)  Labs:  Basename 10/11/12 2221 10/11/12 1941 10/11/12 1604 10/11/12 1325  HGB -- -- 13.8 14.6  HCT -- -- 40.3 43.0  PLT -- -- 364 --  APTT -- -- -- --  LABPROT -- -- 13.0 --  INR -- -- 0.99 --  HEPARINUNFRC -- -- -- --  CREATININE -- -- 0.79 0.90  CKTOTAL -- -- -- --  CKMB -- -- -- --  TROPONINI <0.30 <0.30 -- --    Estimated Creatinine Clearance: 94.3 ml/min (by C-G formula based on Cr of 0.79).   Medical History: Past Medical History  Diagnosis Date  . Diabetes mellitus     diagnosed in 2000.    Marland Kitchen Hypertension   . Pulmonary embolism     2011 treated at [facility] in [place].  Was on Coumadin for over a year.  no known family history  . UTI (urinary tract infection)   . High cholesterol     Medications:  Prescriptions prior to admission  Medication Sig Dispense Refill  . amLODipine (NORVASC) 10 MG tablet Take 1 tablet (10 mg total) by mouth daily.  30 tablet  2  . hydrochlorothiazide (HYDRODIURIL) 25 MG tablet Take 25 mg by mouth daily. Take with losartan      . insulin glargine (LANTUS) 100 UNIT/ML injection Inject 15 Units into the skin at bedtime.      Marland Kitchen losartan (COZAAR) 50 MG tablet Take 100 mg by mouth daily. Take with hydrochlorothiazide      . methocarbamol (ROBAXIN) 500 MG tablet Take 1 tablet (500 mg total) by mouth 3 (three) times daily.  45 tablet  0  . oxyCODONE-acetaminophen (PERCOCET/ROXICET) 5-325 MG per tablet Take 1-2 tablets by mouth every 4 (four) hours as needed. For  pain      . polyethylene glycol powder (MIRALAX) powder Take 17 g by mouth daily.  255 g  0  . doxycycline (VIBRA-TABS) 100 MG tablet Take 1 tablet (100 mg total) by mouth 2 (two) times daily.  14 tablet  0    Assessment: 61 yo female with PE for Lovenox.  Lovenox 40 mg SQ given at midnight.  Goal of Therapy:  Full anticoagulation with Lovenox Monitor platelets by anticoagulation protocol: Yes   Plan:  Lovenox 110 mg SQ q12h  Annie Roseboom, Gary Fleet 10/12/2012,3:41 AM

## 2012-10-12 NOTE — Progress Notes (Signed)
ANTICOAGULATION CONSULT NOTE - Initial Consult  Pharmacy Consult for Coumadin Indication: VTE treatment (PE)  Allergies  Allergen Reactions  . Keflex (Cephalexin) Itching and Swelling    Lips and eyes swelled up  . Latex Itching  . Sulfa Antibiotics Rash  . Sulfur Rash    Patient Measurements: Height: 5' 8.9" (175 cm) Weight: 239 lb 3.2 oz (108.5 kg) IBW/kg (Calculated) : 65.97   Vital Signs: Temp: 98.3 F (36.8 C) (10/23 0622) Temp src: Oral (10/23 0622) BP: 140/86 mmHg (10/23 0945) Pulse Rate: 79  (10/23 0622)  Labs:  Alvira Philips 10/12/12 0853 10/12/12 0525 10/11/12 2221 10/11/12 1604 10/11/12 1325  HGB -- 12.1 -- 13.8 --  HCT -- 36.0 -- 40.3 43.0  PLT -- 339 -- 364 --  APTT -- -- -- -- --  LABPROT -- -- -- 13.0 --  INR -- -- -- 0.99 --  HEPARINUNFRC -- -- -- -- --  CREATININE -- 0.73 -- 0.79 0.90  CKTOTAL -- -- -- -- --  CKMB -- -- -- -- --  TROPONINI <0.30 <0.30 <0.30 -- --    Estimated Creatinine Clearance: 94.3 ml/min (by C-G formula based on Cr of 0.73).   Medical History: Past Medical History  Diagnosis Date  . Diabetes mellitus     diagnosed in 2000.    Marland Kitchen Hypertension   . Pulmonary embolism     2011 treated at [facility] in [place].  Was on Coumadin for over a year.  no known family history  . UTI (urinary tract infection)   . High cholesterol     Medications:  Scheduled:    . amLODipine  10 mg Oral Daily  . aspirin  324 mg Oral Once  . aspirin EC  81 mg Oral Daily  . enoxaparin (LOVENOX) injection  110 mg Subcutaneous BID  . hydrALAZINE  10 mg Intravenous STAT  . insulin aspart  0-15 Units Subcutaneous TID WC  . insulin glargine  10 Units Subcutaneous QHS  . ketorolac  30 mg Intravenous Once  . losartan  100 mg Oral Daily  .  morphine injection  4 mg Intravenous Once  .  morphine injection  4 mg Intravenous Once  . pantoprazole  40 mg Oral Daily  . polyethylene glycol powder  17 g Oral Daily  . potassium chloride  40 mEq Oral Q4H  .  sodium chloride  3 mL Intravenous Q12H  . DISCONTD: amLODipine  10 mg Oral Daily  . DISCONTD: enoxaparin (LOVENOX) injection  40 mg Subcutaneous Q24H  . DISCONTD: losartan  100 mg Oral Daily    Assessment: 61 yo female with RLL PE (on CT angio of chest) currently on full dose Lovenox per pharmacy to start on Coumadin (Overlap day #1).  Previously on Coumadin for hx of PE in 2011, off since 08/2012. Previously started with 7.5 mg doses. Baseline INR on admission (10/11/12) of 0.99. Hgb 12.1 ok, plt 339 stable.   Goal of Therapy:  INR 2-3 Monitor platelets by anticoagulation protocol: Yes   Plan:  1. Coumadin 7.5 mg PO x1 today 2. Daily PT/INR 3. Coumadin book/video  Crist Fat L 10/12/2012,11:46 AM

## 2012-10-12 NOTE — Progress Notes (Signed)
Utilization Review Completed.  

## 2012-10-12 NOTE — Care Management Note (Unsigned)
    Page 1 of 1   10/12/2012     2:20:20 PM   CARE MANAGEMENT NOTE 10/12/2012  Patient:  Joy Butler, Joy Butler   Account Number:  [id number]  Date Initiated:  10/12/2012  Documentation initiated by:  GRAVES-BIGELOW,Enda Santo  Subjective/Objective Assessment:   Pt admitted with cp. Pt is + for PE. Per pt she has her own home now. She was previously homeless in [place]. Pt states she is without a refrigerator and stove. CM did speak to CSW and she is to provide pt with resources.     Action/Plan:   CM will continue to monitor for disposition needs.   Anticipated DC Date:  10/13/2012   Anticipated DC Plan:  HOME W HOME HEALTH SERVICES      DC Planning Services  CM consult      Choice offered to / List presented to:             Status of service:  In process, will continue to follow Medicare Important Message given?   (If response is "NO", the following Medicare IM given date fields will be blank) Date Medicare IM given:   Date Additional Medicare IM given:    Discharge Disposition:    Per UR Regulation:  Reviewed for med. necessity/level of care/duration of stay  If discussed at Long Length of Stay Meetings, dates discussed:    Comments:

## 2012-10-12 NOTE — Progress Notes (Addendum)
Subjective:    Interval Events:  Patient reports that her chest pain has now moved to the back but still on the left side. She does not reports increased SOB. Over the night she remained stable. No other new complaints.    Objective:    Vital Signs:   Temp:  [98.1 F (36.7 C)-98.3 F (36.8 C)] 98.3 F (36.8 C) (10/23 0622) Pulse Rate:  [74-85] 79  (10/23 0622) Resp:  [13-18] 18  (10/23 0622) BP: (140-176)/(74-99) 140/86 mmHg (10/23 0945) SpO2:  [94 %-99 %] 98 % (10/23 0622) Weight:  [239 lb 3.2 oz (108.5 kg)] 239 lb 3.2 oz (108.5 kg) (10/23 0300) Last BM Date: 10/11/12   Weights: 24-hour Weight change:   Filed Weights   10/12/12 0300  Weight: 239 lb 3.2 oz (108.5 kg)     Intake/Output:   Intake/Output Summary (Last 24 hours) at 10/12/12 1856 Last data filed at 10/12/12 0700  Gross per 24 hour  Intake      0 ml  Output    100 ml  Net   -100 ml       Physical Exam: Patient is a well-developed and well-nourished morbidly obese woman in no acute distress and cooperative with exam. Alert and oriented x3.  Head: Normocephalic and atraumatic  Ear: TM normal bilaterally  Mouth: no erythema or exudates, MMM  Eyes: PERRL, EOMI, conjunctivae normal, No scleral icterus.  Neck: Supple, Trachea midline normal ROM, No JVD, mass, thyromegaly, or carotid bruit present.  Cardiovascular: RRR, S1 normal, S2 normal, no MRG, pulses symmetric and intact bilaterally, no pain to palpation over the chest wall. There is discoloration beneath the left breast but no moisture or odor.  Pulmonary/Chest: CTAB, no wheezes, rales, or rhonchi. She demonstrated reproducible tenderness on the left side of her chest at the back. Abdominal: There is darkening of the skin over the left side of the abdomen that the patient states is unchanged for many years. Surgical scar from open Cholecystectomy noted, hysterectomy scar noted. Soft. Non-tender, non-distended, bowel sounds are normal, no masses,  organomegaly, or guarding present.  GU: no CVA tenderness Musculoskeletal: No joint deformities, erythema, or stiffness, ROM full and no nontender Hematology: no cervical, inginal, or axillary adenopathy.  Neurological: A&O x3, Strength is normal and symmetric bilaterally, cranial nerve II-XII are grossly intact, no focal motor deficit, sensory intact to light touch bilaterally.  Skin: Warm, dry and intact. No rash, cyanosis, or clubbing.  Psychiatric: Normal mood and affect. speech and behavior is normal. Judgment and thought content normal. Cognition and memory are normal.   Labs: Basic Metabolic Panel:  Lab 10/12/12 1610 10/11/12 2222 10/11/12 1604 10/11/12 1325  NA 141 -- 138 143  K 3.0* -- 3.8 3.6  CL 106 -- 102 105  CO2 26 -- 26 --  GLUCOSE 163* -- 160* 194*  BUN 9 -- 10 8  CREATININE 0.73 -- 0.79 0.90  CALCIUM 8.2* -- 9.2 --  MG -- 1.8 -- --  PHOS -- -- -- --    Liver Function Tests:  Lab 10/11/12 1604  AST 11  ALT <5  ALKPHOS 126*  BILITOT 0.3  PROT 7.1  ALBUMIN 2.5*   CBC:  Lab 10/12/12 0525 10/11/12 1604 10/11/12 1325  WBC 8.8 10.9* --  NEUTROABS 4.8 -- --  HGB 12.1 13.8 14.6  HCT 36.0 40.3 43.0  MCV 83.3 82.2 --  PLT 339 364 --    Cardiac Enzymes:  Lab 10/12/12 0853 10/12/12 0525 10/11/12 2221  10/11/12 1941  CKTOTAL -- -- -- --  CKMB -- -- -- --  CKMBINDEX -- -- -- --  TROPONINI <0.30 <0.30 <0.30 <0.30    CBG:  Lab 10/12/12 1140 10/12/12 0717  GLUCAP 153* 161*    Coagulation Studies:  Basename 10/11/12 1604  LABPROT 13.0  INR 0.99    Microbiology: Results for orders placed during the hospital encounter of 08/31/12  CULTURE, BLOOD (ROUTINE X 2)     Status: Normal   Collection Time   09/06/12  3:10 PM      Component Value Range Status Comment   Specimen Description BLOOD RIGHT ARM   Final    Special Requests BOTTLES DRAWN AEROBIC AND ANAEROBIC 10CC   Final    Culture  Setup Time 09/06/2012 21:06   Final    Culture NO GROWTH 5 DAYS    Final    Report Status 09/12/2012 FINAL   Final   CULTURE, BLOOD (ROUTINE X 2)     Status: Normal   Collection Time   09/06/12  3:20 PM      Component Value Range Status Comment   Specimen Description BLOOD RIGHT ARM   Final    Special Requests BOTTLES DRAWN AEROBIC ONLY 10CC   Final    Culture  Setup Time 09/06/2012 21:08   Final    Culture NO GROWTH 5 DAYS   Final    Report Status 09/12/2012 FINAL   Final      Imaging: Dg Chest 2 View  10/11/2012  *RADIOLOGY REPORT*  Clinical Data: Chest pain.  CHEST - 2 VIEW  Comparison: September 04, 2012.  Findings: Cardiomediastinal silhouette appears normal.  Left lung is clear.  Linear density is noted in right midlung which is unchanged compared to prior exam and most consistent with scarring. Bony thorax is intact.  IMPRESSION: No acute cardiopulmonary abnormality seen.  Right midlung scarring is again noted.   Original Report Authenticated By: Venita Sheffield., M.D.    Ct Angio Chest Pe W/cm &/or Wo Cm  10/12/2012  *RADIOLOGY REPORT*  Clinical Data: Chest pain  CT ANGIOGRAPHY CHEST  Technique:  Multidetector CT imaging of the chest using the standard protocol during bolus administration of intravenous contrast. Multiplanar reconstructed images including MIPs were obtained and reviewed to evaluate the vascular anatomy.  Contrast: OMNIPAQUE IOHEXOL 350 MG/ML SOLN  Comparison: 10/11/2012 radiograph, 09/02/2012 abdominal CT  Findings: There is a branching pulmonary arterial filling defect within the the right lower lobe pulmonary artery, extending into proximal segmental branches.  The upper lobe arterial branches are inadequately opacified.  I do not see a filling defect within the left lower lobe branches.  Heart size is upper normal to mildly enlarged. No overt evidence for right ventricular heart strain.  Aortic valve and coronary artery calcifications.  No pleural or pericardial effusion. No intrathoracic lymphadenopathy.  Respiratory motion  degrades detailed parenchymal evaluation.  There are areas of mosaic attenuation, favored to reflect air trapping and atelectasis.  No pneumothorax.  Linear left lung base opacity is favored to reflect scarring or atelectasis.  Limited imaging through the upper abdomen shows areas of hypoattenuation within the spleen, decreased in size from the prior, favored to reflect sequelae of prior splenic infarctions.  IMPRESSION: Right lower lobe pulmonary embolism.  No overt CT evidence for right ventricular heart strain.  Evolving splenic infarctions.  Critical Value/emergent results were called by telephone at the time of interpretation on 10/12/2012 at 02:40 a.m. to Dr. Collier Bullock, who verbally  acknowledged these results.   Original Report Authenticated By: Waneta Martins, M.D.       Medications:    Infusions:    . sodium chloride Stopped (10/12/12 0741)  . DISCONTD: sodium chloride       Scheduled Medications:    . amLODipine  10 mg Oral Daily  . aspirin EC  81 mg Oral Daily  . coumadin book   Does not apply Once  . enoxaparin (LOVENOX) injection  110 mg Subcutaneous BID  . hydrALAZINE  10 mg Intravenous STAT  . insulin aspart  0-15 Units Subcutaneous TID WC  . insulin glargine  10 Units Subcutaneous QHS  . losartan  100 mg Oral Daily  .  morphine injection  4 mg Intravenous Once  . pantoprazole  40 mg Oral Daily  . polyethylene glycol powder  17 g Oral Daily  . potassium chloride  40 mEq Oral Q4H  . sodium chloride  3 mL Intravenous Q12H  . warfarin  7.5 mg Oral ONCE-1800  . warfarin   Does not apply Once  . Warfarin - Pharmacist Dosing Inpatient   Does not apply q1800  . DISCONTD: amLODipine  10 mg Oral Daily  . DISCONTD: enoxaparin (LOVENOX) injection  40 mg Subcutaneous Q24H  . DISCONTD: losartan  100 mg Oral Daily     PRN Medications: feeding supplement, iohexol, morphine injection, nitroGLYCERIN, ondansetron (ZOFRAN) IV, ondansetron, oxyCODONE, DISCONTD:  HYDROmorphone  (DILAUDID) injection   Assessment/ Plan:   Bonaventura is a 61 year old woman with a PMH significant for Diabetes, hypertension, PE, and recent Splenic infarct who presents because of left sided aching chest pain for the last 24 hours.  Right PE: She has a history of PE in 2011 and her modified Geneva score was 6 which is intermediate probability with points for a previous PE and her pulse rate >75. Chest CT angiogram confirmed a right lower lobe PE. The patient reports that in 2011, she was managed with Coumadin, but she was unable to continue with followup with INR and therefore stopped therapy. She does not report other risk factors for pulmonary embolism like swelling of a leg, or recent long distance travel. In September 2013, she had an extensive workup for coagulopathies including protein C, and protein, S, homocysteine, Beta -2 glycoprotein, factor V Leyden, antithrombin III which were all negative. She had decreased levels of anticardiolipin, IgG and IgM. Her lupus anticoagulant panel indicated high values. Plan -Will consider longer anticoagulation given her history of recurrent thromboembolism. She has been started on oral Coumadin as per pharmacy with heparin bridging. Once therapeutic INR is achieved and maintained for at least 24hours she'll be discharged on oral Coumadin, to followup with her regular INR checks as outpatient. -Patient will need outpatient followup with internal medicine clinic as she does not have a PCP. -Continue following up INR  Left Chest pain: The cause of this patient's chest pain is still elusive. She now described the chest pain as dull and achingand has migrated from the left anterior to the posterior side still in the left side. She has significant risk factors for coronary artery disease, including cigarette smoking, hypertension, diabetes, hyperlipidemia and obesity however her chest pain is very atypical. The most likely cause of this chest pain is splenic  infarction as opposed to a cardiac cause. She denies other possible causes of this chest pain, including pancreatitis-she does not report nausea or vomiting, and she does not have any risk factors for pancreatitis like alcohol ingestion.  The level of her hypertriglyceridemia (239) is not significant enough (<1000) to cause pancreatitis as most pancreatitis. Aortic dissection would have been identified on chest CT angiogram. Chest x-ray was normal. White blood cell count has normalized from 10.9-8.8 without any antibiotics, which further excludes pneumonia. Plan - Pain medication with when necessary Oxycodone - There need to no further evaluate her for cardiac causes of her chest pain given negative troponin and EKG - Discontinue cardiac telemetry.  - Will do EKG, as needed.  Diabetes Mellitius: The patient is managed on Lantus 15 U. On admission she was restarted Lantus at 10 UI with a meal coverage. Her blood sugars continue to remain elevated at 150s to 160s. She also reports that she has peripheral neuropathy as a complication of diabetes. Plan - Increase to home dose of 15 U  - SSI  -Continue to monitor CBGs.  Peripheral neuropathies: Patient reports that she has had a long history of peripheral neuropathy. She reports that her doctor advised her to walk from [place] to the [place] that the warm weather there would improve her peripheral neuropathy. However, she has continued experiencing peripheral neuropathies involving the feet in the socks distribution pattern. She also reports that her legs sometimes give way with a recent history of a fall. Plan -This patient would benefit from pregabalin however, the cost might be prohibitive. I would, therefore recommend a more affordable option that could be effective like amitriptyline starting at 12.5 mg at night.  HTN: She is hypertensive today and states that she has been out of her medications for at least 2 weeks. Blood pressure is now well  controlled in the ranges of systolic BPs of 140s and diastolic BPs of 80s Plan -Continue with Losartan, Amlodipine -We can consider restarting her on HCTZ, if the blood pressures remain uncontrolled.  Smoking: Ms. Rosasco reports history of smoking about 6-7 cigarettes per day. In the past quit for 2 years. However, she relapsed over the last year. She has desire to quit with assistance. She is agreeable to a nicotine patch. Plan -Nicotine patch.  Low material resources: She has been intermittently homeless over the last two months since moving to [facility] but recently found a place to live but per her report it did not come with appliances which they are saving to try to afford. She has not been able to afford her medications on her fixed income but has [employer] to help her pay for her medications.  - Consult CSW to see if there are any resources that we can tap into to help her afford her medications.     Length of Stay: 1 days   Signed by:  Dow Adolph PGY-I, Internal Medicine Pager [phone] 10/12/2012, 6:56 PM

## 2012-10-12 NOTE — Progress Notes (Signed)
At 02:15, patient returned from radiology by wheelchair. Assisted to toilet, per patient's request. Moderate SOB and gait disturbance noted walking to bathroom and back to bed. Upon returning to bed, patient stated she was having 8/10 chest pain, left-sided, radiating through to back and left shoulder, similar pain noted earlier. No nausea or diaphoresis noted.  VS were:  P 84, BP 152/81, SPO2 97% on room air.  Placed patient back on telemetry. Patient became tearful and required emotional support.  Gave morphine 4mg  IV for pain and continued to provide close support at bedside for approximately 10 minutes. Patient became somewhat somnolent at this point. Stated "feeling much better" and rating pain 2-3/10. Chest pain education reinforced briefly. Recommended bed rest with assistance to [facility] as needed to patient.  Continuing to monitor closely.

## 2012-10-12 NOTE — Progress Notes (Signed)
INITIAL ADULT NUTRITION ASSESSMENT Date: 10/12/2012   Time: 12:46 PM  INTERVENTION:  Glucerna Shake supplement PRN (220 kcals, 9.9 gm protein per 8 fl oz can) RD to follow for nutrition care plan  DOCUMENTATION CODES Per approved criteria  -Obesity Unspecified   Reason for Assessment: Malnutrition Screening Tool Report  ASSESSMENT: Female 61 y.o.  Dx: Chest pain  Hx:  Past Medical History  Diagnosis Date  . Diabetes mellitus     diagnosed in 2000.    Marland Kitchen Hypertension   . Pulmonary embolism     2011 treated at [facility] in [place].  Was on Coumadin for over a year.  no known family history  . UTI (urinary tract infection)   . High cholesterol     Related Meds:     . amLODipine  10 mg Oral Daily  . aspirin  324 mg Oral Once  . aspirin EC  81 mg Oral Daily  . coumadin book   Does not apply Once  . enoxaparin (LOVENOX) injection  110 mg Subcutaneous BID  . hydrALAZINE  10 mg Intravenous STAT  . insulin aspart  0-15 Units Subcutaneous TID WC  . insulin glargine  10 Units Subcutaneous QHS  . ketorolac  30 mg Intravenous Once  . losartan  100 mg Oral Daily  .  morphine injection  4 mg Intravenous Once  .  morphine injection  4 mg Intravenous Once  . pantoprazole  40 mg Oral Daily  . polyethylene glycol powder  17 g Oral Daily  . potassium chloride  40 mEq Oral Q4H  . sodium chloride  3 mL Intravenous Q12H  . warfarin  7.5 mg Oral ONCE-1800  . warfarin   Does not apply Once  . Warfarin - Pharmacist Dosing Inpatient   Does not apply q1800  . DISCONTD: amLODipine  10 mg Oral Daily  . DISCONTD: enoxaparin (LOVENOX) injection  40 mg Subcutaneous Q24H  . DISCONTD: losartan  100 mg Oral Daily    Ht: 5' 8.9" (175 cm)  Wt: 239 lb 3.2 oz (108.5 kg)  Ideal Wt: 63.6 kg % Ideal Wt: 171%  Usual Wt: 108 kg -- August 2013 % Usual Wt: 100%  Body mass index is 35.43 kg/(m^2).  Food/Nutrition Related Hx: recent weight lost without trying and decreased appetite per  admission nutrition screen  Labs:  CMP     Component Value Date/Time   NA 141 10/12/2012 0525   K 3.0* 10/12/2012 0525   CL 106 10/12/2012 0525   CO2 26 10/12/2012 0525   GLUCOSE 163* 10/12/2012 0525   BUN 9 10/12/2012 0525   CREATININE 0.73 10/12/2012 0525   CALCIUM 8.2* 10/12/2012 0525   PROT 7.1 10/11/2012 1604   ALBUMIN 2.5* 10/11/2012 1604   AST 11 10/11/2012 1604   ALT <5 10/11/2012 1604   ALKPHOS 126* 10/11/2012 1604   BILITOT 0.3 10/11/2012 1604   GFRNONAA 89* 10/12/2012 0525   GFRAA >90 10/12/2012 0525     Intake/Output Summary (Last 24 hours) at 10/12/12 1247 Last data filed at 10/12/12 0700  Gross per 24 hour  Intake      0 ml  Output    100 ml  Net   -100 ml    CBG (last 3)   Basename 10/12/12 1140 10/12/12 0717  GLUCAP 153* 161*    Diet Order: Carb Control  Supplements/Tube Feeding: N/A  IVF:    sodium chloride Last Rate: Stopped (10/12/12 0741)  DISCONTD: sodium chloride  Estimated Nutritional Needs:   Kcal: 2000-2200 Protein: 100-110 gm Fluid: 2.0-2.2 L  Patient presented to the ED complaining of left sided chest pain; patient tearful upon RD visitation and interview; reports her appetite has been decreased; no % meal intake recorded at this time; she also endorses some weight loss, however, unable to quantify amount or time frame; she is amenable to Glucerna Shake prn -- RD to order.  NUTRITION DIAGNOSIS: -Inadequate oral intake (NI-2.1).  Status: Ongoing  RELATED TO: decreased appetite  AS EVIDENCE BY: patient report  MONITORING/EVALUATION(Goals): Goal: Oral intake with meals & supplements to meet >/= 90% of estimated nutrition needs Monitor: PO & supplemental intake, weight, labs, I/O's  EDUCATION NEEDS: -No education needs identified at this time  Dietitian #: [phone]  Alger Memos 10/12/2012, 12:46 PM

## 2012-10-12 NOTE — H&P (Signed)
Internal Medicine Attending Admission Note Date: 10/12/2012  Patient name: Joy Butler Medical record number: [id number] Date of birth: [birth year] Age: 61 y.o. Gender: female  I saw and evaluated the patient. I reviewed the resident's note and I agree with the resident's findings and plan as documented in the resident's note.  Chief Complaint(s): Chest pain  History - key components related to admission: [name] is a 61 year old female with past medical history of diabetes type 2, hypertension, pulmonary embolism in 2011 and splenic infarct who presented to [facility]  complaining of left-sided chest pain since 1 day. Pain is located in her upper left sided back. There is no radiation. Pain is described as mild to moderate, it's more like an ache. There are no exacerbating or relieving factors. Not associated with cough. It is persistent in nature.  Patient denies any fevers, chills, vomiting, shortness of breath, abdominal pain, change in bowel movements. She denies any sick contacts.  Patient has not been able to afford her medications and has been out of them for last 2 weeks.  16 point review of system is unremarkable except what is noted above.    Physical Exam - key components related to admission:  Filed Vitals:   10/12/12 0220 10/12/12 0300 10/12/12 0622 10/12/12 0945  BP: 152/81  155/82 140/86  Pulse: 84  79   Temp:   98.3 F (36.8 C)   TempSrc:   Oral   Resp:   18   Height:  5' 8.9" (1.75 m)    Weight:  239 lb 3.2 oz (108.5 kg)    SpO2: 97%  98%    Very pleasant and cooperative female with no acute distress Cardiovascular: Normal S1-S2, regular rate and rhythm, no murmurs rubs or gallops, no pain noted on palpation of chest or back Respiratory: Clear to auscultation bilaterally with no wheezing rales or rhonchi Abdomen: Bowel sounds normal, nontender and distended secondary to fat, no organomegaly Musculoskeletal: No tenderness noted and able to move all  extremities without any restrictions Neuro: Nonfocal Extremities: 2+ distal pulses Lab results:   Basic Metabolic Panel:  Basename 10/12/12 0525 10/11/12 2222 10/11/12 1604  NA 141 -- 138  K 3.0* -- 3.8  CL 106 -- 102  CO2 26 -- 26  GLUCOSE 163* -- 160*  BUN 9 -- 10  CREATININE 0.73 -- 0.79  CALCIUM 8.2* -- 9.2  MG -- 1.8 --  PHOS -- -- --   Liver Function Tests:  [place] 10/11/12 1604  AST 11  ALT <5  ALKPHOS 126*  BILITOT 0.3  PROT 7.1  ALBUMIN 2.5*   CBC:  Basename 10/12/12 0525 10/11/12 1604  WBC 8.8 10.9*  NEUTROABS 4.8 --  HGB 12.1 13.8  HCT 36.0 40.3  MCV 83.3 82.2  PLT 339 364   Cardiac Enzymes:  Basename 10/12/12 0853 10/12/12 0525 10/11/12 2221  CKTOTAL -- -- --  CKMB -- -- --  CKMBINDEX -- -- --  TROPONINI <0.30 <0.30 <0.30   CBG:  Basename 10/12/12 1140 10/12/12 0717  GLUCAP 153* 161*   Fasting Lipid Panel:  Basename 10/12/12 0525  CHOL 214*  HDL 33*  LDLCALC 133*  TRIG 239*  CHOLHDL 6.5  LDLDIRECT --   Thyroid Function Tests:  Basename 10/11/12 2222  TSH 1.372  T4TOTAL --  FREET4 --  T3FREE --  THYROIDAB --   Coagulation:  Basename 10/11/12 1604  INR 0.99   Imaging results:  Dg Chest 2 View  10/11/2012  IMPRESSION: No  acute cardiopulmonary abnormality seen.  Right midlung scarring is again noted.   Original Report Authenticated By: Venita Sheffield., M.D.    Ct Angio Chest Pe W/cm &/or Wo Cm  10/12/2012 IMPRESSION: Right lower lobe pulmonary embolism.  No overt CT evidence for right ventricular heart strain.  Evolving splenic infarctions.  Critical Value/emergent results were called by telephone at the time of interpretation on 10/12/2012 at 02:40 a.m. to Dr. Collier Bullock, who verbally acknowledged these results.   Original Report Authenticated By: Waneta Martins, M.D.     Other results: EKG: Normal sinus rhythm, left axis deviation, poor R-wave progression noted in precordial leads  Assessment & Plan by  Problem:  Principal Problem:  *Chest pain Active Problems:  HTN (hypertension)  Diabetes mellitus  Personal history of PE (pulmonary embolism)  Protein malnutrition  Leukocytosis  Patient is a 61 year old female with past medical history of diabetes, hypertension, pulmonary embolism and splenic infarct presented with left-sided chest or back pain. Differentials include coronary artery disease versus PE. CT angiogram by the resident revealed pulmonary embolism in the right pulmonary vasculature. We plan to bridge the patient with Lovenox while starting her on Coumadin today. Patient is new to the area and needs a primary care physician. Patient's cardiac enzymes have been negative so far and also there have been no EKG changes in the morning EKG. Patient may benefit from a stress test at some point of time but I do not think that this pain is primarily cardiac in origin. We will restart patient's home medications.  Rest of the medical management as per resident's.  Lars Mage MD Pager [phone]

## 2012-10-12 NOTE — Plan of Care (Signed)
Problem: Phase I Progression Outcomes Goal: Other Phase I Outcomes/Goals Outcome: Progressing Social work consulted for difficulty obtaining home medications, adherence to regimen and other psychosocial concerns. Progression pending.

## 2012-10-13 ENCOUNTER — Ambulatory Visit (HOSPITAL_COMMUNITY): Payer: Medicare Other

## 2012-10-13 LAB — CBC
Hemoglobin: 12.5 g/dL (ref 12.0–15.0)
MCH: 27.7 pg (ref 26.0–34.0)
MCHC: 33.2 g/dL (ref 30.0–36.0)
MCV: 83.4 fL (ref 78.0–100.0)

## 2012-10-13 LAB — BASIC METABOLIC PANEL
Chloride: 105 mEq/L (ref 96–112)
Creatinine, Ser: 0.82 mg/dL (ref 0.50–1.10)
GFR calc Af Amer: 86 mL/min — ABNORMAL LOW (ref >90)
GFR calc non Af Amer: 75 mL/min — ABNORMAL LOW (ref >90)
Potassium: 3.8 mEq/L (ref 3.5–5.1)

## 2012-10-13 LAB — GLUCOSE, CAPILLARY: Glucose-Capillary: 169 mg/dL — ABNORMAL HIGH (ref 70–99)

## 2012-10-13 LAB — PROTIME-INR: Prothrombin Time: 13.3 seconds (ref 11.6–15.2)

## 2012-10-13 MED ORDER — WARFARIN SODIUM 7.5 MG PO TABS
7.5000 mg | ORAL_TABLET | Freq: Once | ORAL | Status: AC
  Administered 2012-10-13: 7.5 mg via ORAL
  Filled 2012-10-13: qty 1

## 2012-10-13 MED ORDER — INSULIN GLARGINE 100 UNIT/ML ~~LOC~~ SOLN
18.0000 [IU] | Freq: Every day | SUBCUTANEOUS | Status: DC
  Administered 2012-10-13 – 2012-10-16 (×4): 18 [IU] via SUBCUTANEOUS

## 2012-10-13 MED ORDER — LORAZEPAM 2 MG/ML IJ SOLN: 0.5000 mg | Freq: Once | INTRAMUSCULAR | Status: DC

## 2012-10-13 MED ORDER — DOCUSATE SODIUM 283 MG RE ENEM
1.0000 | ENEMA | Freq: Once | RECTAL | Status: AC
  Administered 2012-10-14: 283 mg via RECTAL
  Filled 2012-10-13 (×2): qty 1

## 2012-10-13 MED ORDER — HYDROCODONE-ACETAMINOPHEN 5-325 MG PO TABS
1.0000 | ORAL_TABLET | ORAL | Status: DC | PRN
  Administered 2012-10-13 – 2012-10-16 (×13): 1 via ORAL
  Filled 2012-10-13 (×13): qty 1

## 2012-10-13 MED ORDER — LORAZEPAM BOLUS VIA INFUSION: 0.5000 mg | Freq: Once | INTRAVENOUS | Status: DC

## 2012-10-13 MED ORDER — LORAZEPAM 2 MG/ML IJ SOLN
1.0000 mg | Freq: Once | INTRAMUSCULAR | Status: AC
  Administered 2012-10-13: 1 mg via INTRAVENOUS
  Filled 2012-10-13: qty 1

## 2012-10-13 NOTE — Progress Notes (Addendum)
Transport in room to get pt and take to MRI. Pt refuses to go to MRI because she "had one the other day". MD made aware and to come up and speak with patient. Ramond Craver, RN

## 2012-10-13 NOTE — Progress Notes (Addendum)
ANTICOAGULATION CONSULT NOTE - Follow up Consult  Pharmacy Consult for Coumadin Indication: VTE treatment (PE)  Allergies  Allergen Reactions  . Keflex (Cephalexin) Itching and Swelling    Lips and eyes swelled up  . Latex Itching  . Sulfa Antibiotics Rash  . Sulfur Rash    Patient Measurements: Height: 5' 8.9" (175 cm) Weight: 235 lb (106.595 kg) IBW/kg (Calculated) : 65.97   Vital Signs: Temp: 98.3 F (36.8 C) (10/24 0500) BP: 154/97 mmHg (10/24 0500) Pulse Rate: 98  (10/24 0500)  Labs:  Basename 10/13/12 0615 10/13/12 0500 10/12/12 0853 10/12/12 0525 10/11/12 2221 10/11/12 1604  HGB -- 12.5 -- 12.1 -- --  HCT -- 37.7 -- 36.0 -- 40.3  PLT -- 356 -- 339 -- 364  APTT -- -- -- -- -- --  LABPROT -- 13.3 -- -- -- 13.0  INR -- 1.02 -- -- -- 0.99  HEPARINUNFRC -- -- -- -- -- --  CREATININE 0.82 -- -- 0.73 -- 0.79  CKTOTAL -- -- -- -- -- --  CKMB -- -- -- -- -- --  TROPONINI -- -- <0.30 <0.30 <0.30 --    Estimated Creatinine Clearance: 91.1 ml/min (by C-G formula based on Cr of 0.82).   Assessment: 61 yo female with RLL PE (on CT angio of chest) currently on full dose Lovenox per pharmacy to continue on Coumadin (Overlap day #2).  Previously on Coumadin for hx of PE in 2011, off since 08/2012. Previously started with 7.5 mg doses. Baseline INR on admission (10/11/12) of 0.99. INR today 1.02. Hgb 12.5, plt 356 stable from yesterday.   Goal of Therapy:  INR 2-3 Monitor platelets by anticoagulation protocol: Yes   Plan:  1. Repeat Coumadin 7.5 mg PO x1 today 2. Daily PT/INR 3. Educated pt about Coumadin, provided handout   Crist Fat L 10/13/2012,10:32 AM

## 2012-10-13 NOTE — Progress Notes (Signed)
Subjective:    Interval Events:  Joy Butler continues to complain of pain the back of her chest that is 9/10 and recurs once the medication have worn off - which is usually after 3 hrs. She reports poor sleep. She say that she did not have a good sleep due to this pain.   She reports that she had chronic back pain 2 year ago which was treated by injection into her back however, she has been free of this pain for the last one year. Her pai  She is tearful and requests to be kept on this floor because she likes it better.     Objective:    Vital Signs:   Temp:  [98.3 F (36.8 C)-98.4 F (36.9 C)] 98.3 F (36.8 C) (10/24 0500) Pulse Rate:  [76-98] 98  (10/24 0500) Resp:  [18-20] 20  (10/24 0500) BP: (140-154)/(75-97) 154/97 mmHg (10/24 0500) SpO2:  [92 %-94 %] 92 % (10/24 0500) Weight:  [235 lb (106.595 kg)] 235 lb (106.595 kg) (10/24 0500) Last BM Date: 10/11/12   Weights: 24-hour Weight change: -4 lb 3.2 oz (-1.905 kg)  Filed Weights   10/12/12 0300 10/13/12 0500  Weight: 239 lb 3.2 oz (108.5 kg) 235 lb (106.595 kg)     Intake/Output:   Intake/Output Summary (Last 24 hours) at 10/13/12 0831 Last data filed at 10/12/12 2201  Gross per 24 hour  Intake      3 ml  Output      0 ml  Net      3 ml      Physical Exam: Patient is a well-developed and well-nourished morbidly obese woman in no acute distress and cooperative with exam. Alert and oriented x3.  Head: Normocephalic and atraumatic  Ear: TM normal bilaterally  Mouth: no erythema or exudates, MMM  Eyes: PERRL, EOMI, conjunctivae normal, No scleral icterus.  Neck: Supple, Trachea midline normal ROM, No JVD, mass, thyromegaly, or carotid bruit present.  Cardiovascular: RRR, S1 normal, S2 normal, no MRG, pulses symmetric and intact bilaterally, no pain to palpation over the chest wall. There is discoloration beneath the left breast but no moisture or odor.  Pulmonary/Chest: CTAB, no wheezes, rales, or rhonchi. She  demonstrated reproducible tenderness on the left side of her chest at the back. Abdominal: There is darkening of the skin over the left side of the abdomen that the patient states is unchanged for many years. Surgical scar from open Cholecystectomy noted, hysterectomy scar noted. Soft. Non-tender, non-distended, bowel sounds are normal, no masses, organomegaly, or guarding present.  GU: no CVA tenderness Musculoskeletal: No joint deformities, erythema, or stiffness, ROM full and no nontender Hematology: no cervical, inginal, or axillary adenopathy.  Neurological: A&O x3, Strength is normal and symmetric bilaterally, cranial nerve II-XII are grossly intact, no focal motor deficit, sensory intact to light touch bilaterally.  Skin: Warm, dry and intact. No rash, cyanosis, or clubbing.  Psychiatric: Normal mood and affect. speech and behavior is normal. Judgment and thought content normal. Cognition and memory are normal.   Labs: Basic Metabolic Panel:  Lab 10/13/12 9147 10/12/12 0525 10/11/12 2222 10/11/12 1604 10/11/12 1325  NA 140 141 -- 138 143  K 3.8 3.0* -- 3.8 3.6  CL 105 106 -- 102 105  CO2 27 26 -- 26 --  GLUCOSE 172* 163* -- 160* 194*  BUN 8 9 -- 10 8  CREATININE 0.82 0.73 -- 0.79 0.90  CALCIUM 8.9 8.2* -- 9.2 --  MG -- -- 1.8 -- --  PHOS -- -- -- -- --    Liver Function Tests:  Lab 17-Oct-2012 1604  AST 11  ALT <5  ALKPHOS 126*  BILITOT 0.3  PROT 7.1  ALBUMIN 2.5*   CBC:  Lab 10/13/12 0500 10/12/12 0525 2012-10-17 1604 Oct 17, 2012 1325  WBC 9.0 8.8 10.9* --  NEUTROABS -- 4.8 -- --  HGB 12.5 12.1 13.8 14.6  HCT 37.7 36.0 40.3 43.0  MCV 83.4 83.3 82.2 --  PLT 356 339 364 --    Cardiac Enzymes:  Lab 10/12/12 0853 10/12/12 0525 10-17-12 2221 10/17/12 1941  CKTOTAL -- -- -- --  CKMB -- -- -- --  CKMBINDEX -- -- -- --  TROPONINI <0.30 <0.30 <0.30 <0.30    CBG:  Lab 10/13/12 0738 10/13/12 0512 10/12/12 2108 10/12/12 1643 10/12/12 1140  GLUCAP 169* 147* 155* 220* 153*     Coagulation Studies:  Basename 10/13/12 0500 10-17-12 1604  LABPROT 13.3 13.0  INR 1.02 0.99    Microbiology: Results for orders placed during the hospital encounter of 08/31/12  CULTURE, BLOOD (ROUTINE X 2)     Status: Normal   Collection Time   09/06/12  3:10 PM      Component Value Range Status Comment   Specimen Description BLOOD RIGHT ARM   Final    Special Requests BOTTLES DRAWN AEROBIC AND ANAEROBIC 10CC   Final    Culture  Setup Time 09/06/2012 21:06   Final    Culture NO GROWTH 5 DAYS   Final    Report Status 09/12/2012 FINAL   Final   CULTURE, BLOOD (ROUTINE X 2)     Status: Normal   Collection Time   09/06/12  3:20 PM      Component Value Range Status Comment   Specimen Description BLOOD RIGHT ARM   Final    Special Requests BOTTLES DRAWN AEROBIC ONLY 10CC   Final    Culture  Setup Time 09/06/2012 21:08   Final    Culture NO GROWTH 5 DAYS   Final    Report Status 09/12/2012 FINAL   Final      Imaging: Dg Chest 2 View  Oct 17, 2012  *RADIOLOGY REPORT*  Clinical Data: Chest pain.  CHEST - 2 VIEW  Comparison: September 04, 2012.  Findings: Cardiomediastinal silhouette appears normal.  Left lung is clear.  Linear density is noted in right midlung which is unchanged compared to prior exam and most consistent with scarring. Bony thorax is intact.  IMPRESSION: No acute cardiopulmonary abnormality seen.  Right midlung scarring is again noted.   Original Report Authenticated By: Venita Sheffield., M.D.    Ct Angio Chest Pe W/cm &/or Wo Cm  10/12/2012  *RADIOLOGY REPORT*  Clinical Data: Chest pain  CT ANGIOGRAPHY CHEST  Technique:  Multidetector CT imaging of the chest using the standard protocol during bolus administration of intravenous contrast. Multiplanar reconstructed images including MIPs were obtained and reviewed to evaluate the vascular anatomy.  Contrast: OMNIPAQUE IOHEXOL 350 MG/ML SOLN  Comparison: 10-17-2012 radiograph, 09/02/2012 abdominal CT  Findings:  There is a branching pulmonary arterial filling defect within the the right lower lobe pulmonary artery, extending into proximal segmental branches.  The upper lobe arterial branches are inadequately opacified.  I do not see a filling defect within the left lower lobe branches.  Heart size is upper normal to mildly enlarged. No overt evidence for right ventricular heart strain.  Aortic valve and coronary artery calcifications.  No pleural or pericardial effusion. No intrathoracic lymphadenopathy.  Respiratory motion degrades detailed parenchymal evaluation.  There are areas of mosaic attenuation, favored to reflect air trapping and atelectasis.  No pneumothorax.  Linear left lung base opacity is favored to reflect scarring or atelectasis.  Limited imaging through the upper abdomen shows areas of hypoattenuation within the spleen, decreased in size from the prior, favored to reflect sequelae of prior splenic infarctions.  IMPRESSION: Right lower lobe pulmonary embolism.  No overt CT evidence for right ventricular heart strain.  Evolving splenic infarctions.  Critical Value/emergent results were called by telephone at the time of interpretation on 10/12/2012 at 02:40 a.m. to Dr. Collier Bullock, who verbally acknowledged these results.   Original Report Authenticated By: Waneta Martins, M.D.       Medications:    Infusions:     Scheduled Medications:    . amLODipine  10 mg Oral Daily  . aspirin EC  81 mg Oral Daily  . coumadin book   Does not apply Once  . enoxaparin (LOVENOX) injection  110 mg Subcutaneous BID  . insulin aspart  0-15 Units Subcutaneous TID WC  . insulin glargine  15 Units Subcutaneous QHS  . losartan  100 mg Oral Daily  . nicotine  14 mg Transdermal Daily  . pantoprazole  40 mg Oral Daily  . polyethylene glycol powder  17 g Oral Daily  . potassium chloride  40 mEq Oral Q4H  . sodium chloride  3 mL Intravenous Q12H  . warfarin  7.5 mg Oral ONCE-1800  . warfarin   Does not apply  Once  . Warfarin - Pharmacist Dosing Inpatient   Does not apply q1800  . DISCONTD: insulin glargine  10 Units Subcutaneous QHS     PRN Medications: feeding supplement, morphine injection, nitroGLYCERIN, ondansetron (ZOFRAN) IV, ondansetron, oxyCODONE   Assessment/ Plan:   Pat is a 61 year old woman with a PMH significant for Diabetes, hypertension, PE, and recent Splenic infarct who presents because of left sided aching chest pain for 24 hours.  Right lower lobe PE: She has a history of PE in 2011 and her modified Geneva score was 6 which is intermediate probability with points for a previous PE and her pulse rate >75. Chest CT angiogram confirmed a right lower lobe PE. The patient reports that in 2011, she was managed with Coumadin, but she was unable to continue with followup with INR and therefore stopped therapy. She does not report other risk factors for pulmonary embolism like swelling of a leg, or recent long distance travel. In September 2013, she had an extensive workup for coagulopathies including protein and protein, S, homocysteine, Beta -2 glycoprotein, factor V Leyden, antithrombin III which were all negative. She had decreased levels of anticardiolipin, IgG and IgM. Her lupus anticoagulant panel indicated high values and protein C activity was elevated at 190% Plan - to start indefinite anticoagulation given her history of recurrent thromboembolism.  - She has been started on oral Coumadin as per pharmacy with heparin overlap. Once therapeutic INR is achieved and maintained for at least 24hours she'll be discharged on oral Coumadin, to followup with her regular INR checks as outpatient. -Patient will need outpatient followup with internal medicine clinic as she does not have a PCP. -Continue following up INR - 1.02 today  Left Chest pain (resolved): The cause of this patient's chest pain is still elusive. She now described the chest pain as dull and aching and has migrated from  the left anterior to the posterior side still in the left  side. She has significant risk factors for coronary artery disease, including cigarette smoking, hypertension, diabetes, hyperlipidemia and obesity however her chest pain is very atypical. The most likely cause of this chest pain is splenic infarction as opposed to a cardiac cause. She denies other possible causes of this chest pain, including pancreatitis-she does not report nausea or vomiting, and she does not have any risk factors for pancreatitis like alcohol ingestion. The level of her hypertriglyceridemia (239) is not significant enough (<1000) to cause pancreatitis as most pancreatitis. Aortic dissection would have been identified on chest CT angiogram. Chest x-ray was normal. White blood cell count has normalized from 10.9-8.8 without any antibiotics, which further excludes pneumonia. Plan - will continue to monitor clinically  Diabetes Mellitius: The patient is managed on Lantus 15 U. On admission she was restarted Lantus at 10 UI with a meal coverage. Her blood sugars continue to remain elevated at 150s to 160s. She also reports that she has peripheral neuropathy as a complication of diabetes. Plan - increased with Lantus from 15 to 18 IU at bed time - SSI  -Continue to monitor CBGs.  Peripheral neuropathies: Patient reports that she has had a long history of peripheral neuropathy. She reports that her doctor advised her to walk from [place] to the [place] that the warm weather there would improve her peripheral neuropathy. However, she has continued experiencing peripheral neuropathies involving the feet in the socks distribution. She also reports that her legs sometimes give way with a recent history of a fall. Plan -This patient would benefit from pregabalin however, the cost might be prohibitive. I would, therefore recommend a more affordable option that could be effective like amitriptyline starting at 12.5 mg at night.   Thoracic  Spine Disease: MRI done on 9/16 showed Abnormal right paraspinal/intercostal process centered at the  T8 level and moderate to severe degenerative disease at the T10-T11. This was felt to be 2/2 to thrombophlebitis but infectious process could not be excluded. The patient was afebrile and did not have a leukocytosis. Radiology recommended a repeat  thoracic MRI (without and with contrast preferred) in 10-14 days but the patient did not follow up with this - possibly due to cost. Clinically it is difficult to determine whether this pain is coming from her spine versus musculoskeletal.   Plan -MRI of the back to evaluate progression of thoracic spine disease.  HTN: She is hypertensive today and states that she has been out of her medications for at least 2 weeks. BP is still elevated in teh ranges if upper 150's over 80-90.  Plan -Continue with Losartan, Amlodipine - we will consider restarting her on her home HCTZ, if the blood pressures remain uncontrolled.  Smoking: Joy. Cacioppo reports history of smoking about 6-7 cigarettes per day. In the past quit for 2 years. However, she relapsed over the last year. She has desire to quit with assistance. She is agreeable to a nicotine patch. Plan -Nicotine patch.  Low material resources: She has been intermittently homeless over the last two months since moving to [facility] but recently found a place to live but per her report it did not come with appliances which they are saving to try to afford. She has not been able to afford her medications on her fixed income but has [employer] to help her pay for her medications.  - Consult CSW to see if there are any resources that we can tap into to help her afford her medications.  Length of Stay: 2 days   Signed by:  Dow Adolph PGY-I, Internal Medicine Pager [phone] 10/13/2012, 8:31 AM

## 2012-10-13 NOTE — Progress Notes (Signed)
Pt brought down to department for MRI, was sedated prior to coming down and was extremely sleepy.  Got pt on table using techniques we use for claustro pts and she did well until about 3/4 of the way through exam then pt stated she wanted to come out and didn't want to continue with exam at all.  Images acquired were turned in and could still have diagnostic use.   Nurse notified and pt was sent back up to floor by transport team.

## 2012-10-13 NOTE — Progress Notes (Signed)
Internal Medicine Teaching Service Attending Note Date: 10/13/2012  Patient name: Joy Butler  Medical record number: [id number]  Date of birth: 02/23/1951    This patient has been seen and discussed with the house staff. Please see their note for complete details. I concur with their findings with the following additions/corrections: Patient continues to have left sided back pain described as aching, 10/10, non radiating and persistent. BP 154/97  Pulse 98  Temp 98.3 F (36.8 C) (Oral)  Resp 20  Ht 5' 8.9" (1.75 m)  Wt 235 lb (106.595 kg)  BMI 34.81 kg/m2  SpO2 92% Physical exam is c/w tenderness to palpation over left paraspinal muscles around thoracic spine region. No abdominal pain noted. No rashes noted. Plan: Continue to treat for PE. Obtain MRI thoracic spine with contrast as suggested by the previous read of MRI(september) to 1. Follow up the previous lesion seen on MRI and 2. Evaluate the cause of chest pain(radiculopathy from spinal nerve compression)  Raeshaun Simson 10/13/2012, 1:18 PM Pager [phone]

## 2012-10-13 NOTE — Progress Notes (Signed)
Pt had a 10 beat run on nonsustained vtach. Pt asymptomatic at the time. VSS. Pt only complaint is L sided back pain. Dr. Joaquim Lai verbally notified. Continue order to d/c tele. Heart monitor cleaned and returned to front. Will continue to monitor patient closely. Ramond Craver, Rn

## 2012-10-14 LAB — BASIC METABOLIC PANEL
BUN: 8 mg/dL (ref 6–23)
CO2: 25 mEq/L (ref 19–32)
GFR calc non Af Amer: 71 mL/min — ABNORMAL LOW (ref >90)
Glucose, Bld: 174 mg/dL — ABNORMAL HIGH (ref 70–99)
Potassium: 3.7 mEq/L (ref 3.5–5.1)

## 2012-10-14 LAB — SEDIMENTATION RATE: Sed Rate: 73 mm/hr — ABNORMAL HIGH (ref 0–22)

## 2012-10-14 LAB — GLUCOSE, CAPILLARY
Glucose-Capillary: 169 mg/dL — ABNORMAL HIGH (ref 70–99)
Glucose-Capillary: 179 mg/dL — ABNORMAL HIGH (ref 70–99)

## 2012-10-14 MED ORDER — WARFARIN SODIUM 10 MG PO TABS
10.0000 mg | ORAL_TABLET | Freq: Once | ORAL | Status: AC
  Administered 2012-10-14: 10 mg via ORAL
  Filled 2012-10-14: qty 1

## 2012-10-14 MED ORDER — INFLUENZA VIRUS VACC SPLIT PF IM SUSP: 0.5000 mL | Freq: Once | INTRAMUSCULAR | Status: DC

## 2012-10-14 NOTE — Consult Note (Signed)
Reason for Consult:  Right paraspinal inflammatory process Referring Physician:  Dr. Terri Piedra (internal medicine teaching service attending)  Joy Butler is an 61 y.o. female.   HPI:  Patient is a 61 year old right-handed black female who is readmitted to the internal medicine teaching service at [facility] after a recent hospitalization last month (September), and at another hospitalization the preceding month (August). Both of those admissions were precipitated by abdominal pain, and she was found at the time of the second admission to have developed a splenic infarct. She underwent extensive hypercoagulable workup on the triad hospitalist service. Additional workup of her spine included MRIs of the thoracic and lumbar spine, which showed degenerative changes, but the thoracic MRI did show an unusual area of apparent inflammation in the right T8 paraspinal region, the nature of which was uncertain but the interpreting neuroradiologist Dr. Augusto Gamble felt changes might be consistent with thrombophlebitis of the intercostal vessels. Notably the patient had no fever, nor leukocytosis. Patient was seen in infectious disease consultation by Dr. Enedina Finner who did not feel that there was a clear infectious etiology. Neurosurgical consultation was obtained at that time it is felt that there is no involvement of the disc space, no evidence of discitis, epidural abscess, encroachment on the spinal canal, or impingement of the spinal cord or nerve roots.  The patient presented 3 days ago with complaints of left-sided chest pain, involving both the anterior and posterior left lateral chest wall. She underwent a workup including CT angiogram of the chest; the report describes right lower lobe pulmonary embolism, and does not describe any left sided pulmonary embolism. Patient is being anticoagulated by the internal medicine teaching service with Lovenox and Coumadin.  MRI of the thoracic spine was  repeated yesterday and this repeat study was again interpreted by neuroradiologist, Dr. Augusto Gamble. Unfortunately this study as of limited interpretable value since only sagittal and coronal imaging was performed, and no axial imaging was performed. Dr. Margo Aye felt that there was mild progression of the right paraspinal inflammatory process centered at the T8 level. He felt that an indolent infection could not be excluded. However he did not feel that the appearance was not consistent with typical MRI changes associated with spinal osteomyelitis, and he felt changes could still be degenerative rather than infectious.  Neurosurgical consultation was requested for further recommendations.  In speaking with the patient, she describes some discomfort in the left anterior and posterior lateral chest wall, but no midline posterior thoracic discomfort nor discomfort on the right side of the chest.  Past Medical History:  Past Medical History  Diagnosis Date  . Diabetes mellitus     diagnosed in 2000.    Marland Kitchen Hypertension   . Pulmonary embolism     2011 treated at [facility] in [place].  Was on Coumadin for over a year.  no known family history  . UTI (urinary tract infection)   . High cholesterol     Past Surgical History:  Past Surgical History  Procedure Date  . Abdominal hysterectomy   . Cholecystectomy     1980  . Esophagogastroduodenoscopy 08/19/2012    Procedure: ESOPHAGOGASTRODUODENOSCOPY (EGD);  Surgeon: Vertell Novak., MD;  Location: [facility] ENDOSCOPY;  Service: Endoscopy;  Laterality: N/A;    Family History:  Family History  Problem Relation Age of Onset  . Adopted: Yes  . Family history unknown: Yes    Social History:  reports that she has been smoking Cigarettes.  She  has a 2.5 pack-year smoking history. She has never used smokeless tobacco. She reports that she does not drink alcohol or use illicit drugs.  Allergies:  Allergies  Allergen Reactions  . Keflex (Cephalexin) Itching  and Swelling    Lips and eyes swelled up  . Latex Itching  . Sulfa Antibiotics Rash  . Sulfur Rash    Medications: I have reviewed the patient's current medications.  Results for orders placed during the hospital encounter of 10/11/12 (from the past 48 hour(s))  GLUCOSE, CAPILLARY     Status: Abnormal   Collection Time   10/12/12  9:08 PM      Component Value Range Comment   Glucose-Capillary 155 (*) 70 - 99 mg/dL   CBC     Status: Normal   Collection Time   10/13/12  5:00 AM      Component Value Range Comment   WBC 9.0  4.0 - 10.5 K/uL    RBC 4.52  3.87 - 5.11 MIL/uL    Hemoglobin 12.5  12.0 - 15.0 g/dL    HCT 28.4  13.2 - 44.0 %    MCV 83.4  78.0 - 100.0 fL    MCH 27.7  26.0 - 34.0 pg    MCHC 33.2  30.0 - 36.0 g/dL    RDW 10.2  72.5 - 36.6 %    Platelets 356  150 - 400 K/uL   PROTIME-INR     Status: Normal   Collection Time   10/13/12  5:00 AM      Component Value Range Comment   Prothrombin Time 13.3  11.6 - 15.2 seconds    INR 1.02  0.00 - 1.49   GLUCOSE, CAPILLARY     Status: Abnormal   Collection Time   10/13/12  5:12 AM      Component Value Range Comment   Glucose-Capillary 147 (*) 70 - 99 mg/dL   BASIC METABOLIC PANEL     Status: Abnormal   Collection Time   10/13/12  6:15 AM      Component Value Range Comment   Sodium 140  135 - 145 mEq/L    Potassium 3.8  3.5 - 5.1 mEq/L    Chloride 105  96 - 112 mEq/L    CO2 27  19 - 32 mEq/L    Glucose, Bld 172 (*) 70 - 99 mg/dL    BUN 8  6 - 23 mg/dL    Creatinine, Ser 4.40  0.50 - 1.10 mg/dL    Calcium 8.9  8.4 - 34.7 mg/dL    GFR calc non Af Amer 75 (*) >90 mL/min    GFR calc Af Amer 86 (*) >90 mL/min   GLUCOSE, CAPILLARY     Status: Abnormal   Collection Time   10/13/12  7:38 AM      Component Value Range Comment   Glucose-Capillary 169 (*) 70 - 99 mg/dL   GLUCOSE, CAPILLARY     Status: Abnormal   Collection Time   10/13/12 11:33 AM      Component Value Range Comment   Glucose-Capillary 165 (*) 70 - 99  mg/dL   GLUCOSE, CAPILLARY     Status: Abnormal   Collection Time   10/13/12  4:11 PM      Component Value Range Comment   Glucose-Capillary 172 (*) 70 - 99 mg/dL    Comment 1 Notify RN     GLUCOSE, CAPILLARY     Status: Abnormal   Collection Time   10/13/12  8:58 PM      Component Value Range Comment   Glucose-Capillary 168 (*) 70 - 99 mg/dL   PROTIME-INR     Status: Normal   Collection Time   10/14/12  6:10 AM      Component Value Range Comment   Prothrombin Time 13.7  11.6 - 15.2 seconds    INR 1.06  0.00 - 1.49   BASIC METABOLIC PANEL     Status: Abnormal   Collection Time   10/14/12  6:10 AM      Component Value Range Comment   Sodium 139  135 - 145 mEq/L    Potassium 3.7  3.5 - 5.1 mEq/L    Chloride 104  96 - 112 mEq/L    CO2 25  19 - 32 mEq/L    Glucose, Bld 174 (*) 70 - 99 mg/dL    BUN 8  6 - 23 mg/dL    Creatinine, Ser 1.61  0.50 - 1.10 mg/dL    Calcium 8.9  8.4 - 09.6 mg/dL    GFR calc non Af Amer 71 (*) >90 mL/min    GFR calc Af Amer 83 (*) >90 mL/min   GLUCOSE, CAPILLARY     Status: Abnormal   Collection Time   10/14/12  7:48 AM      Component Value Range Comment   Glucose-Capillary 145 (*) 70 - 99 mg/dL    Comment 1 Notify RN     GLUCOSE, CAPILLARY     Status: Abnormal   Collection Time   10/14/12 11:29 AM      Component Value Range Comment   Glucose-Capillary 169 (*) 70 - 99 mg/dL    Comment 1 Notify RN     SEDIMENTATION RATE     Status: Abnormal   Collection Time   10/14/12  3:00 PM      Component Value Range Comment   Sed Rate 73 (*) 0 - 22 mm/hr   GLUCOSE, CAPILLARY     Status: Abnormal   Collection Time   10/14/12  4:33 PM      Component Value Range Comment   Glucose-Capillary 179 (*) 70 - 99 mg/dL    Comment 1 Notify RN       Mr Thoracic Spine Wo Contrast  10/14/2012  *RADIOLOGY REPORT*  Clinical Data: 61 year old female with history of right flank pain, hypercoagulable state with splenic infarcts, abnormal right paraspinal process at T8.   The patient was recently readmitted and found have right side pulmonary emboli.  She presents for reevaluation of the thoracic spine.  The examination had to be discontinued prior to completion due to patient claustrophobia, despite premedication.Subsequently, no postcontrast images were obtained.  MRI THORACIC SPINE WITHOUT CONTRAST  Technique:  Multiplanar and multiecho pulse sequences of the thoracic spine were obtained without intravenous contrast.  Comparison: CTA chest 10/12/2012.  Thoracic MRI 09/05/2012.  Findings: No axial images were obtained.  Continued abnormal STIR signal along the right lateral aspect of the T8 and T9 vertebral bodies.  As before, the abnormal signal is in the vicinity of large right lateral endplate osteophytes, which themselves main shows some marrow edema.  There is continued mild endplate marrow edema laterally on the right at the T8-T9 level. Signal in the intervening disc remains normal.  The T8 vertebral body/endplate edema appears mildly increased.  There has been mild progression in the extent of the right paraspinal signal, which now extends caudally to the inferior T10 level (series 6 image 1 on today's  study versus series 8 image 2 on the comparison).  Comparing the findings to the recent CTA, there is mild right paraspinal soft tissue stranding on that study.  Neither exam shows convincing evidence of drainable fluid.  Trace right pleural effusion present today is new / increased.  Stable thoracic vertebral height and alignment.  Marrow signal at other levels is stable and within normal limits.  The no axial or postcontrast images were obtained, but the thoracic spinal cord appears stable and without definite signal abnormality.  Limited cervical spine sagittal imaging again demonstrates multilevel cervical spondylosis.  IMPRESSION: 1. The examination had to be discontinued prior to completion due to claustrophobia, despite premedication. 2.  Mild progression since last  month of the right paraspinal inflammatory process centered at the T8 level.  The area of abnormal MRI signal is seen to correspond to mild paraspinal fat stranding on the recent CTA. No drainable or organized fluid collection. 3.  Interval mildly increased and endplate and mild vertebral body marrow edema at T8-T9, while signal in the intervening disc remains normal. 4. Given these constellation of findings, an indolent or smoldering right paraspinal infection is a concern, particularly given the patient's history of diabetes. But as before the appearance, and the mild progression over a months time while left untreated, is NOT typical of spinal osteomyelitis.  And it is still possible that the findings are degenerative rather than infectious, but the progression of the signal changes is worrisome. 5.  Trace right pleural effusion, probably related to the recently diagnosed right pulmonary embolus.   Original Report Authenticated By: Harley Hallmark, M.D.     Physical examination: Patient is obese black female, resting comfortably in bed, in no acute distress.  Blood pressure 128/73, pulse 77, temperature 98.3 F (36.8 C), temperature source Oral, resp. rate 18, height 5' 8.9" (1.75 m), weight 106.142 kg (234 lb), SpO2 96.00%.  Musculoskeletal examination shows no tenderness to palpation or percussion over the thoracic spinous processes or parathoracic musculature. Motor examination shows 5 over 5 strength in the lower extremities including the iliopsoas, quadriceps, dorsiflexor, EHL, and plantar flexor. Sensation is intact to pinprick in the distal lower extremities. Reflexes are absent at the quadriceps and gastrocnemius, they're symmetrical, toes are downgoing bilaterally.   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 But also including diabetes mellitus and  hypertension.  The patient's been found to have an unusual right paraspinal inflammatory process, centered about the T8 level, the nature of which is uncertain. There is no evidence of discitis. The appearance is not typical for spinal osteomyelitis. There is certainly no encroachment on the spinal canal, nor any encroachment or impingement of the thecal sac or spinal cord. She has intact spinal cord function.  There is no indication for neurosurgical intervention, nor do I have any greater insight into the nature of this condition than the many other physicians who've evaluated this case including Dr. Augusto Gamble the interpreting neuroradiologist, Dr. [name] the infectious disease consultant, and the physicians on the [facility] hospital internal medicine teaching service and the triad hospitalist service.  Workup for the question of an infectious process would need to be done following the recommendations of the infectious disease consultant. There is no neurosurgical approach to this region, rather if a surgical approach was necessary, it would require thoracic surgery, since the process lies between the pleura and the spine. A decision of whether to proceed  with further infectious disease workup and/or empiric antibiotic treatment is deferred to the treating [facility] internal medicine teaching service and the infectious disease consultant.  There is no indication for further neurosurgical followup, but certainly she is going to need to continue inpatient care with the internal medicine teaching service, and continued outpatient followup and care with the internal medicine teaching service regarding her multiple active medical problems.  Hewitt Shorts, MD 10/14/2012, 7:51 PM

## 2012-10-14 NOTE — Progress Notes (Addendum)
Subjective:    Interval Events:  Joy Butler repots that her back pain has improved but not completely resolved. No overnight events. No new complaints. I have explained to her that we need to have neurosurgery's opinion regarding the finding from the MRI of the back.    Objective:    Vital Signs:   Temp:  [97.8 F (36.6 C)-98.3 F (36.8 C)] 98.3 F (36.8 C) (10/25 1400) Pulse Rate:  [75-94] 77  (10/25 1400) Resp:  [18] 18  (10/25 1400) BP: (128-163)/(73-88) 128/73 mmHg (10/25 1400) SpO2:  [96 %-100 %] 96 % (10/25 1400) Weight:  [234 lb (106.142 kg)] 234 lb (106.142 kg) (10/25 0500) Last BM Date: 10/11/12   Weights: 24-hour Weight change: -1 lb (-0.454 kg)  Filed Weights   10/12/12 0300 10/21/12 0500 10/14/12 0500  Weight: 239 lb 3.2 oz (108.5 kg) 235 lb (106.595 kg) 234 lb (106.142 kg)     Physical Exam: Patient is a well-developed and well-nourished morbidly obese woman in no acute distress and cooperative with exam. Alert and oriented x3.  Cardiovascular: RRR, S1 normal, S2 normal, no MRG, pulses symmetric and intact bilaterally, no pain to palpation over the chest wall.  Pulmonary/Chest: CTAB, no wheezes, rales, or rhonchi.  Abdominal: There is darkening of the skin over the left side of the abdomen that the patient states is unchanged for many years. Surgical scar from open Cholecystectomy noted, hysterectomy scar noted. Soft. Non-tender, non-distended, bowel sounds are normal, no masses, organomegaly, or guarding present.  Musculoskeletal: No joint deformities, erythema, or stiffness, ROM full and no nontender. She demonstrates tenderness in around the left infrascapular region.  Neurological: A&O x3, Strength is normal and symmetric bilaterally, cranial nerve II-XII are grossly intact, no focal motor deficit, sensory intact to light touch bilaterally.   Labs: Basic Metabolic Panel:  Lab 10/14/12 4098 10-21-2012 0615 10/12/12 0525 10/11/12 2222 10/11/12 1604 10/11/12  1325  NA 139 140 141 -- 138 143  K 3.7 3.8 3.0* -- 3.8 3.6  CL 104 105 106 -- 102 105  CO2 25 27 26  -- 26 --  GLUCOSE 174* 172* 163* -- 160* 194*  BUN 8 8 9  -- 10 8  CREATININE 0.85 0.82 0.73 -- 0.79 0.90  CALCIUM 8.9 8.9 8.2* -- -- --  MG -- -- -- 1.8 -- --  PHOS -- -- -- -- -- --    Liver Function Tests:  Lab 10/11/12 1604  AST 11  ALT <5  ALKPHOS 126*  BILITOT 0.3  PROT 7.1  ALBUMIN 2.5*   CBC:  Lab Oct 21, 2012 0500 10/12/12 0525 10/11/12 1604 10/11/12 1325  WBC 9.0 8.8 10.9* --  NEUTROABS -- 4.8 -- --  HGB 12.5 12.1 13.8 14.6  HCT 37.7 36.0 40.3 43.0  MCV 83.4 83.3 82.2 --  PLT 356 339 364 --    Cardiac Enzymes:  Lab 10/12/12 0853 10/12/12 0525 10/11/12 2221 10/11/12 1941  CKTOTAL -- -- -- --  CKMB -- -- -- --  CKMBINDEX -- -- -- --  TROPONINI <0.30 <0.30 <0.30 <0.30    CBG:  Lab 10/14/12 1633 10/14/12 1129 10/14/12 0748 10/21/12 2058 Oct 21, 2012 1611  GLUCAP 179* 169* 145* 168* 172*    Coagulation Studies:  Basename 10/14/12 0610 21-Oct-2012 0500  LABPROT 13.7 13.3  INR 1.06 1.02    Microbiology: Results for orders placed during the hospital encounter of 08/31/12  CULTURE, BLOOD (ROUTINE X 2)     Status: Normal   Collection Time   09/06/12  3:10 PM      Component Value Range Status Comment   Specimen Description BLOOD RIGHT ARM   Final    Special Requests BOTTLES DRAWN AEROBIC AND ANAEROBIC 10CC   Final    Culture  Setup Time 09/06/2012 21:06   Final    Culture NO GROWTH 5 DAYS   Final    Report Status 09/12/2012 FINAL   Final   CULTURE, BLOOD (ROUTINE X 2)     Status: Normal   Collection Time   09/06/12  3:20 PM      Component Value Range Status Comment   Specimen Description BLOOD RIGHT ARM   Final    Special Requests BOTTLES DRAWN AEROBIC ONLY 10CC   Final    Culture  Setup Time 09/06/2012 21:08   Final    Culture NO GROWTH 5 DAYS   Final    Report Status 09/12/2012 FINAL   Final      Imaging: Mr Thoracic Spine Wo Contrast  10/14/2012   *RADIOLOGY REPORT*  Clinical Data: 61 year old female with history of right flank pain, hypercoagulable state with splenic infarcts, abnormal right paraspinal process at T8.  The patient was recently readmitted and found have right side pulmonary emboli.  She presents for reevaluation of the thoracic spine.  The examination had to be discontinued prior to completion due to patient claustrophobia, despite premedication.Subsequently, no postcontrast images were obtained.  MRI THORACIC SPINE WITHOUT CONTRAST  Technique:  Multiplanar and multiecho pulse sequences of the thoracic spine were obtained without intravenous contrast.  Comparison: CTA chest 10/12/2012.  Thoracic MRI 09/05/2012.  Findings: No axial images were obtained.  Continued abnormal STIR signal along the right lateral aspect of the T8 and T9 vertebral bodies.  As before, the abnormal signal is in the vicinity of large right lateral endplate osteophytes, which themselves main shows some marrow edema.  There is continued mild endplate marrow edema laterally on the right at the T8-T9 level. Signal in the intervening disc remains normal.  The T8 vertebral body/endplate edema appears mildly increased.  There has been mild progression in the extent of the right paraspinal signal, which now extends caudally to the inferior T10 level (series 6 image 1 on today's study versus series 8 image 2 on the comparison).  Comparing the findings to the recent CTA, there is mild right paraspinal soft tissue stranding on that study.  Neither exam shows convincing evidence of drainable fluid.  Trace right pleural effusion present today is new / increased.  Stable thoracic vertebral height and alignment.  Marrow signal at other levels is stable and within normal limits.  The no axial or postcontrast images were obtained, but the thoracic spinal cord appears stable and without definite signal abnormality.  Limited cervical spine sagittal imaging again demonstrates multilevel  cervical spondylosis.  IMPRESSION: 1. The examination had to be discontinued prior to completion due to claustrophobia, despite premedication. 2.  Mild progression since last month of the right paraspinal inflammatory process centered at the T8 level.  The area of abnormal MRI signal is seen to correspond to mild paraspinal fat stranding on the recent CTA. No drainable or organized fluid collection. 3.  Interval mildly increased and endplate and mild vertebral body marrow edema at T8-T9, while signal in the intervening disc remains normal. 4. Given these constellation of findings, an indolent or smoldering right paraspinal infection is a concern, particularly given the patient's history of diabetes. But as before the appearance, and the mild progression over a months time  while left untreated, is NOT typical of spinal osteomyelitis.  And it is still possible that the findings are degenerative rather than infectious, but the progression of the signal changes is worrisome. 5.  Trace right pleural effusion, probably related to the recently diagnosed right pulmonary embolus.   Original Report Authenticated By: Harley Hallmark, M.D.       Medications:    Infusions:     Scheduled Medications:    . amLODipine  10 mg Oral Daily  . aspirin EC  81 mg Oral Daily  . docusate sodium  1 enema Rectal Once  . enoxaparin (LOVENOX) injection  110 mg Subcutaneous BID  . insulin aspart  0-15 Units Subcutaneous TID WC  . insulin glargine  18 Units Subcutaneous QHS  . losartan  100 mg Oral Daily  . nicotine  14 mg Transdermal Daily  . pantoprazole  40 mg Oral Daily  . polyethylene glycol powder  17 g Oral Daily  . sodium chloride  3 mL Intravenous Q12H  . warfarin  10 mg Oral ONCE-1800  . Warfarin - Pharmacist Dosing Inpatient   Does not apply q1800  . DISCONTD: influenza  inactive virus vaccine  0.5 mL Intramuscular Once     PRN Medications: feeding supplement, HYDROcodone-acetaminophen, ondansetron  (ZOFRAN) IV, ondansetron   Assessment/ Plan:   Joy Butler is a 61 year old woman with a PMH significant for Diabetes, hypertension, PE, and recent Splenic infarct who presents because of left sided aching chest pain for 24 hours.  Right lower lobe PE: Mr Noy has a history of PE in 2011 and her modified Geneva score was 6 which is intermediate probability with points for a previous PE and her pulse rate >75. Chest CT angiogram performed on addition confirmed a right lower lobe PE. The patient reports that in 2011, she was managed with Coumadin, but she was unable to continue with followup with INR and therefore stopped therapy. She does not report other risk factors for pulmonary embolism like swelling of a leg, or recent long distance travel. In September 2013, she had an extensive workup for coagulopathies including protein and protein, S, homocysteine, Beta -2 glycoprotein, factor V Leyden, antithrombin III which were all negative. She had decreased levels of anticardiolipin, IgG and IgM. Her lupus anticoagulant panel indicated high values and protein C activity was elevated at 190%. She does not report any SOB or chest pain.  Plan - This is day day 4 of coumadin and Lovenox overlap therapy. INR is still subtherapeutic at 1.06 - Aim for indefinite anticoagulation given recurrent PEs   Thoracic Spine Disease: MRI done on 9/16 showed Abnormal right paraspinal/intercostal process centered at the  T8 level and moderate to severe degenerative disease at the T10-T11. This was felt to be 2/2 to thrombophlebitis but infectious process could not be excluded. The patient was afebrile and did not have a leukocytosis. Radiology recommended a repeat  thoracic MRI (without and with contrast preferred) in 10-14 days but the patient did not follow up with this - possibly due to cost. Clinically it is difficult to determine whether this pain is coming from her spine versus musculoskeletal. Repeat MRI of the thoracic  spine indicate some progression her paraspinal diseases compared to last month. It is unclear whether this is as infectious process. She denies fevers or chills. She has been afebrile since admission. No leukocytosis.   Plan - Awaiting neurosurgery evaluation - Discussed with Dr Lovell Sheehan on phone. He favors an infectious process and he  recommends ESR, blood cultures and ID consult as opposed to surgical intervention.  - Blood culture and ESR have been ordered - will ID consultation tomorrow.   Left Chest pain (resolved): The cause of this patient's chest pain is still elusive. She now described the chest pain as dull and aching and has migrated from the left anterior to the posterior side still in the left side. She has significant risk factors for coronary artery disease, including cigarette smoking, hypertension, diabetes, hyperlipidemia and obesity however her chest pain is very atypical. The most likely cause of this chest pain is splenic infarction as opposed to a cardiac cause. She denies other possible causes of this chest pain, including pancreatitis-she does not report nausea or vomiting, and she does not have any risk factors for pancreatitis like alcohol ingestion. The level of her hypertriglyceridemia (239) is not significant enough (<1000) to cause pancreatitis as most pancreatitis. Aortic dissection would have been identified on chest CT angiogram. Chest x-ray was normal. White blood cell count has normalized from 10.9-8.8 without any antibiotics, which further excludes pneumonia. Plan - will continue to monitor clinically  Diabetes Mellitius: The patient is managed on Lantus 15 U. On admission she was restarted Lantus at 10 UI with a meal coverage. Her blood sugars continue to remain elevated in 170s.  Plan - increased with Lantus from 18 to 20 IU at bed time - SSI  -Continue to monitor CBGs.  Peripheral neuropathies: Patient reports that she has had a long history of peripheral  neuropathy. She reports that her doctor advised her to walk from [place] to the [place] that the warm weather there would improve her peripheral neuropathy. However, she has continued experiencing peripheral neuropathies involving the feet in the socks distribution. She also reports that her legs sometimes give way with a recent history of a fall. Plan -This patient would benefit from pregabalin however, the cost might be prohibitive. I would, therefore recommend a more affordable option that could be effective like amitriptyline starting at 12.5 mg at night.   HTN: She is hypertensive today and states that she has been out of her medications for at least 2 weeks. BP is better today in the range of 145/85 Plan -Continue with Losartan, Amlodipine - we will consider restarting her on her home HCTZ, if the blood pressures remain uncontrolled.  Smoking: Joy. Butler reports history of smoking about 6-7 cigarettes per day. In the past quit for 2 years. However, she relapsed over the last year. She has desire to quit with assistance.  Plan - continue with Nicotine patch.  Low material resources: She has been intermittently homeless over the last two months since moving to [facility] but recently found a place to live but per her report it did not come with appliances which they are saving to try to afford. She has not been able to afford her medications on her fixed income but has [employer] to help her pay for her medications.  - Consult CSW to see if there are any resources that we can tap into to help her afford her medications.   Length of Stay: 3 days   Signed by:  Dow Adolph PGY-I, Internal Medicine Pager [phone] 10/14/2012, 6:06 PM

## 2012-10-14 NOTE — Progress Notes (Signed)
ANTICOAGULATION CONSULT NOTE - Follow Up Consult  Pharmacy Consult for Coumadin Indication: pulmonary embolus  Allergies  Allergen Reactions  . Keflex (Cephalexin) Itching and Swelling    Lips and eyes swelled up  . Latex Itching  . Sulfa Antibiotics Rash  . Sulfur Rash    Patient Measurements: Height: 5' 8.9" (175 cm) Weight: 234 lb (106.142 kg) IBW/kg (Calculated) : 65.97  Heparin Dosing Weight:  Vital Signs: Temp: 97.8 F (36.6 C) (10/25 0500) Temp src: Oral (10/25 0500) BP: 145/85 mmHg (10/25 0946) Pulse Rate: 83  (10/25 0946)  Labs:  Alvira Philips 10/14/12 0610 10/13/12 0615 10/13/12 0500 10/12/12 0853 10/12/12 0525 10/11/12 2221 10/11/12 1604  HGB -- -- 12.5 -- 12.1 -- --  HCT -- -- 37.7 -- 36.0 -- 40.3  PLT -- -- 356 -- 339 -- 364  APTT -- -- -- -- -- -- --  LABPROT 13.7 -- 13.3 -- -- -- 13.0  INR 1.06 -- 1.02 -- -- -- 0.99  HEPARINUNFRC -- -- -- -- -- -- --  CREATININE 0.85 0.82 -- -- 0.73 -- --  CKTOTAL -- -- -- -- -- -- --  CKMB -- -- -- -- -- -- --  TROPONINI -- -- -- <0.30 <0.30 <0.30 --    Estimated Creatinine Clearance: 87.7 ml/min (by C-G formula based on Cr of 0.85).   Medications:  Scheduled:    . amLODipine  10 mg Oral Daily  . aspirin EC  81 mg Oral Daily  . docusate sodium  1 enema Rectal Once  . enoxaparin (LOVENOX) injection  110 mg Subcutaneous BID  . insulin aspart  0-15 Units Subcutaneous TID WC  . insulin glargine  18 Units Subcutaneous QHS  . LORazepam  1 mg Intravenous Once  . losartan  100 mg Oral Daily  . nicotine  14 mg Transdermal Daily  . pantoprazole  40 mg Oral Daily  . polyethylene glycol powder  17 g Oral Daily  . sodium chloride  3 mL Intravenous Q12H  . warfarin  7.5 mg Oral ONCE-1800  . Warfarin - Pharmacist Dosing Inpatient   Does not apply q1800  . DISCONTD: insulin glargine  15 Units Subcutaneous QHS  . DISCONTD: LORazepam  0.5 mg Intravenous Once  . DISCONTD: LORazepam  0.5 mg Intravenous Once     Assessment: 61yo female with PE and evolving splenic infarctions.  INR 1.06- small change, on day #3/5 minimum overlap with Lovenox.  Last Hg, pltc were stable.  No bleeding issues noted.  Goal of Therapy:  INR 2-3 Monitor platelets by anticoagulation protocol: Yes   Plan:  1.  Coumadin 10mg  2.  F/U in AM  Marisue Humble, PharmD Clinical Pharmacist [facility]

## 2012-10-14 NOTE — Progress Notes (Signed)
Internal Medicine Teaching Service Attending Note Date: 10/14/2012  Patient name: Joy Butler  Medical record number: [id number]  Date of birth: 1951-04-11    This patient has been seen and discussed with the house staff. Please see their note for complete details. I concur with their findings with the following additions/corrections: No overnight events. Vital signs are stable the patient continues to have some mild back pain as noted yesterday. Patient had her MRI study done which suggested a mild progression of her right paraspinal inflammatory process. Please see MRI results for complete impression. Progression of the signal changes as per the MRI results are worrisome for paraspinal infection. We would like to obtain an input from neurosurgery versus an infectious disease consult to guide [place] in this situation. Patient remains afebrile and aseptic at this time. Patient will be kept in the hospital for Lovenox and Coumadin bridging until her INR is more than 2. No antibiotics indicated at this time. Blood cultures can be drawn although the likelihood of them being positive is very low. We will check ESR at this time.  Lars Mage 10/14/2012, 2:19 PM .

## 2012-10-15 LAB — BASIC METABOLIC PANEL
BUN: 12 mg/dL (ref 6–23)
CO2: 27 mEq/L (ref 19–32)
Calcium: 9 mg/dL (ref 8.4–10.5)
Chloride: 105 mEq/L (ref 96–112)
Creatinine, Ser: 0.85 mg/dL (ref 0.50–1.10)
Glucose, Bld: 164 mg/dL — ABNORMAL HIGH (ref 70–99)

## 2012-10-15 LAB — GLUCOSE, CAPILLARY
Glucose-Capillary: 191 mg/dL — ABNORMAL HIGH (ref 70–99)
Glucose-Capillary: 141 mg/dL — ABNORMAL HIGH (ref 70–99)

## 2012-10-15 LAB — PROTIME-INR: INR: 1.17 (ref 0.00–1.49)

## 2012-10-15 MED ORDER — WARFARIN SODIUM 2.5 MG PO TABS
12.5000 mg | ORAL_TABLET | Freq: Once | ORAL | Status: AC
  Administered 2012-10-15: 12.5 mg via ORAL
  Filled 2012-10-15: qty 1

## 2012-10-15 NOTE — Discharge Summary (Signed)
Internal Medicine Teaching [facility] Discharge Note  Name: Joy Butler MRN: [id number] DOB: 10/27/1951 61 y.o.  Date of Admission: 10/11/2012  2:28 PM Date of Discharge: 10/19/2012 Attending Physician: Lars Mage, MD Discharge Diagnosis: Active Problems:  Chronic back pain  Abnormal MRI, thoracic spine  Recurrent pulmonary embolism  HTN (hypertension)  Diabetes mellitus  Diabetic peripheral neuropathy   Discharge Medications:   Medication List     As of 10/19/2012  6:11 AM    STOP taking these medications         doxycycline 100 MG tablet   Commonly known as: VIBRA-TABS      hydrochlorothiazide 25 MG tablet   Commonly known as: HYDRODIURIL      insulin glargine 100 UNIT/ML injection   Commonly known as: LANTUS      losartan 50 MG tablet   Commonly known as: COZAAR      oxyCODONE-acetaminophen 5-325 MG per tablet   Commonly known as: PERCOCET/ROXICET      TAKE these medications         acetaminophen 325 MG tablet   Commonly known as: TYLENOL   Take 2 tablets (650 mg total) by mouth every 6 (six) hours as needed for pain.      amitriptyline 25 MG tablet   Commonly known as: ELAVIL   Take 0.5 tablets (12.5 mg total) by mouth at bedtime.      amLODipine 10 MG tablet   Commonly known as: NORVASC   Take 1 tablet (10 mg total) by mouth daily.      enoxaparin 150 MG/ML injection   Commonly known as: LOVENOX   Inject 0.7 mLs (105 mg total) into the skin every 12 (twelve) hours.      insulin NPH-insulin regular (70-30) 100 UNIT/ML injection   Commonly known as: NOVOLIN 70/30   Inject 10 Units into the skin 2 (two) times daily with a meal.      ketorolac 10 MG tablet   Commonly known as: TORADOL   Take 1 tablet (10 mg total) by mouth every 6 (six) hours.      lisinopril-hydrochlorothiazide 20-25 MG per tablet   Commonly known as: PRINZIDE,ZESTORETIC   Take 1 tablet by mouth daily.      methocarbamol 500 MG tablet   Commonly known as: ROBAXIN   Take 1 tablet (500 mg total) by mouth 3 (three) times daily.      nicotine 14 mg/24hr patch   Commonly known as: NICODERM CQ - dosed in mg/24 hours   Place 1 patch onto the skin daily.      polyethylene glycol powder powder   Commonly known as: GLYCOLAX/MIRALAX   Take 17 g by mouth daily.      warfarin 5 MG tablet   Commonly known as: COUMADIN   Take 2 tablets (10 mg total) by mouth one time only at 6 PM.          Disposition and follow-up:      Joy Butler was discharged from [facility] in stable condition.    At the hospital follow up visit please address 1. Please assess the patient's back pain. 2. Please encourage patient to take her medication including insulin, and warfarin. She has an upcoming appointment with the Coumadin clinic. 3. Evaluate the patient for constipation. 4. Please encourage patient to stop smoking. She was discharged on nicotine patches. 5. Please continue to assist the patient identify a PCP in the area.    Follow-up Appointments:  Follow-up Information    Follow up with Gar Ponto, PHARMD. On 10/21/2012. (9:30am for coumadin checks as needed)    Contact information:    [facility]  - [place] [address] [phone]       Follow up with Dow Adolph, MD. On 10/21/2012. (9:45am)    Contact information:   [address] [place] [address] [phone]         Discharge Orders    Future Appointments: Provider: Department: Dept Phone: Center:   10/21/2012 9:45 AM Dow Adolph, MD [facility] [phone] [facility]     Future Orders Please Complete By Expires   Diet - low sodium heart healthy      Increase activity slowly      Discharge instructions      Comments:   You have an appointment for check you blood thinners (Warfarin) INR on Friday 10/21/2012 at 9:30am. Please it is very important that you come for this appointment Please attend Internal Medicine clinic at [facility]  on 10/21/2012 at 9:45am. Please refer to the information you have been handed to identify a primary care physician   Call MD for:  temperature >100.4      Call MD for:  severe uncontrolled pain      Call MD for:  hives         Consultations:  IR and Neurosurgery  Procedures Performed:  Dg Chest 2 View  10/11/2012  *RADIOLOGY REPORT*  Clinical Data: Chest pain.  CHEST - 2 VIEW  Comparison: September 04, 2012.  Findings: Cardiomediastinal silhouette appears normal.  Left lung is clear.  Linear density is noted in right midlung which is unchanged compared to prior exam and most consistent with scarring. Bony thorax is intact.  IMPRESSION: No acute cardiopulmonary abnormality seen.  Right midlung scarring is again noted.   Original Report Authenticated By: Venita Sheffield., M.D.    Ct Angio Chest Pe W/cm &/or Wo Cm  10/12/2012  *RADIOLOGY REPORT*  Clinical Data: Chest pain  CT ANGIOGRAPHY CHEST  Technique:  Multidetector CT imaging of the chest using the standard protocol during bolus administration of intravenous contrast. Multiplanar reconstructed images including MIPs were obtained and reviewed to evaluate the vascular anatomy.  Contrast: OMNIPAQUE IOHEXOL 350 MG/ML SOLN  Comparison: 10/11/2012 radiograph, 09/02/2012 abdominal CT  Findings: There is a branching pulmonary arterial filling defect within the the right lower lobe pulmonary artery, extending into proximal segmental branches.  The upper lobe arterial branches are inadequately opacified.  I do not see a filling defect within the left lower lobe branches.  Heart size is upper normal to mildly enlarged. No overt evidence for right ventricular heart strain.  Aortic valve and coronary artery calcifications.  No pleural or pericardial effusion. No intrathoracic lymphadenopathy.  Respiratory motion degrades detailed parenchymal evaluation.  There are areas of mosaic attenuation, favored to reflect air trapping and atelectasis.  No  pneumothorax.  Linear left lung base opacity is favored to reflect scarring or atelectasis.  Limited imaging through the upper abdomen shows areas of hypoattenuation within the spleen, decreased in size from the prior, favored to reflect sequelae of prior splenic infarctions.  IMPRESSION: Right lower lobe pulmonary embolism.  No overt CT evidence for right ventricular heart strain.  Evolving splenic infarctions.  Critical Value/emergent results were called by telephone at the time of interpretation on 10/12/2012 at 02:40 a.m. to Dr. Collier Bullock, who verbally acknowledged these results.   Original Report Authenticated  By: Waneta Martins, M.D.    Mr Thoracic Spine Wo Contrast  10/14/2012  *RADIOLOGY REPORT*  Clinical Data: 61 year old female with history of right flank pain, hypercoagulable state with splenic infarcts, abnormal right paraspinal process at T8.  The patient was recently readmitted and found have right side pulmonary emboli.  She presents for reevaluation of the thoracic spine.  The examination had to be discontinued prior to completion due to patient claustrophobia, despite premedication.Subsequently, no postcontrast images were obtained.  MRI THORACIC SPINE WITHOUT CONTRAST  Technique:  Multiplanar and multiecho pulse sequences of the thoracic spine were obtained without intravenous contrast.  Comparison: CTA chest 10/12/2012.  Thoracic MRI 09/05/2012.  Findings: No axial images were obtained.  Continued abnormal STIR signal along the right lateral aspect of the T8 and T9 vertebral bodies.  As before, the abnormal signal is in the vicinity of large right lateral endplate osteophytes, which themselves main shows some marrow edema.  There is continued mild endplate marrow edema laterally on the right at the T8-T9 level. Signal in the intervening disc remains normal.  The T8 vertebral body/endplate edema appears mildly increased.  There has been mild progression in the extent of the right paraspinal  signal, which now extends caudally to the inferior T10 level (series 6 image 1 on today's study versus series 8 image 2 on the comparison).  Comparing the findings to the recent CTA, there is mild right paraspinal soft tissue stranding on that study.  Neither exam shows convincing evidence of drainable fluid.  Trace right pleural effusion present today is new / increased.  Stable thoracic vertebral height and alignment.  Marrow signal at other levels is stable and within normal limits.  The no axial or postcontrast images were obtained, but the thoracic spinal cord appears stable and without definite signal abnormality.  Limited cervical spine sagittal imaging again demonstrates multilevel cervical spondylosis.  IMPRESSION: 1. The examination had to be discontinued prior to completion due to claustrophobia, despite premedication. 2.  Mild progression since last month of the right paraspinal inflammatory process centered at the T8 level.  The area of abnormal MRI signal is seen to correspond to mild paraspinal fat stranding on the recent CTA. No drainable or organized fluid collection. 3.  Interval mildly increased and endplate and mild vertebral body marrow edema at T8-T9, while signal in the intervening disc remains normal. 4. Given these constellation of findings, an indolent or smoldering right paraspinal infection is a concern, particularly given the patient's history of diabetes. But as before the appearance, and the mild progression over a months time while left untreated, is NOT typical of spinal osteomyelitis.  And it is still possible that the findings are degenerative rather than infectious, but the progression of the signal changes is worrisome. 5.  Trace right pleural effusion, probably related to the recently diagnosed right pulmonary embolus.   Original Report Authenticated By: Harley Hallmark, M.D.         Admission HPI:  Joy Butler is a 61 year old woman with a PMH significant for Diabetes,  hypertension, PE in 2011, and recent splenic infarction who presented to the [facility] ED complaining of left sided chest pain that started the evening prior to admission. She stated that the pain was in her left side moving under the left breast and to the substernal area. She had never had anything like this before. She was watching TV when it started. She stated that the pain was an ache that did not get  worse or better when she walks. She was mildly nauseated last but that resolved after about 20 minutes and he had one episode of cold chills but denies fevers, cough, vomiting, shortness of breath, abdominal pain, diarrhea, or constipation. She had had some belching and a sour taste in the back of her throat for the last few weeks. She stated that she had been out of her medications for about 2 weeks. Her fiance and herself moved to [facility] in August of 2013 and just found a place to live. They are on a fixed income and she has been unable to afford her medications. She states that she has Medicare and [employer] for her insurance. She does not have a PCP and has also not been on her coumadin since she was discharged from the hospital in September 2013.   Hospital Course by problem list:  Recurrent pulmonary embolism: The patient was evaluated for a recurrent pulmonary embolism and indeed, the CT angiogram of the chest compound a right lobar pulmonary embolism. On presentation in the ED, her vital signs were stable. Previously she had been on Coumadin but she got off Coumadin due to cost. She was initiated on Coumadin with Lovenox overlap during the course of stay in the hospital. Her INR remained subtherapeutic and at discharge. It was 1.66. Therefore, she was discharged on Coumadin, and to continue with Lovenox injections for the next 5 days. She indicated that she has used Lovenox injections in the past and therefore, it was felt that she was capable of giving herself these injections as  outpatient. She will be seen at the Coumadin clinic at [facility] after 4 days. T8 right osteophyte fracture: Over the course of the patient's stay in the hospital she complained of right-sided chest pain, mainly on the posterior side and infrascapular region. Physical examination, revealed significant tenderness around the parasternal area of the thoracic spine. MRI of the thoracic spine which had been done on 9/16 showed abnormal right paraspinal/intercostal process centered at the T8 level and moderate to severe degenerative disease at the T10-T11. This was felt to be 2/2 to thrombophlebitis but infectious process could not be excluded. The patient was afebrile and did not have a leukocytosis. Radiology had recommended a repeat thoracic MRI (without and with contrast preferred) in 10-14 days but the patient did not follow up with this possibly due to cost. A repeat MRI of the thoracic spine repeated during hospitalization, indicated a fracture of the right osteophyte at T8 with signal abnormality. It was, therefore strongly felt to her back pain was a result of this inflammatory process secondary to the fracture as opposed to an infectious or thrombophlebitis. Further evaluation with biopsy or fine needle aspiration was therefore not indicated. She was managed with analgesics including oxycodone and morphine and her pain was well-controlled. She was discharged on oral ketocolac, and to followup as outpatient. Diabetes Mellitius: The patient is managed on Lantus 15 U but she had been out of all of her medications for at least 2 weeks. She stated that when she is in the hospital her sugars are managed well but when she goes home she still fries her food and she thinks that is driving her blood sugar up.  Hgbg A1c 10.4 on 9/11. During her hospitalization, her blood sugars continue to remain elevated in 170s. Lantus was increased to 18IU. She was discharged on NPH and NovoLog insulin as this is more affordable.  Her medications will be reviewed as  outpatient.  Peripheral neuropathies: Patient reported that she has had a long history of peripheral neuropathy. She reports that her doctor advised her to move from [place] to the [place] for warm weather which would improve her peripheral neuropathy. However, she has continued experiencing peripheral neuropathies involving the feet in the socks distribution. She also reports that her legs sometimes give way with a recent history of a fall. She would benefit from pregabalin however, the cost might be prohibitive. She was therefore discharged on a more affordable medication, which is amitriptyline at 12.5 mg at night. This will be evaluated further as outpatient. Hypertension: The patient reports, that she's been hypertensive for a long time and has challenges affording medications. Her home medications included losartan, amlodipine, and HCTZ. She was discharged on lisinopril-hydrochlorothiazide combination as this is on the [employer] 4 dollar list. She will continue with her amlodipine. Blood pressure at discharge was well-controlled at 136/74 mmHg. Smoking: Joy Butler reports history of smoking about 6-7 cigarettes per day. In the past quit for 2 years. However, she relapsed over the last year. She has desire to quit with assistance. Discharged on continue with Nicotine patch.  Low material resources: She has been intermittently homeless over the last two months since moving to [facility] but recently found a place to live but per her report it did not come with appliances which they are saving to try to afford. She has not been able to afford her medications on her fixed income but has [employer] to help her pay for her medications.  Per case manager since patient has two insurance coverage she is not eligible for any assistance from the hospital.  She has been provided with information regarding finding Physicians in within the [place] area, who will be able to  accept her insurance. She'll have a hospital followup visit at the internal medicine clinic at [facility]     Discharge Vitals:  BP 136/74  Pulse 74  Temp 98.2 F (36.8 C) (Oral)  Resp 18  Ht 5' 8.9" (1.75 m)  Wt 233 lb (105.688 kg)  BMI 34.51 kg/m2  SpO2 98%  Discharge Labs:  No results found for this or any previous visit (from the past 24 hour(s)).  Signed: Dow Adolph 10/19/2012, 6:11 AM   Time Spent on Discharge: 1hr

## 2012-10-15 NOTE — Progress Notes (Signed)
Subjective: Patient feels ok but still complains about the pain in the back .   Objective: Vital signs in last 24 hours: Filed Vitals:   10/14/12 0946 10/14/12 1400 10/14/12 2100 10/15/12 0500  BP: 145/85 128/73 163/83 148/72  Pulse: 83 77 91 77  Temp:  98.3 F (36.8 C) 97.6 F (36.4 C) 98.4 F (36.9 C)  TempSrc:  Oral Oral Oral  Resp:  18    Height:      Weight:    234 lb 12.8 oz (106.505 kg)  SpO2:  96% 100% 100%   Weight change: 12.8 oz (0.363 kg) No intake or output data in the 24 hours ending 10/15/12 1158  Physical Exam: Constitutional: Vital signs reviewed.  Patient is a well-developed and well-nourished  in no acute distress and cooperative with exam. Alert and oriented x3.   Neck: Supple,  Cardiovascular: RRR, S1 normal, S2 normal, no MRG, pulses symmetric and intact bilaterally Pulmonary/Chest: CTAB, no wheezes, rales, or rhonchi Abdominal: Soft. Non-tender, non-distended,  Neurological: A&O x3,   Lab Results: Basic Metabolic Panel:  Lab 10/15/12 4098 10/14/12 0610 10/11/12 2222  NA 140 139 --  K 3.7 3.7 --  CL 105 104 --  CO2 27 25 --  GLUCOSE 164* 174* --  BUN 12 8 --  CREATININE 0.85 0.85 --  CALCIUM 9.0 8.9 --  MG -- -- 1.8  PHOS -- -- --   Liver Function Tests:  Lab 10/11/12 1604  AST 11  ALT <5  ALKPHOS 126*  BILITOT 0.3  PROT 7.1  ALBUMIN 2.5*    CBC:  Lab 10/13/12 0500 10/12/12 0525  WBC 9.0 8.8  NEUTROABS -- 4.8  HGB 12.5 12.1  HCT 37.7 36.0  MCV 83.4 83.3  PLT 356 339   Cardiac Enzymes:  Lab 10/12/12 0853 10/12/12 0525 10/11/12 2221  CKTOTAL -- -- --  CKMB -- -- --  CKMBINDEX -- -- --  TROPONINI <0.30 <0.30 <0.30   BNP:  Lab 10/11/12 1535  PROBNP 630.6*   CBG:  Lab 10/15/12 1141 10/15/12 0800 10/14/12 2151 10/14/12 1633 10/14/12 1129 10/14/12 0748  GLUCAP 149* 167* 184* 179* 169* 145*    Fasting Lipid Panel:  Lab 10/12/12 0525  CHOL 214*  HDL 33*  LDLCALC 133*  TRIG 239*  CHOLHDL 6.5  LDLDIRECT --    Thyroid Function Tests:  Lab 10/11/12 2222  TSH 1.372  T4TOTAL --  FREET4 --  T3FREE --  THYROIDAB --   Coagulation:  Lab 10/15/12 0540 10/14/12 0610 10/13/12 0500 10/11/12 1604  LABPROT 14.7 13.7 13.3 13.0  INR 1.17 1.06 1.02 0.99    Urine Drug Screen: Drugs of Abuse     Component Value Date/Time   LABOPIA POSITIVE* 10/12/2012 0803   COCAINSCRNUR NONE DETECTED 10/12/2012 0803   LABBENZ NONE DETECTED 10/12/2012 0803   AMPHETMU NONE DETECTED 10/12/2012 0803   THCU NONE DETECTED 10/12/2012 0803   LABBARB NONE DETECTED 10/12/2012 0803    Urinalysis:  Lab 10/12/12 0803  COLORURINE YELLOW  LABSPEC >1.046*  PHURINE 6.0  GLUCOSEU 100*  HGBUR TRACE*  BILIRUBINUR NEGATIVE  KETONESUR NEGATIVE  PROTEINUR >300*  UROBILINOGEN 0.2  NITRITE NEGATIVE  LEUKOCYTESUR NEGATIVE    Micro Results: No results found for this or any previous visit (from the past 240 hour(s)). Studies/Results: Mr Thoracic Spine Wo Contrast  10/14/2012  *RADIOLOGY REPORT*  Clinical Data: 61 year old female with history of right flank pain, hypercoagulable state with splenic infarcts, abnormal right paraspinal process at T8.  The  patient was recently readmitted and found have right side pulmonary emboli.  She presents for reevaluation of the thoracic spine.  The examination had to be discontinued prior to completion due to patient claustrophobia, despite premedication.Subsequently, no postcontrast images were obtained.  MRI THORACIC SPINE WITHOUT CONTRAST  Technique:  Multiplanar and multiecho pulse sequences of the thoracic spine were obtained without intravenous contrast.  Comparison: CTA chest 10/12/2012.  Thoracic MRI 09/05/2012.  Findings: No axial images were obtained.  Continued abnormal STIR signal along the right lateral aspect of the T8 and T9 vertebral bodies.  As before, the abnormal signal is in the vicinity of large right lateral endplate osteophytes, which themselves main shows some marrow  edema.  There is continued mild endplate marrow edema laterally on the right at the T8-T9 level. Signal in the intervening disc remains normal.  The T8 vertebral body/endplate edema appears mildly increased.  There has been mild progression in the extent of the right paraspinal signal, which now extends caudally to the inferior T10 level (series 6 image 1 on today's study versus series 8 image 2 on the comparison).  Comparing the findings to the recent CTA, there is mild right paraspinal soft tissue stranding on that study.  Neither exam shows convincing evidence of drainable fluid.  Trace right pleural effusion present today is new / increased.  Stable thoracic vertebral height and alignment.  Marrow signal at other levels is stable and within normal limits.  The no axial or postcontrast images were obtained, but the thoracic spinal cord appears stable and without definite signal abnormality.  Limited cervical spine sagittal imaging again demonstrates multilevel cervical spondylosis.  IMPRESSION: 1. The examination had to be discontinued prior to completion due to claustrophobia, despite premedication. 2.  Mild progression since last month of the right paraspinal inflammatory process centered at the T8 level.  The area of abnormal MRI signal is seen to correspond to mild paraspinal fat stranding on the recent CTA. No drainable or organized fluid collection. 3.  Interval mildly increased and endplate and mild vertebral body marrow edema at T8-T9, while signal in the intervening disc remains normal. 4. Given these constellation of findings, an indolent or smoldering right paraspinal infection is a concern, particularly given the patient's history of diabetes. But as before the appearance, and the mild progression over a months time while left untreated, is NOT typical of spinal osteomyelitis.  And it is still possible that the findings are degenerative rather than infectious, but the progression of the signal changes  is worrisome. 5.  Trace right pleural effusion, probably related to the recently diagnosed right pulmonary embolus.   Original Report Authenticated By: Harley Hallmark, M.D.    Medications: I have reviewed the patient's current medications. Scheduled Meds:   . amLODipine  10 mg Oral Daily  . aspirin EC  81 mg Oral Daily  . enoxaparin (LOVENOX) injection  110 mg Subcutaneous BID  . insulin aspart  0-15 Units Subcutaneous TID WC  . insulin glargine  18 Units Subcutaneous QHS  . losartan  100 mg Oral Daily  . nicotine  14 mg Transdermal Daily  . pantoprazole  40 mg Oral Daily  . polyethylene glycol powder  17 g Oral Daily  . sodium chloride  3 mL Intravenous Q12H  . warfarin  10 mg Oral ONCE-1800  . Warfarin - Pharmacist Dosing Inpatient   Does not apply q1800   Continuous Infusions:  PRN Meds:.feeding supplement, HYDROcodone-acetaminophen, ondansetron (ZOFRAN) IV, ondansetron Assessment/Plan:  Lazenby is  a 61 year old woman with a PMH significant for Diabetes, hypertension, PE, and recent Splenic infarct who presents because of left sided aching chest pain for 24 hours.   Right lower lobe PE:  Plan  - This is day day 5 of coumadin and Lovenox overlap therapy. 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 It can not be provided from pharmacy since patient has insurance  Thoracic Spine Disease: Repeat MRI of the thoracic spine indicate some progression her paraspinal diseases compared to last month. It is unclear whether this is as infectious process. She denies fevers or chills. She has been afebrile since admission. No leukocytosis. Neurosurgery was consulted who noted no further neurosurgical follow up needed but may consider infectious source. ID noted it is less likely but would recommend possible biopsy by IR. ESR elevated to 72.  Plan  - Will discuss with IR for  possible biopsy in am    Left Chest pain (resolved): The cause of this patient's chest pain is still elusive. She now described the chest pain as dull and aching and has migrated from the left anterior to the posterior side still in the left side. She has significant risk factors for coronary artery disease, including cigarette smoking, hypertension, diabetes, hyperlipidemia and obesity however her chest pain is very atypical. The most likely cause of this chest pain is splenic infarction as opposed to a cardiac cause. She denies other possible causes of this chest pain, including pancreatitis-she does not report nausea or vomiting, and she does not have any risk factors for pancreatitis like alcohol ingestion. The level of her hypertriglyceridemia (239) is not significant enough (<1000) to cause pancreatitis as most pancreatitis. Aortic dissection would have been identified on chest CT angiogram. Chest x-ray was normal. White blood cell count has normalized from 10.9-8.8 without any antibiotics, which further excludes pneumonia.  Plan  - will continue to monitor clinically   Diabetes Mellitius: The patient is managed on Lantus 15 U. On admission she was restarted Lantus at 10 UI with a meal coverage. Her blood sugars continue to remain elevated in 170s.  Plan  - increased with Lantus from 18 to 20 IU at bed time  - SSI  -Continue to monitor CBGs.   Peripheral neuropathies: Patient reports that she has had a long history of peripheral neuropathy. She reports that her doctor advised her to walk from [place] to the [place] that the warm weather there would improve her peripheral neuropathy. However, she has continued experiencing peripheral neuropathies involving the feet in the socks distribution. She also reports that her legs sometimes give way with a recent history of a fall.  Plan  -This patient would benefit from pregabalin however, the cost might be prohibitive. I would, therefore recommend a more  affordable option that could be effective like amitriptyline starting at 12.5 mg at night.   HTN: She is hypertensive today and states that she has been out of her medications for at least 2 weeks. BP is better today in the range of 145/85  Plan  -Continue with Losartan, Amlodipine  - we will consider restarting her on her home HCTZ, if the blood pressures remain uncontrolled.   Smoking: Ms. Artiga reports history of smoking about 6-7 cigarettes per day. In the past quit for 2 years. However, she relapsed over the last year. She has desire to quit with  assistance.  Plan  - continue with Nicotine patch.   Low material resources: She has been intermittently homeless over the last two months since moving to [place] but recently found a place to live but per her report it did not come with appliances which they are saving to try to afford. She has not been able to afford her medications on her fixed income but has [employer] to help her pay for her medications.  Per case manager since patient has two insurance coverage she is not eligible for any assistance from the hospital   DVT: warfarin and Lovenox    LOS: 4 days   Annelie Boak 10/15/2012, 11:58 AM

## 2012-10-15 NOTE — Progress Notes (Signed)
ANTICOAGULATION CONSULT NOTE - Follow Up Consult  Pharmacy Consult for Coumadin/Lovenox Indication: pulmonary embolus  Allergies  Allergen Reactions  . Keflex (Cephalexin) Itching and Swelling    Lips and eyes swelled up  . Latex Itching  . Sulfa Antibiotics Rash  . Sulfur Rash    Patient Measurements: Height: 5' 8.9" (175 cm) Weight: 234 lb 12.8 oz (106.505 kg) IBW/kg (Calculated) : 65.97   Vital Signs: Temp: 98.4 F (36.9 C) (10/26 0500) Temp src: Oral (10/26 0500) BP: 148/72 mmHg (10/26 0500) Pulse Rate: 77  (10/26 0500)  Labs:  Basename 10/15/12 0540 10/14/12 0610 10/13/12 0615 10/13/12 0500  HGB -- -- -- 12.5  HCT -- -- -- 37.7  PLT -- -- -- 356  APTT -- -- -- --  LABPROT 14.7 13.7 -- 13.3  INR 1.17 1.06 -- 1.02  HEPARINUNFRC -- -- -- --  CREATININE 0.85 0.85 0.82 --  CKTOTAL -- -- -- --  CKMB -- -- -- --  TROPONINI -- -- -- --    Estimated Creatinine Clearance: 87.9 ml/min (by C-G formula based on Cr of 0.85).   Medications:  Scheduled:    . amLODipine  10 mg Oral Daily  . aspirin EC  81 mg Oral Daily  . enoxaparin (LOVENOX) injection  110 mg Subcutaneous BID  . insulin aspart  0-15 Units Subcutaneous TID WC  . insulin glargine  18 Units Subcutaneous QHS  . losartan  100 mg Oral Daily  . nicotine  14 mg Transdermal Daily  . pantoprazole  40 mg Oral Daily  . polyethylene glycol powder  17 g Oral Daily  . sodium chloride  3 mL Intravenous Q12H  . warfarin  10 mg Oral ONCE-1800  . Warfarin - Pharmacist Dosing Inpatient   Does not apply q1800    Assessment: 61 year old female receiving Coumadin and Lovenox for RLL PE.  Previously on Coumadin for hx of PE in 2011, off since 08/2012.  Today is day #4 of minimum 5 day overlap of Coumadin/Lovenox.  No bleeding or bruising noted.  CBC from 10/13/12 is wnl, stable.  Today's INR is 1.17.  Will increase Coumadin dose to 12.5 mg tonight to try to get INR moving.  Continue current Lovenox dosing.    Goal of  Therapy:  INR 2-3 Monitor platelets by anticoagulation protocol: Yes   Plan:  1.  Continue Lovenox 110 mg [place] twice daily 2.  Coumadin 12.5 mg tonight at 1800 3.  Continue to monitor daily INR and signs/symptoms of bleeding  Lillia Pauls, PharmD Clinical Pharmacist Pager: [phone] Phone: [phone] 10/15/2012 2:07 PM

## 2012-10-16 DIAGNOSIS — E1149 Type 2 diabetes mellitus with other diabetic neurological complication: Secondary | ICD-10-CM

## 2012-10-16 DIAGNOSIS — M549 Dorsalgia, unspecified: Secondary | ICD-10-CM

## 2012-10-16 DIAGNOSIS — K3184 Gastroparesis: Secondary | ICD-10-CM

## 2012-10-16 DIAGNOSIS — Z86711 Personal history of pulmonary embolism: Secondary | ICD-10-CM

## 2012-10-16 DIAGNOSIS — E119 Type 2 diabetes mellitus without complications: Secondary | ICD-10-CM

## 2012-10-16 DIAGNOSIS — R937 Abnormal findings on diagnostic imaging of other parts of musculoskeletal system: Secondary | ICD-10-CM | POA: Diagnosis present

## 2012-10-16 DIAGNOSIS — G8929 Other chronic pain: Secondary | ICD-10-CM

## 2012-10-16 DIAGNOSIS — E1142 Type 2 diabetes mellitus with diabetic polyneuropathy: Secondary | ICD-10-CM

## 2012-10-16 DIAGNOSIS — D7389 Other diseases of spleen: Secondary | ICD-10-CM

## 2012-10-16 LAB — BASIC METABOLIC PANEL
CO2: 25 mEq/L (ref 19–32)
Chloride: 102 mEq/L (ref 96–112)
Creatinine, Ser: 0.8 mg/dL (ref 0.50–1.10)
GFR calc Af Amer: 89 mL/min — ABNORMAL LOW (ref >90)
Potassium: 3.4 mEq/L — ABNORMAL LOW (ref 3.5–5.1)

## 2012-10-16 LAB — PROTIME-INR
INR: 1.38 (ref 0.00–1.49)
Prothrombin Time: 16.6 seconds — ABNORMAL HIGH (ref 11.6–15.2)

## 2012-10-16 LAB — GLUCOSE, CAPILLARY
Glucose-Capillary: 150 mg/dL — ABNORMAL HIGH (ref 70–99)
Glucose-Capillary: 179 mg/dL — ABNORMAL HIGH (ref 70–99)

## 2012-10-16 LAB — CBC
HCT: 39.1 % (ref 36.0–46.0)
Hemoglobin: 13.2 g/dL (ref 12.0–15.0)
MCHC: 33.8 g/dL (ref 30.0–36.0)
RBC: 4.7 MIL/uL (ref 3.87–5.11)

## 2012-10-16 MED ORDER — POLYETHYLENE GLYCOL 3350 17 G PO PACK
17.0000 g | PACK | Freq: Every day | ORAL | Status: DC
  Administered 2012-10-16 – 2012-10-17 (×2): 17 g via ORAL
  Filled 2012-10-16 (×2): qty 1

## 2012-10-16 MED ORDER — POTASSIUM CHLORIDE CRYS ER 20 MEQ PO TBCR
40.0000 meq | EXTENDED_RELEASE_TABLET | Freq: Once | ORAL | Status: AC
  Administered 2012-10-16: 40 meq via ORAL
  Filled 2012-10-16: qty 2

## 2012-10-16 MED ORDER — KETOROLAC TROMETHAMINE 10 MG PO TABS
10.0000 mg | ORAL_TABLET | Freq: Four times a day (QID) | ORAL | Status: DC
  Administered 2012-10-16 – 2012-10-17 (×5): 10 mg via ORAL
  Filled 2012-10-16 (×8): qty 1

## 2012-10-16 MED ORDER — ACETAMINOPHEN 325 MG PO TABS
650.0000 mg | ORAL_TABLET | Freq: Four times a day (QID) | ORAL | Status: DC | PRN
  Administered 2012-10-16 – 2012-10-17 (×2): 650 mg via ORAL
  Filled 2012-10-16 (×2): qty 2

## 2012-10-16 MED ORDER — ENOXAPARIN SODIUM 120 MG/0.8ML ~~LOC~~ SOLN
110.0000 mg | Freq: Once | SUBCUTANEOUS | Status: AC
  Administered 2012-10-16: 110 mg via SUBCUTANEOUS
  Filled 2012-10-16: qty 0.8

## 2012-10-16 MED ORDER — ONDANSETRON HCL 4 MG/2ML IJ SOLN
4.0000 mg | Freq: Once | INTRAMUSCULAR | Status: AC
  Administered 2012-10-16: 4 mg via INTRAVENOUS

## 2012-10-16 MED ORDER — DOCUSATE SODIUM 283 MG RE ENEM
1.0000 | ENEMA | Freq: Once | RECTAL | Status: AC
  Administered 2012-10-16: 283 mg via RECTAL
  Filled 2012-10-16 (×2): qty 1

## 2012-10-16 MED ORDER — HYDROCHLOROTHIAZIDE 25 MG PO TABS
25.0000 mg | ORAL_TABLET | Freq: Every day | ORAL | Status: DC
  Administered 2012-10-16 – 2012-10-17 (×2): 25 mg via ORAL
  Filled 2012-10-16 (×2): qty 1

## 2012-10-16 NOTE — Progress Notes (Signed)
ANTICOAGULATION CONSULT NOTE - Follow Up Consult  Pharmacy Consult for Coumadin/Lovenox Indication: pulmonary embolus  Allergies  Allergen Reactions  . Keflex (Cephalexin) Itching and Swelling    Lips and eyes swelled up  . Latex Itching  . Sulfa Antibiotics Rash  . Sulfur Rash    Patient Measurements: Height: 5' 8.9" (175 cm) Weight: 236 lb (107.049 kg) IBW/kg (Calculated) : 65.97   Vital Signs: Temp: 98 F (36.7 C) (10/27 0500) BP: 130/87 mmHg (10/27 0943) Pulse Rate: 74  (10/27 0500)  Labs:  Basename 10/16/12 0500 10/15/12 1627 10/15/12 0540 10/14/12 0610  HGB 13.2 -- -- --  HCT 39.1 -- -- --  PLT 361 -- -- --  APTT -- -- -- --  LABPROT 16.6* -- 14.7 13.7  INR 1.38 -- 1.17 1.06  HEPARINUNFRC -- -- -- --  CREATININE 0.80 -- 0.85 0.85  CKTOTAL -- 39 -- --  CKMB -- -- -- --  TROPONINI -- -- -- --    Estimated Creatinine Clearance: 93.6 ml/min (by C-G formula based on Cr of 0.8).   Medications:  Scheduled:     . amLODipine  10 mg Oral Daily  . aspirin EC  81 mg Oral Daily  . docusate sodium  1 enema Rectal Once  . enoxaparin (LOVENOX) injection  110 mg Subcutaneous Once  . hydrochlorothiazide  25 mg Oral Daily  . insulin aspart  0-15 Units Subcutaneous TID WC  . insulin glargine  18 Units Subcutaneous QHS  . ketorolac  10 mg Oral Q6H  . losartan  100 mg Oral Daily  . nicotine  14 mg Transdermal Daily  . ondansetron (ZOFRAN) IV  4 mg Intravenous Once  . pantoprazole  40 mg Oral Daily  . polyethylene glycol  17 g Oral Daily  . potassium chloride  40 mEq Oral Once  . sodium chloride  3 mL Intravenous Q12H  . warfarin  12.5 mg Oral ONCE-1800  . DISCONTD: enoxaparin (LOVENOX) injection  110 mg Subcutaneous BID  . DISCONTD: polyethylene glycol powder  17 g Oral Daily  . DISCONTD: Warfarin - Pharmacist Dosing Inpatient   Does not apply q1800    Assessment: 61 year old female receiving Coumadin and Lovenox for RLL PE.  Previously on Coumadin for hx of PE  in 2011, off since 08/2012.  Today is day #5 of minimum 5 day overlap of Coumadin/Lovenox.  No bleeding or bruising noted.  CBC from 10/13/12 is wnl, stable.  Today's INR is 1.38.  Holding Coumadin dose tonight for IR procedure in the AM.  Will give a dose of Lovenox tonight and hold AM dose before restarting post-procedure.    Goal of Therapy:  INR 2-3 Monitor platelets by anticoagulation protocol: Yes   Plan:  1.  Lovenox 110 mg [place] x1 tonight, will restart post-procedure 2.  Coumadin hold tonight 3.  Continue to monitor daily INR and signs/symptoms of bleeding   Vania Rea. Darin Engels.D. Clinical Pharmacist Pager [phone] Phone [phone] 10/16/2012 1:30 PM

## 2012-10-16 NOTE — Progress Notes (Addendum)
Subjective: Patient noted that she is feeling some nausea and having again the pain in the back.  No events over night  Objective: Vital signs in last 24 hours: Filed Vitals:   10/15/12 1400 10/15/12 2100 10/16/12 0500 10/16/12 0943  BP: 123/74 143/83 151/95 130/87  Pulse: 84 76 74   Temp: 98.2 F (36.8 C) 97.8 F (36.6 C) 98 F (36.7 C)   TempSrc: Oral     Resp: 20 16 18    Height:      Weight:   236 lb (107.049 kg)   SpO2: 100% 93% 92%    Weight change: 1 lb 3.2 oz (0.544 kg)  Intake/Output Summary (Last 24 hours) at 10/16/12 1129 Last data filed at 10/16/12 0946  Gross per 24 hour  Intake    363 ml  Output      0 ml  Net    363 ml   Physical Exam: Constitutional: Vital signs reviewed.  Patient is a well-developed and well-nourished  in no acute distress and cooperative with exam. Alert and oriented x3.  Neck: Supple,  Cardiovascular: RRR, S1 normal, S2 normal, no MRG, pulses symmetric and intact bilaterally Pulmonary/Chest: CTAB, no wheezes, rales, or rhonchi Abdominal: Soft. Non-tender, non-distended,  Musculoskeletal: Mild paraspinal tenderness on palpation more on the left as well as subscapular area.  Neurological: A&O x3, Strenght is normal and symmetric bilaterally,no focal motor deficit, sensory intact to light touch bilaterally.   Lab Results: Basic Metabolic Panel:  Lab 10/16/12 4782 10/15/12 0540 10/11/12 2222  NA 138 140 --  K 3.4* 3.7 --  CL 102 105 --  CO2 25 27 --  GLUCOSE 143* 164* --  BUN 13 12 --  CREATININE 0.80 0.85 --  CALCIUM 8.9 9.0 --  MG -- -- 1.8  PHOS -- -- --   Liver Function Tests:  Lab 10/11/12 1604  AST 11  ALT <5  ALKPHOS 126*  BILITOT 0.3  PROT 7.1  ALBUMIN 2.5*    CBC:  Lab 10/16/12 0500 10/13/12 0500 10/12/12 0525  WBC 6.9 9.0 --  NEUTROABS -- -- 4.8  HGB 13.2 12.5 --  HCT 39.1 37.7 --  MCV 83.2 83.4 --  PLT 361 356 --   Cardiac Enzymes:  Lab 10/15/12 1627 10/12/12 0853 10/12/12 0525 10/11/12 2221    CKTOTAL 39 -- -- --  CKMB -- -- -- --  CKMBINDEX -- -- -- --  TROPONINI -- <0.30 <0.30 <0.30   BNP:  Lab 10/11/12 1535  PROBNP 630.6*    CBG:  Lab 10/16/12 0801 10/15/12 2103 10/15/12 1713 10/15/12 1141 10/15/12 0800 10/14/12 2151  GLUCAP 150* 141* 191* 149* 167* 184*    Fasting Lipid Panel:  Lab 10/12/12 0525  CHOL 214*  HDL 33*  LDLCALC 133*  TRIG 239*  CHOLHDL 6.5  LDLDIRECT --   Thyroid Function Tests:  Lab 10/11/12 2222  TSH 1.372  T4TOTAL --  FREET4 --  T3FREE --  THYROIDAB --   Coagulation:  Lab 10/16/12 0500 10/15/12 0540 10/14/12 0610 10/13/12 0500  LABPROT 16.6* 14.7 13.7 13.3  INR 1.38 1.17 1.06 1.02    Urine Drug Screen: Drugs of Abuse     Component Value Date/Time   LABOPIA POSITIVE* 10/12/2012 0803   COCAINSCRNUR NONE DETECTED 10/12/2012 0803   LABBENZ NONE DETECTED 10/12/2012 0803   AMPHETMU NONE DETECTED 10/12/2012 0803   THCU NONE DETECTED 10/12/2012 0803   LABBARB NONE DETECTED 10/12/2012 0803     Urinalysis:  Lab 10/12/12 9562  COLORURINE YELLOW  LABSPEC >1.046*  PHURINE 6.0  GLUCOSEU 100*  HGBUR TRACE*  BILIRUBINUR NEGATIVE  KETONESUR NEGATIVE  PROTEINUR >300*  UROBILINOGEN 0.2  NITRITE NEGATIVE  LEUKOCYTESUR NEGATIVE   Medications: I have reviewed the patient's current medications. Scheduled Meds:   . amLODipine  10 mg Oral Daily  . aspirin EC  81 mg Oral Daily  . enoxaparin (LOVENOX) injection  110 mg Subcutaneous BID  . hydrochlorothiazide  25 mg Oral Daily  . insulin aspart  0-15 Units Subcutaneous TID WC  . insulin glargine  18 Units Subcutaneous QHS  . losartan  100 mg Oral Daily  . nicotine  14 mg Transdermal Daily  . ondansetron (ZOFRAN) IV  4 mg Intravenous Once  . pantoprazole  40 mg Oral Daily  . polyethylene glycol powder  17 g Oral Daily  . sodium chloride  3 mL Intravenous Q12H  . warfarin  12.5 mg Oral ONCE-1800  . Warfarin - Pharmacist Dosing Inpatient   Does not apply q1800   Continuous  Infusions:  PRN Meds:.feeding supplement, HYDROcodone-acetaminophen, ondansetron (ZOFRAN) IV, ondansetron Assessment/Plan:  Kreischer is a 61 year old woman with a PMH significant for Diabetes, hypertension, PE, and recent Splenic infarct who presents because of left sided aching chest pain for 24 hours.   Right lower lobe PE:  Plan  - This is day day 6 of coumadin and Lovenox overlap therapy. 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 It can not be provided from pharmacy since patient has insurance  - Will hold warfarin tonight and lovenox in am in the setting of IR procedure : INR <1.5   Abnormal MRI of thorax :  Repeat MRI of the thoracic spine indicate some progression her paraspinal diseases compared to last month. It is unclear whether this is as infectious process vs inflammatory. She denies fevers or chills. She has been afebrile since admission. No leukocytosis. Neurosurgery was consulted who noted no further neurosurgical follow up needed but may consider infectious source. ID noted it is less likely but would recommend possible biopsy by IR. ESR elevated to 72.  Plan  - Biopsy in am by IR   Left Chest pain - resolved but now has subscapula as well as paraspinal pain:  Plan  - will continue to monitor clinically   Diabetes Mellitius: Hgbg A1c  10.4. Home dose Lantus 15 U. On admission she was restarted Lantus at 15 UI with a meal coverage. Her blood sugars continue to remain elevated in 170s. Lantus was increased to 18 on 10/25.   Plan  - Continue  lantus 18 U at bed time  - SSI sens   Peripheral neuropathies: Patient reports that she has had a long history of peripheral neuropathy. She reports that her doctor advised her to walk from [place] to the [place] that the warm weather there would improve her peripheral neuropathy. However, she has continued  experiencing peripheral neuropathies involving the feet in the socks distribution. She also reports that her legs sometimes give way with a recent history of a fall.  Plan  -This patient would benefit from pregabalin however, the cost might be prohibitive. I would, therefore recommend a more affordable option that could be effective like amitriptyline starting at 12.5 mg at night.   HTN: She is hypertensive today and states that she has been out of her medications for at  least 2 weeks  prior to admission. BP is elevated.  Plan  -Continue with Losartan, Amlodipine   - Restart home dose HCTZ   Smoking: Ms. Deak reports history of smoking about 6-7 cigarettes per day. In the past quit for 2 years. However, she relapsed over the last year. She has desire to quit with assistance.  Plan  - continue with Nicotine patch.   Low material resources: She has been intermittently homeless over the last two months since moving to [facility] but recently found a place to live but per her report it did not come with appliances which they are saving to try to afford. She has not been able to afford her medications on her fixed income but has [employer] to help her pay for her medications.  Per case manager since patient has two insurance coverage she is not eligible for any assistance from the hospital   DVT: warfarin and Lovenox     LOS: 5 days   Christelle Igoe 10/16/2012, 11:29 AM

## 2012-10-16 NOTE — Progress Notes (Signed)
Pt. IV out. IV team attempted to restart IV x2. Making it the 4th attempt at IV site. TC MD ok for no access at this time.

## 2012-10-17 DIAGNOSIS — I2699 Other pulmonary embolism without acute cor pulmonale: Secondary | ICD-10-CM | POA: Diagnosis present

## 2012-10-17 DIAGNOSIS — R937 Abnormal findings on diagnostic imaging of other parts of musculoskeletal system: Secondary | ICD-10-CM

## 2012-10-17 DIAGNOSIS — I10 Essential (primary) hypertension: Secondary | ICD-10-CM

## 2012-10-17 LAB — GLUCOSE, CAPILLARY: Glucose-Capillary: 158 mg/dL — ABNORMAL HIGH (ref 70–99)

## 2012-10-17 LAB — BASIC METABOLIC PANEL
CO2: 27 mEq/L (ref 19–32)
Chloride: 104 mEq/L (ref 96–112)
GFR calc Af Amer: 80 mL/min — ABNORMAL LOW (ref >90)
Potassium: 4 mEq/L (ref 3.5–5.1)
Sodium: 139 mEq/L (ref 135–145)

## 2012-10-17 LAB — PROTIME-INR
INR: 1.66 — ABNORMAL HIGH (ref 0.00–1.49)
Prothrombin Time: 19.1 seconds — ABNORMAL HIGH (ref 11.6–15.2)

## 2012-10-17 MED ORDER — POLYETHYLENE GLYCOL 3350 17 GM/SCOOP PO POWD: 17.0000 g | Freq: Every day | ORAL | Status: DC

## 2012-10-17 MED ORDER — AMLODIPINE BESYLATE 10 MG PO TABS: 10.0000 mg | ORAL_TABLET | Freq: Every day | ORAL | Status: DC

## 2012-10-17 MED ORDER — INSULIN NPH ISOPHANE & REGULAR (70-30) 100 UNIT/ML ~~LOC~~ SUSP: 10.0000 [IU] | Freq: Two times a day (BID) | SUBCUTANEOUS | Status: DC

## 2012-10-17 MED ORDER — KETOROLAC TROMETHAMINE 10 MG PO TABS: 10.0000 mg | ORAL_TABLET | Freq: Four times a day (QID) | ORAL | Status: DC

## 2012-10-17 MED ORDER — WARFARIN - PHARMACIST DOSING INPATIENT: Freq: Every day | Status: DC

## 2012-10-17 MED ORDER — WARFARIN SODIUM 5 MG PO TABS: 10.0000 mg | ORAL_TABLET | Freq: Once | ORAL | Status: DC

## 2012-10-17 MED ORDER — NICOTINE 14 MG/24HR TD PT24: 1.0000 | MEDICATED_PATCH | Freq: Every day | TRANSDERMAL | Status: DC

## 2012-10-17 MED ORDER — LISINOPRIL-HYDROCHLOROTHIAZIDE 20-25 MG PO TABS: 1.0000 | ORAL_TABLET | Freq: Every day | ORAL | Status: DC

## 2012-10-17 MED ORDER — INSULIN GLARGINE 100 UNIT/ML ~~LOC~~ SOLN: 18.0000 [IU] | Freq: Every day | SUBCUTANEOUS | Status: DC

## 2012-10-17 MED ORDER — WARFARIN SODIUM 2.5 MG PO TABS
12.5000 mg | ORAL_TABLET | Freq: Once | ORAL | Status: AC
  Administered 2012-10-17: 12.5 mg via ORAL
  Filled 2012-10-17: qty 1

## 2012-10-17 MED ORDER — AMITRIPTYLINE HCL 25 MG PO TABS: 12.5000 mg | ORAL_TABLET | Freq: Every day | ORAL | Status: DC

## 2012-10-17 MED ORDER — ENOXAPARIN SODIUM 150 MG/ML ~~LOC~~ SOLN: 105.0000 mg | Freq: Two times a day (BID) | SUBCUTANEOUS | Status: DC

## 2012-10-17 MED ORDER — WARFARIN SODIUM 10 MG PO TABS
12.5000 mg | ORAL_TABLET | Freq: Once | ORAL | Status: DC
  Filled 2012-10-17: qty 1

## 2012-10-17 MED ORDER — ACETAMINOPHEN 325 MG PO TABS: 650.0000 mg | ORAL_TABLET | Freq: Four times a day (QID) | ORAL | Status: DC | PRN

## 2012-10-17 MED ORDER — ENOXAPARIN SODIUM 120 MG/0.8ML ~~LOC~~ SOLN
105.0000 mg | Freq: Two times a day (BID) | SUBCUTANEOUS | Status: DC
  Administered 2012-10-17: 105 mg via SUBCUTANEOUS
  Filled 2012-10-17 (×2): qty 0.8

## 2012-10-17 NOTE — Progress Notes (Signed)
  ANTICOAGULATION CONSULT NOTE - Follow Up Consult  Pharmacy Consult for Lovenox and Coumadin Indication: pulmonary embolus  Allergies  Allergen Reactions  . Keflex (Cephalexin) Itching and Swelling    Lips and eyes swelled up  . Latex Itching  . Sulfa Antibiotics Rash  . Sulfur Rash    Patient Measurements: Height: 5' 8.9" (175 cm) Weight: 233 lb (105.688 kg) IBW/kg (Calculated) : 65.97  Heparin Dosing Weight:   Vital Signs: Temp: 98.2 F (36.8 C) (10/28 0500) BP: 136/74 mmHg (10/28 1025) Pulse Rate: 74  (10/28 0500)  Labs:  Basename 10/17/12 0450 10/16/12 0500 10/15/12 1627 10/15/12 0540  HGB -- 13.2 -- --  HCT -- 39.1 -- --  PLT -- 361 -- --  APTT -- -- -- --  LABPROT 19.1* 16.6* -- 14.7  INR 1.66* 1.38 -- 1.17  HEPARINUNFRC -- -- -- --  CREATININE 0.87 0.80 -- 0.85  CKTOTAL -- -- 39 --  CKMB -- -- -- --  TROPONINI -- -- -- --    Estimated Creatinine Clearance: 85.6 ml/min (by C-G formula based on Cr of 0.87).  Assessment: 61yof on Lovenox bridging to Coumadin for RLL PE (Day 6 of overlap). Coumadin was held on 10/27 PM and Lovenox was placed on hold following 10/27 PM dose for possible biopsy. Biopsy has now been discontinued and orders to restart Lovenox and Coumadin have been placed. INR (1.66) seemed to increase appropriately with Coumadin 12.5mg  dose - will repeat.  - No CBC this AM - No significant bleeding reported - Wt: 105kg - CrCl: 86 ml/min  Goal of Therapy:  INR 2-3 Anti-Xa level 0.6-1.2 units/ml 4hrs after LMWH dose given Monitor platelets by anticoagulation protocol: Yes   Plan:  1. Coumadin 12.5mg  po x 1 today 2. Lovenox 105mg  SQ q12h 3. Follow-up AM INR , CBC  Cleon Dew [phone] 10/17/2012,10:48 AM

## 2012-10-17 NOTE — Progress Notes (Signed)
Subjective: Still complaining of some pain in her right back. She does not have any new complaints today.   Objective: Vital signs in last 24 hours: Filed Vitals:   10/16/12 0500 10/16/12 0943 10/16/12 2100 10/17/12 0500  BP: 151/95 130/87 144/76 168/68  Pulse: 74  72 74  Temp: 98 F (36.7 C)  98.3 F (36.8 C) 98.2 F (36.8 C)  TempSrc:      Resp: 18  18 18   Height:      Weight: 236 lb (107.049 kg)   233 lb (105.688 kg)  SpO2: 92%  97% 98%   Weight change: -3 lb (-1.361 kg)  Intake/Output Summary (Last 24 hours) at 10/17/12 0850 Last data filed at 10/16/12 1200  Gross per 24 hour  Intake    123 ml  Output      0 ml  Net    123 ml   Physical Exam: Constitutional: Vital signs reviewed.  Patient is a well-developed and well-nourished  in no acute distress and cooperative with exam. Alert and oriented x3.  Neck: Supple,  Cardiovascular: RRR, S1 normal, S2 normal, no MRG, pulses symmetric and intact bilaterally Pulmonary/Chest: CTAB, no wheezes, rales, or rhonchi Abdominal: Soft. Non-tender, non-distended,  Musculoskeletal: Mild paraspinal tenderness on palpation more on the left as well as subscapular area.  Neurological: A&O x3, Strenght is normal and symmetric bilaterally,no focal motor deficit, sensory intact to light touch bilaterally.   Lab Results: Basic Metabolic Panel:  Lab 10/17/12 0865 10/16/12 1130 10/16/12 0500 10/11/12 2222  NA 139 -- 138 --  K 4.0 -- 3.4* --  CL 104 -- 102 --  CO2 27 -- 25 --  GLUCOSE 192* -- 143* --  BUN 13 -- 13 --  CREATININE 0.87 -- 0.80 --  CALCIUM 8.8 -- 8.9 --  MG -- 2.0 -- 1.8  PHOS -- -- -- --   Liver Function Tests:  Lab 10/11/12 1604  AST 11  ALT <5  ALKPHOS 126*  BILITOT 0.3  PROT 7.1  ALBUMIN 2.5*    CBC:  Lab 10/16/12 0500 10/13/12 0500 10/12/12 0525  WBC 6.9 9.0 --  NEUTROABS -- -- 4.8  HGB 13.2 12.5 --  HCT 39.1 37.7 --  MCV 83.2 83.4 --  PLT 361 356 --   Cardiac Enzymes:  Lab 10/15/12 1627 10/12/12  0853 10/12/12 0525 10/11/12 2221  CKTOTAL 39 -- -- --  CKMB -- -- -- --  CKMBINDEX -- -- -- --  TROPONINI -- <0.30 <0.30 <0.30   BNP:  Lab 10/11/12 1535  PROBNP 630.6*    CBG:  Lab 10/17/12 0735 10/16/12 2121 10/16/12 1631 10/16/12 1143 10/16/12 0801 10/15/12 2103  GLUCAP 158* 179* 163* 117* 150* 141*    Fasting Lipid Panel:  Lab 10/12/12 0525  CHOL 214*  HDL 33*  LDLCALC 133*  TRIG 239*  CHOLHDL 6.5  LDLDIRECT --   Thyroid Function Tests:  Lab 10/11/12 2222  TSH 1.372  T4TOTAL --  FREET4 --  T3FREE --  THYROIDAB --   Coagulation:  Lab 10/17/12 0450 10/16/12 0500 10/15/12 0540 10/14/12 0610  LABPROT 19.1* 16.6* 14.7 13.7  INR 1.66* 1.38 1.17 1.06    Urine Drug Screen: Drugs of Abuse     Component Value Date/Time   LABOPIA POSITIVE* 10/12/2012 0803   COCAINSCRNUR NONE DETECTED 10/12/2012 0803   LABBENZ NONE DETECTED 10/12/2012 0803   AMPHETMU NONE DETECTED 10/12/2012 0803   THCU NONE DETECTED 10/12/2012 0803   LABBARB NONE DETECTED 10/12/2012 0803  Urinalysis:  Lab 10/12/12 0803  COLORURINE YELLOW  LABSPEC >1.046*  PHURINE 6.0  GLUCOSEU 100*  HGBUR TRACE*  BILIRUBINUR NEGATIVE  KETONESUR NEGATIVE  PROTEINUR >300*  UROBILINOGEN 0.2  NITRITE NEGATIVE  LEUKOCYTESUR NEGATIVE   Medications: I have reviewed the patient's current medications. Scheduled Meds:    . amLODipine  10 mg Oral Daily  . aspirin EC  81 mg Oral Daily  . docusate sodium  1 enema Rectal Once  . enoxaparin (LOVENOX) injection  110 mg Subcutaneous Once  . hydrochlorothiazide  25 mg Oral Daily  . insulin aspart  0-15 Units Subcutaneous TID WC  . insulin glargine  18 Units Subcutaneous QHS  . ketorolac  10 mg Oral Q6H  . losartan  100 mg Oral Daily  . nicotine  14 mg Transdermal Daily  . ondansetron (ZOFRAN) IV  4 mg Intravenous Once  . pantoprazole  40 mg Oral Daily  . polyethylene glycol  17 g Oral Daily  . potassium chloride  40 mEq Oral Once  . sodium chloride  3  mL Intravenous Q12H  . DISCONTD: enoxaparin (LOVENOX) injection  110 mg Subcutaneous BID  . DISCONTD: polyethylene glycol powder  17 g Oral Daily  . DISCONTD: Warfarin - Pharmacist Dosing Inpatient   Does not apply q1800   Continuous Infusions:  PRN Meds:.acetaminophen, feeding supplement, ondansetron (ZOFRAN) IV, ondansetron, DISCONTD: HYDROcodone-acetaminophen Assessment/Plan: Joy Butler is a 61 year old woman with a PMH significant for Diabetes, hypertension, PE, and recent Splenic infarct who presents because of left sided aching chest pain for 24 hours.   Right lower lobe PE: Previously on Coumadin but she got off Coumadin. Plan  - This is day day 7 of coumadin and Lovenox overlap therapy. 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 Alexandria Lodge who has accepted to provide Lovenox for the next 5 days. -Patient will be discharged today on Coumadin, and Lovenox. She'll followup with Dr. Alexandria Lodge in 4 days. Patient has experience with Lovenox injections.  Abnormal MRI of thorax :  Repeat MRI of the thoracic spine indicate some progression her paraspinal diseases compared to last month. Interventional radiologists have reviewed the previous MRI, and the current MRI, and they believe that there is a fracture of the osteophyte at the level of T8. This fracture is believed to be causing signal abnormality as opposed to an infectious process in her spine. There'll be no need to do a biopsy on this abnormality. Plan  - Followup MRI in 3 months - add Ketolorac as this will help with inflammation  Left Chest pain - resolved but now has subscapula as well as paraspinal pain:  Plan  - will continue to monitor clinically   Diabetes Mellitius: Hgbg A1c  10.4. Home dose Lantus 15 U. On admission she was restarted Lantus at 15 UI with a meal coverage. Her blood sugars continue to remain elevated in 170s. Lantus was increased to 18 on 10/25.   Plan    - She'll be discharged on NPH and NovoLog insulin as this is more affordable.  -Her medications will be reviewed as outpatient.   Peripheral neuropathies: Patient reports that she has had a long history of peripheral neuropathy. She reports that her doctor advised her to walk from [place] to the [place] that the warm weather there would improve her peripheral neuropathy. However, she has continued experiencing peripheral neuropathies involving the feet in the socks distribution. She also reports that her legs  sometimes give way with a recent history of a fall.  Plan  -This patient would benefit from pregabalin however, the cost might be prohibitive. I would, therefore recommend a more affordable option that could be effective like amitriptyline starting at 12.5 mg at night.   HTN: She is hypertensive today and states that she has been out of her medications for at least 2 weeks  prior to admission. BP is elevated.  Plan  -Continue with Losartan, Amlodipine   - Restart home dose HCTZ   Smoking: Joy Butler reports history of smoking about 6-7 cigarettes per day. In the past quit for 2 years. However, she relapsed over the last year. She has desire to quit with assistance.  Plan  - continue with Nicotine patch.   Low material resources: She has been intermittently homeless over the last two months since moving to [facility] but recently found a place to live but per her report it did not come with appliances which they are saving to try to afford. She has not been able to afford her medications on her fixed income but has [employer] to help her pay for her medications.  Per case manager since patient has two insurance coverage she is not eligible for any assistance from the hospital. Plan -She has been provided with information regarding finding Physicians in within the [place] area, who will be able to accept her insurance. -She'll have a hospital followup visit at the internal medicine  clinic at [facility].   DVT: warfarin and Lovenox     LOS: 6 days   Dow Adolph 10/17/2012, 8:50 AM

## 2012-10-17 NOTE — Progress Notes (Signed)
Internal Medicine Attending Progress Note Date: 10/17/2012  Patient name: Joy Butler Medical record number: [id number] Date of birth: February 27, 1951 Age: 61 y.o. Gender: female  Late entry from this morning.  S: Without new complaints today.  Continues to have back pain.  Physical Exam - key components related to admission:  Filed Vitals:   10/16/12 0943 10/16/12 2100 10/17/12 0500 10/17/12 1025  BP: 130/87 144/76 168/68 136/74  Pulse:  72 74   Temp:  98.3 F (36.8 C) 98.2 F (36.8 C)   TempSrc:      Resp:  18 18   Height:      Weight:   233 lb (105.688 kg)   SpO2:  97% 98%    Gen: WDWN woman lying comfortably in bed in NAD. Back: Mild left paraspinous tenderness to palpation without overlying warmth and erythema  Lab results:  Basic Metabolic Panel:  Basename 10/17/12 0450 10/16/12 1130 10/16/12 0500  NA 139 -- 138  K 4.0 -- 3.4*  CL 104 -- 102  CO2 27 -- 25  GLUCOSE 192* -- 143*  BUN 13 -- 13  CREATININE 0.87 -- 0.80  CALCIUM 8.8 -- 8.9  MG -- 2.0 --  PHOS -- -- --   CBG:  Basename 10/17/12 1159 10/17/12 0735 10/16/12 2121 10/16/12 1631 10/16/12 1143 10/16/12 0801  GLUCAP 170* 158* 179* 163* 117* 150*   Coagulation:  Basename 10/17/12 0450 10/16/12 0500  INR 1.66* 1.38   Assessment & Plan:  Pulmonary Embolism:  Currently sub-therapeutic INR.  Dosing of coumadin per Pharmacy with Lovenox bridge until therapeutic for 48 hours.  Please see Dr. Huntley Dec note for outline of immediate outpatient monitoring and management.  The importance of compliance was stressed given seriousness of thromboembolic disease.  With recurrence she will likely require lifelong anticoagulation.  Back Pain: Likely secondary to a fractured osteophyte.  Will manage with NSAID and PPI given she is now on coumadin.  Diabetes: Discharge on home dose.  HTN: Discharge on home dose.  Disposition: Discharge home today with follow-up in the [facility].

## 2012-10-18 NOTE — Progress Notes (Signed)
Chart review complete.  Patient is not eligible for [facility] Care Management services because his/her PCP is not a [facility] primary care provider or is not [facility] affiliated.  For any additional questions or new referrals please contact Tim Henderson BSN RN [facility] Liaison at [phone] °

## 2012-10-19 LAB — ALDOLASE: Aldolase: 5.6 U/L (ref <=8.1)

## 2012-10-20 ENCOUNTER — Encounter (HOSPITAL_COMMUNITY): Payer: Self-pay | Admitting: Emergency Medicine

## 2012-10-20 ENCOUNTER — Emergency Department (HOSPITAL_COMMUNITY)
Admission: EM | Admit: 2012-10-20 | Discharge: 2012-10-20 | Disposition: A | Discharge status: Home / Self Care | Payer: Medicare Other | Source: Home / Self Care | Attending: Emergency Medicine | Admitting: Emergency Medicine

## 2012-10-20 DIAGNOSIS — K5641 Fecal impaction: Secondary | ICD-10-CM

## 2012-10-20 DIAGNOSIS — K625 Hemorrhage of anus and rectum: Secondary | ICD-10-CM

## 2012-10-20 MED ORDER — DOCUSATE SODIUM 100 MG PO CAPS: 100.0000 mg | ORAL_CAPSULE | Freq: Two times a day (BID) | ORAL | Status: DC

## 2012-10-20 MED ORDER — DISPOSABLE ENEMA 19-7 GM/118ML RE ENEM: ENEMA | RECTAL | Status: DC

## 2012-10-20 NOTE — ED Provider Notes (Signed)
History     CSN: [id number]  Arrival date & time 10/20/12  1022   First MD Initiated Contact with Patient 10/20/12 1122      Chief Complaint  Patient presents with  . Fecal Impaction    pt states that last bm was either fri or sat. can feel bowel but unable to pass.    (Consider location/radiation/quality/duration/timing/severity/associated sxs/prior treatment) Patient is a 61 y.o. female presenting with constipation. The history is provided by the patient. No language interpreter was used.  Constipation  The current episode started 5 to 7 days ago. The onset was gradual. The stool is described as hard. There was no prior successful therapy.  Pt complains of impaction.   Pt reports she is unable to pass stool.  Past Medical History  Diagnosis Date  . Diabetes mellitus     diagnosed in 2000.    Marland Kitchen Hypertension   . Pulmonary embolism     2011 treated at [facility] in [place].  Was on Coumadin for over a year.  no known family history  . UTI (urinary tract infection)   . High cholesterol     Past Surgical History  Procedure Date  . Abdominal hysterectomy   . Cholecystectomy     1980  . Esophagogastroduodenoscopy 08/19/2012    Procedure: ESOPHAGOGASTRODUODENOSCOPY (EGD);  Surgeon: Vertell Novak., MD;  Location: [facility] ENDOSCOPY;  Service: Endoscopy;  Laterality: N/A;  . Cesarean section   . Hand surgery     Family History  Problem Relation Age of Onset  . Adopted: Yes    History  Substance Use Topics  . Smoking status: Current Every Day Smoker -- 0.2 packs/day for 10 years    Types: Cigarettes  . Smokeless tobacco: Never Used  . Alcohol Use: No    OB History    Grav Para Term Preterm Abortions TAB SAB Ect Mult Living                  Review of Systems  Gastrointestinal: Positive for constipation.  All other systems reviewed and are negative.    Allergies  Keflex; Latex; Sulfa antibiotics; and Sulfur  Home Medications   Current Outpatient Rx  Name  Route Sig Dispense Refill  . AMLODIPINE BESYLATE 10 MG PO TABS Oral Take 1 tablet (10 mg total) by mouth daily. 30 tablet 2  . INSULIN ISOPHANE & REGULAR (70-30) 100 UNIT/ML [place] SUSP Subcutaneous Inject 10 Units into the skin 2 (two) times daily with a meal. 10 mL 12  . LISINOPRIL-HYDROCHLOROTHIAZIDE 20-25 MG PO TABS Oral Take 1 tablet by mouth daily. 30 tablet 2  . NICOTINE 14 MG/24HR TD PT24 Transdermal Place 1 patch onto the skin daily. 28 patch 0  . WARFARIN SODIUM 5 MG PO TABS Oral Take 2 tablets (10 mg total) by mouth one time only at 6 PM. 10 tablet 0  . ACETAMINOPHEN 325 MG PO TABS Oral Take 2 tablets (650 mg total) by mouth every 6 (six) hours as needed for pain. 30 tablet 0  . AMITRIPTYLINE HCL 25 MG PO TABS Oral Take 0.5 tablets (12.5 mg total) by mouth at bedtime. 7 tablet 2  . ENOXAPARIN SODIUM 150 MG/ML [place] SOLN Subcutaneous Inject 0.7 mLs (105 mg total) into the skin every 12 (twelve) hours. 14 Syringe 0  . KETOROLAC TROMETHAMINE 10 MG PO TABS Oral Take 1 tablet (10 mg total) by mouth every 6 (six) hours. 20 tablet 0  . METHOCARBAMOL 500 MG PO TABS Oral  Take 1 tablet (500 mg total) by mouth 3 (three) times daily. 45 tablet 0  . POLYETHYLENE GLYCOL 3350 PO POWD Oral Take 17 g by mouth daily. 255 g 0    BP 123/81  Pulse 108  Temp 97.8 F (36.6 C) (Oral)  SpO2 100%  Physical Exam  Nursing note and vitals reviewed. Constitutional: She appears well-developed and well-nourished.  Cardiovascular: Normal rate.   Pulmonary/Chest: Effort normal.  Abdominal: Soft.  Genitourinary:       Rectal brown stool,  Soft,    Musculoskeletal: Normal range of motion.  Skin: Skin is warm.  Psychiatric: She has a normal mood and affect.    ED Course  Procedures (including critical care time)  Labs Reviewed - No data to display No results found.   No diagnosis found.    MDM  I disimpaced pt manually,   Small amount of bright red blood with disimpaction.    Pt is on coumadin.   I  advised pt to watch carefully to make sure bleeding resolves.   Stool is brown,   Pt recently seen by Dr. Jalene Mullet.   I advised pt to see Dr. Randa Evens for recheck.         Lonia Skinner [place], [place] 10/20/12 1152

## 2012-10-20 NOTE — ED Notes (Addendum)
Pt states that she was just recently in the hospital for back and side pain.  Pt states that she is having severe pain and unable to pass bowel.   States that she can feel bowel at opening.  Last bm was either fri or sat, per pt.  Pt has tried stool softner/laxative this a.m with no relief.  Hx of fecal impaction.

## 2012-10-20 NOTE — ED Provider Notes (Signed)
Medical screening examination/treatment/procedure(s) were performed by non-physician practitioner and as supervising physician I was immediately available for consultation/collaboration.  Kayvion Arneson   Aminat Shelburne, MD 10/20/12 1230 

## 2012-10-21 ENCOUNTER — Ambulatory Visit (INDEPENDENT_AMBULATORY_CARE_PROVIDER_SITE_OTHER): Payer: Medicare Other | Admitting: Internal Medicine

## 2012-10-21 ENCOUNTER — Encounter: Payer: Self-pay | Admitting: Internal Medicine

## 2012-10-21 VITALS — BP 122/80 | HR 103 | Temp 97.0°F | Resp 20 | Ht 69.0 in | Wt 235.7 lb

## 2012-10-21 DIAGNOSIS — E119 Type 2 diabetes mellitus without complications: Secondary | ICD-10-CM

## 2012-10-21 DIAGNOSIS — Z7901 Long term (current) use of anticoagulants: Secondary | ICD-10-CM

## 2012-10-21 DIAGNOSIS — I2699 Other pulmonary embolism without acute cor pulmonale: Secondary | ICD-10-CM

## 2012-10-21 DIAGNOSIS — Z79899 Other long term (current) drug therapy: Secondary | ICD-10-CM

## 2012-10-21 DIAGNOSIS — M549 Dorsalgia, unspecified: Secondary | ICD-10-CM

## 2012-10-21 DIAGNOSIS — G8929 Other chronic pain: Secondary | ICD-10-CM

## 2012-10-21 DIAGNOSIS — I10 Essential (primary) hypertension: Secondary | ICD-10-CM

## 2012-10-21 LAB — POCT INR: INR: 1.2

## 2012-10-21 LAB — CULTURE, BLOOD (ROUTINE X 2)
Culture: NO GROWTH
Culture: NO GROWTH

## 2012-10-21 LAB — POCT GLYCOSYLATED HEMOGLOBIN (HGB A1C): Hemoglobin A1C: 8.8

## 2012-10-21 LAB — GLUCOSE, CAPILLARY: Glucose-Capillary: 162 mg/dL — ABNORMAL HIGH (ref 70–99)

## 2012-10-21 MED ORDER — WARFARIN SODIUM 5 MG PO TABS: 10.0000 mg | ORAL_TABLET | Freq: Every day | ORAL | Status: DC

## 2012-10-21 MED ORDER — WARFARIN SODIUM 5 MG PO TABS: 10.0000 mg | ORAL_TABLET | Freq: Once | ORAL | Status: DC

## 2012-10-21 MED ORDER — OMEPRAZOLE 20 MG PO CPDR: 20.0000 mg | DELAYED_RELEASE_CAPSULE | Freq: Every day | ORAL | Status: DC

## 2012-10-21 NOTE — Patient Instructions (Signed)
Please you will be contacted for Physiotherapy for your back pain  Please you get your refills from the pharmacy  Please call clinic if you have challenges finding a doctor in the community to be your primary doctor Please start taking Omeprazole at 20mg  once daily while you are on Toradol  Please see DR Alexandria Lodge today

## 2012-10-22 ENCOUNTER — Encounter: Payer: Self-pay | Admitting: Internal Medicine

## 2012-10-22 NOTE — Assessment & Plan Note (Signed)
This patient was discharged in the hospital one week ago on Lovenox and Coumadin 10 mg once daily. She reports compliance with her Lovenox injections but she has not taking Coumadin because she was unable to refill her prescription. Her INR is 1.2 today in the clinic. A lengthy discussion has been held with the patient with me and Dr. Alexandria Lodge regarding the implications of her not being on proper therapy for anticoagulation. The patient has been informed that she has a life-threatening condition of pulmonary embolism and the need to comply with the medications and to maintain the INR between the therapeutic ranges of 2-3. The patient's husband was in the room.   Plan -She has been supplied with Lovenox injections for the next 4 days. -A written prescription of Coumadin has been given to the patient with instructions to start taking 10 mg daily. This medication is available on the [employer] four dollar list, which the patient indicates that she can afford. - The patient will come back to see Dr. Alexandria Lodge in 3 days.

## 2012-10-22 NOTE — Assessment & Plan Note (Signed)
Her blood pressure in the clinic is at goal. BP equals 122/80.  Plan -To continue with amlodipine at 10 mg once daily, and lisinopril with hydrochlorothiazide 20-25 mg. Both these medication are on the [employer] four drug list.

## 2012-10-22 NOTE — Assessment & Plan Note (Signed)
The patient reports compliance with insulin injections as prescribed on discharge. She was discharged on 10 international units twice daily of NovoLog 70/30. HbA1c today is 8.8% and the blood sugar is 162. This patient to require close followup for this diagnosis of diabetes. She is trying to identify her PCP in the community. She's been encouraged to identify the PCP and if she fails she can call the clinic for further assistance.

## 2012-10-22 NOTE — Progress Notes (Signed)
Patient ID: Joy Butler, female   DOB: 1951/01/18, 61 y.o.   MRN: [id number]  Subjective:   Patient ID: Joy Butler female   DOB: 1951-12-07 61 y.o.   MRN: [id number]  HPI: Joy Butler is a 61 y.o. woman with past medical history of diabetes, chronic back pain, hypertension, and recurrent pulmonary embolism, presents for hospital followup visit.  She is complaining of back pain, which has persisted since discharge, with a severity of  5/10. She does not have other complaints. Specifically, she denies shortness of breath, chest pain, or palpitations. She does not have cough.  She was recently discharged from the hospital with a diagnosis of the new pulmonary embolism on Lovenox injections and Coumadin. At discharge her INR was subtherapeutic at 1.66. However, the patient reports that she was unable to get her Coumadin filled because she did not have insurance card. The card has been mailed to her today and she hopes to go and get her Coumadin. She reports, that she's been compliant with her Lovenox injections. She also reports that she was unable to get the rest of her medications including insulin, and lisinopril-hydrochlorothiazide.    Past Medical History  Diagnosis Date  . Diabetes mellitus     diagnosed in 2000.    Marland Kitchen Hypertension   . Pulmonary embolism     2011 treated at [facility] in [place].  Was on Coumadin for over a year.  no known family history  . UTI (urinary tract infection)   . High cholesterol    Current Outpatient Prescriptions  Medication Sig Dispense Refill  . enoxaparin (LOVENOX) 150 MG/ML injection Inject 0.7 mLs (105 mg total) into the skin every 12 (twelve) hours.  14 Syringe  0  . insulin NPH-insulin regular (NOVOLIN 70/30) (70-30) 100 UNIT/ML injection Inject 10 Units into the skin 2 (two) times daily with a meal.  10 mL  12  . ketorolac (TORADOL) 10 MG tablet Take 1 tablet (10 mg total) by mouth every 6 (six) hours.  20 tablet  0  . polyethylene  glycol powder (MIRALAX) powder Take 17 g by mouth daily.  255 g  0  . sodium phosphate (FLEET) enema follow package directions.  Use one enema now,   Repeat in 12 hours  135 mL  2  . acetaminophen (TYLENOL) 325 MG tablet Take 2 tablets (650 mg total) by mouth every 6 (six) hours as needed for pain.  30 tablet  0  . amitriptyline (ELAVIL) 25 MG tablet Take 0.5 tablets (12.5 mg total) by mouth at bedtime.  7 tablet  2  . amLODipine (NORVASC) 10 MG tablet Take 1 tablet (10 mg total) by mouth daily.  30 tablet  2  . docusate sodium (COLACE) 100 MG capsule Take 1 capsule (100 mg total) by mouth every 12 (twelve) hours.  60 capsule  0  . lisinopril-hydrochlorothiazide (PRINZIDE,ZESTORETIC) 20-25 MG per tablet Take 1 tablet by mouth daily.  30 tablet  2  . methocarbamol (ROBAXIN) 500 MG tablet Take 1 tablet (500 mg total) by mouth 3 (three) times daily.  45 tablet  0  . nicotine (NICODERM CQ - DOSED IN MG/24 HOURS) 14 mg/24hr patch Place 1 patch onto the skin daily.  28 patch  0  . omeprazole (PRILOSEC) 20 MG capsule Take 1 capsule (20 mg total) by mouth daily.  30 capsule  1  . warfarin (COUMADIN) 5 MG tablet Take 2 tablets (10 mg total) by mouth one time only at  6 PM.  62 tablet  0   Family History  Problem Relation Age of Onset  . Adopted: Yes   History   Social History  . Marital Status: Single    Spouse Name: N/A    Number of Children: N/A  . Years of Education: N/A   Social History Main Topics  . Smoking status: Current Every Day Smoker -- 0.2 packs/day for 10 years    Types: Cigarettes  . Smokeless tobacco: Never Used  . Alcohol Use: No  . Drug Use: No  . Sexually Active: Not Currently   Other Topics Concern  . None   Social History Narrative   Previously divorced now engaged with her partner of 4 years.  Has 6 grown children with 21 grandchildren.  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  Moved to [place] in August 2013 and was transiently  homeless until first part of October 2013.          Review of Systems: Review of Systems  Constitutional: Negative for fever and chills.  HENT: Negative.   Eyes: Negative.   Respiratory: Negative for cough, hemoptysis, shortness of breath and wheezing.   Cardiovascular: Negative for chest pain, palpitations, orthopnea, claudication, leg swelling and PND.  Gastrointestinal: Positive for heartburn. Negative for nausea, abdominal pain, blood in stool and melena.  Genitourinary: Negative.   Musculoskeletal: Positive for back pain. Negative for joint pain.  Skin: Negative.   Neurological: Negative.  Negative for headaches.  Endo/Heme/Allergies: Negative for environmental allergies and polydipsia. Does not bruise/bleed easily.  Psychiatric/Behavioral: Negative.    Physical Exam  Filed Vitals:   10/21/12 1002  BP: 122/80  Pulse: 103  Temp: 97 F (36.1 C)  Resp: 20    Physical Exam  Vitals reviewed. Constitutional: She is oriented to person, place, and time. She appears well-developed and well-nourished. No distress.  HENT:  Head: Normocephalic and atraumatic.  Eyes: EOM are normal. Pupils are equal, round, and reactive to light.  Neck: Normal range of motion. Neck supple.  Cardiovascular: Normal rate and regular rhythm.   Pulmonary/Chest: Effort normal and breath sounds normal.  Abdominal: Soft. Bowel sounds are normal.  Musculoskeletal: She exhibits no edema and no tenderness.  Neurological: She is alert and oriented to person, place, and time.  Skin: She is not diaphoretic. No pallor.  Psychiatric: She has a normal mood and affect. Thought content normal.   Objective:  Physical Exam: Filed Vitals:   10/21/12 1002  BP: 122/80  Pulse: 103  Temp: 97 F (36.1 C)  TempSrc: Oral  Resp: 20  Height: 5\' 9"  (1.753 m)  Weight: 235 lb 11.2 oz (106.913 kg)  SpO2: 98%   Assessment & Plan:  Assessment and plan for this patient has been discussed with Dr. Rogelia Boga, as detailed  under each problem.

## 2012-10-22 NOTE — Assessment & Plan Note (Signed)
The cause of this patient's back pain has been established on the MRI, as the fracture of T8 is osteophyte with inflammatory reaction of the paraspinal tissue. The patient continues to complain of pain. She was unable to refill her medications, including acetaminophen, and toradol. Plan -I have written a referral for this patient to receive outpatient physical therapy. The physical therapy office will contact her by phone to schedule an appointment. -The patient will refill her prescriptions today since she has received her insurance card. -Of note, this patient has she has chronic constipation, requiring manual fecal disimpaction 2 days ago at urgent care. She does not report relief with a MiraLax for constipation. Given this history of constipation, the patient would need a stronger stool softer if she is to be switched on opiates for pain relief.

## 2012-10-23 NOTE — Progress Notes (Signed)
I saw, examined, and discussed the patient with Dr Kirtland Bouchard and agree with the note contained here. The inpt team requested that the pt been seen once in [facility] for HFU while she located a community PCP. Due to confusion over her anti-coag, we will have to see her at least once more.

## 2012-10-24 ENCOUNTER — Ambulatory Visit (INDEPENDENT_AMBULATORY_CARE_PROVIDER_SITE_OTHER): Payer: Medicare Other | Admitting: Pharmacist

## 2012-10-24 DIAGNOSIS — I2699 Other pulmonary embolism without acute cor pulmonale: Secondary | ICD-10-CM

## 2012-10-24 DIAGNOSIS — Z7901 Long term (current) use of anticoagulants: Secondary | ICD-10-CM

## 2012-10-24 LAB — POCT INR: INR: 2.2

## 2012-10-24 NOTE — Progress Notes (Signed)
Anti-Coagulation Progress Note  Joy Butler is a 61 y.o. female who is currently on an anti-coagulation regimen.    RECENT RESULTS: Recent results are below, the most recent result is correlated with a dose of 10 mg.daily since last Friday's visit. She has a documented history (established last Friday) of medical non-compliance. She had been given Lovenox 100mg  SQ q12h at DISCHARGE from her acute (index) hospitalization for her indication of pulmonary embolism. At dishcarge, she did NOT get her warfarin prescription filled. She continued taking her Lovenox injections until such time she was seen by her PCP last Friday in [facility] At that time--given she had NOT COMMENCED UPON WARFARIN--we had to re-supply her with CONTINUED LOVENOX. We spoke at length of the absolute necessity of her having to get her warfarin prescription filled/dispensed. Her PCP and I advocated for a local pharmacy known to have a "$4" plan. She was provided a written Rx. Today she indicates she was able to have her warfarin Rx dispensed on the $4 plan at her pharmacy. She even today commenced the visit by citing that the transportation she would be reliant upon has moved from the area. She was advised/reinforced that in our efforts to keep her INR in target range for both efficacy and safety--that the required visits to the [facility] anticoagulation clinic will be the sine qua none upon which this rests. She verbalized understanding---though when offered an opportunity to see [facility] for transportation related issues she cited she had to go at present time because the person providing today's transportation had another appointment to make. We will have to continue to reinforce compliance to her.  Lab Results  Component Value Date   INR 2.20 10/24/2012   INR 1.2 10/21/2012   INR 1.66* 10/17/2012    ANTI-COAG DOSE:   Latest dosing instructions   Total Sun Mon Tue Wed Thu Fri Sat   47.5 5 mg 7.5 mg 7.5 mg 7.5 mg 7.5 mg 7.5 mg 5 mg    (5 mg1) (5 mg1.5) (5 mg1.5) (5 mg1.5) (5 mg1.5) (5 mg1.5) (5 mg1)         ANTICOAG SUMMARY: Anticoagulation Episode Summary              Current INR goal 2.0-3.0 Next INR check 10/31/2012   INR from last check 2.20 (10/24/2012)     Weekly max dose (mg)  Target end date Indefinite   Indications Personal history of PE (pulmonary embolism) (Resolved) [V12.55], 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 Duration of therapy should be indefinite but with periodic assessment and advice and consent of the patient insofar as continuing indefiite warfarin therapy as per guidelines published in supplement to Journal CHEST.            ANTICOAG TODAY: Anticoagulation Summary as of 10/24/2012              INR goal 2.0-3.0     Selected INR 2.20 (10/24/2012) Next INR check 10/31/2012   Weekly max dose (mg)  Target end date Indefinite   Indications Personal history of PE (pulmonary embolism) (Resolved) [V12.55], Recurrent pulmonary embolism [415.19], Long term (current) use of anticoagulants [V58.61]    Anticoagulation Episode Summary              INR check location  Preferred lab    Send  INR reminders to    Comments Documented problem list reveals recurrence of PE. Duration of therapy should be indefinite but with periodic assessment and advice and consent of the patient insofar as continuing indefiite warfarin therapy as per guidelines published in supplement to Journal CHEST.            PATIENT INSTRUCTIONS: Patient Instructions  Patient instructed to take medications as defined in the Anti-coagulation Track section of this encounter.  Patient instructed to take today's dose.  Patient verbalized understanding of these instructions.        FOLLOW-UP Return in 7 days (on 10/31/2012) for Follow up INR at  0915h.  Hulen Luster, III Pharm.D., CACP

## 2012-10-24 NOTE — Patient Instructions (Signed)
Patient instructed to take medications as defined in the Anti-coagulation Track section of this encounter.  Patient instructed to take today's dose.  Patient verbalized understanding of these instructions.    

## 2012-10-31 ENCOUNTER — Ambulatory Visit (INDEPENDENT_AMBULATORY_CARE_PROVIDER_SITE_OTHER): Payer: Medicare Other | Admitting: Pharmacist

## 2012-10-31 DIAGNOSIS — Z7901 Long term (current) use of anticoagulants: Secondary | ICD-10-CM

## 2012-10-31 DIAGNOSIS — I2699 Other pulmonary embolism without acute cor pulmonale: Secondary | ICD-10-CM

## 2012-10-31 LAB — POCT INR: INR: 2

## 2012-10-31 NOTE — Patient Instructions (Signed)
Patient instructed to take medications as defined in the Anti-coagulation Track section of this encounter.  Patient instructed to take today's dose.  Patient verbalized understanding of these instructions.    

## 2012-10-31 NOTE — Progress Notes (Signed)
Anti-Coagulation Progress Note  Joy Butler is a 61 y.o. female who is currently on an anti-coagulation regimen.    RECENT RESULTS: Recent results are below, the most recent result is correlated with a dose of 47.5 mg. per week: Lab Results  Component Value Date   INR 2.0 10/31/2012   INR 2.20 10/24/2012   INR 1.2 10/21/2012    ANTI-COAG DOSE:   Latest dosing instructions   Total Sun Mon Tue Wed Thu Fri Sat   52.5 7.5 mg 7.5 mg 7.5 mg 7.5 mg 7.5 mg 7.5 mg 7.5 mg    (5 mg1.5) (5 mg1.5) (5 mg1.5) (5 mg1.5) (5 mg1.5) (5 mg1.5) (5 mg1.5)         ANTICOAG SUMMARY: Anticoagulation Episode Summary              Current INR goal 2.0-3.0 Next INR check 11/14/2012   INR from last check 2.0 (10/31/2012)     Weekly max dose (mg)  Target end date Indefinite   Indications Personal history of PE (pulmonary embolism) (Resolved) [V12.55], 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 Duration of therapy should be indefinite but with periodic assessment and advice and consent of the patient insofar as continuing indefiite warfarin therapy as per guidelines published in supplement to Journal CHEST.            ANTICOAG TODAY: Anticoagulation Summary as of 10/31/2012              INR goal 2.0-3.0     Selected INR 2.0 (10/31/2012) Next INR check 11/14/2012   Weekly max dose (mg)  Target end date Indefinite   Indications Personal history of PE (pulmonary embolism) (Resolved) [V12.55], Recurrent pulmonary embolism [415.19], Long term (current) use of anticoagulants [V58.61]    Anticoagulation Episode Summary              INR check location  Preferred lab    Send INR reminders to    Comments Documented problem list reveals recurrence of PE. Duration of therapy should be indefinite but with periodic assessment and  advice and consent of the patient insofar as continuing indefiite warfarin therapy as per guidelines published in supplement to Journal CHEST.            PATIENT INSTRUCTIONS: Patient Instructions  Patient instructed to take medications as defined in the Anti-coagulation Track section of this encounter.  Patient instructed to take today's dose.  Patient verbalized understanding of these instructions.        FOLLOW-UP Return in 2 weeks (on 11/14/2012) for Follow up INR at 2PM.  Hulen Luster, III Pharm.D., CACP

## 2012-11-01 ENCOUNTER — Encounter: Payer: Self-pay | Admitting: *Deleted

## 2012-11-14 ENCOUNTER — Ambulatory Visit (INDEPENDENT_AMBULATORY_CARE_PROVIDER_SITE_OTHER): Payer: Medicare Other | Admitting: Pharmacist

## 2012-11-14 DIAGNOSIS — Z7901 Long term (current) use of anticoagulants: Secondary | ICD-10-CM

## 2012-11-14 DIAGNOSIS — I2699 Other pulmonary embolism without acute cor pulmonale: Secondary | ICD-10-CM

## 2012-11-14 LAB — POCT INR: INR: 1.4

## 2012-11-14 NOTE — Patient Instructions (Signed)
Patient instructed to take medications as defined in the Anti-coagulation Track section of this encounter.  Patient instructed to take today's dose.  Patient verbalized understanding of these instructions.    

## 2012-11-14 NOTE — Progress Notes (Signed)
Anti-Coagulation Progress Note  Joy Butler is a 61 y.o. female who is currently on an anti-coagulation regimen.    RECENT RESULTS: Recent results are below, the most recent result is correlated with a dose of 52.5 mg. per week: Lab Results  Component Value Date   INR 1.40 11/14/2012   INR 2.0 10/31/2012   INR 2.20 10/24/2012    ANTI-COAG DOSE:   Latest dosing instructions   Total Sun Mon Tue Wed Thu Fri Sat   62.5 7.5 mg 10 mg 10 mg 10 mg 10 mg 7.5 mg 7.5 mg    (5 mg1.5) (5 mg2) (5 mg2) (5 mg2) (5 mg2) (5 mg1.5) (5 mg1.5)         ANTICOAG SUMMARY: Anticoagulation Episode Summary              Current INR goal 2.0-3.0 Next INR check 12/05/2012   INR from last check 1.40! (11/14/2012)     Weekly max dose (mg)  Target end date Indefinite   Indications Personal history of PE (pulmonary embolism) (Resolved) [V12.55], 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 Duration of therapy should be indefinite but with periodic assessment and advice and consent of the patient insofar as continuing indefiite warfarin therapy as per guidelines published in supplement to Journal CHEST.            ANTICOAG TODAY: Anticoagulation Summary as of 11/14/2012              INR goal 2.0-3.0     Selected INR 1.40! (11/14/2012) Next INR check 12/05/2012   Weekly max dose (mg)  Target end date Indefinite   Indications Personal history of PE (pulmonary embolism) (Resolved) [V12.55], Recurrent pulmonary embolism [415.19], Long term (current) use of anticoagulants [V58.61]    Anticoagulation Episode Summary              INR check location  Preferred lab    Send INR reminders to    Comments Documented problem list reveals recurrence of PE. Duration of therapy should be indefinite but with periodic assessment and advice and  consent of the patient insofar as continuing indefiite warfarin therapy as per guidelines published in supplement to Journal CHEST.            PATIENT INSTRUCTIONS: Patient Instructions  Patient instructed to take medications as defined in the Anti-coagulation Track section of this encounter.  Patient instructed to take today's dose.  Patient verbalized understanding of these instructions.        FOLLOW-UP Return in 3 weeks (on 12/05/2012) for Follow up INR at 2PM.  Hulen Luster, III Pharm.D., CACP

## 2012-11-22 NOTE — Addendum Note (Signed)
Addended by: Neomia Dear on: 11/22/2012 05:08 PM   Modules accepted: Orders

## 2012-11-25 ENCOUNTER — Emergency Department (INDEPENDENT_AMBULATORY_CARE_PROVIDER_SITE_OTHER)
Admission: EM | Admit: 2012-11-25 | Discharge: 2012-11-25 | Disposition: A | Discharge status: Home / Self Care | Payer: Medicare Other | Source: Home / Self Care | Attending: Family Medicine | Admitting: Family Medicine

## 2012-11-25 ENCOUNTER — Encounter (HOSPITAL_COMMUNITY): Payer: Self-pay | Admitting: Emergency Medicine

## 2012-11-25 ENCOUNTER — Other Ambulatory Visit: Payer: Self-pay

## 2012-11-25 DIAGNOSIS — J069 Acute upper respiratory infection, unspecified: Secondary | ICD-10-CM

## 2012-11-25 LAB — POCT I-STAT, CHEM 8
BUN: 14 mg/dL (ref 6–23)
Calcium, Ion: 1.21 mmol/L (ref 1.13–1.30)
Chloride: 107 mEq/L (ref 96–112)
HCT: 47 % — ABNORMAL HIGH (ref 36.0–46.0)
Sodium: 141 mEq/L (ref 135–145)
TCO2: 28 mmol/L (ref 0–100)

## 2012-11-25 NOTE — ED Provider Notes (Signed)
History     CSN: [id number]  Arrival date & time 11/25/12  1707   First MD Initiated Contact with Patient 11/25/12 1845      Chief Complaint  Patient presents with  . Fall    (Consider location/radiation/quality/duration/timing/severity/associated sxs/prior treatment) HPI Comments: 61 year old female with complex medical history including chronic anticoagulation for recurrent PE, diabetes and  hypertension. Here with several vague complaints.  #1 patient reports that she's been feeling weak in general for the last few weeks, reports that she tripped and fell about 3 days ago without hurting her head or causing any injury to her body. She denies chest pain shortness of breath syncope or palpitations before she fell. She states that she just felt weak and her legs "gave out" She denies losing consciousness. States that she has a history of peripheral neuropathy and sometimes her legs hurt. She uses insulin and has not ran out. Denies hypoglycemic events. Her blood sugar at home has been around 150-200's when checked randomly. #2 reports nasal congestion and right ear pain for the last 2 days. Denies coughing, wheezing, shortness of breath or chest pain. No fever or chills.  Appetite is at baseline but reports poor fluid intake in last 2 days. No abdominal pain nausea, vomiting or diarrhea . No dysuria frequency or hematuria.   Past Medical History  Diagnosis Date  . Diabetes mellitus     diagnosed in 2000.    Marland Kitchen Hypertension   . Pulmonary embolism     2011 treated at [facility] in [place].  Was on Coumadin for over a year.  no known family history  . UTI (urinary tract infection)   . High cholesterol     Past Surgical History  Procedure Date  . Abdominal hysterectomy   . Cholecystectomy     1980  . Esophagogastroduodenoscopy 08/19/2012    Procedure: ESOPHAGOGASTRODUODENOSCOPY (EGD);  Surgeon: Vertell Novak., MD;  Location: [facility] ENDOSCOPY;  Service: Endoscopy;  Laterality: N/A;   . Cesarean section   . Hand surgery     Family History  Problem Relation Age of Onset  . Adopted: Yes    History  Substance Use Topics  . Smoking status: Current Every Day Smoker -- 0.2 packs/day for 10 years    Types: Cigarettes  . Smokeless tobacco: Never Used  . Alcohol Use: No    OB History    Grav Para Term Preterm Abortions TAB SAB Ect Mult Living                  Review of Systems  Constitutional: Negative for fever, chills, diaphoresis, activity change and appetite change.       Generalized weakness as per HPi  HENT: Positive for ear pain, congestion and rhinorrhea. Negative for sore throat, neck pain and ear discharge.   Respiratory: Negative for cough, chest tightness, shortness of breath and wheezing.   Cardiovascular: Negative for chest pain, palpitations and leg swelling.  Gastrointestinal: Negative for nausea, vomiting, abdominal pain and diarrhea.  Genitourinary: Negative for dysuria, frequency, hematuria and flank pain.  Skin: Negative for rash.  Neurological: Negative for dizziness, tremors, seizures, syncope and headaches.  All other systems reviewed and are negative.    Allergies  Keflex; Latex; Sulfa antibiotics; and Sulfur  Home Medications   Current Outpatient Rx  Name  Route  Sig  Dispense  Refill  . AMITRIPTYLINE HCL 25 MG PO TABS   Oral   Take 0.5 tablets (12.5 mg total) by mouth at  bedtime.   7 tablet   2   . INSULIN ISOPHANE & REGULAR (70-30) 100 UNIT/ML [place] SUSP   Subcutaneous   Inject 10 Units into the skin 2 (two) times daily with a meal.   10 mL   12   . LISINOPRIL-HYDROCHLOROTHIAZIDE 20-25 MG PO TABS   Oral   Take 1 tablet by mouth daily.   30 tablet   2   . WARFARIN SODIUM 5 MG PO TABS   Oral   Take 2 tablets (10 mg total) by mouth one time only at 6 PM.   62 tablet   0     One month's supply   . ACETAMINOPHEN 325 MG PO TABS   Oral   Take 2 tablets (650 mg total) by mouth every 6 (six) hours as needed for  pain.   30 tablet   0   . AMLODIPINE BESYLATE 10 MG PO TABS   Oral   Take 1 tablet (10 mg total) by mouth daily.   30 tablet   2   . DOCUSATE SODIUM 100 MG PO CAPS   Oral   Take 1 capsule (100 mg total) by mouth every 12 (twelve) hours.   60 capsule   0   . ENOXAPARIN SODIUM 150 MG/ML [place] SOLN   Subcutaneous   Inject 0.7 mLs (105 mg total) into the skin every 12 (twelve) hours.   14 Syringe   0   . KETOROLAC TROMETHAMINE 10 MG PO TABS   Oral   Take 1 tablet (10 mg total) by mouth every 6 (six) hours.   20 tablet   0   . METHOCARBAMOL 500 MG PO TABS   Oral   Take 1 tablet (500 mg total) by mouth 3 (three) times daily.   45 tablet   0   . NICOTINE 14 MG/24HR TD PT24   Transdermal   Place 1 patch onto the skin daily.   28 patch   0   . OMEPRAZOLE 20 MG PO CPDR   Oral   Take 1 capsule (20 mg total) by mouth daily.   30 capsule   1   . POLYETHYLENE GLYCOL 3350 PO POWD   Oral   Take 17 g by mouth daily.   255 g   0   . DISPOSABLE ENEMA 19-7 GM/118ML RE ENEM      follow package directions.  Use one enema now,   Repeat in 12 hours   135 mL   2     BP 135/89  Pulse 112  Temp 98.3 F (36.8 C) (Oral)  Resp 16  SpO2 100%  Physical Exam  Nursing note and vitals reviewed. Constitutional: She is oriented to person, place, and time. She appears well-developed and well-nourished. No distress.       Appears comfortable  HENT:       Nasal Congestion with erythema and swelling of nasal turbinates, clear rhinorrhea. Mild pharyngeal erythema no exudates. No uvula deviation. No trismus. TM's impress clear fluid behind right TM. No redness swelling or bulging. Left TM normal  Eyes: Conjunctivae normal and EOM are normal. Pupils are equal, round, and reactive to light. Right eye exhibits no discharge. Left eye exhibits no discharge. No scleral icterus.  Neck: Neck supple. No JVD present. No thyromegaly present.  Cardiovascular: Normal rate, regular rhythm and normal  heart sounds.        Low extremity edema  Pulmonary/Chest: Effort normal and breath sounds normal. No respiratory distress. She  has no wheezes. She has no rales. She exhibits no tenderness.  Lymphadenopathy:    She has no cervical adenopathy.  Neurological: She is alert and oriented to person, place, and time.  Skin: No rash noted. She is not diaphoretic.    ED Course  Procedures (including critical care time)  Labs Reviewed  POCT I-STAT, CHEM 8 - Abnormal; Notable for the following:    Glucose, Bld 169 (*)     Hemoglobin 16.0 (*)     HCT 47.0 (*)     All other components within normal limits   No results found.   1. Upper respiratory infection    EKG: sinus rhythm with a ventricular rate 94 beats per minute. No acute ischemic changes. Impress EKG unchanged from prior.     MDM  Clear lungs on exam. No chest pain or respiratory distress. Mild upper respiratory findings with clear rhinorrhea. Patient is afebrile. Electrolytes are normal with blood sugar 169. Impress mild dehydration with dry lips with slight tachycardia (heart rate in the 90s). Encouraged hydration and supportive care. Asked to close followup with her primary care provider to monitor her symptoms. Red flags that should prompt her return to medical attention discussed with patient and provided in writing.      Sharin Grave, MD 11/25/12 2321

## 2012-11-25 NOTE — ED Notes (Signed)
Pt c/o falling frequently the last past 3 days... Hx of neuropathy... Pt will be cleaning and suddenly will fall down... Denies any symptoms prior to the falls... Denies: fevers, vomiting, nauseas, diarrhea, weakness, dizziness... She is alert w/no signs of distress.

## 2012-12-05 ENCOUNTER — Ambulatory Visit (INDEPENDENT_AMBULATORY_CARE_PROVIDER_SITE_OTHER): Payer: Medicare Other | Admitting: Pharmacist

## 2012-12-05 DIAGNOSIS — I2699 Other pulmonary embolism without acute cor pulmonale: Secondary | ICD-10-CM

## 2012-12-05 DIAGNOSIS — Z7901 Long term (current) use of anticoagulants: Secondary | ICD-10-CM

## 2012-12-05 LAB — POCT INR: INR: 3

## 2012-12-05 NOTE — Progress Notes (Signed)
Anti-Coagulation Progress Note  Joy Butler is a 61 y.o. female who is currently on an anti-coagulation regimen.    RECENT RESULTS: Recent results are below, the most recent result is correlated with a dose of 62.5 mg. per week: Lab Results  Component Value Date   INR 3.0 12/05/2012   INR 1.40 11/14/2012   INR 2.0 10/31/2012    ANTI-COAG DOSE:   Latest dosing instructions   Total Sun Mon Tue Wed Thu Fri Sat   57.5 7.5 mg 7.5 mg 10 mg 7.5 mg 7.5 mg 10 mg 7.5 mg    (5 mg1.5) (5 mg1.5) (5 mg2) (5 mg1.5) (5 mg1.5) (5 mg2) (5 mg1.5)         ANTICOAG SUMMARY: Anticoagulation Episode Summary              Current INR goal 2.0-3.0 Next INR check 12/26/2012   INR from last check 3.0 (12/05/2012)     Weekly max dose (mg)  Target end date Indefinite   Indications Personal history of PE (pulmonary embolism) (Resolved) [V12.55], 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 Duration of therapy should be indefinite but with periodic assessment and advice and consent of the patient insofar as continuing indefiite warfarin therapy as per guidelines published in supplement to Journal CHEST.            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 Duration of therapy should be indefinite but with periodic assessment and advice and  consent of the patient insofar as continuing indefiite warfarin therapy as per guidelines published in supplement to Journal CHEST.            PATIENT INSTRUCTIONS: Patient Instructions  Patient instructed to take medications as defined in the Anti-coagulation Track section of this encounter.  Patient instructed to take today's dose.  Patient verbalized understanding of these instructions.        FOLLOW-UP Return in 3 weeks (on 12/26/2012) for Follow up INR at 2:45PM.  Hulen Luster, III Pharm.D., CACP

## 2012-12-05 NOTE — Patient Instructions (Signed)
Patient instructed to take medications as defined in the Anti-coagulation Track section of this encounter.  Patient instructed to take today's dose.  Patient verbalized understanding of these instructions.    

## 2012-12-26 ENCOUNTER — Ambulatory Visit (INDEPENDENT_AMBULATORY_CARE_PROVIDER_SITE_OTHER): Payer: Medicare Other | Admitting: Pharmacist

## 2012-12-26 DIAGNOSIS — I2699 Other pulmonary embolism without acute cor pulmonale: Secondary | ICD-10-CM

## 2012-12-26 DIAGNOSIS — Z7901 Long term (current) use of anticoagulants: Secondary | ICD-10-CM

## 2012-12-26 LAB — POCT INR: INR: 1.2

## 2012-12-26 NOTE — Patient Instructions (Signed)
Patient instructed to take medications as defined in the Anti-coagulation Track section of this encounter.  Patient instructed to take today's dose.  Patient verbalized understanding of these instructions.    

## 2012-12-26 NOTE — Progress Notes (Signed)
Anti-Coagulation Progress Note  Joy Butler is a 62 y.o. female who is currently on an anti-coagulation regimen.    RECENT RESULTS: Recent results are below, the most recent result is correlated with a dose of 57.5 mg. per week: Lab Results  Component Value Date   INR 1.20 12/26/2012   INR 3.0 12/05/2012   INR 1.40 11/14/2012    ANTI-COAG DOSE:   Latest dosing instructions   Total Sun Mon Tue Wed Thu Fri Sat   62.5 7.5 mg 10 mg 10 mg 10 mg 7.5 mg 10 mg 7.5 mg    (5 mg1.5) (5 mg2) (5 mg2) (5 mg2) (5 mg1.5) (5 mg2) (5 mg1.5)         ANTICOAG SUMMARY: Anticoagulation Episode Summary              Current INR goal 2.0-3.0 Next INR check 01/02/2013   INR from last check 1.20! 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 Duration of therapy should be indefinite but with periodic assessment and advice and consent of the patient insofar as continuing indefiite warfarin therapy as per guidelines published in supplement to Journal CHEST.            ANTICOAG TODAY: Anticoagulation Summary as of 12/26/2012              INR goal 2.0-3.0     Selected INR 1.20! 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 Duration of therapy should be indefinite but with periodic assessment and advice and consent  of the patient insofar as continuing indefiite warfarin therapy as per guidelines published in supplement to Journal CHEST.            PATIENT INSTRUCTIONS: Patient Instructions  Patient instructed to take medications as defined in the Anti-coagulation Track section of this encounter.  Patient instructed to take today's dose.  Patient verbalized understanding of these instructions.        FOLLOW-UP Return in 7 days (on 01/02/2013) for Follow up INR at 4:15PM.  Hulen Luster, III Pharm.D., CACP

## 2013-01-02 ENCOUNTER — Ambulatory Visit: Payer: Medicare Other

## 2013-01-11 NOTE — Progress Notes (Signed)
Note reviewed.  Agree with plan by Dr. Groce for anti-coagulation.  I did not personally see the patient.   Signed  MULLEN, EMILY  

## 2013-01-11 NOTE — Progress Notes (Signed)
Note reviewed.  Agree with plan by Dr. Groce for anti-coagulation.  I did not personally see the patient.   Signed  Lashane Whelpley  

## 2013-01-16 ENCOUNTER — Ambulatory Visit (INDEPENDENT_AMBULATORY_CARE_PROVIDER_SITE_OTHER): Payer: BC Managed Care – HMO | Admitting: Pharmacist

## 2013-01-16 DIAGNOSIS — Z7901 Long term (current) use of anticoagulants: Secondary | ICD-10-CM

## 2013-01-16 DIAGNOSIS — I2699 Other pulmonary embolism without acute cor pulmonale: Secondary | ICD-10-CM

## 2013-01-16 NOTE — Progress Notes (Signed)
Anti-Coagulation Progress Note  ISSABELLA RIX is a 62 y.o. female who is currently on an anti-coagulation regimen.    RECENT RESULTS: Recent results are below, the most recent result is correlated with a dose of 62.5 mg. per week: Lab Results  Component Value Date   INR 1.30 01/16/2013   INR 1.20 12/26/2012   INR 3.0 12/05/2012    ANTI-COAG DOSE:   Latest dosing instructions   Total Sun Mon Tue Wed Thu Fri Sat   75 10 mg 12.5 mg 10 mg 10 mg 12.5 mg 10 mg 10 mg    (5 mg2) (5 mg2.5) (5 mg2) (5 mg2) (5 mg2.5) (5 mg2) (5 mg2)         ANTICOAG SUMMARY: Anticoagulation Episode Summary              Current INR goal 2.0-3.0 Next INR check 01/30/2013   INR from last check 1.30! 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 Duration of therapy should be indefinite but with periodic assessment and advice and consent of the patient insofar as continuing indefiite warfarin therapy as per guidelines published in supplement to Journal CHEST.            ANTICOAG TODAY: Anticoagulation Summary as of 01/16/2013              INR goal 2.0-3.0     Selected INR 1.30! 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 Duration of therapy should be indefinite but with periodic assessment and advice and consent of  the patient insofar as continuing indefiite warfarin therapy as per guidelines published in supplement to Journal CHEST.            PATIENT INSTRUCTIONS: Patient Instructions  Patient instructed to take medications as defined in the Anti-coagulation Track section of this encounter.  Patient instructed to take today's dose.  Patient verbalized understanding of these instructions.        FOLLOW-UP Return in 2 weeks (on 01/30/2013) for Follow up INR at 1045h.  Hulen Luster, III Pharm.D., CACP

## 2013-01-16 NOTE — Patient Instructions (Signed)
Patient instructed to take medications as defined in the Anti-coagulation Track section of this encounter.  Patient instructed to take today's dose.  Patient verbalized understanding of these instructions.    

## 2013-01-23 ENCOUNTER — Other Ambulatory Visit: Payer: Self-pay | Admitting: Internal Medicine

## 2013-01-23 DIAGNOSIS — I2699 Other pulmonary embolism without acute cor pulmonale: Secondary | ICD-10-CM

## 2013-01-23 MED ORDER — WARFARIN SODIUM 5 MG PO TABS: 5.0000 mg | ORAL_TABLET | ORAL | Status: DC

## 2013-01-28 ENCOUNTER — Encounter: Payer: Self-pay | Admitting: Internal Medicine

## 2013-01-29 ENCOUNTER — Encounter: Payer: Self-pay | Admitting: Internal Medicine

## 2013-01-29 DIAGNOSIS — K5900 Constipation, unspecified: Secondary | ICD-10-CM | POA: Insufficient documentation

## 2013-01-29 DIAGNOSIS — F17200 Nicotine dependence, unspecified, uncomplicated: Secondary | ICD-10-CM | POA: Insufficient documentation

## 2013-01-29 DIAGNOSIS — IMO0001 Reserved for inherently not codable concepts without codable children: Secondary | ICD-10-CM | POA: Insufficient documentation

## 2013-01-29 DIAGNOSIS — K644 Residual hemorrhoidal skin tags: Secondary | ICD-10-CM | POA: Insufficient documentation

## 2013-01-29 DIAGNOSIS — E7800 Pure hypercholesterolemia, unspecified: Secondary | ICD-10-CM | POA: Insufficient documentation

## 2013-01-30 ENCOUNTER — Encounter: Payer: Self-pay | Admitting: Licensed Clinical Social Worker

## 2013-01-30 ENCOUNTER — Ambulatory Visit (INDEPENDENT_AMBULATORY_CARE_PROVIDER_SITE_OTHER): Payer: Medicare Other | Admitting: Internal Medicine

## 2013-01-30 ENCOUNTER — Encounter: Payer: Self-pay | Admitting: Internal Medicine

## 2013-01-30 ENCOUNTER — Ambulatory Visit (INDEPENDENT_AMBULATORY_CARE_PROVIDER_SITE_OTHER): Payer: Medicare Other | Admitting: Pharmacist

## 2013-01-30 ENCOUNTER — Other Ambulatory Visit (HOSPITAL_COMMUNITY)
Admission: RE | Admit: 2013-01-30 | Discharge: 2013-01-30 | Disposition: A | Discharge status: Home / Self Care | Payer: Medicare Other | Source: Ambulatory Visit | Attending: Internal Medicine | Admitting: Internal Medicine

## 2013-01-30 VITALS — BP 130/70 | HR 105 | Temp 97.1°F | Ht 69.0 in | Wt 242.6 lb

## 2013-01-30 DIAGNOSIS — M549 Dorsalgia, unspecified: Secondary | ICD-10-CM

## 2013-01-30 DIAGNOSIS — Z79899 Other long term (current) drug therapy: Secondary | ICD-10-CM

## 2013-01-30 DIAGNOSIS — G8929 Other chronic pain: Secondary | ICD-10-CM

## 2013-01-30 DIAGNOSIS — I10 Essential (primary) hypertension: Secondary | ICD-10-CM

## 2013-01-30 DIAGNOSIS — E1142 Type 2 diabetes mellitus with diabetic polyneuropathy: Secondary | ICD-10-CM

## 2013-01-30 DIAGNOSIS — Z124 Encounter for screening for malignant neoplasm of cervix: Secondary | ICD-10-CM | POA: Insufficient documentation

## 2013-01-30 DIAGNOSIS — E119 Type 2 diabetes mellitus without complications: Secondary | ICD-10-CM

## 2013-01-30 DIAGNOSIS — L0390 Cellulitis, unspecified: Secondary | ICD-10-CM

## 2013-01-30 DIAGNOSIS — I2699 Other pulmonary embolism without acute cor pulmonale: Secondary | ICD-10-CM

## 2013-01-30 DIAGNOSIS — L0291 Cutaneous abscess, unspecified: Secondary | ICD-10-CM

## 2013-01-30 DIAGNOSIS — Z1151 Encounter for screening for human papillomavirus (HPV): Secondary | ICD-10-CM | POA: Insufficient documentation

## 2013-01-30 DIAGNOSIS — E1149 Type 2 diabetes mellitus with other diabetic neurological complication: Secondary | ICD-10-CM

## 2013-01-30 DIAGNOSIS — Z0000 Encounter for general adult medical examination without abnormal findings: Secondary | ICD-10-CM | POA: Insufficient documentation

## 2013-01-30 DIAGNOSIS — E7800 Pure hypercholesterolemia, unspecified: Secondary | ICD-10-CM

## 2013-01-30 DIAGNOSIS — Z7901 Long term (current) use of anticoagulants: Secondary | ICD-10-CM

## 2013-01-30 LAB — COMPLETE METABOLIC PANEL WITH GFR
ALT: 13 U/L (ref 0–35)
CO2: 26 mEq/L (ref 19–32)
Calcium: 8.8 mg/dL (ref 8.4–10.5)
Chloride: 107 mEq/L (ref 96–112)
GFR, Est African American: 76 mL/min
GFR, Est Non African American: 66 mL/min
Glucose, Bld: 208 mg/dL — ABNORMAL HIGH (ref 70–99)
Sodium: 141 mEq/L (ref 135–145)
Total Protein: 6.7 g/dL (ref 6.0–8.3)

## 2013-01-30 LAB — CBC
MCH: 27.1 pg (ref 26.0–34.0)
MCHC: 33.5 g/dL (ref 30.0–36.0)
Platelets: 443 10*3/uL — ABNORMAL HIGH (ref 150–400)
RDW: 16.2 % — ABNORMAL HIGH (ref 11.5–15.5)

## 2013-01-30 LAB — VITAMIN B12: Vitamin B-12: 499 pg/mL (ref 211–911)

## 2013-01-30 LAB — POCT GLYCOSYLATED HEMOGLOBIN (HGB A1C): Hemoglobin A1C: 9.6

## 2013-01-30 MED ORDER — PRAVASTATIN SODIUM 40 MG PO TABS: 40.0000 mg | ORAL_TABLET | Freq: Every evening | ORAL | Status: DC

## 2013-01-30 MED ORDER — OMEPRAZOLE 20 MG PO CPDR: 20.0000 mg | DELAYED_RELEASE_CAPSULE | Freq: Every day | ORAL | Status: DC

## 2013-01-30 MED ORDER — INSULIN NPH ISOPHANE & REGULAR (70-30) 100 UNIT/ML ~~LOC~~ SUSP: 12.5000 [IU] | Freq: Two times a day (BID) | SUBCUTANEOUS | Status: DC

## 2013-01-30 MED ORDER — KETOROLAC TROMETHAMINE 10 MG PO TABS: 10.0000 mg | ORAL_TABLET | Freq: Four times a day (QID) | ORAL | Status: DC | PRN

## 2013-01-30 MED ORDER — LISINOPRIL-HYDROCHLOROTHIAZIDE 20-25 MG PO TABS: 1.0000 | ORAL_TABLET | Freq: Every day | ORAL | Status: DC

## 2013-01-30 MED ORDER — AMITRIPTYLINE HCL 25 MG PO TABS: 12.5000 mg | ORAL_TABLET | Freq: Every day | ORAL | Status: DC

## 2013-01-30 MED ORDER — AMLODIPINE BESYLATE 10 MG PO TABS: 10.0000 mg | ORAL_TABLET | Freq: Every day | ORAL | Status: DC

## 2013-01-30 NOTE — Progress Notes (Signed)
Patient ID: Joy Butler, female   DOB: [birth year], 62 y.o.   MRN: [id number]   Subjective:   Patient ID: Joy Butler female   DOB: August 18, 1951 62 y.o.   MRN: [id number]  HPI: Joy Butler is a 62 y.o. with general body weakness, associated with chills and lightheadedness for the last 3 weeks. She had a 'cyst' in the inner aspect of her right thigh which a burst with release of pus 3 days ago. She denies any other skin rashes or swellings. It was very painful but now the pain has improved. She did not seek treatment until today. However, she still feels fatigued, with pains in her back, and legs. She had a history of a fall in December 2013, when her legs give way.  She did not have any injuries.    She reports that she has been taking her medications including Insulin, Warfarin and 'one white pill' for high blood pressure. She does not know the name of this medication.   Please see the A&P for the status of the pt's chronic medical problems.   Past Medical History  Diagnosis Date  . Diabetes mellitus     diagnosed in 2000.    Marland Kitchen Hypertension   . Pulmonary embolism     2011 treated at [facility] in [place].  Was on Coumadin for over a year.  no known family history  . UTI (urinary tract infection)   . High cholesterol   . 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 acetaminophen (TYLENOL) 325 MG tablet Take 2 tablets (650 mg total) by mouth every 6 (six) hours as needed for pain.  30 tablet  0  . amLODipine (NORVASC) 10 MG tablet Take 1 tablet (10 mg total) by mouth daily.  30 tablet  2  . insulin NPH-insulin regular (NOVOLIN 70/30) (70-30) 100 UNIT/ML injection Inject 12.5 Units into the skin 2 (two) times daily with a meal.  10 mL  12  . polyethylene glycol powder (MIRALAX) powder Take 17 g by mouth daily.  255 g  0  . warfarin (COUMADIN) 5 MG tablet Take 1 tablet (5 mg total) by mouth See admin  instructions.  62 tablet  0  . amitriptyline (ELAVIL) 25 MG tablet Take 0.5 tablets (12.5 mg total) by mouth at bedtime.  30 tablet  2  . docusate sodium (COLACE) 100 MG capsule Take 1 capsule (100 mg total) by mouth every 12 (twelve) hours.  60 capsule  0  . enoxaparin (LOVENOX) 150 MG/ML injection Inject 0.7 mLs (105 mg total) into the skin every 12 (twelve) hours.  14 Syringe  0  . ketorolac (TORADOL) 10 MG tablet Take 1 tablet (10 mg total) by mouth every 6 (six) hours as needed for pain.  30 tablet  0  . lisinopril-hydrochlorothiazide (PRINZIDE,ZESTORETIC) 20-25 MG per tablet Take 1 tablet by mouth daily.  30 tablet  2  . methocarbamol (ROBAXIN) 500 MG tablet Take 1 tablet (500 mg total) by mouth 3 (three) times daily.  45 tablet  0  . nicotine (NICODERM CQ - DOSED IN MG/24 HOURS) 14 mg/24hr patch Place 1 patch onto the skin daily.  28 patch  0  . omeprazole (PRILOSEC) 20 MG capsule Take 1 capsule (20 mg total) by mouth daily.  30 capsule  1  . pravastatin (PRAVACHOL) 40 MG tablet Take 1 tablet (40 mg total) by mouth every  evening.  30 tablet  11  . sodium phosphate (FLEET) enema follow package directions.  Use one enema now,   Repeat in 12 hours  135 mL  2   No current facility-administered medications for this visit.   Family History  Problem Relation Age of Onset  . Adopted: Yes   History   Social History  . Marital Status: Single    Spouse Name: N/A    Number of Children: N/A  . Years of Education: N/A   Social History Main Topics  . Smoking status: Current Every Day Smoker -- 0.20 packs/day for 10 years    Types: Cigarettes  . Smokeless tobacco: Never Used  . Alcohol Use: No  . Drug Use: No  . Sexually Active: Not Currently   Other Topics Concern  . None   Social History Narrative   Previously divorced now engaged with her partner of 4 years.  Has 6 grown children with 21 grandchildren.  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  Moved to [place] in August 2013 and was transiently homeless until first part of October 2013.     Review of Systems: Constitutional: Reports chills but denies fever, diaphoresis, appetite change and fatigue.  HEENT: Reports visual disturbances in both eyes but no photophobia, eye pain, redness, hearing loss, ear pain, congestion, sore throat, rhinorrhea, sneezing, mouth sores, trouble swallowing, neck pain, neck stiffness and tinnitus.   Respiratory: Denies SOB, DOE, cough, chest tightness,  and wheezing.   Cardiovascular: Denies chest pain, palpitations and leg swelling.  Gastrointestinal: Denies nausea, vomiting, abdominal pain, diarrhea, constipation, blood in stool and abdominal distention. She reports that her constipatin Genitourinary: Denies dysuria, urgency, frequency, hematuria, flank pain and difficulty urinating.  Musculoskeletal: Denies myalgias, back pain, joint swelling, arthralgias and gait problem.  Neurological: Denies dizziness, seizures, syncope, weakness, light-headedness, numbness and headaches.  Hematological: Denies adenopathy. Easy bruising, personal or family bleeding history  Psychiatric/Behavioral: Denies suicidal ideation, mood changes, confusion, nervousness, sleep disturbance and agitation  Objective:  Physical Exam: Filed Vitals:   01/30/13 1033 01/30/13 1043  BP: 155/95 130/70  Pulse: 105   Temp: 97.1 F (36.2 C)   TempSrc: Oral   Height: 5\' 9"  (1.753 m)   Weight: 242 lb 9.6 oz (110.043 kg)   SpO2: 98%    Constitutional: Vital signs reviewed.  Patient is a well-developed and well-nourished in no acute distress and cooperative with exam. Alert and oriented x3. However, she looks fatigued. Head: Normocephalic and atraumatic Ear: TM normal bilaterally Mouth: no erythema or exudates, MMM Eyes: PERRL, EOMI, conjunctivae normal, No scleral icterus.  Neck: Supple, Trachea midline normal ROM, No JVD, mass, thyromegaly, or carotid bruit present.   Cardiovascular: RRR, S1 normal, S2 normal, no MRG, pulses symmetric and intact bilaterally Pulmonary/Chest: CTAB, no wheezes, rales, or rhonchi Abdominal: Soft. Non-tender, non-distended, bowel sounds are normal, no masses, organomegaly, or guarding present.  GU: no CVA tenderness Musculoskeletal: No joint deformities, erythema, or stiffness, ROM full and no nontender Hematology: no cervical, inginal, or axillary adenopathy.  Neurological: A&O x3, Strength is normal and symmetric bilaterally, cranial nerve II-XII are grossly intact, no focal motor deficit, sensory intact to light touch bilaterally.  Skin: small firm swelling in medial side of her right thigh, about 3 cm in diameter, non-tender, small open wound on top of the swelling but no pus or blood coming out. No erythema, fluctuance or increased warmth. No inguinal lymph nodes.  Psychiatric: Normal mood  and affect. speech and behavior is normal. Judgment and thought content normal. Cognition and memory are normal.   Assessment & Plan:  I have discussed my plan and assessment for the care of this patient with Dr. Kem Kays as detailed under the problem based charting.

## 2013-01-30 NOTE — Assessment & Plan Note (Addendum)
Plan  Had a pap smear performed today  We will order records about her Colonoscopy in 2012 at [facility] Referral for Mammogram She declined a Flu and Pneumococcal vaccine

## 2013-01-30 NOTE — Patient Instructions (Signed)
Patient instructed to take medications as defined in the Anti-coagulation Track section of this encounter.  Patient instructed to take today's dose.  Patient verbalized understanding of these instructions.    

## 2013-01-30 NOTE — Assessment & Plan Note (Signed)
This is a chronic problem for her. She reports relief with Amitriptyline.   Plan  - Refilled her Amitriptyline

## 2013-01-30 NOTE — Assessment & Plan Note (Signed)
She does not have fevers and the physical exam reveals a healing previous abcess Plan  - ordered a routine CBC - No antibiotic at this time unless there is a leukocytosis on CBC.

## 2013-01-30 NOTE — Assessment & Plan Note (Addendum)
She reports compliance with the insulin. However, her HbA1c is 9.6, which is worse compared to 3 months ago, when it was 8.8. She has some issues with cost for her medication but she reports that she has a good supply of Insulin to mid-March.  Plan. Lupita Leash to assist supply a free meter to her. -Increase her insulin to 12.5 units of NPH twice daily. -We'll encourage her to measure her blood sugars 3 times daily and bring her meter for interrogation microalbuminuria/creatinine ratio -Will see her in 3 weeks and at that time with an increase her insulin

## 2013-01-30 NOTE — Assessment & Plan Note (Addendum)
She has no been on Statin. Her 10 years risk for CVD is 44%.  Plan  - Started Pravastatin which is on $4 list.  -CMP today for baseline Liver panel -We will check her Lipid panel after three month

## 2013-01-30 NOTE — Progress Notes (Signed)
Anti-Coagulation Progress Note  Joy Butler is a 62 y.o. female who is currently on an anti-coagulation regimen.    RECENT RESULTS: Recent results are below, the most recent result is correlated with a dose of 75 mg. per week: Lab Results  Component Value Date   INR 1.50 01/30/2013   INR 1.30 01/16/2013   INR 1.20 12/26/2012    ANTI-COAG DOSE: Anticoagulation Dose Instructions as of 01/30/2013     Joy Butler Tue Wed Thu Fri Sat   New Dose 12.5 mg 12.5 mg 12.5 mg 12.5 mg 12.5 mg 12.5 mg 12.5 mg       ANTICOAG SUMMARY: Anticoagulation Episode Summary   Current INR goal 2.0-3.0  Next INR check 02/06/2013  INR from last check 1.50! 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 Duration of therapy should be indefinite but with periodic assessment and advice and consent of the patient insofar as continuing indefiite warfarin therapy as per guidelines published in supplement to Journal CHEST.        ANTICOAG TODAY: Anticoagulation Summary as of 01/30/2013   INR goal 2.0-3.0  Selected INR 1.50! 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 Duration of therapy should be indefinite but with periodic assessment and advice and consent of the patient insofar as continuing indefiite warfarin therapy as per guidelines published in  supplement to Journal CHEST.      PATIENT INSTRUCTIONS: Patient Instructions  Patient instructed to take medications as defined in the Anti-coagulation Track section of this encounter.  Patient instructed to take today's dose.  Patient verbalized understanding of these instructions.       FOLLOW-UP Return in 7 days (on 02/06/2013) for Follow up INR at 1000h.  Hulen Luster, III Pharm.D., CACP

## 2013-01-30 NOTE — Progress Notes (Signed)
Agree 

## 2013-01-30 NOTE — Assessment & Plan Note (Signed)
She is still complaint of back pain. Toradol helps reduce the pain from a 7 to 2/10.  Plan  - Prescribed some toradol

## 2013-01-30 NOTE — Patient Instructions (Addendum)
  General Instructions: Please follow up with Dr Alexandria Lodge for INR  I have increased your Insulin to 12.5 Units twice daily  Please continue with your medications including Amlodipine Please take Lisinopril/Hydrochlothiazide You will need to see our social Worker Edson Snowball I have sent a prescription for a glucose meter  Please come back to clinic in three weeks. I will call you with results from your blood check    Treatment Goals:  Goals (1 Years of Data) as of 01/30/13         As of Today As of Today 11/25/12 11/25/12 11/25/12     Blood Pressure    . Blood Pressure < 120/80  130/70 155/95 135/89 133/89 156/90     Result Component    . HEMOGLOBIN A1C < 7.0  9.6        . LDL CALC < 70            Progress Toward Treatment Goals:  Treatment Goal 01/30/2013  Hemoglobin A1C deteriorated  Blood pressure at goal  Stop smoking smoking the same amount    Self Care Goals & Plans:       Care Management & Community Referrals:  Referral 01/30/2013  Referrals made for care management support diabetes educator

## 2013-01-30 NOTE — Assessment & Plan Note (Addendum)
BP Readings from Last 3 Encounters:  01/30/13 130/70  11/25/12 135/89  10/21/12 122/80    Lab Results  Component Value Date   NA 141 11/25/2012   K 4.0 11/25/2012   CREATININE 0.90 11/25/2012    Assessment:  Blood pressure control: controlled  Progress toward BP goal:  at goal  Comments: she can not afford some of her medications  Plan:  Medications:  Restarted her Lisinopril and HCTZ 10/12.5 for renal protection   Educational resources provided:    Self management tools provided:    Other plans: Done BMET today.

## 2013-01-31 ENCOUNTER — Telehealth: Payer: Self-pay | Admitting: Dietician

## 2013-01-31 LAB — MICROALBUMIN / CREATININE URINE RATIO

## 2013-01-31 NOTE — Telephone Encounter (Signed)
Called patient about obtaining glucometer. She scheduled appointment for DSMT/MNT. Will try to coordinate care with social work for same day appointment

## 2013-01-31 NOTE — Progress Notes (Signed)
Joy Butler was referred to CSW due to current financial hardship.  Joy Butler was given 4 new prescriptions today and can not afford her medications until Feb. 19th when her check becomes available.  Two of patients medications were on the $4 listing, insulin will be $25 at [facility] and CSW provided Joy Butler with goodRx coupon for Toradol.  CSW will send Joy Butler information on the [employer] for Joy Butler to determine if she qualifies.  CSW also discussed [facility], as possible resource for prescription assistance programs.  Joy Butler in agreement for both.

## 2013-02-07 ENCOUNTER — Ambulatory Visit (HOSPITAL_COMMUNITY)
Admission: RE | Admit: 2013-02-07 | Discharge: 2013-02-07 | Disposition: A | Discharge status: Home / Self Care | Payer: Medicare Other | Source: Ambulatory Visit | Attending: Internal Medicine | Admitting: Internal Medicine

## 2013-02-07 ENCOUNTER — Telehealth: Payer: Self-pay | Admitting: Internal Medicine

## 2013-02-07 DIAGNOSIS — Z0000 Encounter for general adult medical examination without abnormal findings: Secondary | ICD-10-CM

## 2013-02-07 DIAGNOSIS — Z1231 Encounter for screening mammogram for malignant neoplasm of breast: Secondary | ICD-10-CM | POA: Insufficient documentation

## 2013-02-07 NOTE — Telephone Encounter (Signed)
I called Joy Butler and discussed the results of her pap smear. I informed her that we need to repeat it in 6-12 months and if high risk HPV is detected, we will consider referral for colposcopy. I answered her questions. She informed me that she will be having a mammogram today.

## 2013-02-09 ENCOUNTER — Ambulatory Visit: Payer: Medicare Other | Admitting: Dietician

## 2013-02-13 ENCOUNTER — Other Ambulatory Visit: Payer: Self-pay | Admitting: *Deleted

## 2013-02-13 MED ORDER — ACCU-CHEK AVIVA PLUS W/DEVICE KIT: 1.0000 | PACK | Freq: Three times a day (TID) | Status: DC

## 2013-02-13 MED ORDER — GLUCOSE BLOOD VI STRP: ORAL_STRIP | Status: DC

## 2013-02-13 NOTE — Telephone Encounter (Signed)
Thn, juana wallace calls stating she made first visit to pt in the home today, states pt does not have all her meds and is taking the wrong insulin- she is taking lantus twice daily instead of the novolin 70/30. She is not taking the pravastatin nor the lisinopril/hctz. She does not have these in the home and i will call them to [facility] then call [facility], thn and they will pay for the meds, pt states she cannot afford her meds. Her vital signs today were 158/98, hr 99 and cbg 256. Pt does not have a glucometer either. Pt states she has missed her appts due to lack of transportation She states all she had to eat today was a few pcs of bacon, i ask if pt had adequate food in the home and am told she did not address that today but she assumed pt just couldn't afford the most nutritious foods. Pt needs transportation services, medication services, food assistance, she also stated she could not read the forms she needed to sign due to a vision problem but that she has an appt w/ the eye dr in march... i don't know if it's a vision problem or a learning deficit, i am very concerned about this lady's situation

## 2013-02-14 NOTE — Telephone Encounter (Signed)
Pharmacy recieved

## 2013-02-14 NOTE — Telephone Encounter (Signed)
I will forward this note to the [facility] social worker, J. Saporito.

## 2013-02-15 ENCOUNTER — Telehealth: Payer: Self-pay | Admitting: *Deleted

## 2013-02-17 ENCOUNTER — Ambulatory Visit (INDEPENDENT_AMBULATORY_CARE_PROVIDER_SITE_OTHER): Payer: Medicare Other | Admitting: Internal Medicine

## 2013-02-17 ENCOUNTER — Encounter: Payer: Self-pay | Admitting: Internal Medicine

## 2013-02-17 ENCOUNTER — Ambulatory Visit (INDEPENDENT_AMBULATORY_CARE_PROVIDER_SITE_OTHER): Payer: Medicare Other | Admitting: Pharmacist

## 2013-02-17 VITALS — BP 135/82 | HR 88 | Temp 97.8°F | Ht 69.0 in | Wt 242.3 lb

## 2013-02-17 DIAGNOSIS — I10 Essential (primary) hypertension: Secondary | ICD-10-CM | POA: Diagnosis not present

## 2013-02-17 DIAGNOSIS — M549 Dorsalgia, unspecified: Secondary | ICD-10-CM | POA: Diagnosis not present

## 2013-02-17 DIAGNOSIS — I2699 Other pulmonary embolism without acute cor pulmonale: Secondary | ICD-10-CM | POA: Diagnosis not present

## 2013-02-17 DIAGNOSIS — Z23 Encounter for immunization: Secondary | ICD-10-CM | POA: Diagnosis not present

## 2013-02-17 DIAGNOSIS — E7800 Pure hypercholesterolemia, unspecified: Secondary | ICD-10-CM | POA: Diagnosis not present

## 2013-02-17 DIAGNOSIS — E1142 Type 2 diabetes mellitus with diabetic polyneuropathy: Secondary | ICD-10-CM | POA: Diagnosis not present

## 2013-02-17 DIAGNOSIS — L0291 Cutaneous abscess, unspecified: Secondary | ICD-10-CM | POA: Diagnosis not present

## 2013-02-17 DIAGNOSIS — G8929 Other chronic pain: Secondary | ICD-10-CM

## 2013-02-17 DIAGNOSIS — Z7901 Long term (current) use of anticoagulants: Secondary | ICD-10-CM | POA: Diagnosis not present

## 2013-02-17 DIAGNOSIS — J069 Acute upper respiratory infection, unspecified: Secondary | ICD-10-CM | POA: Diagnosis not present

## 2013-02-17 DIAGNOSIS — E1149 Type 2 diabetes mellitus with other diabetic neurological complication: Secondary | ICD-10-CM | POA: Diagnosis not present

## 2013-02-17 MED ORDER — AMITRIPTYLINE HCL 25 MG PO TABS: 50.0000 mg | ORAL_TABLET | Freq: Every day | ORAL | Status: DC

## 2013-02-17 MED ORDER — WARFARIN SODIUM 5 MG PO TABS: ORAL_TABLET | ORAL | Status: DC

## 2013-02-17 MED ORDER — AMITRIPTYLINE HCL 25 MG PO TABS: 25.0000 mg | ORAL_TABLET | Freq: Every day | ORAL | Status: DC

## 2013-02-17 MED ORDER — TRAMADOL-ACETAMINOPHEN 37.5-325 MG PO TABS: 1.0000 | ORAL_TABLET | ORAL | Status: DC | PRN

## 2013-02-17 NOTE — Progress Notes (Signed)
Anti-Coagulation Progress Note  Joy Butler is a 62 y.o. female who is currently on an anti-coagulation regimen.    RECENT RESULTS: Recent results are below, the most recent result is correlated with a dose of 87.5 mg. per week:  Will increase to 100mg /wk. Which is within our 10 - 20% per protocol increase range for this INR response. Was advised to keep appointments and be adherent to her dosing regimen. Lab Results  Component Value Date   INR 1.1 02/17/2013   INR 1.50 01/30/2013   INR 1.30 01/16/2013    ANTI-COAG DOSE: Anticoagulation Dose Instructions as of 02/17/2013     [name] Tue Wed Thu Fri Sat   New Dose 15 mg 12.5 mg 15 mg 15 mg 12.5 mg 15 mg 15 mg       ANTICOAG SUMMARY: Anticoagulation Episode Summary   Current INR goal 2.0-3.0  Next INR check 02/27/2013  INR from last check 1.1! (02/17/2013)  Weekly max dose   Target end date Indefinite  INR check location   Preferred lab   Send INR reminders to    Indications  Personal history of PE (pulmonary embolism) (Resolved) [V12.55] 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 Duration of therapy should be indefinite but with periodic assessment and advice and consent of the patient insofar as continuing indefiite warfarin therapy as per guidelines published in supplement to Journal CHEST.        ANTICOAG TODAY: Anticoagulation Summary as of 02/17/2013   INR goal 2.0-3.0  Selected INR 1.1! (02/17/2013)  Next INR check 02/27/2013  Target end date Indefinite   Indications  Personal history of PE (pulmonary embolism) (Resolved) [V12.55] Recurrent pulmonary embolism [415.19] Long term (current) use of anticoagulants [V58.61]      Anticoagulation Episode Summary   INR check location    Preferred lab    Send INR reminders to    Comments Documented problem list reveals recurrence of PE. Duration of therapy  should be indefinite but with periodic assessment and advice and consent of the patient insofar as continuing indefiite warfarin therapy as per guidelines published in supplement to Journal CHEST.      PATIENT INSTRUCTIONS: Patient Instructions  Patient instructed to take medications as defined in the Anti-coagulation Track section of this encounter.  Patient instructed to take today's dose.  Patient verbalized understanding of these instructions.       FOLLOW-UP Return in 10 days (on 02/27/2013) for Follow up INR at 1015h.  Hulen Luster, III Pharm.D., CACP

## 2013-02-17 NOTE — Assessment & Plan Note (Signed)
She reports episode of leg weakness with one episode of her falling this past week. She uses a cane to ambulate and her physical exam does not reveal reduced strength. Etiology is likely diabetic neuropathy and possibly radiculopathy as seen on her MRI in 09/2012.   Plan  - I don't feel repeating MRI of her back is likely to be beneficial at this time  - Increase her dose of Amitriptyline to 50mg  QHS - Referral to outpatient physical therapy.

## 2013-02-17 NOTE — Progress Notes (Signed)
Patient ID: Joy Butler, female   DOB: 09/29/1951, 62 y.o.   MRN: [id number] Patient ID: RAMLA Butler, female   DOB: 09-08-1951, 62 y.o.   MRN: [id number]   Subjective:   Patient ID: Joy Butler female   DOB: 1951/01/19 62 y.o.   MRN: [id number]  HPI: [name] is a 62 y.o. with medical history of uncontrolled, type 2 diabetes, hypertension, recurrent pulmonary embolism, and hyperlipidemia, chronic back pain, presents for a routine visit. She does not have specific complaints today. However, she requests to have her pain medications refilled.  Please see the A&P for the status of the pt's chronic medical problems.   Past Medical History  Diagnosis Date  . Diabetes mellitus     diagnosed in 2000.    Marland Kitchen Hypertension   . Pulmonary embolism     2011 treated at [facility] in [place].  Was on Coumadin for over a year.  no known family history  . UTI (urinary tract infection)   . High cholesterol   . 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 amitriptyline (ELAVIL) 25 MG tablet Take 1 tablet (25 mg total) by mouth at bedtime.  30 tablet  2  . amLODipine (NORVASC) 10 MG tablet Take 1 tablet (10 mg total) by mouth daily.  30 tablet  2  . Blood Glucose Monitoring Suppl (ACCU-CHEK AVIVA PLUS) W/DEVICE KIT 1 kit by Does not apply route 3 (three) times daily. Please check blood sugar 3 to 4 times daily. diag code 250.0. Insulin dependent  1 kit  0  . docusate sodium (COLACE) 100 MG capsule Take 1 capsule (100 mg total) by mouth every 12 (twelve) hours.  60 capsule  0  . glucose blood (ACCU-CHEK AVIVA) test strip Please check blood sugar 3 to 4 times daily. diag code 250.0. Insulin dependent  100 each  12  . insulin NPH-insulin regular (NOVOLIN 70/30) (70-30) 100 UNIT/ML injection Inject 12.5 Units into the skin 2 (two) times daily with a meal.  10 mL  12  . lisinopril-hydrochlorothiazide (PRINZIDE,ZESTORETIC)  20-25 MG per tablet Take 1 tablet by mouth daily.  30 tablet  2  . methocarbamol (ROBAXIN) 500 MG tablet Take 1 tablet (500 mg total) by mouth 3 (three) times daily.  45 tablet  0  . nicotine (NICODERM CQ - DOSED IN MG/24 HOURS) 14 mg/24hr patch Place 1 patch onto the skin daily.  28 patch  0  . omeprazole (PRILOSEC) 20 MG capsule Take 1 capsule (20 mg total) by mouth daily.  30 capsule  1  . polyethylene glycol powder (MIRALAX) powder Take 17 g by mouth daily.  255 g  0  . pravastatin (PRAVACHOL) 40 MG tablet Take 1 tablet (40 mg total) by mouth every evening.  30 tablet  11  . sodium phosphate (FLEET) enema follow package directions.  Use one enema now,   Repeat in 12 hours  135 mL  2  . traMADol-acetaminophen (ULTRACET) 37.5-325 MG per tablet Take 1 tablet by mouth every 4 (four) hours as needed for pain.  30 tablet  0  . warfarin (COUMADIN) 5 MG tablet Take as directed by anticoagulation clinic provider. Taking 3 tablets daily all days except on Monday/Thursdays--then she takes 2 and 1/2 tablets.  80 tablet  2   No current facility-administered medications for this visit.   Family History  Problem Relation Age of Onset  .  Adopted: Yes   History   Social History  . Marital Status: Single    Spouse Name: N/A    Number of Children: N/A  . Years of Education: N/A   Social History Main Topics  . Smoking status: Current Every Day Smoker -- 0.20 packs/day for 10 years    Types: Cigarettes  . Smokeless tobacco: Never Used  . Alcohol Use: No  . Drug Use: No  . Sexually Active: Not Currently   Other Topics Concern  . None   Social History Narrative   Previously divorced now engaged with her partner of 4 years.  Has 6 grown children with 21 grandchildren.  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  Moved to [place] in August 2013 and was transiently homeless until first part of October 2013.     Review of Systems: Constitutional: Reports chills  but denies fever, diaphoresis, appetite change and fatigue.  HEENT: Reports visual disturbances in both eyes but no photophobia, eye pain, redness, hearing loss, ear pain, congestion, sore throat, rhinorrhea, sneezing, mouth sores, trouble swallowing, neck pain, neck stiffness and tinnitus.   Respiratory: Denies SOB, DOE, cough, chest tightness,  and wheezing.   Cardiovascular: Denies chest pain, palpitations and leg swelling.  Gastrointestinal: Denies nausea, vomiting, abdominal pain, diarrhea, constipation, blood in stool and abdominal distention. She reports that her constipatin Genitourinary: Denies dysuria, urgency, frequency, hematuria, flank pain and difficulty urinating.  Musculoskeletal: Denies myalgias, back pain, joint swelling, arthralgias and gait problem.  Neurological: Denies dizziness, seizures, syncope, weakness, light-headedness, numbness and headaches.  Hematological: Denies adenopathy. Easy bruising, personal or family bleeding history  Psychiatric/Behavioral: Denies suicidal ideation, mood changes, confusion, nervousness, sleep disturbance and agitation  Objective:  Physical Exam: Filed Vitals:   02/17/13 1030  BP: 135/82  Pulse: 88  Temp: 97.8 F (36.6 C)  TempSrc: Oral  Height: 5\' 9"  (1.753 m)  Weight: 242 lb 4.8 oz (109.907 kg)  SpO2: 96%   Constitutional: Vital signs reviewed.  Patient is a well-developed and well-nourished in no acute distress and cooperative with exam. Alert and oriented x3. However, she looks fatigued. Head: Normocephalic and atraumatic Ear: TM normal bilaterally Mouth: no erythema or exudates, MMM Eyes: PERRL, EOMI, conjunctivae normal, No scleral icterus.  Neck: Supple, Trachea midline normal ROM, No JVD, mass, thyromegaly, or carotid bruit present.  Cardiovascular: RRR, S1 normal, S2 normal, no MRG, pulses symmetric and intact bilaterally Pulmonary/Chest: CTAB, no wheezes, rales, or rhonchi Abdominal: Soft. Non-tender, non-distended,  bowel sounds are normal, no masses, organomegaly, or guarding present.  GU: no CVA tenderness Musculoskeletal: No joint deformities, erythema, or stiffness, ROM full and no nontender Hematology: no cervical, inginal, or axillary adenopathy.  Neurological: A&O x3, Strength is normal and symmetric bilaterally, cranial nerve II-XII are grossly intact, no focal motor deficit, sensory intact to light touch bilaterally.  Skin: Normal exam.  Psychiatric: Normal mood and affect. speech and behavior is normal. Judgment and thought content normal. Cognition and memory are normal.   Assessment & Plan:  I have discussed my plan and assessment for the care of this patient with Dr. Kem Kays as detailed under the problem based charting.  In terms of her back pain, I have prescribed Ultracet and referral to Physical therapy. She is already under [facility] regarding her social challenges and medication noncompliance. I will discuss with [facility] on Monday.

## 2013-02-17 NOTE — Assessment & Plan Note (Addendum)
She reports no relief with Toradol. She requests for another medication for pain. Am very hesitant to start this patient on any opioids.  Plan. -Started her on Ultracet. -Referral to outpatient physical therapy for low back pain -Will consider repeating her MRI, if needed.

## 2013-02-17 NOTE — Assessment & Plan Note (Signed)
BP Readings from Last 3 Encounters:  02/17/13 135/82  01/30/13 130/70  11/25/12 135/89    Lab Results  Component Value Date   NA 141 01/30/2013   K 3.8 01/30/2013   CREATININE 0.93 01/30/2013    Assessment:  Blood pressure control: controlled  Progress toward BP goal:  at goal  Comments:   Plan:  Medications:  continue current medications  Educational resources provided:    Self management tools provided:    Other plans:

## 2013-02-17 NOTE — Patient Instructions (Signed)
Patient instructed to take medications as defined in the Anti-coagulation Track section of this encounter.  Patient instructed to take today's dose.  Patient verbalized understanding of these instructions.    

## 2013-02-17 NOTE — Assessment & Plan Note (Signed)
Lab Results  Component Value Date   HGBA1C 9.6 01/30/2013   HGBA1C 8.8 10/21/2012   HGBA1C 10.4* 08/31/2012     Assessment:  Diabetes control: poor control (HgbA1C >9%)  Progress toward A1C goal:  deteriorated  Comments: Main problem is medication noncompliance and lack of insight  Plan:  Medications:  continue current medications  Home glucose monitoring:   Frequency:     Timing:    Instruction/counseling given: reminded to get eye exam, reminded to bring blood glucose meter & log to each visit and reminded to bring medications to each visit  Educational resources provided: brochure  Self management tools provided: copy of home glucose meter download;home glucose meter  Other plans: She now has a glucose meter and she brings it in. She will require a lot of effort from [facility] for medication compliance. She now has transportation to her appointment. I will consider increasing her insulin after getting in touch with her case manager from [facility] on Monday.

## 2013-02-17 NOTE — Patient Instructions (Addendum)
General Instructions: Please stop smoking  You will be referred to physical therapy today  Please increase you Amitriptyline to 25 mg at bed time Please take you insulin as prescribed  Treatment Goals:  Goals (1 Years of Data) as of 02/17/13         As of Today 01/30/13 01/30/13 11/25/12 11/25/12     Blood Pressure    . Blood Pressure < 120/80  135/82 130/70 155/95 135/89 133/89     Result Component    . HEMOGLOBIN A1C < 7.0   9.6       . LDL CALC < 70            Progress Toward Treatment Goals:  Treatment Goal 02/17/2013  Hemoglobin A1C deteriorated  Blood pressure at goal  Stop smoking smoking the same amount  Other  unchanged    Self Care Goals & Plans:       Care Management & Community Referrals:  Referral 02/17/2013  Referrals made for care management support [facility] case management program

## 2013-02-23 NOTE — Telephone Encounter (Signed)
Transportation arranged

## 2013-02-28 ENCOUNTER — Telehealth: Payer: Self-pay | Admitting: Internal Medicine

## 2013-02-28 DIAGNOSIS — H04129 Dry eye syndrome of unspecified lacrimal gland: Secondary | ICD-10-CM | POA: Diagnosis not present

## 2013-02-28 NOTE — Telephone Encounter (Signed)
I received notice from the patient's pharmacy that they had received a prescription for coumadin with confusing instructions. Upon review, the prescription had my printed name but someone else's signature. I recall seeing this patient with Dr. Zada Girt on the 28th of February, 2014 and discussed the plan of care with him.  The patient was also seen by Dr. Alexandria Lodge for an INR check on that day.  I called the patient to verify she knew what doses of coumadin to take on particular days and she was very well informed of the appropriate doses. She denies any bleeding and states she is feeling well. No complaints. She is scheduled to come back to clinic this week to re-check an INR. The Rx prescription name mismatch will need to be investigated.

## 2013-03-01 ENCOUNTER — Telehealth: Payer: Self-pay | Admitting: Pharmacist

## 2013-03-01 ENCOUNTER — Ambulatory Visit (INDEPENDENT_AMBULATORY_CARE_PROVIDER_SITE_OTHER): Payer: Medicare Other | Admitting: Internal Medicine

## 2013-03-01 ENCOUNTER — Encounter: Payer: Self-pay | Admitting: Licensed Clinical Social Worker

## 2013-03-01 ENCOUNTER — Encounter: Payer: Self-pay | Admitting: Internal Medicine

## 2013-03-01 VITALS — BP 102/66 | HR 108 | Temp 98.6°F | Ht 69.0 in | Wt 237.1 lb

## 2013-03-01 DIAGNOSIS — M549 Dorsalgia, unspecified: Secondary | ICD-10-CM

## 2013-03-01 DIAGNOSIS — F329 Major depressive disorder, single episode, unspecified: Secondary | ICD-10-CM

## 2013-03-01 DIAGNOSIS — E119 Type 2 diabetes mellitus without complications: Secondary | ICD-10-CM

## 2013-03-01 DIAGNOSIS — G8929 Other chronic pain: Secondary | ICD-10-CM

## 2013-03-01 DIAGNOSIS — I10 Essential (primary) hypertension: Secondary | ICD-10-CM

## 2013-03-01 MED ORDER — NICOTINE 14 MG/24HR TD PT24: 1.0000 | MEDICATED_PATCH | Freq: Every day | TRANSDERMAL | Status: DC

## 2013-03-01 MED ORDER — WARFARIN SODIUM 5 MG PO TABS: ORAL_TABLET | ORAL | Status: DC

## 2013-03-01 MED ORDER — INSULIN NPH ISOPHANE & REGULAR (70-30) 100 UNIT/ML ~~LOC~~ SUSP: 15.0000 [IU] | Freq: Two times a day (BID) | SUBCUTANEOUS | Status: DC

## 2013-03-01 MED ORDER — BLOOD PRESSURE KIT: 1.0000 [IU] | PACK | Freq: Every day | Status: DC

## 2013-03-01 MED ORDER — IBUPROFEN 200 MG PO TABS: 400.0000 mg | ORAL_TABLET | Freq: Three times a day (TID) | ORAL | Status: DC | PRN

## 2013-03-01 MED ORDER — CITALOPRAM HYDROBROMIDE 20 MG PO TABS: 20.0000 mg | ORAL_TABLET | Freq: Every day | ORAL | Status: DC

## 2013-03-01 NOTE — Assessment & Plan Note (Signed)
Patient has symptoms of depression. She was tearful in examination room. However, she denied history of suicide ideations. She has never had a mental health evaluation.  Plan. -Will initiate Celexa at a low dose of 20 mg once daily. -Follow up in 2 weeks and consider increasing Celexa to 40 mg once daily.

## 2013-03-01 NOTE — Progress Notes (Signed)
Patient ID: Joy Butler, female   DOB: [birth year], 62 y.o.   MRN: [id number]  Subjective:   Patient ID: Joy Butler female   DOB: Apr 06, 1951 62 y.o.   MRN: [id number]  HPI: [name] is a 62 y.o. with past medical history of diabetes, hypertension, recurrent pulmonary embolism on Coumadin, hyperlipidemia, and chronic low back pain, presents to the clinic for a routine visit and review of her medication. Patient is accompanied by her case manager from [facility]. She was recently enrolled in the [facility], in order to assist her with her home medication compliance. Her medications have been reviewed in the presence of the case manager.   The patient reveals feelings of depressed moods with episodes of staring. She also reports history of reduced to sleep, and weight loss over the last several, months. She reports lack of energy, and having thought process. She reports to be unhappy with her life. She has lost interest in pleasurable activities.   She reports that chronic low back pain, and pain in her knees significantly improved with 1 tablet of 800 mg of ibuprofen. She wonders whether I can prescribe some.   She denies any other symptoms.   Please see the A&P for the status of the pt's chronic medical problems.    Past Medical History  Diagnosis Date  . Diabetes mellitus     diagnosed in 2000.    Marland Kitchen Hypertension   . Pulmonary embolism     2011 treated at [facility] in [place].  Was on Coumadin for over a year.  no known family history  . UTI (urinary tract infection)   . High cholesterol   . 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 amitriptyline (ELAVIL) 25 MG tablet Take 2 tablets (50 mg total) by mouth at bedtime.  30 tablet  2  . amLODipine (NORVASC) 10 MG tablet Take 1 tablet (10 mg total) by mouth daily.  30 tablet  2  . Blood Glucose Monitoring Suppl (ACCU-CHEK AVIVA PLUS)  W/DEVICE KIT 1 kit by Does not apply route 3 (three) times daily. Please check blood sugar 3 to 4 times daily. diag code 250.0. Insulin dependent  1 kit  0  . glucose blood (ACCU-CHEK AVIVA) test strip Please check blood sugar 3 to 4 times daily. diag code 250.0. Insulin dependent  100 each  12  . insulin NPH-insulin regular (NOVOLIN 70/30) (70-30) 100 UNIT/ML injection Inject 15 Units into the skin 2 (two) times daily with a meal.  10 mL  12  . lisinopril-hydrochlorothiazide (PRINZIDE,ZESTORETIC) 20-25 MG per tablet Take 1 tablet by mouth daily.  30 tablet  2  . polyethylene glycol powder (MIRALAX) powder Take 17 g by mouth daily.  255 g  0  . pravastatin (PRAVACHOL) 40 MG tablet Take 1 tablet (40 mg total) by mouth every evening.  30 tablet  11  . warfarin (COUMADIN) 5 MG tablet Take as directed by anticoagulation clinic provider. Taking 3 tablets daily all days except on Monday/Thursdays--then she takes 2 and 1/2 tablets.  100 tablet  2  . Blood Pressure KIT 1 Units by Does not apply route daily.  1 each  0  . citalopram (CELEXA) 20 MG tablet Take 1 tablet (20 mg total) by mouth daily.  30 tablet  2  . ibuprofen (ADVIL) 200 MG tablet Take 2 tablets (400 mg total) by mouth every  8 (eight) hours as needed for pain.  90 tablet  1  . nicotine (NICODERM CQ - DOSED IN MG/24 HOURS) 14 mg/24hr patch Place 1 patch onto the skin daily.  28 patch  0   No current facility-administered medications for this visit.   Family History  Problem Relation Age of Onset  . Adopted: Yes   History   Social History  . Marital Status: Single    Spouse Name: N/A    Number of Children: N/A  . Years of Education: N/A   Social History Main Topics  . Smoking status: Current Every Day Smoker -- 0.20 packs/day for 10 years    Types: Cigarettes  . Smokeless tobacco: Never Used  . Alcohol Use: No  . Drug Use: No  . Sexually Active: Not Currently   Other Topics Concern  . None   Social History Narrative    Previously divorced now engaged with her partner of 4 years.  Has 6 grown children with 21 grandchildren.  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  Moved to [place] in August 2013 and was transiently homeless until first part of October 2013.     Review of Systems: Constitutional: Denies fever, chills, diaphoresis, appetite change and fatigue.  HEENT: Denies photophobia, eye pain, redness, hearing loss, ear pain, congestion, sore throat, rhinorrhea, sneezing, mouth sores, trouble swallowing, neck pain, neck stiffness and tinnitus.   Respiratory: Denies SOB, DOE, but has some irritating cough over the last week. She attributes it to the flue shot she had at her last visit. No chest tightness,  and wheezing.   Cardiovascular: Denies chest pain, palpitations and leg swelling.  Gastrointestinal: Denies nausea, vomiting, abdominal pain, diarrhea, constipation, blood in stool and abdominal distention.  Genitourinary: Denies dysuria, urgency, frequency, hematuria, flank pain and difficulty urinating. Hematological: Denies adenopathy. Easy bruising, personal or family bleeding history  Psychiatric/Behavioral: Denies suicidal ideation, mood changes, confusion, nervousness, sleep disturbance and agitation  Objective:  Physical Exam: Filed Vitals:   03/01/13 1600  BP: 102/66  Pulse: 108  Temp: 98.6 F (37 C)  TempSrc: Oral  Height: 5\' 9"  (1.753 m)  Weight: 237 lb 1.6 oz (107.548 kg)  SpO2: 98%   Timothy of [facility] at bedside. Constitutional: Vital signs reviewed.  Patient is a well-developed and well-nourished in no acute distress and cooperative with exam. Alert and oriented x3.  Eyes: PERRL, EOMI, conjunctivae normal, No scleral icterus.  Neck: Supple, Trachea midline normal ROM, No JVD, mass, thyromegaly, or carotid bruit present.  Cardiovascular: RRR, S1 normal, S2 normal, no MRG, pulses symmetric and intact bilaterally Pulmonary/Chest: CTAB, no wheezes,  rales, or rhonchi Abdominal: Soft. Non-tender, non-distended, bowel sounds are normal, no masses, organomegaly, or guarding present.  GU: no CVA tenderness Musculoskeletal: No joint deformities, erythema, or stiffness, ROM full and no nontender Hematology: no cervical, inginal, or axillary adenopathy.  Neurological: A&O x3, Strength is normal and symmetric bilaterally, cranial nerve II-XII are grossly intact, no focal motor deficit, sensory intact to light touch bilaterally.  Skin: Warm, dry and intact. No rash, cyanosis, or clubbing.  Psychiatric: She is tearful. She reports being unhappy in her life with lack of interest in pleasurable activities. She has reduced sleep. She denies suicide ideations. Normal mood and affect. speech and behavior is normal. Judgment and thought content normal. Cognition and memory are normal.   Assessment & Plan:  I have discussed my assessment, and plan with Dr. Rogelia Boga has detailed under each  problem.  In brief, this visit has concentrated mainly on coordination of care especially with [facility], home health nurse and physical therapy. Also, she will be initiated on treatment for depression with Celexa 20 mg once daily. She will follow in about 2 weeks and the dose of Celexa can be titrated upwards. She will need to a long slot. She has been encouraged to bring her medications and glucose meter.

## 2013-03-01 NOTE — Progress Notes (Signed)
Patient ID: Joy Butler, female   DOB: [birth year], 62 y.o.   MRN: [id number] CSW received note from [facility]: Dr Zada Girt, Thank you for reaching out to [place] for an update regarding Joy Butler.  We have been able to engage her for home visits and telephonic management.  On 2.14.14 we purchased a 30-day supply of Lisinopril/HCTZ 20/25, Novolin 70/30, Keterolac 10mg , and Amitriptyline 25mg .  She had all of her other orders medicines including coumadin.  We are prepared to assist again this month in addition to exploring Low Income Subsidy (LIS) options for medication procurement.  We suspect that she is forgetfull.  Furthermore, she clearly does not understand all of her medications.  We are having difficulty determining the level of family assistance the patient receives.  We would like to recommend a family meeting to engage the entire household in her wellness plan.  In addition, to better ascertain her medication compliance we would like to add a home health RN for medication and diabetes management.  This acute short term intervention will hopefully provide [place] with the intensity of service to get a handle on her coumadin and hypertension management as well as glycemic control.  I am available to attend her appointment tomorrow if needed.  Thank you for your diligent support of this patient.

## 2013-03-01 NOTE — Assessment & Plan Note (Signed)
She reports improvement with ibuprofen 800 mg. She has not yet gone to outpatient physical therapy. We will discontinue referral to outpatient physical therapy and instead will do home health PT, with RN due to has significant weakness in her legs, which makes outpatient PT challenging for her.   Plan  - d/c referral to outpatient PT  - Order [facility] PT and [facility] RN.

## 2013-03-01 NOTE — Patient Instructions (Signed)
Please increase insulin to 15 units every 12 hours  Please start taking Celexa 20 mg once daily  Please take ibuprofen 800mg  three times daily as need for pain  Please bring your meter on next visit  Treatment Goals:  Goals (1 Years of Data) as of 03/01/13         As of Today 02/17/13 01/30/13 01/30/13 11/25/12     Blood Pressure    . Blood Pressure < 120/80  102/66 135/82 130/70 155/95 135/89     Result Component    . HEMOGLOBIN A1C < 7.0    9.6      . LDL CALC < 70            Progress Toward Treatment Goals:  Treatment Goal 03/01/2013  Hemoglobin A1C unchanged  Blood pressure at goal  Stop smoking stopped smoking  Other  unchanged    Self Care Goals & Plans:       Care Management & Community Referrals:  Referral 02/17/2013  Referrals made for care management support [facility] case management program

## 2013-03-01 NOTE — Assessment & Plan Note (Signed)
Lab Results  Component Value Date   HGBA1C 9.6 01/30/2013   HGBA1C 8.8 10/21/2012   HGBA1C 10.4* 08/31/2012     Assessment:  Diabetes control: poor control (HgbA1C >9%)  Progress toward A1C goal:  unchanged  Comments: Mainly due to noncompliance with her insulin regimen.  Plan:  Medications: Will increase the dose of Lantus to 15 international units twice a day. Home glucose monitoring: 3 times daily   Frequency:   3 times daily   Timing:   before breakfast, before lunch, before dinner  Instruction/counseling given: Patient will be assisted through home health with home visits.  Educational resources provided:  [facility] involved in her care since Mid-Feb/2014  Self management tools provided:    Other plans: Home health RN. Emphasis has been made to the patient to bring her glucose meter on her next visit. Patient has significant noncompliance with insulin regimen. I would, therefore, not start her on NovoLog insulin. In the future, however, this is an option as [facility] gets more involved in her care and as she becomes more compliant. She will need a lot of help.

## 2013-03-01 NOTE — Telephone Encounter (Signed)
Patient presented to [facility] while I happened to be there (non-clinic day). We performed an INR = 2.1 We will continue same regimen of warfarin (3x5mg  tablets) on 5 days of week; 2 and 1/2 x 5mg  tablets on all other days. RTC on 24-MAR-14. Refill was sent electronically to [facility] at [facility] in [place] for 100 tablets 2 RF; sig as directed.

## 2013-03-02 ENCOUNTER — Encounter: Payer: Self-pay | Admitting: Licensed Clinical Social Worker

## 2013-03-02 ENCOUNTER — Telehealth: Payer: Self-pay | Admitting: Licensed Clinical Social Worker

## 2013-03-02 NOTE — Telephone Encounter (Signed)
CSW placed call to [name] for home health referral.  Pt has no preference for agency and is in agreement to utilize [facility].  Referral placed with [facility].  Pt voiced concern regarding the co pay cost of her new medications and transportation to an eye appt on 03/13/13.  [facility] is aware of both and will explore assistance options.  Joy Butler also requested information on Food Pantry in [place] area.  CSW discussed current listing of food pantry/hot meal locations.  Pt requested CSW to mail out.  CSW will mail out along with [place] brochure and information on 2-1-1, as pt states she has yet to learn services available in [place] area.  Pt denies add'l needs at this time.

## 2013-03-05 DIAGNOSIS — Z5181 Encounter for therapeutic drug level monitoring: Secondary | ICD-10-CM | POA: Diagnosis not present

## 2013-03-05 DIAGNOSIS — I2699 Other pulmonary embolism without acute cor pulmonale: Secondary | ICD-10-CM | POA: Diagnosis not present

## 2013-03-05 DIAGNOSIS — M549 Dorsalgia, unspecified: Secondary | ICD-10-CM | POA: Diagnosis not present

## 2013-03-07 ENCOUNTER — Encounter: Payer: Self-pay | Admitting: *Deleted

## 2013-03-07 DIAGNOSIS — E1149 Type 2 diabetes mellitus with other diabetic neurological complication: Secondary | ICD-10-CM | POA: Diagnosis not present

## 2013-03-08 ENCOUNTER — Telehealth: Payer: Self-pay | Admitting: *Deleted

## 2013-03-08 NOTE — Telephone Encounter (Signed)
Pharmacy wants to know what kind of "Blood Pressure Kit" you want the pt to have. Pharmacy # [phone]

## 2013-03-08 NOTE — Telephone Encounter (Signed)
I called the pharmacy back and they informed me that they do not cover blood pressure machine/cuf. They have the Amron type but she will not be able to get it, anyways without a benefits card with her [employer].

## 2013-03-09 ENCOUNTER — Encounter: Payer: Self-pay | Admitting: Licensed Clinical Social Worker

## 2013-03-09 DIAGNOSIS — I10 Essential (primary) hypertension: Secondary | ICD-10-CM | POA: Diagnosis not present

## 2013-03-09 NOTE — Progress Notes (Signed)
Patient ID: Joy Butler, female   DOB: 08-Nov-1951, 62 y.o.   MRN: [id number] CSW had discussion regarding [name]'s case with [facility] care manager, Maren Beach.  [facility] has arranged transportation for pt's upcoming appt's and has provided pt with food and financial resources. Pt is not interested in applying for food stamps at this time, as pt states her s/o receives them.  [facility] has been trying to reach [name] via telephone to provide add'l resources and information on group counseling.  [facility] states pt has not been too forthcoming about household situation.  Providing resources based upon information pt provides.

## 2013-03-13 ENCOUNTER — Other Ambulatory Visit: Payer: Self-pay | Admitting: Internal Medicine

## 2013-03-13 DIAGNOSIS — E1142 Type 2 diabetes mellitus with diabetic polyneuropathy: Secondary | ICD-10-CM

## 2013-03-13 MED ORDER — AMITRIPTYLINE HCL 25 MG PO TABS: 25.0000 mg | ORAL_TABLET | Freq: Every day | ORAL | Status: DC

## 2013-03-15 ENCOUNTER — Encounter: Payer: Self-pay | Admitting: Internal Medicine

## 2013-03-15 ENCOUNTER — Ambulatory Visit (INDEPENDENT_AMBULATORY_CARE_PROVIDER_SITE_OTHER): Payer: Medicare Other | Admitting: Internal Medicine

## 2013-03-15 VITALS — BP 118/76 | HR 102 | Temp 96.9°F | Ht 69.0 in | Wt 238.7 lb

## 2013-03-15 DIAGNOSIS — E1149 Type 2 diabetes mellitus with other diabetic neurological complication: Secondary | ICD-10-CM

## 2013-03-15 DIAGNOSIS — E119 Type 2 diabetes mellitus without complications: Secondary | ICD-10-CM

## 2013-03-15 DIAGNOSIS — J069 Acute upper respiratory infection, unspecified: Secondary | ICD-10-CM | POA: Diagnosis not present

## 2013-03-15 DIAGNOSIS — D892 Hypergammaglobulinemia, unspecified: Secondary | ICD-10-CM | POA: Diagnosis not present

## 2013-03-15 DIAGNOSIS — E7800 Pure hypercholesterolemia, unspecified: Secondary | ICD-10-CM | POA: Diagnosis not present

## 2013-03-15 DIAGNOSIS — I10 Essential (primary) hypertension: Secondary | ICD-10-CM | POA: Diagnosis not present

## 2013-03-15 DIAGNOSIS — M549 Dorsalgia, unspecified: Secondary | ICD-10-CM | POA: Diagnosis not present

## 2013-03-15 DIAGNOSIS — F329 Major depressive disorder, single episode, unspecified: Secondary | ICD-10-CM

## 2013-03-15 DIAGNOSIS — I2699 Other pulmonary embolism without acute cor pulmonale: Secondary | ICD-10-CM

## 2013-03-15 DIAGNOSIS — L0291 Cutaneous abscess, unspecified: Secondary | ICD-10-CM | POA: Diagnosis not present

## 2013-03-15 DIAGNOSIS — Z7901 Long term (current) use of anticoagulants: Secondary | ICD-10-CM | POA: Diagnosis not present

## 2013-03-15 DIAGNOSIS — E1142 Type 2 diabetes mellitus with diabetic polyneuropathy: Secondary | ICD-10-CM | POA: Diagnosis not present

## 2013-03-15 LAB — POCT INR: INR: 5.1

## 2013-03-15 LAB — GLUCOSE, CAPILLARY: Glucose-Capillary: 296 mg/dL — ABNORMAL HIGH (ref 70–99)

## 2013-03-15 MED ORDER — INSULIN NPH ISOPHANE & REGULAR (70-30) 100 UNIT/ML ~~LOC~~ SUSP: 20.0000 [IU] | Freq: Two times a day (BID) | SUBCUTANEOUS | Status: DC

## 2013-03-15 MED ORDER — WARFARIN SODIUM 5 MG PO TABS: ORAL_TABLET | ORAL | Status: DC

## 2013-03-15 MED ORDER — AMITRIPTYLINE HCL 25 MG PO TABS: 25.0000 mg | ORAL_TABLET | Freq: Every day | ORAL | Status: DC

## 2013-03-15 NOTE — Progress Notes (Signed)
Subjective:   Patient ID: Joy Butler female   DOB: 1951/01/16 62 y.o.   MRN: [id number]  HPI: Joy Butler is a 62 y.o. woman who presents to clinic today for follow up from her last appointment.  She states that she is doing better and denies any side effects from new medications.  See Problem focused Assessment and Plan for full details of her chronic medical conditions.   Past Medical History  Diagnosis Date  . Diabetes mellitus     diagnosed in 2000.    Marland Kitchen Hypertension   . Pulmonary embolism     2011 treated at [facility] in [place].  Was on Coumadin for over a year.  no known family history  . UTI (urinary tract infection)   . High cholesterol   . 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 amitriptyline (ELAVIL) 25 MG tablet Take 1 tablet (25 mg total) by mouth at bedtime.  30 tablet  0  . amLODipine (NORVASC) 10 MG tablet Take 1 tablet (10 mg total) by mouth daily.  30 tablet  2  . Blood Glucose Monitoring Suppl (ACCU-CHEK AVIVA PLUS) W/DEVICE KIT 1 kit by Does not apply route 3 (three) times daily. Please check blood sugar 3 to 4 times daily. diag code 250.0. Insulin dependent  1 kit  0  . Blood Pressure KIT 1 Units by Does not apply route daily.  1 each  0  . citalopram (CELEXA) 20 MG tablet Take 1 tablet (20 mg total) by mouth daily.  30 tablet  2  . glucose blood (ACCU-CHEK AVIVA) test strip Please check blood sugar 3 to 4 times daily. diag code 250.0. Insulin dependent  100 each  12  . ibuprofen (ADVIL) 200 MG tablet Take 2 tablets (400 mg total) by mouth every 8 (eight) hours as needed for pain.  90 tablet  1  . insulin NPH-insulin regular (NOVOLIN 70/30) (70-30) 100 UNIT/ML injection Inject 15 Units into the skin 2 (two) times daily with a meal.  10 mL  12  . lisinopril-hydrochlorothiazide (PRINZIDE,ZESTORETIC) 20-25 MG per tablet Take 1 tablet by mouth daily.  30 tablet  2  . nicotine  (NICODERM CQ - DOSED IN MG/24 HOURS) 14 mg/24hr patch Place 1 patch onto the skin daily.  28 patch  0  . polyethylene glycol powder (MIRALAX) powder Take 17 g by mouth daily.  255 g  0  . pravastatin (PRAVACHOL) 40 MG tablet Take 1 tablet (40 mg total) by mouth every evening.  30 tablet  11  . warfarin (COUMADIN) 5 MG tablet Take as directed by anticoagulation clinic provider. Taking 3 tablets daily all days except on Monday/Thursdays--then she takes 2 and 1/2 tablets.  100 tablet  2   No current facility-administered medications for this visit.   Family History  Problem Relation Age of Onset  . Adopted: Yes   History   Social History  . Marital Status: Single    Spouse Name: N/A    Number of Children: N/A  . Years of Education: N/A   Social History Main Topics  . Smoking status: Current Every Day Smoker -- 0.05 packs/day for 10 years    Types: Cigarettes  . Smokeless tobacco: Never Used  . Alcohol Use: No  . Drug Use: No  . Sexually Active: Not Currently   Other Topics Concern  . None   Social History Narrative  Previously divorced now engaged with her partner of 4 years.  Has 6 grown children with 21 grandchildren.  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  Moved to [place] in August 2013 and was transiently homeless until first part of October 2013.     Review of Systems: Constitutional: Denies fever, chills, diaphoresis, appetite change and fatigue.  HEENT: Denies photophobia, eye pain, redness, hearing loss, ear pain, congestion, sore throat, rhinorrhea, sneezing, mouth sores, trouble swallowing, neck pain, neck stiffness and tinnitus.   Respiratory: Denies SOB, DOE, cough, chest tightness,  and wheezing.   Cardiovascular: Denies chest pain, palpitations and leg swelling.  Gastrointestinal: Denies nausea, vomiting, abdominal pain, diarrhea, constipation, blood in stool and abdominal distention.  Genitourinary: Denies dysuria,  urgency, frequency, hematuria, flank pain and difficulty urinating.  Musculoskeletal: Denies myalgias, back pain, joint swelling, arthralgias and gait problem.  Skin: Denies pallor, rash and wound.  Neurological: Denies dizziness, seizures, syncope, weakness, light-headedness, numbness and headaches.  Hematological: Denies adenopathy. Easy bruising, personal or family bleeding history  Psychiatric/Behavioral: Denies suicidal ideation, mood changes, confusion, nervousness, sleep disturbance and agitation  Objective:  Physical Exam: Filed Vitals:   03/15/13 1350  BP: 118/76  Pulse: 102  Temp: 96.9 F (36.1 C)  TempSrc: Oral  Height: 5\' 9"  (1.753 m)  Weight: 238 lb 11.2 oz (108.274 kg)  SpO2: 96%   Constitutional: Vital signs reviewed.  Patient is a well-developed and well-nourished woman in no acute distress and cooperative with exam. Alert and oriented x3.  Head: Normocephalic and atraumatic Ear: TM normal bilaterally Mouth: no erythema or exudates, MMM Eyes: PERRL, EOMI, conjunctivae normal, No scleral icterus.  Neck: Supple, Trachea midline normal ROM, No JVD, mass, thyromegaly, or carotid bruit present.  Cardiovascular: RRR, S1 normal, S2 normal, no MRG, pulses symmetric and intact bilaterally Pulmonary/Chest: CTAB, no wheezes, rales, or rhonchi Abdominal: Soft. Non-tender, non-distended, bowel sounds are normal, no masses, organomegaly, or guarding present.  GU: no CVA tenderness Musculoskeletal: No joint deformities, erythema, or stiffness, ROM full and no nontender Hematology: no cervical, inginal, or axillary adenopathy.  Neurological: A&O x3, Strength is normal and symmetric bilaterally, cranial nerve II-XII are grossly intact, no focal motor deficit, sensory intact to light touch bilaterally.  Skin: Warm, dry and intact. No rash, cyanosis, or clubbing.  Psychiatric: Normal mood and affect. speech and behavior is normal. Judgment and thought content normal. Cognition and  memory are normal.   Assessment & Plan:

## 2013-03-15 NOTE — Patient Instructions (Addendum)
1.  Increase the 70/30 insulin to 20 units twice daily.  Try to take it 15-30 minutes before you eat.  2.  Pick up the amitriptyline and take it at bedtime.  3.  DO NOT take tonights dose of coumadin  - Starting tomorrow until you see Dr. Alexandria Lodge take 2 1/2 tablets daily.  4.  Follow up on April 7th with Dr. Alexandria Lodge  5.  Follow up in 4 weeks with Dr. Zada Girt to see how your mood and blood sugar are doing.

## 2013-03-16 ENCOUNTER — Telehealth: Payer: Self-pay | Admitting: *Deleted

## 2013-03-16 DIAGNOSIS — F17200 Nicotine dependence, unspecified, uncomplicated: Secondary | ICD-10-CM | POA: Diagnosis not present

## 2013-03-16 DIAGNOSIS — I2699 Other pulmonary embolism without acute cor pulmonale: Secondary | ICD-10-CM | POA: Diagnosis not present

## 2013-03-16 DIAGNOSIS — I10 Essential (primary) hypertension: Secondary | ICD-10-CM | POA: Diagnosis not present

## 2013-03-16 DIAGNOSIS — E1142 Type 2 diabetes mellitus with diabetic polyneuropathy: Secondary | ICD-10-CM | POA: Diagnosis not present

## 2013-03-16 DIAGNOSIS — M549 Dorsalgia, unspecified: Secondary | ICD-10-CM | POA: Diagnosis not present

## 2013-03-16 DIAGNOSIS — E1149 Type 2 diabetes mellitus with other diabetic neurological complication: Secondary | ICD-10-CM | POA: Diagnosis not present

## 2013-03-16 NOTE — Telephone Encounter (Signed)
Call from Onalee Hua,  Physical Therapist with [facility] - [phone] He did PT evaluation today.  He would like to see her  3 times a  Week next week and  2 times a week the week after Working on fall prevention and lower extremity strengthing.  I gave the verbal order. Is that okay with you?

## 2013-03-20 DIAGNOSIS — E1142 Type 2 diabetes mellitus with diabetic polyneuropathy: Secondary | ICD-10-CM | POA: Diagnosis not present

## 2013-03-20 DIAGNOSIS — E1149 Type 2 diabetes mellitus with other diabetic neurological complication: Secondary | ICD-10-CM | POA: Diagnosis not present

## 2013-03-20 DIAGNOSIS — I10 Essential (primary) hypertension: Secondary | ICD-10-CM | POA: Diagnosis not present

## 2013-03-20 DIAGNOSIS — I2699 Other pulmonary embolism without acute cor pulmonale: Secondary | ICD-10-CM | POA: Diagnosis not present

## 2013-03-20 DIAGNOSIS — M549 Dorsalgia, unspecified: Secondary | ICD-10-CM | POA: Diagnosis not present

## 2013-03-20 DIAGNOSIS — F17200 Nicotine dependence, unspecified, uncomplicated: Secondary | ICD-10-CM | POA: Diagnosis not present

## 2013-03-22 DIAGNOSIS — I2699 Other pulmonary embolism without acute cor pulmonale: Secondary | ICD-10-CM | POA: Diagnosis not present

## 2013-03-22 DIAGNOSIS — E1142 Type 2 diabetes mellitus with diabetic polyneuropathy: Secondary | ICD-10-CM | POA: Diagnosis not present

## 2013-03-22 DIAGNOSIS — I10 Essential (primary) hypertension: Secondary | ICD-10-CM | POA: Diagnosis not present

## 2013-03-22 DIAGNOSIS — E1149 Type 2 diabetes mellitus with other diabetic neurological complication: Secondary | ICD-10-CM | POA: Diagnosis not present

## 2013-03-22 DIAGNOSIS — F17200 Nicotine dependence, unspecified, uncomplicated: Secondary | ICD-10-CM | POA: Diagnosis not present

## 2013-03-22 DIAGNOSIS — M549 Dorsalgia, unspecified: Secondary | ICD-10-CM | POA: Diagnosis not present

## 2013-03-23 ENCOUNTER — Telehealth: Payer: Self-pay | Admitting: *Deleted

## 2013-03-23 DIAGNOSIS — M549 Dorsalgia, unspecified: Secondary | ICD-10-CM | POA: Diagnosis not present

## 2013-03-23 DIAGNOSIS — E1149 Type 2 diabetes mellitus with other diabetic neurological complication: Secondary | ICD-10-CM | POA: Diagnosis not present

## 2013-03-23 DIAGNOSIS — I2699 Other pulmonary embolism without acute cor pulmonale: Secondary | ICD-10-CM | POA: Diagnosis not present

## 2013-03-23 DIAGNOSIS — F17200 Nicotine dependence, unspecified, uncomplicated: Secondary | ICD-10-CM | POA: Diagnosis not present

## 2013-03-23 DIAGNOSIS — I10 Essential (primary) hypertension: Secondary | ICD-10-CM | POA: Diagnosis not present

## 2013-03-23 DIAGNOSIS — E1142 Type 2 diabetes mellitus with diabetic polyneuropathy: Secondary | ICD-10-CM | POA: Diagnosis not present

## 2013-03-23 NOTE — Telephone Encounter (Signed)
Call from Diane, RN with [facility] - [phone] She is asking for an extension on Nursing Visits to see patient once a week for 3 weeks for BP checks.  Patient reports to nurse that BP is elevated 140/114 . This was after finding out she had a large bill to pay. Today  Nurse reports BP 130/ 80.  I gave the Verbal okay for 3 added nurse visits.   Is this okay with you?

## 2013-03-24 DIAGNOSIS — E1149 Type 2 diabetes mellitus with other diabetic neurological complication: Secondary | ICD-10-CM | POA: Diagnosis not present

## 2013-03-24 DIAGNOSIS — E1142 Type 2 diabetes mellitus with diabetic polyneuropathy: Secondary | ICD-10-CM | POA: Diagnosis not present

## 2013-03-24 DIAGNOSIS — I2699 Other pulmonary embolism without acute cor pulmonale: Secondary | ICD-10-CM | POA: Diagnosis not present

## 2013-03-24 DIAGNOSIS — F17200 Nicotine dependence, unspecified, uncomplicated: Secondary | ICD-10-CM | POA: Diagnosis not present

## 2013-03-24 DIAGNOSIS — I10 Essential (primary) hypertension: Secondary | ICD-10-CM | POA: Diagnosis not present

## 2013-03-24 DIAGNOSIS — M549 Dorsalgia, unspecified: Secondary | ICD-10-CM | POA: Diagnosis not present

## 2013-03-27 ENCOUNTER — Ambulatory Visit: Payer: Medicare Other | Attending: Internal Medicine | Admitting: Physical Therapy

## 2013-03-27 ENCOUNTER — Ambulatory Visit (INDEPENDENT_AMBULATORY_CARE_PROVIDER_SITE_OTHER): Payer: Medicare Other | Admitting: Pharmacist

## 2013-03-27 DIAGNOSIS — Z23 Encounter for immunization: Secondary | ICD-10-CM | POA: Diagnosis not present

## 2013-03-27 DIAGNOSIS — F17200 Nicotine dependence, unspecified, uncomplicated: Secondary | ICD-10-CM | POA: Diagnosis not present

## 2013-03-27 DIAGNOSIS — E1149 Type 2 diabetes mellitus with other diabetic neurological complication: Secondary | ICD-10-CM | POA: Diagnosis not present

## 2013-03-27 DIAGNOSIS — J069 Acute upper respiratory infection, unspecified: Secondary | ICD-10-CM | POA: Diagnosis not present

## 2013-03-27 DIAGNOSIS — F329 Major depressive disorder, single episode, unspecified: Secondary | ICD-10-CM | POA: Diagnosis not present

## 2013-03-27 DIAGNOSIS — Z7901 Long term (current) use of anticoagulants: Secondary | ICD-10-CM | POA: Diagnosis not present

## 2013-03-27 DIAGNOSIS — Z79899 Other long term (current) drug therapy: Secondary | ICD-10-CM | POA: Diagnosis not present

## 2013-03-27 DIAGNOSIS — I2699 Other pulmonary embolism without acute cor pulmonale: Secondary | ICD-10-CM

## 2013-03-27 DIAGNOSIS — E7800 Pure hypercholesterolemia, unspecified: Secondary | ICD-10-CM | POA: Diagnosis not present

## 2013-03-27 DIAGNOSIS — I10 Essential (primary) hypertension: Secondary | ICD-10-CM | POA: Diagnosis not present

## 2013-03-27 DIAGNOSIS — G8929 Other chronic pain: Secondary | ICD-10-CM | POA: Diagnosis not present

## 2013-03-27 DIAGNOSIS — M549 Dorsalgia, unspecified: Secondary | ICD-10-CM | POA: Diagnosis not present

## 2013-03-27 DIAGNOSIS — L0291 Cutaneous abscess, unspecified: Secondary | ICD-10-CM | POA: Diagnosis not present

## 2013-03-27 DIAGNOSIS — E1142 Type 2 diabetes mellitus with diabetic polyneuropathy: Secondary | ICD-10-CM | POA: Diagnosis not present

## 2013-03-27 DIAGNOSIS — E119 Type 2 diabetes mellitus without complications: Secondary | ICD-10-CM | POA: Diagnosis not present

## 2013-03-27 NOTE — Patient Instructions (Signed)
Patient instructed to take medications as defined in the Anti-coagulation Track section of this encounter.  Patient instructed to take today's dose.  Patient verbalized understanding of these instructions.    

## 2013-03-27 NOTE — Addendum Note (Signed)
Addended by: Neomia Dear on: 03/27/2013 08:14 PM   Modules accepted: Orders

## 2013-03-27 NOTE — Progress Notes (Signed)
Anti-Coagulation Progress Note  Joy Butler is a 62 y.o. female who is currently on an anti-coagulation regimen.    RECENT RESULTS: Recent results are below, the most recent result is correlated with a dose of 70 mg. per week:  He was supposed to have been taking 87.5mg  per week based upon a phone discussion with Dr. Tonny Branch on 26-MAR-14 at which time I was on campus of the school of pharmacy. The patient misunderstood/did not comply with the instructions provided by Dr. Tonny Branch. This INR - 1.0 reflects only 70mg /wk when we had intended 87.5mg /wk. Will increase and see in one week. Lab Results  Component Value Date   INR 1.0 03/27/2013   INR 5.1 03/15/2013   INR 1.1 02/17/2013    ANTI-COAG DOSE: Anticoagulation Dose Instructions as of 03/27/2013     [name] Tue Wed Thu Fri Sat   New Dose 12.5 mg 15 mg 12.5 mg 12.5 mg 15 mg 12.5 mg 12.5 mg       ANTICOAG SUMMARY: Anticoagulation Episode Summary   Current INR goal 2.0-3.0  Next INR check 04/03/2013  INR from last check 1.0! 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 Duration of therapy should be indefinite but with periodic assessment and advice and consent of the patient insofar as continuing indefiite warfarin therapy as per guidelines published in supplement to Journal CHEST.        ANTICOAG TODAY: Anticoagulation Summary as of 03/27/2013   INR goal 2.0-3.0  Selected INR 1.0! 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 Duration of therapy should be indefinite but with periodic assessment and advice and consent of the patient insofar as continuing indefiite warfarin therapy as per guidelines published in supplement to Journal CHEST.      PATIENT INSTRUCTIONS: Patient Instructions  Patient instructed to take medications as defined in the Anti-coagulation Track section of this encounter.  Patient instructed to take today's dose.  Patient verbalized understanding of these instructions.       FOLLOW-UP Return in 7 days (on 04/03/2013) for Follow up INR at 2:15PM.  Hulen Luster, III Pharm.D., CACP

## 2013-03-28 DIAGNOSIS — I2699 Other pulmonary embolism without acute cor pulmonale: Secondary | ICD-10-CM | POA: Diagnosis not present

## 2013-03-28 DIAGNOSIS — E1142 Type 2 diabetes mellitus with diabetic polyneuropathy: Secondary | ICD-10-CM | POA: Diagnosis not present

## 2013-03-28 DIAGNOSIS — E1149 Type 2 diabetes mellitus with other diabetic neurological complication: Secondary | ICD-10-CM | POA: Diagnosis not present

## 2013-03-28 DIAGNOSIS — I10 Essential (primary) hypertension: Secondary | ICD-10-CM | POA: Diagnosis not present

## 2013-03-28 DIAGNOSIS — M549 Dorsalgia, unspecified: Secondary | ICD-10-CM | POA: Diagnosis not present

## 2013-03-28 DIAGNOSIS — F17200 Nicotine dependence, unspecified, uncomplicated: Secondary | ICD-10-CM | POA: Diagnosis not present

## 2013-03-28 NOTE — Telephone Encounter (Signed)
Yes. I am okay with the visits.

## 2013-03-30 DIAGNOSIS — I2699 Other pulmonary embolism without acute cor pulmonale: Secondary | ICD-10-CM | POA: Diagnosis not present

## 2013-03-30 DIAGNOSIS — F17200 Nicotine dependence, unspecified, uncomplicated: Secondary | ICD-10-CM | POA: Diagnosis not present

## 2013-03-30 DIAGNOSIS — M549 Dorsalgia, unspecified: Secondary | ICD-10-CM | POA: Diagnosis not present

## 2013-03-30 DIAGNOSIS — E1142 Type 2 diabetes mellitus with diabetic polyneuropathy: Secondary | ICD-10-CM | POA: Diagnosis not present

## 2013-03-30 DIAGNOSIS — E1149 Type 2 diabetes mellitus with other diabetic neurological complication: Secondary | ICD-10-CM | POA: Diagnosis not present

## 2013-03-30 DIAGNOSIS — I10 Essential (primary) hypertension: Secondary | ICD-10-CM | POA: Diagnosis not present

## 2013-04-03 ENCOUNTER — Ambulatory Visit: Payer: Medicare Other

## 2013-04-05 ENCOUNTER — Other Ambulatory Visit: Payer: Self-pay | Admitting: *Deleted

## 2013-04-05 DIAGNOSIS — I2699 Other pulmonary embolism without acute cor pulmonale: Secondary | ICD-10-CM

## 2013-04-05 DIAGNOSIS — E119 Type 2 diabetes mellitus without complications: Secondary | ICD-10-CM

## 2013-04-05 DIAGNOSIS — F329 Major depressive disorder, single episode, unspecified: Secondary | ICD-10-CM

## 2013-04-05 DIAGNOSIS — I10 Essential (primary) hypertension: Secondary | ICD-10-CM

## 2013-04-05 DIAGNOSIS — E7800 Pure hypercholesterolemia, unspecified: Secondary | ICD-10-CM

## 2013-04-05 NOTE — Telephone Encounter (Signed)
Pt changing pharmacy to [facility], mail order.  Please add diagnosis code on all diabetic supplies and directions.

## 2013-04-06 ENCOUNTER — Other Ambulatory Visit: Payer: Self-pay | Admitting: Internal Medicine

## 2013-04-06 DIAGNOSIS — E1142 Type 2 diabetes mellitus with diabetic polyneuropathy: Secondary | ICD-10-CM | POA: Diagnosis not present

## 2013-04-06 DIAGNOSIS — F17200 Nicotine dependence, unspecified, uncomplicated: Secondary | ICD-10-CM | POA: Diagnosis not present

## 2013-04-06 DIAGNOSIS — M549 Dorsalgia, unspecified: Secondary | ICD-10-CM | POA: Diagnosis not present

## 2013-04-06 DIAGNOSIS — I2699 Other pulmonary embolism without acute cor pulmonale: Secondary | ICD-10-CM | POA: Diagnosis not present

## 2013-04-06 DIAGNOSIS — I10 Essential (primary) hypertension: Secondary | ICD-10-CM | POA: Diagnosis not present

## 2013-04-06 DIAGNOSIS — E1149 Type 2 diabetes mellitus with other diabetic neurological complication: Secondary | ICD-10-CM | POA: Diagnosis not present

## 2013-04-06 DIAGNOSIS — E119 Type 2 diabetes mellitus without complications: Secondary | ICD-10-CM

## 2013-04-06 MED ORDER — INSULIN SYRINGES (DISPOSABLE) U-100 1 ML MISC: 1.0000 [IU] | Freq: Two times a day (BID) | Status: DC

## 2013-04-06 MED ORDER — INSULIN NPH ISOPHANE & REGULAR (70-30) 100 UNIT/ML ~~LOC~~ SUSP: 20.0000 [IU] | Freq: Two times a day (BID) | SUBCUTANEOUS | Status: DC

## 2013-04-06 MED ORDER — PRAVASTATIN SODIUM 40 MG PO TABS: 40.0000 mg | ORAL_TABLET | Freq: Every evening | ORAL | Status: DC

## 2013-04-06 MED ORDER — GLUCOSE BLOOD VI STRP: ORAL_STRIP | Status: DC

## 2013-04-06 MED ORDER — AMLODIPINE BESYLATE 10 MG PO TABS: 10.0000 mg | ORAL_TABLET | Freq: Every day | ORAL | Status: DC

## 2013-04-06 MED ORDER — ACCU-CHEK SOFTCLIX LANCET DEV MISC: Status: DC

## 2013-04-06 MED ORDER — LISINOPRIL-HYDROCHLOROTHIAZIDE 20-25 MG PO TABS: 1.0000 | ORAL_TABLET | Freq: Every day | ORAL | Status: DC

## 2013-04-06 NOTE — Telephone Encounter (Signed)
Lisinopril/HCTZ and Pravastatin called to [facility].  Dr Kirtland Bouchard - Novolog 70/30 rx states "print" - cannot be call in to the pharmacy;has to be either electronic sent or faxed. Thanks

## 2013-04-06 NOTE — Telephone Encounter (Signed)
Thanks

## 2013-04-10 NOTE — Telephone Encounter (Signed)
Rx called in to pharmacy. 

## 2013-04-12 ENCOUNTER — Other Ambulatory Visit: Payer: Self-pay | Admitting: *Deleted

## 2013-04-12 ENCOUNTER — Ambulatory Visit: Payer: Medicare Other | Admitting: Internal Medicine

## 2013-04-12 DIAGNOSIS — E119 Type 2 diabetes mellitus without complications: Secondary | ICD-10-CM

## 2013-04-12 DIAGNOSIS — I2699 Other pulmonary embolism without acute cor pulmonale: Secondary | ICD-10-CM

## 2013-04-12 DIAGNOSIS — Z7901 Long term (current) use of anticoagulants: Secondary | ICD-10-CM

## 2013-04-12 DIAGNOSIS — E1142 Type 2 diabetes mellitus with diabetic polyneuropathy: Secondary | ICD-10-CM

## 2013-04-12 NOTE — Telephone Encounter (Signed)
Pt is using [facility]; need new rx.

## 2013-04-13 DIAGNOSIS — F17200 Nicotine dependence, unspecified, uncomplicated: Secondary | ICD-10-CM | POA: Diagnosis not present

## 2013-04-13 DIAGNOSIS — E1142 Type 2 diabetes mellitus with diabetic polyneuropathy: Secondary | ICD-10-CM | POA: Diagnosis not present

## 2013-04-13 DIAGNOSIS — E1149 Type 2 diabetes mellitus with other diabetic neurological complication: Secondary | ICD-10-CM | POA: Diagnosis not present

## 2013-04-13 DIAGNOSIS — I2699 Other pulmonary embolism without acute cor pulmonale: Secondary | ICD-10-CM | POA: Diagnosis not present

## 2013-04-13 DIAGNOSIS — M549 Dorsalgia, unspecified: Secondary | ICD-10-CM | POA: Diagnosis not present

## 2013-04-13 DIAGNOSIS — I10 Essential (primary) hypertension: Secondary | ICD-10-CM | POA: Diagnosis not present

## 2013-04-13 MED ORDER — WARFARIN SODIUM 5 MG PO TABS: 12.5000 mg | ORAL_TABLET | Freq: Every day | ORAL | Status: DC

## 2013-04-13 MED ORDER — ACCU-CHEK SOFTCLIX LANCET DEV MISC: Status: DC

## 2013-04-17 NOTE — Assessment & Plan Note (Signed)
Refilled coumadin today with change in doses.

## 2013-04-17 NOTE — Assessment & Plan Note (Addendum)
Joy Butler states that she feels like she is doing better but still has times of depressed mood, sadness, and feelings of hopelessness.  She denies SI and HI.  She has been sleeping better with the addition of Celexa but still struggles to get to sleep.  We discussed that the Celexa seems to be helping some but its full effects won't be seen for about 6-8 weeks.  In the mean time she states that she lost her last prescription for Amitriptyline for her diabetic neuropathy and would like to restart that medications.  We will refill her prescription today and I encouraged her to take the medication every evening to help her rest.

## 2013-04-17 NOTE — Assessment & Plan Note (Addendum)
She states that she has been taking her insulin as prescribed.  She denies any hypoglycemia episodes and did bring her meter with her today.  I reviewed her meter with her and all of her testing was above 250 with an average of 300.  She denies polyuria, polydipsia, blurry vision, fatigue, or abdominal pain.   Lab Results  Component Value Date   HGBA1C 9.6 01/30/2013   HGBA1C 8.8 10/21/2012   HGBA1C 10.4* 08/31/2012     Assessment: Diabetes control: poor control (HgbA1C >9%) Progress toward A1C goal:  unchanged Comments: She has been taking her medications as prescribed and checking her blood sugar on average of 1-2 times per day. She states that she takes her insulin right as she is getting set to eat.   Plan: Medications:  We will increase her 70/30 insulin to 20 units BID.  I encouraged her to take it about 30 minutes prior to her meal.  Home glucose monitoring: Frequency: 3 times a day Timing: before meals Instruction/counseling given: reminded to bring blood glucose meter & log to each visit and discussed diet Educational resources provided: brochure;handout Self management tools provided:   Other plans: none

## 2013-04-17 NOTE — Assessment & Plan Note (Signed)
Joy Butler states that she continues to have pain in her feet from her neuropathy.  She misplaced her prescription for her amitriptyline.  We will restart it today and follow up at her next appointment.

## 2013-04-17 NOTE — Assessment & Plan Note (Signed)
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  She has been taking her medications as prescribed.  Lab Results  Component Value Date   INR 5.1 03/15/2013   INR 1.1 02/17/2013   Her INR today is 5.1 which is too high.  I discussed the case with Dr. Alexandria Lodge and he states to skip today's dose which she has not taken yet and restart tomorrow at 2 and 1/2 tablets daily until he follows up with her on April 7th.

## 2013-04-19 ENCOUNTER — Encounter: Payer: Self-pay | Admitting: Radiation Oncology

## 2013-04-19 ENCOUNTER — Ambulatory Visit (INDEPENDENT_AMBULATORY_CARE_PROVIDER_SITE_OTHER): Payer: Medicare Other | Admitting: Radiation Oncology

## 2013-04-19 VITALS — BP 135/84 | HR 93 | Temp 97.1°F | Ht 69.0 in | Wt 245.0 lb

## 2013-04-19 DIAGNOSIS — E1142 Type 2 diabetes mellitus with diabetic polyneuropathy: Secondary | ICD-10-CM | POA: Diagnosis not present

## 2013-04-19 DIAGNOSIS — J069 Acute upper respiratory infection, unspecified: Secondary | ICD-10-CM | POA: Diagnosis not present

## 2013-04-19 DIAGNOSIS — I10 Essential (primary) hypertension: Secondary | ICD-10-CM

## 2013-04-19 DIAGNOSIS — M549 Dorsalgia, unspecified: Secondary | ICD-10-CM | POA: Diagnosis not present

## 2013-04-19 DIAGNOSIS — E1149 Type 2 diabetes mellitus with other diabetic neurological complication: Secondary | ICD-10-CM | POA: Diagnosis not present

## 2013-04-19 DIAGNOSIS — E119 Type 2 diabetes mellitus without complications: Secondary | ICD-10-CM

## 2013-04-19 DIAGNOSIS — L0291 Cutaneous abscess, unspecified: Secondary | ICD-10-CM | POA: Diagnosis not present

## 2013-04-19 DIAGNOSIS — F17200 Nicotine dependence, unspecified, uncomplicated: Secondary | ICD-10-CM | POA: Diagnosis not present

## 2013-04-19 DIAGNOSIS — E7800 Pure hypercholesterolemia, unspecified: Secondary | ICD-10-CM | POA: Diagnosis not present

## 2013-04-19 DIAGNOSIS — Z79899 Other long term (current) drug therapy: Secondary | ICD-10-CM | POA: Diagnosis not present

## 2013-04-19 DIAGNOSIS — Z7901 Long term (current) use of anticoagulants: Secondary | ICD-10-CM | POA: Diagnosis not present

## 2013-04-19 DIAGNOSIS — G8929 Other chronic pain: Secondary | ICD-10-CM | POA: Diagnosis not present

## 2013-04-19 DIAGNOSIS — F329 Major depressive disorder, single episode, unspecified: Secondary | ICD-10-CM | POA: Diagnosis not present

## 2013-04-19 DIAGNOSIS — I2699 Other pulmonary embolism without acute cor pulmonale: Secondary | ICD-10-CM | POA: Diagnosis not present

## 2013-04-19 MED ORDER — AMITRIPTYLINE HCL 25 MG PO TABS: 25.0000 mg | ORAL_TABLET | Freq: Every day | ORAL | Status: DC

## 2013-04-19 MED ORDER — LOSARTAN POTASSIUM-HCTZ 100-25 MG PO TABS: 1.0000 | ORAL_TABLET | Freq: Every day | ORAL | Status: DC

## 2013-04-19 MED ORDER — INSULIN NPH ISOPHANE & REGULAR (70-30) 100 UNIT/ML ~~LOC~~ SUSP: 22.0000 [IU] | Freq: Two times a day (BID) | SUBCUTANEOUS | Status: DC

## 2013-04-19 NOTE — Patient Instructions (Addendum)
General instructions:   Begin taking your new dose of 70/30 insulin.   Start taking losartan-HCTZ as we discussed. Stop taking the lisinopril-HCTZ as this is the likely cause of your dry cough.   Start amitriptyline as previously prescribed.   Continue taking all of your other medications as prescribed. We will see you back in 1 month to follow-up on your medical issues. Have a great day.

## 2013-04-20 ENCOUNTER — Other Ambulatory Visit: Payer: Self-pay | Admitting: *Deleted

## 2013-04-20 DIAGNOSIS — I2699 Other pulmonary embolism without acute cor pulmonale: Secondary | ICD-10-CM | POA: Diagnosis not present

## 2013-04-20 DIAGNOSIS — Z7901 Long term (current) use of anticoagulants: Secondary | ICD-10-CM

## 2013-04-20 DIAGNOSIS — F17200 Nicotine dependence, unspecified, uncomplicated: Secondary | ICD-10-CM | POA: Diagnosis not present

## 2013-04-20 DIAGNOSIS — E1149 Type 2 diabetes mellitus with other diabetic neurological complication: Secondary | ICD-10-CM | POA: Diagnosis not present

## 2013-04-20 DIAGNOSIS — M549 Dorsalgia, unspecified: Secondary | ICD-10-CM | POA: Diagnosis not present

## 2013-04-20 DIAGNOSIS — E1142 Type 2 diabetes mellitus with diabetic polyneuropathy: Secondary | ICD-10-CM | POA: Diagnosis not present

## 2013-04-20 DIAGNOSIS — I10 Essential (primary) hypertension: Secondary | ICD-10-CM | POA: Diagnosis not present

## 2013-04-20 MED ORDER — WARFARIN SODIUM 5 MG PO TABS: ORAL_TABLET | ORAL | Status: DC

## 2013-04-20 NOTE — Progress Notes (Signed)
Case discussed with Dr. McTyre at time of visit.  We reviewed the resident's history and exam and pertinent patient test results.  I agree with the assessment, diagnosis, and plan of care documented in the resident's note. 

## 2013-04-20 NOTE — Progress Notes (Signed)
Subjective:    Patient ID: Joy Butler, female    DOB: 1951-08-20, 62 y.o.   MRN: [id number]  HPI Pt presents to clinic today for a routine visit to follow-up on her depression and diabetes mellitus. She complains of dry cough which she states has been present since 01/2013.  Depression: pt states she never began taking amitriptyline as prescribed on her previous visit as the prescription had not made it to the correct pharmacy so it could be shipped to her house. She states her depressive symptoms are unchanged over the last month.   DM: she states she has been fully compliant with her 70/30 insulin regimen.  Joy Butler requests for [employer] paperwork to be completed so that she can have services arranged for her to be picked up from her house to take her to the bus, as she is unable to walk long distances. She is currently relying on a private service for this.  Review of Systems  All other systems reviewed and are negative.   Current Outpatient Medications: Current Outpatient Prescriptions  Medication Sig Dispense Refill  . amitriptyline (ELAVIL) 25 MG tablet Take 1 tablet (25 mg total) by mouth at bedtime.  30 tablet  1  . amLODipine (NORVASC) 10 MG tablet Take 1 tablet (10 mg total) by mouth daily.  30 tablet  2  . Blood Glucose Monitoring Suppl (ACCU-CHEK AVIVA PLUS) W/DEVICE KIT 1 kit by Does not apply route 3 (three) times daily. Please check blood sugar 3 to 4 times daily. diag code 250.0. Insulin dependent  1 kit  0  . Blood Pressure KIT 1 Units by Does not apply route daily.  1 each  0  . citalopram (CELEXA) 20 MG tablet Take 1 tablet (20 mg total) by mouth daily.  30 tablet  2  . glucose blood (ACCU-CHEK AVIVA) test strip Please check blood sugar 3 to 4 times daily. diag code 250.0. Insulin dependent  100 each  12  . ibuprofen (ADVIL) 200 MG tablet Take 2 tablets (400 mg total) by mouth every 8 (eight) hours as needed for pain.  90 tablet  1  . insulin NPH-regular (NOVOLIN  70/30) (70-30) 100 UNIT/ML injection Inject 22 Units into the skin 2 (two) times daily with a meal.  10 mL  12  . Insulin Syringes, Disposable, U-100 1 ML MISC 1 Units by Does not apply route 2 (two) times daily.  100 each  11  . Lancet Devices (ACCU-CHEK SOFTCLIX) lancets Use to check blood sugars 3 to 4 times daily. Dx code: 250.00. Insulin dependent.  100 each  0  . losartan-hydrochlorothiazide (HYZAAR) 100-25 MG per tablet Take 1 tablet by mouth daily.  30 tablet  11  . nicotine (NICODERM CQ - DOSED IN MG/24 HOURS) 14 mg/24hr patch Place 1 patch onto the skin daily.  28 patch  0  . polyethylene glycol powder (MIRALAX) powder Take 17 g by mouth daily.  255 g  0  . pravastatin (PRAVACHOL) 40 MG tablet Take 1 tablet (40 mg total) by mouth every evening.  30 tablet  11  . warfarin (COUMADIN) 5 MG tablet Take 2 and a half tablets everyday except Mondays and Thursdays when you take 3 tablets  100 tablet  0   No current facility-administered medications for this visit.    Allergies: Allergies  Allergen Reactions  . Keflex (Cephalexin) Itching and Swelling    Lips and eyes swelled up  . Latex Itching  . Sulfa Antibiotics Rash  .  Sulfur Rash     Past Medical History: Past Medical History  Diagnosis Date  . Diabetes mellitus     diagnosed in 2000.    Marland Kitchen Hypertension   . Pulmonary embolism     2011 treated at [facility] in [place].  Was on Coumadin for over a year.  no known family history  . UTI (urinary tract infection)   . High cholesterol   . 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 Abdominal hysterectomy    . Cholecystectomy      1980  . Esophagogastroduodenoscopy  08/19/2012    Procedure: ESOPHAGOGASTRODUODENOSCOPY (EGD);  Surgeon: Vertell Novak., MD;  Location[facility] ENDOSCOPY;  Service: Endoscopy;  Laterality: N/A;  . Cesarean section    . Hand surgery      Family History: Family History   Problem Relation Age of Onset  . Adopted: Yes    Social History: History   Social History  . Marital Status: Single    Spouse Name: N/A    Number of Children: N/A  . Years of Education: N/A   Occupational History  . Not on file.   Social History Main Topics  . Smoking status: Current Every Day Smoker -- 0.05 packs/day for 10 years    Types: Cigarettes  . Smokeless tobacco: Never Used     Comment: Wants patches.  . Alcohol Use: No  . Drug Use: No  . Sexually Active: Not Currently   Other Topics Concern  . Not on file   Social History Narrative   Previously divorced now engaged with her partner of 4 years.  Has 6 grown children with 21 grandchildren.  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  Moved to [place] in August 2013 and was transiently homeless until first part of October 2013.       Vital Signs: Blood pressure 135/84, pulse 93, temperature 97.1 F (36.2 C), temperature source Oral, height 5\' 9"  (1.753 m), weight 245 lb (111.131 kg), SpO2 96.00%.       Objective:   Physical Exam  Constitutional: She is oriented to person, place, and time. She appears well-developed and well-nourished. No distress.  HENT:  Head: Normocephalic and atraumatic.  Eyes: Conjunctivae are normal. Pupils are equal, round, and reactive to light. No scleral icterus.  Neck: Normal range of motion. Neck supple. No tracheal deviation present.  Cardiovascular: Normal rate, regular rhythm and intact distal pulses.   Pulmonary/Chest: Effort normal and breath sounds normal. No respiratory distress. She has no wheezes.  Abdominal: Soft. Bowel sounds are normal. She exhibits no distension. There is no tenderness.  Musculoskeletal: Normal range of motion. She exhibits no edema.  Neurological: She is alert and oriented to person, place, and time. No cranial nerve deficit.  Skin: Skin is warm and dry. No erythema.  Psychiatric: She has a normal mood and  affect. Her behavior is normal.          Assessment & Plan:

## 2013-04-20 NOTE — Assessment & Plan Note (Signed)
Lab Results  Component Value Date   HGBA1C 10.3 04/19/2013   HGBA1C 9.6 01/30/2013   HGBA1C 8.8 10/21/2012     Assessment: Diabetes control: poor control (HgbA1C >9%) Progress toward A1C goal:  unchanged Comments: pt checking CBGs irregularly, however average CBG >200, with no evidence of symptomatic or asymptomatic hypoglycemia.   Plan: Medications:  Increase 70/30 insulin from 20U bid => 22U bid Home glucose monitoring: Frequency:   Timing:   Instruction/counseling given: reminded to bring blood glucose meter & log to each visit Educational resources provided: brochure Self management tools provided: copy of home glucose meter download Other plans:

## 2013-04-20 NOTE — Assessment & Plan Note (Addendum)
BP Readings from Last 3 Encounters:  04/19/13 135/84  03/15/13 118/76  03/01/13 102/66    Lab Results  Component Value Date   NA 141 01/30/2013   K 3.8 01/30/2013   CREATININE 0.93 01/30/2013    Assessment: Blood pressure control: controlled Progress toward BP goal:  at goal Comments:   Plan: Medications:  Will discontinue lisinopril as it appears the likely cause of her dry cough (pt states cough began in 01/2013 which coincides with lisinopril being restarted on 01/30/2013). Restart losartan-HCTZ 100-25mg  daily.  Educational resources provided: brochure Self management tools provided: home blood pressure logbook Other plans:

## 2013-04-27 DIAGNOSIS — F17200 Nicotine dependence, unspecified, uncomplicated: Secondary | ICD-10-CM | POA: Diagnosis not present

## 2013-04-27 DIAGNOSIS — I10 Essential (primary) hypertension: Secondary | ICD-10-CM | POA: Diagnosis not present

## 2013-04-27 DIAGNOSIS — I2699 Other pulmonary embolism without acute cor pulmonale: Secondary | ICD-10-CM | POA: Diagnosis not present

## 2013-04-27 DIAGNOSIS — E1142 Type 2 diabetes mellitus with diabetic polyneuropathy: Secondary | ICD-10-CM | POA: Diagnosis not present

## 2013-04-27 DIAGNOSIS — M549 Dorsalgia, unspecified: Secondary | ICD-10-CM | POA: Diagnosis not present

## 2013-04-27 DIAGNOSIS — E1149 Type 2 diabetes mellitus with other diabetic neurological complication: Secondary | ICD-10-CM | POA: Diagnosis not present

## 2013-05-02 DIAGNOSIS — E1142 Type 2 diabetes mellitus with diabetic polyneuropathy: Secondary | ICD-10-CM | POA: Diagnosis not present

## 2013-05-02 DIAGNOSIS — E1149 Type 2 diabetes mellitus with other diabetic neurological complication: Secondary | ICD-10-CM | POA: Diagnosis not present

## 2013-05-02 DIAGNOSIS — F17200 Nicotine dependence, unspecified, uncomplicated: Secondary | ICD-10-CM | POA: Diagnosis not present

## 2013-05-02 DIAGNOSIS — I2699 Other pulmonary embolism without acute cor pulmonale: Secondary | ICD-10-CM | POA: Diagnosis not present

## 2013-05-02 DIAGNOSIS — I10 Essential (primary) hypertension: Secondary | ICD-10-CM | POA: Diagnosis not present

## 2013-05-02 DIAGNOSIS — M549 Dorsalgia, unspecified: Secondary | ICD-10-CM | POA: Diagnosis not present

## 2013-05-08 ENCOUNTER — Ambulatory Visit (INDEPENDENT_AMBULATORY_CARE_PROVIDER_SITE_OTHER): Payer: Medicare Other | Admitting: Pharmacist

## 2013-05-08 DIAGNOSIS — I2699 Other pulmonary embolism without acute cor pulmonale: Secondary | ICD-10-CM | POA: Diagnosis not present

## 2013-05-08 DIAGNOSIS — I10 Essential (primary) hypertension: Secondary | ICD-10-CM | POA: Diagnosis not present

## 2013-05-08 DIAGNOSIS — F17200 Nicotine dependence, unspecified, uncomplicated: Secondary | ICD-10-CM | POA: Diagnosis not present

## 2013-05-08 DIAGNOSIS — Z79899 Other long term (current) drug therapy: Secondary | ICD-10-CM | POA: Diagnosis not present

## 2013-05-08 DIAGNOSIS — J069 Acute upper respiratory infection, unspecified: Secondary | ICD-10-CM | POA: Diagnosis not present

## 2013-05-08 DIAGNOSIS — L0291 Cutaneous abscess, unspecified: Secondary | ICD-10-CM | POA: Diagnosis not present

## 2013-05-08 DIAGNOSIS — F329 Major depressive disorder, single episode, unspecified: Secondary | ICD-10-CM | POA: Diagnosis not present

## 2013-05-08 DIAGNOSIS — G8929 Other chronic pain: Secondary | ICD-10-CM | POA: Diagnosis not present

## 2013-05-08 DIAGNOSIS — Z7901 Long term (current) use of anticoagulants: Secondary | ICD-10-CM | POA: Diagnosis not present

## 2013-05-08 DIAGNOSIS — E1142 Type 2 diabetes mellitus with diabetic polyneuropathy: Secondary | ICD-10-CM | POA: Diagnosis not present

## 2013-05-08 DIAGNOSIS — E1149 Type 2 diabetes mellitus with other diabetic neurological complication: Secondary | ICD-10-CM | POA: Diagnosis not present

## 2013-05-08 DIAGNOSIS — E119 Type 2 diabetes mellitus without complications: Secondary | ICD-10-CM | POA: Diagnosis not present

## 2013-05-08 DIAGNOSIS — E7800 Pure hypercholesterolemia, unspecified: Secondary | ICD-10-CM | POA: Diagnosis not present

## 2013-05-08 DIAGNOSIS — M549 Dorsalgia, unspecified: Secondary | ICD-10-CM | POA: Diagnosis not present

## 2013-05-08 NOTE — Progress Notes (Signed)
Anti-Coagulation Progress Note  Joy Butler is a 62 y.o. female who is currently on an anti-coagulation regimen.    RECENT RESULTS: Recent results are below, the most recent result is correlated with a dose of 92.5 mg. per week: Lab Results  Component Value Date   INR 2.20 05/08/2013   INR 1.0 03/27/2013   INR 5.1 03/15/2013    ANTI-COAG DOSE: Anticoagulation Dose Instructions as of 05/08/2013     [name] Tue Wed Thu Fri Sat   New Dose 12.5 mg 15 mg 12.5 mg 12.5 mg 15 mg 12.5 mg 12.5 mg       ANTICOAG SUMMARY: Anticoagulation Episode Summary   Current INR goal 2.0-3.0  Next INR check 05/29/2013  INR from last check 2.20 (05/08/2013)  Weekly max dose   Target end date Indefinite  INR check location   Preferred lab   Send INR reminders to    Indications  Personal history of PE (pulmonary embolism) (Resolved) [V12.55] 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 Duration of therapy should be indefinite but with periodic assessment and advice and consent of the patient insofar as continuing indefiite warfarin therapy as per guidelines published in supplement to Journal CHEST.        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 Duration of therapy should be indefinite but with periodic assessment and advice and consent of the patient insofar as continuing indefiite warfarin therapy as per guidelines published in supplement  to Journal CHEST.      PATIENT INSTRUCTIONS: Patient Instructions  Patient instructed to take medications as defined in the Anti-coagulation Track section of this encounter.  Patient instructed to take today's dose.  Patient verbalized understanding of these instructions.       FOLLOW-UP Return in 3 weeks (on 05/29/2013) for Follow up INR at 2PM.  Hulen Luster, III Pharm.D., CACP

## 2013-05-08 NOTE — Patient Instructions (Signed)
Patient instructed to take medications as defined in the Anti-coagulation Track section of this encounter.  Patient instructed to take today's dose.  Patient verbalized understanding of these instructions.    

## 2013-05-09 ENCOUNTER — Other Ambulatory Visit: Payer: Self-pay | Admitting: Radiation Oncology

## 2013-05-10 ENCOUNTER — Other Ambulatory Visit: Payer: Self-pay | Admitting: *Deleted

## 2013-05-10 DIAGNOSIS — E1142 Type 2 diabetes mellitus with diabetic polyneuropathy: Secondary | ICD-10-CM

## 2013-05-10 DIAGNOSIS — F329 Major depressive disorder, single episode, unspecified: Secondary | ICD-10-CM

## 2013-05-10 DIAGNOSIS — E119 Type 2 diabetes mellitus without complications: Secondary | ICD-10-CM

## 2013-05-10 DIAGNOSIS — F32A Depression, unspecified: Secondary | ICD-10-CM

## 2013-05-10 MED ORDER — ACCU-CHEK SOFTCLIX LANCET DEV MISC: Status: DC

## 2013-05-10 MED ORDER — CITALOPRAM HYDROBROMIDE 20 MG PO TABS: 20.0000 mg | ORAL_TABLET | Freq: Every day | ORAL | Status: DC

## 2013-05-29 ENCOUNTER — Ambulatory Visit: Payer: Medicare Other

## 2013-05-30 ENCOUNTER — Other Ambulatory Visit: Payer: Self-pay | Admitting: Radiation Oncology

## 2013-05-30 DIAGNOSIS — I2699 Other pulmonary embolism without acute cor pulmonale: Secondary | ICD-10-CM

## 2013-05-30 DIAGNOSIS — Z7901 Long term (current) use of anticoagulants: Secondary | ICD-10-CM

## 2013-05-31 MED ORDER — WARFARIN SODIUM 5 MG PO TABS: ORAL_TABLET | ORAL | Status: DC

## 2013-06-06 ENCOUNTER — Other Ambulatory Visit: Payer: Self-pay | Admitting: *Deleted

## 2013-06-06 DIAGNOSIS — I10 Essential (primary) hypertension: Secondary | ICD-10-CM

## 2013-06-06 MED ORDER — AMLODIPINE BESYLATE 10 MG PO TABS: 10.0000 mg | ORAL_TABLET | Freq: Every day | ORAL | Status: DC

## 2013-06-12 ENCOUNTER — Ambulatory Visit (INDEPENDENT_AMBULATORY_CARE_PROVIDER_SITE_OTHER): Payer: Medicare Other | Admitting: Pharmacist

## 2013-06-12 DIAGNOSIS — I2699 Other pulmonary embolism without acute cor pulmonale: Secondary | ICD-10-CM | POA: Diagnosis not present

## 2013-06-12 DIAGNOSIS — Z79899 Other long term (current) drug therapy: Secondary | ICD-10-CM | POA: Diagnosis not present

## 2013-06-12 DIAGNOSIS — M549 Dorsalgia, unspecified: Secondary | ICD-10-CM | POA: Diagnosis not present

## 2013-06-12 DIAGNOSIS — F17200 Nicotine dependence, unspecified, uncomplicated: Secondary | ICD-10-CM | POA: Diagnosis not present

## 2013-06-12 DIAGNOSIS — E1142 Type 2 diabetes mellitus with diabetic polyneuropathy: Secondary | ICD-10-CM | POA: Diagnosis not present

## 2013-06-12 DIAGNOSIS — J069 Acute upper respiratory infection, unspecified: Secondary | ICD-10-CM | POA: Diagnosis not present

## 2013-06-12 DIAGNOSIS — L0291 Cutaneous abscess, unspecified: Secondary | ICD-10-CM | POA: Diagnosis not present

## 2013-06-12 DIAGNOSIS — E1149 Type 2 diabetes mellitus with other diabetic neurological complication: Secondary | ICD-10-CM | POA: Diagnosis not present

## 2013-06-12 DIAGNOSIS — G8929 Other chronic pain: Secondary | ICD-10-CM | POA: Diagnosis not present

## 2013-06-12 DIAGNOSIS — E7800 Pure hypercholesterolemia, unspecified: Secondary | ICD-10-CM | POA: Diagnosis not present

## 2013-06-12 DIAGNOSIS — E119 Type 2 diabetes mellitus without complications: Secondary | ICD-10-CM | POA: Diagnosis not present

## 2013-06-12 DIAGNOSIS — F329 Major depressive disorder, single episode, unspecified: Secondary | ICD-10-CM | POA: Diagnosis not present

## 2013-06-12 DIAGNOSIS — Z7901 Long term (current) use of anticoagulants: Secondary | ICD-10-CM

## 2013-06-12 DIAGNOSIS — I10 Essential (primary) hypertension: Secondary | ICD-10-CM | POA: Diagnosis not present

## 2013-06-12 NOTE — Progress Notes (Signed)
Anti-Coagulation Progress Note  Joy Butler is a 62 y.o. female who is currently on an anti-coagulation regimen.    RECENT RESULTS: Recent results are below, the most recent result is correlated with a dose of 92.5 mg. per week: Lab Results  Component Value Date   INR 1.60 06/12/2013   INR 2.20 05/08/2013   INR 1.0 03/27/2013    ANTI-COAG DOSE: Anticoagulation Dose Instructions as of 06/12/2013     [name] Tue Wed Thu Fri Sat   New Dose 12.5 mg 15 mg 15 mg 12.5 mg 15 mg 15 mg 12.5 mg       ANTICOAG SUMMARY: Anticoagulation Episode Summary   Current INR goal 2.0-3.0  Next INR check 06/26/2013  INR from last check 1.60! 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 Duration of therapy should be indefinite but with periodic assessment and advice and consent of the patient insofar as continuing indefiite warfarin therapy as per guidelines published in supplement to Journal CHEST.        ANTICOAG TODAY: Anticoagulation Summary as of 06/12/2013   INR goal 2.0-3.0  Selected INR 1.60! 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 Duration of therapy should be indefinite but with periodic assessment and advice and consent of the patient insofar as continuing indefiite warfarin therapy as per guidelines published in supplement to  Journal CHEST.      PATIENT INSTRUCTIONS: Patient Instructions  Patient instructed to take medications as defined in the Anti-coagulation Track section of this encounter.  Patient instructed to take today's dose.  Patient verbalized understanding of these instructions.       FOLLOW-UP Return in 2 weeks (on 06/26/2013) for Follow up INR at 1100h.  Hulen Luster, III Pharm.D., CACP

## 2013-06-12 NOTE — Patient Instructions (Signed)
Patient instructed to take medications as defined in the Anti-coagulation Track section of this encounter.  Patient instructed to take today's dose.  Patient verbalized understanding of these instructions.    

## 2013-06-22 ENCOUNTER — Other Ambulatory Visit: Payer: Self-pay | Admitting: Internal Medicine

## 2013-06-26 ENCOUNTER — Other Ambulatory Visit: Payer: Self-pay | Admitting: Internal Medicine

## 2013-06-26 ENCOUNTER — Ambulatory Visit (INDEPENDENT_AMBULATORY_CARE_PROVIDER_SITE_OTHER): Payer: Medicare Other | Admitting: Pharmacist

## 2013-06-26 DIAGNOSIS — L0291 Cutaneous abscess, unspecified: Secondary | ICD-10-CM | POA: Diagnosis not present

## 2013-06-26 DIAGNOSIS — I2699 Other pulmonary embolism without acute cor pulmonale: Secondary | ICD-10-CM

## 2013-06-26 DIAGNOSIS — Z7901 Long term (current) use of anticoagulants: Secondary | ICD-10-CM

## 2013-06-26 DIAGNOSIS — E1142 Type 2 diabetes mellitus with diabetic polyneuropathy: Secondary | ICD-10-CM | POA: Diagnosis not present

## 2013-06-26 DIAGNOSIS — F17200 Nicotine dependence, unspecified, uncomplicated: Secondary | ICD-10-CM | POA: Diagnosis not present

## 2013-06-26 DIAGNOSIS — I10 Essential (primary) hypertension: Secondary | ICD-10-CM | POA: Diagnosis not present

## 2013-06-26 DIAGNOSIS — E7800 Pure hypercholesterolemia, unspecified: Secondary | ICD-10-CM | POA: Diagnosis not present

## 2013-06-26 DIAGNOSIS — Z79899 Other long term (current) drug therapy: Secondary | ICD-10-CM | POA: Diagnosis not present

## 2013-06-26 DIAGNOSIS — F329 Major depressive disorder, single episode, unspecified: Secondary | ICD-10-CM | POA: Diagnosis not present

## 2013-06-26 DIAGNOSIS — E119 Type 2 diabetes mellitus without complications: Secondary | ICD-10-CM | POA: Diagnosis not present

## 2013-06-26 DIAGNOSIS — E1149 Type 2 diabetes mellitus with other diabetic neurological complication: Secondary | ICD-10-CM | POA: Diagnosis not present

## 2013-06-26 DIAGNOSIS — M549 Dorsalgia, unspecified: Secondary | ICD-10-CM | POA: Diagnosis not present

## 2013-06-26 DIAGNOSIS — J069 Acute upper respiratory infection, unspecified: Secondary | ICD-10-CM | POA: Diagnosis not present

## 2013-06-26 DIAGNOSIS — G8929 Other chronic pain: Secondary | ICD-10-CM | POA: Diagnosis not present

## 2013-06-26 LAB — POCT INR: INR: 2.3

## 2013-06-26 NOTE — Patient Instructions (Signed)
Patient instructed to take medications as defined in the Anti-coagulation Track section of this encounter.  Patient instructed to take today's dose.  Patient verbalized understanding of these instructions.    

## 2013-06-26 NOTE — Progress Notes (Signed)
Anti-Coagulation Progress Note  Joy Butler is a 62 y.o. female who is currently on an anti-coagulation regimen.    RECENT RESULTS: Recent results are below, the most recent result is correlated with a dose of 97.5 mg. per week: Lab Results  Component Value Date   INR 2.30 06/26/2013   INR 1.60 06/12/2013   INR 2.20 05/08/2013    ANTI-COAG DOSE: Anticoagulation Dose Instructions as of 06/26/2013     [name] Tue Wed Thu Fri Sat   New Dose 12.5 mg 15 mg 15 mg 12.5 mg 15 mg 15 mg 12.5 mg       ANTICOAG SUMMARY: Anticoagulation Episode Summary   Current INR goal 2.0-3.0  Next INR check 07/17/2013  INR from last check 2.30 (06/26/2013)  Weekly max dose   Target end date Indefinite  INR check location   Preferred lab   Send INR reminders to    Indications  Personal history of PE (pulmonary embolism) (Resolved) [V12.55] 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 Duration of therapy should be indefinite but with periodic assessment and advice and consent of the patient insofar as continuing indefiite warfarin therapy as per guidelines published in supplement to Journal CHEST.        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 Duration of therapy should be indefinite but with periodic assessment and advice and consent of the patient insofar as continuing indefiite warfarin therapy as per guidelines published in supplement to  Journal CHEST.      PATIENT INSTRUCTIONS: Patient Instructions  Patient instructed to take medications as defined in the Anti-coagulation Track section of this encounter.  Patient instructed to take today's dose.  Patient verbalized understanding of these instructions.       FOLLOW-UP Return in 3 weeks (on 07/17/2013) for Follow up INR at 1100h.  Hulen Luster, III Pharm.D., CACP

## 2013-06-28 ENCOUNTER — Ambulatory Visit (INDEPENDENT_AMBULATORY_CARE_PROVIDER_SITE_OTHER): Payer: Medicare Other | Admitting: Internal Medicine

## 2013-06-28 ENCOUNTER — Encounter: Payer: Self-pay | Admitting: Internal Medicine

## 2013-06-28 VITALS — BP 127/79 | HR 96 | Temp 97.3°F | Ht 69.0 in | Wt 245.0 lb

## 2013-06-28 DIAGNOSIS — E7800 Pure hypercholesterolemia, unspecified: Secondary | ICD-10-CM | POA: Diagnosis not present

## 2013-06-28 DIAGNOSIS — L0291 Cutaneous abscess, unspecified: Secondary | ICD-10-CM | POA: Diagnosis not present

## 2013-06-28 DIAGNOSIS — M549 Dorsalgia, unspecified: Secondary | ICD-10-CM

## 2013-06-28 DIAGNOSIS — F17200 Nicotine dependence, unspecified, uncomplicated: Secondary | ICD-10-CM | POA: Diagnosis not present

## 2013-06-28 DIAGNOSIS — E1149 Type 2 diabetes mellitus with other diabetic neurological complication: Secondary | ICD-10-CM | POA: Diagnosis not present

## 2013-06-28 DIAGNOSIS — I2699 Other pulmonary embolism without acute cor pulmonale: Secondary | ICD-10-CM | POA: Diagnosis not present

## 2013-06-28 DIAGNOSIS — I10 Essential (primary) hypertension: Secondary | ICD-10-CM

## 2013-06-28 DIAGNOSIS — E1142 Type 2 diabetes mellitus with diabetic polyneuropathy: Secondary | ICD-10-CM | POA: Diagnosis not present

## 2013-06-28 DIAGNOSIS — E119 Type 2 diabetes mellitus without complications: Secondary | ICD-10-CM | POA: Diagnosis not present

## 2013-06-28 DIAGNOSIS — J069 Acute upper respiratory infection, unspecified: Secondary | ICD-10-CM | POA: Diagnosis not present

## 2013-06-28 DIAGNOSIS — Z79899 Other long term (current) drug therapy: Secondary | ICD-10-CM | POA: Diagnosis not present

## 2013-06-28 DIAGNOSIS — Z7901 Long term (current) use of anticoagulants: Secondary | ICD-10-CM | POA: Diagnosis not present

## 2013-06-28 DIAGNOSIS — F329 Major depressive disorder, single episode, unspecified: Secondary | ICD-10-CM | POA: Diagnosis not present

## 2013-06-28 DIAGNOSIS — G8929 Other chronic pain: Secondary | ICD-10-CM | POA: Diagnosis not present

## 2013-06-28 DIAGNOSIS — F17210 Nicotine dependence, cigarettes, uncomplicated: Secondary | ICD-10-CM

## 2013-06-28 LAB — GLUCOSE, CAPILLARY: Glucose-Capillary: 241 mg/dL — ABNORMAL HIGH (ref 70–99)

## 2013-06-28 MED ORDER — OMEPRAZOLE 20 MG PO CPDR: 20.0000 mg | DELAYED_RELEASE_CAPSULE | Freq: Every day | ORAL | Status: DC

## 2013-06-28 MED ORDER — IBUPROFEN 200 MG PO TABS: 800.0000 mg | ORAL_TABLET | ORAL | Status: DC

## 2013-06-28 MED ORDER — INSULIN NPH ISOPHANE & REGULAR (70-30) 100 UNIT/ML ~~LOC~~ SUSP: 23.0000 [IU] | Freq: Two times a day (BID) | SUBCUTANEOUS | Status: DC

## 2013-06-28 MED ORDER — AMITRIPTYLINE HCL 25 MG PO TABS: 50.0000 mg | ORAL_TABLET | Freq: Every day | ORAL | Status: DC

## 2013-06-28 NOTE — Assessment & Plan Note (Signed)
Continue with amitriptyline 50 mg at bedtime.

## 2013-06-28 NOTE — Assessment & Plan Note (Signed)
BP Readings from Last 3 Encounters:  06/28/13 127/79  04/19/13 135/84  03/15/13 118/76    Lab Results  Component Value Date   NA 141 01/30/2013   K 3.8 01/30/2013   CREATININE 0.93 01/30/2013    Assessment: Blood pressure control: controlled Progress toward BP goal:    Comments:   Plan: Medications:  continue current medications Educational resources provided:   Self management tools provided:   Other plans:

## 2013-06-28 NOTE — Patient Instructions (Signed)
General Instructions: Increase Insulin to 23 units twice daily.  Please take Omeprazole 20 mg once daily I will see you in 1 month   Treatment Goals:  Goals (1 Years of Data) as of 06/28/13         As of Today 04/19/13 03/15/13 03/01/13 02/17/13     Blood Pressure    . Blood Pressure < 120/80  127/79 135/84 118/76 102/66 135/82     Result Component    . HEMOGLOBIN A1C < 7.0   10.3       . LDL CALC < 70            Progress Toward Treatment Goals:  Treatment Goal 06/28/2013  Hemoglobin A1C -  Blood pressure -  Stop smoking smoking the same amount  Prevent falls unchanged    Self Care Goals & Plans:  Self Care Goal 06/28/2013  Manage my medications take my medicines as prescribed  Monitor my health bring my glucose meter and log to each visit  Eat healthy foods -  Be physically active -  Stop smoking -    Home Blood Glucose Monitoring 06/28/2013  Check my blood sugar 3 times a day  When to check my blood sugar -     Care Management & Community Referrals:  Referral 02/17/2013  Referrals made for care management support [facility] case management program

## 2013-06-28 NOTE — Assessment & Plan Note (Signed)
Patient reports, that she's been taking Motrin 800 mg every morning as needed. She reports that this controls her back pain. She also reports that her previous prescriptions, naproxen, and tramadol have caused her side effects with gastric irritation, and she would like to continue with the Motrin. Plan. - Motrin 800 mg every morning as needed for pain. -Omeprazole 20 mg for GI prophylaxis.

## 2013-06-28 NOTE — Progress Notes (Signed)
Subjective:   Patient ID: TALLI KIMMER female   DOB: May 22, 1951 62 y.o.   MRN: [id number]  HPI: Ms.Xara R Joynt is a 62 y.o. with past medical history of type 2 diabetes, hypertension, history of pulmonary embolism on chronic Coumadin therapy, hyperlipidemia, and peripheral neuropathy, presents to the clinic for followup visit.  She continues to complain of pain in her lower extremities. She feels the pain is relieved by 50 mg of amitriptyline at bedtime. She also requests a prescription of Motrin 800 mg daily, which she has discovered to control her back pain better. No new complaints.  She brings her glucose meter.  Please see the A&P for the status of the pt's chronic medical problems.   Past Medical History  Diagnosis Date  . Diabetes mellitus     diagnosed in 2000.    Marland Kitchen Hypertension   . Pulmonary embolism     2011 treated at [facility] in [place].  Was on Coumadin for over a year.  no known family history  . UTI (urinary tract infection)   . High cholesterol   . 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 amitriptyline (ELAVIL) 25 MG tablet Take 2 tablets (50 mg total) by mouth at bedtime.  30 tablet  1  . amLODipine (NORVASC) 10 MG tablet Take 1 tablet (10 mg total) by mouth daily.  30 tablet  2  . Blood Glucose Monitoring Suppl (ACCU-CHEK AVIVA PLUS) W/DEVICE KIT 1 kit by Does not apply route 3 (three) times daily. Please check blood sugar 3 to 4 times daily. diag code 250.0. Insulin dependent  1 kit  0  . Blood Pressure KIT 1 Units by Does not apply route daily.  1 each  0  . citalopram (CELEXA) 20 MG tablet Take 1 tablet (20 mg total) by mouth daily.  30 tablet  2  . glucose blood (ACCU-CHEK AVIVA) test strip Please check blood sugar 3 to 4 times daily. diag code 250.0. Insulin dependent  100 each  12  . insulin NPH-regular (NOVOLIN 70/30) (70-30) 100 UNIT/ML injection Inject 23 Units into the skin 2  (two) times daily with a meal.  10 mL  12  . Insulin Syringes, Disposable, U-100 1 ML MISC 1 Units by Does not apply route 2 (two) times daily.  100 each  11  . Lancet Devices (ACCU-CHEK SOFTCLIX) lancets Use to check blood sugars 3 to 4 times daily. Dx code: 250.00. Insulin dependent.  100 each  0  . losartan-hydrochlorothiazide (HYZAAR) 100-25 MG per tablet Take 1 tablet by mouth daily.  30 tablet  11  . pravastatin (PRAVACHOL) 40 MG tablet Take 1 tablet (40 mg total) by mouth every evening.  30 tablet  11  . warfarin (COUMADIN) 5 MG tablet TAKE 2 1/2 TABLETS BY MOUTH DAILY EXCEPT MONDAYS AND THURSDAYS WHEN YOU TAKE 3 TABLETS BY MOUTH DAILY.  100 tablet  0  . ibuprofen (MOTRIN IB) 200 MG tablet Take 4 tablets (800 mg total) by mouth every morning.  100 tablet  2  . omeprazole (PRILOSEC) 20 MG capsule Take 1 capsule (20 mg total) by mouth daily.  30 capsule  3   No current facility-administered medications for this visit.   Family History  Problem Relation Age of Onset  . Adopted: Yes   History   Social History  . Marital Status: Single    Spouse Name: N/A  Number of Children: N/A  . Years of Education: N/A   Social History Main Topics  . Smoking status: Current Every Day Smoker -- 0.05 packs/day for 10 years    Types: Cigarettes  . Smokeless tobacco: Never Used     Comment: Wants patches.  . Alcohol Use: No  . Drug Use: No  . Sexually Active: Not Currently   Other Topics Concern  . None   Social History Narrative   Previously divorced now engaged with her partner of 4 years.  Has 6 grown children with 21 grandchildren.  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  Moved to [place] in August 2013 and was transiently homeless until first part of October 2013.      Review of Systems: Constitutional: She reports fatigue. She denies fevers or chills. Respiratory: Denies SOB, DOE, cough, chest tightness, and wheezing.  Cardiovascular:  Denies chest pain, palpitations and leg swelling.  Gastrointestinal: Denies nausea, vomiting, abdominal pain, diarrhea, constipation,blood in stool and abdominal distention. She is now having regular bowel movements.   Skin: Denies pallor, rash and wound.  Neurological: Denies dizziness, seizures, syncope, weakness, lightheadedness, numbness and headaches. Psychiatric: She feels that her symptoms of depression have significantly improved. She recently moved into a new house, which she feels has helped with her mood.  Objective:  Physical Exam: Filed Vitals:   06/28/13 1612  BP: 127/79  Pulse: 96  Temp: 97.3 F (36.3 C)  TempSrc: Oral  Height: 5\' 9"  (1.753 m)  Weight: 245 lb (111.131 kg)  SpO2: 96%   General: Vital signs reviewed. In no acute distress.  Lungs: Clear to auscultation bilaterally  Heart: Regular; no extra sounds or murmurs  Abdomen: Bowel sounds present, soft, nontender; no hepatosplenomegaly  Extremities: No pedal edema Skin: No rash or wounds  Neurologic: Alert and oriented x3.  Assessment & Plan:  I have discussed my assessment and plan for the care of [name] with Dr. Dalphine Handing as detailed in my problem based charting.

## 2013-06-28 NOTE — Assessment & Plan Note (Signed)
Lab Results  Component Value Date   HGBA1C 10.3 04/19/2013   HGBA1C 9.6 01/30/2013   HGBA1C 8.8 10/21/2012     Assessment: Diabetes control: poor control (HgbA1C >9%) Progress toward A1C goal:    Comments: A few hypoglycemic episodes in the morning hours as per glucose meter.  Plan: Medications:  Increased her insulin dose to 23 units twice a day. Home glucose monitoring: Frequency: 3 times a day Timing:   Instruction/counseling given: reminded to bring blood glucose meter & log to each visit and reminded to bring medications to each visit Educational resources provided:   Self management tools provided: copy of home glucose meter download Other plans: Counseled the patient about the importance of having a bedtime snack and regular meals to avoid hypoglycemia.

## 2013-06-29 ENCOUNTER — Other Ambulatory Visit: Payer: Self-pay

## 2013-07-03 NOTE — Progress Notes (Signed)
Case discussed with Dr.Kazibwe at the time of the visit.  We reviewed the resident's history and exam and pertinent patient test results.  I agree with the assessment, diagnosis, and plan of care documented in the resident's note.    

## 2013-07-17 ENCOUNTER — Ambulatory Visit (INDEPENDENT_AMBULATORY_CARE_PROVIDER_SITE_OTHER): Payer: Medicare Other | Admitting: Pharmacist

## 2013-07-17 DIAGNOSIS — E1142 Type 2 diabetes mellitus with diabetic polyneuropathy: Secondary | ICD-10-CM | POA: Diagnosis not present

## 2013-07-17 DIAGNOSIS — J069 Acute upper respiratory infection, unspecified: Secondary | ICD-10-CM | POA: Diagnosis not present

## 2013-07-17 DIAGNOSIS — L0291 Cutaneous abscess, unspecified: Secondary | ICD-10-CM | POA: Diagnosis not present

## 2013-07-17 DIAGNOSIS — Z7901 Long term (current) use of anticoagulants: Secondary | ICD-10-CM

## 2013-07-17 DIAGNOSIS — E7800 Pure hypercholesterolemia, unspecified: Secondary | ICD-10-CM | POA: Diagnosis not present

## 2013-07-17 DIAGNOSIS — E1149 Type 2 diabetes mellitus with other diabetic neurological complication: Secondary | ICD-10-CM | POA: Diagnosis not present

## 2013-07-17 DIAGNOSIS — Z79899 Other long term (current) drug therapy: Secondary | ICD-10-CM | POA: Diagnosis not present

## 2013-07-17 DIAGNOSIS — G8929 Other chronic pain: Secondary | ICD-10-CM | POA: Diagnosis not present

## 2013-07-17 DIAGNOSIS — F329 Major depressive disorder, single episode, unspecified: Secondary | ICD-10-CM | POA: Diagnosis not present

## 2013-07-17 DIAGNOSIS — M549 Dorsalgia, unspecified: Secondary | ICD-10-CM | POA: Diagnosis not present

## 2013-07-17 DIAGNOSIS — I2699 Other pulmonary embolism without acute cor pulmonale: Secondary | ICD-10-CM | POA: Diagnosis not present

## 2013-07-17 DIAGNOSIS — F17200 Nicotine dependence, unspecified, uncomplicated: Secondary | ICD-10-CM | POA: Diagnosis not present

## 2013-07-17 DIAGNOSIS — E119 Type 2 diabetes mellitus without complications: Secondary | ICD-10-CM | POA: Diagnosis not present

## 2013-07-17 DIAGNOSIS — I10 Essential (primary) hypertension: Secondary | ICD-10-CM | POA: Diagnosis not present

## 2013-07-17 NOTE — Patient Instructions (Signed)
Patient instructed to take medications as defined in the Anti-coagulation Track section of this encounter.  Patient instructed to OMIT/HOLD today's dose.  Patient verbalized understanding of these instructions.    

## 2013-07-17 NOTE — Progress Notes (Signed)
Anti-Coagulation Progress Note  Joy Butler is a 62 y.o. female who is currently on an anti-coagulation regimen.    RECENT RESULTS: Recent results are below, the most recent result is correlated with a dose of 97.5 mg. per week: Lab Results  Component Value Date   INR 4.30 07/17/2013   INR 2.30 06/26/2013   INR 1.60 06/12/2013    ANTI-COAG DOSE: Anticoagulation Dose Instructions as of 07/17/2013     [name] Tue Wed Thu Fri Sat   New Dose 12.5 mg 12.5 mg 12.5 mg 12.5 mg 15 mg 15 mg 12.5 mg       ANTICOAG SUMMARY: Anticoagulation Episode Summary   Current INR goal 2.0-3.0  Next INR check 07/24/2013  INR from last check 4.30! 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 Duration of therapy should be indefinite but with periodic assessment and advice and consent of the patient insofar as continuing indefiite warfarin therapy as per guidelines published in supplement to Journal CHEST.        ANTICOAG TODAY: Anticoagulation Summary as of 07/17/2013   INR goal 2.0-3.0  Selected INR 4.30! 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 Duration of therapy should be indefinite but with periodic assessment and advice and consent of the patient insofar as continuing indefiite warfarin therapy as per guidelines published in  supplement to Journal CHEST.      PATIENT INSTRUCTIONS: Patient Instructions  Patient instructed to take medications as defined in the Anti-coagulation Track section of this encounter.  Patient instructed to OMIT/HOLD today's dose.  Patient verbalized understanding of these instructions.       FOLLOW-UP Return in 7 days (on 07/24/2013) for Follow up INR at 1115h.  Hulen Luster, III Pharm.D., CACP

## 2013-07-20 ENCOUNTER — Other Ambulatory Visit: Payer: Self-pay | Admitting: *Deleted

## 2013-07-20 MED ORDER — IBUPROFEN 800 MG PO TABS: 800.0000 mg | ORAL_TABLET | Freq: Every day | ORAL | Status: DC | PRN

## 2013-07-24 ENCOUNTER — Ambulatory Visit: Payer: Medicare Other

## 2013-07-25 ENCOUNTER — Other Ambulatory Visit: Payer: Self-pay | Admitting: *Deleted

## 2013-07-25 DIAGNOSIS — E1142 Type 2 diabetes mellitus with diabetic polyneuropathy: Secondary | ICD-10-CM

## 2013-07-25 MED ORDER — AMITRIPTYLINE HCL 25 MG PO TABS: 50.0000 mg | ORAL_TABLET | Freq: Every day | ORAL | Status: DC

## 2013-07-25 NOTE — Telephone Encounter (Signed)
Pt will need # 60 a month.  She is out of meds as she was only given # 30 Please change amount.

## 2013-08-07 ENCOUNTER — Other Ambulatory Visit: Payer: Self-pay | Admitting: Internal Medicine

## 2013-08-14 ENCOUNTER — Ambulatory Visit: Payer: Medicare Other

## 2013-08-14 ENCOUNTER — Encounter: Payer: Self-pay | Admitting: Internal Medicine

## 2013-08-14 ENCOUNTER — Ambulatory Visit (INDEPENDENT_AMBULATORY_CARE_PROVIDER_SITE_OTHER): Payer: Medicare Other | Admitting: Internal Medicine

## 2013-08-14 VITALS — BP 143/84 | HR 86 | Temp 97.0°F | Ht 69.0 in | Wt 246.3 lb

## 2013-08-14 DIAGNOSIS — E1149 Type 2 diabetes mellitus with other diabetic neurological complication: Secondary | ICD-10-CM

## 2013-08-14 DIAGNOSIS — Z79899 Other long term (current) drug therapy: Secondary | ICD-10-CM | POA: Diagnosis not present

## 2013-08-14 DIAGNOSIS — G8929 Other chronic pain: Secondary | ICD-10-CM | POA: Diagnosis not present

## 2013-08-14 DIAGNOSIS — J069 Acute upper respiratory infection, unspecified: Secondary | ICD-10-CM | POA: Diagnosis not present

## 2013-08-14 DIAGNOSIS — M549 Dorsalgia, unspecified: Secondary | ICD-10-CM

## 2013-08-14 DIAGNOSIS — Z9181 History of falling: Secondary | ICD-10-CM

## 2013-08-14 DIAGNOSIS — W19XXXD Unspecified fall, subsequent encounter: Secondary | ICD-10-CM

## 2013-08-14 DIAGNOSIS — I10 Essential (primary) hypertension: Secondary | ICD-10-CM

## 2013-08-14 DIAGNOSIS — F329 Major depressive disorder, single episode, unspecified: Secondary | ICD-10-CM | POA: Diagnosis not present

## 2013-08-14 DIAGNOSIS — L0291 Cutaneous abscess, unspecified: Secondary | ICD-10-CM | POA: Diagnosis not present

## 2013-08-14 DIAGNOSIS — E119 Type 2 diabetes mellitus without complications: Secondary | ICD-10-CM | POA: Diagnosis not present

## 2013-08-14 DIAGNOSIS — I2699 Other pulmonary embolism without acute cor pulmonale: Secondary | ICD-10-CM | POA: Diagnosis not present

## 2013-08-14 DIAGNOSIS — E7800 Pure hypercholesterolemia, unspecified: Secondary | ICD-10-CM | POA: Diagnosis not present

## 2013-08-14 DIAGNOSIS — E1142 Type 2 diabetes mellitus with diabetic polyneuropathy: Secondary | ICD-10-CM | POA: Diagnosis not present

## 2013-08-14 DIAGNOSIS — F17200 Nicotine dependence, unspecified, uncomplicated: Secondary | ICD-10-CM | POA: Diagnosis not present

## 2013-08-14 DIAGNOSIS — W19XXXA Unspecified fall, initial encounter: Secondary | ICD-10-CM

## 2013-08-14 DIAGNOSIS — R296 Repeated falls: Secondary | ICD-10-CM | POA: Insufficient documentation

## 2013-08-14 DIAGNOSIS — Z7901 Long term (current) use of anticoagulants: Secondary | ICD-10-CM | POA: Diagnosis not present

## 2013-08-14 LAB — GLUCOSE, CAPILLARY: Glucose-Capillary: 197 mg/dL — ABNORMAL HIGH (ref 70–99)

## 2013-08-14 LAB — POCT GLYCOSYLATED HEMOGLOBIN (HGB A1C): Hemoglobin A1C: 10.4

## 2013-08-14 MED ORDER — INSULIN NPH ISOPHANE & REGULAR (70-30) 100 UNIT/ML ~~LOC~~ SUSP: 30.0000 [IU] | Freq: Two times a day (BID) | SUBCUTANEOUS | Status: DC

## 2013-08-14 NOTE — Patient Instructions (Signed)
General Instructions: Please increase you insulin to 30 units twice a day - that is before breakfast and before dinner I will make a referral to a foot doctor  I will also make a referral to pain clinic  I will make an order for home physical therapy  Please check you blood sugars three time a day - that is before breakfast, before lunch and before dinner  Joy Butler will call you so that you can bring in your blood sugar and make further changes as needed  I will see you again some time in October this year     Treatment Goals:  Goals (1 Years of Data) as of 08/14/13         As of Today 06/28/13 04/19/13 03/15/13 03/01/13     Blood Pressure    . Blood Pressure < 120/80  143/84 127/79 135/84 118/76 102/66     Result Component    . HEMOGLOBIN A1C < 7.0  10.4  10.3      . LDL CALC < 70            Progress Toward Treatment Goals:  Treatment Goal 08/14/2013  Hemoglobin A1C -  Blood pressure -  Stop smoking -  Prevent falls unchanged    Self Care Goals & Plans:  Self Care Goal 08/14/2013  Manage my medications bring my medications to every visit; take my medicines as prescribed; refill my medications on time  Monitor my health -  Eat healthy foods -  Be physically active -  Stop smoking -    Home Blood Glucose Monitoring 08/14/2013  Check my blood sugar 3 times a day  When to check my blood sugar -     Care Management & Community Referrals:  Referral 02/17/2013  Referrals made for care management support [facility] case management program

## 2013-08-14 NOTE — Assessment & Plan Note (Signed)
BP Readings from Last 3 Encounters:  08/14/13 143/84  06/28/13 127/79  04/19/13 135/84    Lab Results  Component Value Date   NA 141 01/30/2013   K 3.8 01/30/2013   CREATININE 0.93 01/30/2013    Assessment: Blood pressure control: controlled Progress toward BP goal:    Comments:   Plan: Medications:  continue current medications Educational resources provided:   Self management tools provided:   Other plans: none

## 2013-08-14 NOTE — Assessment & Plan Note (Addendum)
Will consider Neurontin for PNP Referral to Podiatry as per patient request.

## 2013-08-14 NOTE — Assessment & Plan Note (Signed)
Patient reports weakness in her legs bilaterally. Prev history of falls but no recent falls. Will order home health PT.

## 2013-08-14 NOTE — Progress Notes (Signed)
Patient ID: Joy Butler, female   DOB: [birth year], 62 y.o.   MRN: [id number]   Subjective:   HPI: [name] is a 62 y.o. woman with past medical history of uncontrolled diabetes, hypertension, history of pulmonary embolism on chronic Coumadin therapy, presents to the clinic for routine followup with me.  Patient has lots of chronic complaints including low back pain, lower extremity pain and weakness. She denies recent fatigue, nausea, vomiting. No recent falls.  DM2, uncontrolled -  Lab Results  Component Value Date   HGBA1C 10.4 08/14/2013   Patient checking blood sugars 2 times daily, before breakfast and before dinner. Reports fasting blood sugars of 200-300 mg/dL. Currently taking NPH-regular 70/30  25 units twice a day. Patient misses doses 0 x per week on average.  0 hypoglycemic episodes since last visit. Denies assisted hypoglycemia or recently hospitalizations for either hyper or hypoglycemia. denies polyuria, polydipsia, nausea, vomiting, diarrhea.  does request refills today.  In regards to diabetic complications:  Microvascular complications: Confirms: peripheral neuropathy ;    Important diabetic medications: Is patient on aspirin? No Is patient on a statin? Yes Is patient on an ACE-I/ ARB? Yes   Patient requests a referral to a podiatrist. He also reports that he has not recently been receiving home health PT, which has helped her in the past.  Kindly see the A&P for the status of the pt's chronic medical problems.         Past Medical History  Diagnosis Date  . Diabetes mellitus     diagnosed in 2000.    Marland Kitchen Hypertension   . Pulmonary embolism     2011 treated at [facility] in [place].  Was on Coumadin for over a year.  no known family history  . UTI (urinary tract infection)   . High cholesterol   . 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 amitriptyline (ELAVIL) 25 MG  tablet Take 2 tablets (50 mg total) by mouth at bedtime.  60 tablet  3  . amLODipine (NORVASC) 10 MG tablet Take 1 tablet (10 mg total) by mouth daily.  30 tablet  2  . Blood Glucose Monitoring Suppl (ACCU-CHEK AVIVA PLUS) W/DEVICE KIT 1 kit by Does not apply route 3 (three) times daily. Please check blood sugar 3 to 4 times daily. diag code 250.0. Insulin dependent  1 kit  0  . Blood Pressure KIT 1 Units by Does not apply route daily.  1 each  0  . citalopram (CELEXA) 20 MG tablet Take 1 tablet (20 mg total) by mouth daily.  30 tablet  2  . glucose blood (ACCU-CHEK AVIVA) test strip Please check blood sugar 3 to 4 times daily. diag code 250.0. Insulin dependent  100 each  12  . ibuprofen (ADVIL,MOTRIN) 800 MG tablet TAKE 1 TABLET BY MOUTH EVERY DAY AS NEEDED FOR PAIN  30 tablet  2  . ibuprofen (MOTRIN IB) 200 MG tablet Take 4 tablets (800 mg total) by mouth every morning.  100 tablet  2  . insulin NPH-regular (NOVOLIN 70/30) (70-30) 100 UNIT/ML injection Inject 30 Units into the skin 2 (two) times daily with a meal.  20 mL  11  . Insulin Syringes, Disposable, U-100 1 ML MISC 1 Units by Does not apply route 2 (two) times daily.  100 each  11  . Lancet Devices (ACCU-CHEK SOFTCLIX) lancets Use to check blood sugars  3 to 4 times daily. Dx code: 250.00. Insulin dependent.  100 each  0  . losartan-hydrochlorothiazide (HYZAAR) 100-25 MG per tablet Take 1 tablet by mouth daily.  30 tablet  11  . omeprazole (PRILOSEC) 20 MG capsule Take 1 capsule (20 mg total) by mouth daily.  30 capsule  3  . pravastatin (PRAVACHOL) 40 MG tablet Take 1 tablet (40 mg total) by mouth every evening.  30 tablet  11  . warfarin (COUMADIN) 5 MG tablet TAKE 2 1/2 TABLETS BY MOUTH DAILY EXCEPT MONDAYS AND THURSDAYS WHEN YOU TAKE 3 TABLETS BY MOUTH DAILY.  100 tablet  0   No current facility-administered medications for this visit.   Family History  Problem Relation Age of Onset  . Adopted: Yes   History   Social History  .  Marital Status: Single    Spouse Name: N/A    Number of Children: N/A  . Years of Education: N/A   Social History Main Topics  . Smoking status: Current Every Day Smoker -- 0.05 packs/day for 10 years    Types: Cigarettes  . Smokeless tobacco: Never Used     Comment: Wants patches.  . Alcohol Use: No  . Drug Use: No  . Sexual Activity: Not Currently   Other Topics Concern  . None   Social History Narrative   Previously divorced now engaged with her partner of 4 years.  Has 6 grown children with 21 grandchildren.  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  Moved to [place] in August 2013 and was transiently homeless until first part of October 2013.     Review of Systems: Constitutional: Denies fever, chills, diaphoresis, appetite change and fatigue.  Respiratory: Denies SOB, DOE, cough, chest tightness, and wheezing.  Cardiovascular: No chest pain, palpitations and leg swelling.  Gastrointestinal: No abdominal pain, nausea, vomiting, bloody stools Genitourinary: No dysuria, frequency, hematuria, or flank pain.  Musculoskeletal:  chronic back pain, and pain in her extremities associated with weakness.  Psych: Reports significant improvement in her depression symptoms with Celexa.  Objective:  Physical Exam: Filed Vitals:   08/14/13 0922  BP: 143/84  Pulse: 86  Temp: 97 F (36.1 C)  TempSrc: Oral  Height: 5\' 9"  (1.753 m)  Weight: 246 lb 4.8 oz (111.721 kg)  SpO2: 98%   General: Well nourished. No acute distress. Husband in exam room.  Lungs: CTA bilaterally. Heart: RRR; no extra sounds or murmurs  Abdomen: Non-distended, normal BS, soft, nontender; no hepatosplenomegaly  Extremities: No pedal edema. No joint swelling or tenderness. Neurologic: Alert and oriented x3. No obvious neurologic deficits.  Assessment & Plan:  I have discussed my assessment and plan  with Dr. Josem Kaufmann  as detailed under problem based charting.

## 2013-08-14 NOTE — Assessment & Plan Note (Signed)
Lab Results  Component Value Date   HGBA1C 10.4 08/14/2013   HGBA1C 10.3 04/19/2013   HGBA1C 9.6 01/30/2013     Assessment: Diabetes control: poor control (HgbA1C >9%) Progress toward A1C goal:    Comments:  Her grand children helping her with her insulin. She reports good compliance with her current dose  Plan: Medications:  Will increase her NPH-Regular 70/30 insulin from 25 units bid to to 30 units twice a day. Home glucose monitoring: Frequency: 3 times a day Timing:   Instruction/counseling given: reminded to bring blood glucose meter & log to each visit, reminded to bring medications to each visit and discussed foot care Educational resources provided:   Self management tools provided:   Other plans: will bring in her meter within 2 weeks to determine if she needs changing her insulin dose. I'll send a message to Lupita Leash to send a reminder.

## 2013-08-14 NOTE — Assessment & Plan Note (Signed)
Patient requests for a referral for injection in her back since these have helped her in the past. She is currently taking ibuprofen 800 mg twice a day as needed.   Plan. -Referral to the pain clinic.

## 2013-08-15 ENCOUNTER — Telehealth: Payer: Self-pay | Admitting: Dietician

## 2013-08-15 NOTE — Telephone Encounter (Signed)
Called patient to discuss blood sugar checks after yesterday's insulin change: patient verbalized understanding to checking 3 times a day dn bringing meter in Friday or Tuesday to be downloaded. Explained that CDE would discuss download with physician and get back to her a few days afterwards.Joy Butler

## 2013-08-15 NOTE — Progress Notes (Signed)
Case discussed with Dr. Kazibwe soon after the resident saw the patient.  We reviewed the resident's history and exam and pertinent patient test results.  I agree with the assessment, diagnosis and plan of care documented in the resident's note. 

## 2013-08-22 ENCOUNTER — Telehealth: Payer: Self-pay | Admitting: Dietician

## 2013-08-22 NOTE — Telephone Encounter (Signed)
Meter downloaded: before insulin change average was 272 with range of 203-367 After insulin change average is 215 with range of 142 and range of 142- 363 with 5/6 of the last 3 days readings in 100s.   Meter download is in your box for review.  Do not recommend further change at this point.

## 2013-08-22 NOTE — Telephone Encounter (Signed)
Thanks Lupita Leash. I will take a look at the download tomorrow.

## 2013-09-05 ENCOUNTER — Encounter: Payer: Self-pay | Admitting: Physical Medicine & Rehabilitation

## 2013-09-05 ENCOUNTER — Other Ambulatory Visit: Payer: Self-pay | Admitting: Internal Medicine

## 2013-09-13 ENCOUNTER — Encounter: Payer: Self-pay | Admitting: Internal Medicine

## 2013-09-13 ENCOUNTER — Encounter: Payer: Medicare Other | Admitting: Internal Medicine

## 2013-09-26 ENCOUNTER — Encounter: Payer: Self-pay | Admitting: Physical Medicine & Rehabilitation

## 2013-09-26 ENCOUNTER — Encounter: Payer: Medicare Other | Attending: Physical Medicine & Rehabilitation

## 2013-09-26 ENCOUNTER — Ambulatory Visit (HOSPITAL_BASED_OUTPATIENT_CLINIC_OR_DEPARTMENT_OTHER): Payer: Medicare Other | Admitting: Physical Medicine & Rehabilitation

## 2013-09-26 VITALS — BP 122/68 | HR 107 | Resp 16 | Ht 69.0 in | Wt 254.0 lb

## 2013-09-26 DIAGNOSIS — M549 Dorsalgia, unspecified: Secondary | ICD-10-CM | POA: Diagnosis not present

## 2013-09-26 DIAGNOSIS — R209 Unspecified disturbances of skin sensation: Secondary | ICD-10-CM | POA: Diagnosis not present

## 2013-09-26 DIAGNOSIS — G8929 Other chronic pain: Secondary | ICD-10-CM

## 2013-09-26 DIAGNOSIS — Z79899 Other long term (current) drug therapy: Secondary | ICD-10-CM | POA: Diagnosis not present

## 2013-09-26 DIAGNOSIS — Z5181 Encounter for therapeutic drug level monitoring: Secondary | ICD-10-CM | POA: Diagnosis not present

## 2013-09-26 DIAGNOSIS — M47817 Spondylosis without myelopathy or radiculopathy, lumbosacral region: Secondary | ICD-10-CM

## 2013-09-26 MED ORDER — DIAZEPAM 10 MG PO TABS: 10.0000 mg | ORAL_TABLET | Freq: Once | ORAL | Status: DC

## 2013-09-26 NOTE — Patient Instructions (Addendum)
Recommend to Tylenol 3 times per day  We'll need okay from your Dr., to stop Coumadin for one week prior to injection. We'll need a PT INR within 24 hours of injection

## 2013-09-26 NOTE — Progress Notes (Signed)
Subjective:    Patient ID: Joy Butler, female    DOB: [birth year], 62 y.o.   MRN: [id number]  HPI Pain Inventory Average Pain 9 Pain Right Now 9 My pain is sharp, burning, dull, stabbing, tingling, aching and other  In the last 24 hours, has pain interfered with the following? General activity 8 Relation with others 0 Enjoyment of life 0 What TIME of day is your pain at its worst? all day Sleep (in general) Good  Pain is worse with: walking Pain improves with: nothing Relief from Meds: none with ibuprofen   Mobility walk with assistance use a cane how many minutes can you walk? 2 ability to climb steps?  yes do you drive?  yes Do you have any goals in this area?  yes  Function disabled: date disabled 2008 I need assistance with the following:  household duties Do you have any goals in this area?  yes  Neuro/Psych weakness numbness tingling trouble walking spasms  Prior Studies Any changes since last visit?  no  Physicians involved in your care Any changes since last visit?  no   Family History  Problem Relation Age of Onset  . Adopted: Yes   History   Social History  . Marital Status: Single    Spouse Name: N/A    Number of Children: N/A  . Years of Education: N/A   Social History Main Topics  . Smoking status: Current Every Day Smoker -- 0.05 packs/day for 10 years    Types: Cigarettes  . Smokeless tobacco: Never Used     Comment: Wants patches.  . Alcohol Use: No  . Drug Use: No  . Sexual Activity: Not Currently   Other Topics Concern  . None   Social History Narrative   Previously divorced now engaged with her partner of 4 years.  Has 6 grown children with 21 grandchildren.  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  Moved to [place] in August 2013 and was transiently homeless until first part of October 2013.     Past Surgical History  Procedure Laterality Date  . Abdominal hysterectomy    .  Cholecystectomy      1980  . Esophagogastroduodenoscopy  08/19/2012    Procedure: ESOPHAGOGASTRODUODENOSCOPY (EGD);  Surgeon: Vertell Novak., MD;  Location: [facility] ENDOSCOPY;  Service: Endoscopy;  Laterality: N/A;  . Cesarean section    . Hand surgery     Past Medical History  Diagnosis Date  . Diabetes mellitus     diagnosed in 2000.    Marland Kitchen Hypertension   . Pulmonary embolism     2011 treated at [facility] in [place].  Was on Coumadin for over a year.  no known family history  . UTI (urinary tract infection)   . High cholesterol   . 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 Depression   . Neuropathy     feet   BP 122/68  Pulse 107  Resp 16  Ht 5\' 9"  (1.753 m)  Wt 254 lb (115.214 kg)  BMI 37.49 kg/m2  SpO2 96%      Review of Systems  Constitutional: Positive for unexpected weight change.  Respiratory: Positive for cough.   Gastrointestinal: Positive for constipation.  Endocrine:       High blood sugar  Musculoskeletal: Positive for gait problem.  Neurological: Positive for weakness and numbness.       Tingling, spasms  All  other systems reviewed and are negative.       Objective:   Physical Exam        Assessment & Plan:  Refer to  other note Subjective:    Joy Butler is a 62 y.o. female who presents for evaluation of low back pain. The patient has had recurrent self limited episodes of low back pain in the past. Symptoms have been present for 10 yrs and are gradually worsening.  Onset was related to / precipitated by no known injury. The pain is located in the across the lower back or radiating to bilateral leg(s) and radiates to the right lower leg, left lower leg, radiates occasionally. The pain is described as aching and occurs all day. She rates her pain as severe. Symptoms are exacerbated by standing for more than 2 minutes. Symptoms are improved by acetaminophen and last for 2 hours. She has also tried exercise, heat, NSAIDs and Home Health PT  came out Feb-May 2014 which provided no symptom relief. She has no other symptoms associated with the back pain. The patient has no "red flag" history indicative of complicated back pain. Has had relief back injections that last 2-3 mo.  Was sedated for injections.  [facility] See other note PMH:  Diabetes with neuropathy, Hgb A1C >10 Review of Systems see other note    Objective:   Full range of motion without pain, no tenderness, no spasm, no curvature. Normal reflexes, gait, strength and negative straight-leg raise. Inspection and palpation: inspection of back is normal, paraspinal tenderness noted L5-S1, gait imbalance wide based. Muscle tone and ROM exam: muscle tone normal without spasm, Normal hip, knee, ankle range of motion. Straight leg raise: negative at 60 degrees bilaterally. Neurological: right knee reflex 1+, left knee reflex 1+, right ankle reflex trace, left ankle reflex trace, decreased sensation of Reduced sensation in both feet, sparing right S1. Reduced sensation both hands to pinprick.   No pain over the thoracic paraspinals or thoracic spinous processes Assessment:    Lumbar spondylosis causing axial back pain in the L5-S1 region. No evidence of her data lap the. 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 Questions answered. Agricultural engineer distributed. Heat to affected area as needed for local pain relief. OTC analgesics as needed. Follow-up in 3 week.  Recommend lumbar medial branch blocks L3-L4 as well as L5 dorsal ramus bilateral. We will give Valium prior to injection  Request to hold coumadin and PT/INR check for injection faxed to Dr [facility]

## 2013-10-02 ENCOUNTER — Telehealth: Payer: Self-pay

## 2013-10-02 NOTE — Telephone Encounter (Signed)
Message copied by Judd Gaudier on Mon Oct 02, 2013  4:17 PM ------      Message from: Su Monks      Created: Mon Oct 02, 2013 12:19 PM       New patient , not on narcotics yet, but please educate patient about risks, and consequences ------

## 2013-10-02 NOTE — Telephone Encounter (Signed)
Patient educated regarding alcohol risks.

## 2013-10-03 ENCOUNTER — Other Ambulatory Visit: Payer: Self-pay | Admitting: Internal Medicine

## 2013-10-03 ENCOUNTER — Telehealth: Payer: Self-pay | Admitting: *Deleted

## 2013-10-03 NOTE — Telephone Encounter (Signed)
Left message for Joy Butler to [facility] so that we can inform her permission obtained to hold coumadin 5 days prior to her scheduled injection with Dr Wynn Banker.

## 2013-10-03 NOTE — Progress Notes (Signed)
Request was sent to Dr Zada Girt to hold Stephene's coumadin for injection.  Permission received today to hold.  [place] notified.

## 2013-10-06 NOTE — Telephone Encounter (Signed)
Left message to [facility] to inform patient that she is clear to hold coumadin for 5 days prior to injection

## 2013-10-09 MED ORDER — DIAZEPAM 10 MG PO TABS: 10.0000 mg | ORAL_TABLET | Freq: Once | ORAL | Status: DC

## 2013-10-09 NOTE — Telephone Encounter (Signed)
Patient advised to stop coumadin 5 days prior to injection and have PT/INR tested 24 hours before injection.  Patient also request we call in her valium to [facility].  This was done.  Patient aware.

## 2013-10-26 ENCOUNTER — Encounter: Payer: Medicare Other | Attending: Physical Medicine & Rehabilitation

## 2013-10-26 ENCOUNTER — Ambulatory Visit: Payer: Medicare Other | Admitting: Physical Medicine & Rehabilitation

## 2013-10-26 DIAGNOSIS — M47817 Spondylosis without myelopathy or radiculopathy, lumbosacral region: Secondary | ICD-10-CM | POA: Insufficient documentation

## 2013-10-26 DIAGNOSIS — M549 Dorsalgia, unspecified: Secondary | ICD-10-CM | POA: Insufficient documentation

## 2013-10-26 DIAGNOSIS — R209 Unspecified disturbances of skin sensation: Secondary | ICD-10-CM | POA: Insufficient documentation

## 2013-10-31 ENCOUNTER — Other Ambulatory Visit: Payer: Self-pay | Admitting: Internal Medicine

## 2013-11-23 ENCOUNTER — Ambulatory Visit: Payer: Medicare Other | Admitting: Physical Medicine & Rehabilitation

## 2013-11-23 ENCOUNTER — Encounter: Payer: Medicare Other | Attending: Physical Medicine & Rehabilitation

## 2013-11-23 DIAGNOSIS — M549 Dorsalgia, unspecified: Secondary | ICD-10-CM | POA: Insufficient documentation

## 2013-11-23 DIAGNOSIS — R209 Unspecified disturbances of skin sensation: Secondary | ICD-10-CM | POA: Insufficient documentation

## 2013-11-23 DIAGNOSIS — M47817 Spondylosis without myelopathy or radiculopathy, lumbosacral region: Secondary | ICD-10-CM | POA: Insufficient documentation

## 2013-12-29 ENCOUNTER — Encounter (HOSPITAL_COMMUNITY): Payer: Self-pay | Admitting: Emergency Medicine

## 2013-12-29 ENCOUNTER — Emergency Department (HOSPITAL_COMMUNITY)
Admission: EM | Admit: 2013-12-29 | Discharge: 2013-12-29 | Disposition: A | Discharge status: Home / Self Care | Payer: Medicare Other | Attending: Emergency Medicine | Admitting: Emergency Medicine

## 2013-12-29 DIAGNOSIS — Z791 Long term (current) use of non-steroidal anti-inflammatories (NSAID): Secondary | ICD-10-CM | POA: Insufficient documentation

## 2013-12-29 DIAGNOSIS — E119 Type 2 diabetes mellitus without complications: Secondary | ICD-10-CM | POA: Diagnosis not present

## 2013-12-29 DIAGNOSIS — F329 Major depressive disorder, single episode, unspecified: Secondary | ICD-10-CM | POA: Insufficient documentation

## 2013-12-29 DIAGNOSIS — Z9089 Acquired absence of other organs: Secondary | ICD-10-CM | POA: Diagnosis not present

## 2013-12-29 DIAGNOSIS — F3289 Other specified depressive episodes: Secondary | ICD-10-CM | POA: Insufficient documentation

## 2013-12-29 DIAGNOSIS — Z86711 Personal history of pulmonary embolism: Secondary | ICD-10-CM | POA: Insufficient documentation

## 2013-12-29 DIAGNOSIS — Z794 Long term (current) use of insulin: Secondary | ICD-10-CM | POA: Diagnosis not present

## 2013-12-29 DIAGNOSIS — R259 Unspecified abnormal involuntary movements: Secondary | ICD-10-CM | POA: Diagnosis not present

## 2013-12-29 DIAGNOSIS — Z91040 Latex allergy status: Secondary | ICD-10-CM | POA: Insufficient documentation

## 2013-12-29 DIAGNOSIS — G8929 Other chronic pain: Secondary | ICD-10-CM | POA: Diagnosis not present

## 2013-12-29 DIAGNOSIS — Z862 Personal history of diseases of the blood and blood-forming organs and certain disorders involving the immune mechanism: Secondary | ICD-10-CM | POA: Diagnosis not present

## 2013-12-29 DIAGNOSIS — M545 Low back pain, unspecified: Secondary | ICD-10-CM | POA: Diagnosis not present

## 2013-12-29 DIAGNOSIS — E7800 Pure hypercholesterolemia, unspecified: Secondary | ICD-10-CM | POA: Insufficient documentation

## 2013-12-29 DIAGNOSIS — Z87440 Personal history of urinary (tract) infections: Secondary | ICD-10-CM | POA: Insufficient documentation

## 2013-12-29 DIAGNOSIS — R112 Nausea with vomiting, unspecified: Secondary | ICD-10-CM | POA: Insufficient documentation

## 2013-12-29 DIAGNOSIS — R7301 Impaired fasting glucose: Secondary | ICD-10-CM | POA: Diagnosis not present

## 2013-12-29 DIAGNOSIS — R52 Pain, unspecified: Secondary | ICD-10-CM | POA: Diagnosis not present

## 2013-12-29 DIAGNOSIS — B9789 Other viral agents as the cause of diseases classified elsewhere: Secondary | ICD-10-CM | POA: Insufficient documentation

## 2013-12-29 DIAGNOSIS — I10 Essential (primary) hypertension: Secondary | ICD-10-CM | POA: Insufficient documentation

## 2013-12-29 DIAGNOSIS — B349 Viral infection, unspecified: Secondary | ICD-10-CM

## 2013-12-29 DIAGNOSIS — Z7901 Long term (current) use of anticoagulants: Secondary | ICD-10-CM | POA: Diagnosis not present

## 2013-12-29 DIAGNOSIS — Z8669 Personal history of other diseases of the nervous system and sense organs: Secondary | ICD-10-CM | POA: Diagnosis not present

## 2013-12-29 DIAGNOSIS — R739 Hyperglycemia, unspecified: Secondary | ICD-10-CM

## 2013-12-29 DIAGNOSIS — Z79899 Other long term (current) drug therapy: Secondary | ICD-10-CM | POA: Insufficient documentation

## 2013-12-29 DIAGNOSIS — F17200 Nicotine dependence, unspecified, uncomplicated: Secondary | ICD-10-CM | POA: Diagnosis not present

## 2013-12-29 DIAGNOSIS — R7309 Other abnormal glucose: Secondary | ICD-10-CM | POA: Diagnosis not present

## 2013-12-29 DIAGNOSIS — R197 Diarrhea, unspecified: Secondary | ICD-10-CM | POA: Insufficient documentation

## 2013-12-29 LAB — CBC WITH DIFFERENTIAL/PLATELET
BASOS ABS: 0.1 10*3/uL (ref 0.0–0.1)
Basophils Relative: 1 % (ref 0–1)
EOS ABS: 0.3 10*3/uL (ref 0.0–0.7)
EOS PCT: 3 % (ref 0–5)
HCT: 42.3 % (ref 36.0–46.0)
Hemoglobin: 14.3 g/dL (ref 12.0–15.0)
LYMPHS ABS: 2.7 10*3/uL (ref 0.7–4.0)
Lymphocytes Relative: 28 % (ref 12–46)
MCH: 27.8 pg (ref 26.0–34.0)
MCHC: 33.8 g/dL (ref 30.0–36.0)
MCV: 82.3 fL (ref 78.0–100.0)
Monocytes Absolute: 1.1 10*3/uL — ABNORMAL HIGH (ref 0.1–1.0)
Monocytes Relative: 11 % (ref 3–12)
Neutro Abs: 5.5 10*3/uL (ref 1.7–7.7)
Neutrophils Relative %: 58 % (ref 43–77)
Platelets: 330 10*3/uL (ref 150–400)
RBC: 5.14 MIL/uL — ABNORMAL HIGH (ref 3.87–5.11)
RDW: 16 % — AB (ref 11.5–15.5)
WBC: 9.5 10*3/uL (ref 4.0–10.5)

## 2013-12-29 LAB — URINALYSIS, ROUTINE W REFLEX MICROSCOPIC
BILIRUBIN URINE: NEGATIVE
Glucose, UA: >1000 mg/dL — AB
Ketones, ur: NEGATIVE mg/dL
LEUKOCYTES UA: NEGATIVE
Nitrite: NEGATIVE
PH: 6.5 (ref 5.0–8.0)
Protein, ur: >300 mg/dL — AB
SPECIFIC GRAVITY, URINE: 1.028 (ref 1.005–1.030)
Urobilinogen, UA: 0.2 mg/dL (ref 0.0–1.0)

## 2013-12-29 LAB — GLUCOSE, CAPILLARY
GLUCOSE-CAPILLARY: 252 mg/dL — AB (ref 70–99)
Glucose-Capillary: 363 mg/dL — ABNORMAL HIGH (ref 70–99)
Glucose-Capillary: 314 mg/dL — ABNORMAL HIGH (ref 70–99)

## 2013-12-29 LAB — URINE MICROSCOPIC-ADD ON

## 2013-12-29 LAB — COMPREHENSIVE METABOLIC PANEL
ALT: 10 U/L (ref 0–35)
AST: 11 U/L (ref 0–37)
Albumin: 2.3 g/dL — ABNORMAL LOW (ref 3.5–5.2)
Alkaline Phosphatase: 128 U/L — ABNORMAL HIGH (ref 39–117)
BUN: 12 mg/dL (ref 6–23)
CALCIUM: 8.8 mg/dL (ref 8.4–10.5)
CO2: 24 mEq/L (ref 19–32)
CREATININE: 1.08 mg/dL (ref 0.50–1.10)
Chloride: 101 mEq/L (ref 96–112)
GFR calc Af Amer: 62 mL/min — ABNORMAL LOW (ref >90)
GFR calc non Af Amer: 53 mL/min — ABNORMAL LOW (ref >90)
GLUCOSE: 391 mg/dL — AB (ref 70–99)
Potassium: 3.7 mEq/L (ref 3.7–5.3)
SODIUM: 139 meq/L (ref 137–147)
TOTAL PROTEIN: 7.1 g/dL (ref 6.0–8.3)
Total Bilirubin: <0.2 mg/dL — ABNORMAL LOW (ref 0.3–1.2)

## 2013-12-29 LAB — PROTIME-INR
INR: 0.89 (ref 0.00–1.49)
Prothrombin Time: 11.9 seconds (ref 11.6–15.2)

## 2013-12-29 MED ORDER — SODIUM CHLORIDE 0.9 % IV SOLN: 1000.0000 mL | INTRAVENOUS | Status: DC

## 2013-12-29 MED ORDER — INSULIN ASPART 100 UNIT/ML ~~LOC~~ SOLN
8.0000 [IU] | Freq: Once | SUBCUTANEOUS | Status: AC
  Administered 2013-12-29: 8 [IU] via INTRAVENOUS
  Filled 2013-12-29: qty 1

## 2013-12-29 MED ORDER — SODIUM CHLORIDE 0.9 % IV SOLN
1000.0000 mL | Freq: Once | INTRAVENOUS | Status: AC
  Administered 2013-12-29: 1000 mL via INTRAVENOUS

## 2013-12-29 MED ORDER — HYDROMORPHONE HCL PF 1 MG/ML IJ SOLN
1.0000 mg | Freq: Once | INTRAMUSCULAR | Status: AC
  Administered 2013-12-29: 1 mg via INTRAVENOUS
  Filled 2013-12-29: qty 1

## 2013-12-29 MED ORDER — LOPERAMIDE HCL 2 MG PO CAPS: 2.0000 mg | ORAL_CAPSULE | Freq: Four times a day (QID) | ORAL | Status: DC | PRN

## 2013-12-29 MED ORDER — ONDANSETRON 8 MG PO TBDP: 8.0000 mg | ORAL_TABLET | Freq: Three times a day (TID) | ORAL | Status: DC | PRN

## 2013-12-29 MED ORDER — ONDANSETRON HCL 4 MG/2ML IJ SOLN
4.0000 mg | Freq: Once | INTRAMUSCULAR | Status: AC
  Administered 2013-12-29: 4 mg via INTRAVENOUS
  Filled 2013-12-29: qty 2

## 2013-12-29 MED ORDER — DICYCLOMINE HCL 10 MG/ML IM SOLN
20.0000 mg | Freq: Once | INTRAMUSCULAR | Status: AC
  Administered 2013-12-29: 20 mg via INTRAMUSCULAR
  Filled 2013-12-29: qty 2

## 2013-12-29 MED ORDER — FENTANYL CITRATE 0.05 MG/ML IJ SOLN
100.0000 ug | Freq: Once | INTRAMUSCULAR | Status: AC
  Administered 2013-12-29: 100 ug via INTRAVENOUS
  Filled 2013-12-29: qty 2

## 2013-12-29 MED ORDER — KETOROLAC TROMETHAMINE 30 MG/ML IJ SOLN
30.0000 mg | Freq: Once | INTRAMUSCULAR | Status: AC
  Administered 2013-12-29: 30 mg via INTRAVENOUS
  Filled 2013-12-29: qty 1

## 2013-12-29 MED ORDER — DICYCLOMINE HCL 20 MG PO TABS: 20.0000 mg | ORAL_TABLET | Freq: Four times a day (QID) | ORAL | Status: DC | PRN

## 2013-12-29 MED ORDER — TRAMADOL HCL 50 MG PO TABS: 50.0000 mg | ORAL_TABLET | Freq: Four times a day (QID) | ORAL | Status: DC | PRN

## 2013-12-29 NOTE — Discharge Instructions (Signed)
Take medications as prescribed.  Stick to bland diet.  Follow up with your doctor for recheck in 2-3 days.  Return to the ER for worsening condition or new concerning symptoms.   Diarrhea Diarrhea is frequent loose and watery bowel movements. It can cause you to feel weak and dehydrated. Dehydration can cause you to become tired and thirsty, have a dry mouth, and have decreased urination that often is dark yellow. Diarrhea is a sign of another problem, most often an infection that will not last long. In most cases, diarrhea typically lasts 2 3 days. However, it can last longer if it is a sign of something more serious. It is important to treat your diarrhea as directed by your caregive to lessen or prevent future episodes of diarrhea. CAUSES  Some common causes include:  Gastrointestinal infections caused by viruses, bacteria, or parasites.  Food poisoning or food allergies.  Certain medicines, such as antibiotics, chemotherapy, and laxatives.  Artificial sweeteners and fructose.  Digestive disorders. HOME CARE INSTRUCTIONS  Ensure adequate fluid intake (hydration): have 1 cup (8 oz) of fluid for each diarrhea episode. Avoid fluids that contain simple sugars or sports drinks, fruit juices, whole milk products, and sodas. Your urine should be clear or pale yellow if you are drinking enough fluids. Hydrate with an oral rehydration solution that you can purchase at pharmacies, retail stores, and online. You can prepare an oral rehydration solution at home by mixing the following ingredients together:    tsp table salt.   tsp baking soda.   tsp salt substitute containing potassium chloride.  1  tablespoons sugar.  1 L (34 oz) of water.  Certain foods and beverages may increase the speed at which food moves through the gastrointestinal (GI) tract. These foods and beverages should be avoided and include:  Caffeinated and alcoholic beverages.  High-fiber foods, such as raw fruits and  vegetables, nuts, seeds, and whole grain breads and cereals.  Foods and beverages sweetened with sugar alcohols, such as xylitol, sorbitol, and mannitol.  Some foods may be well tolerated and may help thicken stool including:  Starchy foods, such as rice, toast, pasta, low-sugar cereal, oatmeal, grits, baked potatoes, crackers, and bagels.  Bananas.  Applesauce.  Add probiotic-rich foods to help increase healthy bacteria in the GI tract, such as yogurt and fermented milk products.  Wash your hands well after each diarrhea episode.  Only take over-the-counter or prescription medicines as directed by your caregiver.  Take a warm bath to relieve any burning or pain from frequent diarrhea episodes. SEEK IMMEDIATE MEDICAL CARE IF:   You are unable to keep fluids down.  You have persistent vomiting.  You have blood in your stool, or your stools are black and tarry.  You do not urinate in 6 8 hours, or there is only a small amount of very dark urine.  You have abdominal pain that increases or localizes.  You have weakness, dizziness, confusion, or lightheadedness.  You have a severe headache.  Your diarrhea gets worse or does not get better.  You have a fever or persistent symptoms for more than 2 3 days.  You have a fever and your symptoms suddenly get worse. MAKE SURE YOU:   Understand these instructions.  Will watch your condition.  Will get help right away if you are not doing well or get worse. Document Released: 11/27/2002 Document Revised: 11/23/2012 Document Reviewed: 08/14/2012 [facility] Patient Information 2014 [place], [place].  Diet for Diarrhea, Adult Frequent, runny stools (diarrhea)  may be caused or worsened by food or drink. Diarrhea may be relieved by changing your diet. Since diarrhea can last up to 7 days, it is easy for you to lose too much fluid from the body and become dehydrated. Fluids that are lost need to be replaced. Along with a modified diet,  make sure you drink enough fluids to keep your urine clear or pale yellow. DIET INSTRUCTIONS  Ensure adequate fluid intake (hydration): have 1 cup (8 oz) of fluid for each diarrhea episode. Avoid fluids that contain simple sugars or sports drinks, fruit juices, whole milk products, and sodas. Your urine should be clear or pale yellow if you are drinking enough fluids. Hydrate with an oral rehydration solution that you can purchase at pharmacies, retail stores, and online. You can prepare an oral rehydration solution at home by mixing the following ingredients together:    tsp table salt.   tsp baking soda.   tsp salt substitute containing potassium chloride.  1  tablespoons sugar.  1 L (34 oz) of water.  Certain foods and beverages may increase the speed at which food moves through the gastrointestinal (GI) tract. These foods and beverages should be avoided and include:  Caffeinated and alcoholic beverages.  High-fiber foods, such as raw fruits and vegetables, nuts, seeds, and whole grain breads and cereals.  Foods and beverages sweetened with sugar alcohols, such as xylitol, sorbitol, and mannitol.  Some foods may be well tolerated and may help thicken stool including:  Starchy foods, such as rice, toast, pasta, low-sugar cereal, oatmeal, grits, baked potatoes, crackers, and bagels.   Bananas.   Applesauce.  Add probiotic-rich foods to help increase healthy bacteria in the GI tract, such as yogurt and fermented milk products. RECOMMENDED FOODS AND BEVERAGES Starches Choose foods with less than 2 g of fiber per serving.  Recommended:  White, [place], and pita breads, plain rolls, buns, bagels. Plain muffins, matzo. Soda, saltine, or graham crackers. Pretzels, melba toast, zwieback. Cooked cereals made with water: cornmeal, farina, cream cereals. Dry cereals: refined corn, wheat, rice. Potatoes prepared any way without skins, refined macaroni, spaghetti, noodles, refined  rice.  Avoid:  Bread, rolls, or crackers made with whole wheat, multi-grains, rye, bran seeds, nuts, or coconut. Corn tortillas or taco shells. Cereals containing whole grains, multi-grains, bran, coconut, nuts, raisins. Cooked or dry oatmeal. Coarse wheat cereals, granola. Cereals advertised as "high-fiber." Potato skins. Whole grain pasta, wild or brown rice. Popcorn. Sweet potatoes, yams. Sweet rolls, doughnuts, waffles, pancakes, sweet breads. Vegetables  Recommended: Strained tomato and vegetable juices. Most well-cooked and canned vegetables without seeds. Fresh: Tender lettuce, cucumber without the skin, cabbage, spinach, bean sprouts.  Avoid: Fresh, cooked, or canned: Artichokes, baked beans, beet greens, broccoli, Brussels sprouts, corn, kale, legumes, peas, sweet potatoes. Cooked: Green or red cabbage, spinach. Avoid large servings of any vegetables because vegetables shrink when cooked, and they contain more fiber per serving than fresh vegetables. Fruit  Recommended: Cooked or canned: Apricots, applesauce, cantaloupe, cherries, fruit cocktail, grapefruit, grapes, kiwi, mandarin oranges, peaches, pears, plums, watermelon. Fresh: Apples without skin, ripe banana, grapes, cantaloupe, cherries, grapefruit, peaches, oranges, plums. Keep servings limited to  cup or 1 piece.  Avoid: Fresh: Apples with skin, apricots, mangoes, pears, raspberries, strawberries. Prune juice, stewed or dried prunes. Dried fruits, raisins, dates. Large servings of all fresh fruits. Protein  Recommended: Ground or well-cooked tender beef, ham, veal, lamb, pork, or poultry. Eggs. Fish, oysters, shrimp, lobster, other seafoods. Liver, organ meats.  Avoid:  Tough, fibrous meats with gristle. Peanut butter, smooth or chunky. Cheese, nuts, seeds, legumes, dried peas, beans, lentils. Dairy  Recommended: Yogurt, lactose-free milk, kefir, drinkable yogurt, buttermilk, soy milk, or plain hard cheese.  Avoid: Milk,  chocolate milk, beverages made with milk, such as milkshakes. Soups  Recommended: Bouillon, broth, or soups made from allowed foods. Any strained soup.  Avoid: Soups made from vegetables that are not allowed, cream or milk-based soups. Desserts and Sweets  Recommended: Sugar-free gelatin, sugar-free frozen ice pops made without sugar alcohol.  Avoid: Plain cakes and cookies, pie made with fruit, pudding, custard, cream pie. Gelatin, fruit, ice, sherbet, frozen ice pops. Ice cream, ice milk without nuts. Plain hard candy, honey, jelly, molasses, syrup, sugar, chocolate syrup, gumdrops, marshmallows. Fats and Oils  Recommended: Limit fats to less than 8 tsp per day.  Avoid: Seeds, nuts, olives, avocados. Margarine, butter, cream, mayonnaise, salad oils, plain salad dressings. Plain gravy, crisp bacon without rind. Beverages  Recommended: Water, decaffeinated teas, oral rehydration solutions, sugar-free beverages not sweetened with sugar alcohols.  Avoid: Fruit juices, caffeinated beverages (coffee, tea, soda), alcohol, sports drinks, or lemon-lime soda. Condiments  Recommended: Ketchup, mustard, horseradish, vinegar, cocoa powder. Spices in moderation: allspice, basil, bay leaves, celery powder or leaves, cinnamon, cumin powder, curry powder, ginger, mace, marjoram, onion or garlic powder, oregano, paprika, parsley flakes, ground pepper, rosemary, sage, savory, tarragon, thyme, turmeric.  Avoid: Coconut, honey. Document Released: 02/27/2004 Document Revised: 08/31/2012 Document Reviewed: 04/22/2012 [facility] Patient Information 2014 [place].  High Blood Sugar High blood sugar (hyperglycemia) means that the level of sugar in your blood is higher than it should be. Signs of high blood sugar include:  Feeling thirsty.  Frequent peeing (urinating).  Feeling tired or sleepy.  Dry mouth.  Vision changes.  Feeling weak.  Feeling hungry but losing weight.  Numbness and  tingling in your hands or feet.  Headache. When you ignore these signs, your blood sugar may keep going up. These problems may get worse, and other problems may begin. HOME CARE  Check your blood sugars as told by your doctor. Write down the numbers with the date and time.  Take the right amount of insulin or diabetes pills at the right time. Write down the dose with date and time.  Refill your insulin or diabetes pills before running out.  Watch what you eat. Follow your meal plan.  Drink liquids without sugar, such as water. Check with your doctor if you have kidney or heart disease.  Follow your doctor's orders for exercise. Exercise at the same time of day.  Keep your doctor's appointments. GET HELP RIGHT AWAY IF:   You have trouble thinking or are confused.  You have fast breathing with fruity smelling breath.  You pass out (faint).  You have 2 to 3 days of high blood sugars and you do not know why.  You have chest pain.  You are feeling sick to your stomach (nauseous) or throwing up (vomiting).  You have sudden vision changes. MAKE SURE YOU:   Understand these instructions.  Will watch your condition.  Will get help right away if you are not doing well or get worse. Document Released: 10/04/2009 Document Revised: 02/29/2012 Document Reviewed: 10/04/2009 [facility] Patient Information 2014 [place], [place].  Nausea and Vomiting Nausea is a sick feeling that often comes before throwing up (vomiting). Vomiting is a reflex where stomach contents come out of your mouth. Vomiting can cause severe loss of body fluids (dehydration). Children and elderly  adults can become dehydrated quickly, especially if they also have diarrhea. Nausea and vomiting are symptoms of a condition or disease. It is important to find the cause of your symptoms. CAUSES   Direct irritation of the stomach lining. This irritation can result from increased acid production (gastroesophageal reflux  disease), infection, food poisoning, taking certain medicines (such as nonsteroidal anti-inflammatory drugs), alcohol use, or tobacco use.  Signals from the brain.These signals could be caused by a headache, heat exposure, an inner ear disturbance, increased pressure in the brain from injury, infection, a tumor, or a concussion, pain, emotional stimulus, or metabolic problems.  An obstruction in the gastrointestinal tract (bowel obstruction).  Illnesses such as diabetes, hepatitis, gallbladder problems, appendicitis, kidney problems, cancer, sepsis, atypical symptoms of a heart attack, or eating disorders.  Medical treatments such as chemotherapy and radiation.  Receiving medicine that makes you sleep (general anesthetic) during surgery. DIAGNOSIS Your caregiver may ask for tests to be done if the problems do not improve after a few days. Tests may also be done if symptoms are severe or if the reason for the nausea and vomiting is not clear. Tests may include:  Urine tests.  Blood tests.  Stool tests.  Cultures (to look for evidence of infection).  X-rays or other imaging studies. Test results can help your caregiver make decisions about treatment or the need for additional tests. TREATMENT You need to stay well hydrated. Drink frequently but in small amounts.You may wish to drink water, sports drinks, clear broth, or eat frozen ice pops or gelatin dessert to help stay hydrated.When you eat, eating slowly may help prevent nausea.There are also some antinausea medicines that may help prevent nausea. HOME CARE INSTRUCTIONS   Take all medicine as directed by your caregiver.  If you do not have an appetite, do not force yourself to eat. However, you must continue to drink fluids.  If you have an appetite, eat a normal diet unless your caregiver tells you differently.  Eat a variety of complex carbohydrates (rice, wheat, potatoes, bread), lean meats, yogurt, fruits, and  vegetables.  Avoid high-fat foods because they are more difficult to digest.  Drink enough water and fluids to keep your urine clear or pale yellow.  If you are dehydrated, ask your caregiver for specific rehydration instructions. Signs of dehydration may include:  Severe thirst.  Dry lips and mouth.  Dizziness.  Dark urine.  Decreasing urine frequency and amount.  Confusion.  Rapid breathing or pulse. SEEK IMMEDIATE MEDICAL CARE IF:   You have blood or brown flecks (like coffee grounds) in your vomit.  You have black or bloody stools.  You have a severe headache or stiff neck.  You are confused.  You have severe abdominal pain.  You have chest pain or trouble breathing.  You do not urinate at least once every 8 hours.  You develop cold or clammy skin.  You continue to vomit for longer than 24 to 48 hours.  You have a fever. MAKE SURE YOU:   Understand these instructions.  Will watch your condition.  Will get help right away if you are not doing well or get worse. Document Released: 12/07/2005 Document Revised: 02/29/2012 Document Reviewed: 05/06/2011 [facility] Patient Information 2014 [place], [place].  Viral Infections A viral infection can be caused by different types of viruses.Most viral infections are not serious and resolve on their own. However, some infections may cause severe symptoms and may lead to further complications. SYMPTOMS Viruses can frequently cause:  Minor sore throat.  Aches and pains.  Headaches.  Runny nose.  Different types of rashes.  Watery eyes.  Tiredness.  Cough.  Loss of appetite.  Gastrointestinal infections, resulting in nausea, vomiting, and diarrhea. These symptoms do not respond to antibiotics because the infection is not caused by bacteria. However, you might catch a bacterial infection following the viral infection. This is sometimes called a "superinfection." Symptoms of such a bacterial infection may  include:  Worsening sore throat with pus and difficulty swallowing.  Swollen neck glands.  Chills and a high or persistent fever.  Severe headache.  Tenderness over the sinuses.  Persistent overall ill feeling (malaise), muscle aches, and tiredness (fatigue).  Persistent cough.  Yellow, green, or brown mucus production with coughing. HOME CARE INSTRUCTIONS   Only take over-the-counter or prescription medicines for pain, discomfort, diarrhea, or fever as directed by your caregiver.  Drink enough water and fluids to keep your urine clear or pale yellow. Sports drinks can provide valuable electrolytes, sugars, and hydration.  Get plenty of rest and maintain proper nutrition. Soups and broths with crackers or rice are fine. SEEK IMMEDIATE MEDICAL CARE IF:   You have severe headaches, shortness of breath, chest pain, neck pain, or an unusual rash.  You have uncontrolled vomiting, diarrhea, or you are unable to keep down fluids.  You or your child has an oral temperature above 102 F (38.9 C), not controlled by medicine.  Your baby is older than 3 months with a rectal temperature of 102 F (38.9 C) or higher.  Your baby is 31 months old or younger with a rectal temperature of 100.4 F (38 C) or higher. MAKE SURE YOU:   Understand these instructions.  Will watch your condition.  Will get help right away if you are not doing well or get worse. Document Released: 09/16/2005 Document Revised: 02/29/2012 Document Reviewed: 04/13/2011 [facility] Patient Information 2014 [place], [place].

## 2013-12-29 NOTE — ED Provider Notes (Signed)
CSN: [id number]     Arrival date & time 12/29/13  0307 History   First MD Initiated Contact with Patient 12/29/13 0315     Chief Complaint  Patient presents with  . Hyperglycemia   (Consider location/radiation/quality/duration/timing/severity/associated sxs/prior Treatment) HPI 63 yo female presents to the ER from home with complaint of body aches, diarrhea, nausea and dry heaves.  Sxs ongoing for the last 3 days.  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  She has been unable to take her medications due to illness.  No blood sugar checks for same problems.  No fevers, chills.  No cough, URI sxs.  No known sick contacts.  Past Medical History  Diagnosis Date  . Diabetes mellitus     diagnosed in 2000.    Marland Kitchen Hypertension   . Pulmonary embolism     2011 treated at [facility] in [place].  Was on Coumadin for over a year.  no known family history  . UTI (urinary tract infection)   . High cholesterol   . 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 Depression   . Neuropathy     feet   Past Surgical History  Procedure Laterality Date  . Abdominal hysterectomy    . Cholecystectomy      1980  . Esophagogastroduodenoscopy  08/19/2012    Procedure: ESOPHAGOGASTRODUODENOSCOPY (EGD);  Surgeon: Winfield Cunas., MD;  Location: [facility] ENDOSCOPY;  Service: Endoscopy;  Laterality: N/A;  . Cesarean section    . Hand surgery     Family History  Problem Relation Age of Onset  . Adopted: Yes   History  Substance Use Topics  . Smoking status: Current Every Day Smoker -- 0.05 packs/day for 10 years    Types: Cigarettes  . Smokeless tobacco: Never Used     Comment: Wants patches.  . Alcohol Use: No   OB History   Grav Para Term Preterm Abortions TAB SAB Ect Mult Living                 Review of Systems  All other systems reviewed and are negative.    Allergies  Keflex; Latex; Sulfa antibiotics; and Sulfur  Home Medications   Current  Outpatient Rx  Name  Route  Sig  Dispense  Refill  . amitriptyline (ELAVIL) 25 MG tablet      TAKE 2 TABLETS BY MOUTH EVERY NIGHT AT BEDTIME   30 tablet   2   . amLODipine (NORVASC) 10 MG tablet      TAKE 1 TABLET BY MOUTH EVERY DAY   60 tablet   3   . Blood Glucose Monitoring Suppl (ACCU-CHEK AVIVA PLUS) W/DEVICE KIT   Does not apply   1 kit by Does not apply route 3 (three) times daily. Please check blood sugar 3 to 4 times daily. diag code 250.0. Insulin dependent   1 kit   0   . Blood Pressure KIT   Does not apply   1 Units by Does not apply route daily.   1 each   0   . citalopram (CELEXA) 20 MG tablet      TAKE 1 TABLET BY MOUTH DAILY   60 tablet   3   . diazepam (VALIUM) 10 MG tablet   Oral   Take 1 tablet (10 mg total) by mouth once.   1 tablet   0     Take prior to  injection   . glucose blood (ACCU-CHEK AVIVA) test strip      Please check blood sugar 3 to 4 times daily. diag code 250.0. Insulin dependent   100 each   12   . ibuprofen (ADVIL,MOTRIN) 800 MG tablet   Oral   Take 1 tablet (800 mg total) by mouth daily as needed for moderate pain.   30 tablet   2   . ibuprofen (MOTRIN IB) 200 MG tablet   Oral   Take 4 tablets (800 mg total) by mouth every morning.   100 tablet   2   . insulin NPH-regular (NOVOLIN 70/30) (70-30) 100 UNIT/ML injection   Subcutaneous   Inject 30 Units into the skin 2 (two) times daily with a meal.   20 mL   11   . Insulin Syringes, Disposable, U-100 1 ML MISC   Does not apply   1 Units by Does not apply route 2 (two) times daily.   100 each   11   . Lancet Devices (ACCU-CHEK SOFTCLIX) lancets      Use to check blood sugars 3 to 4 times daily. Dx code: 250.00. Insulin dependent.   100 each   0     Code 250.6   . losartan-hydrochlorothiazide (HYZAAR) 100-25 MG per tablet   Oral   Take 1 tablet by mouth daily.   30 tablet   11   . omeprazole (PRILOSEC) 20 MG capsule      TAKE 1 CAPSULE BY MOUTH EVERY  DAY   60 capsule   2   . pravastatin (PRAVACHOL) 40 MG tablet   Oral   Take 1 tablet (40 mg total) by mouth every evening.   30 tablet   11   . warfarin (COUMADIN) 5 MG tablet      TAKE 2 AND 1/2 TABLETS BY MOUTH DAILY EXCEPT MONDAYS AND THURSDAYS, WHEN YOU TAKE 3 TABLETS BY MOUTH DAILY.   30 tablet   0    BP 152/85  Pulse 104  Temp(Src) 98.6 F (37 C) (Oral)  Resp 14  SpO2 97% Physical Exam  Nursing note and vitals reviewed. Constitutional: She is oriented to person, place, and time. She appears well-developed and well-nourished.  HENT:  Head: Normocephalic and atraumatic.  Nose: Nose normal.  Dry mucous membranes  Eyes: Conjunctivae and EOM are normal. Pupils are equal, round, and reactive to light.  Neck: Normal range of motion. Neck supple. No JVD present. No tracheal deviation present. No thyromegaly present.  Cardiovascular: Normal rate, regular rhythm, normal heart sounds and intact distal pulses.  Exam reveals no gallop and no friction rub.   No murmur heard. Pulmonary/Chest: Effort normal and breath sounds normal. No stridor. No respiratory distress. She has no wheezes. She has no rales. She exhibits no tenderness.  Abdominal: Soft. Bowel sounds are normal. She exhibits no distension and no mass. There is tenderness (diffuse mild). There is no rebound and no guarding.  Musculoskeletal: Normal range of motion. She exhibits no edema and no tenderness.  Lymphadenopathy:    She has no cervical adenopathy.  Neurological: She is alert and oriented to person, place, and time. She exhibits normal muscle tone. Coordination normal.  Skin: Skin is warm and dry. No rash noted. No erythema. No pallor.  Psychiatric: She has a normal mood and affect. Her behavior is normal. Judgment and thought content normal.    ED Course  Procedures (including critical care time) Labs Review Labs Reviewed  CBC WITH DIFFERENTIAL -  Abnormal; Notable for the following:    RBC 5.14 (*)     RDW 16.0 (*)    Monocytes Absolute 1.1 (*)    All other components within normal limits  COMPREHENSIVE METABOLIC PANEL - Abnormal; Notable for the following:    Glucose, Bld 391 (*)    Albumin 2.3 (*)    Alkaline Phosphatase 128 (*)    Total Bilirubin <0.2 (*)    GFR calc non Af Amer 53 (*)    GFR calc Af Amer 62 (*)    All other components within normal limits  URINALYSIS, ROUTINE W REFLEX MICROSCOPIC - Abnormal; Notable for the following:    Glucose, UA >1000 (*)    Hgb urine dipstick MODERATE (*)    Protein, ur >300 (*)    All other components within normal limits  GLUCOSE, CAPILLARY - Abnormal; Notable for the following:    Glucose-Capillary 363 (*)    All other components within normal limits  URINE MICROSCOPIC-ADD ON - Abnormal; Notable for the following:    Squamous Epithelial / LPF FEW (*)    Casts HYALINE CASTS (*)    All other components within normal limits  GLUCOSE, CAPILLARY - Abnormal; Notable for the following:    Glucose-Capillary 314 (*)    All other components within normal limits  PROTIME-INR   Imaging Review No results found.  EKG Interpretation   None       MDM   1. Viral syndrome   2. Nausea vomiting and diarrhea   3. Hyperglycemia    63 yo female with 3 days of myalgias, n/v/d.  Workup here unremarkable other than hyperglycemia.  Plan to d/c home with immodium, zofran, ultram, bentyl.  To f/u with her pcm early next week.    Kalman Drape, MD 12/29/13 [phone]

## 2013-12-29 NOTE — ED Notes (Signed)
Per ems-- pt reports discomfort in abdomen with diarrhea x 3 days also burning sensation to R side when she lays down. Also reports feeling shaky over last several days.pt  htn 196/110. cbg 468, sinus tach. 20 G L AC.

## 2014-01-02 ENCOUNTER — Other Ambulatory Visit: Payer: Self-pay | Admitting: Internal Medicine

## 2014-01-02 NOTE — Telephone Encounter (Signed)
Has not been seen in the anticoagulation clinic since late July.  Will refill X 1, but further refills require follow-up in the anticoagulation clinic to assure the warfarin dose is appropriate and in the "safe" range.  Please schedule him with Dr. Elie Confer at his next available appointment.

## 2014-01-15 ENCOUNTER — Ambulatory Visit (INDEPENDENT_AMBULATORY_CARE_PROVIDER_SITE_OTHER): Payer: Medicare Other | Admitting: Pharmacist

## 2014-01-15 DIAGNOSIS — Z7901 Long term (current) use of anticoagulants: Secondary | ICD-10-CM | POA: Diagnosis not present

## 2014-01-15 DIAGNOSIS — F329 Major depressive disorder, single episode, unspecified: Secondary | ICD-10-CM | POA: Diagnosis not present

## 2014-01-15 DIAGNOSIS — G8929 Other chronic pain: Secondary | ICD-10-CM | POA: Diagnosis not present

## 2014-01-15 DIAGNOSIS — I2699 Other pulmonary embolism without acute cor pulmonale: Secondary | ICD-10-CM | POA: Diagnosis not present

## 2014-01-15 DIAGNOSIS — F3289 Other specified depressive episodes: Secondary | ICD-10-CM | POA: Diagnosis not present

## 2014-01-15 DIAGNOSIS — M549 Dorsalgia, unspecified: Secondary | ICD-10-CM | POA: Diagnosis not present

## 2014-01-15 DIAGNOSIS — E119 Type 2 diabetes mellitus without complications: Secondary | ICD-10-CM | POA: Diagnosis not present

## 2014-01-15 LAB — POCT INR: INR: 1

## 2014-01-16 NOTE — Patient Instructions (Signed)
Patient instructed to take medications as defined in the Anti-coagulation Track section of this encounter.  Patient instructed to take today's dose.  Patient verbalized understanding of these instructions.    

## 2014-01-16 NOTE — Progress Notes (Signed)
Anti-Coagulation Progress Note  Joy Butler is a 63 y.o. female who is currently on an anti-coagulation regimen.    RECENT RESULTS: Recent results are below, the most recent result is correlated with a dose of having been OFF of warfarin > 1 month. Warfarin will be reinitiated without bridge therapy because her most recent VTE > 30 days--as per recommendation cited in literature by Adele Schilder from [facility] [place] published in medical literature. Lab Results  Component Value Date   INR 1.0 01/15/2014   INR 0.89 12/29/2013   INR 4.30 07/17/2013    ANTI-COAG DOSE: Anticoagulation Dose Instructions as of 01/15/2014     Dorene Grebe Tue Wed Thu Fri Sat   New Dose 12.5 mg 15 mg 12.5 mg 15 mg 12.5 mg 15 mg 12.5 mg       ANTICOAG SUMMARY: Anticoagulation Episode Summary   Current INR goal 2.0-3.0  Next INR check 01/22/2014  INR from last check 1.0! 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 Duration of therapy should be indefinite but with periodic assessment and advice and consent of the patient insofar as continuing indefiite warfarin therapy as per guidelines published in supplement to Journal CHEST.        ANTICOAG TODAY: Anticoagulation Summary as of 01/15/2014   INR goal 2.0-3.0  Selected INR 1.0! 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 Duration of therapy should be indefinite but with periodic assessment and advice and consent of the patient insofar as continuing indefiite warfarin therapy as per guidelines published in supplement to Journal CHEST.      PATIENT INSTRUCTIONS: Patient Instructions  Patient instructed to take medications as defined in the Anti-coagulation Track section of this encounter.  Patient instructed to take today's dose.  Patient verbalized understanding of these instructions.       FOLLOW-UP Return in 7 days (on 01/22/2014) for Follow up INR at 2:00PM.  Jorene Guest, III Pharm.D., CACP

## 2014-01-22 ENCOUNTER — Ambulatory Visit: Payer: Medicare Other

## 2014-01-30 ENCOUNTER — Other Ambulatory Visit: Payer: Self-pay | Admitting: Internal Medicine

## 2014-02-07 ENCOUNTER — Encounter: Payer: Self-pay | Admitting: Internal Medicine

## 2014-02-07 ENCOUNTER — Ambulatory Visit (INDEPENDENT_AMBULATORY_CARE_PROVIDER_SITE_OTHER): Payer: Medicare Other | Admitting: Internal Medicine

## 2014-02-07 VITALS — BP 146/91 | HR 90 | Temp 97.4°F | Wt 256.4 lb

## 2014-02-07 DIAGNOSIS — I2699 Other pulmonary embolism without acute cor pulmonale: Secondary | ICD-10-CM | POA: Diagnosis not present

## 2014-02-07 DIAGNOSIS — L0390 Cellulitis, unspecified: Secondary | ICD-10-CM | POA: Diagnosis not present

## 2014-02-07 DIAGNOSIS — G8929 Other chronic pain: Secondary | ICD-10-CM

## 2014-02-07 DIAGNOSIS — Z7901 Long term (current) use of anticoagulants: Secondary | ICD-10-CM

## 2014-02-07 DIAGNOSIS — E119 Type 2 diabetes mellitus without complications: Secondary | ICD-10-CM | POA: Diagnosis not present

## 2014-02-07 DIAGNOSIS — E1142 Type 2 diabetes mellitus with diabetic polyneuropathy: Secondary | ICD-10-CM

## 2014-02-07 DIAGNOSIS — D892 Hypergammaglobulinemia, unspecified: Secondary | ICD-10-CM

## 2014-02-07 DIAGNOSIS — L299 Pruritus, unspecified: Secondary | ICD-10-CM

## 2014-02-07 DIAGNOSIS — L0291 Cutaneous abscess, unspecified: Secondary | ICD-10-CM | POA: Diagnosis not present

## 2014-02-07 DIAGNOSIS — F32A Depression, unspecified: Secondary | ICD-10-CM

## 2014-02-07 DIAGNOSIS — F329 Major depressive disorder, single episode, unspecified: Secondary | ICD-10-CM

## 2014-02-07 DIAGNOSIS — M549 Dorsalgia, unspecified: Secondary | ICD-10-CM

## 2014-02-07 DIAGNOSIS — E7800 Pure hypercholesterolemia, unspecified: Secondary | ICD-10-CM | POA: Diagnosis not present

## 2014-02-07 DIAGNOSIS — I10 Essential (primary) hypertension: Secondary | ICD-10-CM

## 2014-02-07 DIAGNOSIS — E1149 Type 2 diabetes mellitus with other diabetic neurological complication: Secondary | ICD-10-CM

## 2014-02-07 DIAGNOSIS — J069 Acute upper respiratory infection, unspecified: Secondary | ICD-10-CM | POA: Diagnosis not present

## 2014-02-07 DIAGNOSIS — F3289 Other specified depressive episodes: Secondary | ICD-10-CM

## 2014-02-07 DIAGNOSIS — W19XXXA Unspecified fall, initial encounter: Secondary | ICD-10-CM

## 2014-02-07 DIAGNOSIS — Z0000 Encounter for general adult medical examination without abnormal findings: Secondary | ICD-10-CM

## 2014-02-07 DIAGNOSIS — R21 Rash and other nonspecific skin eruption: Secondary | ICD-10-CM

## 2014-02-07 LAB — POCT GLYCOSYLATED HEMOGLOBIN (HGB A1C): HEMOGLOBIN A1C: 13.7

## 2014-02-07 LAB — GLUCOSE, CAPILLARY: Glucose-Capillary: 364 mg/dL — ABNORMAL HIGH (ref 70–99)

## 2014-02-07 MED ORDER — DIPHENHYDRAMINE-ZINC ACETATE 1-0.1 % EX CREA: TOPICAL_CREAM | Freq: Three times a day (TID) | CUTANEOUS | Status: DC | PRN

## 2014-02-07 MED ORDER — INSULIN NPH ISOPHANE & REGULAR (70-30) 100 UNIT/ML ~~LOC~~ SUSP: 36.0000 [IU] | Freq: Two times a day (BID) | SUBCUTANEOUS | Status: DC

## 2014-02-07 MED ORDER — GABAPENTIN 300 MG PO CAPS: 300.0000 mg | ORAL_CAPSULE | Freq: Three times a day (TID) | ORAL | Status: DC

## 2014-02-07 NOTE — Patient Instructions (Signed)
General Instructions: Please stop taking Elavil and instead starting taking Neurontin as  Take Neurontin 300 mg three times a day (starting with Day 1: 300 mg, Day 2: 300 mg twice daily, Day 3: 300 mg 3 times daily) I will arrange for your home health physical therapy I will arrange for your to be seen again by [facility] Please increased your insulin to 36 units twice a day Please check you blood sugar every before each meal and come back in two weeks with your meter You may use benadryl cream for your itching  We will order some blood checks today and I will call you with results  Please comeback in two weeks.     Treatment Goals:  Goals (1 Years of Data) as of 02/07/14         As of Today 12/29/13 12/29/13 12/29/13 12/29/13     Blood Pressure    . Blood Pressure < 120/80  146/91 146/86 155/91 146/76 164/96     Result Component    . HEMOGLOBIN A1C < 7.0  13.7        . LDL CALC < 70            Progress Toward Treatment Goals:  Treatment Goal 02/07/2014  Hemoglobin A1C deteriorated  Blood pressure at goal  Stop smoking smoking the same amount  Prevent falls deteriorated    Self Care Goals & Plans:  Self Care Goal 02/07/2014  Manage my medications take my medicines as prescribed; bring my medications to every visit; refill my medications on time  Monitor my health -  Eat healthy foods -  Be physically active -  Stop smoking -    Home Blood Glucose Monitoring 02/07/2014  Check my blood sugar 5 times a day  When to check my blood sugar before breakfast; before lunch; before dinner; before meals; at bedtime     Care Management & Community Referrals:  Referral 02/17/2013  Referrals made for care management support [facility] case management program

## 2014-02-08 LAB — MICROALBUMIN / CREATININE URINE RATIO
CREATININE, URINE: 129.4 mg/dL
Microalb Creat Ratio: 3941 mg/g — ABNORMAL HIGH (ref 0.0–30.0)
Microalb, Ur: 509.96 mg/dL — ABNORMAL HIGH (ref 0.00–1.89)

## 2014-02-09 ENCOUNTER — Telehealth: Payer: Self-pay | Admitting: Licensed Clinical Social Worker

## 2014-02-09 DIAGNOSIS — R21 Rash and other nonspecific skin eruption: Secondary | ICD-10-CM | POA: Insufficient documentation

## 2014-02-09 LAB — SPEP & IFE WITH QIG
Albumin ELP: 45.8 % — ABNORMAL LOW (ref 55.8–66.1)
Alpha-1-Globulin: 4.9 % (ref 2.9–4.9)
Alpha-2-Globulin: 17.3 % — ABNORMAL HIGH (ref 7.1–11.8)
Beta 2: 10.1 % — ABNORMAL HIGH (ref 3.2–6.5)
Beta Globulin: 6.7 % (ref 4.7–7.2)
Gamma Globulin: 15.2 % (ref 11.1–18.8)
IgA: 703 mg/dL — ABNORMAL HIGH (ref 69–380)
IgG (Immunoglobin G), Serum: 1090 mg/dL (ref 690–1700)
IgM, Serum: 160 mg/dL (ref 52–322)
Total Protein, Serum Electrophoresis: 6.7 g/dL (ref 6.0–8.3)

## 2014-02-09 LAB — PROTEIN ELECTROPHORESIS, URINE REFLEX: Total Protein, Urine: 4 mg/dL

## 2014-02-09 NOTE — Telephone Encounter (Signed)
Joy Butler was referred to CSW to resume [facility] services.  Pt reports a recent move and lost contact with her home health services.  CSW placed called to pt.  CSW left message requesting return call. CSW provided contact hours and phone number.  CSW requesting when pt moved and what agency pt was linked with.  Pt may need new order and [facility].

## 2014-02-09 NOTE — Assessment & Plan Note (Signed)
She continues to complain of pain in both lower extremities. She's been on Elavil 25 mg at bedtime with poor results. I have switched her to Neurontin 300 mg 3 times a day. This might have the benefit of providing some relief for the back pain as well.

## 2014-02-09 NOTE — Assessment & Plan Note (Signed)
Lab Results  Component Value Date   HGBA1C 13.7 02/07/2014   HGBA1C 10.4 08/14/2013   HGBA1C 10.3 04/19/2013     Assessment: Diabetes control: poor control (HgbA1C >9%) Progress toward A1C goal:  deteriorated Comments: non-compliance, fears pricks therefore does not check home glucose  Plan: Medications:  Increase insulin NPH 70/30 from 30 units to 36 units twice a day. Home glucose monitoring: Frequency: 5 times a day Timing: before breakfast;before lunch;before dinner;before meals;at bedtime Instruction/counseling given: reminded to get eye exam, reminded to bring blood glucose meter & log to each visit, reminded to bring medications to each visit, discussed foot care, discussed the need for weight loss and discussed diet Educational resources provided:   Self management tools provided:   Other plans: will see Butch Penny on her next visit -Performed Foot Exam I will consider addition of Metformin since patient has a long term hx of noncompliance with insulin. Metformin may not be able to adequately control her diabetes but, it will help bring down A1c some. - Follow up in 2 weeks with glucose meter. I encouraged her to check at least 3 times a day in order to adjust her insulin during her next visit.

## 2014-02-09 NOTE — Assessment & Plan Note (Signed)
She reports that the symptoms are well-controlled on her current dose of Celexa. Will continue with 20 mg

## 2014-02-09 NOTE — Assessment & Plan Note (Signed)
BP Readings from Last 3 Encounters:  02/07/14 146/91  12/29/13 146/86  09/26/13 122/68    Lab Results  Component Value Date   NA 139 12/29/2013   K 3.7 12/29/2013   CREATININE 1.08 12/29/2013    Assessment: Blood pressure control: controlled Progress toward BP goal:  at goal Comments:   Plan: Medications:  continue current medications Educational resources provided:   Self management tools provided:   Other plans: follow up in 2 weeks

## 2014-02-09 NOTE — Assessment & Plan Note (Signed)
Patient continued to refuse all the vaccinations.

## 2014-02-09 NOTE — Assessment & Plan Note (Signed)
Patient was evaluated in the pain clinic Dr. Read Drivers back in 09/2013 who recommended but the injections and increased Tylenol dose. However, the patient rejected this plan since she would not be sedated for the procedure. She has not followed up with him since then. She requests for my referral to another doctor in [place] can sedate her for back injection as her former pain physician in [place].   Plan. - Advised the patient to make a self referral back to her pain physician in [place], otherwise to follow up with Dr Read Drivers - Did not change her pain meds - I will send a message to the social worker arranged home health PT, at her new address. - she will also need HHRN - I will continue to follow

## 2014-02-09 NOTE — Progress Notes (Signed)
Patient ID: Joy Butler, female   DOB: April 15, 1951, 63 y.o.   MRN: [id number]   Subjective:   HPI: Joy Butler is a 63 y.o. woman with past medical history of uncontrolled diabetes, hypertension, history of pulmonary embolism on chronic Coumadin therapy, presents to the clinic for routine follow up.  Patient has lots of chronic complaints including low back pain, lower extremity pain and weakness. She is also complaining of a rash on both her arm which started two weeks ago.  She reports that the rash is itchy, but denies fevers, nausea, or vomiting. No arthralgias. She denies insect bites. No new medications. She doesn't suspect any contact with irritants. No new cosmetics. No other household members with a similar rash. This is the first time she has ever had this kind of rash. She has not tried anything at home to treat the rash.  She also informs me that she recently changed her address and therefore, lost contact with most of her home health services including home health PT, and RN. She also [facility] She is still interested with the services and requests me to assist.   She denies recent fatigue, nausea, vomiting. No recent falls.  DM2, uncontrolled -  Lab Results  Component Value Date   HGBA1C 13.7 02/07/2014   She does not check her blood sugar due to fear of needle pricks. She also tells me that she has been missing some doses of her insulin due to poor by mouth intake associated with lack of appetite. Currently taking NPH-regular 70/30 30 units twice a day. Patient misses doses 4-5 x per week on average.  0 hypoglycemic episodes since last visit. denies polyuria, polydipsia, nausea, vomiting, diarrhea.  does request refills today.  In regards to diabetic complications:  Microvascular complications: Confirms: peripheral neuropathy ;    Important diabetic medications: Is patient on aspirin? No Is patient on a statin? Yes Is patient  on an ACE-I/ ARB? Yes  Kindly see the A&P for the status of the pt's chronic medical problems.   Past Medical History  Diagnosis Date  . Diabetes mellitus     diagnosed in 2000.    Marland Kitchen Hypertension   . Pulmonary embolism     2011 treated at [facility] in [place].  Was on Coumadin for over a year.  no known family history  . UTI (urinary tract infection)   . High cholesterol   . 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 Depression   . Neuropathy     feet   Current Outpatient Prescriptions  Medication Sig Dispense Refill  . amLODipine (NORVASC) 10 MG tablet TAKE 1 TABLET BY MOUTH EVERY DAY  60 tablet  3  . Blood Glucose Monitoring Suppl (ACCU-CHEK AVIVA PLUS) W/DEVICE KIT 1 kit by Does not apply route 3 (three) times daily. Please check blood sugar 3 to 4 times daily. diag code 250.0. Insulin dependent  1 kit  0  . Blood Pressure KIT 1 Units by Does not apply route daily.  1 each  0  . citalopram (CELEXA) 20 MG tablet TAKE 1 TABLET BY MOUTH DAILY  60 tablet  3  . diphenhydrAMINE-zinc acetate (BENADRYL) cream Apply topically 3 (three) times daily as needed for itching.  28.3 g  0  . gabapentin (NEURONTIN) 300 MG capsule Take 1 capsule (300 mg total) by mouth 3 (three) times daily.  90 capsule  2  . glucose  blood (ACCU-CHEK AVIVA) test strip Please check blood sugar 3 to 4 times daily. diag code 250.0. Insulin dependent  100 each  12  . ibuprofen (ADVIL,MOTRIN) 800 MG tablet TAKE 1 TABLET BY MOUTH EVERY DAY AS NEEDED FOR PAIN  30 tablet  6  . insulin NPH-regular Human (NOVOLIN 70/30) (70-30) 100 UNIT/ML injection Inject 36 Units into the skin 2 (two) times daily with a meal.  20 mL  11  . Insulin Syringes, Disposable, U-100 1 ML MISC 1 Units by Does not apply route 2 (two) times daily.  100 each  11  . Lancet Devices (ACCU-CHEK SOFTCLIX) lancets Use to check blood sugars 3 to 4 times daily. Dx code: 250.00. Insulin dependent.  100 each  0  . losartan-hydrochlorothiazide (HYZAAR) 100-25  MG per tablet Take 1 tablet by mouth daily.  30 tablet  11  . warfarin (COUMADIN) 5 MG tablet TAKE 2 AND 1/2 TABLETS BY MOUTH DAILY EXCEPT MONDAYS AND THURSDAYS, WHEN YOU TAKE 3 TABLETS BY MOUTH DAILY.  30 tablet  2   No current facility-administered medications for this visit.   Family History  Problem Relation Age of Onset  . Adopted: Yes   History   Social History  . Marital Status: Single    Spouse Name: N/A    Number of Children: N/A  . Years of Education: N/A   Social History Main Topics  . Smoking status: Current Every Day Smoker -- 0.05 packs/day for 10 years    Types: Cigarettes  . Smokeless tobacco: Never Used     Comment: Wants patches.  . Alcohol Use: No  . Drug Use: No  . Sexual Activity: Not Currently   Other Topics Concern  . None   Social History Narrative   Previously divorced now engaged with her partner of 4 years.  Has 6 grown children with 21 grandchildren.  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  Moved to [place] in August 2013 and was transiently homeless until first part of October 2013.     Review of Systems: Constitutional: Denies fever, chills, diaphoresis, appetite change and fatigue.  Respiratory: Denies SOB, DOE, cough, chest tightness, and wheezing.  Cardiovascular: No chest pain, palpitations and leg swelling.  Gastrointestinal: No abdominal pain, nausea, vomiting, bloody stools Genitourinary: No dysuria, frequency, hematuria, or flank pain.  Musculoskeletal:  chronic back pain, and pain in her extremities associated with weakness.  Psych: Reports significant improvement in her depression symptoms with Celexa.  Objective:  Physical Exam: Filed Vitals:   02/07/14 1442  BP: 146/91  Pulse: 90  Temp: 97.4 F (36.3 C)  TempSrc: Oral  Weight: 256 lb 6.4 oz (116.302 kg)  SpO2: 100%   General: Well nourished. No acute distress. Daughter in exam room.  Lungs: CTA bilaterally. Heart: RRR; no extra  sounds or murmurs  Abdomen: Non-distended, normal BS, soft, nontender; no hepatosplenomegaly  Extremities: No pedal edema. No joint swelling or tenderness. Upper extremities: Sparse maculopapular rash on the anterior aspects of her forearms. Neurologic: Alert and oriented x3. No obvious neurologic deficits.  Assessment & Plan:  I have discussed my assessment and plan  with Dr. Lynnae January  as detailed under problem based charting.

## 2014-02-09 NOTE — Assessment & Plan Note (Signed)
Patient continues to have falls due to weakness in her lower extremities. Previously she was on home PT, but after change of her address she lost contact with her therapist. We will reestablish this.

## 2014-02-09 NOTE — Assessment & Plan Note (Signed)
She is sub therapeutic on her warfarin. She is noncompliant. She follow up infrequently with Dr Elie Confer.

## 2014-02-12 ENCOUNTER — Other Ambulatory Visit: Payer: Self-pay | Admitting: Licensed Clinical Social Worker

## 2014-02-12 DIAGNOSIS — E119 Type 2 diabetes mellitus without complications: Secondary | ICD-10-CM

## 2014-02-12 NOTE — Telephone Encounter (Signed)
CSW placed call to [name].  Pt states she has not recv'd home health services since June 2014.  Ms. Knoles chart shows previous agency was [facility], pt in agreement for referral to be sent to [facility]  Discussed with Ms. Reznick will need PCP to place new order for services since it has been several months.  CSW sent email to pt's previous [facility] worker with updated information.  Pt may need to [facility] c/s placed.  Request sent to PCP.

## 2014-02-12 NOTE — Progress Notes (Signed)
Case discussed with Dr. Kazibwe soon after the resident saw the patient.  We reviewed the resident's history and exam and pertinent patient test results.  I agree with the assessment, diagnosis, and plan of care documented in the resident's note. 

## 2014-02-14 NOTE — Telephone Encounter (Signed)
Received confirmation pt is linked back with [facility] and assigned to Thea Silversmith.

## 2014-02-16 ENCOUNTER — Other Ambulatory Visit: Payer: Self-pay | Admitting: Internal Medicine

## 2014-02-16 DIAGNOSIS — G8929 Other chronic pain: Secondary | ICD-10-CM

## 2014-02-16 DIAGNOSIS — E1142 Type 2 diabetes mellitus with diabetic polyneuropathy: Secondary | ICD-10-CM

## 2014-02-16 DIAGNOSIS — M549 Dorsalgia, unspecified: Secondary | ICD-10-CM

## 2014-02-20 NOTE — Telephone Encounter (Signed)
[facility] notified order on chart.  Pt resumed with [facility] care manager.

## 2014-02-21 ENCOUNTER — Emergency Department (HOSPITAL_COMMUNITY)
Admission: EM | Admit: 2014-02-21 | Discharge: 2014-02-22 | Disposition: A | Discharge status: Home / Self Care | Payer: Medicare Other | Attending: Emergency Medicine | Admitting: Emergency Medicine

## 2014-02-21 ENCOUNTER — Encounter: Payer: Medicare Other | Admitting: Internal Medicine

## 2014-02-21 ENCOUNTER — Encounter (HOSPITAL_COMMUNITY): Payer: Self-pay | Admitting: Emergency Medicine

## 2014-02-21 DIAGNOSIS — I10 Essential (primary) hypertension: Secondary | ICD-10-CM | POA: Insufficient documentation

## 2014-02-21 DIAGNOSIS — Z862 Personal history of diseases of the blood and blood-forming organs and certain disorders involving the immune mechanism: Secondary | ICD-10-CM | POA: Diagnosis not present

## 2014-02-21 DIAGNOSIS — Z79899 Other long term (current) drug therapy: Secondary | ICD-10-CM | POA: Insufficient documentation

## 2014-02-21 DIAGNOSIS — E1142 Type 2 diabetes mellitus with diabetic polyneuropathy: Secondary | ICD-10-CM | POA: Diagnosis not present

## 2014-02-21 DIAGNOSIS — Z87440 Personal history of urinary (tract) infections: Secondary | ICD-10-CM | POA: Insufficient documentation

## 2014-02-21 DIAGNOSIS — F17200 Nicotine dependence, unspecified, uncomplicated: Secondary | ICD-10-CM | POA: Insufficient documentation

## 2014-02-21 DIAGNOSIS — IMO0001 Reserved for inherently not codable concepts without codable children: Secondary | ICD-10-CM | POA: Insufficient documentation

## 2014-02-21 DIAGNOSIS — Z794 Long term (current) use of insulin: Secondary | ICD-10-CM | POA: Insufficient documentation

## 2014-02-21 DIAGNOSIS — Z86711 Personal history of pulmonary embolism: Secondary | ICD-10-CM | POA: Diagnosis not present

## 2014-02-21 DIAGNOSIS — F329 Major depressive disorder, single episode, unspecified: Secondary | ICD-10-CM | POA: Diagnosis not present

## 2014-02-21 DIAGNOSIS — E1140 Type 2 diabetes mellitus with diabetic neuropathy, unspecified: Secondary | ICD-10-CM

## 2014-02-21 DIAGNOSIS — Z91040 Latex allergy status: Secondary | ICD-10-CM | POA: Insufficient documentation

## 2014-02-21 DIAGNOSIS — F3289 Other specified depressive episodes: Secondary | ICD-10-CM | POA: Diagnosis not present

## 2014-02-21 DIAGNOSIS — Z7901 Long term (current) use of anticoagulants: Secondary | ICD-10-CM | POA: Insufficient documentation

## 2014-02-21 DIAGNOSIS — G589 Mononeuropathy, unspecified: Secondary | ICD-10-CM | POA: Insufficient documentation

## 2014-02-21 DIAGNOSIS — E1149 Type 2 diabetes mellitus with other diabetic neurological complication: Secondary | ICD-10-CM | POA: Insufficient documentation

## 2014-02-21 LAB — BASIC METABOLIC PANEL
BUN: 11 mg/dL (ref 6–23)
CHLORIDE: 102 meq/L (ref 96–112)
CO2: 27 mEq/L (ref 19–32)
Calcium: 9 mg/dL (ref 8.4–10.5)
Creatinine, Ser: 1.09 mg/dL (ref 0.50–1.10)
GFR calc Af Amer: 61 mL/min — ABNORMAL LOW (ref >90)
GFR calc non Af Amer: 52 mL/min — ABNORMAL LOW (ref >90)
GLUCOSE: 243 mg/dL — AB (ref 70–99)
Potassium: 3.9 mEq/L (ref 3.7–5.3)
Sodium: 142 mEq/L (ref 137–147)

## 2014-02-21 LAB — CBG MONITORING, ED
GLUCOSE-CAPILLARY: 236 mg/dL — AB (ref 70–99)
Glucose-Capillary: 275 mg/dL — ABNORMAL HIGH (ref 70–99)

## 2014-02-21 LAB — CBC WITH DIFFERENTIAL/PLATELET
Basophils Absolute: 0 10*3/uL (ref 0.0–0.1)
Basophils Relative: 0 % (ref 0–1)
EOS ABS: 0.3 10*3/uL (ref 0.0–0.7)
Eosinophils Relative: 2 % (ref 0–5)
HEMATOCRIT: 36.8 % (ref 36.0–46.0)
HEMOGLOBIN: 12.2 g/dL (ref 12.0–15.0)
LYMPHS ABS: 2.3 10*3/uL (ref 0.7–4.0)
Lymphocytes Relative: 21 % (ref 12–46)
MCH: 27.9 pg (ref 26.0–34.0)
MCHC: 33.2 g/dL (ref 30.0–36.0)
MCV: 84.2 fL (ref 78.0–100.0)
MONOS PCT: 6 % (ref 3–12)
Monocytes Absolute: 0.7 10*3/uL (ref 0.1–1.0)
NEUTROS ABS: 8 10*3/uL — AB (ref 1.7–7.7)
NEUTROS PCT: 70 % (ref 43–77)
Platelets: 350 10*3/uL (ref 150–400)
RBC: 4.37 MIL/uL (ref 3.87–5.11)
RDW: 16.4 % — ABNORMAL HIGH (ref 11.5–15.5)
WBC: 11.4 10*3/uL — ABNORMAL HIGH (ref 4.0–10.5)

## 2014-02-21 MED ORDER — MORPHINE SULFATE 4 MG/ML IJ SOLN
4.0000 mg | Freq: Once | INTRAMUSCULAR | Status: AC
  Administered 2014-02-21: 4 mg via INTRAVENOUS
  Filled 2014-02-21: qty 1

## 2014-02-21 MED ORDER — INSULIN ASPART PROT & ASPART (70-30 MIX) 100 UNIT/ML ~~LOC~~ SUSP
10.0000 [IU] | Freq: Once | SUBCUTANEOUS | Status: AC
  Administered 2014-02-21: 10 [IU] via SUBCUTANEOUS
  Filled 2014-02-21: qty 10

## 2014-02-21 MED ORDER — SODIUM CHLORIDE 0.9 % IV BOLUS (SEPSIS)
1000.0000 mL | Freq: Once | INTRAVENOUS | Status: AC
  Administered 2014-02-21: 1000 mL via INTRAVENOUS

## 2014-02-21 MED ORDER — HYDROCODONE-ACETAMINOPHEN 5-325 MG PO TABS: 2.0000 | ORAL_TABLET | ORAL | Status: DC | PRN

## 2014-02-21 MED ORDER — ONDANSETRON HCL 4 MG/2ML IJ SOLN
4.0000 mg | Freq: Once | INTRAMUSCULAR | Status: AC
  Administered 2014-02-21: 4 mg via INTRAVENOUS
  Filled 2014-02-21: qty 2

## 2014-02-21 NOTE — ED Provider Notes (Signed)
CSN: [id number]     Arrival date & time 02/21/14  1611 History   First MD Initiated Contact with Patient 02/21/14 1837     Chief Complaint  Patient presents with  . Leg Pain     (Consider location/radiation/quality/duration/timing/severity/associated sxs/prior Treatment) HPI Comments: Patient is a 63 year old female with a past medical history of diabetes, hypertension, and diabetic neuropathy who presents with bilateral lower extremity pain. Symptoms started gradually 1 week ago and are unchanged. The pain starts below her knees and is located over her bilateral lower legs and feet. The pain feels like she is "being stabbed with knives" and reports severe pain. Patient reports her PCP started her on Neurontin 1 week ago but "it's not doing anything." No aggravating/alleviating factors. No other associated symptoms.    Past Medical History  Diagnosis Date  . Diabetes mellitus     diagnosed in 2000.    Marland Kitchen Hypertension   . Pulmonary embolism     2011 treated at [facility] in [place].  Was on Coumadin for over a year.  no known family history  . UTI (urinary tract infection)   . High cholesterol   . 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 Depression   . Neuropathy     feet   Past Surgical History  Procedure Laterality Date  . Abdominal hysterectomy    . Cholecystectomy      1980  . Esophagogastroduodenoscopy  08/19/2012    Procedure: ESOPHAGOGASTRODUODENOSCOPY (EGD);  Surgeon: Winfield Cunas., MD;  Location: [facility] ENDOSCOPY;  Service: Endoscopy;  Laterality: N/A;  . Cesarean section    . Hand surgery     Family History  Problem Relation Age of Onset  . Adopted: Yes   History  Substance Use Topics  . Smoking status: Current Every Day Smoker -- 0.05 packs/day for 10 years    Types: Cigarettes  . Smokeless tobacco: Never Used     Comment: Wants patches.  . Alcohol Use: No   OB History   Grav Para Term Preterm Abortions TAB SAB Ect Mult Living                  Review of Systems  Constitutional: Negative for fever, chills and fatigue.  HENT: Negative for trouble swallowing.   Eyes: Negative for visual disturbance.  Respiratory: Negative for shortness of breath.   Cardiovascular: Negative for chest pain and palpitations.  Gastrointestinal: Negative for nausea, vomiting, abdominal pain and diarrhea.  Genitourinary: Negative for dysuria and difficulty urinating.  Musculoskeletal: Positive for myalgias. Negative for arthralgias and neck pain.  Skin: Negative for color change.  Neurological: Negative for dizziness and weakness.  Psychiatric/Behavioral: Negative for dysphoric mood.      Allergies  Keflex; Latex; Sulfa antibiotics; and Sulfur  Home Medications   Current Outpatient Rx  Name  Route  Sig  Dispense  Refill  . amLODipine (NORVASC) 10 MG tablet      TAKE 1 TABLET BY MOUTH EVERY DAY   60 tablet   3   . Blood Glucose Monitoring Suppl (ACCU-CHEK AVIVA PLUS) W/DEVICE KIT   Does not apply   1 kit by Does not apply route 3 (three) times daily. Please check blood sugar 3 to 4 times daily. diag code 250.0. Insulin dependent   1 kit   0   . Blood Pressure KIT   Does not apply   1 Units by Does not apply route daily.   1  each   0   . citalopram (CELEXA) 20 MG tablet      TAKE 1 TABLET BY MOUTH DAILY   60 tablet   3   . diphenhydrAMINE-zinc acetate (BENADRYL) cream   Topical   Apply topically 3 (three) times daily as needed for itching.   28.3 g   0   . gabapentin (NEURONTIN) 300 MG capsule   Oral   Take 1 capsule (300 mg total) by mouth 3 (three) times daily.   90 capsule   2     Day 1: 300 mg (at bedtime), Day 2: 300 mg twice da ...   . glucose blood (ACCU-CHEK AVIVA) test strip      Please check blood sugar 3 to 4 times daily. diag code 250.0. Insulin dependent   100 each   12   . ibuprofen (ADVIL,MOTRIN) 800 MG tablet      TAKE 1 TABLET BY MOUTH EVERY DAY AS NEEDED FOR PAIN   30 tablet   6   .  insulin NPH-regular Human (NOVOLIN 70/30) (70-30) 100 UNIT/ML injection   Subcutaneous   Inject 36 Units into the skin 2 (two) times daily with a meal.   20 mL   11   . Insulin Syringes, Disposable, U-100 1 ML MISC   Does not apply   1 Units by Does not apply route 2 (two) times daily.   100 each   11   . Lancet Devices (ACCU-CHEK SOFTCLIX) lancets      Use to check blood sugars 3 to 4 times daily. Dx code: 250.00. Insulin dependent.   100 each   0     Code 250.6   . losartan-hydrochlorothiazide (HYZAAR) 100-25 MG per tablet   Oral   Take 1 tablet by mouth daily.   30 tablet   11   . warfarin (COUMADIN) 5 MG tablet   Oral   Take 12.5-15 mg by mouth See admin instructions. Patient takes ( 2.5 tablets) 12.5 mg on all days excepet  on Monday and Thursday patient takes 3 tablets for 15 mg total dose.          BP 129/73  Pulse 85  Temp(Src) 98.3 F (36.8 C) (Oral)  Resp 18  Ht 5' 9"  (1.753 m)  Wt 240 lb (108.863 kg)  BMI 35.43 kg/m2  SpO2 97% Physical Exam  Nursing note and vitals reviewed. Constitutional: She is oriented to person, place, and time. She appears well-developed and well-nourished. No distress.  HENT:  Head: Normocephalic and atraumatic.  Eyes: Conjunctivae and EOM are normal.  Neck: Normal range of motion.  Cardiovascular: Normal rate and regular rhythm.  Exam reveals no gallop and no friction rub.   No murmur heard. Pulmonary/Chest: Effort normal and breath sounds normal. She has no wheezes. She has no rales. She exhibits no tenderness.  Abdominal: Soft. She exhibits no distension. There is no tenderness. There is no rebound and no guarding.  Musculoskeletal: Normal range of motion.  Neurological: She is alert and oriented to person, place, and time. Coordination normal.  Lower extremity strength and sensation equal and intact bilaterally. Speech is goal-oriented. Moves limbs without ataxia.   Skin: Skin is warm and dry.  Psychiatric: She has a  normal mood and affect. Her behavior is normal.    ED Course  Procedures (including critical care time) Labs Review Labs Reviewed  CBC WITH DIFFERENTIAL - Abnormal; Notable for the following:    WBC 11.4 (*)    RDW  16.4 (*)    Neutro Abs 8.0 (*)    All other components within normal limits  BASIC METABOLIC PANEL - Abnormal; Notable for the following:    Glucose, Bld 243 (*)    GFR calc non Af Amer 52 (*)    GFR calc Af Amer 61 (*)    All other components within normal limits  CBG MONITORING, ED - Abnormal; Notable for the following:    Glucose-Capillary 275 (*)    All other components within normal limits  CBG MONITORING, ED - Abnormal; Notable for the following:    Glucose-Capillary 236 (*)    All other components within normal limits   Imaging Review No results found.   EKG Interpretation None      MDM   Final diagnoses:  Diabetic neuropathy, painful    6:58 PM Patient likely has diabetic neuropathy causing her pain. Patient's glucose is 275 here. Patient will have fluids, morphine and zofran for symptoms. Vitals stable and patient afebrile.   11:37 PM Patient's glucose is now 236. Patient will have more pain medication here. Patient will be discharged with recommended follow up with her PCP. Patient's pain likely due to diabetic neuropathy and she should follow up with her PCP for further evaluation and management. Patient will be discharged with Vicodin for pain.   Alvina Chou, [place] 02/21/14 2338

## 2014-02-21 NOTE — Discharge Instructions (Signed)
Take Vicodin as needed for pain. Follow up with your doctor for further evaluation and management of your diabetic neuropathy. Refer to attached documents for more information.

## 2014-02-21 NOTE — ED Notes (Signed)
Pt states she started having bilateral leg pain Saturday and is diabetic. Feels like knives are sticking in them. No known injury.

## 2014-02-21 NOTE — ED Notes (Signed)
Patient sitting up in bed.  States her legs are aching bilaterally

## 2014-02-21 NOTE — ED Provider Notes (Signed)
Medical screening examination/treatment/procedure(s) were performed by non-physician practitioner and as supervising physician I was immediately available for consultation/collaboration.   EKG Interpretation None        Hoy Morn, MD 02/21/14 [phone]

## 2014-02-26 ENCOUNTER — Other Ambulatory Visit: Payer: Self-pay | Admitting: *Deleted

## 2014-02-26 DIAGNOSIS — Z86711 Personal history of pulmonary embolism: Secondary | ICD-10-CM | POA: Diagnosis not present

## 2014-02-26 DIAGNOSIS — G8929 Other chronic pain: Secondary | ICD-10-CM | POA: Diagnosis not present

## 2014-02-26 DIAGNOSIS — E1149 Type 2 diabetes mellitus with other diabetic neurological complication: Secondary | ICD-10-CM | POA: Diagnosis not present

## 2014-02-26 DIAGNOSIS — Z794 Long term (current) use of insulin: Secondary | ICD-10-CM | POA: Diagnosis not present

## 2014-02-26 DIAGNOSIS — I10 Essential (primary) hypertension: Secondary | ICD-10-CM | POA: Diagnosis not present

## 2014-02-26 DIAGNOSIS — W19XXXA Unspecified fall, initial encounter: Secondary | ICD-10-CM | POA: Diagnosis not present

## 2014-02-26 DIAGNOSIS — Z7901 Long term (current) use of anticoagulants: Secondary | ICD-10-CM | POA: Diagnosis not present

## 2014-02-26 NOTE — Telephone Encounter (Signed)
Pt stepped on glucometer and needs a new one. Per [facility] with [facility]

## 2014-02-27 ENCOUNTER — Other Ambulatory Visit: Payer: Self-pay | Admitting: Internal Medicine

## 2014-02-27 MED ORDER — ACCU-CHEK AVIVA PLUS W/DEVICE KIT: 1.0000 | PACK | Freq: Three times a day (TID) | Status: DC

## 2014-03-01 ENCOUNTER — Other Ambulatory Visit: Payer: Self-pay | Admitting: *Deleted

## 2014-03-01 DIAGNOSIS — E1142 Type 2 diabetes mellitus with diabetic polyneuropathy: Secondary | ICD-10-CM

## 2014-03-01 DIAGNOSIS — E119 Type 2 diabetes mellitus without complications: Secondary | ICD-10-CM

## 2014-03-01 MED ORDER — ACCU-CHEK SOFTCLIX LANCET DEV MISC: Status: DC

## 2014-03-01 MED ORDER — GLUCOSE BLOOD VI STRP: ORAL_STRIP | Status: DC

## 2014-03-01 MED ORDER — ACCU-CHEK AVIVA PLUS W/DEVICE KIT: PACK | Status: DC

## 2014-03-01 NOTE — Telephone Encounter (Signed)
Call from Lamoni from [facility] - stated pt's machine broke and she had thrown it in the trash can and needs another one w/supplies.  Thanks

## 2014-03-06 ENCOUNTER — Other Ambulatory Visit: Payer: Self-pay | Admitting: *Deleted

## 2014-03-06 DIAGNOSIS — E1142 Type 2 diabetes mellitus with diabetic polyneuropathy: Secondary | ICD-10-CM

## 2014-03-06 DIAGNOSIS — E119 Type 2 diabetes mellitus without complications: Secondary | ICD-10-CM

## 2014-03-06 NOTE — Telephone Encounter (Signed)
Insurance, Medicare part B will not pay for testing supplies through mail order.  You just ordered the above supplies with [facility]. Pt should be able to get locally @ [facility] with her part B plan. Please resend to [employer].

## 2014-03-07 MED ORDER — ACCU-CHEK AVIVA PLUS W/DEVICE KIT
PACK | Status: DC
Start: ? — End: 2014-07-02

## 2014-03-07 MED ORDER — ACCU-CHEK SOFTCLIX LANCET DEV MISC: Status: DC

## 2014-03-07 MED ORDER — GLUCOSE BLOOD VI STRP: ORAL_STRIP | Status: DC

## 2014-03-21 ENCOUNTER — Other Ambulatory Visit: Payer: Self-pay | Admitting: Internal Medicine

## 2014-03-27 ENCOUNTER — Other Ambulatory Visit: Payer: Self-pay | Admitting: Internal Medicine

## 2014-03-27 ENCOUNTER — Other Ambulatory Visit: Payer: Self-pay | Admitting: *Deleted

## 2014-03-27 MED ORDER — WARFARIN SODIUM 5 MG PO TABS: 12.5000 mg | ORAL_TABLET | ORAL | Status: DC

## 2014-03-27 NOTE — Telephone Encounter (Signed)
Pharmacy is asking for # 90 a month on coumadin.  Pt is taking 2 - 3 pills a day so # 30 is not enough.

## 2014-03-28 ENCOUNTER — Other Ambulatory Visit: Payer: Self-pay | Admitting: *Deleted

## 2014-03-28 DIAGNOSIS — G8929 Other chronic pain: Secondary | ICD-10-CM

## 2014-03-28 DIAGNOSIS — E1142 Type 2 diabetes mellitus with diabetic polyneuropathy: Secondary | ICD-10-CM

## 2014-03-28 DIAGNOSIS — M549 Dorsalgia, unspecified: Principal | ICD-10-CM

## 2014-03-28 MED ORDER — GABAPENTIN 300 MG PO CAPS: 300.0000 mg | ORAL_CAPSULE | Freq: Three times a day (TID) | ORAL | Status: DC

## 2014-03-28 NOTE — Telephone Encounter (Signed)
Called to pharm 

## 2014-04-02 ENCOUNTER — Ambulatory Visit: Payer: Medicare Other

## 2014-04-05 ENCOUNTER — Ambulatory Visit (INDEPENDENT_AMBULATORY_CARE_PROVIDER_SITE_OTHER): Payer: BC Managed Care – HMO | Admitting: Internal Medicine

## 2014-04-05 ENCOUNTER — Ambulatory Visit (HOSPITAL_COMMUNITY)
Admission: RE | Admit: 2014-04-05 | Discharge: 2014-04-05 | Disposition: A | Discharge status: Home / Self Care | Payer: Medicare Other | Source: Ambulatory Visit | Attending: Internal Medicine | Admitting: Internal Medicine

## 2014-04-05 ENCOUNTER — Encounter: Payer: Self-pay | Admitting: Internal Medicine

## 2014-04-05 ENCOUNTER — Encounter (HOSPITAL_COMMUNITY): Payer: Self-pay

## 2014-04-05 VITALS — BP 139/84 | HR 90 | Temp 97.2°F | Ht 69.0 in | Wt 250.8 lb

## 2014-04-05 DIAGNOSIS — R51 Headache: Secondary | ICD-10-CM | POA: Insufficient documentation

## 2014-04-05 DIAGNOSIS — E119 Type 2 diabetes mellitus without complications: Secondary | ICD-10-CM | POA: Insufficient documentation

## 2014-04-05 DIAGNOSIS — I2699 Other pulmonary embolism without acute cor pulmonale: Secondary | ICD-10-CM | POA: Diagnosis not present

## 2014-04-05 DIAGNOSIS — F3289 Other specified depressive episodes: Secondary | ICD-10-CM | POA: Diagnosis not present

## 2014-04-05 DIAGNOSIS — Z23 Encounter for immunization: Secondary | ICD-10-CM | POA: Diagnosis not present

## 2014-04-05 DIAGNOSIS — M316 Other giant cell arteritis: Secondary | ICD-10-CM | POA: Diagnosis not present

## 2014-04-05 DIAGNOSIS — F329 Major depressive disorder, single episode, unspecified: Secondary | ICD-10-CM | POA: Diagnosis not present

## 2014-04-05 DIAGNOSIS — H0520 Unspecified exophthalmos: Secondary | ICD-10-CM | POA: Diagnosis not present

## 2014-04-05 DIAGNOSIS — R519 Headache, unspecified: Secondary | ICD-10-CM

## 2014-04-05 DIAGNOSIS — R269 Unspecified abnormalities of gait and mobility: Secondary | ICD-10-CM | POA: Diagnosis not present

## 2014-04-05 DIAGNOSIS — E1369 Other specified diabetes mellitus with other specified complication: Secondary | ICD-10-CM | POA: Diagnosis not present

## 2014-04-05 DIAGNOSIS — I10 Essential (primary) hypertension: Secondary | ICD-10-CM | POA: Diagnosis not present

## 2014-04-05 DIAGNOSIS — M549 Dorsalgia, unspecified: Secondary | ICD-10-CM | POA: Diagnosis present

## 2014-04-05 DIAGNOSIS — E118 Type 2 diabetes mellitus with unspecified complications: Secondary | ICD-10-CM | POA: Diagnosis not present

## 2014-04-05 LAB — GLUCOSE, CAPILLARY: GLUCOSE-CAPILLARY: 315 mg/dL — AB (ref 70–99)

## 2014-04-05 LAB — C-REACTIVE PROTEIN: CRP: 1.2 mg/dL — AB (ref <0.60)

## 2014-04-05 LAB — SEDIMENTATION RATE: Sed Rate: 60 mm/hr — ABNORMAL HIGH (ref 0–22)

## 2014-04-05 MED ORDER — IOHEXOL 300 MG/ML  SOLN
100.0000 mL | Freq: Once | INTRAMUSCULAR | Status: AC | PRN
  Administered 2014-04-05: 100 mL via INTRAVENOUS

## 2014-04-05 MED ORDER — PREDNISONE 50 MG PO TABS: ORAL_TABLET | ORAL | Status: DC

## 2014-04-05 MED ORDER — PREDNISONE 50 MG PO TABS: 50.0000 mg | ORAL_TABLET | Freq: Every day | ORAL | Status: DC

## 2014-04-05 NOTE — Assessment & Plan Note (Signed)
Plans:  Close monitoring of DM is needed as she is started on Prednisone 50 mg Advised patient to check her blood sugars Will follow up next week.

## 2014-04-05 NOTE — Patient Instructions (Signed)
Take Prednisone 50 mg 1 tablet once daily. Take all your medications as instructed before. If your head ache worsens or if you have vision is getting worse, call 911 or seek immediate medical help. Follow up with your eye doctor tomorrow at 10:30 as scheduled. We will call you with an appointment for biopsy.

## 2014-04-05 NOTE — Assessment & Plan Note (Signed)
As per the American College of Rheumatology, the following criteria were designed to distinguish temporal arteritis from other vasculitis: 1. Age greater than or equal to 50 years at time of disease onset 2.  Localized headache of new onset 3. Tenderness or decreased pulse of the temporal artery 4. Erythrocyte sedimentation rate (ESR) greater than 50 mm/hour  5. Biopsy revealing a necrotizing arteritis with a predominance of mononuclear cells or a granulomatous process with multinucleated giant cells. And  the presence of three of these five criteria is associated with a 94 percent sensitivity and a 91 percent specificity for the diagnosis of GCA. CT of the sinuses ruled out the possibility of sinusitis or its complications. No changes in Visual acuity from last year (looking at her opthal notes from 2014) and no ocular symptoms makes intra-ocular pathology such as retinal detachment, retinal tears, vitreous hemorrhage, optic neuritis etc. less likely as well. Patient symptoms (severe right sided head ache around the eye), physical signs (severe tenderness to palpation over the temporal area) and elevated ESR of 60,  in a 62 year old patient are concerning enough to consider the possibility of Temporal arteritis and it is until proven otherwise.  Plans: Empiric treatment with prednisone 50 mg once daily until symptomatic response.  Continue the low dose ASA. Plan for temporal artery biopsy with a vascular surgeon or general surgeon. As the patients doesn't have vision loss, there is no need for IV Methyl prednisone or hospitalization. Opthalmology appointment tomorrow at 10:30 to rule out intraocular pathology. Follow up in the office next office. Close monitoring of blood sugars in the setting of high dose administration of prednisone in an uncontrolled DM. Educated patient to call 911 or seek immediate help if her vision is to get worse.  

## 2014-04-05 NOTE — Progress Notes (Signed)
Subjective:   Patient ID: Joy Butler female   DOB: [birth year] 63 y.o.   MRN: [id number]  HPI: [name] is a 63 y.o. 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  Patient reports that her symptoms started about a week ago. When asked about the exact location of the pain, patient reports "around" the right eye and right temporal area. Symptoms started abruptly, constant, 10/10 in severity, gradually getting worse, no aggravating factors but relieved by sleeping on the right side and tylenol helps her slightly as well. Patient denies any worsening of her vision in her right eye although she described blurred vision in both the eyes. She denies any fever, chills, body pains. She denies any redness of her right eye, trauma to the eye, black spots. She denies any head ache on the left side or nausea or vomiting. She denies any such symptoms in the past. She denies history of migraine headaches. Patient denies any family history of similar headaches. Patient denies runny nose, sore throat, flu like like symptoms, cough, SOB, Sinus head aches.  Patient reports that she had been to her opthalmologist last year and was evaluated by him. Apparently, as per patient, she underwent laser treatment to her right eye and was told to follow up with him.  Past Medical History  Diagnosis Date  . Diabetes mellitus     diagnosed in 2000.    Marland Kitchen Hypertension   . Pulmonary embolism     2011 treated at [facility] in [place].  Was on Coumadin for over a year.  no known family history  . UTI (urinary tract infection)   . High cholesterol   . 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 Depression   . Neuropathy     feet   Current Outpatient Prescriptions  Medication Sig Dispense Refill  . amLODipine (NORVASC) 10 MG tablet TAKE 1 TABLET BY  MOUTH EVERY DAY  60 tablet  3  . Blood Glucose Monitoring Suppl (ACCU-CHEK AVIVA PLUS) W/DEVICE KIT Please check blood sugar 3 to 4 times daily. diag code 250.0. Insulin dependent  1 kit  0  . Blood Pressure KIT 1 Units by Does not apply route daily.  1 each  0  . citalopram (CELEXA) 20 MG tablet TAKE 1 TABLET BY MOUTH DAILY  90 tablet  3  . diphenhydrAMINE-zinc acetate (BENADRYL) cream Apply topically 3 (three) times daily as needed for itching.  28.3 g  0  . EASY TOUCH INSULIN SYRINGE 31G X 5/16" 1 ML MISC USE AS DIRECTED TWICE DAILY  100 each  6  . gabapentin (NEURONTIN) 300 MG capsule Take 1 capsule (300 mg total) by mouth 3 (three) times daily.  270 capsule  1  . glucose blood (ACCU-CHEK AVIVA) test strip Please check blood sugar 3 to 4 times daily. diag code 250.0. Insulin dependent  100 each  12  . HYDROcodone-acetaminophen (NORCO/VICODIN) 5-325 MG per tablet Take 2 tablets by mouth every 4 (four) hours as needed.  12 tablet  0  . insulin NPH-regular Human (NOVOLIN 70/30) (70-30) 100 UNIT/ML injection Inject 36 Units into the skin 2 (two) times daily with a meal.  20 mL  11  . Lancet Devices (ACCU-CHEK SOFTCLIX) lancets Use to check blood sugars 3 to 4 times daily. Dx code: 250.00. Insulin  dependent.  100 each  0  . losartan-hydrochlorothiazide (HYZAAR) 100-25 MG per tablet TAKE 1 TABLET BY MOUTH EVERY DAY  90 tablet  3  . pravastatin (PRAVACHOL) 40 MG tablet TAKE 1 TABLET BY MOUTH EVERY EVENING  60 tablet  6  . predniSONE (DELTASONE) 50 MG tablet Take 1 tablet (50 mg total) by mouth daily.  30 tablet  0  . warfarin (COUMADIN) 5 MG tablet Take 2.5-3 tablets (12.5-15 mg total) by mouth See admin instructions. Patient takes 12.5 mg on all days except on Monday and Thursday patient takes 59m  (patient uses 511mstrength tablets)  90 tablet  3   No current facility-administered medications for this visit.   Family History  Problem Relation Age of Onset  . Adopted: Yes   History   Social  History  . Marital Status: Single    Spouse Name: N/A    Number of Children: N/A  . Years of Education: N/A   Social History Main Topics  . Smoking status: Current Every Day Smoker -- 0.05 packs/day for 10 years    Types: Cigarettes  . Smokeless tobacco: Never Used     Comment: Wants patches.  . Alcohol Use: No  . Drug Use: No  . Sexual Activity: Not Currently   Other Topics Concern  . None   Social History Narrative   Previously divorced now engaged with her partner of 4 years.  Has 6 grown children with 21 grandchildren.  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  [place] and was transiently homeless until first part of October 2013.     Review of Systems: Pertinent items are noted in HPI. Objective:  Physical Exam: Filed Vitals:   04/05/14 1331  BP: 139/84  Pulse: 90  Temp: 97.2 F (36.2 C)  TempSrc: Oral  Height: 5' 9"  (1.753 m)  Weight: 250 lb 12.8 oz (113.762 kg)  SpO2: 93%   Constitutional: Vital signs reviewed.  Patient is a morbidly obese african american women in her 63sitting in a wheel chair, appears to be in moderate distress secondary to pain with hands placed on the right temporal area. Patient is co-operative with exam.  Alert and oriented x3.  Head: Normocephalic and atraumatic. Moderate to severe tenderness to palpation over the right temporal area slightly extending anteriorly. Unable to palpate the temporal artery. Ear: TM normal bilaterally Nose: No erythema or drainage noted.  Turbinates normal Mouth: no erythema or exudates, MMM Eyes: No scleral icterus notes. Conjunctiva appear to be normal. No visible injuries to the cornea or iris noted. Pupils equal, slightly constricted and reacting sluggishly to light. Mild photophobia noted on right eye. EOM intact but elicited pain in the right upon inferolateral movement.  V/A on Snellen chart  Is Right eye 20/50 and left eye is 20/50.  Retinoscopy was unsuccessful as the pupils were slightly constricted. Neck: Supple, Trachea midline normal ROM, No JVD, mass, thyromegaly, or carotid bruit present.  Cardiovascular: RRR, S1 normal, S2 normal, no MRG, pulses symmetric and intact bilaterally Pulmonary/Chest: normal respiratory effort, CTAB, no wheezes, rales, or rhonchi Musculoskeletal: No joint deformities, erythema, or stiffness, ROM full and no nontender Hematology: no cervical, inginal, or axillary adenopathy.  Neurological: A&O x3, Strength is normal and symmetric bilaterally, cranial nerve II-XII are grossly intact, no focal motor deficit, sensory intact to light touch bilaterally.  Skin: Warm, dry and intact. No rash, cyanosis, or clubbing.  Psychiatric: Normal  mood and affect. speech and behavior is normal. Judgment and thought content normal. Cognition and memory are normal.    Assessment & Plan:

## 2014-04-06 NOTE — Addendum Note (Signed)
Addended by: Carter Kitten on: 04/06/2014 12:53 PM   Modules accepted: Orders

## 2014-04-07 NOTE — Progress Notes (Signed)
I saw and evaluated the patient.  I personally confirmed the key portions of Dr. Daneil Dolin history and exam and reviewed pertinent patient test results.  The assessment, diagnosis, and plan were formulated together and I agree with the documentation in the resident's note.

## 2014-04-13 DIAGNOSIS — H4900 Third [oculomotor] nerve palsy, unspecified eye: Secondary | ICD-10-CM | POA: Diagnosis not present

## 2014-04-16 ENCOUNTER — Encounter: Payer: Self-pay | Admitting: Internal Medicine

## 2014-04-18 ENCOUNTER — Telehealth: Payer: Self-pay | Admitting: *Deleted

## 2014-04-18 ENCOUNTER — Other Ambulatory Visit: Payer: Self-pay | Admitting: *Deleted

## 2014-04-23 ENCOUNTER — Encounter: Payer: Self-pay | Admitting: Vascular Surgery

## 2014-04-24 ENCOUNTER — Encounter: Payer: Medicare Other | Admitting: Vascular Surgery

## 2014-04-24 ENCOUNTER — Other Ambulatory Visit: Payer: Self-pay | Admitting: Internal Medicine

## 2014-04-24 ENCOUNTER — Telehealth: Payer: Self-pay | Admitting: Vascular Surgery

## 2014-04-24 NOTE — Telephone Encounter (Signed)
Patient did not show up for her appointment with dr. early 04-24-14, called patient who claims she was not aware, r/s appointment to 05-08-14

## 2014-04-26 NOTE — Telephone Encounter (Signed)
closed

## 2014-04-30 ENCOUNTER — Telehealth: Payer: Self-pay | Admitting: Internal Medicine

## 2014-04-30 DIAGNOSIS — M316 Other giant cell arteritis: Secondary | ICD-10-CM

## 2014-04-30 NOTE — Telephone Encounter (Signed)
I called patient last week to ask regarding why she missed her vascular surgery appointment and opthalmology appointment. There was no answer on the phone. Called today again and left a voice message after there was no answer. Patient needs to be seen in the clinic to check on her symptoms of right sided temporal headaches, to monitor blood sugar values. Patient apparently was a no show to the opthalmology appointment as well as the vascular surgery appointment. Left a voice message asking the patient to call the clinic. Spoke to [facility] to make an appointment to be seen in this week and inform the patient via phone or mail the appointment date and time.

## 2014-04-30 NOTE — Assessment & Plan Note (Addendum)
Clinically high suspicion for Temporal Arteritis. Please see my earlier A/P for temporal arteritis. Glucucorticoid therapy was initiated upon high suspicion. Patient was a no show to the opthalmology appointment and the vascular biopsy procedure. Patient has another appointment for vascular biopsy on 05/08/14 but doubt the biopsy results will still be positive at this point, especially after 4 weeks of glucocorticoid therapy. Patient needs to be seen in the clinic before considering the tapering of glucocorticoid therapy. If symptoms are improved, consider slow tapering of prednisone to 20 mg over a month period. Will check ESR at the next office visit.

## 2014-05-07 ENCOUNTER — Encounter: Payer: Self-pay | Admitting: Vascular Surgery

## 2014-05-08 ENCOUNTER — Encounter (HOSPITAL_COMMUNITY): Payer: Self-pay | Admitting: *Deleted

## 2014-05-08 ENCOUNTER — Ambulatory Visit (INDEPENDENT_AMBULATORY_CARE_PROVIDER_SITE_OTHER): Payer: Medicare Other | Admitting: Vascular Surgery

## 2014-05-08 ENCOUNTER — Encounter: Payer: Self-pay | Admitting: Vascular Surgery

## 2014-05-08 ENCOUNTER — Encounter (HOSPITAL_COMMUNITY): Payer: Self-pay | Admitting: Pharmacy Technician

## 2014-05-08 ENCOUNTER — Other Ambulatory Visit: Payer: Self-pay

## 2014-05-08 VITALS — BP 137/87 | HR 104 | Ht 69.0 in | Wt 255.0 lb

## 2014-05-08 DIAGNOSIS — M316 Other giant cell arteritis: Secondary | ICD-10-CM | POA: Diagnosis not present

## 2014-05-08 MED ORDER — CHLORHEXIDINE GLUCONATE CLOTH 2 % EX PADS: 6.0000 | MEDICATED_PAD | Freq: Once | CUTANEOUS | Status: DC

## 2014-05-08 MED ORDER — SODIUM CHLORIDE 0.9 % IV SOLN: INTRAVENOUS | Status: DC

## 2014-05-08 MED ORDER — VANCOMYCIN HCL 10 G IV SOLR
1500.0000 mg | INTRAVENOUS | Status: AC
  Administered 2014-05-09: 1500 mg via INTRAVENOUS
  Filled 2014-05-08: qty 1500

## 2014-05-08 NOTE — Progress Notes (Signed)
   Patient name: Joy Butler MRN: [id number] DOB: 08/16/1951 Sex: female   Referred by: [place]  Reason for referral:  Chief Complaint  Patient presents with  . New Evaluation    eval for temporal artery biopsy    HISTORY OF PRESENT ILLNESS: The patient presents today for consideration of temporal artery biopsy. She has had a several month history of right sided headache. She was found by her primary care physician to have tenderness over temporal artery in the issue of potential temporal arteritis was raised. CT scan of the head showed no sinusitis or other potential cause for headache. Her past history is significant for prior pulmonary embolus. Also has a history of splenic infarction a CT scan   Past Medical History  Diagnosis Date  . Diabetes mellitus     diagnosed in 2000.    . Hypertension   . Pulmonary embolism     2011 treated at [facility] in [place].  Was on Coumadin for over a year.  no known family history  . UTI (urinary tract infection)   . High cholesterol   . 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 Depression   . Neuropathy     feet    Past Surgical History  Procedure Laterality Date  . Abdominal hysterectomy    . Cholecystectomy      1980  . Esophagogastroduodenoscopy  08/19/2012    Procedure: ESOPHAGOGASTRODUODENOSCOPY (EGD);  Surgeon: James L Edwards Jr., MD;  Location: [facility];  Service: Endoscopy;  Laterality: N/A;  . Cesarean section    . Hand surgery    . Carpal tunnel release Bilateral     History   Social History  . Marital Status: Single    Spouse Name: N/A    Number of Children: N/A  . Years of Education: N/A   Occupational History  . Not on file.   Social History Main Topics  . Smoking status: Current Every Day Smoker -- 0.05 packs/day for 10 years    Types: Cigarettes  . Smokeless tobacco: Never Used     Comment: Wants patches.  . Alcohol Use: No  . Drug Use: No  . Sexual Activity: Not Currently   Other  Topics Concern  . Not on file   Social History Narrative   Previously divorced now engaged with her partner of 4 years.  Has 6 grown children with 21 grandchildren.  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  Moved to [place] in August 2013 and was transiently homeless until first part of October 2013.      Family History  Problem Relation Age of Onset  . Adopted: Yes    Allergies as of 05/08/2014 - Review Complete 05/08/2014  Allergen Reaction Noted  . Keflex [cephalexin] Itching and Swelling 08/19/2012  . Latex Itching 09/01/2012  . Sulfa antibiotics Rash 08/16/2012  . Sulfur Rash 08/16/2012    Current Outpatient Prescriptions on File Prior to Visit  Medication Sig Dispense Refill  . amLODipine (NORVASC) 10 MG tablet Take 1 tablet (10 mg total) by mouth daily.  90 tablet  3  . citalopram (CELEXA) 20 MG tablet TAKE 1 TABLET BY MOUTH DAILY  90 tablet  3  . diphenhydrAMINE-zinc acetate (BENADRYL) cream Apply topically 3 (three) times daily as needed for itching.  28.3 g  0  . EASY TOUCH INSULIN SYRINGE 31G X 5/16" 1 ML MISC USE AS DIRECTED TWICE DAILY    100 each  6  . gabapentin (NEURONTIN) 300 MG capsule Take 1 capsule (300 mg total) by mouth 3 (three) times daily.  270 capsule  1  . HYDROcodone-acetaminophen (NORCO/VICODIN) 5-325 MG per tablet Take 2 tablets by mouth every 4 (four) hours as needed.  12 tablet  0  . insulin NPH-regular Human (NOVOLIN 70/30) (70-30) 100 UNIT/ML injection Inject 36 Units into the skin 2 (two) times daily with a meal.  20 mL  11  . losartan-hydrochlorothiazide (HYZAAR) 100-25 MG per tablet TAKE 1 TABLET BY MOUTH EVERY DAY  90 tablet  3  . pravastatin (PRAVACHOL) 40 MG tablet TAKE 1 TABLET BY MOUTH EVERY EVENING  60 tablet  6  . predniSONE (DELTASONE) 50 MG tablet Take 1 tablet (50 mg total) by mouth daily.  30 tablet  0  . warfarin (COUMADIN) 5 MG tablet Take 2.5-3 tablets (12.5-15 mg total) by mouth See admin  instructions. Patient takes 12.5 mg on all days except on Monday and Thursday patient takes 18m  (patient uses 574mstrength tablets)  90 tablet  3  . Blood Glucose Monitoring Suppl (ACCU-CHEK AVIVA PLUS) W/DEVICE KIT Please check blood sugar 3 to 4 times daily. diag code 250.0. Insulin dependent  1 kit  0  . Blood Pressure KIT 1 Units by Does not apply route daily.  1 each  0  . glucose blood (ACCU-CHEK AVIVA) test strip Please check blood sugar 3 to 4 times daily. diag code 250.0. Insulin dependent  100 each  12  . Lancet Devices (ACCU-CHEK SOFTCLIX) lancets Use to check blood sugars 3 to 4 times daily. Dx code: 250.00. Insulin dependent.  100 each  0   No current facility-administered medications on file prior to visit.     REVIEW OF SYSTEMS:  Positives indicated with an "X"  CARDIOVASCULAR:  [ ] chest pain   [ ] chest pressure   [ ] palpitations   [ ] orthopnea   [ ] dyspnea on exertion   [ ] claudication   [ ] rest pain   [ ] DVT   [ ] phlebitis PULMONARY:   [ ] productive cough   [ ] asthma   [ ] wheezing NEUROLOGIC:   [ ] weakness  [ ] paresthesias  [ ] aphasia  [ ] amaurosis  [ ] dizziness HEMATOLOGIC:   [ ] bleeding problems   [ ] clotting disorders MUSCULOSKELETAL:  [ ] joint pain   [ ] joint swelling GASTROINTESTINAL: [ ]  blood in stool  [ ]  hematemesis GENITOURINARY:  [ ]  dysuria  [ ]  hematuria PSYCHIATRIC:  [ ] history of major depression INTEGUMENTARY:  [ ] rashes  [ ] ulcers CONSTITUTIONAL:  [ ] fever   [ ] chills  PHYSICAL EXAMINATION:  General: The patient is a well-nourished female, in no acute distress. Vital signs are BP 137/87  Pulse 104  Ht 5' 9" (1.753 m)  Wt 255 lb (115.667 kg)  BMI 37.64 kg/m2  SpO2 95% Pulmonary: There is a good air exchange bilaterally without wheezing or rales. Abdomen: Soft and non-tender with normal pitch bowel sounds. Musculoskeletal: There are no major deformities.  There is no significant extremity pain. Neurologic: No focal  weakness or paresthesias are detected, Skin: There are no ulcer or rashes noted. Psychiatric: The patient has normal affect. Cardiovascular: There is a regular rate and rhythm without significant murmur appreciated.  carotid arteries without bruits bilaterally  She does have mild tenderness  over her right temporal artery. She has an easily palpable temporal artery pulse and also easy palpable left temporal artery pulse with no tenderness      Impression and Plan:   Discussed with the patient. I explained our role as far as diagnosis for temporal artery biopsy for ruling out temporal arteritis as an etiology for headache. I explained this is an outpatient she under local anesthesia with sedation. And she wishes received proceed as soon as possible. We have scheduled this for tomorrow as an outpatient at [place]     Shaana Acocella F Meganne Rita [facility] [place] Office: [phone]         

## 2014-05-09 ENCOUNTER — Telehealth: Payer: Self-pay | Admitting: Vascular Surgery

## 2014-05-09 ENCOUNTER — Encounter (HOSPITAL_COMMUNITY): Payer: Self-pay | Admitting: *Deleted

## 2014-05-09 ENCOUNTER — Ambulatory Visit (HOSPITAL_COMMUNITY)
Admission: RE | Admit: 2014-05-09 | Discharge: 2014-05-09 | Disposition: A | Discharge status: Home / Self Care | Payer: Medicare Other | Source: Ambulatory Visit | Attending: Vascular Surgery | Admitting: Vascular Surgery

## 2014-05-09 ENCOUNTER — Encounter (HOSPITAL_COMMUNITY)
Admission: RE | Disposition: A | Discharge status: Home / Self Care | Payer: Self-pay | Source: Ambulatory Visit | Attending: Vascular Surgery

## 2014-05-09 ENCOUNTER — Encounter (HOSPITAL_COMMUNITY): Payer: Medicare Other | Admitting: Certified Registered Nurse Anesthetist

## 2014-05-09 ENCOUNTER — Ambulatory Visit (HOSPITAL_COMMUNITY): Payer: Medicare Other | Admitting: Certified Registered Nurse Anesthetist

## 2014-05-09 ENCOUNTER — Ambulatory Visit (HOSPITAL_COMMUNITY): Payer: Medicare Other

## 2014-05-09 DIAGNOSIS — I739 Peripheral vascular disease, unspecified: Secondary | ICD-10-CM | POA: Insufficient documentation

## 2014-05-09 DIAGNOSIS — F3289 Other specified depressive episodes: Secondary | ICD-10-CM | POA: Diagnosis not present

## 2014-05-09 DIAGNOSIS — Z86711 Personal history of pulmonary embolism: Secondary | ICD-10-CM | POA: Diagnosis not present

## 2014-05-09 DIAGNOSIS — Z01818 Encounter for other preprocedural examination: Secondary | ICD-10-CM | POA: Diagnosis not present

## 2014-05-09 DIAGNOSIS — M316 Other giant cell arteritis: Secondary | ICD-10-CM | POA: Diagnosis not present

## 2014-05-09 DIAGNOSIS — Z79899 Other long term (current) drug therapy: Secondary | ICD-10-CM | POA: Insufficient documentation

## 2014-05-09 DIAGNOSIS — F17200 Nicotine dependence, unspecified, uncomplicated: Secondary | ICD-10-CM | POA: Insufficient documentation

## 2014-05-09 DIAGNOSIS — E119 Type 2 diabetes mellitus without complications: Secondary | ICD-10-CM | POA: Diagnosis not present

## 2014-05-09 DIAGNOSIS — Z882 Allergy status to sulfonamides status: Secondary | ICD-10-CM | POA: Diagnosis not present

## 2014-05-09 DIAGNOSIS — Z91040 Latex allergy status: Secondary | ICD-10-CM | POA: Insufficient documentation

## 2014-05-09 DIAGNOSIS — I10 Essential (primary) hypertension: Secondary | ICD-10-CM | POA: Diagnosis not present

## 2014-05-09 DIAGNOSIS — F329 Major depressive disorder, single episode, unspecified: Secondary | ICD-10-CM | POA: Insufficient documentation

## 2014-05-09 DIAGNOSIS — Z7901 Long term (current) use of anticoagulants: Secondary | ICD-10-CM | POA: Insufficient documentation

## 2014-05-09 DIAGNOSIS — Z794 Long term (current) use of insulin: Secondary | ICD-10-CM | POA: Insufficient documentation

## 2014-05-09 DIAGNOSIS — G5790 Unspecified mononeuropathy of unspecified lower limb: Secondary | ICD-10-CM | POA: Insufficient documentation

## 2014-05-09 DIAGNOSIS — D210 Benign neoplasm of connective and other soft tissue of head, face and neck: Secondary | ICD-10-CM | POA: Diagnosis not present

## 2014-05-09 DIAGNOSIS — R51 Headache: Secondary | ICD-10-CM | POA: Diagnosis not present

## 2014-05-09 DIAGNOSIS — Z881 Allergy status to other antibiotic agents status: Secondary | ICD-10-CM | POA: Insufficient documentation

## 2014-05-09 HISTORY — PX: ARTERY BIOPSY: SHX891

## 2014-05-09 HISTORY — DX: Pneumonia, unspecified organism: J18.9

## 2014-05-09 HISTORY — DX: Calculus of kidney: N20.0

## 2014-05-09 LAB — APTT: aPTT: 26 seconds (ref 24–37)

## 2014-05-09 LAB — GLUCOSE, CAPILLARY
Glucose-Capillary: 278 mg/dL — ABNORMAL HIGH (ref 70–99)
Glucose-Capillary: 250 mg/dL — ABNORMAL HIGH (ref 70–99)

## 2014-05-09 LAB — PROTIME-INR
INR: 0.95 (ref 0.00–1.49)
Prothrombin Time: 12.5 seconds (ref 11.6–15.2)

## 2014-05-09 SURGERY — BIOPSY TEMPORAL ARTERY
Anesthesia: Monitor Anesthesia Care | Laterality: Right

## 2014-05-09 MED ORDER — ONDANSETRON HCL 4 MG/2ML IJ SOLN
INTRAMUSCULAR | Status: AC
  Filled 2014-05-09: qty 2

## 2014-05-09 MED ORDER — METOCLOPRAMIDE HCL 5 MG/ML IJ SOLN
INTRAMUSCULAR | Status: AC
  Filled 2014-05-09: qty 2

## 2014-05-09 MED ORDER — OXYCODONE-ACETAMINOPHEN 5-325 MG PO TABS: 1.0000 | ORAL_TABLET | ORAL | Status: DC | PRN

## 2014-05-09 MED ORDER — PROPOFOL 10 MG/ML IV BOLUS
INTRAVENOUS | Status: AC
  Filled 2014-05-09: qty 20

## 2014-05-09 MED ORDER — HYDROMORPHONE HCL PF 1 MG/ML IJ SOLN
0.2500 mg | INTRAMUSCULAR | Status: DC | PRN
  Administered 2014-05-09 (×4): 0.5 mg via INTRAVENOUS

## 2014-05-09 MED ORDER — LIDOCAINE-EPINEPHRINE 0.5 %-1:200000 IJ SOLN
INTRAMUSCULAR | Status: AC
  Filled 2014-05-09: qty 1

## 2014-05-09 MED ORDER — LACTATED RINGERS IV SOLN
INTRAVENOUS | Status: DC | PRN
  Administered 2014-05-09: 11:00:00 via INTRAVENOUS

## 2014-05-09 MED ORDER — PROPOFOL 10 MG/ML IV BOLUS
INTRAVENOUS | Status: DC | PRN
  Administered 2014-05-09: 10 mg via INTRAVENOUS
  Administered 2014-05-09 (×3): 20 mg via INTRAVENOUS

## 2014-05-09 MED ORDER — LIDOCAINE HCL (CARDIAC) 20 MG/ML IV SOLN
INTRAVENOUS | Status: AC
  Filled 2014-05-09: qty 5

## 2014-05-09 MED ORDER — ROCURONIUM BROMIDE 50 MG/5ML IV SOLN
INTRAVENOUS | Status: AC
  Filled 2014-05-09: qty 1

## 2014-05-09 MED ORDER — HYDROMORPHONE HCL PF 1 MG/ML IJ SOLN
INTRAMUSCULAR | Status: AC
  Filled 2014-05-09: qty 1

## 2014-05-09 MED ORDER — FENTANYL CITRATE 0.05 MG/ML IJ SOLN
INTRAMUSCULAR | Status: AC
  Filled 2014-05-09: qty 5

## 2014-05-09 MED ORDER — LIDOCAINE HCL (CARDIAC) 20 MG/ML IV SOLN
INTRAVENOUS | Status: DC | PRN
  Administered 2014-05-09: 100 mg via INTRAVENOUS

## 2014-05-09 MED ORDER — LACTATED RINGERS IV SOLN: INTRAVENOUS | Status: DC

## 2014-05-09 MED ORDER — 0.9 % SODIUM CHLORIDE (POUR BTL) OPTIME
TOPICAL | Status: DC | PRN
  Administered 2014-05-09: 1000 mL

## 2014-05-09 MED ORDER — METOCLOPRAMIDE HCL 5 MG/ML IJ SOLN
INTRAMUSCULAR | Status: DC | PRN
  Administered 2014-05-09: 10 mg via INTRAVENOUS

## 2014-05-09 MED ORDER — ONDANSETRON HCL 4 MG/2ML IJ SOLN: 4.0000 mg | Freq: Once | INTRAMUSCULAR | Status: DC | PRN

## 2014-05-09 MED ORDER — LIDOCAINE-EPINEPHRINE 0.5 %-1:200000 IJ SOLN
INTRAMUSCULAR | Status: DC | PRN
  Administered 2014-05-09: 50 mL

## 2014-05-09 MED ORDER — ONDANSETRON HCL 4 MG/2ML IJ SOLN
INTRAMUSCULAR | Status: DC | PRN
  Administered 2014-05-09: 4 mg via INTRAVENOUS

## 2014-05-09 MED ORDER — PROPOFOL INFUSION 10 MG/ML OPTIME
INTRAVENOUS | Status: DC | PRN
  Administered 2014-05-09: 25 ug/kg/min via INTRAVENOUS

## 2014-05-09 MED ORDER — FENTANYL CITRATE 0.05 MG/ML IJ SOLN
INTRAMUSCULAR | Status: DC | PRN
  Administered 2014-05-09 (×4): 25 ug via INTRAVENOUS
  Administered 2014-05-09: 50 ug via INTRAVENOUS

## 2014-05-09 SURGICAL SUPPLY — 42 items
BENZOIN TINCTURE PRP APPL 2/3 (GAUZE/BANDAGES/DRESSINGS) ×3 IMPLANT
BLADE 10 SAFETY STRL DISP (BLADE) ×3 IMPLANT
CANISTER SUCTION 2500CC (MISCELLANEOUS) ×3 IMPLANT
CLIP LIGATING EXTRA MED SLVR (CLIP) ×3 IMPLANT
CLIP LIGATING EXTRA SM BLUE (MISCELLANEOUS) ×3 IMPLANT
CLOSURE WOUND 1/2 X4 (GAUZE/BANDAGES/DRESSINGS) ×1
CONT SPEC 4OZ CLIKSEAL STRL BL (MISCELLANEOUS) ×3 IMPLANT
COTTONBALL LRG STERILE PKG (GAUZE/BANDAGES/DRESSINGS) ×3 IMPLANT
COVER SURGICAL LIGHT HANDLE (MISCELLANEOUS) ×3 IMPLANT
DECANTER SPIKE VIAL GLASS SM (MISCELLANEOUS) ×3 IMPLANT
DRAPE LAPAROTOMY T 102X78X121 (DRAPES) ×3 IMPLANT
ELECT REM PT RETURN 9FT ADLT (ELECTROSURGICAL) ×3
ELECTRODE REM PT RTRN 9FT ADLT (ELECTROSURGICAL) ×1 IMPLANT
GAUZE SPONGE 2X2 8PLY STRL LF (GAUZE/BANDAGES/DRESSINGS) ×1 IMPLANT
GEL ULTRASOUND 20GR AQUASONIC (MISCELLANEOUS) ×3 IMPLANT
GLOVE SS BIOGEL STRL SZ 7.5 (GLOVE) IMPLANT
GLOVE SUPERSENSE BIOGEL SZ 7.5 (GLOVE)
GLOVE SURG SS PI 6.5 STRL IVOR (GLOVE) ×3 IMPLANT
GLOVE SURG SS PI 7.0 STRL IVOR (GLOVE) ×3 IMPLANT
GLOVE SURG SS PI 7.5 STRL IVOR (GLOVE) ×3 IMPLANT
GOWN STRL REUS W/ TWL LRG LVL3 (GOWN DISPOSABLE) ×3 IMPLANT
GOWN STRL REUS W/TWL LRG LVL3 (GOWN DISPOSABLE) ×6
KIT BASIN OR (CUSTOM PROCEDURE TRAY) ×3 IMPLANT
KIT ROOM TURNOVER OR (KITS) ×3 IMPLANT
LOOP VESSEL MAXI BLUE (MISCELLANEOUS) ×3 IMPLANT
NEEDLE HYPO 25GX1X1/2 BEV (NEEDLE) ×3 IMPLANT
NS IRRIG 1000ML POUR BTL (IV SOLUTION) ×3 IMPLANT
PACK GENERAL/GYN (CUSTOM PROCEDURE TRAY) ×3 IMPLANT
PAD ARMBOARD 7.5X6 YLW CONV (MISCELLANEOUS) ×6 IMPLANT
SPONGE GAUZE 2X2 STER 10/PKG (GAUZE/BANDAGES/DRESSINGS) ×2
SPONGE LAP 4X18 X RAY DECT (DISPOSABLE) ×3 IMPLANT
STRIP CLOSURE SKIN 1/2X4 (GAUZE/BANDAGES/DRESSINGS) ×2 IMPLANT
SUT SILK 3 0 (SUTURE) ×2
SUT SILK 3-0 18XBRD TIE 12 (SUTURE) ×1 IMPLANT
SUT SILK 4 0 TIES 17X18 (SUTURE) ×3 IMPLANT
SUT VIC AB 3-0 SH 27 (SUTURE) ×2
SUT VIC AB 3-0 SH 27X BRD (SUTURE) ×1 IMPLANT
SUT VICRYL 4-0 PS2 18IN ABS (SUTURE) ×3 IMPLANT
SYR CONTROL 10ML LL (SYRINGE) ×3 IMPLANT
TOWEL OR 17X24 6PK STRL BLUE (TOWEL DISPOSABLE) ×3 IMPLANT
TOWEL OR 17X26 10 PK STRL BLUE (TOWEL DISPOSABLE) ×3 IMPLANT
WATER STERILE IRR 1000ML POUR (IV SOLUTION) IMPLANT

## 2014-05-09 NOTE — CV Procedure (Signed)
    OPERATIVE REPORT  DATE OF SURGERY: 05/09/2014  PATIENT: Joy Butler, 63 y.o. female MRN: [id number]  DOB: [birth year]  PRE-OPERATIVE DIAGNOSIS: Headaches with possible temporal arteritis  POST-OPERATIVE DIAGNOSIS:  Same  PROCEDURE: Right temporal artery biopsy  SURGEON:  Curt Jews, M.D.  PHYSICIAN ASSISTANT: Nurse  ANESTHESIA:  Local with sedation  EBL: Minimal ml     BLOOD ADMINISTERED: None  DRAINS: None  SPECIMEN: Right temporal artery  COUNTS CORRECT:  YES  PLAN OF CARE: PACU   PATIENT DISPOSITION:  PACU - hemodynamically stable  PROCEDURE DETAILS: The patient was taken to the operating placed supine position where the area of the right preauricular area was prepped and draped in usual sterile fashion. Anterior to the ear over the temporal pulse. This was carried down through the subcutaneous tissue and had access of the temporal artery. There was some tortuosity this. Attribute her branches of tributary of the temporal artery were ligated with 3-0 silk ties and divided. The artery was mobilized for approximately 2 cm. The artery was ligated proximally and distally and was resected. The temporal artery was sent for specimen. The wound irrigated with saline. Hemostasis daily cautery. Wounds were closed with several 3-0 Vicryl sutures the subcutaneous tissue the skin was closed with subcuticular Vicryl stitch. This was a 4-0 Vicryl. The wound was covered with benzoin and Steri-Strips   Curt Jews, M.D. 05/09/2014 12:04 PM

## 2014-05-09 NOTE — Anesthesia Postprocedure Evaluation (Signed)
  Anesthesia Post-op Note  Patient: Joy Butler  Procedure(s) Performed: Procedure(s): BIOPSY TEMPORAL ARTERY (Right)  Patient Location: PACU  Anesthesia Type:MAC  Level of Consciousness: awake, alert , oriented and patient cooperative  Airway and Oxygen Therapy: Patient Spontanous Breathing  Post-op Pain: mild  Post-op Assessment: Post-op Vital signs reviewed, Patient's Cardiovascular Status Stable, Respiratory Function Stable, Patent Airway, No signs of Nausea or vomiting and Pain level controlled  Post-op Vital Signs: stable  Last Vitals:  Filed Vitals:   05/09/14 1230  BP:   Pulse: 93  Temp:   Resp: 12    Complications: No apparent anesthesia complications

## 2014-05-09 NOTE — Telephone Encounter (Addendum)
Message copied by Gena Fray on Wed May 09, 2014  3:33 PM ------      Message from: Peter Minium K      Created: Wed May 09, 2014  2:37 PM      Regarding: Schedule                   ----- Message -----         From: Rosetta Posner, MD         Sent: 05/09/2014   2:08 PM           To: [facility] Charge Pool            Right temporal artery biopsy. Tourist information centre manager. In the office visit in 3 weeks ------  05/09/14: left detailed message for pt, dm

## 2014-05-09 NOTE — Transfer of Care (Signed)
Immediate Anesthesia Transfer of Care Note  Patient: Joy Butler  Procedure(s) Performed: Procedure(s): BIOPSY TEMPORAL ARTERY (Right)  Patient Location: PACU  Anesthesia Type:MAC  Level of Consciousness: awake, alert , oriented and patient cooperative  Airway & Oxygen Therapy: Patient Spontanous Breathing and Patient connected to nasal cannula oxygen  Post-op Assessment: Report given to PACU RN, Post -op Vital signs reviewed and stable and Patient moving all extremities X 4  Post vital signs: Reviewed and stable  Complications: No apparent anesthesia complications

## 2014-05-09 NOTE — H&P (View-Only) (Signed)
   Patient name: Joy Butler MRN: [id number] DOB: 02/02/1951 Sex: female   Referred by: [place]ala  Reason for referral:  Chief Complaint  Patient presents with  . New Evaluation    eval for temporal artery biopsy    HISTORY OF PRESENT ILLNESS: The patient presents today for consideration of temporal artery biopsy. She has had a several month history of right sided headache. She was found by her primary care physician to have tenderness over temporal artery in the issue of potential temporal arteritis was raised. CT scan of the head showed no sinusitis or other potential cause for headache. Her past history is significant for prior pulmonary embolus. Also has a history of splenic infarction a CT scan   Past Medical History  Diagnosis Date  . Diabetes mellitus     diagnosed in 2000.    . Hypertension   . Pulmonary embolism     2011 treated at [facility] in [place].  Was on Coumadin for over a year.  no known family history  . UTI (urinary tract infection)   . High cholesterol   . 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 Depression   . Neuropathy     feet    Past Surgical History  Procedure Laterality Date  . Abdominal hysterectomy    . Cholecystectomy      1980  . Esophagogastroduodenoscopy  08/19/2012    Procedure: ESOPHAGOGASTRODUODENOSCOPY (EGD);  Surgeon: James L Edwards Jr., MD;  Location: [facility];  Service: Endoscopy;  Laterality: N/A;  . Cesarean section    . Hand surgery    . Carpal tunnel release Bilateral     History   Social History  . Marital Status: Single    Spouse Name: N/A    Number of Children: N/A  . Years of Education: N/A   Occupational History  . Not on file.   Social History Main Topics  . Smoking status: Current Every Day Smoker -- 0.05 packs/day for 10 years    Types: Cigarettes  . Smokeless tobacco: Never Used     Comment: Wants patches.  . Alcohol Use: No  . Drug Use: No  . Sexual Activity: Not Currently   Other  Topics Concern  . Not on file   Social History Narrative   Previously divorced now engaged with her partner of 4 years.  Has 6 grown children with 21 grandchildren.  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  Moved to [place] in August 2013 and was transiently homeless until first part of October 2013.      Family History  Problem Relation Age of Onset  . Adopted: Yes    Allergies as of 05/08/2014 - Review Complete 05/08/2014  Allergen Reaction Noted  . Keflex [cephalexin] Itching and Swelling 08/19/2012  . Latex Itching 09/01/2012  . Sulfa antibiotics Rash 08/16/2012  . Sulfur Rash 08/16/2012    Current Outpatient Prescriptions on File Prior to Visit  Medication Sig Dispense Refill  . amLODipine (NORVASC) 10 MG tablet Take 1 tablet (10 mg total) by mouth daily.  90 tablet  3  . citalopram (CELEXA) 20 MG tablet TAKE 1 TABLET BY MOUTH DAILY  90 tablet  3  . diphenhydrAMINE-zinc acetate (BENADRYL) cream Apply topically 3 (three) times daily as needed for itching.  28.3 g  0  . EASY TOUCH INSULIN SYRINGE 31G X 5/16" 1 ML MISC USE AS DIRECTED TWICE DAILY    100 each  6  . gabapentin (NEURONTIN) 300 MG capsule Take 1 capsule (300 mg total) by mouth 3 (three) times daily.  270 capsule  1  . HYDROcodone-acetaminophen (NORCO/VICODIN) 5-325 MG per tablet Take 2 tablets by mouth every 4 (four) hours as needed.  12 tablet  0  . insulin NPH-regular Human (NOVOLIN 70/30) (70-30) 100 UNIT/ML injection Inject 36 Units into the skin 2 (two) times daily with a meal.  20 mL  11  . losartan-hydrochlorothiazide (HYZAAR) 100-25 MG per tablet TAKE 1 TABLET BY MOUTH EVERY DAY  90 tablet  3  . pravastatin (PRAVACHOL) 40 MG tablet TAKE 1 TABLET BY MOUTH EVERY EVENING  60 tablet  6  . predniSONE (DELTASONE) 50 MG tablet Take 1 tablet (50 mg total) by mouth daily.  30 tablet  0  . warfarin (COUMADIN) 5 MG tablet Take 2.5-3 tablets (12.5-15 mg total) by mouth See admin  instructions. Patient takes 12.5 mg on all days except on Monday and Thursday patient takes 58m  (patient uses 540mstrength tablets)  90 tablet  3  . Blood Glucose Monitoring Suppl (ACCU-CHEK AVIVA PLUS) W/DEVICE KIT Please check blood sugar 3 to 4 times daily. diag code 250.0. Insulin dependent  1 kit  0  . Blood Pressure KIT 1 Units by Does not apply route daily.  1 each  0  . glucose blood (ACCU-CHEK AVIVA) test strip Please check blood sugar 3 to 4 times daily. diag code 250.0. Insulin dependent  100 each  12  . Lancet Devices (ACCU-CHEK SOFTCLIX) lancets Use to check blood sugars 3 to 4 times daily. Dx code: 250.00. Insulin dependent.  100 each  0   No current facility-administered medications on file prior to visit.     REVIEW OF SYSTEMS:  Positives indicated with an "X"  CARDIOVASCULAR:  [ ] chest pain   [ ] chest pressure   [ ] palpitations   [ ] orthopnea   [ ] dyspnea on exertion   [ ] claudication   [ ] rest pain   [ ] DVT   [ ] phlebitis PULMONARY:   [ ] productive cough   [ ] asthma   [ ] wheezing NEUROLOGIC:   [ ] weakness  [ ] paresthesias  [ ] aphasia  [ ] amaurosis  [ ] dizziness HEMATOLOGIC:   [ ] bleeding problems   [ ] clotting disorders MUSCULOSKELETAL:  [ ] joint pain   [ ] joint swelling GASTROINTESTINAL: [ ]  blood in stool  [ ]  hematemesis GENITOURINARY:  [ ]  dysuria  [ ]  hematuria PSYCHIATRIC:  [ ] history of major depression INTEGUMENTARY:  [ ] rashes  [ ] ulcers CONSTITUTIONAL:  [ ] fever   [ ] chills  PHYSICAL EXAMINATION:  General: The patient is a well-nourished female, in no acute distress. Vital signs are BP 137/87  Pulse 104  Ht 5' 9" (1.753 m)  Wt 255 lb (115.667 kg)  BMI 37.64 kg/m2  SpO2 95% Pulmonary: There is a good air exchange bilaterally without wheezing or rales. Abdomen: Soft and non-tender with normal pitch bowel sounds. Musculoskeletal: There are no major deformities.  There is no significant extremity pain. Neurologic: No focal  weakness or paresthesias are detected, Skin: There are no ulcer or rashes noted. Psychiatric: The patient has normal affect. Cardiovascular: There is a regular rate and rhythm without significant murmur appreciated.  carotid arteries without bruits bilaterally  She does have mild tenderness  over her right temporal artery. She has an easily palpable temporal artery pulse and also easy palpable left temporal artery pulse with no tenderness      Impression and Plan:   Discussed with the patient. I explained our role as far as diagnosis for temporal artery biopsy for ruling out temporal arteritis as an etiology for headache. I explained this is an outpatient she under local anesthesia with sedation. And she wishes received proceed as soon as possible. We have scheduled this for tomorrow as an outpatient at [facility] Kimoni Pickerill [facility] [place]: [phone]

## 2014-05-09 NOTE — Interval H&P Note (Signed)
History and Physical Interval Note:  05/09/2014 11:06 AM  Joy Butler  has presented today for surgery, with the diagnosis of Giant cell arteritis  The various methods of treatment have been discussed with the patient and family. After consideration of risks, benefits and other options for treatment, the patient has consented to  Procedure(s): BIOPSY TEMPORAL ARTERY (Right) as a surgical intervention .  The patient's history has been reviewed, patient examined, no change in status, stable for surgery.  I have reviewed the patient's chart and labs.  Questions were answered to the patient's satisfaction.     Arvilla Meres Early

## 2014-05-09 NOTE — Anesthesia Preprocedure Evaluation (Signed)
Anesthesia Evaluation  Patient identified by MRN, date of birth, ID band Patient awake    Reviewed: Allergy & Precautions, H&P , NPO status   Airway Mallampati: II TM Distance: >3 FB Neck ROM: Full    Dental  (+) Edentulous Lower, Edentulous Upper   Pulmonary Current Smoker,          Cardiovascular hypertension, Pt. on medications + Peripheral Vascular Disease  History of PE; Coumadin therapy stopped 04/30/14   Neuro/Psych  Headaches, Depression  Neuromuscular disease    GI/Hepatic   Endo/Other  diabetes, Type 2, Insulin Dependent  Renal/GU Renal InsufficiencyRenal disease     Musculoskeletal   Abdominal   Peds  Hematology   Anesthesia Other Findings   Reproductive/Obstetrics                           Anesthesia Physical Anesthesia Plan  ASA: III  Anesthesia Plan: MAC   Post-op Pain Management:    Induction:   Airway Management Planned: Nasal Cannula  Additional Equipment:   Intra-op Plan:   Post-operative Plan:   Informed Consent: I have reviewed the patients History and Physical, chart, labs and discussed the procedure including the risks, benefits and alternatives for the proposed anesthesia with the patient or authorized representative who has indicated his/her understanding and acceptance.   Dental advisory given  Plan Discussed with:   Anesthesia Plan Comments:         Anesthesia Quick Evaluation

## 2014-05-11 ENCOUNTER — Encounter (HOSPITAL_COMMUNITY): Payer: Self-pay | Admitting: Vascular Surgery

## 2014-05-16 ENCOUNTER — Ambulatory Visit (INDEPENDENT_AMBULATORY_CARE_PROVIDER_SITE_OTHER): Payer: Medicare Other | Admitting: Internal Medicine

## 2014-05-16 VITALS — BP 135/80 | HR 103 | Temp 98.3°F | Ht 69.0 in | Wt 257.7 lb

## 2014-05-16 DIAGNOSIS — M316 Other giant cell arteritis: Secondary | ICD-10-CM

## 2014-05-16 DIAGNOSIS — I2699 Other pulmonary embolism without acute cor pulmonale: Secondary | ICD-10-CM | POA: Diagnosis not present

## 2014-05-16 DIAGNOSIS — E1142 Type 2 diabetes mellitus with diabetic polyneuropathy: Secondary | ICD-10-CM | POA: Diagnosis not present

## 2014-05-16 DIAGNOSIS — Z86711 Personal history of pulmonary embolism: Secondary | ICD-10-CM

## 2014-05-16 DIAGNOSIS — G8929 Other chronic pain: Secondary | ICD-10-CM | POA: Diagnosis not present

## 2014-05-16 DIAGNOSIS — M7989 Other specified soft tissue disorders: Secondary | ICD-10-CM | POA: Diagnosis not present

## 2014-05-16 DIAGNOSIS — F329 Major depressive disorder, single episode, unspecified: Secondary | ICD-10-CM | POA: Diagnosis not present

## 2014-05-16 DIAGNOSIS — H4900 Third [oculomotor] nerve palsy, unspecified eye: Secondary | ICD-10-CM | POA: Diagnosis not present

## 2014-05-16 DIAGNOSIS — F3289 Other specified depressive episodes: Secondary | ICD-10-CM | POA: Diagnosis not present

## 2014-05-16 DIAGNOSIS — L538 Other specified erythematous conditions: Secondary | ICD-10-CM | POA: Diagnosis not present

## 2014-05-16 DIAGNOSIS — L304 Erythema intertrigo: Secondary | ICD-10-CM | POA: Insufficient documentation

## 2014-05-16 DIAGNOSIS — F32A Depression, unspecified: Secondary | ICD-10-CM

## 2014-05-16 DIAGNOSIS — R197 Diarrhea, unspecified: Secondary | ICD-10-CM

## 2014-05-16 DIAGNOSIS — I10 Essential (primary) hypertension: Secondary | ICD-10-CM | POA: Diagnosis not present

## 2014-05-16 DIAGNOSIS — E1149 Type 2 diabetes mellitus with other diabetic neurological complication: Secondary | ICD-10-CM | POA: Diagnosis not present

## 2014-05-16 DIAGNOSIS — E119 Type 2 diabetes mellitus without complications: Secondary | ICD-10-CM

## 2014-05-16 DIAGNOSIS — M549 Dorsalgia, unspecified: Secondary | ICD-10-CM | POA: Diagnosis not present

## 2014-05-16 DIAGNOSIS — R21 Rash and other nonspecific skin eruption: Secondary | ICD-10-CM

## 2014-05-16 DIAGNOSIS — Z1239 Encounter for other screening for malignant neoplasm of breast: Secondary | ICD-10-CM

## 2014-05-16 DIAGNOSIS — Z0000 Encounter for general adult medical examination without abnormal findings: Secondary | ICD-10-CM

## 2014-05-16 LAB — GLUCOSE, CAPILLARY: Glucose-Capillary: 390 mg/dL — ABNORMAL HIGH (ref 70–99)

## 2014-05-16 LAB — POCT GLYCOSYLATED HEMOGLOBIN (HGB A1C): Hemoglobin A1C: 13.3

## 2014-05-16 MED ORDER — METFORMIN HCL 500 MG PO TABS: 500.0000 mg | ORAL_TABLET | Freq: Two times a day (BID) | ORAL | Status: DC

## 2014-05-16 MED ORDER — INSULIN NPH ISOPHANE & REGULAR (70-30) 100 UNIT/ML ~~LOC~~ SUSP: 40.0000 [IU] | Freq: Two times a day (BID) | SUBCUTANEOUS | Status: DC

## 2014-05-16 MED ORDER — LOPERAMIDE HCL 2 MG PO CAPS: 2.0000 mg | ORAL_CAPSULE | ORAL | Status: DC | PRN

## 2014-05-16 MED ORDER — CLOTRIMAZOLE 1 % EX CREA: 1.0000 "application " | TOPICAL_CREAM | Freq: Two times a day (BID) | CUTANEOUS | Status: DC

## 2014-05-16 MED ORDER — PREDNISONE 5 MG PO TABS: 10.0000 mg | ORAL_TABLET | Freq: Every day | ORAL | Status: DC

## 2014-05-16 NOTE — Patient Instructions (Addendum)
General Instructions: Please take Prednisone 68m daily for 2 weeks (from today 05/16/2014 to 05/30/2014)  Then take Prednisone 40 mg daily for 2 weeks days (from 05/31/2014 to 06/13/2014) Then take Prednisone 35 mg daily for 2 weeks (from 06/14/2014 to 06/28/2014) Then take Prednisone 30 mg daily for 2 weeks, (from 06/29/2014 to 07/13/2014). I will see you before we can decreased it further.  Please start taking Metformin 500 mg once daily for one week, then increase to 500 mg twice daily  Please increase insulin to 40 units twice daily   Treatment Goals:  Goals (1 Years of Data) as of 05/16/14         As of Today 05/09/14 05/09/14 05/09/14 05/09/14     Blood Pressure    . Blood Pressure < 120/80  135/80 133/53 127/73 145/78 133/80     Result Component    . HEMOGLOBIN A1C < 7.0  13.3        . LDL CALC < 70            Progress Toward Treatment Goals:  Treatment Goal 05/16/2014  Hemoglobin A1C unchanged  Blood pressure at goal  Stop smoking smoking the same amount  Prevent falls -    Self Care Goals & Plans:  Self Care Goal 05/16/2014  Manage my medications take my medicines as prescribed; bring my medications to every visit; refill my medications on time; follow the sick day instructions if I am sick  Monitor my health keep track of my blood glucose; bring my glucose meter and log to each visit; keep track of my blood pressure  Eat healthy foods -  Be physically active -  Stop smoking -    Home Blood Glucose Monitoring 05/16/2014  Check my blood sugar 3 times a day  When to check my blood sugar before breakfast; before lunch; before dinner     Care Management & Community Referrals:  Referral 02/17/2013  [facility]    Metformin tablets What is this medicine? METFORMIN (met FOR min) is used to treat type 2 diabetes. It helps to control blood sugar. Treatment is combined with diet and exercise. This medicine can be used  alone or with other medicines for diabetes. This medicine may be used for other purposes; ask your health care provider or pharmacist if you have questions. COMMON BRAND NAME(S): Glucophage What should I tell my health care provider before I take this medicine? They need to know if you have any of these conditions: -anemia -frequently drink alcohol-containing beverages -become easily dehydrated -heart attack -heart failure that is treated with medications -kidney disease -liver disease -polycystic ovary syndrome -serious infection or injury -vomiting -an unusual or allergic reaction to metformin, other medicines, foods, dyes, or preservatives -pregnant or trying to get pregnant -breast-feeding How should I use this medicine? Take this medicine by mouth. Take it with meals. Swallow the tablets with a drink of water. Follow the directions on the prescription label. Take your medicine at regular intervals. Do not take your medicine more often than directed. Talk to your pediatrician regarding the use of this medicine in children. While this drug may be prescribed for children as young as 174years of age for selected conditions, precautions do apply. Overdosage: If you think you have taken too much of this medicine contact a poison control center or emergency room at once. NOTE: This medicine is only for you. Do not share this medicine with others. What  if I miss a dose? If you miss a dose, take it as soon as you can. If it is almost time for your next dose, take only that dose. Do not take double or extra doses. What may interact with this medicine? Do not take this medicine with any of the following medications: -dofetilide -gatifloxacin -certain contrast medicines given before X-rays, CT scans, MRI, or other procedures This medicine may also interact with the following medications: -digoxin -diuretics -female hormones, like estrogens or progestins and birth control  pills -isoniazid -medicines for blood pressure, heart disease, irregular heart beat -morphine -nicotinic acid -phenothiazines like chlorpromazine, mesoridazine, prochlorperazine, thioridazine -phenytoin -procainamide -quinidine -quinine -ranitidine -steroid medicines like prednisone or cortisone -stimulant medicines for attention disorders, weight loss, or to stay awake -thyroid medicines -trimethoprim -vancomycin This list may not describe all possible interactions. Give your health care provider a list of all the medicines, herbs, non-prescription drugs, or dietary supplements you use. Also tell them if you smoke, drink alcohol, or use illegal drugs. Some items may interact with your medicine. What should I watch for while using this medicine? Visit your doctor or health care professional for regular checks on your progress. A test called the HbA1C (A1C) will be monitored. This is a simple blood test. It measures your blood sugar control over the last 2 to 3 months. You will receive this test every 3 to 6 months. Learn how to check your blood sugar. Learn the symptoms of low and high blood sugar and how to manage them. Always carry a quick-source of sugar with you in case you have symptoms of low blood sugar. Examples include hard sugar candy or glucose tablets. Make sure others know that you can choke if you eat or drink when you develop serious symptoms of low blood sugar, such as seizures or unconsciousness. They must get medical help at once. Tell your doctor or health care professional if you have high blood sugar. You might need to change the dose of your medicine. If you are sick or exercising more than usual, you might need to change the dose of your medicine. Do not skip meals. Ask your doctor or health care professional if you should avoid alcohol. Many nonprescription cough and cold products contain sugar or alcohol. These can affect blood sugar. This medicine may cause ovulation  in premenopausal women who do not have regular monthly periods. This may increase your chances of becoming pregnant. You should not take this medicine if you become pregnant or think you may be pregnant. Talk with your doctor or health care professional about your birth control options while taking this medicine. Contact your doctor or health care professional right away if think you are pregnant. If you are going to need surgery, a MRI, CT scan, or other procedure, tell your doctor that you are taking this medicine. You may need to stop taking this medicine before the procedure. Wear a medical ID bracelet or chain, and carry a card that describes your disease and details of your medicine and dosage times. What side effects may I notice from receiving this medicine? Side effects that you should report to your doctor or health care professional as soon as possible: -allergic reactions like skin rash, itching or hives, swelling of the face, lips, or tongue -breathing problems -feeling faint or lightheaded, falls -muscle aches or pains -signs and symptoms of low blood sugar such as feeling anxious, confusion, dizziness, increased hunger, unusually weak or tired, sweating, shakiness, cold, irritable, headache, blurred  vision, fast heartbeat, loss of consciousness -slow or irregular heartbeat -unusual stomach pain or discomfort -unusually tired or weak Side effects that usually do not require medical attention (report to your doctor or health care professional if they continue or are bothersome): -diarrhea -headache -heartburn -metallic taste in mouth -nausea -stomach gas, upset This list may not describe all possible side effects. Call your doctor for medical advice about side effects. You may report side effects to FDA at 1-800-FDA-1088. Where should I keep my medicine? Keep out of the reach of children. Store at room temperature between 15 and 30 degrees C (59 and 86 degrees F). Protect from  moisture and light. Throw away any unused medicine after the expiration date. NOTE: This sheet is a summary. It may not cover all possible information. If you have questions about this medicine, talk to your doctor, pharmacist, or health care provider.  2014, Elsevier/Gold Standard. (2013-03-21 16:03:44)

## 2014-05-17 NOTE — Assessment & Plan Note (Signed)
Assessment: The etiology of her diarrhea is unclear at this point.  Ddx: osmotic versus infectious - but suspicion for this is low. Irritable bowel syndrome is also on my differential. I don't suspect any of her medications. She does not meet the criteria for diagnostic workup. Counseled her report any blood, increased episodes of fevers, or any other danger signs. Patient appears to be well rehydrated.  Plan: 1. Labs/imaging: None 2. Therapy: Imodium when necessary 3. Follow up: Will follow up in one month. If required, will consider evaluation with diagnostic workup.  - However, am starting the patient on metformin for her diabetes, and this might worsen her diarrhea. Will see how she responds if diarrhea worsens, we'll have to discontinue this medication.

## 2014-05-17 NOTE — Progress Notes (Signed)
Patient ID: Joy Butler, female   DOB: 1951-05-25, 63 y.o.   MRN: [id number]   Subjective:   HPI: Joy Butler is a 63 y.o. woman with past medical history of uncontrolled diabetes, hypertension, history of pulmonary embolism on chronic Coumadin therapy, presents to the clinic for routine follow up with multiple complaints as below.  Temporal arteritis: Last office visit was 04/05/2014 by Dr Carmon Sails for which she was diagnosed with clinical temporal arteritis. She underwent artery biopsy. Pathology is negative for temporal arteritis. She reports that she has been evaluated by an ophthalmologist, who did not detect "eye problems" besides mild cataracts per the patient. Her vision has been stable since last OV. She seems to be confused about her steroid regimen, which was prescribed on 04/05/2014. She was supposed to take prednisone 50 mg daily. Contacted her pharmacy who revealed that she filled her prescription on 04/16/2014 which was 11 days after her visit. I am not sure when actually she started taking the medication. Her pill bottle, which he presents today reveals at least any pills still in the bottle. Symptom-wise, patient reports some improvement since starting steroids.  Diarrhea: She also reports history of diarrhea over the last 4 weeks. It is nonbloody, watery, nonmucoid, and tends to increase postprandial. No abdominal pain, hematemesis, emesis, or rectal bleeding. She reports that she gets about 4-5 episodes during day time and 3 episodes at night time. She denies any constitutional symptoms associated with her diarrhea.  Left lower extremity swelling: Patient reports that she has not started her left leg to be increasingly swollen with a tightness, and left calf muscle pain. No recent long distance trip. However, patient has significant past medical history of hypocoagulable state. She is currently nontherapeutic on Coumadin. Has had significant issues with compliance and INR  checks. No active chest pain or shortness of breath.  Breast rash: She requests for some treatment for a rash under both her breasts, which she has notes over the past several days. It is not itchy, and has not spread to involve other areas of the body.  Low libido: Patient reports that she suspects one of her medications causing a reduction in her sexual drive. She requests me to review her medications today. Celexa has causes low libido in 1-4% of patients. However, this Celexa has controlled her depression, and she wishes to continue despite the possibility of this side effect. I reviewed all other medications, which did not indicate this is a side effect.   DM2, uncontrolled -  Lab Results  Component Value Date   HGBA1C 13.3 05/16/2014   She infrequently checks her blood sugar due to fear of needle prick. However, she reports that her compliance with insulin, and significantly improved. Currently taking NPH-regular 70/30 36 units twice a day. Patient misses doses 0-1 x per week on average.  0 hypoglycemic episodes since last visit. denies polyuria, polydipsia, nausea, vomiting, diarrhea.  does request refills today.  In regards to diabetic complications:  Microvascular complications: Confirms: peripheral neuropathy ;    Important diabetic medications: Is patient on aspirin? No. Patient takes coumadin  Is patient on a statin? Yes Is patient on an ACE-I/ ARB? Yes  Kindly see the A&P for the status of the pt's chronic medical problems.   Past Medical History  Diagnosis Date  . Diabetes mellitus     diagnosed in 2000.    Marland Kitchen Hypertension   . Pulmonary embolism     2011 treated at [facility] in [place].  Was on Coumadin for over a year.  no known family history  . UTI (urinary tract infection)   . High cholesterol   . 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 Depression   . Neuropathy     feet  . Pneumonia   . Kidney stones   . Headache(784.0)     migraines ( 12 years ago)    Current Outpatient Prescriptions  Medication Sig Dispense Refill  . acetaminophen (TYLENOL) 500 MG tablet Take 1,000 mg by mouth every 6 (six) hours as needed for moderate pain.      Marland Kitchen amLODipine (NORVASC) 10 MG tablet Take 1 tablet (10 mg total) by mouth daily.  90 tablet  3  . Blood Glucose Monitoring Suppl (ACCU-CHEK AVIVA PLUS) W/DEVICE KIT Please check blood sugar 3 to 4 times daily. diag code 250.0. Insulin dependent  1 kit  0  . Blood Pressure KIT 1 Units by Does not apply route daily.  1 each  0  . citalopram (CELEXA) 20 MG tablet Take 20 mg by mouth daily.      Marland Kitchen EASY TOUCH INSULIN SYRINGE 31G X 5/16" 1 ML MISC USE AS DIRECTED TWICE DAILY  100 each  6  . gabapentin (NEURONTIN) 300 MG capsule Take 1 capsule (300 mg total) by mouth 3 (three) times daily.  270 capsule  1  . glucose blood (ACCU-CHEK AVIVA) test strip Please check blood sugar 3 to 4 times daily. diag code 250.0. Insulin dependent  100 each  12  . HYDROcodone-acetaminophen (NORCO/VICODIN) 5-325 MG per tablet Take 2 tablets by mouth every 4 (four) hours as needed.  12 tablet  0  . insulin NPH-regular Human (NOVOLIN 70/30) (70-30) 100 UNIT/ML injection Inject 40 Units into the skin 2 (two) times daily with a meal.  20 mL  11  . Lancet Devices (ACCU-CHEK SOFTCLIX) lancets Use to check blood sugars 3 to 4 times daily. Dx code: 250.00. Insulin dependent.  100 each  0  . losartan-hydrochlorothiazide (HYZAAR) 100-25 MG per tablet Take 1 tablet by mouth daily.      Marland Kitchen oxyCODONE-acetaminophen (ROXICET) 5-325 MG per tablet Take 1 tablet by mouth every 4 (four) hours as needed for severe pain.  30 tablet  0  . pravastatin (PRAVACHOL) 40 MG tablet Take 40 mg by mouth daily.      Marland Kitchen warfarin (COUMADIN) 5 MG tablet Take 2.5-3 tablets (12.5-15 mg total) by mouth See admin instructions. Patient takes 12.5 mg on all days except on Monday and Thursday patient takes 67m  (patient uses 552mstrength tablets)  90 tablet  3  . clotrimazole  (LOTRIMIN) 1 % cream Apply 1 application topically 2 (two) times daily.  30 g  0  . loperamide (IMODIUM) 2 MG capsule Take 1 capsule (2 mg total) by mouth as needed for diarrhea or loose stools.  30 capsule  0  . metFORMIN (GLUCOPHAGE) 500 MG tablet Take 1 tablet (500 mg total) by mouth 2 (two) times daily with a meal.  30 tablet  1  . predniSONE (DELTASONE) 5 MG tablet Take 2 tablets (10 mg total) by mouth daily with breakfast.  200 tablet  0   No current facility-administered medications for this visit.   Family History  Problem Relation Age of Onset  . Adopted: Yes  . Hypertension Mother    History   Social History  . Marital Status: Single    Spouse Name: N/A    Number of Children:  N/A  . Years of Education: N/A   Social History Main Topics  . Smoking status: Current Every Day Smoker -- 0.05 packs/day for 10 years    Types: Cigarettes  . Smokeless tobacco: Never Used     Comment: Wants patches.  . Alcohol Use: No  . Drug Use: No  . Sexual Activity: Not Currently   Other Topics Concern  . Not on file   Social History Narrative   Previously divorced now engaged with her partner of 4 years.  Has 6 grown children with 21 grandchildren.  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  Moved to [place] in August 2013 and was transiently homeless until first part of October 2013.     Review of Systems: Constitutional: Denies fever, chills, diaphoresis, appetite change and fatigue.  Respiratory: Denies SOB, DOE, cough, chest tightness, and wheezing.  Cardiovascular: No chest pain, palpitations and leg swelling.  Gastrointestinal: No abdominal pain, nausea, vomiting, bloody stools Genitourinary: No dysuria, frequency, hematuria, or flank pain.  Musculoskeletal:  chronic back pain, and pain in her extremities associated with weakness.  Psych: Reports significant improvement in her depression symptoms with Celexa.  Objective:  Physical  Exam: Filed Vitals:   05/16/14 1419  BP: 135/80  Pulse: 103  Temp: 98.3 F (36.8 C)  TempSrc: Oral  Height: 5' 9"  (1.753 m)  Weight: 257 lb 11.2 oz (116.892 kg)  SpO2: 94%   General: Well nourished. No acute distress. Husband/boyfriend in exam room.  Lungs: CTA bilaterally. Heart: RRR; no extra sounds or murmurs  Abdomen: Non-distended, normal BS, soft, nontender; no hepatosplenomegaly  Extremities: No pedal edema. No joint swelling or tenderness. Left lower extremity without clinical evidence of sweating, or any signs of inflammation or infection. Gentle squeeze of the calf elicits mild tenderness. Pulses are present and strong bilaterally. Neurologic: Alert and oriented x3. No obvious neurologic deficits.  Assessment & Plan:  I have discussed my assessment and plan  with Dr. Lynnae January  as detailed under problem based charting.

## 2014-05-17 NOTE — Assessment & Plan Note (Signed)
Given the patient's noncompliance with Coumadin and subtherapeutic INR, will consider newer oral anticoagulants. I discussed this with Dr. Elie Confer

## 2014-05-17 NOTE — Assessment & Plan Note (Signed)
The rash on her breasts have a clinical appearance of intertrigo. I will send in a clotrimazole cream 1%.

## 2014-05-17 NOTE — Assessment & Plan Note (Signed)
Given the patient's prior history of thromboembolism, I will go ahead and order a bilateral lower extremity duplex to rule out DVT even though, physical examination findings do not clearly indicate a possibility of DVT. Patient is consistently subtherapeutic on her Coumadin.  plan -Lower extremity venous duplex

## 2014-05-17 NOTE — Assessment & Plan Note (Addendum)
The patient reports improvement on steroids. However, there is some confusion about her regimen and the patient is unsure about the dosage and when she started after taking the pills. Plan. -Has been evaluated by ophthalmologist per patient, but verbal report - I will start a steroid taper at a 10% reduction of the week  ( Prednisone 45mg  daily for 2 weeks (from today 05/16/2014 to 05/30/2014)  Then Prednisone 40 mg daily for 2 weeks days (from 05/31/2014 to 06/13/2014) Then  Prednisone 35 mg daily for 2 weeks (from 06/14/2014 to 06/28/2014) Then Prednisone 30 mg daily for 2 weeks, (from 06/29/2014 to 07/13/2014)) -The plan will be to see her while she is continuing with this regimen when she follows up with me for DM within one month. At that, time she'll be still taking the 40 mg daily or 35 mg daily. I will titrate it further to a dose of 10 mg. Then prednisone taper will be slowed substantially, such that patients remain on some prednisone (in progressively lower doses) for 9 to 12 months - starting sometime in February 2016. After that,  we can start tapering in 1 mg decrements per month once the daily dose is less than 10 mg is appropriate. - Will change her pills 5 mg formulation in order to accommodate this complex regimen. - I will send a message to [facility], our CSW to arrange for [facility], and HHRN to assist with her medication.

## 2014-05-17 NOTE — Assessment & Plan Note (Signed)
Will perform a mammogram

## 2014-05-17 NOTE — Assessment & Plan Note (Signed)
Lab Results  Component Value Date   HGBA1C 13.3 05/16/2014   HGBA1C 13.7 02/07/2014   HGBA1C 10.4 08/14/2013     Assessment: Diabetes control: poor control (HgbA1C >9%) Progress toward A1C goal:  unchanged Comments: She reports improved compliance with insulin  Plan: Medications:  I will increase her insulin dose from NPH 70/30 36 units bid to 40 untis bid. I will add metformin 500 mgtwice a day (added due to patient's noncompliance with insulin, and very high A1c). She'll start by taking 500 mg daily for one week before she escalates the dose to 500.  Home glucose monitoring: Frequency: 3 times a day Timing: before breakfast;before lunch;before dinner Instruction/counseling given: reminded to get eye exam, reminded to bring medications to each visit and discussed diet Educational resources provided:   Self management tools provided:   Other plans:  Will see how she responds with Metformin especially give her h/o diarrhea.

## 2014-05-17 NOTE — Assessment & Plan Note (Signed)
BP Readings from Last 3 Encounters:  05/16/14 135/80  05/09/14 133/53  05/09/14 133/53    Lab Results  Component Value Date   NA 142 02/21/2014   K 3.9 02/21/2014   CREATININE 1.09 02/21/2014    Assessment: Blood pressure control: controlled Progress toward BP goal:  at goal Comments: none  Plan: Medications:  continue current medications Educational resources provided:   Self management tools provided:   Other plans: well controlled BP

## 2014-05-18 ENCOUNTER — Encounter: Payer: Self-pay | Admitting: Licensed Clinical Social Worker

## 2014-05-18 NOTE — Progress Notes (Signed)
Joy Butler was referred to CSW as pt is in need of additional nursing services with medications physician added during most recent [facility] appointment.  CSW placed call to Ms. Durocher to confirm services currently receiving.  Message left.  CSW sent message to [facility] and [facility] to inquire if pt was still active.  Awaiting response.

## 2014-05-21 ENCOUNTER — Telehealth: Payer: Self-pay | Admitting: *Deleted

## 2014-05-21 MED ORDER — LOPERAMIDE HCL 2 MG PO CAPS: 2.0000 mg | ORAL_CAPSULE | Freq: Two times a day (BID) | ORAL | Status: DC | PRN

## 2014-05-21 NOTE — Addendum Note (Signed)
Addended by: Jessee Avers on: 05/21/2014 11:14 AM   Modules accepted: Orders

## 2014-05-21 NOTE — Progress Notes (Signed)
05/21/14: Discussion with [facility] care manager.  Pt is still active with [facility] care management.  Care Manager, J. Juleen China will inquire if [facility] Pharmacist and see patient this week, while [facility] care manager will follow up next week to provide support to Joy Butler.  CSW will follow up with [facility] regarding [place] RN.

## 2014-05-21 NOTE — Telephone Encounter (Signed)
I have clarified it as 2 mg bid prn. #30 pills.  Thanks Glenda.

## 2014-05-21 NOTE — Addendum Note (Signed)
Addended by: Jessee Avers on: 05/21/2014 01:02 PM   Modules accepted: Orders

## 2014-05-21 NOTE — Telephone Encounter (Signed)
Call from [place] - states needs clarification of directions for Imodium i.e. How often, how many daily or up to how may daily? Instead of  "as needed"  Thanks

## 2014-05-22 DIAGNOSIS — IMO0001 Reserved for inherently not codable concepts without codable children: Secondary | ICD-10-CM | POA: Diagnosis not present

## 2014-05-22 DIAGNOSIS — Z86711 Personal history of pulmonary embolism: Secondary | ICD-10-CM | POA: Diagnosis not present

## 2014-05-22 DIAGNOSIS — Z7901 Long term (current) use of anticoagulants: Secondary | ICD-10-CM | POA: Diagnosis not present

## 2014-05-22 DIAGNOSIS — Z794 Long term (current) use of insulin: Secondary | ICD-10-CM | POA: Diagnosis not present

## 2014-05-22 DIAGNOSIS — I10 Essential (primary) hypertension: Secondary | ICD-10-CM | POA: Diagnosis not present

## 2014-05-22 DIAGNOSIS — M316 Other giant cell arteritis: Secondary | ICD-10-CM | POA: Diagnosis not present

## 2014-05-23 ENCOUNTER — Other Ambulatory Visit: Payer: Self-pay | Admitting: Internal Medicine

## 2014-05-23 NOTE — Progress Notes (Signed)
Case discussed with Dr. Kazibwe soon after the resident saw the patient.  We reviewed the resident's history and exam and pertinent patient test results.  I agree with the assessment, diagnosis, and plan of care documented in the resident's note. 

## 2014-05-24 ENCOUNTER — Telehealth: Payer: Self-pay | Admitting: *Deleted

## 2014-05-24 DIAGNOSIS — M316 Other giant cell arteritis: Secondary | ICD-10-CM | POA: Diagnosis not present

## 2014-05-24 DIAGNOSIS — I10 Essential (primary) hypertension: Secondary | ICD-10-CM | POA: Diagnosis not present

## 2014-05-24 DIAGNOSIS — IMO0001 Reserved for inherently not codable concepts without codable children: Secondary | ICD-10-CM | POA: Diagnosis not present

## 2014-05-24 DIAGNOSIS — Z794 Long term (current) use of insulin: Secondary | ICD-10-CM | POA: Diagnosis not present

## 2014-05-24 DIAGNOSIS — Z86711 Personal history of pulmonary embolism: Secondary | ICD-10-CM | POA: Diagnosis not present

## 2014-05-24 DIAGNOSIS — Z7901 Long term (current) use of anticoagulants: Secondary | ICD-10-CM | POA: Diagnosis not present

## 2014-05-24 NOTE — Telephone Encounter (Signed)
Joy Butler with [facility] called [phone] - pt just started 05/22/14 taking Metformin once a day for one week then increase. Has not been checking CBG - Joy Butler checked today 392 and had not taken her diabetes med. Pt c/o of thirst and freq urination. Joy Butler talked with pt about checking CBG and taking meds. Hilda Blades Von Quint[place] RN 05/24/14 4:10PM

## 2014-05-24 NOTE — Telephone Encounter (Signed)
Thanks, Debbie

## 2014-05-25 ENCOUNTER — Emergency Department (HOSPITAL_COMMUNITY): Payer: Medicare Other

## 2014-05-25 ENCOUNTER — Inpatient Hospital Stay (HOSPITAL_COMMUNITY)
Admission: EM | Admit: 2014-05-25 | Discharge: 2014-05-27 | DRG: 638 | Disposition: A | Discharge status: Home / Self Care | Payer: Medicare Other | Attending: Internal Medicine | Admitting: Internal Medicine

## 2014-05-25 ENCOUNTER — Encounter (HOSPITAL_COMMUNITY): Payer: Self-pay | Admitting: Emergency Medicine

## 2014-05-25 DIAGNOSIS — R7301 Impaired fasting glucose: Secondary | ICD-10-CM | POA: Diagnosis not present

## 2014-05-25 DIAGNOSIS — E7800 Pure hypercholesterolemia, unspecified: Secondary | ICD-10-CM | POA: Diagnosis present

## 2014-05-25 DIAGNOSIS — IMO0001 Reserved for inherently not codable concepts without codable children: Secondary | ICD-10-CM | POA: Diagnosis not present

## 2014-05-25 DIAGNOSIS — I10 Essential (primary) hypertension: Secondary | ICD-10-CM | POA: Diagnosis not present

## 2014-05-25 DIAGNOSIS — Z91199 Patient's noncompliance with other medical treatment and regimen due to unspecified reason: Secondary | ICD-10-CM

## 2014-05-25 DIAGNOSIS — IMO0002 Reserved for concepts with insufficient information to code with codable children: Secondary | ICD-10-CM

## 2014-05-25 DIAGNOSIS — F32A Depression, unspecified: Secondary | ICD-10-CM | POA: Diagnosis present

## 2014-05-25 DIAGNOSIS — E1100 Type 2 diabetes mellitus with hyperosmolarity without nonketotic hyperglycemic-hyperosmolar coma (NKHHC): Principal | ICD-10-CM | POA: Diagnosis present

## 2014-05-25 DIAGNOSIS — M316 Other giant cell arteritis: Secondary | ICD-10-CM

## 2014-05-25 DIAGNOSIS — E119 Type 2 diabetes mellitus without complications: Secondary | ICD-10-CM

## 2014-05-25 DIAGNOSIS — Z9119 Patient's noncompliance with other medical treatment and regimen: Secondary | ICD-10-CM

## 2014-05-25 DIAGNOSIS — Z7901 Long term (current) use of anticoagulants: Secondary | ICD-10-CM

## 2014-05-25 DIAGNOSIS — E1110 Type 2 diabetes mellitus with ketoacidosis without coma: Secondary | ICD-10-CM | POA: Diagnosis not present

## 2014-05-25 DIAGNOSIS — I2782 Chronic pulmonary embolism: Secondary | ICD-10-CM | POA: Diagnosis present

## 2014-05-25 DIAGNOSIS — I2699 Other pulmonary embolism without acute cor pulmonale: Secondary | ICD-10-CM | POA: Diagnosis present

## 2014-05-25 DIAGNOSIS — M7989 Other specified soft tissue disorders: Secondary | ICD-10-CM

## 2014-05-25 DIAGNOSIS — F329 Major depressive disorder, single episode, unspecified: Secondary | ICD-10-CM | POA: Diagnosis present

## 2014-05-25 DIAGNOSIS — F3289 Other specified depressive episodes: Secondary | ICD-10-CM | POA: Diagnosis present

## 2014-05-25 DIAGNOSIS — F17200 Nicotine dependence, unspecified, uncomplicated: Secondary | ICD-10-CM | POA: Diagnosis present

## 2014-05-25 DIAGNOSIS — E785 Hyperlipidemia, unspecified: Secondary | ICD-10-CM | POA: Diagnosis present

## 2014-05-25 DIAGNOSIS — M79609 Pain in unspecified limb: Secondary | ICD-10-CM | POA: Diagnosis not present

## 2014-05-25 DIAGNOSIS — Z7982 Long term (current) use of aspirin: Secondary | ICD-10-CM | POA: Diagnosis not present

## 2014-05-25 DIAGNOSIS — R7309 Other abnormal glucose: Secondary | ICD-10-CM | POA: Diagnosis not present

## 2014-05-25 DIAGNOSIS — N179 Acute kidney failure, unspecified: Secondary | ICD-10-CM | POA: Diagnosis not present

## 2014-05-25 DIAGNOSIS — R0989 Other specified symptoms and signs involving the circulatory and respiratory systems: Secondary | ICD-10-CM | POA: Diagnosis not present

## 2014-05-25 LAB — I-STAT CHEM 8, ED
BUN: 29 mg/dL — ABNORMAL HIGH (ref 6–23)
BUN: 29 mg/dL — AB (ref 6–23)
CALCIUM ION: 1.19 mmol/L (ref 1.13–1.30)
CALCIUM ION: 1.25 mmol/L (ref 1.13–1.30)
CHLORIDE: 101 meq/L (ref 96–112)
Chloride: 96 mEq/L (ref 96–112)
Creatinine, Ser: 1.5 mg/dL — ABNORMAL HIGH (ref 0.50–1.10)
Creatinine, Ser: 1.5 mg/dL — ABNORMAL HIGH (ref 0.50–1.10)
Glucose, Bld: 526 mg/dL — ABNORMAL HIGH (ref 70–99)
Glucose, Bld: 407 mg/dL — ABNORMAL HIGH (ref 70–99)
HCT: 48 % — ABNORMAL HIGH (ref 36.0–46.0)
HEMATOCRIT: 47 % — AB (ref 36.0–46.0)
Hemoglobin: 16.3 g/dL — ABNORMAL HIGH (ref 12.0–15.0)
Hemoglobin: 16 g/dL — ABNORMAL HIGH (ref 12.0–15.0)
POTASSIUM: 4.1 meq/L (ref 3.7–5.3)
Potassium: 4.2 mEq/L (ref 3.7–5.3)
SODIUM: 137 meq/L (ref 137–147)
Sodium: 132 mEq/L — ABNORMAL LOW (ref 137–147)
TCO2: 23 mmol/L (ref 0–100)
TCO2: 25 mmol/L (ref 0–100)

## 2014-05-25 LAB — CBG MONITORING, ED
GLUCOSE-CAPILLARY: 439 mg/dL — AB (ref 70–99)
GLUCOSE-CAPILLARY: 396 mg/dL — AB (ref 70–99)

## 2014-05-25 LAB — URINALYSIS, ROUTINE W REFLEX MICROSCOPIC
BILIRUBIN URINE: NEGATIVE
Ketones, ur: NEGATIVE mg/dL
Nitrite: NEGATIVE
PH: 5.5 (ref 5.0–8.0)
Protein, ur: 100 mg/dL — AB
Specific Gravity, Urine: 1.028 (ref 1.005–1.030)
Urobilinogen, UA: 0.2 mg/dL (ref 0.0–1.0)

## 2014-05-25 LAB — CBC WITH DIFFERENTIAL/PLATELET
Basophils Absolute: 0 10*3/uL (ref 0.0–0.1)
Basophils Relative: 0 % (ref 0–1)
EOS PCT: 0 % (ref 0–5)
Eosinophils Absolute: 0 10*3/uL (ref 0.0–0.7)
HEMATOCRIT: 41.9 % (ref 36.0–46.0)
Hemoglobin: 14.2 g/dL (ref 12.0–15.0)
LYMPHS ABS: 0.7 10*3/uL (ref 0.7–4.0)
LYMPHS PCT: 5 % — AB (ref 12–46)
MCH: 28.2 pg (ref 26.0–34.0)
MCHC: 33.9 g/dL (ref 30.0–36.0)
MCV: 83.3 fL (ref 78.0–100.0)
MONO ABS: 0.2 10*3/uL (ref 0.1–1.0)
Monocytes Relative: 1 % — ABNORMAL LOW (ref 3–12)
Neutro Abs: 12.7 10*3/uL — ABNORMAL HIGH (ref 1.7–7.7)
Neutrophils Relative %: 94 % — ABNORMAL HIGH (ref 43–77)
Platelets: 353 10*3/uL (ref 150–400)
RBC: 5.03 MIL/uL (ref 3.87–5.11)
RDW: 17.4 % — ABNORMAL HIGH (ref 11.5–15.5)
WBC: 13.6 10*3/uL — ABNORMAL HIGH (ref 4.0–10.5)

## 2014-05-25 LAB — URINE MICROSCOPIC-ADD ON

## 2014-05-25 LAB — BASIC METABOLIC PANEL
BUN: 32 mg/dL — ABNORMAL HIGH (ref 6–23)
BUN: 30 mg/dL — ABNORMAL HIGH (ref 6–23)
CHLORIDE: 90 meq/L — AB (ref 96–112)
CHLORIDE: 95 meq/L — AB (ref 96–112)
CO2: 24 mEq/L (ref 19–32)
CO2: 25 mEq/L (ref 19–32)
CREATININE: 1.36 mg/dL — AB (ref 0.50–1.10)
CREATININE: 1.32 mg/dL — AB (ref 0.50–1.10)
Calcium: 9.5 mg/dL (ref 8.4–10.5)
Calcium: 9.2 mg/dL (ref 8.4–10.5)
GFR calc Af Amer: 47 mL/min — ABNORMAL LOW (ref >90)
GFR calc non Af Amer: 40 mL/min — ABNORMAL LOW (ref >90)
GFR calc non Af Amer: 42 mL/min — ABNORMAL LOW (ref >90)
GFR, EST AFRICAN AMERICAN: 48 mL/min — AB (ref >90)
Glucose, Bld: 668 mg/dL (ref 70–99)
Glucose, Bld: 425 mg/dL — ABNORMAL HIGH (ref 70–99)
POTASSIUM: 4.3 meq/L (ref 3.7–5.3)
Potassium: 4.6 mEq/L (ref 3.7–5.3)
SODIUM: 131 meq/L — AB (ref 137–147)
Sodium: 134 mEq/L — ABNORMAL LOW (ref 137–147)

## 2014-05-25 LAB — I-STAT VENOUS BLOOD GAS, ED
Acid-base deficit: 1 mmol/L (ref 0.0–2.0)
Bicarbonate: 24.1 mEq/L — ABNORMAL HIGH (ref 20.0–24.0)
O2 SAT: 83 %
TCO2: 25 mmol/L (ref 0–100)
pCO2, Ven: 41.2 mmHg — ABNORMAL LOW (ref 45.0–50.0)
pH, Ven: 7.374 — ABNORMAL HIGH (ref 7.250–7.300)
pO2, Ven: 49 mmHg — ABNORMAL HIGH (ref 30.0–45.0)

## 2014-05-25 LAB — PROTIME-INR
INR: 0.97 (ref 0.00–1.49)
Prothrombin Time: 12.7 seconds (ref 11.6–15.2)

## 2014-05-25 LAB — GLUCOSE, CAPILLARY
GLUCOSE-CAPILLARY: 229 mg/dL — AB (ref 70–99)
Glucose-Capillary: 323 mg/dL — ABNORMAL HIGH (ref 70–99)

## 2014-05-25 LAB — I-STAT TROPONIN, ED: TROPONIN I, POC: 0 ng/mL (ref 0.00–0.08)

## 2014-05-25 MED ORDER — PREDNISONE 50 MG PO TABS
50.0000 mg | ORAL_TABLET | Freq: Every day | ORAL | Status: DC
  Filled 2014-05-25 (×2): qty 1

## 2014-05-25 MED ORDER — PANTOPRAZOLE SODIUM 40 MG PO TBEC
40.0000 mg | DELAYED_RELEASE_TABLET | Freq: Every day | ORAL | Status: DC
  Administered 2014-05-26 – 2014-05-27 (×3): 40 mg via ORAL
  Filled 2014-05-25 (×3): qty 1

## 2014-05-25 MED ORDER — HEPARIN (PORCINE) IN NACL 100-0.45 UNIT/ML-% IJ SOLN
1150.0000 [IU]/h | INTRAMUSCULAR | Status: DC
  Administered 2014-05-25: 1400 [IU]/h via INTRAVENOUS
  Administered 2014-05-26 (×2): 1300 [IU]/h via INTRAVENOUS
  Filled 2014-05-25 (×3): qty 250

## 2014-05-25 MED ORDER — CITALOPRAM HYDROBROMIDE 20 MG PO TABS
20.0000 mg | ORAL_TABLET | Freq: Every day | ORAL | Status: DC
  Administered 2014-05-26 – 2014-05-27 (×3): 20 mg via ORAL
  Filled 2014-05-25 (×3): qty 1

## 2014-05-25 MED ORDER — INSULIN GLARGINE 100 UNIT/ML ~~LOC~~ SOLN
55.0000 [IU] | Freq: Every day | SUBCUTANEOUS | Status: DC
  Administered 2014-05-26 – 2014-05-27 (×3): 55 [IU] via SUBCUTANEOUS
  Filled 2014-05-25 (×3): qty 0.55

## 2014-05-25 MED ORDER — SODIUM CHLORIDE 0.9 % IV BOLUS (SEPSIS)
500.0000 mL | Freq: Once | INTRAVENOUS | Status: AC
  Administered 2014-05-25: 500 mL via INTRAVENOUS

## 2014-05-25 MED ORDER — HYDRALAZINE HCL 20 MG/ML IJ SOLN: 10.0000 mg | INTRAMUSCULAR | Status: DC | PRN

## 2014-05-25 MED ORDER — SODIUM CHLORIDE 0.9 % IV BOLUS (SEPSIS)
2000.0000 mL | Freq: Once | INTRAVENOUS | Status: AC
  Administered 2014-05-25: 2000 mL via INTRAVENOUS

## 2014-05-25 MED ORDER — SODIUM CHLORIDE 0.9 % IV SOLN
1000.0000 mL | INTRAVENOUS | Status: DC
  Administered 2014-05-25 – 2014-05-26 (×3): 1000 mL via INTRAVENOUS

## 2014-05-25 MED ORDER — CLOTRIMAZOLE 1 % EX CREA
1.0000 "application " | TOPICAL_CREAM | Freq: Two times a day (BID) | CUTANEOUS | Status: DC
  Administered 2014-05-26 – 2014-05-27 (×4): 1 via TOPICAL
  Filled 2014-05-25 (×2): qty 15

## 2014-05-25 MED ORDER — DEXTROSE-NACL 5-0.45 % IV SOLN: INTRAVENOUS | Status: DC

## 2014-05-25 MED ORDER — AMLODIPINE BESYLATE 10 MG PO TABS
10.0000 mg | ORAL_TABLET | Freq: Every day | ORAL | Status: DC
  Administered 2014-05-26 – 2014-05-27 (×3): 10 mg via ORAL
  Filled 2014-05-25 (×3): qty 1

## 2014-05-25 MED ORDER — SIMVASTATIN 20 MG PO TABS
20.0000 mg | ORAL_TABLET | Freq: Every day | ORAL | Status: DC
  Administered 2014-05-26: 20 mg via ORAL
  Filled 2014-05-25 (×2): qty 1

## 2014-05-25 MED ORDER — ACETAMINOPHEN 325 MG PO TABS: 650.0000 mg | ORAL_TABLET | Freq: Four times a day (QID) | ORAL | Status: DC | PRN

## 2014-05-25 MED ORDER — NICOTINE 14 MG/24HR TD PT24
14.0000 mg | MEDICATED_PATCH | Freq: Every day | TRANSDERMAL | Status: DC
  Administered 2014-05-26 – 2014-05-27 (×3): 14 mg via TRANSDERMAL
  Filled 2014-05-25 (×3): qty 1

## 2014-05-25 MED ORDER — SODIUM CHLORIDE 0.9 % IV SOLN
INTRAVENOUS | Status: DC
  Administered 2014-05-25: 3.8 [IU]/h via INTRAVENOUS
  Filled 2014-05-25: qty 1

## 2014-05-25 MED ORDER — INSULIN ASPART 100 UNIT/ML ~~LOC~~ SOLN
0.0000 [IU] | SUBCUTANEOUS | Status: DC
Start: 2014-05-25 — End: 2014-05-27
  Administered 2014-05-25: 23:00:00 via SUBCUTANEOUS
  Administered 2014-05-26: 8 [IU] via SUBCUTANEOUS
  Administered 2014-05-26: 5 [IU] via SUBCUTANEOUS
  Administered 2014-05-26 (×2): 8 [IU] via SUBCUTANEOUS
  Administered 2014-05-26 (×2): 2 [IU] via SUBCUTANEOUS
  Administered 2014-05-27: 8 [IU] via SUBCUTANEOUS
  Administered 2014-05-27: 3 [IU] via SUBCUTANEOUS
  Administered 2014-05-27: 11 [IU] via SUBCUTANEOUS

## 2014-05-25 MED ORDER — HEPARIN BOLUS VIA INFUSION
4000.0000 [IU] | Freq: Once | INTRAVENOUS | Status: AC
  Administered 2014-05-25: 4000 [IU] via INTRAVENOUS
  Filled 2014-05-25: qty 4000

## 2014-05-25 NOTE — ED Notes (Signed)
Attempted second IV unsuccessful

## 2014-05-25 NOTE — ED Notes (Signed)
Pt here for elevated cbg, pt sts she checked it this am and it was 300+, then took insulin and then it was 400+ then after checking it again 500+ after 2 doess of insulin. ems unable to get IV access. , pt reports feeling tired abut denies n/v or pain

## 2014-05-25 NOTE — ED Provider Notes (Signed)
CSN: [id number]     Arrival date & time 05/25/14  1449 History   First MD Initiated Contact with Patient 05/25/14 1501     Chief Complaint  Patient presents with  . Hyperglycemia     (Consider location/radiation/quality/duration/timing/severity/associated sxs/prior Treatment) Patient is a 63 y.o. female presenting with general illness. The history is provided by the patient.  Illness Severity:  Severe Onset quality:  Gradual Timing:  Constant Progression:  Worsening Chronicity:  Recurrent Associated symptoms: abdominal pain, congestion, cough (mild dry), fatigue and rhinorrhea (mild)   Associated symptoms: no chest pain, no diarrhea, no fever, no headaches, no nausea, no rash, no shortness of breath and no vomiting     63 yo F pw hyperglycemia. H/o diabetes. Worsening glucose past 3 days. Today initially 300. Increased after breakfast despite insulin. States that it is greater than 500 at home. Increased uop. No dysuria or hematuria. No fevers. Reports medciation compliance. Started on prednisone for "eye problem" in April. Still on it. Uncertain what her diagnosis was. States that her eye "does what it wants to". Improved after being on prednisone.  Denies any cp, sob, headache, diarrhea, n/v.  Does endorse generalized fatigue. Mild suprapubic discomfort.  Chronic back pain unchanged.   Review of records. Recent negative biopsy for temporal arteritis, but started on steroids anyways. PCP note indicates non-compliance history with insulin, coumadin, and prednisone.   Past Medical History  Diagnosis Date  . Diabetes mellitus     diagnosed in 2000.    Marland Kitchen Hypertension   . Pulmonary embolism     2011 treated at [facility] in [place].  Was on Coumadin for over a year.  no known family history  . UTI (urinary tract infection)   . High cholesterol   . 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 Depression   . Neuropathy     feet  . Pneumonia   . Kidney stones   . Headache(784.0)      migraines ( 12 years ago)   Past Surgical History  Procedure Laterality Date  . Abdominal hysterectomy    . Cholecystectomy      1980  . Esophagogastroduodenoscopy  08/19/2012    Procedure: ESOPHAGOGASTRODUODENOSCOPY (EGD);  Surgeon: Winfield Cunas., MD;  Location: [facility] ENDOSCOPY;  Service: Endoscopy;  Laterality: N/A;  . Cesarean section    . Carpal tunnel release Bilateral   . Tonsillectomy    . Breast surgery Left     breast biopsy  . Eye surgery Bilateral     lasik  . Artery biopsy Right 05/09/2014    Procedure: BIOPSY TEMPORAL ARTERY;  Surgeon: Rosetta Posner, MD;  Location: [facility] OR;  Service: Vascular;  Laterality: Right;   Family History  Problem Relation Age of Onset  . Adopted: Yes  . Hypertension Mother    History  Substance Use Topics  . Smoking status: Current Every Day Smoker -- 0.05 packs/day for 10 years    Types: Cigarettes  . Smokeless tobacco: Never Used     Comment: Wants patches.  . Alcohol Use: No   OB History   Grav Para Term Preterm Abortions TAB SAB Ect Mult Living                 Review of Systems  Constitutional: Positive for fatigue. Negative for fever and chills.  HENT: Positive for congestion and rhinorrhea (mild).   Eyes: Negative for pain and visual disturbance.  Respiratory: Positive for cough (mild dry).  Negative for shortness of breath.   Cardiovascular: Negative for chest pain and leg swelling.  Gastrointestinal: Positive for abdominal pain. Negative for nausea, vomiting and diarrhea.  Genitourinary: Negative for dysuria, hematuria, flank pain and difficulty urinating.  Musculoskeletal: Positive for back pain (chronic).  Skin: Negative for color change and rash.  Neurological: Negative for dizziness and headaches.  All other systems reviewed and are negative.     Allergies  Keflex; Latex; Sulfa antibiotics; and Sulfur  Home Medications   Prior to Admission medications   Medication Sig Start Date End Date Taking?  Authorizing Provider  acetaminophen (TYLENOL) 500 MG tablet Take 1,000 mg by mouth every 6 (six) hours as needed for moderate pain.   Yes Historical Provider, MD  amLODipine (NORVASC) 10 MG tablet Take 1 tablet (10 mg total) by mouth daily.   Yes Jessee Avers, MD  citalopram (CELEXA) 20 MG tablet Take 20 mg by mouth daily.   Yes Historical Provider, MD  clotrimazole (LOTRIMIN) 1 % cream Apply 1 application topically 2 (two) times daily. 05/16/14  Yes Jessee Avers, MD  ibuprofen (ADVIL,MOTRIN) 800 MG tablet Take 800 mg by mouth every 8 (eight) hours as needed for moderate pain.   Yes Historical Provider, MD  insulin NPH-regular Human (NOVOLIN 70/30) (70-30) 100 UNIT/ML injection Inject 40 Units into the skin 2 (two) times daily with a meal. 05/16/14  Yes Jessee Avers, MD  loperamide (IMODIUM) 2 MG capsule Take 1 capsule (2 mg total) by mouth 2 (two) times daily as needed for diarrhea or loose stools. 05/21/14  Yes Jessee Avers, MD  losartan-hydrochlorothiazide (HYZAAR) 100-25 MG per tablet Take 1 tablet by mouth daily.   Yes Historical Provider, MD  metFORMIN (GLUCOPHAGE) 500 MG tablet Take 1 tablet (500 mg total) by mouth 2 (two) times daily with a meal. 05/16/14  Yes Jessee Avers, MD  omeprazole (PRILOSEC) 20 MG capsule Take 1 capsule (20 mg total) by mouth daily. 05/23/14  Yes Jessee Avers, MD  pravastatin (PRAVACHOL) 40 MG tablet Take 40 mg by mouth every evening.    Yes Historical Provider, MD  predniSONE (DELTASONE) 50 MG tablet Take 50 mg by mouth daily with breakfast.   Yes Historical Provider, MD  warfarin (COUMADIN) 5 MG tablet Take 2.5-3 tablets (12.5-15 mg total) by mouth See admin instructions. Patient takes 12.5 mg on all days except on Monday and Thursday patient takes 67m  (patient uses 567mstrength tablets) 03/27/14  Yes RiJessee AversMD  Blood Glucose Monitoring Suppl (ACCU-CHEK AVIVA PLUS) W/DEVICE KIT Please check blood sugar 3 to 4 times daily. diag code 250.0. Insulin  dependent    RiJessee AversMD  Blood Pressure KIT 1 Units by Does not apply route daily. 03/01/13   RiJessee AversMD  [facility] SYRINGE 31G X 5/16" 1 ML MISC USE AS DIRECTED TWICE DAILY 03/27/14   RiJessee AversMD  glucose blood (ACCU-CHEK AVIVA) test strip Please check blood sugar 3 to 4 times daily. diag code 250.0. Insulin dependent    RiJessee AversMD  Lancet Devices ([facility]ancets Use to check blood sugars 3 to 4 times daily. Dx code: 250.00. Insulin dependent.    RiJessee AversMD   BP 141/84  Pulse 96  Temp(Src) 98.6 F (37 C) (Oral)  Resp 18  Ht _0  (1.753 m)  Wt 240 lb (108.863 kg)  BMI 35.43 kg/m2  SpO2 100% Physical Exam  Nursing note and vitals reviewed. Constitutional: She is oriented to person, place, and  time. She appears well-developed and well-nourished. No distress.  HENT:  Head: Normocephalic and atraumatic.  Eyes: Conjunctivae are normal. Right eye exhibits no discharge. Left eye exhibits no discharge.  Neck: No tracheal deviation present.  Cardiovascular: Normal rate, regular rhythm, normal heart sounds and intact distal pulses.   Pulmonary/Chest: Effort normal and breath sounds normal. No stridor. No respiratory distress. She has no wheezes. She has no rales.  Abdominal: Soft. She exhibits no distension. There is tenderness (minimal suprapubic. No RLQ or LLQ. non-peritonitic. ). There is no guarding.  Musculoskeletal: She exhibits no edema and no tenderness.  Neurological: She is alert and oriented to person, place, and time.  Skin: Skin is warm and dry.    ED Course  Procedures (including critical care time) Labs Review Labs Reviewed  BASIC METABOLIC PANEL - Abnormal; Notable for the following:    Sodium 131 (*)    Chloride 90 (*)    Glucose, Bld 668 (*)    BUN 32 (*)    Creatinine, Ser 1.36 (*)    GFR calc non Af Amer 40 (*)    GFR calc Af Amer 47 (*)    All other components within normal limits  CBC WITH  DIFFERENTIAL - Abnormal; Notable for the following:    WBC 13.6 (*)    RDW 17.4 (*)    Neutrophils Relative % 94 (*)    Neutro Abs 12.7 (*)    Lymphocytes Relative 5 (*)    Monocytes Relative 1 (*)    All other components within normal limits  URINALYSIS, ROUTINE W REFLEX MICROSCOPIC - Abnormal; Notable for the following:    Glucose, UA >1000 (*)    Hgb urine dipstick TRACE (*)    Protein, ur 100 (*)    Leukocytes, UA SMALL (*)    All other components within normal limits  URINE MICROSCOPIC-ADD ON - Abnormal; Notable for the following:    Squamous Epithelial / LPF MANY (*)    All other components within normal limits  BASIC METABOLIC PANEL - Abnormal; Notable for the following:    Sodium 134 (*)    Chloride 95 (*)    Glucose, Bld 425 (*)    BUN 30 (*)    Creatinine, Ser 1.32 (*)    GFR calc non Af Amer 42 (*)    GFR calc Af Amer 48 (*)    All other components within normal limits  HEPATIC FUNCTION PANEL - Abnormal; Notable for the following:    Albumin 2.5 (*)    Alkaline Phosphatase 121 (*)    Total Bilirubin <0.2 (*)    All other components within normal limits  BASIC METABOLIC PANEL - Abnormal; Notable for the following:    Glucose, Bld 275 (*)    BUN 27 (*)    Creatinine, Ser 1.13 (*)    GFR calc non Af Amer 50 (*)    GFR calc Af Amer 58 (*)    All other components within normal limits  GLUCOSE, CAPILLARY - Abnormal; Notable for the following:    Glucose-Capillary 323 (*)    All other components within normal limits  GLUCOSE, CAPILLARY - Abnormal; Notable for the following:    Glucose-Capillary 229 (*)    All other components within normal limits  GLUCOSE, CAPILLARY - Abnormal; Notable for the following:    Glucose-Capillary 278 (*)    All other components within normal limits  CBG MONITORING, ED - Abnormal; Notable for the following:    Glucose-Capillary >600 (*)  All other components within normal limits  I-STAT VENOUS BLOOD GAS, ED - Abnormal; Notable for  the following:    pH, Ven 7.374 (*)    pCO2, Ven 41.2 (*)    pO2, Ven 49.0 (*)    Bicarbonate 24.1 (*)    All other components within normal limits  I-STAT CHEM 8, ED - Abnormal; Notable for the following:    Sodium 132 (*)    BUN 29 (*)    Creatinine, Ser 1.50 (*)    Glucose, Bld 526 (*)    Hemoglobin 16.3 (*)    HCT 48.0 (*)    All other components within normal limits  CBG MONITORING, ED - Abnormal; Notable for the following:    Glucose-Capillary 439 (*)    All other components within normal limits  I-STAT CHEM 8, ED - Abnormal; Notable for the following:    BUN 29 (*)    Creatinine, Ser 1.50 (*)    Glucose, Bld 407 (*)    Hemoglobin 16.0 (*)    HCT 47.0 (*)    All other components within normal limits  CBG MONITORING, ED - Abnormal; Notable for the following:    Glucose-Capillary 396 (*)    All other components within normal limits  MRSA PCR SCREENING  CULTURE, BLOOD (ROUTINE X 2)  CULTURE, BLOOD (ROUTINE X 2)  URINE CULTURE  PROTIME-INR  CREATININE, URINE, RANDOM  TROPONIN I  CBC  UREA NITROGEN, URINE  PROTIME-INR  HEPARIN LEVEL (UNFRACTIONATED)  BASIC METABOLIC PANEL  I-STAT TROPOININ, ED    Imaging Review Dg Chest 2 View  05/25/2014   CLINICAL DATA:  Hyperglycemia.  EXAM: CHEST  2 VIEW  COMPARISON:  05/09/14.  FINDINGS: Low lung volumes accentuate the cardiomediastinal silhouette which is probably stable. Mild vascular congestion without overt infiltrates or failure. No pneumothorax.  IMPRESSION: Low lung volumes and mild vascular congestion without overt infiltrates or failure.  Slight worsening aeration from priors.   Electronically Signed   By: Rolla Flatten M.D.   On: 05/25/2014 15:53     EKG Interpretation   Date/Time:  Friday May 25 2014 16:09:48 EDT Ventricular Rate:  101 PR Interval:  158 QRS Duration: 85 QT Interval:  353 QTC Calculation: 457 R Axis:   136 Text Interpretation:  Sinus tachycardia Right axis deviation Consider  anterior infarct  Confirmed by Kathrynn Humble, MD, Thelma Comp [phone]) on 05/25/2014  4:21:46 PM      MDM   Final diagnoses:  DKA (diabetic ketoacidoses)    63 yo F with hyperglycemia. Polyuria with mild suprapubic discomfort. Generalized fatigue. Otherwise without complaint. HDS, af. NAD. Reassuring exam.   1:04 AM Patient feeling better after some IVF. Still fatigued. Awaiting BMP. Lungs CTAB. No rales. Denies any h/o CHF. Ordered another 500cc NS bolus.  BMP with AG. Concern for DKA. On insulin ggt. Admit to step down unit.   Without infectious findings on history or work up.   Discussed case with Dr. Kathrynn Humble who is in agreement with assessment and plan.    Bonnita Hollow, MD 05/26/14 [phone]

## 2014-05-25 NOTE — Progress Notes (Signed)
ANTICOAGULATION CONSULT NOTE - Initial Consult  Pharmacy Consult for Heparin  Indication: h/o pulmonary embolus  Allergies  Allergen Reactions  . Keflex [Cephalexin] Itching and Swelling    Lips and eyes swelled up  . Latex Itching  . Sulfa Antibiotics Rash  . Sulfur Rash    Patient Measurements: Height: 5\' 9"  (175.3 cm) Weight: 240 lb (108.863 kg) IBW/kg (Calculated) : 66.2 Heparin Dosing Weight: 91 kg   Vital Signs: Temp: 98.6 F (37 C) (06/05 1457) Temp src: Oral (06/05 1457) BP: 144/84 mmHg (06/05 1830) Pulse Rate: 96 (06/05 1830)  Labs:  Recent Labs  05/25/14 1554 05/25/14 1833 05/25/14 2021  HGB 14.2 16.3* 16.0*  HCT 41.9 48.0* 47.0*  PLT 353  --   --   LABPROT 12.7  --   --   INR 0.97  --   --   CREATININE 1.36* 1.50* 1.50*    Estimated Creatinine Clearance: 49.8 ml/min (by C-G formula based on Cr of 1.5).   Medical History: Past Medical History  Diagnosis Date  . Diabetes mellitus     diagnosed in 2000.    Marland Kitchen Hypertension   . Pulmonary embolism     2011 treated at [facility] in [place].  Was on Coumadin for over a year.  no known family history  . UTI (urinary tract infection)   . High cholesterol   . 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 Depression   . Neuropathy     feet  . Pneumonia   . Kidney stones   . Headache(784.0)     migraines ( 12 years ago)    Medications:   (Not in a hospital admission)  Assessment: 69 YOF with hyperglycemia to start on heparin for h/o of PE. Patient is on chronic anticoagulation on coumadin but admits to not being compliant with taking her medication daily. INR today is sub-therapeutic at 0.97. H/H 16/47. Plt wnl.   Goal of Therapy:  Heparin level 0.3-0.7 units/ml Monitor platelets by anticoagulation protocol: Yes   Plan:  Give 4000 units bolus x 1 Start heparin infusion at 1400 units/hr Check anti-Xa level in 6 hours and daily while on heparin Continue to monitor H&H and  platelets  Albertina Parr, PharmD.  Clinical Pharmacist Pager [phone]

## 2014-05-25 NOTE — Progress Notes (Signed)
Pt's start of care for [facility] RN 05/22/2014.

## 2014-05-25 NOTE — ED Notes (Signed)
Internal MD at bedside.

## 2014-05-25 NOTE — ED Notes (Signed)
Marya Amsler, RN notified of abnormal lab test results

## 2014-05-25 NOTE — H&P (Signed)
Date: 05/25/2014               Patient Name:  Joy Butler MRN: [id number]  DOB: 05-May-1951 Age / Sex: 63 y.o., female   PCP: Jessee Avers, MD         Medical Service: Internal Medicine Teaching Service         Attending Physician: Dr. Karren Cobble, MD    First Contact: Dr. Bing Neighbors, MD Pager: [phone]  Second Contact: Dr. Randell Loop, MD Pager: [phone]       After Hours (After 5p/  First Contact Pager: [phone]  weekends / holidays): Second Contact Pager: [phone]   Chief Complaint: Hyperglycemia  History of Present Illness: Joy Butler is a 63 y.o. 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 The patient noticed that her blood sugar was rising since starting steroids for TA (started on 4/16.15, but patient states she has only been taking it for a couple of weeks). Over the last 3-4 days her blood sugars have been much higher. Over the last two days, the blood sugar have been too high to read on her home glucometer. She has associated symptoms of malaise, polyuria and polydipsia. She reports compliance with her home insulin regimen of 40 U of 70/30 BID. Further, she reports compliance with the recent addition of metformin that was added on 05/16/14. She denies abdominal pain, fevers, chills, nausea, vomiting. She does admit to anorexia and decreased PO intake over the last few days.  On ROS, she notes pain and swelling of her left leg. She complained of this at her recent visit with Dr. Alice Rieger who ordered a LE doppler that was not completed. The patient is on lifelong anticoagulation with coumadin, but has been subtherapeutic for the last 11 months. She admits to missing doses of her coumadin.  She denies chest pain, SOB, cough and hemoptysis.   The patient also has a recent history of diarrhea for 10 days that resolved with  starting imodium no 5/27.  Meds: Current Facility-Administered Medications  Medication Dose Route Frequency Provider Last Rate Last Dose  . 0.9 %  sodium chloride infusion  1,000 mL Intravenous Continuous Bonnita Hollow, MD 150 mL/hr at 05/25/14 1846 1,000 mL at 05/25/14 1846  . dextrose 5 %-0.45 % sodium chloride infusion   Intravenous Continuous Bonnita Hollow, MD      . insulin regular (NOVOLIN R,HUMULIN R) 1 Units/mL in sodium chloride 0.9 % 100 mL infusion   Intravenous Continuous Bonnita Hollow, MD 3.8 mL/hr at 05/25/14 1847 3.8 Units/hr at 05/25/14 1847   Current Outpatient Prescriptions  Medication Sig Dispense Refill  . acetaminophen (TYLENOL) 500 MG tablet Take 1,000 mg by mouth every 6 (six) hours as needed for moderate pain.      Marland Kitchen amLODipine (NORVASC) 10 MG tablet Take 1 tablet (10 mg total) by mouth daily.  90 tablet  3  . citalopram (CELEXA) 20 MG tablet Take 20 mg by mouth daily.      . clotrimazole (LOTRIMIN) 1 % cream Apply 1 application topically 2 (two) times daily.  30 g  0  . ibuprofen (ADVIL,MOTRIN) 800 MG tablet Take 800 mg by mouth every 8 (eight) hours as needed for moderate pain.      Marland Kitchen insulin NPH-regular Human (NOVOLIN 70/30) (70-30) 100 UNIT/ML injection Inject 40 Units into the skin 2 (two)  times daily with a meal.  20 mL  11  . loperamide (IMODIUM) 2 MG capsule Take 1 capsule (2 mg total) by mouth 2 (two) times daily as needed for diarrhea or loose stools.  30 capsule  0  . losartan-hydrochlorothiazide (HYZAAR) 100-25 MG per tablet Take 1 tablet by mouth daily.      . metFORMIN (GLUCOPHAGE) 500 MG tablet Take 1 tablet (500 mg total) by mouth 2 (two) times daily with a meal.  30 tablet  1  . omeprazole (PRILOSEC) 20 MG capsule Take 1 capsule (20 mg total) by mouth daily.  90 capsule  3  . pravastatin (PRAVACHOL) 40 MG tablet Take 40 mg by mouth every evening.       . predniSONE (DELTASONE) 50 MG tablet Take 50 mg by mouth daily with breakfast.      . warfarin  (COUMADIN) 5 MG tablet Take 2.5-3 tablets (12.5-15 mg total) by mouth See admin instructions. Patient takes 12.5 mg on all days except on Monday and Thursday patient takes 63m  (patient uses 569mstrength tablets)  90 tablet  3  . Blood Glucose Monitoring Suppl (ACCU-CHEK AVIVA PLUS) W/DEVICE KIT Please check blood sugar 3 to 4 times daily. diag code 250.0. Insulin dependent  1 kit  0  . Blood Pressure KIT 1 Units by Does not apply route daily.  1 each  0  . EASY TOUCH INSULIN SYRINGE 31G X 5/16" 1 ML MISC USE AS DIRECTED TWICE DAILY  100 each  6  . glucose blood (ACCU-CHEK AVIVA) test strip Please check blood sugar 3 to 4 times daily. diag code 250.0. Insulin dependent  100 each  12  . Lancet Devices (ACCU-CHEK SOFTCLIX) lancets Use to check blood sugars 3 to 4 times daily. Dx code: 250.00. Insulin dependent.  100 each  0    Allergies: Allergies as of 05/25/2014 - Review Complete 05/25/2014  Allergen Reaction Noted  . Keflex [cephalexin] Itching and Swelling 08/19/2012  . Latex Itching 09/01/2012  . Sulfa antibiotics Rash 08/16/2012  . Sulfur Rash 08/16/2012   Past Medical History  Diagnosis Date  . Diabetes mellitus     diagnosed in 2000.    . Marland Kitchenypertension   . Pulmonary embolism     [facility] Was on Coumadin for over a year.  no known family history  . UTI (urinary tract infection)   . High cholesterol   . 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 Depression   . Neuropathy     feet  . Pneumonia   . Kidney stones   . Headache(784.0)     migraines ( 12 years ago)   Past Surgical History  Procedure Laterality Date  . Abdominal hysterectomy    . Cholecystectomy      1980  . Esophagogastroduodenoscopy  08/19/2012    Procedure: ESOPHAGOGASTRODUODENOSCOPY (EGD);  Surgeon: JaWinfield Cunas MD;  [facility];  Service: Endoscopy;  Laterality: N/A;  . Cesarean section    . Carpal tunnel release Bilateral   . Tonsillectomy    .  Breast surgery Left     breast biopsy  . Eye surgery Bilateral     lasik  . Artery biopsy Right 05/09/2014    Procedure: BIOPSY TEMPORAL ARTERY;  Surgeon: ToRosetta PosnerMD;  [facility];  Service: Vascular;  Laterality: Right;   Family History  Problem Relation Age of Onset  . Adopted: Yes  .  Hypertension Mother    History   Social History  . Marital Status: Single    Spouse Name: N/A    Number of Children: N/A  . Years of Education: N/A   Occupational History  . Not on file.   Social History Main Topics  . Smoking status: Current Every Day Smoker -- 0.05 packs/day for 10 years    Types: Cigarettes  . Smokeless tobacco: Never Used     Comment: Wants patches.  . Alcohol Use: No  . Drug Use: No  . Sexual Activity: Not Currently   Other Topics Concern  . Not on file   Social History Narrative   Previously divorced now engaged with her partner of 4 years.  Has 6 grown children with 21 grandchildren.  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  Moved to [place] in August 2013 and was transiently homeless until first part of October 2013.      Review of Systems: Pertinent items are noted in HPI.  Physical Exam: Blood pressure 144/84, pulse 96, temperature 98.6 F (37 C), temperature source Oral, resp. rate 15, height _0  (1.753 m), weight 240 lb (108.863 kg), SpO2 96.00%. Physical Exam  Constitutional: She is oriented to person, place, and time. She appears well-developed and well-nourished. No distress.  Obese lethargic female.  HENT:  Dry mucous membranes.   Cardiovascular: Normal rate, regular rhythm, normal heart sounds and intact distal pulses.  Exam reveals no friction rub.   No murmur heard. Pulmonary/Chest: Effort normal and breath sounds normal. No respiratory distress. She has no wheezes. She has no rales. She exhibits no tenderness.  Abdominal: Soft. Bowel sounds are normal. She exhibits no distension. There is no  tenderness. There is no rebound and no guarding.  Musculoskeletal: She exhibits edema and tenderness.  Trace edema on left lower leg. Left calf tender to palpation.  Neurological: She is alert and oriented to person, place, and time.  Skin: She is not diaphoretic.  Psychiatric: She has a normal mood and affect. Her behavior is normal.  Patient appears tired.      Lab results: Basic Metabolic Panel:  Recent Labs  05/25/14 1554 05/25/14 1833  NA 131* 132*  K 4.6 4.1  CL 90* 101  CO2 24  --   GLUCOSE 668* 526*  BUN 32* 29*  CREATININE 1.36* 1.50*  CALCIUM 9.5  --    CBC:  Recent Labs  05/25/14 1554 05/25/14 1833  WBC 13.6*  --   NEUTROABS 12.7*  --   HGB 14.2 16.3*  HCT 41.9 48.0*  MCV 83.3  --   PLT 353  --    CBG:  Recent Labs  05/25/14 1601 05/25/14 1838  GLUCAP >600* 439*   Coagulation:  Recent Labs  05/25/14 1554  LABPROT 12.7  INR 0.97   Urine Drug Screen: Drugs of Abuse     Component Value Date/Time   LABOPIA POSITIVE* 10/12/2012 0803   COCAINSCRNUR NONE DETECTED 10/12/2012 0803   LABBENZ NONE DETECTED 10/12/2012 0803   AMPHETMU NONE DETECTED 10/12/2012 0803   THCU NONE DETECTED 10/12/2012 0803   LABBARB NONE DETECTED 10/12/2012 0803    Urinalysis:  Recent Labs  05/25/14 1609  COLORURINE YELLOW  LABSPEC 1.028  PHURINE 5.5  GLUCOSEU >1000*  HGBUR TRACE*  BILIRUBINUR NEGATIVE  KETONESUR NEGATIVE  PROTEINUR 100*  UROBILINOGEN 0.2  NITRITE NEGATIVE  LEUKOCYTESUR SMALL*   Imaging results:  Dg Chest 2 View  05/25/2014  CLINICAL DATA:  Hyperglycemia.  EXAM: CHEST  2 VIEW  COMPARISON:  05/09/14.  FINDINGS: Low lung volumes accentuate the cardiomediastinal silhouette which is probably stable. Mild vascular congestion without overt infiltrates or failure. No pneumothorax.  IMPRESSION: Low lung volumes and mild vascular congestion without overt infiltrates or failure.  Slight worsening aeration from priors.   Electronically Signed   By:  Rolla Flatten M.D.   On: 05/25/2014 15:53    Other results: EKG: More prominent s wave in I and increased R wave in aVR suggestive of right heart strain. Mild right axis shift.   Assessment & Plan by Problem: Principal Problem:   Uncontrolled type 2 DM with hyperosmolar nonketotic hyperglycemia Active Problems:   Long term (current) use of anticoagulants   DKA (diabetic ketoacidoses)  HHS: The patient symptoms are consistent with hyperglycemic hyperosmolar state. The patient presents with severe hyperglycemia. Though patient has anion gap of 17, she does not have ketones in urine, making typical DKA less likely. Further, VBG had pH of 7.37. It is still possible that the patient has on the verge of developing DKA. Her elevated anion gap is likely due to AKI. The etiology is likely multifactorial, inclduing possible insulin medication noncompliance and high dose of steroid use for temporal arteritis. Patient received 1 L of normal saline bolus in ED. She was started with insulin drip in ED. Patient is dehydrated on physical examination.  -will admit to step down unit  - give 2 more liters of NS and followd by NS 150cc/h  - BMP q2h  - d/c insulin gtt and start lantus 55 U and SSI  - blood culture X 2  - trop x 1   Temporal arteritis: Patient had biopsy originally scheduled for 4/17, but she was unable to make the appointment with ophthalmology. She was able to have the biopsy completed about 2 weeks ago which was negative per patient report. This does not completely rule out the diagnosis of temporal arteritis. Typically a second biopsy could be taken to rule out TA. Currently patient still has a headache, but she reports it has improved with steroid therapy. She does not have acute vision changes today. The patient was instructed to continue steroid, prednisone 50 mg daily.  -will continue home prednisone at this time.  History of PE: She had history of recurrent pulmonary embolism on 2011 and  2013. Patient has history of positive lupus antibody and elevated PTT in the past. She is Coumadin at home, but INR is subtherapeutic on admission. Of note, she has been subtherapeutic for 11 months. Patient has left lower leg swelling and pain. Lower extremity Doppler was ordered recently, but patient has not completed this yet. -will start Heparin gtt, due to signs of right heart strain on EKG -continue coumadin per pharmacy, may consider switching to alternate agent given poor compliance with coumadin therapy -L LE doppler  HTN: She is hypertensive today. She reports that she has been compliant to her medications.  - will d/c Hyzaar due to AKI  - continue amlodipine 10 mg daily.  - hydralazine IV prn for SBP>190   AKI: creatinine is elevated from 1.09 on 02/21/14 to 1.36(1.5 on istat) on admission. It is most likely due to dehydration and prerenal failure.  - IVF as above  - check FeUrea   Leukocytosis: She has a mild leukocytosis. No obvious infectious source identified. It is likely due to HHS or mild DKA. Urinalysis positive for small blood and small leuks, no nitrite.  Thus, UTI unlikely, but possible. Her symptoms of frequency is likely due to hyperglycemia. - blood culture X 2 and a urine culture   HLD: last LDL was 133 on 10/12/12. On pravastatin at home  - will switch to simvastatin in hospital.   VTE: on Heparin gtt  Diet: NPO  Dispo: Disposition is deferred at this time, awaiting improvement of current medical problems. Anticipated discharge in approximately 2-3 day(s).   The patient does have a current PCP Jessee Avers, MD) and does need an [facility] hospital follow-up appointment after discharge.  The patient does not have transportation limitations that hinder transportation to clinic appointments.  Signed: Marrion Coy, MD 05/25/2014, 7:13 PM

## 2014-05-25 NOTE — ED Notes (Signed)
Phlebotomy at bedside.

## 2014-05-26 ENCOUNTER — Encounter (HOSPITAL_COMMUNITY): Payer: Self-pay | Admitting: *Deleted

## 2014-05-26 DIAGNOSIS — M316 Other giant cell arteritis: Secondary | ICD-10-CM

## 2014-05-26 DIAGNOSIS — N179 Acute kidney failure, unspecified: Secondary | ICD-10-CM | POA: Diagnosis not present

## 2014-05-26 DIAGNOSIS — E1165 Type 2 diabetes mellitus with hyperglycemia: Secondary | ICD-10-CM

## 2014-05-26 DIAGNOSIS — M79609 Pain in unspecified limb: Secondary | ICD-10-CM

## 2014-05-26 DIAGNOSIS — Z86711 Personal history of pulmonary embolism: Secondary | ICD-10-CM

## 2014-05-26 DIAGNOSIS — IMO0001 Reserved for inherently not codable concepts without codable children: Secondary | ICD-10-CM

## 2014-05-26 DIAGNOSIS — I10 Essential (primary) hypertension: Secondary | ICD-10-CM | POA: Diagnosis not present

## 2014-05-26 DIAGNOSIS — E785 Hyperlipidemia, unspecified: Secondary | ICD-10-CM

## 2014-05-26 DIAGNOSIS — M7989 Other specified soft tissue disorders: Secondary | ICD-10-CM

## 2014-05-26 DIAGNOSIS — D72829 Elevated white blood cell count, unspecified: Secondary | ICD-10-CM

## 2014-05-26 LAB — CBC WITH DIFFERENTIAL/PLATELET
BASOS ABS: 0 10*3/uL (ref 0.0–0.1)
BASOS PCT: 0 % (ref 0–1)
EOS ABS: 0.1 10*3/uL (ref 0.0–0.7)
Eosinophils Relative: 0 % (ref 0–5)
HCT: 39.9 % (ref 36.0–46.0)
Hemoglobin: 13.1 g/dL (ref 12.0–15.0)
Lymphocytes Relative: 14 % (ref 12–46)
Lymphs Abs: 2.4 10*3/uL (ref 0.7–4.0)
MCH: 27.6 pg (ref 26.0–34.0)
MCHC: 32.8 g/dL (ref 30.0–36.0)
MCV: 84 fL (ref 78.0–100.0)
Monocytes Absolute: 1.4 10*3/uL — ABNORMAL HIGH (ref 0.1–1.0)
Monocytes Relative: 8 % (ref 3–12)
NEUTROS ABS: 13.9 10*3/uL — AB (ref 1.7–7.7)
NEUTROS PCT: 78 % — AB (ref 43–77)
PLATELETS: 365 10*3/uL (ref 150–400)
RBC: 4.75 MIL/uL (ref 3.87–5.11)
RDW: 17.4 % — AB (ref 11.5–15.5)
WBC: 17.8 10*3/uL — ABNORMAL HIGH (ref 4.0–10.5)

## 2014-05-26 LAB — PROTIME-INR
INR: 1.07 (ref 0.00–1.49)
PROTHROMBIN TIME: 13.7 s (ref 11.6–15.2)

## 2014-05-26 LAB — HEPARIN LEVEL (UNFRACTIONATED)
HEPARIN UNFRACTIONATED: 0.71 [IU]/mL — AB (ref 0.30–0.70)
HEPARIN UNFRACTIONATED: 0.32 [IU]/mL (ref 0.30–0.70)
Heparin Unfractionated: 0.77 IU/mL — ABNORMAL HIGH (ref 0.30–0.70)

## 2014-05-26 LAB — MRSA PCR SCREENING: MRSA by PCR: NEGATIVE

## 2014-05-26 LAB — BASIC METABOLIC PANEL
BUN: 27 mg/dL — ABNORMAL HIGH (ref 6–23)
BUN: 23 mg/dL (ref 6–23)
CALCIUM: 8.6 mg/dL (ref 8.4–10.5)
CALCIUM: 8.5 mg/dL (ref 8.4–10.5)
CO2: 22 mEq/L (ref 19–32)
CO2: 24 mEq/L (ref 19–32)
Chloride: 100 mEq/L (ref 96–112)
Chloride: 103 mEq/L (ref 96–112)
Creatinine, Ser: 1.13 mg/dL — ABNORMAL HIGH (ref 0.50–1.10)
Creatinine, Ser: 1.08 mg/dL (ref 0.50–1.10)
GFR calc Af Amer: 58 mL/min — ABNORMAL LOW (ref >90)
GFR calc non Af Amer: 50 mL/min — ABNORMAL LOW (ref >90)
GFR, EST AFRICAN AMERICAN: 62 mL/min — AB (ref >90)
GFR, EST NON AFRICAN AMERICAN: 53 mL/min — AB (ref >90)
GLUCOSE: 145 mg/dL — AB (ref 70–99)
Glucose, Bld: 275 mg/dL — ABNORMAL HIGH (ref 70–99)
POTASSIUM: 4 meq/L (ref 3.7–5.3)
Potassium: 4 mEq/L (ref 3.7–5.3)
Sodium: 137 mEq/L (ref 137–147)
Sodium: 139 mEq/L (ref 137–147)

## 2014-05-26 LAB — GLUCOSE, CAPILLARY
GLUCOSE-CAPILLARY: 144 mg/dL — AB (ref 70–99)
GLUCOSE-CAPILLARY: 278 mg/dL — AB (ref 70–99)
GLUCOSE-CAPILLARY: 299 mg/dL — AB (ref 70–99)
Glucose-Capillary: 242 mg/dL — ABNORMAL HIGH (ref 70–99)
Glucose-Capillary: 229 mg/dL — ABNORMAL HIGH (ref 70–99)
Glucose-Capillary: 145 mg/dL — ABNORMAL HIGH (ref 70–99)
Glucose-Capillary: 120 mg/dL — ABNORMAL HIGH (ref 70–99)
Glucose-Capillary: 264 mg/dL — ABNORMAL HIGH (ref 70–99)

## 2014-05-26 LAB — HEPATIC FUNCTION PANEL
ALT: 12 U/L (ref 0–35)
AST: 15 U/L (ref 0–37)
Albumin: 2.5 g/dL — ABNORMAL LOW (ref 3.5–5.2)
Alkaline Phosphatase: 121 U/L — ABNORMAL HIGH (ref 39–117)
Total Bilirubin: <0.2 mg/dL — ABNORMAL LOW (ref 0.3–1.2)
Total Protein: 6.5 g/dL (ref 6.0–8.3)

## 2014-05-26 LAB — UREA NITROGEN, URINE: UREA NITROGEN UR: 477 mg/dL

## 2014-05-26 LAB — CREATININE, URINE, RANDOM: Creatinine, Urine: 42.97 mg/dL

## 2014-05-26 LAB — TROPONIN I: Troponin I: <0.3 ng/mL (ref <0.30)

## 2014-05-26 MED ORDER — PREDNISONE 5 MG PO TABS
45.0000 mg | ORAL_TABLET | Freq: Every day | ORAL | Status: DC
  Administered 2014-05-26 – 2014-05-27 (×2): 45 mg via ORAL
  Filled 2014-05-26 (×4): qty 1

## 2014-05-26 MED ORDER — SODIUM CHLORIDE 0.9 % IV SOLN: 1000.0000 mL | INTRAVENOUS | Status: AC

## 2014-05-26 MED ORDER — SODIUM CHLORIDE 0.9 % IV SOLN: 1000.0000 mL | INTRAVENOUS | Status: DC

## 2014-05-26 MED ORDER — WARFARIN SODIUM 2.5 MG PO TABS
12.5000 mg | ORAL_TABLET | Freq: Once | ORAL | Status: AC
  Administered 2014-05-26: 12.5 mg via ORAL
  Filled 2014-05-26: qty 1

## 2014-05-26 MED ORDER — WARFARIN - PHARMACIST DOSING INPATIENT
Freq: Every day | Status: DC
  Administered 2014-05-26: 17:00:00

## 2014-05-26 NOTE — ED Provider Notes (Signed)
  CRITICAL CARE Performed by: Varney Biles, MD, and resident physician   Total critical care time: 40 min  Critical care time was exclusive of separately billable procedures and treating other patients.  Critical care was necessary to treat or prevent imminent or life-threatening deterioration.  Critical care was time spent personally by me on the following activities: development of treatment plan with patient and/or surrogate as well as nursing, discussions with consultants, evaluation of patient's response to treatment, examination of patient, obtaining history from patient or surrogate, ordering and performing treatments and interventions, ordering and review of laboratory studies, ordering and review of radiographic studies, pulse oximetry and re-evaluation of patient's condition.  Pt comes in with elevated blood sugar and weakness. Exam is non focal. There are some med changes made recently. Pt takes insulin and oral agents. Pt admits to compliance. No chest pain, no source of infection on exam or hx. DKA appreciated, as she has increased AG with the elevated blood sugars. Protocol initiated in the ER with aggressive fluids and insulin drip. Stable for admission and optimization   I performed a history and physical examination of  Joy Butler and discussed her management with Dr. Hyman Hopes. I agree with the history, physical, assessment, and plan of care, with the following exceptions: None I was present for the following procedures: None  Time Spent in Critical Care of the patient: 40 minutes  Time spent in discussions with the patient and family: Dr. Francene Castle, MD 05/26/14 [phone]

## 2014-05-26 NOTE — Progress Notes (Signed)
ANTICOAGULATION CONSULT NOTE - Follow Up Consult  Pharmacy Consult for heparin gtt and warfarin Indication: hx of recurrent pulmonary embolus, lifelong anticoagulation (PTA non-compliance)  Allergies  Allergen Reactions  . Keflex [Cephalexin] Itching and Swelling    Lips and eyes swelled up  . Latex Itching  . Sulfa Antibiotics Rash  . Sulfur Rash    Patient Measurements: Height: 5\' 9"  (175.3 cm) Weight: 257 lb 8 oz (116.8 kg) IBW/kg (Calculated) : 66.2 Heparin Dosing Weight: 93 kg  Vital Signs: Temp: 97.4 F (36.3 C) (06/06 2134) Temp src: Oral (06/06 2134) BP: 142/80 mmHg (06/06 2134) Pulse Rate: 91 (06/06 2134)  Labs:  Recent Labs  05/25/14 1554 05/25/14 1833  05/25/14 2021 05/25/14 2313 05/26/14 0719 05/26/14 1540 05/26/14 2235  HGB 14.2 16.3*  --  16.0*  --  13.1  --   --   HCT 41.9 48.0*  --  47.0*  --  39.9  --   --   PLT 353  --   --   --   --  365  --   --   LABPROT 12.7  --   --   --   --  13.7  --   --   INR 0.97  --   --   --   --  1.07  --   --   HEPARINUNFRC  --   --   --   --   --  0.71* 0.77* 0.32  CREATININE 1.36* 1.50*  < > 1.50* 1.13* 1.08  --   --   TROPONINI  --   --   --   --  <0.30  --   --   --   < > = values in this interval not displayed.  Estimated Creatinine Clearance: 71.8 ml/min (by C-G formula based on Cr of 1.08).   Medications:  Scheduled:  . amLODipine  10 mg Oral Daily  . citalopram  20 mg Oral Daily  . clotrimazole  1 application Topical BID  . insulin aspart  0-15 Units Subcutaneous 6 times per day  . insulin glargine  55 Units Subcutaneous Daily  . nicotine  14 mg Transdermal Daily  . pantoprazole  40 mg Oral Daily  . predniSONE  45 mg Oral Q breakfast  . simvastatin  20 mg Oral q1800  . Warfarin - Pharmacist Dosing Inpatient   Does not apply q1800   Infusions:  . sodium chloride 1,000 mL (05/26/14 1342)  . heparin 1,150 Units/hr (05/26/14 1724)    Assessment: Heparin level therapeutic at 0.32 IU/ml with no  bleedind noted.   Goal of Therapy:  Heparin level 0.3-0.7 units/ml Monitor platelets by anticoagulation protocol: Yes   Plan:  -continue heparin at 1150 units/hr and f/u am labs.    Curlene Dolphin

## 2014-05-26 NOTE — Progress Notes (Signed)
Subjective: No complaints of leg pain. Feels better today. Says since she was started on prednisone for temporal arteritis, she feels her headache has improved.  Objective: Vital signs in last 24 hours: Filed Vitals:   05/26/14 0514 05/26/14 0800 05/26/14 0900 05/26/14 1100  BP: 142/77  129/85 156/89  Pulse: 83  81 87  Temp: 98.5 F (36.9 C) 97.7 F (36.5 C)    TempSrc: Axillary Oral    Resp: 14  20 14   Height:      Weight:      SpO2: 95%  95% 100%   Weight change:   Intake/Output Summary (Last 24 hours) at 05/26/14 1221 Last data filed at 05/26/14 0800  Gross per 24 hour  Intake 2128.37 ml  Output    825 ml  Net 1303.37 ml   GENERAL- NAD HEENT- Atraumatic, normocephalic,  EOMI,  neck supple, moist oral mucosa. CARDIAC- RRR, no murmurs, rubs or gallops. RESP- No wheezes or crackles. ABDOMEN- Soft, nontender,  bowel sounds present. BACK- Normal curvature of the spine, No tenderness along the vertebrae, no CVA tenderness. NEURO- No obvious Cr N abnormality, Normal strenght. EXTREMITIES- pulse 2+, symmetric, no pedal edema, no Calf tenderness. SKIN- Warm, dry, No rash or lesion. Psych- Appropriate speech and thought.  Lab Results: Basic Metabolic Panel:  Recent Labs Lab 05/25/14 2313 05/26/14 0719  NA 137 139  K 4.0 4.0  CL 100 103  CO2 22 24  GLUCOSE 275* 145*  BUN 27* 23  CREATININE 1.13* 1.08  CALCIUM 8.6 8.5   Liver Function Tests:  Recent Labs Lab 05/25/14 2313  AST 15  ALT 12  ALKPHOS 121*  BILITOT <0.2*  PROT 6.5  ALBUMIN 2.5*   CBC:  Recent Labs Lab 05/25/14 1554  05/25/14 2021 05/26/14 0719  WBC 13.6*  --   --  17.8*  NEUTROABS 12.7*  --   --  13.9*  HGB 14.2  < > 16.0* 13.1  HCT 41.9  < > 47.0* 39.9  MCV 83.3  --   --  84.0  PLT 353  --   --  365  < > = values in this interval not displayed. Cardiac Enzymes:  Recent Labs Lab 05/25/14 2313  TROPONINI <0.30   CBG:  Recent Labs Lab 05/25/14 2250 05/26/14 0025  05/26/14 0202 05/26/14 0354 05/26/14 0728 05/26/14 0955  GLUCAP 229* 278* 242* 229* 145* 120*   Coagulation:  Recent Labs Lab 05/25/14 1554 05/26/14 0719  LABPROT 12.7 13.7  INR 0.97 1.07   Urine Drug Screen: Drugs of Abuse     Component Value Date/Time   LABOPIA POSITIVE* 10/12/2012 0803   COCAINSCRNUR NONE DETECTED 10/12/2012 0803   LABBENZ NONE DETECTED 10/12/2012 0803   AMPHETMU NONE DETECTED 10/12/2012 0803   THCU NONE DETECTED 10/12/2012 0803   LABBARB NONE DETECTED 10/12/2012 0803    Urinalysis:  Recent Labs Lab 05/25/14 1609  COLORURINE YELLOW  LABSPEC 1.028  PHURINE 5.5  GLUCOSEU >1000*  HGBUR TRACE*  BILIRUBINUR NEGATIVE  KETONESUR NEGATIVE  PROTEINUR 100*  UROBILINOGEN 0.2  NITRITE NEGATIVE  LEUKOCYTESUR SMALL*    Micro Results: Recent Results (from the past 240 hour(s))  MRSA PCR SCREENING     Status: None   Collection Time    05/25/14  9:45 PM      Result Value Ref Range Status   MRSA by PCR NEGATIVE  NEGATIVE Final   Comment:            The GeneXpert MRSA Assay (  FDA     approved for NASAL specimens     only), is one component of a     comprehensive MRSA colonization     surveillance program. It is not     intended to diagnose MRSA     infection nor to guide or     monitor treatment for     MRSA infections.   Studies/Results: Dg Chest 2 View  05/25/2014   CLINICAL DATA:  Hyperglycemia.  EXAM: CHEST  2 VIEW  COMPARISON:  05/09/14.  FINDINGS: Low lung volumes accentuate the cardiomediastinal silhouette which is probably stable. Mild vascular congestion without overt infiltrates or failure. No pneumothorax.  IMPRESSION: Low lung volumes and mild vascular congestion without overt infiltrates or failure.  Slight worsening aeration from priors.   Electronically Signed   By: Rolla Flatten M.D.   On: 05/25/2014 15:53   Medications: I have reviewed the patient's current medications. Scheduled Meds: . amLODipine  10 mg Oral Daily  . citalopram   20 mg Oral Daily  . clotrimazole  1 application Topical BID  . insulin aspart  0-15 Units Subcutaneous 6 times per day  . insulin glargine  55 Units Subcutaneous Daily  . nicotine  14 mg Transdermal Daily  . pantoprazole  40 mg Oral Daily  . predniSONE  45 mg Oral Q breakfast  . simvastatin  20 mg Oral q1800  . warfarin  12.5 mg Oral ONCE-1800  . Warfarin - Pharmacist Dosing Inpatient   Does not apply q1800   Continuous Infusions: . sodium chloride 1,000 mL (05/26/14 0947)  . heparin 1,300 Units/hr (05/26/14 0948)   PRN Meds:.acetaminophen, hydrALAZINE Assessment/Plan: Principal Problem:   Uncontrolled type 2 DM with hyperosmolar nonketotic hyperglycemia Active Problems:   HTN (hypertension)   DM (diabetes mellitus), type 2   Recurrent pulmonary embolism   Long term (current) use of anticoagulants   Hypercholesteremia   Depression   Temporal arteritis   Swelling of left lower extremity  HHS- Blood sugars- on arrival in the ED- >600. With signs of dehydration. CBG- 120 this morning. AG down to 12 from 17 on admissision.  - Start normal diet - Cont Lantus- 55u daily - SSI- Mod - Cont IVF but at 58mls/hr.  Temporal Arteritis- Pt on chronic steroids. Pt reports improvement in headaches since starting Steroids. Will contribute to hyperglycemia. - Cont home prednisone- 50mg  daily.  Hx of PE- 2 previous episodes or PE,  with hx 1 episode of DVT. History of positive lupus antibody and elevated PTT in the past. She is on Coumadin at home, but INR is subtherapeutic on admission. - LE doppler pending - Cont heparin till doppler results are available. - Warfarin bridge per pharm.  HTN- Blood pressure elevated today- 156/89.  - continue amlodipine 10 mg daily.  - hydralazine IV prn for SBP>190   AKI: creatinine is elevated from 1.09 on 02/21/14 to 1.36(1.5 on istat) on admission. It is most likely due to dehydration and prerenal failure. Cr today- 1.08. Resolving. - IVF as above  -  check FeUrea- in process.   Leukocytosis- WBC- 17.8, Pt on steroids, which she started taking 2 weeks ago, therefore the most likely cause. No source of infection identified.  HLD: last LDL was 133 on 10/12/12. On pravastatin at home  - Cont with simvastatin in hospital.   Dispo: Disposition is deferred at this time, awaiting improvement of current medical problems.   The patient does have a current PCP Jessee Avers, MD) and does  need an [facility] hospital follow-up appointment after discharge.  The patient does not know have transportation limitations that hinder transportation to clinic appointments.  .Services Needed at time of discharge: Y = Yes, Blank = No PT:   OT:   RN:   Equipment:   Other:     LOS: 1 day   Jenetta Downer, MD 05/26/2014, 12:21 PM

## 2014-05-26 NOTE — Progress Notes (Addendum)
ANTICOAGULATION CONSULT NOTE - Follow Up Consult  Pharmacy Consult for heparin gtt and warfarin Indication: hx of recurrent pulmonary embolus, lifelong anticoagulation (PTA non-compliance)  Allergies  Allergen Reactions  . Keflex [Cephalexin] Itching and Swelling    Lips and eyes swelled up  . Latex Itching  . Sulfa Antibiotics Rash  . Sulfur Rash    Patient Measurements: Height: 5\' 9"  (175.3 cm) Weight: 257 lb 8 oz (116.8 kg) IBW/kg (Calculated) : 66.2 Heparin Dosing Weight: 93 kg  Vital Signs: Temp: 97.7 F (36.5 C) (06/06 0800) Temp src: Oral (06/06 0800) BP: 142/77 mmHg (06/06 0514) Pulse Rate: 83 (06/06 0514)  Labs:  Recent Labs  05/25/14 1554 05/25/14 1833  05/25/14 2021 05/25/14 2313 05/26/14 0719  HGB 14.2 16.3*  --  16.0*  --  13.1  HCT 41.9 48.0*  --  47.0*  --  39.9  PLT 353  --   --   --   --  365  LABPROT 12.7  --   --   --   --  13.7  INR 0.97  --   --   --   --  1.07  HEPARINUNFRC  --   --   --   --   --  0.71*  CREATININE 1.36* 1.50*  < > 1.50* 1.13* 1.08  TROPONINI  --   --   --   --  <0.30  --   < > = values in this interval not displayed.  Estimated Creatinine Clearance: 71.8 ml/min (by C-G formula based on Cr of 1.08).   Medications:  Scheduled:  . amLODipine  10 mg Oral Daily  . citalopram  20 mg Oral Daily  . clotrimazole  1 application Topical BID  . insulin aspart  0-15 Units Subcutaneous 6 times per day  . insulin glargine  55 Units Subcutaneous Daily  . nicotine  14 mg Transdermal Daily  . pantoprazole  40 mg Oral Daily  . predniSONE  45 mg Oral Q breakfast  . simvastatin  20 mg Oral q1800   Infusions:  . sodium chloride 1,000 mL (05/26/14 0837)  . heparin 1,400 Units/hr (05/25/14 2255)    Assessment: 63 yo F originally presented due to concerns of hyperglycemia (with associated malaise, polyuria, and polydipsia) after starting outpatient steroids.  Patient is on lifelong chronic anticoagulation (PTA warfarin) but admits to  non-compliance.   She has history of recurrent pulmonary embolism in 2011 and 2013, and has history of positive lupus antibody.  Pharmacy was consulted to start heparin gtt for anticoagulation due to subtherapeutic admission INR (0.97) and concerns of right heart strain on EKG.  Patient is s/p 4000 units bolus and now on heparin gtt rate of 1400 units/hr.  HL this morning has resulted slightly supratherapeutic at 0.71.  INR is still subtherapeutic 1.07, no warfarin doses given last night.  CBC is wnl and SCr has improved to 1.08 with CrCl ~72.  No bleeding issues noted.  PTA warfarin dose: 12.5mg  daily except 15mg  on M and Thurs  With history of non-compliance, will be conservative with initial dosing because her larger PTA doses may reflect non-compliance dose escalation.  Goal of Therapy:  INR 2-3 Heparin level 0.3-0.7 units/ml Monitor platelets by anticoagulation protocol: Yes   Plan:  - decrease heparin IV gtt to 1300 units/hr - draw 6h HL - daily HL and CBC - give warfarin PO 12.5mg  x1 dose tonight - daily INR - monitor for s/s of bleeding  Ovid Curd E. Jacqlyn Larsen, PharmD  Clinical Pharmacist - Resident Pager: [phone] Pharmacy: [phone] 05/26/2014 9:14 AM    Addendum ============================== The follow-up HL (after decreasing gtt rate to 1300 units/hr) is still supratherapeutic at 0.77.  Plan:  - decrease heparin IV gtt to 1150 units/hr - draw 6h HL - daily HL and CBC - continue plan to give warfarin PO 12.5mg  x1 dose tonight - daily INR - monitor for s/s of bleeding  Ovid Curd E. Jacqlyn Larsen, PharmD Clinical Pharmacist - Resident Pager: [phone] Pharmacy: [phone] 05/26/2014 4:30 PM

## 2014-05-26 NOTE — H&P (Signed)
Internal Medicine Attending Admission Note Date: 05/26/2014  Patient name: Joy Butler Medical record number: [id number] Date of birth: 29-Oct-1951 Age: 63 y.o. Gender: female  I saw and evaluated the patient. I reviewed the resident's note and I agree with the resident's findings and plan as documented in the resident's note.  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 This was associated with polyuria, polydipsia, and generalized malaise. She had left lower extremity edema without pain and some mild abdominal pain but denied any chest pain, fevers, shakes, chills, or vomiting. She was admitted to the internal medicine teaching service for further evaluation and care.  Initial evaluation revealed a glucose of 688 with an anion gap of 17 and a delta/delta ratio of 7 suggesting an anion gap metabolic acidosis with concomitant metabolic alkalosis. An arterial blood gas was not obtained to assess for a concomitant respiratory process although this is unlikely to be clinically significant. A venous blood gas did reveal a pH of 7.37. She was therefore admitted to the step down unit with mild diabetic ketoacidosis and dehydration. She was started on an insulin drip and IV fluids. With this therapy her blood sugars dropped below 200 and her anion gap decreased to 15 this morning. She is feeling much better at this time.   Physical Exam - key components related to admission:  Filed Vitals:   05/26/14 0514 05/26/14 0800 05/26/14 0900 05/26/14 1100  BP: 142/77  129/85 156/89  Pulse: 83  81 87  Temp: 98.5 F (36.9 C) 97.7 F (36.5 C)    TempSrc: Axillary Oral    Resp: 14  20 14   Height:      Weight:      SpO2:  95%  95% 100%   General: Well-developed, obese, woman lying comfortably in bed in no acute distress. Lungs: Clear to auscultation bilaterally without wheezes, rhonchi, or rales. Heart: Regular rate and rhythm without murmurs, rubs, or gallops. Abdomen: Soft, nontender, active bowel sounds. Extremities: Mild edema from the shin distally in the left lower extremity. There is no erythema or warmth.   Lab results:  Basic Metabolic Panel:  Recent Labs  05/25/14 2313 05/26/14 0719  NA 137 139  K 4.0 4.0  CL 100 103  CO2 22 24  GLUCOSE 275* 145*  BUN 27* 23  CREATININE 1.13* 1.08  CALCIUM 8.6 8.5   Liver Function Tests:  Recent Labs  05/25/14 2313  AST 15  ALT 12  ALKPHOS 121*  BILITOT <0.2*  PROT 6.5  ALBUMIN 2.5*   CBC:  Recent Labs  05/25/14 1554  05/25/14 2021 05/26/14 0719  WBC 13.6*  --   --  17.8*  NEUTROABS 12.7*  --   --  13.9*  HGB 14.2  < > 16.0* 13.1  HCT 41.9  < > 47.0* 39.9  MCV 83.3  --   --  84.0  PLT 353  --   --  365  < > = values in this interval not displayed. 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 She admits to noncompliance with her Coumadin although states she has been compliant with her insulin and prednisone. I am concerned that her medical regimen is possibly too complex as there seems to be some confusion as  to what medicine she is to be taking and how much. I suspect this is contributed to her recent hyperglycemia. Although she has small leukocytoclastic rate is in a very mild leuko-psychosis in her urine I doubt she has a significant urinary tract infection to explain the diabetic ketoacidosis. She has responded well to IV fluids and insulin.  Plan  1) Diabetic ketoacidosis, mild: We will continue with the IV fluids as she requires further hydration. The insulin drip was stopped and she was started on subcutaneous insulin. Her prednisone dose was appropriately lowered to 45 mg daily which was the plan that Dr. Alice Rieger outlined in his clinic note on May 27. This dose will be further decreased to 40 mg daily on June 10. We will stress the importance of compliance with her insulin regimen while on higher doses of prednisone. We will also taper the prednisone dose as quickly, but as safely, as possible.  2) Lower extremity edema: She's been noncompliant with her Coumadin and presents with lower extremity swelling. This is concerning for a deep venous thrombosis. She's been placed on heparin and a lower extremity Doppler is pending at the time of this dictation. She will likely require bridging anticoagulation upon discharge while she awaits her INR to become therapeutic once again.  3) Disposition: She is being transferred to the general medical ward, and if she does well overnight will likely be ready for discharge home tomorrow with followup in the [facility].

## 2014-05-26 NOTE — Progress Notes (Signed)
Pt. constantly requesting food c/o hunger after receiving supper and hs snack.

## 2014-05-26 NOTE — Progress Notes (Signed)
VASCULAR LAB PRELIMINARY  PRELIMINARY  PRELIMINARY  PRELIMINARY  Left lower extremity venous Doppler completed.    Preliminary report:  There is no DVT or SVT noted in the left lower extremity.  Iantha Fallen, RVT 05/26/2014, 2:45 PM

## 2014-05-27 DIAGNOSIS — E1100 Type 2 diabetes mellitus with hyperosmolarity without nonketotic hyperglycemic-hyperosmolar coma (NKHHC): Principal | ICD-10-CM

## 2014-05-27 DIAGNOSIS — I10 Essential (primary) hypertension: Secondary | ICD-10-CM | POA: Diagnosis not present

## 2014-05-27 DIAGNOSIS — N179 Acute kidney failure, unspecified: Secondary | ICD-10-CM | POA: Diagnosis not present

## 2014-05-27 DIAGNOSIS — M316 Other giant cell arteritis: Secondary | ICD-10-CM | POA: Diagnosis not present

## 2014-05-27 LAB — CBC
HCT: 37.7 % (ref 36.0–46.0)
Hemoglobin: 12.1 g/dL (ref 12.0–15.0)
MCH: 26.9 pg (ref 26.0–34.0)
MCHC: 32.1 g/dL (ref 30.0–36.0)
MCV: 84 fL (ref 78.0–100.0)
PLATELETS: 353 10*3/uL (ref 150–400)
RBC: 4.49 MIL/uL (ref 3.87–5.11)
RDW: 17.6 % — ABNORMAL HIGH (ref 11.5–15.5)
WBC: 14.9 10*3/uL — AB (ref 4.0–10.5)

## 2014-05-27 LAB — URINE CULTURE

## 2014-05-27 LAB — BASIC METABOLIC PANEL
BUN: 22 mg/dL (ref 6–23)
CALCIUM: 8.5 mg/dL (ref 8.4–10.5)
CHLORIDE: 103 meq/L (ref 96–112)
CO2: 21 mEq/L (ref 19–32)
CREATININE: 1.08 mg/dL (ref 0.50–1.10)
GFR calc non Af Amer: 53 mL/min — ABNORMAL LOW (ref >90)
GFR, EST AFRICAN AMERICAN: 62 mL/min — AB (ref >90)
Glucose, Bld: 304 mg/dL — ABNORMAL HIGH (ref 70–99)
Potassium: 3.7 mEq/L (ref 3.7–5.3)
Sodium: 138 mEq/L (ref 137–147)

## 2014-05-27 LAB — HEPARIN LEVEL (UNFRACTIONATED): Heparin Unfractionated: 0.5 IU/mL (ref 0.30–0.70)

## 2014-05-27 LAB — GLUCOSE, CAPILLARY
GLUCOSE-CAPILLARY: 198 mg/dL — AB (ref 70–99)
Glucose-Capillary: 316 mg/dL — ABNORMAL HIGH (ref 70–99)
Glucose-Capillary: 263 mg/dL — ABNORMAL HIGH (ref 70–99)
Glucose-Capillary: 192 mg/dL — ABNORMAL HIGH (ref 70–99)

## 2014-05-27 LAB — PROTIME-INR
INR: 1.21 (ref 0.00–1.49)
Prothrombin Time: 15 s (ref 11.6–15.2)

## 2014-05-27 MED ORDER — ENOXAPARIN (LOVENOX) PATIENT EDUCATION KIT
PACK | Freq: Once | Status: AC
  Filled 2014-05-27: qty 1

## 2014-05-27 MED ORDER — WARFARIN SODIUM 5 MG PO TABS: 12.5000 mg | ORAL_TABLET | Freq: Once | ORAL | Status: DC

## 2014-05-27 MED ORDER — ENOXAPARIN SODIUM 120 MG/0.8ML ~~LOC~~ SOLN
120.0000 mg | Freq: Once | SUBCUTANEOUS | Status: AC
  Administered 2014-05-27: 120 mg via SUBCUTANEOUS
  Filled 2014-05-27 (×2): qty 0.8

## 2014-05-27 MED ORDER — INSULIN ASPART 100 UNIT/ML ~~LOC~~ SOLN
0.0000 [IU] | Freq: Three times a day (TID) | SUBCUTANEOUS | Status: DC
  Administered 2014-05-27: 4 [IU] via SUBCUTANEOUS

## 2014-05-27 MED ORDER — PREDNISONE 5 MG PO TABS: ORAL_TABLET | ORAL | Status: DC

## 2014-05-27 MED ORDER — WARFARIN SODIUM 2.5 MG PO TABS
12.5000 mg | ORAL_TABLET | Freq: Once | ORAL | Status: DC
  Filled 2014-05-27: qty 1

## 2014-05-27 MED ORDER — INSULIN NPH ISOPHANE & REGULAR (70-30) 100 UNIT/ML ~~LOC~~ SUSP
45.0000 [IU] | Freq: Two times a day (BID) | SUBCUTANEOUS | Status: DC
Start: 2014-05-27 — End: 2014-06-14

## 2014-05-27 MED ORDER — INSULIN ASPART 100 UNIT/ML ~~LOC~~ SOLN
0.0000 [IU] | Freq: Every day | SUBCUTANEOUS | Status: DC
Start: 2014-05-27 — End: 2014-05-27

## 2014-05-27 MED ORDER — PREDNISONE 20 MG PO TABS: ORAL_TABLET | ORAL | Status: DC

## 2014-05-27 MED ORDER — ENOXAPARIN SODIUM 120 MG/0.8ML ~~LOC~~ SOLN: 120.0000 mg | Freq: Two times a day (BID) | SUBCUTANEOUS | Status: DC

## 2014-05-27 NOTE — Discharge Summary (Signed)
Name: Joy Butler MRN: [id number] DOB: 1951-02-07 63 y.o. PCP: Joy Avers, MD  Date of Admission: 05/25/2014  2:49 PM Date of Discharge: 05/27/2014 Attending Physician: Joy Butler  Discharge Diagnosis: Principal Problem:   Uncontrolled type 2 DM with hyperosmolar nonketotic hyperglycemia Active Problems:   HTN (hypertension)   DM (diabetes mellitus), type 2   Recurrent pulmonary embolism   Long term (current) use of anticoagulants   Hypercholesteremia   Depression   Temporal arteritis   Swelling of left lower extremity  Discharge Medications:   Medication List    STOP taking these medications       ibuprofen 800 MG tablet  Commonly known as:  ADVIL,MOTRIN      TAKE these medications       ACCU-CHEK AVIVA PLUS W/DEVICE Kit  Please check blood sugar 3 to 4 times daily. diag code 250.0. Insulin dependent     accu-chek softclix lancets  Use to check blood sugars 3 to 4 times daily. Dx code: 250.00. Insulin dependent.     acetaminophen 500 MG tablet  Commonly known as:  TYLENOL  Take 1,000 mg by mouth every 6 (six) hours as needed for moderate pain.     amLODipine 10 MG tablet  Commonly known as:  NORVASC  Take 1 tablet (10 mg total) by mouth daily.     Blood Pressure Kit  1 Units by Does not apply route daily.     citalopram 20 MG tablet  Commonly known as:  CELEXA  Take 20 mg by mouth daily.     clotrimazole 1 % cream  Commonly known as:  LOTRIMIN  Apply 1 application topically 2 (two) times daily.     EASY TOUCH INSULIN SYRINGE 31G X 5/16" 1 ML Misc  Generic drug:  Insulin Syringe-Needle U-100  USE AS DIRECTED TWICE DAILY     enoxaparin 120 MG/0.8ML injection  Commonly known as:  LOVENOX  Inject 0.8 mLs (120 mg total) into the skin every 12 (twelve) hours.     glucose blood test strip  Commonly known as:  ACCU-CHEK AVIVA  Please check blood sugar 3 to 4 times daily. diag code 250.0. Insulin dependent     insulin NPH-regular Human (70-30) 100  UNIT/ML injection  Commonly known as:  NOVOLIN 70/30  Inject 45 Units into the skin 2 (two) times daily with a meal.     loperamide 2 MG capsule  Commonly known as:  IMODIUM  Take 1 capsule (2 mg total) by mouth 2 (two) times daily as needed for diarrhea or loose stools.     losartan-hydrochlorothiazide 100-25 MG per tablet  Commonly known as:  HYZAAR  Take 1 tablet by mouth daily.     metFORMIN 500 MG tablet  Commonly known as:  GLUCOPHAGE  Take 1 tablet (500 mg total) by mouth 2 (two) times daily with a meal.     omeprazole 20 MG capsule  Commonly known as:  PRILOSEC  Take 1 capsule (20 mg total) by mouth daily.     pravastatin 40 MG tablet  Commonly known as:  PRAVACHOL  Take 40 mg by mouth every evening.     predniSONE 20 MG tablet  Commonly known as:  DELTASONE  Take 2 tablets daily (8m) until 6/23, then 1.5 tablet daily (352m from 6/24 to 06/26/14.     predniSONE 5 MG tablet  Commonly known as:  DELTASONE  Take one tablet daily on 6/8 and 6/9, then one tablet daily from 6/24 to  7/7.     warfarin 5 MG tablet  Commonly known as:  COUMADIN  Take 2.5 tablets (12.5 mg total) by mouth one time only at 6 PM.        Disposition and follow-up:   Joy Butler was discharged from Joy Butler in Good condition.  At the hospital follow up visit please address:  1.  -Assure compliance with prednisone taper for her giant cell arteritis--  Per Dr. Alice Butler, prednisone taper as: 65m daily until 6/9, 418mdaily for 2 weeks starting 6/10, followed by 3534maily for 2 weeks starting 6/24. Patient given prescription for tx until 7/7, therefore, will need new prescription for Prednsione 32m63mily for 2 weeks starting 7/8, followed by 25mg80mly starting on 7/22, 20mg 13my starting on August 5th, and so one until she is down to 10mg d45m. Of note, patient needs prescription to be filled locally (?[facility]) for prednisone (may need 2 Rx with one for  5mg tab85ms, another for 10 or 20mg tab53m).        -Assure compliance with increased dose of insulin to Novolog 70/30 45 units BID and that pt is checking BS at least TID.       -Assure compliance with Lovenox injections and with warfarin.   2.  Labs / imaging needed at time of follow-up: Repeat INR on 6/10 (goal of 2-3).  3.  Pending labs/ test needing follow-up: None  Follow-up Appointments:     Follow-up Information   Follow up with Joy Butler, Joy Aversedule an appointment as soon as possible for a visit in 3 days.   Specialty:  Internal Medicine   Contact information:   [id number] Instructions: Discharge Instructions   Diet - low sodium heart healthy    Complete by:  As directed      Increase activity slowly    Complete by:  As directed            Consultations:  Care management  Procedures Performed:  Dg Chest 2 View  05/25/2014   CLINICAL DATA:  Hyperglycemia.  EXAM: CHEST  2 VIEW  COMPARISON:  05/09/14.  FINDINGS: Low lung volumes accentuate the cardiomediastinal silhouette which is probably stable. Mild vascular congestion without overt infiltrates or failure. No pneumothorax.  IMPRESSION: Low lung volumes and mild vascular congestion without overt infiltrates or failure.  Slight worsening aeration from priors.   Electronically Signed   By: John  CurRolla Flattenn: 05/25/2014 15:53   Dg Chest 2 View  05/09/2014   CLINICAL DATA:  Preoperative exam, hypertension  EXAM: CHEST  2 VIEW  COMPARISON:  DG CHEST 2 VIEW dated 10/11/2012  FINDINGS: The heart size and mediastinal contours are within normal limits. Both lungs are clear with the exception of bilateral mid lung zone scarring. The aorta is unfolded and ectatic. The visualized skeletal structures are unremarkable.  IMPRESSION: No active cardiopulmonary disease.   Electronically Signed   By: Gretchen Conchita Parisn: 05/09/2014 11:18   Admission HPI:  Joy RJAMACIA JESTERy.o. [id number] with pmhx of DM II, HTN, HLD, splenic infarct, PE in 2011, lifelong anticoagulation, suspected temporal arteritis on prednisone 50 mg daily who presents with a cc of hyperglycemia. The patient noticed that her blood sugar was rising since starting steroids for TA (started on 4/16.15, but patient states she has only been taking it for  a couple of weeks). Over the last 3-4 days her blood sugars have been much higher. Over the last two days, the blood sugar have been too high to read on her home glucometer. She has associated symptoms of malaise, polyuria and polydipsia. She reports compliance with her home insulin regimen of 40 U of 70/30 BID. Further, she reports compliance with the recent addition of metformin that was added on 05/16/14. She denies abdominal pain, fevers, chills, nausea, vomiting. She does admit to anorexia and decreased PO intake over the last few days.  On ROS, she notes pain and swelling of her left leg. She complained of this at her recent visit with Dr. Alice Butler who ordered a LE doppler that was not completed. The patient is on lifelong anticoagulation with coumadin, but has been subtherapeutic for the last 11 months. She admits to missing doses of her coumadin. She denies chest pain, SOB, cough and hemoptysis.  The patient also has a recent history of diarrhea for 10 days that resolved with starting imodium no 5/27.    Hospital Course by problem list: Hyperosmolar Hyperglycemic state- Blood sugar over 600s on presentation. Etiology most likely ongoing use of prednisone 62m daily which she started two weeks ago for the treatment of temporal arteritis. She assured compliance with her home insulin of Novolog 70/30 40 units BID ac. She had AG of 17, VBG with pH of 7.37, but with no ketones in her urine making DKA unlikely. She was fluid resuscitated and started on insulin drip. The insulin drip was discontinued the next day as her blood glucose normalized and she was  transferred out of SDU. Her BS remained stable with a carb modified diet and Lantus 55 units and SSI.  She will be discharged with increased dose of Novolog 70/30 to 45 units BID ac and instruction to check her BS at least TID. [facility]   Temporal Arteritis- She had right sided temporal headache with some vision changes last month and was started on prednisone taper for treatment of possible giant cell arteritis. She had right temporal artery biposy on 05/09/14 which with pathology report negative. Nonetheless, she has started the prednisone taper two weeks ago and will have a slow taper by 10% every 2 weeks with continuation of maintenance dose of therapy from 981moo 1 yr (please refer to Dr. KaArsenio Katzffice note from 05/16/14 for further details). She reports improvement of her right temporal headache since starting prednisone. Prior to her discharge she reported to Care Management that she will be able to afford her medications and was given prescription of taper prednisone to last until 06/26/14 and will need another prescription before then for continuation of this taper--of note, the patient prefers a maInvestment banker, corporateor most of her medications but will need to fill this prescription locally as this will be a changing dose/taper for a while. [facility] her treatment and side effects.   History of PE- She has two previous episodes or PE and history of one episode of DVT. She has had positive lupus antibody and elevated PTT in the past and was determined to be high risk for thrombotic event with recommendation for lifetime anticoagulation. She is on Coumadin at home but admits to noncompliance which was evident by her sub therapeutic INR. She had left lower extremity edema with calf asymmetry on presentation which was concerning for DVT and  was promptly started on heparin drip. She had no signs or  symptoms of PE at that time. LE Doppler was negative for left LE DVT or SVT and the heparin drip was stopped. She was started on warfarin and will be discharged to home with Lovenox injections (6 serynges) for bridging until her INR becomes therapeutic. Her INR was 1.21 on discharge.  She will follow up at the [facility] with INR check recommended for 6/10.   Hypertension- Her blood pressure well controlled on day of discharge. We continued her home amlodipine 10 mg daily.   Acute renal failure- Creatinine was elevated from 1.09 on 02/21/14 to 1.36(1.5 on istat) on admission. Most likely due to dehydration secondary to hyperosmotic hyperglycemia with prerenal azotemia. Her creatine improved with IV fluid resuscitation. Her creatine was 1.08, at baseline, on the day of her discharge.    Leukocytosis- WBC elevated to 17.8 during this hospitalization with no signs or symptoms of infection--afebrile, with no findings of PNA on CXR, or indication of UTI on UA, no rash or skin infection, no URI. She is on steroid therapy for temporal arteritis and this was thought to be the most likely cause for her leucocytosis.    Hyperlipidemia: Her last LDL was 133 on 10/12/12, she is on pravastatin at home. We continued statin therapy while she was in the hospital with simvastatin as the formulary preference. She will resume home pravastatin on discharge.    Discharge Vitals:   BP 139/64  Pulse 93  Temp(Src) 97.8 F (36.6 C) (Oral)  Resp 16  Ht 5' 9" (1.753 m)  Wt 270 lb 8.1 oz (122.7 kg)  BMI 39.93 kg/m2  SpO2 99%  Discharge Labs:  Results for orders placed during the hospital encounter of 05/25/14 (from the past 24 hour(s))  GLUCOSE, CAPILLARY     Status: Abnormal   Collection Time    05/26/14  9:32 PM      Result Value Ref Range   Glucose-Capillary 299 (*) 70 - 99 mg/dL   Comment 1 Notify RN    HEPARIN LEVEL (UNFRACTIONATED)     Status: None   Collection Time    05/26/14 10:35 PM      Result Value Ref Range     Heparin Unfractionated 0.32  0.30 - 0.70 IU/mL  GLUCOSE, CAPILLARY     Status: Abnormal   Collection Time    05/27/14  1:26 AM      Result Value Ref Range   Glucose-Capillary 316 (*) 70 - 99 mg/dL   Comment 1 Notify RN    HEPARIN LEVEL (UNFRACTIONATED)     Status: None   Collection Time    05/27/14  4:00 AM      Result Value Ref Range   Heparin Unfractionated 0.50  0.30 - 0.70 IU/mL  CBC     Status: Abnormal   Collection Time    05/27/14  4:00 AM      Result Value Ref Range   WBC 14.9 (*) 4.0 - 10.5 K/uL   RBC 4.49  3.87 - 5.11 MIL/uL   Hemoglobin 12.1  12.0 - 15.0 g/dL   HCT 37.7  36.0 - 46.0 %   MCV 84.0  78.0 - 100.0 fL   MCH 26.9  26.0 - 34.0 pg   MCHC 32.1  30.0 - 36.0 g/dL   RDW 17.6 (*) 11.5 - 15.5 %   Platelets 353  150 - 400 K/uL  BASIC METABOLIC PANEL     Status: Abnormal  Collection Time    05/27/14  4:00 AM      Result Value Ref Range   Sodium 138  137 - 147 mEq/L   Potassium 3.7  3.7 - 5.3 mEq/L   Chloride 103  96 - 112 mEq/L   CO2 21  19 - 32 mEq/L   Glucose, Bld 304 (*) 70 - 99 mg/dL   BUN 22  6 - 23 mg/dL   Creatinine, Ser 1.08  0.50 - 1.10 mg/dL   Calcium 8.5  8.4 - 10.5 mg/dL   GFR calc non Af Amer 53 (*) >90 mL/min   GFR calc Af Amer 62 (*) >90 mL/min  GLUCOSE, CAPILLARY     Status: Abnormal   Collection Time    05/27/14  4:02 AM      Result Value Ref Range   Glucose-Capillary 263 (*) 70 - 99 mg/dL   Comment 1 Notify RN    GLUCOSE, CAPILLARY     Status: Abnormal   Collection Time    05/27/14  7:58 AM      Result Value Ref Range   Glucose-Capillary 192 (*) 70 - 99 mg/dL  PROTIME-INR     Status: None   Collection Time    05/27/14 10:54 AM      Result Value Ref Range   Prothrombin Time 15.0  11.6 - 15.2 seconds   INR 1.21  0.00 - 1.49  GLUCOSE, CAPILLARY     Status: Abnormal   Collection Time    05/27/14 12:26 PM      Result Value Ref Range   Glucose-Capillary 198 (*) 70 - 99 mg/dL    Signed: Blain Pais, MD 05/27/2014, 6:14  PM   Time Spent on Discharge: 60 minutes Services Ordered on Discharge: None Equipment Ordered on Discharge: None

## 2014-05-27 NOTE — Progress Notes (Signed)
ANTICOAGULATION CONSULT NOTE - Follow Up Consult  Pharmacy Consult for heparin gtt and warfarin Indication: hx of recurrent pulmonary embolus, lifelong anticoagulation (PTA non-compliance)  Allergies  Allergen Reactions  . Keflex [Cephalexin] Itching and Swelling    Lips and eyes swelled up  . Latex Itching  . Sulfa Antibiotics Rash  . Sulfur Rash    Patient Measurements: Height: 5\' 9"  (175.3 cm) Weight: 270 lb 8.1 oz (122.7 kg) IBW/kg (Calculated) : 66.2 Heparin Dosing Weight: 93 kg  Vital Signs: Temp: 98.4 F (36.9 C) (06/07 0938) Temp src: Oral (06/07 0938) BP: 125/75 mmHg (06/07 0938) Pulse Rate: 89 (06/07 0938)  Labs:  Recent Labs  05/25/14 1554  05/25/14 2021 05/25/14 2313  05/26/14 0719 05/26/14 1540 05/26/14 2235 05/27/14 0400 05/27/14 1054  HGB 14.2  < > 16.0*  --   --  13.1  --   --  12.1  --   HCT 41.9  < > 47.0*  --   --  39.9  --   --  37.7  --   PLT 353  --   --   --   --  365  --   --  353  --   LABPROT 12.7  --   --   --   --  13.7  --   --   --  15.0  INR 0.97  --   --   --   --  1.07  --   --   --  1.21  HEPARINUNFRC  --   --   --   --   < > 0.71* 0.77* 0.32 0.50  --   CREATININE 1.36*  < > 1.50* 1.13*  --  1.08  --   --  1.08  --   TROPONINI  --   --   --  <0.30  --   --   --   --   --   --   < > = values in this interval not displayed.  Estimated Creatinine Clearance: 73.8 ml/min (by C-G formula based on Cr of 1.08).   Assessment: Heparin level therapeutic at 0.5 with no bleeding noted. INR up to 1.21  Goal of Therapy:  Heparin level 0.3-0.7 units/ml Monitor platelets by anticoagulation protocol: Yes   Plan:  -continue heparin at 1150 units/hr -warfarin 12.5mg  as per previouis dose -daily heparin level, INR, and CBC    Gearldine Bienenstock Judie Bonus

## 2014-05-27 NOTE — Progress Notes (Signed)
Discharge instructions and prescriptions for prednisone and coumadin given and explained to pt.  Pt. Verbalized understanding of all orders/instructions.  Pt. Also instructed on how to administer lovenox shots and denies any questions.  Pt. Is familiar with using the lovenox shots.  IV removed by NT.  VSS.  Pt. In stable condition for discharge. Syliva Overman

## 2014-05-27 NOTE — Progress Notes (Signed)
Subjective: No overnight events. Reports feeling much better today. Doppler was negative for DVT or SVT of left LE. She is constipated but wants to take Miralax once she goes home.   Objective: Vital signs in last 24 hours: Filed Vitals:   05/26/14 1820 05/26/14 2134 05/27/14 0544 05/27/14 0938  BP: 132/79 142/80 140/77 125/75  Pulse: 90 91 86 89  Temp: 97.7 F (36.5 C) 97.4 F (36.3 C) 97.6 F (36.4 C) 98.4 F (36.9 C)  TempSrc: Oral Oral Oral Oral  Resp: 16 17 19 16   Height:      Weight:   270 lb 8.1 oz (122.7 kg)   SpO2:  100% 99% 98%   Weight change: 30 lb 8.1 oz (13.837 kg)  Intake/Output Summary (Last 24 hours) at 05/27/14 1025 Last data filed at 05/27/14 0131  Gross per 24 hour  Intake    300 ml  Output    850 ml  Net   -550 ml   Vitals reviewed. General: Sitting up in chair, in NAD HEENT: no scleral icterus Cardiac: RRR, no rubs, murmurs or gallops Pulm: clear to auscultation bilaterally, no wheezes, rales, or rhonchi Abd: soft, nontender, nondistended, BS present Ext: warm and well perfused,Left calf bigger than right but with no increased warmth, no erythema, not tender. Pedal pulses 2+ bilaterally.  Neuro: alert and oriented X3, moves 4 extremities voluntairly Lab Results: Basic Metabolic Panel:  Recent Labs Lab 05/26/14 0719 05/27/14 0400  NA 139 138  K 4.0 3.7  CL 103 103  CO2 24 21  GLUCOSE 145* 304*  BUN 23 22  CREATININE 1.08 1.08  CALCIUM 8.5 8.5   Liver Function Tests:  Recent Labs Lab 05/25/14 2313  AST 15  ALT 12  ALKPHOS 121*  BILITOT <0.2*  PROT 6.5  ALBUMIN 2.5*   CBC:  Recent Labs Lab 05/25/14 1554  05/26/14 0719 05/27/14 0400  WBC 13.6*  --  17.8* 14.9*  NEUTROABS 12.7*  --  13.9*  --   HGB 14.2  < > 13.1 12.1  HCT 41.9  < > 39.9 37.7  MCV 83.3  --  84.0 84.0  PLT 353  --  365 353  < > = values in this interval not displayed. Cardiac Enzymes:  Recent Labs Lab 05/25/14 2313  TROPONINI <0.30   BNP: No  results found for this basename: PROBNP,  in the last 168 hours D-Dimer: No results found for this basename: DDIMER,  in the last 168 hours CBG:  Recent Labs Lab 05/26/14 1302 05/26/14 1535 05/26/14 2132 05/27/14 0126 05/27/14 0402 05/27/14 0758  GLUCAP 144* 264* 299* 316* 263* 192*   Coagulation:  Recent Labs Lab 05/25/14 1554 05/26/14 0719  LABPROT 12.7 13.7  INR 0.97 1.07   Urine Drug Screen: Drugs of Abuse     Component Value Date/Time   LABOPIA POSITIVE* 10/12/2012 0803   COCAINSCRNUR NONE DETECTED 10/12/2012 0803   LABBENZ NONE DETECTED 10/12/2012 0803   AMPHETMU NONE DETECTED 10/12/2012 0803   THCU NONE DETECTED 10/12/2012 0803   LABBARB NONE DETECTED 10/12/2012 0803    Urinalysis:  Recent Labs Lab 05/25/14 1609  COLORURINE YELLOW  LABSPEC 1.028  PHURINE 5.5  GLUCOSEU >1000*  HGBUR TRACE*  BILIRUBINUR NEGATIVE  KETONESUR NEGATIVE  PROTEINUR 100*  UROBILINOGEN 0.2  NITRITE NEGATIVE  LEUKOCYTESUR SMALL*   Micro Results: Recent Results (from the past 240 hour(s))  MRSA PCR SCREENING     Status: None   Collection Time    05/25/14  9:45 PM      Result Value Ref Range Status   MRSA by PCR NEGATIVE  NEGATIVE Final   Comment:            The GeneXpert MRSA Assay (FDA     approved for NASAL specimens     only), is one component of a     comprehensive MRSA colonization     surveillance program. It is not     intended to diagnose MRSA     infection nor to guide or     monitor treatment for     MRSA infections.   Studies/Results: Dg Chest 2 View  05/25/2014   CLINICAL DATA:  Hyperglycemia.  EXAM: CHEST  2 VIEW  COMPARISON:  05/09/14.  FINDINGS: Low lung volumes accentuate the cardiomediastinal silhouette which is probably stable. Mild vascular congestion without overt infiltrates or failure. No pneumothorax.  IMPRESSION: Low lung volumes and mild vascular congestion without overt infiltrates or failure.  Slight worsening aeration from priors.    Electronically Signed   By: Rolla Flatten M.D.   On: 05/25/2014 15:53   Medications: I have reviewed the patient's current medications. Scheduled Meds: . amLODipine  10 mg Oral Daily  . citalopram  20 mg Oral Daily  . clotrimazole  1 application Topical BID  . insulin aspart  0-15 Units Subcutaneous 6 times per day  . insulin glargine  55 Units Subcutaneous Daily  . nicotine  14 mg Transdermal Daily  . pantoprazole  40 mg Oral Daily  . predniSONE  45 mg Oral Q breakfast  . simvastatin  20 mg Oral q1800  . Warfarin - Pharmacist Dosing Inpatient   Does not apply q1800   Continuous Infusions: . heparin 1,150 Units/hr (05/26/14 1724)   PRN Meds:.acetaminophen, hydrALAZINE Assessment/Plan: Hyperosmolar Hyperglycemic state- Blood sugars- on arrival in the ED- >600. With signs of dehydration. CBG-300 this morning. AG down to 12 yesterday but up to 14 this am.  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 Pt reports improvement in headaches since starting Steroids. Will contribute to hyperglycemia.  - Cont home prednisone- 45mg  daily--to be decreased to 40mg  daily on June 10th  Hx of PE- 2 previous episodes or PE, with hx 1 episode of DVT. History of positive lupus antibody and elevated PTT in the past. She is on Coumadin at home, but INR is subtherapeutic on admission. LE Doppler with no DVT or SVT in left lower extremity.  - Discontinue heparin drip--will bridge with Lovenox injection as her AKI has resolved.   - Warfarin per pharmacy.   HTN- Blood pressure well controlled today - continue amlodipine 10 mg daily.  - hydralazine IV prn for SBP>190   AKI- Resolved. Creatinine is elevated from 1.09 on 02/21/14 to 1.36(1.5 on istat) on admission. Most likely due to dehydration 2/2 to and prerenal failure. Cr today- 1.08.  - IVF as above   Leukocytosis- WBC- 14.8. Pt on steroids, which she started taking 2 weeks ago, which is the  most likely cause. No source of infection identified.   HLD: last LDL was 133 on 10/12/12. On pravastatin at home  - Cont simvastatin in hospital.   Dispo: Disposition is deferred at this time, awaiting improvement of current medical problems. She will be discharged today with Lovenox injections at home for bridging until her INR becomes therapeutic. She has a [facility] appointment on Wednesday.   The patient does have a current  PCP (Jessee Avers, MD) and does need an [facility] hospital follow-up appointment after discharge.  The patient does not have transportation limitations that hinder transportation to clinic appointments.  .Services Needed at time of discharge: Y = Yes, Blank = No PT:   OT:   RN:   Equipment:   Other:     LOS: 2 days   Blain Pais, MD 05/27/2014, 10:25 AM

## 2014-05-27 NOTE — Progress Notes (Signed)
Internal Medicine Attending  Date: 05/27/2014  Patient name: Joy Butler Medical record number: [id number] Date of birth: January 21, 1951 Age: 63 y.o. Gender: female  I saw and evaluated the patient. I reviewed the resident's note by Dr. Hayes Ludwig and I agree with the resident's findings and plans as documented in her progress note.  She is stable for discharge today on her home dose of Lantus and novalog.  The goal is to lower the dose of the prednisone, which Dr. Alice Rieger has planned as outlined in his most recent clinic note.  She is due for a further dose reduction to 40 mg daily on Wednesday.

## 2014-05-27 NOTE — Care Management Note (Signed)
    Page 1 of 1   05/27/2014     4:34:25 PM CARE MANAGEMENT NOTE 05/27/2014  Patient:  Joy Butler, Joy Butler   Account Number:  [id number]  Date Initiated:  05/27/2014  Documentation initiated by:  [facility]  Subjective/Objective Assessment:   adm: Hyperglycemia     Action/Plan:   discharge planning   Anticipated DC Date:  05/27/2014   Anticipated DC Plan:  [place]  Medication Assistance  CM consult      Choice offered to / List presented to:             Status of service:  Completed, signed off Medicare Important Message given?   (If response is "NO", the following Medicare IM given date fields will be blank) Date Medicare IM given:   Date Additional Medicare IM given:    Discharge Disposition:  HOME/SELF CARE  Per UR Regulation:    If discussed at Long Length of Stay Meetings, dates discussed:    Comments:  05/27/14 14:30 CM received call from MD as pt is concerned she will not be able to afford her enoxaparin injections. CM faxed prescription with copied insurance card and facesheet to [facility] on [address].  Pharmacist ran prescription and quoted a $2.68 price for the prescription. MD made aware.  CM spoke with pt in room, gave her insurance card back, and gave her a handout of [facility] on [address] location and store hours and contact number  so she can drop the remaining prescriptions off without getting out of her car.  No other CM needs were communicated.  Mariane Masters, BSN, CM [phone].

## 2014-05-28 ENCOUNTER — Telehealth: Payer: Self-pay | Admitting: Dietician

## 2014-05-28 NOTE — Telephone Encounter (Signed)
Patient's phone was not accepting incoming calls. Will try again later.

## 2014-05-29 ENCOUNTER — Telehealth: Payer: Self-pay | Admitting: Licensed Clinical Social Worker

## 2014-05-29 NOTE — Telephone Encounter (Signed)
CSW received call from Garber, Thea Silversmith.  Ms. Juleen China states [facility] SW, pharmacist and RN care manager have been unable to reach [name]. Messages have been left with no return response.  Letter from [facility] will be mailed out.

## 2014-05-30 ENCOUNTER — Ambulatory Visit (INDEPENDENT_AMBULATORY_CARE_PROVIDER_SITE_OTHER): Payer: Medicare Other | Admitting: Internal Medicine

## 2014-05-30 ENCOUNTER — Encounter: Payer: Self-pay | Admitting: Internal Medicine

## 2014-05-30 VITALS — BP 124/78 | HR 106 | Temp 99.3°F | Ht 69.0 in | Wt 262.9 lb

## 2014-05-30 DIAGNOSIS — E119 Type 2 diabetes mellitus without complications: Secondary | ICD-10-CM

## 2014-05-30 DIAGNOSIS — I2699 Other pulmonary embolism without acute cor pulmonale: Secondary | ICD-10-CM

## 2014-05-30 DIAGNOSIS — M316 Other giant cell arteritis: Secondary | ICD-10-CM | POA: Diagnosis not present

## 2014-05-30 DIAGNOSIS — Z7901 Long term (current) use of anticoagulants: Secondary | ICD-10-CM

## 2014-05-30 DIAGNOSIS — Z86711 Personal history of pulmonary embolism: Secondary | ICD-10-CM | POA: Diagnosis not present

## 2014-05-30 LAB — POCT INR: INR: 3.1

## 2014-05-30 LAB — GLUCOSE, CAPILLARY: Glucose-Capillary: 140 mg/dL — ABNORMAL HIGH (ref 70–99)

## 2014-05-30 MED ORDER — WARFARIN SODIUM 5 MG PO TABS: 12.5000 mg | ORAL_TABLET | Freq: Every day | ORAL | Status: DC

## 2014-05-30 NOTE — Progress Notes (Deleted)
Subjective:    Patient ID: Joy Butler is a 63 y.o. female.  Chief Complaint: {VKF:84037} {Additional complaints:19314::" "} Environmental History: {environmental history:[phone]} {Common ambulatory SmartLinks:19316::" "}  Review of Systems  Objective:  Physical Exam  Laboratory:  {Allergy studies:[phone]}  Assessment:   {Assessment:19306}  Plan:   {Plan:19305}

## 2014-05-30 NOTE — Progress Notes (Signed)
Case discussed with Dr.Kazibwe at the time of the visit.  We reviewed the resident's history and exam and pertinent patient test results.  I agree with the assessment, diagnosis, and plan of care documented in the resident's note.    

## 2014-05-30 NOTE — Patient Instructions (Addendum)
General Instructions: Please continue with Prednisone 40 mg daily for 2 weeks days (from tomorrow 05/31/2014 to 06/13/2014) Then take Prednisone 35 mg daily for 2 weeks (from 06/14/2014 to 06/28/2014).  I plan to see sometime around 7/7-7/09/2014 to continue guiding you on your prednisone regimen.  If you are unable to keep you appointment at that time please then take Prednisone 30 mg daily for 2 weeks, (from 06/29/2014 to 07/13/2014) and call for further instruction or be sure to come for an office visit. Please Metformin 500 mg twice daily  Please take your insulin at 45 units twice daily  Please stop taking Lovenox shots  Pleas take your Warfarin 12.5 mg on all days except on Monday and Thursday patient takes 15mg   (use the 5mg  strength tablets)  Please follow up with Dr Elie Confer in one week to see if changes in your coumadin dose is needed.  Please bring your medications on every visit so you doctor can be aware of what you take and make changes as needed.  I hope you continue to feel better.

## 2014-05-30 NOTE — Assessment & Plan Note (Signed)
She cont to have improvement on steroids. She reports compliance.  Plan. - will cont with steroid taper at a 10% reduction every 2 weeks as below   Prednisone 45mg  daily for 2 weeks started on 05/16/2014 to 05/30/2014 (today). Will cont with Prednisone 40 mg daily for 2 weeks days (from 05/31/2014 to 06/13/2014) Then  Prednisone 35 mg daily for 2 weeks (from 06/14/2014 to 06/28/2014). I will see her again sometime around 7/7-7/10 and we can cont with the taper as Prednisone 30 mg daily for 2 weeks, (from 06/29/2014 to 07/13/2014 and so forth. - I will titrate it further to a dose of 10 mg. Then prednisone taper will be slowed substantially, such that patients remain on some prednisone (in progressively lower doses) for 9 to 12 months - starting sometime in February 2016. After that,  we can start tapering in 1 mg decrements per month once the daily dose is less than 10 mg is appropriate. -she currently has the [facility] team follow up with her to make sure she is compliant with this quite complex regimen. I encouraged her to keep in close touch with her home health services.  -Her insulin was increased recently after an episode of hyperglycemia due to steroids

## 2014-05-30 NOTE — Progress Notes (Signed)
Patient ID: Joy Butler, female   DOB: 12-21-1951, 63 y.o.   MRN: [id number]  Subjective:   HPI: [name] is a 63 y.o. woman with past medical history of uncontrolled diabetes, hypertension, history of pulmonary embolism on chronic Coumadin therapy, presents to the for hospital followup visit. She was discharged from the hospital 3 days ago after steroid-induced hyperglycemia in the setting of uncontrolled diabetes.  Since returning home, patient reports, that she's been feeling well. She reports that her blood sugars have been running between 80s and 160. She has no episodes of hypoglycemia. She reports compliance with prednisone as prescribed from her discharge at a dose of 40 mg daily. She doesn't have her medications today. She was also discharged with Lovenox bridging for her Coumadin, which was subtherapeutic.  DM2, uncontrolled -  Lab Results  Component Value Date   HGBA1C 13.3 05/16/2014   She infrequently checks her blood sugar due to fear of needle prick. However, she reports that she is compliant with insulin. Currently taking NPH-regular 70/30 45 units twice a day since hospital discharge. Patient misses doses 0-1 x per week on average.  0 hypoglycemic episodes since last visit. denies polyuria, polydipsia, nausea, vomiting, diarrhea.  does request refills today. She did not bring her glucose meter today.    In regards to diabetic complications:  Microvascular complications: Confirms: peripheral neuropathy ;    Important diabetic medications: Is patient on aspirin? No. Patient takes coumadin  Is patient on a statin? Yes Is patient on an ACE-I/ ARB? Yes  Kindly see the A&P for the status of the pt's chronic medical problems.   Past Medical History  Diagnosis Date  . Diabetes mellitus     diagnosed in 2000.    Marland Kitchen Hypertension   . Pulmonary embolism     2011 treated at [facility] in [place].  Was on Coumadin for over a year.  no known family history  . UTI  (urinary tract infection)   . High cholesterol   . 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 Depression   . Neuropathy     feet  . Pneumonia   . Kidney stones   . Headache(784.0)     migraines ( 12 years ago)   Current Outpatient Prescriptions  Medication Sig Dispense Refill  . acetaminophen (TYLENOL) 500 MG tablet Take 1,000 mg by mouth every 6 (six) hours as needed for moderate pain.      Marland Kitchen amLODipine (NORVASC) 10 MG tablet Take 1 tablet (10 mg total) by mouth daily.  90 tablet  3  . Blood Glucose Monitoring Suppl (ACCU-CHEK AVIVA PLUS) W/DEVICE KIT Please check blood sugar 3 to 4 times daily. diag code 250.0. Insulin dependent  1 kit  0  . Blood Pressure KIT 1 Units by Does not apply route daily.  1 each  0  . citalopram (CELEXA) 20 MG tablet Take 20 mg by mouth daily.      . clotrimazole (LOTRIMIN) 1 % cream Apply 1 application topically 2 (two) times daily.  30 g  0  . EASY TOUCH INSULIN SYRINGE 31G X 5/16" 1 ML MISC USE AS DIRECTED TWICE DAILY  100 each  6  . enoxaparin (LOVENOX) 120 MG/0.8ML injection Inject 0.8 mLs (120 mg total) into the skin every 12 (twelve) hours.  6 Syringe  0  . glucose blood (ACCU-CHEK AVIVA) test strip Please check blood sugar 3 to 4 times daily. diag code 250.0. Insulin dependent  100 each  12  . insulin NPH-regular Human (NOVOLIN 70/30) (70-30) 100 UNIT/ML injection Inject 45 Units into the skin 2 (two) times daily with a meal.  20 mL  11  . Lancet Devices (ACCU-CHEK SOFTCLIX) lancets Use to check blood sugars 3 to 4 times daily. Dx code: 250.00. Insulin dependent.  100 each  0  . loperamide (IMODIUM) 2 MG capsule Take 1 capsule (2 mg total) by mouth 2 (two) times daily as needed for diarrhea or loose stools.  30 capsule  0  . losartan-hydrochlorothiazide (HYZAAR) 100-25 MG per tablet Take 1 tablet by mouth daily.      . metFORMIN (GLUCOPHAGE) 500 MG tablet Take 1 tablet (500 mg total) by mouth 2 (two) times daily with a meal.  30 tablet  1  .  omeprazole (PRILOSEC) 20 MG capsule Take 1 capsule (20 mg total) by mouth daily.  90 capsule  3  . pravastatin (PRAVACHOL) 40 MG tablet Take 40 mg by mouth every evening.       . predniSONE (DELTASONE) 20 MG tablet Take 2 tablets daily (76m) until 6/23, then 1.5 tablet daily (350m from 6/24 to 06/26/14.  54 tablet  0  . predniSONE (DELTASONE) 5 MG tablet Take one tablet daily on 6/8 and 6/9, then one tablet daily from 6/24 to 7/7.  16 tablet  0  . warfarin (COUMADIN) 5 MG tablet Take 2.5 tablets (12.5 mg total) by mouth one time only at 6 PM.  90 tablet  3   No current facility-administered medications for this visit.   Family History  Problem Relation Age of Onset  . Adopted: Yes  . Hypertension Mother    History   Social History  . Marital Status: Single    Spouse Name: N/A    Number of Children: N/A  . Years of Education: N/A   Social History Main Topics  . Smoking status: Current Every Day Smoker -- 0.05 packs/day for 10 years    Types: Cigarettes  . Smokeless tobacco: Never Used     Comment: Wants patches.  . Alcohol Use: No  . Drug Use: No  . Sexual Activity: Not Currently   Other Topics Concern  . None   Social History Narrative   Previously divorced now engaged with her partner of 4 years.  Has 6 grown children with 21 grandchildren.  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  [place]oved to [place] August 2013 and was transiently homeless until first part of October 2013.     Review of Systems: Constitutional: Denies fever, chills, diaphoresis, appetite change and fatigue.  Respiratory: Denies SOB, DOE, cough, chest tightness, and wheezing.  Cardiovascular: No chest pain, palpitations and leg swelling.  Gastrointestinal: No abdominal pain, nausea, vomiting, bloody stools Genitourinary: No dysuria, frequency, hematuria, or flank pain.  Musculoskeletal:  chronic back pain, and pain in her extremities associated with weakness.   Psych: Reports significant improvement in her depression symptoms with Celexa.  Objective:  Physical Exam: Filed Vitals:   05/30/14 1429  BP: 124/78  Pulse: 106  Temp: 99.3 F (37.4 C)  TempSrc: Oral  Height: 5' 9" (1.753 m)  Weight: 262 lb 14.4 oz (119.251 kg)  SpO2: 97%   General: Well nourished. No acute distress. Husband/boyfriend in exam room.  Lungs: CTA bilaterally. Heart: RRR; no extra sounds or murmurs  Abdomen: Non-distended, normal BS, soft, nontender; no hepatosplenomegaly  Extremities: No pedal edema. No joint swelling or tenderness.  Left lower extremity:  Unilateral mild ankle edema. No tenderness, erythema, or other signs of infection or inflammation. Neurologic: Alert and oriented x3. No obvious neurologic deficits.  Assessment & Plan:  I have discussed my assessment and plan  with Dr. Stann Mainland as detailed under problem based charting.

## 2014-05-30 NOTE — Assessment & Plan Note (Signed)
She was discharged 3 days ago from the hospital with 6 shots of Lovenox, which has been compliant with. Her INR today is 3.1. She does not have specific complaint to suggest pulmonary embolism. She still has 2 shots of Lovenox at home. Plan. -Stop Lovenox. -Continue with Coumadin with her usual dose. -Check INR within one week. I encouraged patient to make appointment with Dr. Elie Confer.

## 2014-05-30 NOTE — Assessment & Plan Note (Signed)
Discharged last week with steroid-induced hyperglycemia. Patient reports that her blood sugars have been well-controlled with her current regimen of insulin despite continuing with Prednisone. She also was recently started on metformin. She doesn't have a glucose meter today. Her CBG in the clinic is 140.  Plan. - Will continue with NPH/NovoLog 70/30 at 45 units twice a day - Discussed with the patient about symptoms of hypoglycemia and how to treat at home. - Continue with metformin 500 mg twice a day. This can be escalated after one month. - I have encouraged the patient to bring her glucose meter on a revisit in addition to her medication bottles. - Followup within one month.

## 2014-05-31 DIAGNOSIS — Z86711 Personal history of pulmonary embolism: Secondary | ICD-10-CM | POA: Diagnosis not present

## 2014-05-31 DIAGNOSIS — IMO0001 Reserved for inherently not codable concepts without codable children: Secondary | ICD-10-CM | POA: Diagnosis not present

## 2014-05-31 DIAGNOSIS — Z794 Long term (current) use of insulin: Secondary | ICD-10-CM | POA: Diagnosis not present

## 2014-05-31 DIAGNOSIS — Z7901 Long term (current) use of anticoagulants: Secondary | ICD-10-CM | POA: Diagnosis not present

## 2014-05-31 DIAGNOSIS — I10 Essential (primary) hypertension: Secondary | ICD-10-CM | POA: Diagnosis not present

## 2014-05-31 DIAGNOSIS — M316 Other giant cell arteritis: Secondary | ICD-10-CM | POA: Diagnosis not present

## 2014-06-01 ENCOUNTER — Telehealth: Payer: Self-pay | Admitting: *Deleted

## 2014-06-01 LAB — CULTURE, BLOOD (ROUTINE X 2)
CULTURE: NO GROWTH
Culture: NO GROWTH

## 2014-06-01 NOTE — Telephone Encounter (Signed)
Joy Butler with [facility] called - saw pt today  and has noticed changes with thought process since end 2014. Appt made 06/04/14 10:45AM Dr Kyung Rudd and to see Dr Elie Confer 06/04/14 10AM. Called pt and aware of both appts.Hilda Blades Gennette Shadix RN 06/01/14 3:30PM

## 2014-06-01 NOTE — Telephone Encounter (Signed)
Was unable to reach patient prior to hospital follow up visit. Called and left message today with CDE phone number for questions or concerns.

## 2014-06-04 ENCOUNTER — Encounter: Payer: Self-pay | Admitting: Vascular Surgery

## 2014-06-04 ENCOUNTER — Ambulatory Visit: Payer: Medicare Other

## 2014-06-04 ENCOUNTER — Ambulatory Visit: Payer: Medicare Other | Admitting: Internal Medicine

## 2014-06-05 ENCOUNTER — Encounter: Payer: Medicare Other | Admitting: Vascular Surgery

## 2014-06-06 ENCOUNTER — Ambulatory Visit: Payer: Medicare Other | Admitting: Internal Medicine

## 2014-06-06 ENCOUNTER — Encounter: Payer: Self-pay | Admitting: Internal Medicine

## 2014-06-06 DIAGNOSIS — Z794 Long term (current) use of insulin: Secondary | ICD-10-CM | POA: Diagnosis not present

## 2014-06-06 DIAGNOSIS — Z7901 Long term (current) use of anticoagulants: Secondary | ICD-10-CM | POA: Diagnosis not present

## 2014-06-06 DIAGNOSIS — M316 Other giant cell arteritis: Secondary | ICD-10-CM | POA: Diagnosis not present

## 2014-06-06 DIAGNOSIS — Z86711 Personal history of pulmonary embolism: Secondary | ICD-10-CM | POA: Diagnosis not present

## 2014-06-06 DIAGNOSIS — IMO0001 Reserved for inherently not codable concepts without codable children: Secondary | ICD-10-CM | POA: Diagnosis not present

## 2014-06-06 DIAGNOSIS — I10 Essential (primary) hypertension: Secondary | ICD-10-CM | POA: Diagnosis not present

## 2014-06-11 ENCOUNTER — Encounter: Payer: Self-pay | Admitting: Vascular Surgery

## 2014-06-12 ENCOUNTER — Encounter: Payer: Medicare Other | Admitting: Vascular Surgery

## 2014-06-13 ENCOUNTER — Telehealth: Payer: Self-pay | Admitting: *Deleted

## 2014-06-13 NOTE — Telephone Encounter (Addendum)
Call from Mariann Laster, Nurse care manager with [facility] - # [phone] Nurse saw pt today to fill pill box.  Nurse reports Pt missed appointment with eye surgeon yesterday, do to transportation.    Also Vitals - Heart rate 103, BP 114/80, pulse Ox 99% rest 20.  CBG at 1:00 296 Swelling to rt jaw area for 3 days, sore to touch.  Nurse is concerned with elevated HR and jaw swelling.  Last OV on 6/10 and Heart Rate was 106. Pt did have a biopsy of Temporal Artery on 5/20 by dr Curt Jews.    Area is not warm, no discharge noted, no redness  just swelling.   I called Dr Luther Parody office and talked with Ulis Rias.  She will f/u with pt about the swelling to Biopsy area.  Nurse from Dr Luther Parody office returned call after talking with pt and states pt did not show up from last 2 appointments at there office.  After talking with pt she does not feel this is related to past surgery. Pt has a hard time coming to clinic.  Pt states she can come in for office visit this Friday 6/26.  Scheduled.

## 2014-06-13 NOTE — Telephone Encounter (Signed)
She can come in on Friday unless if symptoms get worse. It does not sound like an infection.

## 2014-06-13 NOTE — Telephone Encounter (Signed)
I called [name] per request from Dr. Graciella Freer' s nurse, Edd Fabian. Patient reported to me that her face (right upper cheek and eye area) had been swollen x 2 weeks; no pain, redness, erythema, or drainage from incision. She is able to eat without any difficulty. She has been afebrile and reports no vision problems.  This is the same side that Dr. Donnetta Hutching did a right temporal artery biopsy on 05-09-14. The patient has no showed X 2 for her postop appts on 06-06-14 & 06-12-14. She reports no falls or injuries.  I called Edd Fabian back and told her that I do not think this swelling has anything to do with her surgery but we are limited in this decision because of patient not keeping her appts. Edd Fabian will consult with Dr. Graciella Freer and call us back if he wants [place] to see the patient with our nurse practioner for evaluation.  Leota Jacobsen, RN

## 2014-06-14 ENCOUNTER — Ambulatory Visit (INDEPENDENT_AMBULATORY_CARE_PROVIDER_SITE_OTHER): Payer: Medicare Other | Admitting: Internal Medicine

## 2014-06-14 ENCOUNTER — Other Ambulatory Visit: Payer: Self-pay | Admitting: Internal Medicine

## 2014-06-14 ENCOUNTER — Ambulatory Visit: Payer: Medicare Other

## 2014-06-14 ENCOUNTER — Encounter: Payer: Self-pay | Admitting: Internal Medicine

## 2014-06-14 VITALS — BP 109/73 | HR 112 | Temp 97.7°F | Wt 257.0 lb

## 2014-06-14 DIAGNOSIS — E119 Type 2 diabetes mellitus without complications: Secondary | ICD-10-CM

## 2014-06-14 DIAGNOSIS — M316 Other giant cell arteritis: Secondary | ICD-10-CM | POA: Diagnosis not present

## 2014-06-14 DIAGNOSIS — I10 Essential (primary) hypertension: Secondary | ICD-10-CM | POA: Diagnosis not present

## 2014-06-14 DIAGNOSIS — M7989 Other specified soft tissue disorders: Secondary | ICD-10-CM

## 2014-06-14 DIAGNOSIS — E1165 Type 2 diabetes mellitus with hyperglycemia: Secondary | ICD-10-CM

## 2014-06-14 LAB — GLUCOSE, CAPILLARY: Glucose-Capillary: 106 mg/dL — ABNORMAL HIGH (ref 70–99)

## 2014-06-14 MED ORDER — INSULIN NPH ISOPHANE & REGULAR (70-30) 100 UNIT/ML ~~LOC~~ SUSP: SUBCUTANEOUS | Status: DC

## 2014-06-14 NOTE — Assessment & Plan Note (Signed)
Well controlled.  Plans: Continue current management.

## 2014-06-14 NOTE — Assessment & Plan Note (Signed)
I suspect patient symptoms and physical examination findings such as lethargy, puffiness of the face, acne over the face, leg swelling, recent hyperglycemia are all side effects from prednisone therapy. No signs suggestive of herpes zoster or other infectious etiology that warrant further work up. Discussed with the attending regarding further management and plan.  Plans: Continue to taper Prednisone as was planned before and follow up with PCP as scheduled. Reducing Insulin as we are tapering prednisone and also as her CBG's in 80's. See my A/P under DM.

## 2014-06-14 NOTE — Assessment & Plan Note (Signed)
Again noted on physical exam. No clinical signs suggestive of DVT and doppler studies negative during 05/25/14 to 05/27/14 hospitalization.  Plans: Continue coumadin.

## 2014-06-14 NOTE — Progress Notes (Signed)
Subjective:   Patient ID: Joy Butler female   DOB: [birth year] 63 y.o.   MRN: [id number]  HPI: [name] is a 63 y.o. 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  I saw [name] on 04/05/14 for right temporal headaches and was diagnosed with probable Temporal arteritis based on American college of rheumatology criteria. Patient was started on Prednisone therapy empirically and was scheduled for a temporal artery biopsy. Patient underwent Right temporal artery biopsy on 05/09/14 which was negative for any signs suggestive of temporal arteritis. It was decided to gradually taper the prednisone over a period of couple of months and then stop. Patient reports that her headache did improve upon starting Prednisone therapy.   Patient was hospitalized for elevated blood sugars from 05/25/14 to 05/27/14 and was subsequently discharged on increased insulin. Patient reports that she is currently taking Insulin 70/30 45 units twice daily and that she has had some values less than 100, with the lowest one being 86.   Patient complains of facial swelling, right cheek "rash", left cheek "rash", "rash" on the chin, swelling of legs, lack of energy, weight gain of about 15 pounds. Patient reports that all these symptoms started about two weeks ago.   She denies any fever, chills, body pains, SOB, chest pain, palpitations, sweating, dysuria, nausea, vomiting. She denies any other complaints.   Past Medical History  Diagnosis Date  . Diabetes mellitus     diagnosed in 2000.    Marland Kitchen Hypertension   . Pulmonary embolism     2011 treated at [facility] in [place].  Was on Coumadin for over a year.  no known family history  . UTI (urinary tract infection)   . High cholesterol   .  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 Depression   . Neuropathy     feet  . Pneumonia   . Kidney stones   . Headache(784.0)     migraines ( 12 years ago)   Current Outpatient Prescriptions  Medication Sig Dispense Refill  . acetaminophen (TYLENOL) 500 MG tablet Take 1,000 mg by mouth every 6 (six) hours as needed for moderate pain.      Marland Kitchen amLODipine (NORVASC) 10 MG tablet Take 1 tablet (10 mg total) by mouth daily.  90 tablet  3  . Blood Glucose Monitoring Suppl (ACCU-CHEK AVIVA PLUS) W/DEVICE KIT Please check blood sugar 3 to 4 times daily. diag code 250.0. Insulin dependent  1 kit  0  . Blood Pressure KIT 1 Units by Does not apply route daily.  1 each  0  . citalopram (CELEXA) 20 MG tablet Take 20 mg by mouth daily.      . clotrimazole (LOTRIMIN) 1 % cream Apply 1 application topically 2 (two) times daily.  30 g  0  . EASY TOUCH INSULIN SYRINGE 31G X 5/16" 1 ML MISC USE AS DIRECTED TWICE DAILY  100 each  6  . glucose blood (ACCU-CHEK AVIVA) test strip Please check blood sugar 3 to 4 times daily. diag code 250.0. Insulin dependent  100 each  12  . insulin NPH-regular Human (NOVOLIN 70/30) (70-30) 100 UNIT/ML injection Inject 40 units subcutaneously twice daily.  20 mL  11  . Lancet Devices (ACCU-CHEK SOFTCLIX) lancets Use  to check blood sugars 3 to 4 times daily. Dx code: 250.00. Insulin dependent.  100 each  0  . loperamide (IMODIUM) 2 MG capsule Take 1 capsule (2 mg total) by mouth 2 (two) times daily as needed for diarrhea or loose stools.  30 capsule  0  . losartan-hydrochlorothiazide (HYZAAR) 100-25 MG per tablet Take 1 tablet by mouth daily.      . metFORMIN (GLUCOPHAGE) 500 MG tablet Take 1 tablet (500 mg total) by mouth 2 (two) times daily with a meal.  30 tablet  1  . omeprazole (PRILOSEC) 20 MG capsule Take 1 capsule (20 mg total) by mouth daily.  90 capsule  3  . pravastatin (PRAVACHOL) 40 MG tablet Take 40 mg by mouth every evening.       . predniSONE (DELTASONE) 20 MG  tablet Take 2 tablets daily (39m) until 6/23, then 1.5 tablet daily (317m from 6/24 to 06/26/14.  54 tablet  0  . predniSONE (DELTASONE) 5 MG tablet Take one tablet daily on 6/8 and 6/9, then one tablet daily from 6/24 to 7/7.  16 tablet  0  . warfarin (COUMADIN) 5 MG tablet Take 2.5-3 tablets (12.5-15 mg total) by mouth daily. Patient takes 12.5 mg on all days except on Monday and Thursday patient takes 1537m (patient uses 5mg33mrength tablets)  30 tablet  1   No current facility-administered medications for this visit.   Family History  Problem Relation Age of Onset  . Adopted: Yes  . Hypertension Mother    History   Social History  . Marital Status: Single    Spouse Name: N/A    Number of Children: N/A  . Years of Education: N/A   Social History Main Topics  . Smoking status: Current Every Day Smoker -- 0.50 packs/day for 10 years    Types: Cigarettes  . Smokeless tobacco: Never Used  . Alcohol Use: No  . Drug Use: No  . Sexual Activity: None   Other Topics Concern  . None   Social History Narrative   Previously divorced now engaged with her partner of 4 years.  Has 6 grown children with 21 grandchildren.  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  [place] and was transiently homeless until first part of October 2013.     Review of Systems: Pertinent items are noted in HPI. Objective:  Physical Exam: Filed Vitals:   06/14/14 1413  BP: 109/73  Pulse: 112  Temp: 97.7 F (36.5 C)  TempSrc: Oral  Weight: 257 lb (116.574 kg)  SpO2: 94%    Constitutional: Vital signs reviewed.  Patient is a morbidly obese african american women in her sixties, sitting in a wheel chair, in noacute distress. Patient is co-operative with exam.  Head: Mild puffiness of the face noticed. Face appears symmetrical on both left and right sides. Mild acne noticed over the right and left cheek over the mandibular ramus area and  acne noted over the chin. No TTP over the location of the acne. The right temporal artery biopsy location healed appropriately with scar formation and there is no induration, TTP or swelling noted at the surgical incision site.  Cardiovascular: RRR, S1 normal, S2 normal, no MRG Pulmonary/Chest: normal respiratory effort, CTAB, no wheezes, rales, or rhonchi  Extremities: 1+ pitting noted in left lower extremity. No calf tenderness noted.  Neurological: A&O x3 Psychiatry: Appears tired and lethargic.  Assessment & Plan:

## 2014-06-14 NOTE — Patient Instructions (Signed)
Take Prednisone 20 mg, one and a half tablet once daily. Take Prednisone 5 mg, one tablet once daily. Take Insulin 70/30 40 units subcutaneously twice daily. Check your blood sugars daily as was instructed before and bring your blood sugar log to your next office visit. If you notice any blood sugars less than 100 or greater than 400, please call the clinic to inform us.   Prednisone tablets What is this medicine? PREDNISONE (PRED ni sone) is a corticosteroid. It is commonly used to treat inflammation of the skin, joints, lungs, and other organs. Common conditions treated include asthma, allergies, and arthritis. It is also used for other conditions, such as blood disorders and diseases of the adrenal glands. This medicine may be used for other purposes; ask your health care provider or pharmacist if you have questions. COMMON BRAND NAME(S): Deltasone, Predone, Sterapred, Sterapred DS What should I tell my health care provider before I take this medicine? They need to know if you have any of these conditions: -Cushing's syndrome -diabetes -glaucoma -heart disease -high blood pressure -infection (especially a virus infection such as chickenpox, cold sores, or herpes) -kidney disease -liver disease -mental illness -myasthenia gravis -osteoporosis -seizures -stomach or intestine problems -thyroid disease -an unusual or allergic reaction to lactose, prednisone, other medicines, foods, dyes, or preservatives -pregnant or trying to get pregnant -breast-feeding How should I use this medicine? Take this medicine by mouth with a glass of water. Follow the directions on the prescription label. Take this medicine with food. If you are taking this medicine once a day, take it in the morning. Do not take more medicine than you are told to take. Do not suddenly stop taking your medicine because you may develop a severe reaction. Your doctor will tell you how much medicine to take. If your doctor  wants you to stop the medicine, the dose may be slowly lowered over time to avoid any side effects. Talk to your pediatrician regarding the use of this medicine in children. Special care may be needed. Overdosage: If you think you have taken too much of this medicine contact a poison control center or emergency room at once. NOTE: This medicine is only for you. Do not share this medicine with others. What if I miss a dose? If you miss a dose, take it as soon as you can. If it is almost time for your next dose, talk to your doctor or health care professional. You may need to miss a dose or take an extra dose. Do not take double or extra doses without advice. What may interact with this medicine? Do not take this medicine with any of the following medications: -metyrapone -mifepristone This medicine may also interact with the following medications: -aminoglutethimide -amphotericin B -aspirin and aspirin-like medicines -barbiturates -certain medicines for diabetes, like glipizide or glyburide -cholestyramine -cholinesterase inhibitors -cyclosporine -digoxin -diuretics -ephedrine -female hormones, like estrogens and birth control pills -isoniazid -ketoconazole -NSAIDS, medicines for pain and inflammation, like ibuprofen or naproxen -phenytoin -rifampin -toxoids -vaccines -warfarin This list may not describe all possible interactions. Give your health care provider a list of all the medicines, herbs, non-prescription drugs, or dietary supplements you use. Also tell them if you smoke, drink alcohol, or use illegal drugs. Some items may interact with your medicine. What should I watch for while using this medicine? Visit your doctor or health care professional for regular checks on your progress. If you are taking this medicine over a prolonged period, carry an identification card with your  name and address, the type and dose of your medicine, and your doctor's name and address. This  medicine may increase your risk of getting an infection. Tell your doctor or health care professional if you are around anyone with measles or chickenpox, or if you develop sores or blisters that do not heal properly. If you are going to have surgery, tell your doctor or health care professional that you have taken this medicine within the last twelve months. Ask your doctor or health care professional about your diet. You may need to lower the amount of salt you eat. This medicine may affect blood sugar levels. If you have diabetes, check with your doctor or health care professional before you change your diet or the dose of your diabetic medicine. What side effects may I notice from receiving this medicine? Side effects that you should report to your doctor or health care professional as soon as possible: -allergic reactions like skin rash, itching or hives, swelling of the face, lips, or tongue -changes in emotions or moods -changes in vision -depressed mood -eye pain -fever or chills, cough, sore throat, pain or difficulty passing urine -increased thirst -swelling of ankles, feet Side effects that usually do not require medical attention (report to your doctor or health care professional if they continue or are bothersome): -confusion, excitement, restlessness -headache -nausea, vomiting -skin problems, acne, thin and shiny skin -trouble sleeping -weight gain This list may not describe all possible side effects. Call your doctor for medical advice about side effects. You may report side effects to FDA at 1-800-FDA-1088. Where should I keep my medicine? Keep out of the reach of children. Store at room temperature between 15 and 30 degrees C (59 and 86 degrees F). Protect from light. Keep container tightly closed. Throw away any unused medicine after the expiration date.  NOTE: This sheet is a summary. It may not cover all possible information. If you have questions about this medicine,  talk to your doctor, pharmacist, or health care provider.  2015, Elsevier/Gold Standard. (2011-07-23 10:57:14)

## 2014-06-14 NOTE — Progress Notes (Signed)
I saw and evaluated the patient.  I personally confirmed the key portions of the history and exam documented by Dr. Eyvonne Mechanic and I reviewed pertinent patient test results.  The assessment, diagnosis, and plan were formulated together and I agree with the documentation in the resident's note.  We advised patient of the need to follow her blood sugars closely as the prednisone is tapered since her insulin requirement will decrease, and to report any low blood sugars right away.

## 2014-06-14 NOTE — Assessment & Plan Note (Signed)
Recently hospitalized for elevated blood sugars, (from 05/25/14 to 05/27/14) Patient didn't bring her glucometer to office today. Patient reports a low value of 86 and another value less than 100. Expect lower blood sugars with tapering of Prednisone from today. Discussed with Dr. Marinda Elk.  Plans: Reduce 70/30 to 40 units BID. Encouraged to check her blood sugars and to call the clinic for values less than 100 or greater 300. Follow up with PCP on 7/15 as scheduled.

## 2014-06-15 ENCOUNTER — Ambulatory Visit: Payer: Medicare Other | Admitting: Internal Medicine

## 2014-06-20 ENCOUNTER — Ambulatory Visit: Payer: Medicare Other | Admitting: Internal Medicine

## 2014-06-20 ENCOUNTER — Encounter: Payer: Medicare Other | Admitting: Internal Medicine

## 2014-06-28 ENCOUNTER — Other Ambulatory Visit: Payer: Self-pay | Admitting: Internal Medicine

## 2014-06-28 ENCOUNTER — Telehealth: Payer: Self-pay | Admitting: *Deleted

## 2014-06-28 MED ORDER — PREDNISONE (PAK) 10 MG PO TABS: ORAL_TABLET | ORAL | Status: DC

## 2014-06-29 DIAGNOSIS — Z86711 Personal history of pulmonary embolism: Secondary | ICD-10-CM | POA: Diagnosis not present

## 2014-06-29 DIAGNOSIS — M316 Other giant cell arteritis: Secondary | ICD-10-CM | POA: Diagnosis not present

## 2014-06-29 DIAGNOSIS — Z7901 Long term (current) use of anticoagulants: Secondary | ICD-10-CM | POA: Diagnosis not present

## 2014-06-29 DIAGNOSIS — I10 Essential (primary) hypertension: Secondary | ICD-10-CM | POA: Diagnosis not present

## 2014-06-29 DIAGNOSIS — IMO0001 Reserved for inherently not codable concepts without codable children: Secondary | ICD-10-CM | POA: Diagnosis not present

## 2014-06-29 DIAGNOSIS — Z794 Long term (current) use of insulin: Secondary | ICD-10-CM | POA: Diagnosis not present

## 2014-06-29 NOTE — Telephone Encounter (Signed)
[facility] pharmacist was here yesterday asking about Prednisone rx which was written by Dr Lynnae January.

## 2014-07-02 ENCOUNTER — Telehealth: Payer: Self-pay | Admitting: *Deleted

## 2014-07-02 ENCOUNTER — Other Ambulatory Visit: Payer: Self-pay | Admitting: *Deleted

## 2014-07-02 MED ORDER — ACCU-CHEK AVIVA PLUS W/DEVICE KIT: PACK | Status: DC

## 2014-07-02 NOTE — Telephone Encounter (Signed)
Pt aware.

## 2014-07-02 NOTE — Telephone Encounter (Signed)
[name] called from [facility] [phone] called to let clinic be aware pt takes 2 ibuprofen 800mg  for back pain. I called pt to discuss back pain and pt states she has appt  07/04/14 in clinic. Will call if pain gets worse. Hilda Blades Michelena Culmer RN 07/02/14 2:30PM

## 2014-07-04 ENCOUNTER — Encounter: Payer: Medicare Other | Admitting: Internal Medicine

## 2014-07-11 ENCOUNTER — Telehealth: Payer: Self-pay | Admitting: Dietician

## 2014-07-11 ENCOUNTER — Other Ambulatory Visit: Payer: Self-pay | Admitting: Internal Medicine

## 2014-07-11 ENCOUNTER — Telehealth: Payer: Self-pay | Admitting: *Deleted

## 2014-07-11 ENCOUNTER — Ambulatory Visit (INDEPENDENT_AMBULATORY_CARE_PROVIDER_SITE_OTHER): Payer: Medicare Other | Admitting: Internal Medicine

## 2014-07-11 ENCOUNTER — Encounter: Payer: Self-pay | Admitting: Internal Medicine

## 2014-07-11 VITALS — BP 158/89 | HR 108 | Temp 98.2°F | Resp 20 | Ht 69.0 in | Wt 264.5 lb

## 2014-07-11 DIAGNOSIS — M549 Dorsalgia, unspecified: Secondary | ICD-10-CM

## 2014-07-11 DIAGNOSIS — M316 Other giant cell arteritis: Secondary | ICD-10-CM

## 2014-07-11 DIAGNOSIS — E119 Type 2 diabetes mellitus without complications: Secondary | ICD-10-CM

## 2014-07-11 DIAGNOSIS — W19XXXD Unspecified fall, subsequent encounter: Secondary | ICD-10-CM

## 2014-07-11 DIAGNOSIS — I2699 Other pulmonary embolism without acute cor pulmonale: Secondary | ICD-10-CM

## 2014-07-11 DIAGNOSIS — Z7901 Long term (current) use of anticoagulants: Secondary | ICD-10-CM | POA: Diagnosis not present

## 2014-07-11 DIAGNOSIS — I10 Essential (primary) hypertension: Secondary | ICD-10-CM | POA: Diagnosis not present

## 2014-07-11 DIAGNOSIS — IMO0001 Reserved for inherently not codable concepts without codable children: Secondary | ICD-10-CM | POA: Diagnosis not present

## 2014-07-11 DIAGNOSIS — G8929 Other chronic pain: Secondary | ICD-10-CM

## 2014-07-11 DIAGNOSIS — W19XXXA Unspecified fall, initial encounter: Secondary | ICD-10-CM

## 2014-07-11 DIAGNOSIS — Z794 Long term (current) use of insulin: Secondary | ICD-10-CM | POA: Diagnosis not present

## 2014-07-11 DIAGNOSIS — Z5189 Encounter for other specified aftercare: Secondary | ICD-10-CM

## 2014-07-11 DIAGNOSIS — E1165 Type 2 diabetes mellitus with hyperglycemia: Secondary | ICD-10-CM

## 2014-07-11 DIAGNOSIS — Z86711 Personal history of pulmonary embolism: Secondary | ICD-10-CM | POA: Diagnosis not present

## 2014-07-11 LAB — GLUCOSE, CAPILLARY: Glucose-Capillary: 589 mg/dL (ref 70–99)

## 2014-07-11 MED ORDER — TRAMADOL HCL 50 MG PO TABS: 50.0000 mg | ORAL_TABLET | Freq: Three times a day (TID) | ORAL | Status: DC | PRN

## 2014-07-11 NOTE — Telephone Encounter (Signed)
[facility] says they need her Medicare card to run the recent prescription for a blood glucose  meter through her insurance. Left patient this message on her voicemail.

## 2014-07-11 NOTE — Telephone Encounter (Signed)
Joy Butler with [facility] [phone] - needs new meter for CBG. Meter was sent in to pharmacy 07/02/14 and confirmed by pharmacy. Joy Butler states she will check on meter. [facility] was tere for diabetic teaching - would like to extend for 2 more weeks. VO given to extend for 2 more weeks. Marino Rogerson 07/11/14 3PM

## 2014-07-11 NOTE — Patient Instructions (Signed)
General Instructions: Please start taking Tramadol 50 mg three times a day as needed for back pain  I will refer you to outpatient physical therapy to determine what equipment you need  I will see if we can get you a meter today  Please take your insulin as usual and take plenty of water. Please come back in one week   Treatment Goals:  Goals (1 Years of Data) as of 07/11/14         As of Today 06/14/14 05/30/14 05/27/14 05/27/14     Blood Pressure    . Blood Pressure < 120/80  158/89 109/73 124/78 139/64 125/75     Result Component    . HEMOGLOBIN A1C < 7.0          . LDL CALC < 70            Progress Toward Treatment Goals:  Treatment Goal 07/11/2014  Hemoglobin A1C unchanged  Blood pressure unchanged  Stop smoking smoking the same amount  Prevent falls deteriorated    Self Care Goals & Plans:  Self Care Goal 07/11/2014  Manage my medications take my medicines as prescribed; bring my medications to every visit; refill my medications on time  Monitor my health keep track of my blood glucose; bring my glucose meter and log to each visit  Eat healthy foods eat baked foods instead of fried foods; eat foods that are low in salt  Be physically active -  Stop smoking cut down the number of cigarettes smoked  Prevent falls -    Home Blood Glucose Monitoring 07/11/2014  Check my blood sugar 3 times a day  When to check my blood sugar before breakfast; before lunch; before dinner     Care Management & Community Referrals:  Referral 02/17/2013  Referrals made for care management support [facility] case management program

## 2014-07-11 NOTE — Progress Notes (Signed)
I discussed this visit with Dr. Kazibwe and agree with his plans and notes. 

## 2014-07-12 NOTE — Assessment & Plan Note (Signed)
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 She uses a cane for ambulation. She request for a recliner chair. Plan. -I will refer to physical therapy for evaluation for equipment needs.

## 2014-07-12 NOTE — Assessment & Plan Note (Signed)
She cont to have improvement on steroids. She reports compliance.  Plan. - currently on prednisone 30 mg daily for 2 weeks (from 06/29/2014 to 07/13/2014) - prednisone 20 mg daily 7/25 - 07/25/2014 - then prednisone 15 mg  8/6 to 08/09/2014  - then prednisone 10 mg 08/10/2014 to the end of February 2016.  - we can start tapering in 1 mg decrements until she is completely off the steroids. -she currently has the [facility] team follow up with her to make sure she is compliant with this quite complex regimen. I encouraged her to keep in close touch with her home health services.  -will titrate he insulin as her glycemic control will likely be affected by these changes in her prednisone regimen

## 2014-07-12 NOTE — Progress Notes (Signed)
Patient ID: Joy Butler, female   DOB: Nov 24, 1951, 63 y.o.   MRN: [id number]   Subjective:   HPI: [name] is a 63 y.o. woman with past medical history of uncontrolled diabetes, hypertension, history of pulmonary embolism on chronic Coumadin therapy, presents for a routine clinic visit.   She reports that she tripped and fell while she was getting off the bus to come to the clinic today. She sustained a bruise on her left knee. No suspicion of major injury like a fracture. Patient has a history of recurrent falls due to severe peripheral neuropathy. She uses a cane for mobility. She also tells me that she has significant difficulties getting up from a seated position. She requests for a recliner chair. No other complaints today.  DM2, uncontrolled -  Lab Results  Component Value Date   HGBA1C 13.3 05/16/2014   Patient checking blood sugars 0 times daily since her meter broke 1 month ago. She was instructed to check three times a day, before breakfast and before lunch and before supper. Currently taking  metformin 500 mg twice a day and insulin NPH-regular 70/30 40 units twice a day . Patient misses doses 0 x per week on average.  0 hypoglycemic episodes since last visit. Denies assisted hypoglycemia or recently hospitalizations for either hyper or hypoglycemia. Denies polyuria, polydipsia, nausea, vomiting, but her blood sugar today in the clinic is 588.  In regards to diabetic complications:  Microvascular complications: Confirms: nephropathy ;    Important diabetic medications: Is patient on aspirin? No Is patient on a statin? No Is patient on an ACE-I/ ARB? Yes  ROS: Constitutional: Denies fever, chills, diaphoresis, appetite change and fatigue.  Respiratory: Denies SOB, DOE, cough, chest tightness, and wheezing. Denies chest pain. CVS: No chest pain or palpitations. Reports bilateral LE swelling  GI: No abdominal pain, nausea, vomiting. She continues to have diarrhea.  It started before metformin was initiated. GU: No dysuria, frequency, hematuria, or flank pain.  MSK: chronic back pain. No myalgias, joint swelling, arthralgias  Psych: No depression symptoms. No SI or SA.   Past Medical History  Diagnosis Date  . Diabetes mellitus     diagnosed in 2000.    Marland Kitchen Hypertension   . Pulmonary embolism     2011 treated at [facility] in [place].  Was on Coumadin for over a year.  no known family history  . UTI (urinary tract infection)   . High cholesterol   . 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 Depression   . Neuropathy     feet  . Pneumonia   . Kidney stones   . Headache(784.0)     migraines ( 12 years ago)   Current Outpatient Prescriptions  Medication Sig Dispense Refill  . acetaminophen (TYLENOL) 500 MG tablet Take 1,000 mg by mouth every 6 (six) hours as needed for moderate pain.      Marland Kitchen amLODipine (NORVASC) 10 MG tablet Take 1 tablet (10 mg total) by mouth daily.  90 tablet  3  . Blood Glucose Monitoring Suppl (ACCU-CHEK AVIVA PLUS) W/DEVICE KIT Please check blood sugar 3 to 4 times daily. diag code 250.0. Insulin dependent  1 kit  0  . Blood Pressure KIT 1 Units by Does not apply route daily.  1 each  0  . citalopram (CELEXA) 20 MG tablet Take 20 mg by mouth daily.      . clotrimazole (LOTRIMIN) 1 % cream Apply 1 application  topically 2 (two) times daily.  30 g  0  . EASY TOUCH INSULIN SYRINGE 31G X 5/16" 1 ML MISC USE AS DIRECTED TWICE DAILY  100 each  6  . glucose blood (ACCU-CHEK AVIVA) test strip Please check blood sugar 3 to 4 times daily. diag code 250.0. Insulin dependent  100 each  12  . insulin NPH-regular Human (NOVOLIN 70/30) (70-30) 100 UNIT/ML injection Inject 40 units subcutaneously twice daily.  20 mL  11  . Lancet Devices (ACCU-CHEK SOFTCLIX) lancets Use to check blood sugars 3 to 4 times daily. Dx code: 250.00. Insulin dependent.  100 each  0  . loperamide (IMODIUM) 2 MG capsule Take 1 capsule (2 mg total) by mouth 2  (two) times daily as needed for diarrhea or loose stools.  30 capsule  0  . losartan-hydrochlorothiazide (HYZAAR) 100-25 MG per tablet Take 1 tablet by mouth daily.      . metFORMIN (GLUCOPHAGE) 500 MG tablet Take 1 tablet (500 mg total) by mouth 2 (two) times daily with a meal.  60 tablet  2  . omeprazole (PRILOSEC) 20 MG capsule Take 1 capsule (20 mg total) by mouth daily.  90 capsule  3  . predniSONE (STERAPRED UNI-PAK) 10 MG tablet Take 3 pils daily starting 06/28/2014 until 07/11/14. Then start 2.5 pills daily for 2 weeks. Call [phone] for further instructions on 07/25/14.  90 tablet  0  . warfarin (COUMADIN) 5 MG tablet Take 2.5-3 tablets (12.5-15 mg total) by mouth daily. Patient takes 12.5 mg on all days except on Monday and Thursday patient takes 33m   (patient uses 577mstrength tablets)  30 tablet  1  . traMADol (ULTRAM) 50 MG tablet Take 1 tablet (50 mg total) by mouth every 8 (eight) hours as needed.  45 tablet  0   No current facility-administered medications for this visit.   Family History  Problem Relation Age of Onset  . Adopted: Yes  . Hypertension Mother    History   Social History  . Marital Status: Single    Spouse Name: N/A    Number of Children: N/A  . Years of Education: N/A   Social History Main Topics  . Smoking status: Current Every Day Smoker -- 0.50 packs/day for 10 years    Types: Cigarettes  . Smokeless tobacco: Never Used     Comment: wants patches to stop smoking  . Alcohol Use: No  . Drug Use: No  . Sexual Activity: None   Other Topics Concern  . None   Social History Narrative   Previously divorced now engaged with her partner of 4 years.  Has 6 grown children with 21 grandchildren.  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  [place] and was transiently homeless until first part of October 2013.      Objective:  Physical Exam: Filed Vitals:   07/11/14 0939  BP: 158/89  Pulse:  108  Temp: 98.2 F (36.8 C)  TempSrc: Oral  Resp: 20  Height: 5' 9"  (1.753 m)  Weight: 264 lb 8 oz (119.976 kg)  SpO2: 97%   General: Well nourished. No acute distress. Uses a cane. Lungs: CTA bilaterally.  Heart: RRR; no extra sounds or murmurs  Abdomen: Non-distended, normal BS, soft, nontender; no hepatosplenomegaly  Extremities: No pedal edema. No joint swelling or tenderness. Small superficial bruise on the anterior left knee. No active bleeding.  Bilateral mild ankle edema. No  tenderness, erythema, or other signs of infection or inflammation.  Neurologic: Alert and oriented x3. No obvious neurologic deficits.   Assessment & Plan:  I have discussed my assessment and plan  with  my attending in the clinic, Dr. Dareen Piano  as detailed under problem based charting.

## 2014-07-12 NOTE — Assessment & Plan Note (Signed)
BP Readings from Last 3 Encounters:  07/11/14 158/89  06/14/14 109/73  05/30/14 124/78    Lab Results  Component Value Date   NA 138 05/27/2014   K 3.7 05/27/2014   CREATININE 1.08 05/27/2014    Assessment: Blood pressure control: moderately elevated Progress toward BP goal:  unchanged Comments: did not take medications today  Plan: Medications:  continue current medications Educational resources provided:   Self management tools provided:   Other plans: will f/u in a week. Encouraged compliance with her medication.

## 2014-07-12 NOTE — Assessment & Plan Note (Signed)
She reports compliance with Coumadin. Patient left clinic without obtaining INR. It will be performed on her next visit in one week

## 2014-07-12 NOTE — Progress Notes (Signed)
INTERNAL MEDICINE TEACHING ATTENDING ADDENDUM - Venetta Knee, MD: I reviewed and discussed at the time of visit with the resident Dr. Kazibwe, the patient's medical history, physical examination, diagnosis and results of pertinent tests and treatment and I agree with the patient's care as documented.  

## 2014-07-12 NOTE — Assessment & Plan Note (Signed)
Patient continues to complain of chronic back pain. She is asking for stronger pain medications. She has tried Tylenol, and NSAIDS before without much relief. She does not follow up with her pain physician. I am very hesitant at this time to start this patient on any opiates given her recurrent falls. Discussed this issue with her but she still insists that stronger medications might help her pain. Plan. -Try of tramadol 50 mg q8 prn. If this is not effective, I will try her back on Ultracet - No considering Opiates at this time due to falls.  - encouraged patient to f/u with pain doctor  - cont with outpatient PT

## 2014-07-12 NOTE — Assessment & Plan Note (Addendum)
Lab Results  Component Value Date   HGBA1C 13.3 05/16/2014   HGBA1C 13.7 02/07/2014   HGBA1C 10.4 08/14/2013     Assessment: Diabetes control: poor control (HgbA1C >9%) Progress toward A1C goal:  unchanged Comments: CBG 588. Asymptomatic. She did not take her insulin today.    Plan: Medications:  NPH-regular insulin 70/30 40 bid  Home glucose monitoring: Frequency: 3 times a day Timing: before breakfast;before lunch;before dinner Instruction/counseling given: reminded to get eye exam Educational resources provided:   Self management tools provided:   Other plans:  - discussed with attending  - Will manage as outpatient. I have encouraged her to take her insulin.  - Followup within one week - Is she develops symptoms of hyperglycemia, advised her to present to the ED - will get a new meter. Tommi Emery will assist with this.

## 2014-07-17 ENCOUNTER — Emergency Department (HOSPITAL_COMMUNITY): Payer: Medicare Other

## 2014-07-17 ENCOUNTER — Encounter (HOSPITAL_COMMUNITY): Payer: Self-pay | Admitting: Emergency Medicine

## 2014-07-17 ENCOUNTER — Emergency Department (HOSPITAL_COMMUNITY)
Admission: EM | Admit: 2014-07-17 | Discharge: 2014-07-17 | Disposition: A | Discharge status: Home / Self Care | Payer: Medicare Other | Attending: Emergency Medicine | Admitting: Emergency Medicine

## 2014-07-17 DIAGNOSIS — F17200 Nicotine dependence, unspecified, uncomplicated: Secondary | ICD-10-CM | POA: Insufficient documentation

## 2014-07-17 DIAGNOSIS — Z87442 Personal history of urinary calculi: Secondary | ICD-10-CM | POA: Insufficient documentation

## 2014-07-17 DIAGNOSIS — Z87440 Personal history of urinary (tract) infections: Secondary | ICD-10-CM | POA: Diagnosis not present

## 2014-07-17 DIAGNOSIS — M7989 Other specified soft tissue disorders: Secondary | ICD-10-CM | POA: Diagnosis present

## 2014-07-17 DIAGNOSIS — Z79899 Other long term (current) drug therapy: Secondary | ICD-10-CM | POA: Insufficient documentation

## 2014-07-17 DIAGNOSIS — S8990XA Unspecified injury of unspecified lower leg, initial encounter: Secondary | ICD-10-CM | POA: Diagnosis not present

## 2014-07-17 DIAGNOSIS — I2699 Other pulmonary embolism without acute cor pulmonale: Secondary | ICD-10-CM

## 2014-07-17 DIAGNOSIS — L03119 Cellulitis of unspecified part of limb: Principal | ICD-10-CM

## 2014-07-17 DIAGNOSIS — E119 Type 2 diabetes mellitus without complications: Secondary | ICD-10-CM | POA: Insufficient documentation

## 2014-07-17 DIAGNOSIS — F329 Major depressive disorder, single episode, unspecified: Secondary | ICD-10-CM | POA: Insufficient documentation

## 2014-07-17 DIAGNOSIS — L03032 Cellulitis of left toe: Secondary | ICD-10-CM

## 2014-07-17 DIAGNOSIS — L02619 Cutaneous abscess of unspecified foot: Secondary | ICD-10-CM | POA: Diagnosis not present

## 2014-07-17 DIAGNOSIS — M25569 Pain in unspecified knee: Secondary | ICD-10-CM | POA: Diagnosis not present

## 2014-07-17 DIAGNOSIS — M25559 Pain in unspecified hip: Secondary | ICD-10-CM | POA: Diagnosis not present

## 2014-07-17 DIAGNOSIS — Z8701 Personal history of pneumonia (recurrent): Secondary | ICD-10-CM | POA: Insufficient documentation

## 2014-07-17 DIAGNOSIS — Z794 Long term (current) use of insulin: Secondary | ICD-10-CM | POA: Insufficient documentation

## 2014-07-17 DIAGNOSIS — G608 Other hereditary and idiopathic neuropathies: Secondary | ICD-10-CM | POA: Insufficient documentation

## 2014-07-17 DIAGNOSIS — Z91040 Latex allergy status: Secondary | ICD-10-CM | POA: Insufficient documentation

## 2014-07-17 DIAGNOSIS — F3289 Other specified depressive episodes: Secondary | ICD-10-CM | POA: Insufficient documentation

## 2014-07-17 DIAGNOSIS — G43909 Migraine, unspecified, not intractable, without status migrainosus: Secondary | ICD-10-CM | POA: Diagnosis not present

## 2014-07-17 DIAGNOSIS — D7389 Other diseases of spleen: Secondary | ICD-10-CM | POA: Diagnosis not present

## 2014-07-17 DIAGNOSIS — Z7901 Long term (current) use of anticoagulants: Secondary | ICD-10-CM

## 2014-07-17 DIAGNOSIS — Z86711 Personal history of pulmonary embolism: Secondary | ICD-10-CM | POA: Diagnosis not present

## 2014-07-17 DIAGNOSIS — M79609 Pain in unspecified limb: Secondary | ICD-10-CM

## 2014-07-17 DIAGNOSIS — S79929A Unspecified injury of unspecified thigh, initial encounter: Secondary | ICD-10-CM | POA: Diagnosis not present

## 2014-07-17 DIAGNOSIS — I10 Essential (primary) hypertension: Secondary | ICD-10-CM | POA: Diagnosis not present

## 2014-07-17 DIAGNOSIS — S99919A Unspecified injury of unspecified ankle, initial encounter: Secondary | ICD-10-CM | POA: Diagnosis not present

## 2014-07-17 DIAGNOSIS — S79919A Unspecified injury of unspecified hip, initial encounter: Secondary | ICD-10-CM | POA: Diagnosis not present

## 2014-07-17 LAB — PROTIME-INR
INR: 1.24 (ref 0.00–1.49)
PROTHROMBIN TIME: 15.6 s — AB (ref 11.6–15.2)

## 2014-07-17 LAB — CBG MONITORING, ED: Glucose-Capillary: 207 mg/dL — ABNORMAL HIGH (ref 70–99)

## 2014-07-17 MED ORDER — HYDROCODONE-ACETAMINOPHEN 5-325 MG PO TABS
1.0000 | ORAL_TABLET | Freq: Once | ORAL | Status: AC
  Administered 2014-07-17: 1 via ORAL
  Filled 2014-07-17: qty 1

## 2014-07-17 MED ORDER — CLINDAMYCIN HCL 150 MG PO CAPS: 450.0000 mg | ORAL_CAPSULE | Freq: Four times a day (QID) | ORAL | Status: DC

## 2014-07-17 NOTE — ED Notes (Signed)
Per pt sts that she has been having left leg pain and swelling since yesterday. sts also a blister on her left great toe.

## 2014-07-17 NOTE — ED Provider Notes (Signed)
CSN: [id number]     Arrival date & time 07/17/14  0913 History   First MD Initiated Contact with Patient 07/17/14 [phone]     Chief Complaint  Patient presents with  . Leg Swelling     (Consider location/radiation/quality/duration/timing/severity/associated sxs/prior Treatment) The history is provided by the patient. No language interpreter was used.  Joy Butler is a 63 year old female past medical history of diabetes resulting in diabetic neuropathy, hypertension, PE, urinary tract infection, high cholesterol, depression, headache presenting to the ED with leg swelling localized to the left lower extremity that started approximately 4 days ago. As per patient, reported that the pain is all over her left flank described as a dull aching pain, throbbing worse with applying pressure to the left leg. Stated that she's been unable to apply any pressure secondary to the pain. Stated that she called her primary care provider on Saturday refilled prescription of tramadol for her. Stated that she's been taking tramadol with mild relief. Stated that over the past couple of days she's noticed swelling has increased to her left lower extremity. Stated that the left lower extremity feels tight. Reported that yesterday she had a fall secondary to the left leg giving out as stated that she landed on her left hip and left knee. Reported that she noticed a blister on her left toe on Sunday-reported that has been oozing and weeping of clear fluid. Patient reported that this is from wearing socks. Denied fever, chills, chest pain, shortness of breath, difficulty breathing, changes to color of the leg, red streaks, weakness, numbness, tingling.  PCP Dr. R. Kazibwe  Past Medical History  Diagnosis Date  . Diabetes mellitus     diagnosed in 2000.    . Hypertension   . Pulmonary embolism     2011 treated at [facility] in [place].  Was on Coumadin for over a year.  no known family history  . UTI (urinary tract  infection)   . High cholesterol   . 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 Depression   . Neuropathy     feet  . Pneumonia   . Kidney stones   . Headache(784.0)     migraines ( 12 years ago)   Past Surgical History  Procedure Laterality Date  . Abdominal hysterectomy    . Cholecystectomy      19 78  . Esophagogastroduodenoscopy  08/19/2012    Procedure: ESOPHAGOGASTRODUODENOSCOPY (EGD);  Surgeon: Winfield Cunas., MD;  [facility] ENDOSCOPY;  Service: Endoscopy;  Laterality: N/A;  . Cesarean section    . Carpal tunnel release Bilateral   . Tonsillectomy    . Breast surgery Left     breast biopsy  . Eye surgery Bilateral     lasik  . Artery biopsy Right 05/09/2014    Procedure: BIOPSY TEMPORAL ARTERY;  Surgeon: Rosetta Posner, MD;  Location: [facility] OR;  Service: Vascular;  Laterality: Right;   Family History  Problem Relation Age of Onset  . Adopted: Yes  . Hypertension Mother    History  Substance Use Topics  . Smoking status: Current Every Day Smoker -- 0.50 packs/day for 10 years    Types: Cigarettes  . Smokeless tobacco: Never Used     Comment: wants patches to stop smoking  . Alcohol Use: No   OB History   Grav Para Term Preterm Abortions TAB SAB Ect Mult Living  Review of Systems  Constitutional: Negative for fever and chills.  Respiratory: Negative for chest tightness, shortness of breath and wheezing.   Cardiovascular: Positive for leg swelling. Negative for chest pain.  Musculoskeletal: Positive for arthralgias (left hip and knee pain ).  Skin: Positive for color change and wound (blister).      Allergies  Keflex; Latex; Sulfa antibiotics; and Sulfur  Home Medications   Prior to Admission medications   Medication Sig Start Date End Date Taking? Authorizing Provider  acetaminophen (TYLENOL) 500 MG tablet Take 1,000 mg by mouth every 6 (six) hours as needed for moderate pain.   Yes Historical Provider, MD  amLODipine (NORVASC) 10  MG tablet Take 1 tablet (10 mg total) by mouth daily.   Yes Jessee Avers, MD  citalopram (CELEXA) 20 MG tablet Take 20 mg by mouth daily.   Yes Historical Provider, MD  clotrimazole (LOTRIMIN) 1 % cream Apply 1 application topically 2 (two) times daily. 05/16/14  Yes Jessee Avers, MD  insulin NPH-regular Human (NOVOLIN 70/30) (70-30) 100 UNIT/ML injection Inject 40 units subcutaneously twice daily. 06/14/14  Yes Carter Kitten, MD  loperamide (IMODIUM) 2 MG capsule Take 1 capsule (2 mg total) by mouth 3 (three) times daily as needed for diarrhea or loose stools.   Yes Jessee Avers, MD  losartan-hydrochlorothiazide (HYZAAR) 100-25 MG per tablet Take 1 tablet by mouth daily.   Yes Historical Provider, MD  metFORMIN (GLUCOPHAGE) 500 MG tablet Take 1 tablet (500 mg total) by mouth 2 (two) times daily with a meal.   Yes Jessee Avers, MD  Multiple Vitamin (MULTIVITAMIN WITH MINERALS) TABS tablet Take 1 tablet by mouth daily.   Yes Historical Provider, MD  omeprazole (PRILOSEC) 20 MG capsule Take 1 capsule (20 mg total) by mouth daily. 05/23/14  Yes Jessee Avers, MD  traMADol (ULTRAM) 50 MG tablet Take 1 tablet (50 mg total) by mouth every 8 (eight) hours as needed. 07/11/14 07/11/15 Yes Jessee Avers, MD  warfarin (COUMADIN) 5 MG tablet Take 2.5-3 tablets (12.5-15 mg total) by mouth daily. Patient takes 12.5 mg on all days except on Monday and Thursday patient takes 15mg    (patient uses 5mg  strength tablets) 05/30/14  Yes Jessee Avers, MD  clindamycin (CLEOCIN) 150 MG capsule Take 3 capsules (450 mg total) by mouth 4 (four) times daily. 07/17/14   Olive Zmuda, PA-C   BP 130/76  Pulse 95  Temp(Src) 98.3 F (36.8 C) (Oral)  Resp 22  SpO2 95% Physical Exam  Nursing note and vitals reviewed. Constitutional: She is oriented to person, place, and time. She appears well-developed and well-nourished. No distress.  HENT:  Head: Normocephalic and atraumatic.  Eyes: Conjunctivae and EOM are  normal. Pupils are equal, round, and reactive to light. Right eye exhibits no discharge. Left eye exhibits no discharge.  Neck: Normal range of motion. Neck supple. No tracheal deviation present.  Cardiovascular: Normal rate, regular rhythm and normal heart sounds.  Exam reveals no friction rub.   No murmur heard. Pulses:      Radial pulses are 2+ on the right side, and 2+ on the left side.       Dorsalis pedis pulses are 2+ on the right side, and 2+ on the left side.       Posterior tibial pulses are 2+ on the right side, and 2+ on the left side.  Cap refill less than 3 seconds 1+ pitting edema identified to lower extremities bilaterally-from the dorsal aspect of the feet to just below  the knees bilaterally.  Pulmonary/Chest: Effort normal and breath sounds normal. No respiratory distress. She has no wheezes. She has no rales.  Musculoskeletal:       Left hip: She exhibits decreased range of motion and tenderness. She exhibits normal strength, no bony tenderness, no swelling, no crepitus and no deformity.       Left knee: She exhibits decreased range of motion and swelling. She exhibits no effusion, no ecchymosis, no deformity, no laceration, no erythema and normal alignment. Tenderness found. Medial joint line, lateral joint line and patellar tendon tenderness noted.       Legs:      Feet:  Decreased range of motion to the left leg secondary to pain-mainly localized left hip and left knee. Mild swelling identified to the left knee with negative ecchymosis, inflammation, warmth upon palpation, deformities, malalignment. Decreased flexion secondary to pain-no discomfort noted with extension. Discomfort with abduction and flexion of the left hip with negative findings of deformities, malalignment, ecchymosis, septic joint.  Lymphadenopathy:    She has no cervical adenopathy.  Neurological: She is alert and oriented to person, place, and time. No cranial nerve deficit. She exhibits normal muscle  tone. Coordination normal.  Cranial nerves III-XII grossly intact Strength 5+/5+ to upper and lower extremities bilaterally with resistance applied, equal distribution noted Strength intact to digits of the feet bilaterally Equal grip strength Negative facial drooping Understood speech Negative aphasia Sensation intact Negative arm drift Fine motor skills intact  Skin: She is not diaphoretic. There is erythema.  Approximately 1.5 x 1.5 cm blister identified to the medial aspects of the left foot, just at the base of the left great toe with negative active drainage or bleeding noted. Margins clean. Mild erythema identified surrounding the blister measuring approximately 2 cm diameter. Mild warmth upon palpation. Discomfort upon palpation.  Psychiatric: She has a normal mood and affect. Her behavior is normal. Thought content normal.    ED Course  Procedures (including critical care time) Labs Review Labs Reviewed  PROTIME-INR - Abnormal; Notable for the following:    Prothrombin Time 15.6 (*)    All other components within normal limits  CBG MONITORING, ED - Abnormal; Notable for the following:    Glucose-Capillary 207 (*)    All other components within normal limits    Imaging Review Dg Hip Complete Left  07/17/2014   CLINICAL DATA:  Status post fall.  Left hip pain.  EXAM: LEFT HIP - COMPLETE 2+ VIEW  COMPARISON:  None.  FINDINGS: There is no acute bony or joint abnormality. Mild to moderate degenerative change is present about the hips. No evidence of avascular necrosis is seen. Atherosclerosis is noted.  IMPRESSION: No acute finding.   Electronically Signed   By: Inge Rise M.D.   On: 07/17/2014 10:59   Dg Knee Complete 4 Views Left  07/17/2014   CLINICAL DATA:  Status post fall.  Left knee pain.  EXAM: LEFT KNEE - COMPLETE 4+ VIEW  COMPARISON:  None.  FINDINGS: There is no acute bony or joint abnormality. No joint effusion is seen. 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 No joint effusion is identified. Atherosclerosis is noted.  IMPRESSION: No acute finding.  Tricompartmental degenerative disease.   Electronically Signed   By: Inge Rise M.D.   On: 07/17/2014 10:58     EKG Interpretation None      MDM   Final diagnoses:  Cellulitis of toe of left foot  Left leg swelling  Medications  HYDROcodone-acetaminophen (NORCO/VICODIN) 5-325 MG per tablet 1 tablet (1 tablet Oral Given 07/17/14 1142)    Filed Vitals:   07/17/14 0940 07/17/14 1000 07/17/14 1158 07/17/14 1304  BP: 129/75 127/75 142/74 130/76  Pulse:  93  95  Temp: 98.4 F (36.9 C)  98.3 F (36.8 C)   TempSrc: Oral  Oral   Resp: 19 21 13 22   SpO2: 95% 93% 94% 95%   CBG 207. INR 1.24, PT 15.6. Plain film of left knee negative for acute osseous injury-tricompartmental degenerative disease identified. Left hip plain film negative for acute osseous injury. Bilateral lower extremity venous duplex performed with no evidence of DVT, superficial thrombosis or Baker's cyst. Doubt DVT. Patient denied chest pain, shortness of breath, difficulty breathing-pulse ox 98% on room air-negative tachycardia or tachypnea identified. Doubt septic joint. Blister identified at the base of the left great toe medial aspect with surrounding erythema noted-will treat patient for beginnings of cellulitic infection - line of demarcation of erythema drawn. Patient stable, afebrile. Discharged patient. Discharged patient with antibiotics. Discussed with patient to continue her home medications as prescribed. Referred to PCP to be followed up as outpatient. Discussed with patient to closely monitor symptoms and if symptoms are to worsen or change to report back to the ED - strict return instructions given.  Patient agreed to plan of care, understood, all questions answered.   Jamse Mead, PA-C 07/17/14 1847

## 2014-07-17 NOTE — ED Notes (Signed)
Dressing applied. Pt alert x4 respirations easy non labored. Skin warm dry

## 2014-07-17 NOTE — Discharge Instructions (Signed)
Please call your doctor for a followup appointment within 24-48 hours. When you talk to your doctor please let them know that you were seen in the emergency department and have them acquire all of your records so that they can discuss the findings with you and formulate a treatment plan to fully care for your new and ongoing problems. Please call and set up an appointment with your primary care provider to be reassessed within the next 24-48 hours Please take medications as prescribed and on a full stomach Please continue to monitor glucose levels Please continue to take medications as prescribed at home Please elevate legs-toes above nose Highly recommend compression stockings to be placed on before getting out of bed every morning to aid in reduction of swelling Please continue to monitor symptoms closely if symptoms are to worsen or change (fever greater than 101, chills, chest pain, shortness of breath, difficulty breathing, worsening or changes to leg pain or swelling, redness spreads outside the lines of demarcation, drainage, weakness, numbness, tingling) please report back to the ED immediately  Cellulitis Cellulitis is an infection of the skin and the tissue beneath it. The infected area is usually red and tender. Cellulitis occurs most often in the arms and lower legs.  CAUSES  Cellulitis is caused by bacteria that enter the skin through cracks or cuts in the skin. The most common types of bacteria that cause cellulitis are staphylococci and streptococci. SIGNS AND SYMPTOMS   Redness and warmth.  Swelling.  Tenderness or pain.  Fever. DIAGNOSIS  Your health care provider can usually determine what is wrong based on a physical exam. Blood tests may also be done. TREATMENT  Treatment usually involves taking an antibiotic medicine. HOME CARE INSTRUCTIONS   Take your antibiotic medicine as directed by your health care provider. Finish the antibiotic even if you start to feel  better.  Keep the infected arm or leg elevated to reduce swelling.  Apply a warm cloth to the affected area up to 4 times per day to relieve pain.  Take medicines only as directed by your health care provider.  Keep all follow-up visits as directed by your health care provider. SEEK MEDICAL CARE IF:   You notice red streaks coming from the infected area.  Your red area gets larger or turns dark in color.  Your bone or joint underneath the infected area becomes painful after the skin has healed.  Your infection returns in the same area or another area.  You notice a swollen bump in the infected area.  You develop new symptoms.  You have a fever. SEEK IMMEDIATE MEDICAL CARE IF:   You feel very sleepy.  You develop vomiting or diarrhea.  You have a general ill feeling (malaise) with muscle aches and pains. MAKE SURE YOU:   Understand these instructions.  Will watch your condition.  Will get help right away if you are not doing well or get worse. Document Released: 09/16/2005 Document Revised: 04/23/2014 Document Reviewed: 02/22/2012 [facility] Patient Information 2015 [place], [place]. This information is not intended to replace advice given to you by your health care provider. Make sure you discuss any questions you have with your health care provider.

## 2014-07-17 NOTE — Progress Notes (Signed)
Bilateral lower extremity venous duplex:  No evidence of DVT, superficial thrombosis, or Baker's cyst.   

## 2014-07-17 NOTE — ED Notes (Signed)
PA at bedside.

## 2014-07-18 ENCOUNTER — Encounter: Payer: Self-pay | Admitting: Internal Medicine

## 2014-07-18 ENCOUNTER — Ambulatory Visit: Payer: BLUE CROSS/BLUE SHIELD | Admitting: Internal Medicine

## 2014-07-18 ENCOUNTER — Other Ambulatory Visit: Payer: Self-pay | Admitting: Internal Medicine

## 2014-07-18 ENCOUNTER — Telehealth: Payer: Self-pay | Admitting: Internal Medicine

## 2014-07-18 DIAGNOSIS — IMO0001 Reserved for inherently not codable concepts without codable children: Secondary | ICD-10-CM | POA: Diagnosis not present

## 2014-07-18 DIAGNOSIS — I10 Essential (primary) hypertension: Secondary | ICD-10-CM | POA: Diagnosis not present

## 2014-07-18 DIAGNOSIS — Z794 Long term (current) use of insulin: Secondary | ICD-10-CM | POA: Diagnosis not present

## 2014-07-18 DIAGNOSIS — Z7901 Long term (current) use of anticoagulants: Secondary | ICD-10-CM | POA: Diagnosis not present

## 2014-07-18 DIAGNOSIS — Z86711 Personal history of pulmonary embolism: Secondary | ICD-10-CM | POA: Diagnosis not present

## 2014-07-18 DIAGNOSIS — M316 Other giant cell arteritis: Secondary | ICD-10-CM | POA: Diagnosis not present

## 2014-07-18 MED ORDER — PREDNISONE (PAK) 10 MG PO TABS: ORAL_TABLET | ORAL | Status: DC

## 2014-07-18 NOTE — Telephone Encounter (Signed)
Patient did not show up to appointment this morning.  She was seen in the ER yesterday and prednisone taper was discontinued by the pharmacy technician.  Called and talked to patient to make sure she is taking prednisone taper for temporal arteritis.  She says she is still taking 20 mg prednisone daily, and I told her to continue.  Scheduled an appointment for tomorrow at 1:45 pm with Dr. Eyvonne Mechanic for follow up.

## 2014-07-19 ENCOUNTER — Ambulatory Visit (INDEPENDENT_AMBULATORY_CARE_PROVIDER_SITE_OTHER): Payer: Medicare Other | Admitting: Internal Medicine

## 2014-07-19 VITALS — BP 140/87 | HR 103 | Temp 98.0°F | Wt 271.4 lb

## 2014-07-19 DIAGNOSIS — IMO0002 Reserved for concepts with insufficient information to code with codable children: Secondary | ICD-10-CM | POA: Diagnosis not present

## 2014-07-19 DIAGNOSIS — X58XXXA Exposure to other specified factors, initial encounter: Secondary | ICD-10-CM

## 2014-07-19 DIAGNOSIS — I2699 Other pulmonary embolism without acute cor pulmonale: Secondary | ICD-10-CM | POA: Diagnosis not present

## 2014-07-19 DIAGNOSIS — Z5189 Encounter for other specified aftercare: Secondary | ICD-10-CM | POA: Diagnosis not present

## 2014-07-19 DIAGNOSIS — G8929 Other chronic pain: Secondary | ICD-10-CM | POA: Diagnosis not present

## 2014-07-19 DIAGNOSIS — I10 Essential (primary) hypertension: Secondary | ICD-10-CM | POA: Diagnosis not present

## 2014-07-19 DIAGNOSIS — M549 Dorsalgia, unspecified: Secondary | ICD-10-CM | POA: Diagnosis not present

## 2014-07-19 DIAGNOSIS — M316 Other giant cell arteritis: Secondary | ICD-10-CM

## 2014-07-19 DIAGNOSIS — T148XXA Other injury of unspecified body region, initial encounter: Secondary | ICD-10-CM

## 2014-07-19 DIAGNOSIS — E119 Type 2 diabetes mellitus without complications: Secondary | ICD-10-CM | POA: Diagnosis not present

## 2014-07-19 DIAGNOSIS — Z7901 Long term (current) use of anticoagulants: Secondary | ICD-10-CM | POA: Diagnosis not present

## 2014-07-19 DIAGNOSIS — S90422A Blister (nonthermal), left great toe, initial encounter: Secondary | ICD-10-CM | POA: Insufficient documentation

## 2014-07-19 LAB — GLUCOSE, CAPILLARY: Glucose-Capillary: 395 mg/dL — ABNORMAL HIGH (ref 70–99)

## 2014-07-19 NOTE — Assessment & Plan Note (Signed)
Open blister along the plantar aspect of the left great toe, with no surrounding cellulitis or super added infection. Seen in the ED on 07/17/14 for this and was prescribed Clindamycin x 10 days.  Plans: Cleaned the ulcer base with betadine. Dry dressing applied over the ulcer. Recommended [facility] to call us tomorrow to arrange for weekly visits to do the dressing. Recommended to call the clinic or seek medical help if she notices any fever, chills, muscle aches, purulent discharge.

## 2014-07-19 NOTE — Assessment & Plan Note (Signed)
Patient was started on Prednisone therapy for presumable temporal arteritis.  Suspect patient is having some of the side effects from the chronic prednisone therapy such as - puffiness of face, acne, worsening of DM etc.  Plans: Finish the prednisone taper as planned by Dr. Alice Rieger. Check TSH to rule out hypothyroidism for the cause of weakness.

## 2014-07-19 NOTE — Patient Instructions (Signed)
Take all the medications as recommended.  Do the dressing as instructed. If you notice worsening of redness, pain or discharge, or fever, chills, please call us or seek immediate medical help.

## 2014-07-19 NOTE — Progress Notes (Signed)
Subjective:   Patient ID: Joy Butler female   DOB: [birth year] 63 y.o.   MRN: [id number]  HPI: [name] is a 63 y.o. woman with PMH significant for HTN, DM-II, Temporal arteritis currently on Prednisone therapy comes to the office for ED follow up.  Patient was seen in the clinic for blister along the plantar aspect of the left great toe. Patient was prescribed Clindamycin for a total of 10 days duration. Patient reports compliance to the clindamycin. Patient reports also complained of left knee pain and left LE weakness for which she underwent xrays of left knee which revealed tricompartmental OA and doppler studies were negative for DVT. She reports feeling weak, tired and not able to get up from the sitting position.   Patient currently denies any fever, chills, muscle aches. She denies any worsening of the erythema, purulent discharge. She reports that she has [place] who has been doing dressings to her.   She denies any other complaints.  Past Medical History  Diagnosis Date  . Diabetes mellitus     diagnosed in 2000.    Marland Kitchen Hypertension   . Pulmonary embolism     2011 treated at [facility] in [place].  Was on Coumadin for over a year.  no known family history  . UTI (urinary tract infection)   . High cholesterol   . 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 Depression   . Neuropathy     feet  . Pneumonia   . Kidney stones   . Headache(784.0)     migraines ( 12 years ago)   Current Outpatient Prescriptions  Medication Sig Dispense Refill  . acetaminophen (TYLENOL) 500 MG tablet Take 1,000 mg by mouth every 6 (six) hours as needed for moderate pain.      Marland Kitchen amLODipine (NORVASC) 10 MG tablet Take 1 tablet (10 mg total) by mouth daily.  90 tablet  3  . citalopram (CELEXA) 20 MG tablet Take 20 mg by mouth daily.      . clindamycin (CLEOCIN) 150 MG capsule Take 3 capsules (450 mg total) by mouth 4 (four) times daily.  120 capsule  0  . clotrimazole  (LOTRIMIN) 1 % cream Apply 1 application topically 2 (two) times daily.  30 g  0  . insulin NPH-regular Human (NOVOLIN 70/30) (70-30) 100 UNIT/ML injection Inject 40 units subcutaneously twice daily.  20 mL  11  . loperamide (IMODIUM) 2 MG capsule Take 1 capsule (2 mg total) by mouth 3 (three) times daily as needed for diarrhea or loose stools.  30 capsule  3  . losartan-hydrochlorothiazide (HYZAAR) 100-25 MG per tablet Take 1 tablet by mouth daily.      . metFORMIN (GLUCOPHAGE) 500 MG tablet Take 1 tablet (500 mg total) by mouth 2 (two) times daily with a meal.  60 tablet  2  . Multiple Vitamin (MULTIVITAMIN WITH MINERALS) TABS tablet Take 1 tablet by mouth daily.      Marland Kitchen omeprazole (PRILOSEC) 20 MG capsule Take 1 capsule (20 mg total) by mouth daily.  90 capsule  3  . predniSONE (STERAPRED UNI-PAK) 10 MG tablet Take prednisone 20 mg daily until 07/25/14. Then prednisone 15 mg daily 8/6 to 08/09/2014. Then prednisone 10 mg 08/10/2014 to the end of February 2016.  Then decrease by 1 mg every month until stopped. Call [phone] for further instructions on 07/25/14.  90 tablet  0  . traMADol (ULTRAM) 50 MG tablet Take  1 tablet (50 mg total) by mouth every 8 (eight) hours as needed.  45 tablet  0  . warfarin (COUMADIN) 5 MG tablet Take 2.5-3 tablets (12.5-15 mg total) by mouth daily. Patient takes 12.5 mg on all days except on Monday and Thursday patient takes 15mg    (patient uses 5mg  strength tablets)  30 tablet  1   No current facility-administered medications for this visit.   Family History  Problem Relation Age of Onset  . Adopted: Yes  . Hypertension Mother    History   Social History  . Marital Status: Single    Spouse Name: N/A    Number of Children: N/A  . Years of Education: N/A   Social History Main Topics  . Smoking status: Current Every Day Smoker -- 0.50 packs/day for 10 years    Types: Cigarettes  . Smokeless tobacco: Never Used     Comment: wants patches to stop smoking  .  Alcohol Use: No  . Drug Use: No  . Sexual Activity: Not on file   Other Topics Concern  . Not on file   Social History Narrative   Previously divorced now engaged with her partner of 4 years.  Has 6 grown children with 21 grandchildren.  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  Moved to [place] in August 2013 and was transiently homeless until first part of October 2013.     Review of Systems: Pertinent items are noted in HPI. Objective:  Physical Exam: Filed Vitals:   07/19/14 1342  BP: 140/87  Pulse: 103  Temp: 98 F (36.7 C)  TempSrc: Oral  Weight: 271 lb 6.4 oz (123.106 kg)   Constitutional: Vital signs reviewed.  Patient is a morbidly obese african american women in her 5, sitting in a wheel chair, in no acute distress.  Patient is co-operative with exam.  Head: Puffiness of the face noticed. Acne noticed over the right and left cheek over the mandibular ramus area and acne noted over the chin.  Cardiovascular: RRR, S1 normal, S2 normal, no MRG  Pulmonary/Chest: normal respiratory effort, CTAB, no wheezes, rales, or rhonchi  Extremities: Trace pitting noted in both left lower extremities.  Neurological: A&O x3  Skin: There is an open blister noted along the plantar aspect of the left great toe, about 2 cm in diameter, with red base, good margins, no super added infection. Ulcer cleaned with betadine and dry dressing applied.  Psychiatry: Appears tired.  Assessment & Plan:

## 2014-07-20 LAB — TSH: TSH: 0.831 u[IU]/mL (ref 0.350–4.500)

## 2014-07-20 NOTE — Progress Notes (Signed)
INTERNAL MEDICINE TEACHING ATTENDING ADDENDUM - Aldine Contes, MD: I reviewed and discussed at the time of visit with the resident Dr. Eyvonne Mechanic, the patient's medical history, physical examination, diagnosis and results of pertinent tests and treatment and I agree with the patient's care as documented.

## 2014-07-21 DIAGNOSIS — Z86711 Personal history of pulmonary embolism: Secondary | ICD-10-CM | POA: Diagnosis not present

## 2014-07-21 DIAGNOSIS — Z794 Long term (current) use of insulin: Secondary | ICD-10-CM | POA: Diagnosis not present

## 2014-07-21 DIAGNOSIS — IMO0001 Reserved for inherently not codable concepts without codable children: Secondary | ICD-10-CM | POA: Diagnosis not present

## 2014-07-21 DIAGNOSIS — Z7901 Long term (current) use of anticoagulants: Secondary | ICD-10-CM | POA: Diagnosis not present

## 2014-07-21 DIAGNOSIS — M316 Other giant cell arteritis: Secondary | ICD-10-CM | POA: Diagnosis not present

## 2014-07-21 DIAGNOSIS — I10 Essential (primary) hypertension: Secondary | ICD-10-CM | POA: Diagnosis not present

## 2014-07-23 NOTE — ED Provider Notes (Signed)
Medical screening examination/treatment/procedure(s) were performed by non-physician practitioner and as supervising physician I was immediately available for consultation/collaboration.   EKG Interpretation None        Hoy Morn, MD 07/23/14 2306

## 2014-07-23 NOTE — Telephone Encounter (Signed)
Pt had appt with Dr Eyvonne Mechanic 07/19/14.

## 2014-07-24 ENCOUNTER — Telehealth: Payer: Self-pay | Admitting: *Deleted

## 2014-07-24 DIAGNOSIS — I10 Essential (primary) hypertension: Secondary | ICD-10-CM | POA: Diagnosis not present

## 2014-07-24 DIAGNOSIS — M316 Other giant cell arteritis: Secondary | ICD-10-CM | POA: Diagnosis not present

## 2014-07-24 DIAGNOSIS — IMO0001 Reserved for inherently not codable concepts without codable children: Secondary | ICD-10-CM | POA: Diagnosis not present

## 2014-07-24 DIAGNOSIS — Z794 Long term (current) use of insulin: Secondary | ICD-10-CM | POA: Diagnosis not present

## 2014-07-24 DIAGNOSIS — Z86711 Personal history of pulmonary embolism: Secondary | ICD-10-CM | POA: Diagnosis not present

## 2014-07-24 DIAGNOSIS — Z7901 Long term (current) use of anticoagulants: Secondary | ICD-10-CM | POA: Diagnosis not present

## 2014-07-24 NOTE — Telephone Encounter (Signed)
Call from [facility] with [facility] [phone]  Nurse reports pt is having an adverse reaction to Cleocin, that she is taking for cellulitis of great toe.  She has a rash on arms and is itching. Pt stopped meds a few days ago and rash improving.  pt did take benadryl. Pt went to ED on 7/28 and put on cleocin 150 mg 3 pills 4 times a day.  Pt did have a f/u in clinic on 7/30 with Dr Eyvonne Mechanic. Nurse from [facility] is asking for orders on how to treat wound to left great toe, wound is 4 cm X 3.5 cm, skin is peeling around area.  Wound is on plantar aspect of toe.  Small amount of granulating tissue.  No drainage.   Nurse does not feel a wound nurse needs to treat.  Pt can follow simple directions. Patient using neosporin and band aid.  Nurse is also asking for PT referral as pt is having a hard time getting up and down.  No appointments open today.  Do you want pt evaluated for wound care orders?

## 2014-07-24 NOTE — Telephone Encounter (Signed)
I think she needs to be evaluated in the clinic for wound care orders. It is possible to have her come in tomorrow>

## 2014-07-24 NOTE — Telephone Encounter (Signed)
[facility] called and told pt needs to be seen.  She will call pt with appointment tomorrow at 3:15.

## 2014-07-25 ENCOUNTER — Encounter: Payer: Self-pay | Admitting: Internal Medicine

## 2014-07-25 ENCOUNTER — Ambulatory Visit: Payer: Medicare Other | Admitting: Internal Medicine

## 2014-07-26 ENCOUNTER — Encounter (HOSPITAL_COMMUNITY): Payer: Self-pay | Admitting: General Practice

## 2014-07-26 ENCOUNTER — Encounter: Payer: Self-pay | Admitting: *Deleted

## 2014-07-26 ENCOUNTER — Inpatient Hospital Stay (HOSPITAL_COMMUNITY)
Admission: AD | Admit: 2014-07-26 | Discharge: 2014-07-30 | DRG: 638 | Disposition: A | Discharge status: Home Health | Payer: Medicare Other | Source: Ambulatory Visit | Attending: Internal Medicine | Admitting: Internal Medicine

## 2014-07-26 ENCOUNTER — Encounter: Payer: Self-pay | Admitting: Internal Medicine

## 2014-07-26 ENCOUNTER — Telehealth: Payer: Self-pay | Admitting: *Deleted

## 2014-07-26 ENCOUNTER — Ambulatory Visit (INDEPENDENT_AMBULATORY_CARE_PROVIDER_SITE_OTHER): Payer: Medicare Other | Admitting: Internal Medicine

## 2014-07-26 ENCOUNTER — Inpatient Hospital Stay (HOSPITAL_COMMUNITY): Payer: Medicare Other

## 2014-07-26 VITALS — BP 150/89 | HR 104 | Temp 98.1°F | Ht 69.0 in | Wt 275.8 lb

## 2014-07-26 DIAGNOSIS — L97509 Non-pressure chronic ulcer of other part of unspecified foot with unspecified severity: Secondary | ICD-10-CM | POA: Diagnosis not present

## 2014-07-26 DIAGNOSIS — S90422S Blister (nonthermal), left great toe, sequela: Secondary | ICD-10-CM

## 2014-07-26 DIAGNOSIS — R269 Unspecified abnormalities of gait and mobility: Secondary | ICD-10-CM | POA: Diagnosis present

## 2014-07-26 DIAGNOSIS — G8929 Other chronic pain: Secondary | ICD-10-CM

## 2014-07-26 DIAGNOSIS — F3289 Other specified depressive episodes: Secondary | ICD-10-CM | POA: Diagnosis present

## 2014-07-26 DIAGNOSIS — E7800 Pure hypercholesterolemia, unspecified: Secondary | ICD-10-CM | POA: Diagnosis present

## 2014-07-26 DIAGNOSIS — Z881 Allergy status to other antibiotic agents status: Secondary | ICD-10-CM | POA: Diagnosis not present

## 2014-07-26 DIAGNOSIS — E1149 Type 2 diabetes mellitus with other diabetic neurological complication: Secondary | ICD-10-CM | POA: Diagnosis present

## 2014-07-26 DIAGNOSIS — IMO0002 Reserved for concepts with insufficient information to code with codable children: Secondary | ICD-10-CM | POA: Diagnosis not present

## 2014-07-26 DIAGNOSIS — E119 Type 2 diabetes mellitus without complications: Secondary | ICD-10-CM | POA: Diagnosis present

## 2014-07-26 DIAGNOSIS — Z794 Long term (current) use of insulin: Secondary | ICD-10-CM | POA: Diagnosis not present

## 2014-07-26 DIAGNOSIS — E1142 Type 2 diabetes mellitus with diabetic polyneuropathy: Secondary | ICD-10-CM

## 2014-07-26 DIAGNOSIS — I2782 Chronic pulmonary embolism: Secondary | ICD-10-CM | POA: Diagnosis present

## 2014-07-26 DIAGNOSIS — M549 Dorsalgia, unspecified: Secondary | ICD-10-CM | POA: Diagnosis present

## 2014-07-26 DIAGNOSIS — E08621 Diabetes mellitus due to underlying condition with foot ulcer: Secondary | ICD-10-CM

## 2014-07-26 DIAGNOSIS — E118 Type 2 diabetes mellitus with unspecified complications: Secondary | ICD-10-CM

## 2014-07-26 DIAGNOSIS — E11621 Type 2 diabetes mellitus with foot ulcer: Secondary | ICD-10-CM | POA: Diagnosis present

## 2014-07-26 DIAGNOSIS — IMO0001 Reserved for inherently not codable concepts without codable children: Secondary | ICD-10-CM | POA: Diagnosis not present

## 2014-07-26 DIAGNOSIS — I10 Essential (primary) hypertension: Secondary | ICD-10-CM | POA: Diagnosis present

## 2014-07-26 DIAGNOSIS — M316 Other giant cell arteritis: Secondary | ICD-10-CM

## 2014-07-26 DIAGNOSIS — Z79899 Other long term (current) drug therapy: Secondary | ICD-10-CM

## 2014-07-26 DIAGNOSIS — L97529 Non-pressure chronic ulcer of other part of left foot with unspecified severity: Secondary | ICD-10-CM

## 2014-07-26 DIAGNOSIS — T45515A Adverse effect of anticoagulants, initial encounter: Secondary | ICD-10-CM | POA: Diagnosis present

## 2014-07-26 DIAGNOSIS — K5900 Constipation, unspecified: Secondary | ICD-10-CM | POA: Diagnosis present

## 2014-07-26 DIAGNOSIS — F329 Major depressive disorder, single episode, unspecified: Secondary | ICD-10-CM | POA: Diagnosis present

## 2014-07-26 DIAGNOSIS — I2699 Other pulmonary embolism without acute cor pulmonale: Secondary | ICD-10-CM

## 2014-07-26 DIAGNOSIS — M79609 Pain in unspecified limb: Secondary | ICD-10-CM | POA: Diagnosis not present

## 2014-07-26 DIAGNOSIS — M7989 Other specified soft tissue disorders: Secondary | ICD-10-CM

## 2014-07-26 DIAGNOSIS — L02619 Cutaneous abscess of unspecified foot: Secondary | ICD-10-CM | POA: Diagnosis not present

## 2014-07-26 DIAGNOSIS — E1165 Type 2 diabetes mellitus with hyperglycemia: Principal | ICD-10-CM | POA: Diagnosis present

## 2014-07-26 DIAGNOSIS — L02419 Cutaneous abscess of limb, unspecified: Secondary | ICD-10-CM | POA: Diagnosis present

## 2014-07-26 DIAGNOSIS — Z7901 Long term (current) use of anticoagulants: Secondary | ICD-10-CM | POA: Diagnosis not present

## 2014-07-26 DIAGNOSIS — Z86711 Personal history of pulmonary embolism: Secondary | ICD-10-CM | POA: Diagnosis not present

## 2014-07-26 DIAGNOSIS — R791 Abnormal coagulation profile: Secondary | ICD-10-CM | POA: Diagnosis present

## 2014-07-26 DIAGNOSIS — E1169 Type 2 diabetes mellitus with other specified complication: Principal | ICD-10-CM

## 2014-07-26 DIAGNOSIS — L03119 Cellulitis of unspecified part of limb: Secondary | ICD-10-CM | POA: Diagnosis present

## 2014-07-26 DIAGNOSIS — F17200 Nicotine dependence, unspecified, uncomplicated: Secondary | ICD-10-CM | POA: Diagnosis present

## 2014-07-26 HISTORY — DX: Headache, unspecified: R51.9

## 2014-07-26 HISTORY — DX: Unspecified osteoarthritis, unspecified site: M19.90

## 2014-07-26 HISTORY — DX: Migraine, unspecified, not intractable, without status migrainosus: G43.909

## 2014-07-26 HISTORY — DX: Type 2 diabetes mellitus without complications: E11.9

## 2014-07-26 HISTORY — DX: Dorsalgia, unspecified: M54.9

## 2014-07-26 HISTORY — DX: Other chronic pain: G89.29

## 2014-07-26 HISTORY — DX: Headache: R51

## 2014-07-26 LAB — CBC WITH DIFFERENTIAL/PLATELET
BASOS PCT: 0 % (ref 0–1)
Basophils Absolute: 0 10*3/uL (ref 0.0–0.1)
Eosinophils Absolute: 0.1 10*3/uL (ref 0.0–0.7)
Eosinophils Relative: 1 % (ref 0–5)
HCT: 36.5 % (ref 36.0–46.0)
Hemoglobin: 11.5 g/dL — ABNORMAL LOW (ref 12.0–15.0)
Lymphocytes Relative: 10 % — ABNORMAL LOW (ref 12–46)
Lymphs Abs: 1.2 10*3/uL (ref 0.7–4.0)
MCH: 28 pg (ref 26.0–34.0)
MCHC: 31.5 g/dL (ref 30.0–36.0)
MCV: 89 fL (ref 78.0–100.0)
Monocytes Absolute: 0.8 10*3/uL (ref 0.1–1.0)
Monocytes Relative: 6 % (ref 3–12)
NEUTROS PCT: 83 % — AB (ref 43–77)
Neutro Abs: 10.3 10*3/uL — ABNORMAL HIGH (ref 1.7–7.7)
Platelets: 249 10*3/uL (ref 150–400)
RBC: 4.1 MIL/uL (ref 3.87–5.11)
RDW: 17.7 % — ABNORMAL HIGH (ref 11.5–15.5)
WBC: 12.4 10*3/uL — ABNORMAL HIGH (ref 4.0–10.5)

## 2014-07-26 LAB — COMPREHENSIVE METABOLIC PANEL
ALK PHOS: 154 U/L — AB (ref 39–117)
ALT: 42 U/L — ABNORMAL HIGH (ref 0–35)
AST: 14 U/L (ref 0–37)
Albumin: 2.3 g/dL — ABNORMAL LOW (ref 3.5–5.2)
Anion gap: 11 (ref 5–15)
BILIRUBIN TOTAL: 0.2 mg/dL — AB (ref 0.3–1.2)
BUN: 20 mg/dL (ref 6–23)
CHLORIDE: 103 meq/L (ref 96–112)
CO2: 26 meq/L (ref 19–32)
Calcium: 9.3 mg/dL (ref 8.4–10.5)
Creatinine, Ser: 1.19 mg/dL — ABNORMAL HIGH (ref 0.50–1.10)
GFR calc non Af Amer: 47 mL/min — ABNORMAL LOW (ref >90)
GFR, EST AFRICAN AMERICAN: 55 mL/min — AB (ref >90)
GLUCOSE: 338 mg/dL — AB (ref 70–99)
Potassium: 4.4 mEq/L (ref 3.7–5.3)
Sodium: 140 mEq/L (ref 137–147)
Total Protein: 6.3 g/dL (ref 6.0–8.3)

## 2014-07-26 LAB — GLUCOSE, CAPILLARY
GLUCOSE-CAPILLARY: 323 mg/dL — AB (ref 70–99)
GLUCOSE-CAPILLARY: 391 mg/dL — AB (ref 70–99)
Glucose-Capillary: 327 mg/dL — ABNORMAL HIGH (ref 70–99)

## 2014-07-26 LAB — POCT GLYCOSYLATED HEMOGLOBIN (HGB A1C): HEMOGLOBIN A1C: 13.4

## 2014-07-26 LAB — PROTIME-INR
INR: 1.27 (ref 0.00–1.49)
Prothrombin Time: 15.9 seconds — ABNORMAL HIGH (ref 11.6–15.2)

## 2014-07-26 MED ORDER — SODIUM CHLORIDE 0.9 % IV SOLN
1250.0000 mg | Freq: Two times a day (BID) | INTRAVENOUS | Status: DC
  Administered 2014-07-27: 1250 mg via INTRAVENOUS
  Filled 2014-07-26 (×2): qty 1250

## 2014-07-26 MED ORDER — GADOBENATE DIMEGLUMINE 529 MG/ML IV SOLN
20.0000 mL | Freq: Once | INTRAVENOUS | Status: AC | PRN
  Administered 2014-07-26: 20 mL via INTRAVENOUS

## 2014-07-26 MED ORDER — WARFARIN - PHARMACIST DOSING INPATIENT
Freq: Every day | Status: DC
  Administered 2014-07-27 – 2014-07-29 (×2)

## 2014-07-26 MED ORDER — ONDANSETRON HCL 4 MG PO TABS: 4.0000 mg | ORAL_TABLET | Freq: Four times a day (QID) | ORAL | Status: DC | PRN

## 2014-07-26 MED ORDER — LOSARTAN POTASSIUM 50 MG PO TABS
100.0000 mg | ORAL_TABLET | Freq: Every day | ORAL | Status: DC
  Administered 2014-07-27 – 2014-07-30 (×4): 100 mg via ORAL
  Filled 2014-07-26 (×4): qty 2

## 2014-07-26 MED ORDER — LOSARTAN POTASSIUM-HCTZ 100-25 MG PO TABS: 1.0000 | ORAL_TABLET | Freq: Every day | ORAL | Status: DC

## 2014-07-26 MED ORDER — PREDNISONE 5 MG PO TABS
15.0000 mg | ORAL_TABLET | Freq: Every day | ORAL | Status: DC
  Administered 2014-07-27 – 2014-07-30 (×4): 15 mg via ORAL
  Filled 2014-07-26 (×5): qty 1

## 2014-07-26 MED ORDER — HYDROCODONE-ACETAMINOPHEN 5-325 MG PO TABS
1.0000 | ORAL_TABLET | ORAL | Status: DC | PRN
  Administered 2014-07-26 – 2014-07-30 (×13): 2 via ORAL
  Filled 2014-07-26 (×13): qty 2

## 2014-07-26 MED ORDER — SODIUM CHLORIDE 0.9 % IV SOLN
INTRAVENOUS | Status: AC
  Administered 2014-07-26 – 2014-07-27 (×2): via INTRAVENOUS

## 2014-07-26 MED ORDER — NICOTINE 14 MG/24HR TD PT24
14.0000 mg | MEDICATED_PATCH | Freq: Every day | TRANSDERMAL | Status: DC
  Administered 2014-07-27 – 2014-07-30 (×4): 14 mg via TRANSDERMAL
  Filled 2014-07-26 (×4): qty 1

## 2014-07-26 MED ORDER — VANCOMYCIN HCL 10 G IV SOLR
1750.0000 mg | Freq: Once | INTRAVENOUS | Status: AC
  Administered 2014-07-26: 1750 mg via INTRAVENOUS
  Filled 2014-07-26: qty 1750

## 2014-07-26 MED ORDER — ONDANSETRON HCL 4 MG/2ML IJ SOLN: 4.0000 mg | Freq: Four times a day (QID) | INTRAMUSCULAR | Status: DC | PRN

## 2014-07-26 MED ORDER — ACETAMINOPHEN 325 MG PO TABS: 650.0000 mg | ORAL_TABLET | Freq: Four times a day (QID) | ORAL | Status: DC | PRN

## 2014-07-26 MED ORDER — INSULIN ASPART 100 UNIT/ML ~~LOC~~ SOLN: 0.0000 [IU] | SUBCUTANEOUS | Status: DC

## 2014-07-26 MED ORDER — INSULIN GLARGINE 100 UNIT/ML ~~LOC~~ SOLN
10.0000 [IU] | Freq: Every day | SUBCUTANEOUS | Status: DC
  Administered 2014-07-26: 10 [IU] via SUBCUTANEOUS
  Filled 2014-07-26 (×2): qty 0.1

## 2014-07-26 MED ORDER — ACETAMINOPHEN 500 MG PO TABS: 1000.0000 mg | ORAL_TABLET | Freq: Four times a day (QID) | ORAL | Status: DC | PRN

## 2014-07-26 MED ORDER — ACETAMINOPHEN 650 MG RE SUPP: 650.0000 mg | Freq: Four times a day (QID) | RECTAL | Status: DC | PRN

## 2014-07-26 MED ORDER — INSULIN ASPART 100 UNIT/ML ~~LOC~~ SOLN
0.0000 [IU] | Freq: Three times a day (TID) | SUBCUTANEOUS | Status: DC
  Administered 2014-07-27 (×2): 5 [IU] via SUBCUTANEOUS

## 2014-07-26 MED ORDER — PIPERACILLIN-TAZOBACTAM 3.375 G IVPB
3.3750 g | Freq: Three times a day (TID) | INTRAVENOUS | Status: DC
  Administered 2014-07-27 (×3): 3.375 g via INTRAVENOUS
  Filled 2014-07-26 (×6): qty 50

## 2014-07-26 MED ORDER — TRAMADOL HCL 50 MG PO TABS
50.0000 mg | ORAL_TABLET | Freq: Four times a day (QID) | ORAL | Status: DC | PRN
  Administered 2014-07-29 (×2): 50 mg via ORAL
  Filled 2014-07-26 (×2): qty 1

## 2014-07-26 MED ORDER — CITALOPRAM HYDROBROMIDE 20 MG PO TABS
20.0000 mg | ORAL_TABLET | Freq: Every day | ORAL | Status: DC
  Administered 2014-07-27 – 2014-07-30 (×4): 20 mg via ORAL
  Filled 2014-07-26 (×4): qty 1

## 2014-07-26 MED ORDER — WARFARIN SODIUM 7.5 MG PO TABS
15.0000 mg | ORAL_TABLET | Freq: Once | ORAL | Status: AC
Start: 2014-07-26 — End: 2014-07-26
  Administered 2014-07-26: 15 mg via ORAL
  Filled 2014-07-26: qty 2

## 2014-07-26 MED ORDER — SODIUM CHLORIDE 0.9 % IJ SOLN
3.0000 mL | Freq: Two times a day (BID) | INTRAMUSCULAR | Status: DC
Start: 2014-07-26 — End: 2014-07-30
  Administered 2014-07-26 – 2014-07-30 (×8): 3 mL via INTRAVENOUS

## 2014-07-26 MED ORDER — PANTOPRAZOLE SODIUM 40 MG PO TBEC
40.0000 mg | DELAYED_RELEASE_TABLET | Freq: Every day | ORAL | Status: DC
  Administered 2014-07-27 – 2014-07-30 (×4): 40 mg via ORAL
  Filled 2014-07-26 (×4): qty 1

## 2014-07-26 MED ORDER — HYDROCHLOROTHIAZIDE 25 MG PO TABS
25.0000 mg | ORAL_TABLET | Freq: Every day | ORAL | Status: DC
Start: 2014-07-27 — End: 2014-07-30
  Administered 2014-07-27 – 2014-07-30 (×4): 25 mg via ORAL
  Filled 2014-07-26 (×4): qty 1

## 2014-07-26 MED ORDER — AMLODIPINE BESYLATE 10 MG PO TABS
10.0000 mg | ORAL_TABLET | Freq: Every day | ORAL | Status: DC
  Administered 2014-07-27 – 2014-07-30 (×4): 10 mg via ORAL
  Filled 2014-07-26 (×4): qty 1

## 2014-07-26 MED ORDER — ADULT MULTIVITAMIN W/MINERALS CH
1.0000 | ORAL_TABLET | Freq: Every day | ORAL | Status: DC
  Administered 2014-07-27 – 2014-07-30 (×4): 1 via ORAL
  Filled 2014-07-26 (×4): qty 1

## 2014-07-26 NOTE — Progress Notes (Signed)
Pt admitted to room 6E 24 from wound clinic.  VSS. Denies acute pain but has chronic back pain.

## 2014-07-26 NOTE — Assessment & Plan Note (Signed)
subtherapeutic INR on 7/28 Will need to f/u with Dr. Elie Confer in the future

## 2014-07-26 NOTE — Progress Notes (Signed)
ANTIBIOTIC CONSULT NOTE - INITIAL And Coumadin  Pharmacy Consult for Vancomycin, Zosyn, Coumadin Indication: wounds on lt foot, coumadin for recurrent PE  Allergies  Allergen Reactions  . Keflex [Cephalexin] Itching and Swelling    Lips and eyes swelled up  . Latex Itching  . Sulfa Antibiotics Rash  . Sulfur Rash    Patient Measurements: Height: 5\' 9"  (175.3 cm) Weight: 275 lb 9.2 oz (125 kg) IBW/kg (Calculated) : 66.2 Adjusted Body Weight:   Vital Signs: Temp: 98.3 F (36.8 C) (08/06 2043) Temp src: Oral (08/06 2043) BP: 186/109 mmHg (08/06 2043) Pulse Rate: 103 (08/06 2043) Intake/Output from previous day:   Intake/Output from this shift: Total I/O In: 360 [P.O.:360] Out: -   Labs:  Recent Labs  07/26/14 1830  WBC 12.4*  HGB 11.5*  PLT 249  CREATININE 1.19*   Estimated Creatinine Clearance: 67.6 ml/min (by C-G formula based on Cr of 1.19). No results found for this basename: VANCOTROUGH, VANCOPEAK, VANCORANDOM, GENTTROUGH, GENTPEAK, GENTRANDOM, TOBRATROUGH, TOBRAPEAK, TOBRARND, AMIKACINPEAK, AMIKACINTROU, AMIKACIN,  in the last 72 hours   Microbiology: No results found for this or any previous visit (from the past 720 hour(s)).  Medical History: Past Medical History  Diagnosis Date  . Hypertension   . Pulmonary embolism     2011 treated at [facility] in [place].  Was on Coumadin for over a year.  no known family history  . UTI (urinary tract infection)   . High cholesterol   . 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 Depression   . Neuropathy     feet  . Kidney stones   . Pneumonia     "several times" (07/26/2014)  . Type II diabetes mellitus dx'd 2000  . Daily headache   . Migraine     "last one was in the 1990's" (07/26/2014)  . Arthritis     "knees" (07/26/2014)  . Chronic back pain     Medications:  Scheduled:  . [START ON 07/27/2014] amLODipine  10 mg Oral Daily  . [START ON 07/27/2014] citalopram  20 mg Oral Daily  . [START ON 07/27/2014]  losartan  100 mg Oral Daily   And  . [START ON 07/27/2014] hydrochlorothiazide  25 mg Oral Daily  . [START ON 07/27/2014] insulin aspart  0-15 Units Subcutaneous TID WC  . insulin glargine  10 Units Subcutaneous QHS  . [START ON 07/27/2014] multivitamin with minerals  1 tablet Oral Daily  . [START ON 07/27/2014] nicotine  14 mg Transdermal Daily  . [START ON 07/27/2014] pantoprazole  40 mg Oral Daily  . piperacillin-tazobactam (ZOSYN)  IV  3.375 g Intravenous 3 times per day  . [START ON 07/27/2014] predniSONE  15 mg Oral Q breakfast  . sodium chloride  3 mL Intravenous Q12H  . [START ON 07/27/2014] vancomycin  1,250 mg Intravenous Q12H  . vancomycin  1,750 mg Intravenous Once  . warfarin  15 mg Oral Once  . [START ON 07/27/2014] Warfarin - Pharmacist Dosing Inpatient   Does not apply q1800   Assessment: 63 yr old female was sent from the clinic with a left foot wound, subtherapeutic coumadin, and uncontrolled DM2 (A1C 13.4 today). She had finished 10 days of clindamycin but the foot wound was looking worse.  Goal of Therapy:  Vancomycin trough level 10-15 mcg/ml  Plan:  1 Coumadin 15 mg x 1. Daily INR 2 Zosyn 3.375 Gm IV q8h 3 Vancomycin 1750 loading dose, then 1250 mg q12hr.  Levels when appropriate.  Minta Balsam 07/26/2014,10:38 PM

## 2014-07-26 NOTE — Assessment & Plan Note (Signed)
BP Readings from Last 3 Encounters:  07/26/14 150/89  07/19/14 140/87  07/17/14 130/76    Lab Results  Component Value Date   NA 138 05/27/2014   K 3.7 05/27/2014   CREATININE 1.08 05/27/2014    Assessment: Blood pressure control: moderately elevated Progress toward BP goal:  deteriorated Comments: none  Plan: Medications:  continue current medications (Norvasc 10, Hyzaar 100-25 mg qd) Educational resources provided: brochure Other plans: emphasized med compliance

## 2014-07-26 NOTE — H&P (Signed)
Date: 07/26/2014               Patient Name:  Joy Butler MRN: [id number]  DOB: [birth year] Age / Sex: 63 y.o., female   PCP: Jessee Avers, MD         Medical Service: Internal Medicine Teaching Service         Attending Physician: Dr. Carlyle Basques, MD    First Contact: Dr. Genene Churn Pager: [phone]  Second Contact: Dr. 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 She noticed some left lower extremity swelling and pain for the last two weeks. She noticed a blister on the plantar aspect of the base of left toe 11 days ago. It has been weeping clear fluid. Denies trauma to her foot.  She was seen in the ED 7/28 for the swelling and blister. XR left hip and knee were negative for acute process. Bilateral lower extremity venous duplex with no evidence of DVT, superficial thrombosis or Bakers cyst. A line was drawn to demarcate her erythema. She was discharged with clindamycin 450mg  QID x 10 days. She was seen in clinic 7/30 and was noted that the blister had no surrounding cellulitis. On 8/4 her home health nurse called to report she was having a reaction to the clindamycin - itchy rash on arms. She stopped the medication and took benadryl with improvement in the rash.  The erythema has worsened and spread beyond the original demarcation. She denies any new drainage. Endorses chills. She also reports polyuria and polydipsia x 1 week. Denies fevers, chest pain, shortness of breath, weakness, paresthesias.  Meds: Current Facility-Administered Medications  Medication Dose Route Frequency Provider Last Rate Last Dose  . 0.9 %  sodium  chloride infusion   Intravenous Continuous Blain Pais, MD      . acetaminophen (TYLENOL) tablet 650 mg  650 mg Oral Q6H PRN Blain Pais, MD       Or  . acetaminophen (TYLENOL) suppository 650 mg  650 mg Rectal Q6H PRN Blain Pais, MD      . Derrill Memo ON 07/27/2014] amLODipine (NORVASC) tablet 10 mg  10 mg Oral Daily Blain Pais, MD      . Derrill Memo ON 07/27/2014] citalopram (CELEXA) tablet 20 mg  20 mg Oral Daily Blain Pais, MD      . Derrill Memo ON 07/27/2014] losartan (COZAAR) tablet 100 mg  100 mg Oral Daily Blain Pais, MD       And  . Derrill Memo ON 07/27/2014] hydrochlorothiazide (HYDRODIURIL) tablet 25 mg  25 mg Oral Daily Blain Pais, MD      . HYDROcodone-acetaminophen (NORCO/VICODIN) 5-325 MG per tablet 1-2 tablet  1-2 tablet Oral Q4H PRN Blain Pais, MD      . insulin aspart (novoLOG) injection 0-9 Units  0-9 Units Subcutaneous 6 times per day Blain Pais, MD      . insulin glargine (LANTUS) injection 10 Units  10 Units Subcutaneous QHS Blain Pais, MD      . Derrill Memo ON 07/27/2014] multivitamin with minerals tablet 1 tablet  1 tablet Oral Daily Blain Pais, MD      .  ondansetron (ZOFRAN) tablet 4 mg  4 mg Oral Q6H PRN Blain Pais, MD       Or  . ondansetron (ZOFRAN) injection 4 mg  4 mg Intravenous Q6H PRN Blain Pais, MD      . Derrill Memo ON 07/27/2014] pantoprazole (PROTONIX) EC tablet 40 mg  40 mg Oral Daily Blain Pais, MD      . piperacillin-tazobactam (ZOSYN) IVPB 3.375 g  3.375 g Intravenous 3 times per day Carlyle Basques, MD      . Derrill Memo ON 07/27/2014] predniSONE (DELTASONE) tablet 15 mg  15 mg Oral Q breakfast Blain Pais, MD      . sodium chloride 0.9 % injection 3 mL  3 mL Intravenous Q12H Blain Pais, MD      . traMADol Veatrice Bourbon) tablet 50 mg  50 mg Oral Q6H PRN Blain Pais, MD      . vancomycin (VANCOCIN) 1,750 mg in sodium chloride 0.9 % 500 mL IVPB  1,750 mg  Intravenous Once Carlyle Basques, MD        Allergies: Allergies as of 07/26/2014 - Review Complete 07/26/2014  Allergen Reaction Noted  . Keflex [cephalexin] Itching and Swelling 08/19/2012  . Latex Itching 09/01/2012  . Sulfa antibiotics Rash 08/16/2012  . Sulfur Rash 08/16/2012   Past Medical History  Diagnosis Date  . Hypertension   . Pulmonary embolism     2011 treated at [facility] in [place].  Was on Coumadin for over a year.  no known family history  . UTI (urinary tract infection)   . High cholesterol   . 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 Depression   . Neuropathy     feet  . Kidney stones   . Pneumonia     "several times" (07/26/2014)  . Type II diabetes mellitus dx'd 2000  . Daily headache   . Migraine     "last one was in the 1990's" (07/26/2014)  . Arthritis     "knees" (07/26/2014)  . Chronic back pain    Past Surgical History  Procedure Laterality Date  . Cholecystectomy      1980  . Esophagogastroduodenoscopy  08/19/2012    Procedure: ESOPHAGOGASTRODUODENOSCOPY (EGD);  Surgeon: Winfield Cunas., MD;  Location: [facility] ENDOSCOPY;  Service: Endoscopy;  Laterality: N/A;  . Cesarean section  1982  . Carpal tunnel release Bilateral   . Tonsillectomy    . Artery biopsy Right 05/09/2014    Procedure: BIOPSY TEMPORAL ARTERY;  Surgeon: Rosetta Posner, MD;  Location: [facility];  Service: Vascular;  Laterality: Right;  . Appendectomy    . Vaginal hysterectomy    . Dilation and curettage of uterus  1982  . Cystoscopy w/ stone manipulation    . Breast biopsy Left   . Temporal artery biopsy / ligation Right 04/2014  . Eye surgery Bilateral   . Refractive surgery Bilateral    Family History  Problem Relation Age of Onset  . Adopted: Yes  . Hypertension Mother    History   Social History  . Marital Status: Single    Spouse Name: N/A    Number of Children: N/A  . Years of Education: N/A   Occupational History  . Not on file.   Social History Main Topics    . Smoking status: Current Every Day Smoker -- 1.00 packs/day for 44 years    Types: Cigarettes  . Smokeless tobacco: Never Used  . Alcohol Use:  No  . Drug Use: No  . Sexual Activity: Yes   Other Topics Concern  . Not on file   Social History Narrative   Previously divorced now engaged with her partner of 4 years.  Has 6 grown children with 21 grandchildren.  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  Moved to [place] in August 2013 and was transiently homeless until first part of October 2013.      Review of Systems: Constitutional: no fevers, +chills Eyes: no vision changes, +chronic blurry vision Ears, nose, mouth, throat, and face: no cough Respiratory: no shortness of breath Cardiovascular: no chest pain Gastrointestinal: no nausea/vomiting, no abdominal pain, no constipation, no diarrhea Genitourinary: no dysuria, no hematuria Integument: +erythema in LLE Hematologic/lymphatic: no bleeding/bruising, +bilateral LE edema Musculoskeletal: no myalgias Neurological: no paresthesias, no weakness  Physical Exam: Blood pressure 154/90, pulse 100, temperature 98.4 F (36.9 C), temperature source Oral, resp. rate 22, height 5\' 9"  (1.753 m), weight 275 lb 9.2 oz (125 kg), SpO2 96.00%. BP 186/109  Pulse 103  Temp(Src) 98.3 F (36.8 C) (Oral)  Resp 22  Ht 5\' 9"  (1.753 m)  Wt 275 lb 9.2 oz (125 kg)  BMI 40.68 kg/m2  SpO2 94%  General Appearance:    Alert, cooperative, no distress, appears stated age  Head:    Normocephalic, without obvious abnormality, atraumatic  Eyes:    Conjunctiva/corneas clear, EOM's intact  Ears:    Normal external ear canals  Nose:   Nares normal, septum midline, mucosa normal, no drainage  Throat:   Lips, mucosa, and tongue normal  Neck:   Supple, symmetrical, trachea midline  Back:     Symmetric, no curvature, ROM normal  Lungs:     Clear to auscultation bilaterally, no wheezes, respirations unlabored  Chest  Wall:    No tenderness or deformity   Heart:    Regular rate and rhythm, S1 and S2 normal  Abdomen:     Soft, nontender, nondistended  Extremities:   1+ pitting edema bilateral lower extremity to just below knees  Pulses:   2+ and symmetric all extremities  Skin:   Erythema LLE to mid shin. 4 x 3 cm ulceration at plantar aspect base of left great toe. Some clear weeping. No purulent drainage. Mild warmth. Tender to palpation.  Neurologic:   CNII-XII intact, normal strength, sensation throughout   Lab results: Basic Metabolic Panel:  Recent Labs  07/26/14 1830  NA 140  K 4.4  CL 103  CO2 26  GLUCOSE 338*  BUN 20  CREATININE 1.19*  CALCIUM 9.3   Liver Function Tests:  Recent Labs  07/26/14 1830  AST 14  ALT 42*  ALKPHOS 154*  BILITOT 0.2*  PROT 6.3  ALBUMIN 2.3*   CBC:  Recent Labs  07/26/14 1830  WBC 12.4*  NEUTROABS 10.3*  HGB 11.5*  HCT 36.5  MCV 89.0  PLT 249   CBG:  Recent Labs  07/26/14 1622 07/26/14 1743  GLUCAP 323* 391*   Hemoglobin A1C:  Recent Labs  07/26/14 1629  HGBA1C 13.4   Coagulation:  Recent Labs  07/26/14 1830  LABPROT 15.9*  INR 1.27   Urine Drug Screen: Drugs of Abuse     Component Value Date/Time   LABOPIA POSITIVE* 10/12/2012 0803   COCAINSCRNUR NONE DETECTED 10/12/2012 0803   LABBENZ NONE DETECTED 10/12/2012 0803   AMPHETMU NONE DETECTED 10/12/2012 0803   THCU NONE DETECTED 10/12/2012 0803   LABBARB NONE  DETECTED 10/12/2012 0803     Imaging results:  No results found.   Assessment & Plan by Problem:  Nonhealing foot wound: likely 2/2 diabetic neuropathy. Healing is compromised by tobacco use, prednisone use, and DM. She did not complete her course of antibiotics - clindamycin 450mg  QID x 10 days (took about 7 days). Failed to improve and cellulitis expanded. Osteomyelitis is on the differential. -MRI left lower extremity -follow up blood culture -IV vanc and zosyn -Norco prn pain -Wound care  consult  Uncontrolled DM: hgb a1c 13.4 07/26/2014. On metformin and insulin at home. -Hold metformin and insulin NPH-regular -Insulin glargine 10u QHS -SSI  HTN -continue amlodipine 10mg  -continue losartan 100mg  daily and HCTZ 25mg  daily  Recurrent PE: in 2011 and 2013. Restarted on warfarin. 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 Anticipated discharge in approximately 1 day(s).   The patient does have a current PCP Jessee Avers, MD) and does need an [facility] hospital follow-up appointment after discharge.  The patient does not know have transportation limitations that hinder transportation to clinic appointments.  Signed: Jacques Earthly, MD 07/26/2014, 7:59 PM

## 2014-07-26 NOTE — Assessment & Plan Note (Addendum)
Could be 2/2 prednisone On diuretic HCTZ, encourage leg elevation

## 2014-07-26 NOTE — Progress Notes (Signed)
   Subjective:    Patient ID: Joy Butler, female    DOB: [birth year], 63 y.o.   MRN: [id number]  HPI Comments: 63 y.o h/o recurrent PE on coumadin (subtherapeutic), HTN (BP 150/89), uncontrolled DM 2 (HA1C 13.4 today)  She presents for recheck left foot wound 1. Left foot wound has been there like 10 days per pt. She denies pain but there is drainage and b/l feet are swelling.  She took Clindamycin x 10-11 d then stopped taking them.  Her home health RN states she was concerned wound is not healing and area is more red.  Pt denies fever but was cold today and yesterday.Pt did not follow up with ortho as instructed due to transportation issues.    2. H/o temporal arteritis-on Prednisone still taking but concerned about weight gain.         Review of Systems  Constitutional: Positive for chills and unexpected weight change. Negative for fever.  Cardiovascular: Positive for leg swelling.       Objective:   Physical Exam  Nursing note and vitals reviewed. Constitutional: She is oriented to person, place, and time. She appears well-developed and well-nourished. She is cooperative. No distress.  HENT:  Head: Normocephalic and atraumatic.  Mouth/Throat: Oropharynx is clear and moist and mucous membranes are normal. No oropharyngeal exudate.  Eyes: Conjunctivae are normal. Pupils are equal, round, and reactive to light. Right eye exhibits no discharge. Left eye exhibits no discharge. No scleral icterus.  Cardiovascular: Normal rate, regular rhythm, S1 normal, S2 normal and normal heart sounds.   No murmur heard. 2+ lower ext edema b/l   Pulmonary/Chest: Effort normal and breath sounds normal. No respiratory distress. She has no wheezes.  Abdominal: Soft. Bowel sounds are normal. There is no tenderness.  Neurological: She is alert and oriented to person, place, and time.  In wheelchair   Skin: Skin is warm and dry. She is not diaphoretic.     Psychiatric: She has a normal mood and  affect. Her speech is normal and behavior is normal. Judgment and thought content normal. Cognition and memory are normal.          Assessment & Plan:  Will admit to hospital med surg.  for MRI of left foot to r/o OM, CMET, CBC, INR, start broad coverage ab with Vanc/Zosyn, consult ortho for possible debridement

## 2014-07-26 NOTE — Progress Notes (Unsigned)
Patient ID: Joy Butler, female   DOB: 12-25-1950, 63 y.o.   MRN: [id number] REPORT CALLED TO [id number], TRANSPORTED VIA [place]

## 2014-07-26 NOTE — Progress Notes (Signed)
Paged IV team, attempted to start IV x2 unsuccessful.

## 2014-07-26 NOTE — Patient Instructions (Signed)

## 2014-07-26 NOTE — Telephone Encounter (Signed)
Thanks Helen  

## 2014-07-26 NOTE — Telephone Encounter (Signed)
Pt's HHN, barbara calls and states pt's phone is broken so she was unable to be told about her appt yesterday to see someone concerning her L great toe wound.  Wound is being treated at present w/ neosporin and a bandaid It is appr 4cm x 3.5cm on the plantar aspect of the L great toe It now has black and green coloring, does not state if it is draining or not  Pt was seen in clinic 7/30 for this problem by dr Eyvonne Mechanic, [facility] states it is much worse since that visit  Blood sugars are well over 300  appt is made for this afternoon at 1545 w/ dr Aundra Dubin,  if pt can find transportation if not she will be seen tomorrow in clinic at 1345 dr gill

## 2014-07-26 NOTE — Assessment & Plan Note (Addendum)
Will admit to med surg for DM foot wound/cellulitis not healing and resistant to tx with Clindamycin Obtain MRI of left foot to r/o OM, CMET, CBC, start broad coverage ab with Vanc/Zosyn, consult ortho for possible debridement  Uncontrolled DM 2 HA1C 13.4 today likely 2/2 med N/C Will need to be addressed at f/u  Pt to continue Novolin 45 units bid, Metformin 500 mg bid

## 2014-07-27 ENCOUNTER — Ambulatory Visit: Payer: Medicare Other | Admitting: Internal Medicine

## 2014-07-27 LAB — BASIC METABOLIC PANEL
Anion gap: 11 (ref 5–15)
BUN: 17 mg/dL (ref 6–23)
CO2: 26 mEq/L (ref 19–32)
Calcium: 9 mg/dL (ref 8.4–10.5)
Chloride: 106 mEq/L (ref 96–112)
Creatinine, Ser: 1.06 mg/dL (ref 0.50–1.10)
GFR calc Af Amer: 63 mL/min — ABNORMAL LOW (ref >90)
GFR calc non Af Amer: 54 mL/min — ABNORMAL LOW (ref >90)
Glucose, Bld: 251 mg/dL — ABNORMAL HIGH (ref 70–99)
Potassium: 4.5 mEq/L (ref 3.7–5.3)
Sodium: 143 mEq/L (ref 137–147)

## 2014-07-27 LAB — CBC
HCT: 34.4 % — ABNORMAL LOW (ref 36.0–46.0)
Hemoglobin: 10.9 g/dL — ABNORMAL LOW (ref 12.0–15.0)
MCH: 28.3 pg (ref 26.0–34.0)
MCHC: 31.7 g/dL (ref 30.0–36.0)
MCV: 89.4 fL (ref 78.0–100.0)
Platelets: 247 10*3/uL (ref 150–400)
RBC: 3.85 MIL/uL — AB (ref 3.87–5.11)
RDW: 17.6 % — AB (ref 11.5–15.5)
WBC: 12.6 10*3/uL — ABNORMAL HIGH (ref 4.0–10.5)

## 2014-07-27 LAB — GLUCOSE, CAPILLARY
GLUCOSE-CAPILLARY: 210 mg/dL — AB (ref 70–99)
GLUCOSE-CAPILLARY: 221 mg/dL — AB (ref 70–99)
Glucose-Capillary: 305 mg/dL — ABNORMAL HIGH (ref 70–99)
Glucose-Capillary: 372 mg/dL — ABNORMAL HIGH (ref 70–99)

## 2014-07-27 LAB — PROTIME-INR
INR: 1.22 (ref 0.00–1.49)
Prothrombin Time: 15.4 seconds — ABNORMAL HIGH (ref 11.6–15.2)

## 2014-07-27 LAB — HIV ANTIBODY (ROUTINE TESTING W REFLEX): HIV: NONREACTIVE

## 2014-07-27 MED ORDER — INSULIN ASPART 100 UNIT/ML ~~LOC~~ SOLN
0.0000 [IU] | Freq: Three times a day (TID) | SUBCUTANEOUS | Status: DC
  Administered 2014-07-27: 20 [IU] via SUBCUTANEOUS
  Administered 2014-07-28: 4 [IU] via SUBCUTANEOUS
  Administered 2014-07-28 – 2014-07-29 (×2): 11 [IU] via SUBCUTANEOUS
  Administered 2014-07-29: 12:00:00 via SUBCUTANEOUS
  Administered 2014-07-29: 3 [IU] via SUBCUTANEOUS
  Administered 2014-07-30 (×2): 7 [IU] via SUBCUTANEOUS
  Administered 2014-07-30: 20 [IU] via SUBCUTANEOUS

## 2014-07-27 MED ORDER — WARFARIN SODIUM 7.5 MG PO TABS
15.0000 mg | ORAL_TABLET | Freq: Once | ORAL | Status: AC
  Administered 2014-07-27: 15 mg via ORAL
  Filled 2014-07-27: qty 2

## 2014-07-27 MED ORDER — INSULIN GLARGINE 100 UNIT/ML ~~LOC~~ SOLN
20.0000 [IU] | Freq: Every day | SUBCUTANEOUS | Status: DC
  Administered 2014-07-27 – 2014-07-29 (×3): 20 [IU] via SUBCUTANEOUS
  Filled 2014-07-27 (×6): qty 0.2

## 2014-07-27 MED ORDER — VANCOMYCIN HCL 10 G IV SOLR
1250.0000 mg | Freq: Two times a day (BID) | INTRAVENOUS | Status: DC
  Administered 2014-07-27 – 2014-07-29 (×4): 1250 mg via INTRAVENOUS
  Filled 2014-07-27 (×5): qty 1250

## 2014-07-27 MED ORDER — COLLAGENASE 250 UNIT/GM EX OINT
TOPICAL_OINTMENT | Freq: Every day | CUTANEOUS | Status: DC
  Administered 2014-07-27 – 2014-07-30 (×3): via TOPICAL
  Filled 2014-07-27: qty 30

## 2014-07-27 MED ORDER — FUROSEMIDE 20 MG PO TABS
20.0000 mg | ORAL_TABLET | Freq: Once | ORAL | Status: AC
  Administered 2014-07-27: 20 mg via ORAL
  Filled 2014-07-27: qty 1

## 2014-07-27 NOTE — Progress Notes (Signed)
Subjective:  Patient has some swelling on both legs today. Didn't sleep well last night. Continues to have pain on the left foot.   Denies sob/cp/n/v/fever/chills.  Objective: Vital signs in last 24 hours: Filed Vitals:   07/26/14 1745 07/26/14 2043 07/27/14 0621 07/27/14 0900  BP: 154/90 186/109 133/71 179/80  Pulse: 100 103 80 80  Temp: 98.4 F (36.9 C) 98.3 F (36.8 C) 98.2 F (36.8 C) 98.4 F (36.9 C)  TempSrc: Oral Oral Oral Oral  Resp: 22 22 20 20   Height: 5\' 9"  (1.753 m) 5\' 9"  (1.753 m)    Weight: 125 kg (275 lb 9.2 oz) 125 kg (275 lb 9.2 oz)    SpO2: 96% 94% 93% 95%   Weight change:   Intake/Output Summary (Last 24 hours) at 07/27/14 1459 Last data filed at 07/27/14 0865  Gross per 24 hour  Intake    960 ml  Output      0 ml  Net    960 ml   Physical exam Vitals reviewed. General: resting, NAD HEENT: PERRL, EOMI, no scleral icterus Cardiac: RRR, no rubs, murmurs or gallops Pulm: clear to auscultation bilaterally, no wheezes, rales, or rhonchi Abd: soft, nontender, nondistended, BS present Ext: has 2+ edema upto her knees L>R. Left foot has erythema, swelling. No warmth felt. Plantar surface of left great toe has an opening without any discharge.  Neuro: alert and oriented X3, cranial nerves II-XII grossly intact, strength and sensation to light touch equal in bilateral upper and lower extremities  Lab Results: Basic Metabolic Panel:  Recent Labs Lab 07/26/14 1830 07/27/14 0557  NA 140 143  K 4.4 4.5  CL 103 106  CO2 26 26  GLUCOSE 338* 251*  BUN 20 17  CREATININE 1.19* 1.06  CALCIUM 9.3 9.0   Liver Function Tests:  Recent Labs Lab 07/26/14 1830  AST 14  ALT 42*  ALKPHOS 154*  BILITOT 0.2*  PROT 6.3  ALBUMIN 2.3*  CBC:  Recent Labs Lab 07/26/14 1830 07/27/14 0557  WBC 12.4* 12.6*  NEUTROABS 10.3*  --   HGB 11.5* 10.9*  HCT 36.5 34.4*  MCV 89.0 89.4  PLT 249 247  CBG:  Recent Labs Lab 07/26/14 1622 07/26/14 1743  07/26/14 2048 07/27/14 0405 07/27/14 0728 07/27/14 1150  GLUCAP 323* 391* 327* 305* 210* 221*   Hemoglobin A1C:  Recent Labs Lab 07/26/14 1629  HGBA1C 13.4   Coagulation:  Recent Labs Lab 07/26/14 1830 07/27/14 1110  LABPROT 15.9* 15.4*  INR 1.27 1.22  Urine Drug Screen: Drugs of Abuse     Component Value Date/Time   LABOPIA POSITIVE* 10/12/2012 0803   COCAINSCRNUR NONE DETECTED 10/12/2012 0803   LABBENZ NONE DETECTED 10/12/2012 0803   AMPHETMU NONE DETECTED 10/12/2012 0803   THCU NONE DETECTED 10/12/2012 0803   LABBARB NONE DETECTED 10/12/2012 0803    Micro Results: Recent Results (from the past 240 hour(s))  CULTURE, BLOOD (ROUTINE X 2)     Status: None   Collection Time    07/26/14  6:30 PM      Result Value Ref Range Status   Specimen Description BLOOD RIGHT ARM   Final   Special Requests BOTTLES DRAWN AEROBIC AND ANAEROBIC 5CC   Final   Culture  Setup Time     Final   Value: 07/26/2014 21:38     Performed at [facility]   Culture     Final   Value:        BLOOD CULTURE  RECEIVED NO GROWTH TO DATE CULTURE WILL BE HELD FOR 5 DAYS BEFORE ISSUING A FINAL NEGATIVE REPORT     Performed at [facility]   Report Status PENDING   Incomplete  CULTURE, BLOOD (ROUTINE X 2)     Status: None   Collection Time    07/26/14  6:40 PM      Result Value Ref Range Status   Specimen Description BLOOD RIGHT HAND   Final   Special Requests BOTTLES DRAWN AEROBIC AND ANAEROBIC 5CC   Final   Culture  Setup Time     Final   Value: 07/26/2014 21:38     Performed at [facility]   Culture     Final   Value:        BLOOD CULTURE RECEIVED NO GROWTH TO DATE CULTURE WILL BE HELD FOR 5 DAYS BEFORE ISSUING A FINAL NEGATIVE REPORT     Performed at [facility]   Report Status PENDING   Incomplete   Studies/Results: Mr Foot Left W Wo Contrast  07/27/2014   CLINICAL DATA:  Diabetic foot ulcer of the left foot.  EXAM: MRI OF THE LEFT FOREFOOT WITHOUT AND  WITH CONTRAST  TECHNIQUE: Multiplanar, multisequence MR imaging was performed both before and after administration of intravenous contrast.  CONTRAST:  31mL MULTIHANCE GADOBENATE DIMEGLUMINE 529 MG/ML IV SOLN  COMPARISON:  None.  FINDINGS: No focal marrow edema. No fracture or dislocation. No T1 marrow signal abnormality. No bone destruction or periostitis.  The visualized flexor, extensor and peroneal tendons are intact.  There is generalized soft tissue edema along the dorsal aspect of the foot. There is a soft tissue ulcer along the plantar medial aspect of the knee left foot of the level of the first metatarsal head with enhancement. There is no associated fluid collection. The signal abnormality is isolated to the subcutaneous fat and overlying skin.  There is no other area of abnormal enhancement. The vasculature is slightly increased in signal consistent with the patient's history of diabetes.  IMPRESSION: 1. No evidence of osteomyelitis of the left foot. 2. Focal area of soft tissue thickening and enhancement along the plantar medial aspect of the left foot at the level of the first metatarsal head consistent with known nonhealing ulcer. No fluid collection or hematoma.   Electronically Signed   By: Kathreen Devoid   On: 07/27/2014 08:24   Medications: I have reviewed the patient's current medications. Scheduled Meds: . amLODipine  10 mg Oral Daily  . citalopram  20 mg Oral Daily  . collagenase   Topical Daily  . furosemide  20 mg Oral Once  . losartan  100 mg Oral Daily   And  . hydrochlorothiazide  25 mg Oral Daily  . insulin aspart  0-20 Units Subcutaneous TID WC  . insulin glargine  10 Units Subcutaneous QHS  . multivitamin with minerals  1 tablet Oral Daily  . nicotine  14 mg Transdermal Daily  . pantoprazole  40 mg Oral Daily  . piperacillin-tazobactam (ZOSYN)  IV  3.375 g Intravenous 3 times per day  . predniSONE  15 mg Oral Q breakfast  . sodium chloride  3 mL Intravenous Q12H  .  vancomycin  1,250 mg Intravenous Q12H  . warfarin  15 mg Oral ONCE-1800  . Warfarin - Pharmacist Dosing Inpatient   Does not apply q1800   Continuous Infusions:  PRN Meds:.acetaminophen, acetaminophen, HYDROcodone-acetaminophen, ondansetron (ZOFRAN) IV, ondansetron, traMADol Assessment/Plan: Principal Problem:   Diabetic foot ulcer  Active Problems:   HTN (hypertension)   DM (diabetes mellitus), type 2   Long term (current) use of anticoagulants   Temporal arteritis  Nonhealing diabetic foot ulcer  - took clinda for 8 days then developed rxn and stopped, cellulitis worsened after that. - MRI negative for osteo.  - [place]. - was on vanc+zosyn. Changing to ceftriaxone today.  - Norco PRN pain - wound care consulted: santyl ointment to chemically debrinve nonviable tissue. [facility] after discharge.  - will give her 20 PO lasix 1x to help her with BLE edema. Check BMP tomorrow since she is lasix naive.   DM - uncontrolled - hga1c 13.5 - on prednisone. CBG has been running high 200's.  - switching SSI from mod --> resistant  - currently on lantus 10 here. At home takes 45 BID. Will adjust lantus dose as needed.  Temporal arteritis  - continue prednisone 15 mg (is on long taper)  HTN - cont amlodopine 10mg  + losartan 100mg  daily and HCTZ 25mg  daily.  Depression  - continue home citalopram  Recurrent PE  On 2011 and 2013 - INR subtherapeutic. Coumadin per pharmacy.  Code: full Diet: carb PPX: coumadin.   Dispo: Disposition is deferred at this time, awaiting improvement of current medical problems.  Anticipated discharge in approximately 2-3 day(s).   The patient does have a current PCP Jessee Avers, MD) and does need an [facility] hospital follow-up appointment after discharge.  The patient does not know have transportation limitations that hinder transportation to clinic appointments.  .Services Needed at time of discharge: Y = Yes, Blank =  No PT:   OT:   RN:   Equipment:   Other:     LOS: 1 day   Dellia Nims, MD 07/27/2014, 2:59 PM

## 2014-07-27 NOTE — Progress Notes (Signed)
Inpatient Diabetes Program Recommendations  AACE/ADA: New Consensus Statement on Inpatient Glycemic Control (2013)  Target Ranges:  Prepandial:   less than 140 mg/dL      Peak postprandial:   less than 180 mg/dL (1-2 hours)      Critically ill patients:  140 - 180 mg/dL   Reason for Assessment:  Results for Joy Butler, Joy Butler (MRN [id number]) as of 07/27/2014 12:39  Ref. Range 07/26/2014 17:43 07/26/2014 20:48 07/27/2014 04:05 07/27/2014 07:28 07/27/2014 11:50  Glucose-Capillary Latest Range: 70-99 mg/dL 391 (H) 327 (H) 305 (H) 210 (H) 221 (H)   Diabetes history: Type 2 diabetes Outpatient Diabetes medications: Novolin 70/30 45 units bid, and Metformin 500 mg bid Current orders for Inpatient glycemic control:  Lantus 10 units daily, Novolog moderate tid with  Meals  Agree with discontinuation of 70/30 while patient is in the hospital.  Based on home dose of 70/30 and patient's weight, consider increasing Lantus to 25 units daily (0.2 units/kg) and add Novolog meal coverage 6 units tid with meals (Hold if patient eats less than 50%).   Thanks, Adah Perl, RN, BC-ADM Inpatient Diabetes Coordinator Pager [phone]

## 2014-07-27 NOTE — H&P (Signed)
  Date: 07/27/2014  Patient name: Joy Butler  Medical record number: [id number]  Date of birth: Dec 04, 1951   I have seen and evaluated Joy Butler and discussed their care with the Residency Team.   Assessment and Plan: I have seen and evaluated the patient as outlined above. I agree with the formulated Assessment and Plan as detailed in the residents' admission note, with the following changes:   1. For her cellulitis, will keep her on vancomycin due to her various antibiotic allergies. Will likely have wound care and outpatient wound care referral done for discharge  Carlyle Basques, MD 8/7/201510:16 PM

## 2014-07-27 NOTE — Progress Notes (Signed)
Utilization review completed.  

## 2014-07-27 NOTE — Progress Notes (Signed)
ANTICOAGULATION CONSULT NOTE - Follow Up Consult  Pharmacy Consult for Coumadin Indication: Hx of recurrent PE  Allergies  Allergen Reactions  . Keflex [Cephalexin] Itching and Swelling    Lips and eyes swelled up  . Latex Itching  . Sulfa Antibiotics Rash  . Sulfur Rash    Patient Measurements: Height: 5\' 9"  (175.3 cm) Weight: 275 lb 9.2 oz (125 kg) IBW/kg (Calculated) : 66.2  Vital Signs: Temp: 98.4 F (36.9 C) (08/07 0900) Temp src: Oral (08/07 0900) BP: 179/80 mmHg (08/07 0900) Pulse Rate: 80 (08/07 0900)  Labs:  Recent Labs  07/26/14 1830 07/27/14 0557 07/27/14 1110  HGB 11.5* 10.9*  --   HCT 36.5 34.4*  --   PLT 249 247  --   LABPROT 15.9*  --  15.4*  INR 1.27  --  1.22  CREATININE 1.19* 1.06  --     Estimated Creatinine Clearance: 75.9 ml/min (by C-G formula based on Cr of 1.06).  Assessment: 60 YOF on chronic coumadin for recurrent PE, INR remains subtherapeutic, CBC stable. No bleeding noted per chart.  PTA dose: 12.5 mg on Monday and Thursdays, 10 mg on all other days, but INR 1.27 on admission.   Goal of Therapy:  INR 2-3 Monitor platelets by anticoagulation protocol: Yes   Plan:  - Repeat Coumadin 15 mg po x 1 tonight - f/u daily PT/INR  Maryanna Shape, PharmD, BCPS  Clinical Pharmacist  Pager: [phone]   07/27/2014,12:57 PM

## 2014-07-27 NOTE — Consult Note (Addendum)
WOC wound consult note Reason for Consult: Consult requested for left foot wound.  Pt states it has been present several weeks and began as a blister which ruptured and evolved.  MRI does not show osteomyelitis. Pt has been started on Vancomycin and previously marked area of cellulitis is beginning to recede.  Wound type: Full thickness wound to plantar foot near posterior great toe.  Measurement: 4X4cm Wound bed: 10% red, 90% yellow tightly adhered slough Drainage (amount, consistency, odor) Small amt yellow drainage, no odor. Periwound: Dry peeling skin surrounding wound bed.  Left great toe remains darker-colored than rest of skin and great toe and left foot with generalized edema. Dressing procedure/placement/frequency: Santyl ointment to chemically debride nonviable tissue.  Pt could benefit from follow-up at the outpatient wound care center after discharge for debridement after slough softens.  This must be by physician referral; please order if desired upon discharge from the hospital. Please re-consult if further assistance is needed.  Thank-you,  Julien Girt MSN, [place], [place], [place], [place]

## 2014-07-27 NOTE — Progress Notes (Addendum)
Spoke with patient about diabetes and home regimen for diabetes control. Patient reports that she is followed by her PCP for diabetes management and currently she takes 70/30 45 units BID and Metformin 500 mg BID as an outpatient for diabetes control.  Inquired about knowledge about A1C and patient reports that she does not know what an A1C is exactly but she knows her doctor checks it. Discussed A1C results (13.4% on 07/26/14/15) and explained what an A1C is, basic pathophysiology of DM Type 2, basic home care, importance of checking CBGs and maintaining good CBG control to prevent long-term and short-term complications. Discussed impact of nutrition, exercise, stress, sickness, and medications on diabetes control.  Patient reports that she checks her blood glucose 2-3 times per day and that over the past 3-4 weeks her glucose has been running high (over 400 mg/dl) most of the time. Stressed importance of better glycemic control to decrease risk of complications related to uncontrolled diabetes. Inquired about diet and patient reports that she does not really follow a diabetic diet. In talking with the patient further, she states that she has poor vision so she can not read labels and she has to depend of others to do grocery shopping.   Discussed carbohydrates, food groups high in carbohydrates, carbohydrate goals per day and meal, along with portion sizes.   Encouraged patient to make an appointment with Debera Lat, CDE for Internal Medicine group for outpatient diabetes education. Patient verbalized understanding of information discussed and she states that she has no further questions at this time related to diabetes.   In talking with the patient she reports that she feels hopeless, depressed, and frustrated at times due to her inability to do things for herself. Patient scored 12 on PHQ-9 screening; therefore, consulted Education officer, museum.  While inpatient, 70/30 insulin is being held and Lantus is being  used. If glycemic control is improved with Lantus, MD may want to consider switching outpatient basal insulin to Lantus. Please see Diabetes Coordinator note dated 07/27/14 by Adah Perl, RN, BCADM with recommendations to increase Lantus to 25 units daily (0.2 units/kg) and add Novolog meal coverage 6 units tid with meals (Hold if patient eats less than 50%).    Thanks, Barnie Alderman, RN, MSN, CCRN Diabetes Coordinator Inpatient Diabetes Program [phone] (Team Pager) [phone] ([facility] office) [phone] [facility] office)

## 2014-07-28 DIAGNOSIS — F3289 Other specified depressive episodes: Secondary | ICD-10-CM

## 2014-07-28 DIAGNOSIS — IMO0002 Reserved for concepts with insufficient information to code with codable children: Principal | ICD-10-CM

## 2014-07-28 DIAGNOSIS — L97509 Non-pressure chronic ulcer of other part of unspecified foot with unspecified severity: Secondary | ICD-10-CM

## 2014-07-28 DIAGNOSIS — E1165 Type 2 diabetes mellitus with hyperglycemia: Principal | ICD-10-CM

## 2014-07-28 DIAGNOSIS — M316 Other giant cell arteritis: Secondary | ICD-10-CM

## 2014-07-28 DIAGNOSIS — E1169 Type 2 diabetes mellitus with other specified complication: Principal | ICD-10-CM

## 2014-07-28 DIAGNOSIS — Z86711 Personal history of pulmonary embolism: Secondary | ICD-10-CM

## 2014-07-28 DIAGNOSIS — I10 Essential (primary) hypertension: Secondary | ICD-10-CM

## 2014-07-28 DIAGNOSIS — L03119 Cellulitis of unspecified part of limb: Secondary | ICD-10-CM

## 2014-07-28 DIAGNOSIS — F329 Major depressive disorder, single episode, unspecified: Secondary | ICD-10-CM

## 2014-07-28 DIAGNOSIS — L02619 Cutaneous abscess of unspecified foot: Secondary | ICD-10-CM

## 2014-07-28 LAB — BASIC METABOLIC PANEL
Anion gap: 12 (ref 5–15)
BUN: 16 mg/dL (ref 6–23)
CHLORIDE: 102 meq/L (ref 96–112)
CO2: 27 meq/L (ref 19–32)
CREATININE: 1.28 mg/dL — AB (ref 0.50–1.10)
Calcium: 8.8 mg/dL (ref 8.4–10.5)
GFR calc Af Amer: 50 mL/min — ABNORMAL LOW (ref >90)
GFR calc non Af Amer: 43 mL/min — ABNORMAL LOW (ref >90)
Glucose, Bld: 257 mg/dL — ABNORMAL HIGH (ref 70–99)
Potassium: 4.2 mEq/L (ref 3.7–5.3)
SODIUM: 141 meq/L (ref 137–147)

## 2014-07-28 LAB — PROTIME-INR
INR: 1.38 (ref 0.00–1.49)
Prothrombin Time: 17 seconds — ABNORMAL HIGH (ref 11.6–15.2)

## 2014-07-28 LAB — GLUCOSE, CAPILLARY
GLUCOSE-CAPILLARY: 358 mg/dL — AB (ref 70–99)
Glucose-Capillary: 311 mg/dL — ABNORMAL HIGH (ref 70–99)
Glucose-Capillary: 210 mg/dL — ABNORMAL HIGH (ref 70–99)
Glucose-Capillary: 167 mg/dL — ABNORMAL HIGH (ref 70–99)
Glucose-Capillary: 264 mg/dL — ABNORMAL HIGH (ref 70–99)

## 2014-07-28 MED ORDER — WARFARIN SODIUM 7.5 MG PO TABS
15.0000 mg | ORAL_TABLET | Freq: Once | ORAL | Status: AC
  Administered 2014-07-28: 15 mg via ORAL
  Filled 2014-07-28: qty 2

## 2014-07-28 NOTE — Progress Notes (Signed)
ANTICOAGULATION & ANTIBIOTIC CONSULT NOTE - Follow Up Consult  Pharmacy Consult for Coumadin; Vancomycin  Indication: Hx of recurrent PE; Non-healing diabetic foot ulcer   Allergies  Allergen Reactions  . Keflex [Cephalexin] Itching and Swelling    Lips and eyes swelled up  . Clindamycin/Lincomycin Itching  . Latex Itching  . Sulfa Antibiotics Rash  . Sulfur Rash    Patient Measurements: Height: 5\' 9"  (175.3 cm) Weight: 275 lb 8 oz (124.966 kg) IBW/kg (Calculated) : 66.2  Vital Signs: Temp: 98.6 F (37 C) (08/08 0948) Temp src: Oral (08/08 0948) BP: 142/88 mmHg (08/08 0948) Pulse Rate: 88 (08/08 0948)  Labs:  Recent Labs  07/26/14 1830 07/27/14 0557 07/27/14 1110 07/28/14 0415  HGB 11.5* 10.9*  --   --   HCT 36.5 34.4*  --   --   PLT 249 247  --   --   LABPROT 15.9*  --  15.4* 17.0*  INR 1.27  --  1.22 1.38  CREATININE 1.19* 1.06  --  1.28*    Estimated Creatinine Clearance: 62.9 ml/min (by C-G formula based on Cr of 1.28).  Assessment: AC: 69 YOF on chronic coumadin for recurrent PE, INR remains subtherapeutic but trended up to 1.38. No bleeding noted per chart.  PTA dose: 12.5 mg on Monday and Thursdays, 10 mg on all other days, but INR 1.27 on admission.   ID: Pt is currently on Vancomycin for non-healing diabetic foot ulcer. MRI negative for osteo. BCx NGTD.  Goal of Therapy:  INR 2-3 Monitor platelets by anticoagulation protocol: Yes   Plan:  - Repeat Coumadin 15 mg po x 1 tonight - f/u daily PT/INR - Continue Vancomycin 1250 mg IV Q 12 hours; given allergy to Keflex, unable to narrow down to cephalosporin  - F/u 2130 VT today   Albertina Parr, PharmD.  Clinical Pharmacist Pager [phone]

## 2014-07-28 NOTE — Progress Notes (Signed)
Subjective: No overnight events, she remains afebrile. Her left plantar great toe is still painful at times but the pain and swelling are improving.   Objective: Vital signs in last 24 hours: Filed Vitals:   07/27/14 2131 07/28/14 0415 07/28/14 0948 07/28/14 1801  BP: 162/82 163/93 142/88 151/87  Pulse: 90 86 88 81  Temp: 99 F (37.2 C) 98.2 F (36.8 C) 98.6 F (37 C) 98.6 F (37 C)  TempSrc: Oral Oral Oral Oral  Resp: 17 18 16 16   Height:      Weight: 275 lb 8 oz (124.966 kg)     SpO2: 95% 94% 92% 91%   Weight change: -1.2 oz (-0.034 kg)  Intake/Output Summary (Last 24 hours) at 07/28/14 1832 Last data filed at 07/28/14 0900  Gross per 24 hour  Intake   1280 ml  Output      0 ml  Net   1280 ml  Vitals reviewed.  General: resting, NAD  HEENT: no scleral icterus  Cardiac: RRR, no rubs, murmurs or gallops  Pulm: clear to auscultation bilaterally, no wheezes, rales, or rhonchi  Abd: soft, nontender, nondistended, BS present  Ext: has 1+ edema upto her knees L>R. Left foot has erythema, swelling. No warmth felt. Plantar surface of left great toe has an opening without any discharge.  Neuro: alert and oriented X3, moves all extremities voluntairly   Lab Results: Basic Metabolic Panel:  Recent Labs Lab 07/27/14 0557 07/28/14 0415  NA 143 141  K 4.5 4.2  CL 106 102  CO2 26 27  GLUCOSE 251* 257*  BUN 17 16  CREATININE 1.06 1.28*  CALCIUM 9.0 8.8   Liver Function Tests:  Recent Labs Lab 07/26/14 1830  AST 14  ALT 42*  ALKPHOS 154*  BILITOT 0.2*  PROT 6.3  ALBUMIN 2.3*   CBC:  Recent Labs Lab 07/26/14 1830 07/27/14 0557  WBC 12.4* 12.6*  NEUTROABS 10.3*  --   HGB 11.5* 10.9*  HCT 36.5 34.4*  MCV 89.0 89.4  PLT 249 247   CBG:  Recent Labs Lab 07/27/14 1150 07/27/14 1648 07/27/14 2123 07/28/14 0735 07/28/14 1122 07/28/14 1622  GLUCAP 221* 372* 311* 210* 167* 264*   Hemoglobin A1C:  Recent Labs Lab 07/26/14 1629  HGBA1C 13.4    Coagulation:  Recent Labs Lab 07/26/14 1830 07/27/14 1110 07/28/14 0415  LABPROT 15.9* 15.4* 17.0*  INR 1.27 1.22 1.38   Urine Drug Screen: Drugs of Abuse     Component Value Date/Time   LABOPIA POSITIVE* 10/12/2012 0803   COCAINSCRNUR NONE DETECTED 10/12/2012 0803   LABBENZ NONE DETECTED 10/12/2012 0803   AMPHETMU NONE DETECTED 10/12/2012 0803   THCU NONE DETECTED 10/12/2012 0803   LABBARB NONE DETECTED 10/12/2012 0803     Micro Results: Recent Results (from the past 240 hour(s))  CULTURE, BLOOD (ROUTINE X 2)     Status: None   Collection Time    07/26/14  6:30 PM      Result Value Ref Range Status   Specimen Description BLOOD RIGHT ARM   Final   Special Requests BOTTLES DRAWN AEROBIC AND ANAEROBIC 5CC   Final   Culture  Setup Time     Final   Value: 07/26/2014 21:38     Performed at [facility]   Culture     Final   Value:        BLOOD CULTURE RECEIVED NO GROWTH TO DATE CULTURE WILL BE HELD FOR 5 DAYS BEFORE ISSUING A FINAL  NEGATIVE REPORT     Performed at [facility]   Report Status PENDING   Incomplete  CULTURE, BLOOD (ROUTINE X 2)     Status: None   Collection Time    07/26/14  6:40 PM      Result Value Ref Range Status   Specimen Description BLOOD RIGHT HAND   Final   Special Requests BOTTLES DRAWN AEROBIC AND ANAEROBIC 5CC   Final   Culture  Setup Time     Final   Value: 07/26/2014 21:38     Performed at [facility]   Culture     Final   Value:        BLOOD CULTURE RECEIVED NO GROWTH TO DATE CULTURE WILL BE HELD FOR 5 DAYS BEFORE ISSUING A FINAL NEGATIVE REPORT     Performed at [facility]   Report Status PENDING   Incomplete   Studies/Results: Mr Foot Left W Wo Contrast  07/27/2014   CLINICAL DATA:  Diabetic foot ulcer of the left foot.  EXAM: MRI OF THE LEFT FOREFOOT WITHOUT AND WITH CONTRAST  TECHNIQUE: Multiplanar, multisequence MR imaging was performed both before and after administration of intravenous contrast.   CONTRAST:  59mL MULTIHANCE GADOBENATE DIMEGLUMINE 529 MG/ML IV SOLN  COMPARISON:  None.  FINDINGS: No focal marrow edema. No fracture or dislocation. No T1 marrow signal abnormality. No bone destruction or periostitis.  The visualized flexor, extensor and peroneal tendons are intact.  There is generalized soft tissue edema along the dorsal aspect of the foot. There is a soft tissue ulcer along the plantar medial aspect of the knee left foot of the level of the first metatarsal head with enhancement. There is no associated fluid collection. The signal abnormality is isolated to the subcutaneous fat and overlying skin.  There is no other area of abnormal enhancement. The vasculature is slightly increased in signal consistent with the patient's history of diabetes.  IMPRESSION: 1. No evidence of osteomyelitis of the left foot. 2. Focal area of soft tissue thickening and enhancement along the plantar medial aspect of the left foot at the level of the first metatarsal head consistent with known nonhealing ulcer. No fluid collection or hematoma.   Electronically Signed   By: Kathreen Devoid   On: 07/27/2014 08:24   Medications: I have reviewed the patient's current medications. Scheduled Meds: . amLODipine  10 mg Oral Daily  . citalopram  20 mg Oral Daily  . collagenase   Topical Daily  . losartan  100 mg Oral Daily   And  . hydrochlorothiazide  25 mg Oral Daily  . insulin aspart  0-20 Units Subcutaneous TID WC  . insulin glargine  20 Units Subcutaneous QHS  . multivitamin with minerals  1 tablet Oral Daily  . nicotine  14 mg Transdermal Daily  . pantoprazole  40 mg Oral Daily  . predniSONE  15 mg Oral Q breakfast  . sodium chloride  3 mL Intravenous Q12H  . vancomycin  1,250 mg Intravenous Q12H  . warfarin  15 mg Oral ONCE-1800  . Warfarin - Pharmacist Dosing Inpatient   Does not apply q1800   Continuous Infusions:  PRN Meds:.acetaminophen, acetaminophen, HYDROcodone-acetaminophen, ondansetron (ZOFRAN)  IV, ondansetron, traMADol Assessment/Plan: 63 yr old woman with PMH of DM2, presenting with non-healing diabetic foot ulcer that failed outpatient management.  Nonhealing diabetic foot ulcer: Failed clindamycin for 8 days then developed reaction and stopped, cellulitis worsened after that. Her  MRI foot is negative for osteo.  Her blood cultures are NGTD.  - Continue vancomycin. (Zosyn discontinued yesterday)  - Norco PRN pain  - wound care consulted: santyl ointment to chemically debrinve nonviable tissue. Follow up at outpatient woudn care center after discharge.   DM - uncontrolled - hga1c 13.5%. On prednisone for temporal arteritis. CBG has been 100-200's.  - Continue SSI resistant.   - currently on lantus 10 here. At home takes 45 BID. Will adjust lantus dose as needed.   Temporal arteritis  - continue prednisone 15 mg (on long taper)   HTN  - cont amlodopine 10mg  + losartan 100mg  daily and HCTZ 25mg  daily.   Depression  - continue home citalopram   Recurrent PE On 2011 and 2013 - INR subtherapeutic.  -Coumadin per pharmacy.   Code: full  Diet: carb  PPX: coumadin, SCDs   Dispo: Disposition is deferred at this time, awaiting improvement of current medical problems. Anticipated discharge in approximately 1-2 day(s).   The patient does have a current PCP Jessee Avers, MD) and does need an [facility] hospital follow-up appointment after discharge.  The patient does not know have transportation limitations that hinder transportation to clinic appointments.  .Services Needed at time of discharge: Y = Yes, Blank = No PT:   OT:   RN:   Equipment:   Other:     LOS: 2 days   Blain Pais, MD 07/28/2014, 6:32 PM

## 2014-07-29 LAB — GLUCOSE, CAPILLARY
GLUCOSE-CAPILLARY: 137 mg/dL — AB (ref 70–99)
GLUCOSE-CAPILLARY: 232 mg/dL — AB (ref 70–99)
Glucose-Capillary: 191 mg/dL — ABNORMAL HIGH (ref 70–99)
Glucose-Capillary: 296 mg/dL — ABNORMAL HIGH (ref 70–99)

## 2014-07-29 LAB — PROTIME-INR
INR: 1.89 — AB (ref 0.00–1.49)
PROTHROMBIN TIME: 21.7 s — AB (ref 11.6–15.2)

## 2014-07-29 LAB — VANCOMYCIN, TROUGH: VANCOMYCIN TR: 27 ug/mL — AB (ref 10.0–20.0)

## 2014-07-29 MED ORDER — VANCOMYCIN HCL 10 G IV SOLR
1250.0000 mg | INTRAVENOUS | Status: DC
  Filled 2014-07-29: qty 1250

## 2014-07-29 MED ORDER — WARFARIN SODIUM 2.5 MG PO TABS
12.5000 mg | ORAL_TABLET | Freq: Once | ORAL | Status: AC
  Administered 2014-07-29: 12.5 mg via ORAL
  Filled 2014-07-29: qty 1

## 2014-07-29 NOTE — Progress Notes (Signed)
ANTICOAGULATION & ANTIBIOTIC CONSULT NOTE - Follow Up Consult  Pharmacy Consult for Coumadin; Vancomycin  Indication: Hx of recurrent PE; Non-healing diabetic foot ulcer   Allergies  Allergen Reactions  . Keflex [Cephalexin] Itching and Swelling    Lips and eyes swelled up  . Clindamycin/Lincomycin Itching  . Latex Itching  . Sulfa Antibiotics Rash  . Sulfur Rash    Patient Measurements: Height: 5\' 9"  (175.3 cm) Weight: 272 lb 3.2 oz (123.469 kg) IBW/kg (Calculated) : 66.2  Vital Signs: Temp: 98.4 F (36.9 C) (08/09 0951) Temp src: Oral (08/09 0951) BP: 143/85 mmHg (08/09 0951) Pulse Rate: 86 (08/09 0951)  Labs:  Recent Labs  07/26/14 1830 07/27/14 0557 07/27/14 1110 07/28/14 0415 07/29/14 0335  HGB 11.5* 10.9*  --   --   --   HCT 36.5 34.4*  --   --   --   PLT 249 247  --   --   --   LABPROT 15.9*  --  15.4* 17.0* 21.7*  INR 1.27  --  1.22 1.38 1.89*  CREATININE 1.19* 1.06  --  1.28*  --     Estimated Creatinine Clearance: 62.5 ml/min (by C-G formula based on Cr of 1.28).  Assessment: AC: 27 YOF on chronic coumadin for recurrent PE, INR remains subtherapeutic but trended up to 1.89. No bleeding noted per chart.  PTA dose: 12.5 mg on Monday and Thursdays, 10 mg on all other days, but INR 1.27 on admission.   ID: Pt is currently on Vancomycin for non-healing diabetic foot ulcer. MRI negative for osteo. BCx NGTD. Vanc trough this AM was supra-therapeutic at 27 on Vancomycin 1250 mg IV Q 12 hours.   Goal of Therapy:  INR 2-3 Monitor platelets by anticoagulation protocol: Yes   Plan:  - Give Coumadin 12.5 mg po x 1 tonight - f/u daily PT/INR - Decrease Vancomycin to 1250 mg IV Q 24 hours; given allergy to Keflex, unable to narrow down to cephalosporin  - F/u Bmet and cultures   Albertina Parr, PharmD.  Clinical Pharmacist Pager [phone]

## 2014-07-29 NOTE — Progress Notes (Signed)
Subjective: She still has tenderness of her left toe with ambulation. Denies fever/chills, N/V, or diarrhea.   Objective: Vital signs in last 24 hours: Filed Vitals:   07/28/14 0948 07/28/14 1801 07/28/14 2134 07/29/14 0424  BP: 142/88 151/87 167/97 174/78  Pulse: 88 81 85 98  Temp: 98.6 F (37 C) 98.6 F (37 C) 98.6 F (37 C) 98.4 F (36.9 C)  TempSrc: Oral Oral Oral Oral  Resp: 16 16 17 18   Height:      Weight:   272 lb 3.2 oz (123.469 kg)   SpO2: 92% 91% 94% 96%   Weight change: -3 lb 4.8 oz (-1.497 kg)  Intake/Output Summary (Last 24 hours) at 07/29/14 0924 Last data filed at 07/29/14 0053  Gross per 24 hour  Intake    720 ml  Output      0 ml  Net    720 ml   Vitals reviewed.  General: Sitting up in chair, inNAD  HEENT: no scleral icterus  Cardiac: RRR, no rubs, murmurs or gallops  Pulm: clear to auscultation bilaterally, no wheezes, rales, or rhonchi  Abd: soft, nontender, nondistended, BS present  Ext: has trace edema bilaterally up to her knees L>R. Left foot has erythema, swelling but improved from yesterday. No warmth felt. Plantar surface of left great toe has an opening without any discharge. Wound is ~2cmx 2cm in diameter with surrounding pink granulation tissue and center with 80% dry yellow crust with no discharge.  Neuro: alert and oriented X3, moves all extremities voluntairly  Lab Results: Basic Metabolic Panel:  Recent Labs Lab 07/27/14 0557 07/28/14 0415  NA 143 141  K 4.5 4.2  CL 106 102  CO2 26 27  GLUCOSE 251* 257*  BUN 17 16  CREATININE 1.06 1.28*  CALCIUM 9.0 8.8   Liver Function Tests:  Recent Labs Lab 07/26/14 1830  AST 14  ALT 42*  ALKPHOS 154*  BILITOT 0.2*  PROT 6.3  ALBUMIN 2.3*   CBC:  Recent Labs Lab 07/26/14 1830 07/27/14 0557  WBC 12.4* 12.6*  NEUTROABS 10.3*  --   HGB 11.5* 10.9*  HCT 36.5 34.4*  MCV 89.0 89.4  PLT 249 247   CBG:  Recent Labs Lab 07/27/14 2123 07/28/14 0735 07/28/14 1122  07/28/14 1622 07/28/14 2129 07/29/14 0736  GLUCAP 311* 210* 167* 264* 358* 137*   Hemoglobin A1C:  Recent Labs Lab 07/26/14 1629  HGBA1C 13.4     Recent Labs Lab 07/26/14 1830 07/27/14 1110 07/28/14 0415 07/29/14 0335  LABPROT 15.9* 15.4* 17.0* 21.7*  INR 1.27 1.22 1.38 1.89*   Urine Drug Screen: Drugs of Abuse     Component Value Date/Time   LABOPIA POSITIVE* 10/12/2012 0803   COCAINSCRNUR NONE DETECTED 10/12/2012 0803   LABBENZ NONE DETECTED 10/12/2012 0803   AMPHETMU NONE DETECTED 10/12/2012 0803   THCU NONE DETECTED 10/12/2012 0803   LABBARB NONE DETECTED 10/12/2012 0803    Micro Results: Recent Results (from the past 240 hour(s))  CULTURE, BLOOD (ROUTINE X 2)     Status: None   Collection Time    07/26/14  6:30 PM      Result Value Ref Range Status   Specimen Description BLOOD RIGHT ARM   Final   Special Requests BOTTLES DRAWN AEROBIC AND ANAEROBIC 5CC   Final   Culture  Setup Time     Final   Value: 07/26/2014 21:38     Performed at [facility]  Final   Value:        BLOOD CULTURE RECEIVED NO GROWTH TO DATE CULTURE WILL BE HELD FOR 5 DAYS BEFORE ISSUING A FINAL NEGATIVE REPORT     Performed at [facility]   Report Status PENDING   Incomplete  CULTURE, BLOOD (ROUTINE X 2)     Status: None   Collection Time    07/26/14  6:40 PM      Result Value Ref Range Status   Specimen Description BLOOD RIGHT HAND   Final   Special Requests BOTTLES DRAWN AEROBIC AND ANAEROBIC 5CC   Final   Culture  Setup Time     Final   Value: 07/26/2014 21:38     Performed at [facility]   Culture     Final   Value:        BLOOD CULTURE RECEIVED NO GROWTH TO DATE CULTURE WILL BE HELD FOR 5 DAYS BEFORE ISSUING A FINAL NEGATIVE REPORT     Performed at [facility]   Report Status PENDING   Incomplete   Medications: I have reviewed the patient's current medications. Scheduled Meds: . amLODipine  10 mg Oral Daily  . citalopram  20  mg Oral Daily  . collagenase   Topical Daily  . losartan  100 mg Oral Daily   And  . hydrochlorothiazide  25 mg Oral Daily  . insulin aspart  0-20 Units Subcutaneous TID WC  . insulin glargine  20 Units Subcutaneous QHS  . multivitamin with minerals  1 tablet Oral Daily  . nicotine  14 mg Transdermal Daily  . pantoprazole  40 mg Oral Daily  . predniSONE  15 mg Oral Q breakfast  . sodium chloride  3 mL Intravenous Q12H  . vancomycin  1,250 mg Intravenous Q12H  . Warfarin - Pharmacist Dosing Inpatient   Does not apply q1800   Continuous Infusions:  PRN Meds:.acetaminophen, acetaminophen, HYDROcodone-acetaminophen, ondansetron (ZOFRAN) IV, ondansetron, traMADol Assessment/Plan: 63 yr old woman with PMH of DM2, presenting with non-healing diabetic foot ulcer that failed outpatient management.   Nonhealing diabetic foot ulcer: Failed clindamycin for 8 days then developed reaction and stopped, cellulitis worsened after that. Her MRI foot is negative for osteo. Her blood cultures are NGTD.  - Continue vancomycin. (Zosyn discontinued 8/7)  - Norco PRN pain  - wound care consulted: santyl ointment to chemically debrinve nonviable tissue. Follow up at outpatient woudn care center after discharge.   DM - uncontrolled - hga1c 13.5%. On prednisone for temporal arteritis. CBG has been 100-200's.  - Continue SSI resistant.  - currently on lantus 10 here. At home takes 45 BID. Will adjust lantus dose as needed.   Temporal arteritis  - continue prednisone 15 mg (on long taper)   HTN  - cont amlodopine 10mg  + losartan 100mg  daily and HCTZ 25mg  daily.   Depression  - continue home citalopram   Recurrent PE On 2011 and 2013 - INR subtherapeutic. INR check tomorrow.  -Coumadin per pharmacy.   Code: full   Diet: carb   PPX: coumadin, SCDs   Dispo: Disposition is deferred at this time, awaiting improvement of current medical problems. Anticipated discharge in approximately 1-2 day(s).  The  patient does have a current PCP Jessee Avers, MD) and does need an [facility] hospital follow-up appointment after discharge.  The patient does not know have transportation limitations that hinder transportation to clinic appointments.    .Services Needed at time of discharge: Y = Yes, Blank =  No PT:   OT:   RN:   Equipment:   Other:     LOS: 3 days   Blain Pais, MD 07/29/2014, 9:24 AM

## 2014-07-30 ENCOUNTER — Ambulatory Visit: Payer: Medicare Other

## 2014-07-30 DIAGNOSIS — M7989 Other specified soft tissue disorders: Secondary | ICD-10-CM

## 2014-07-30 DIAGNOSIS — M79609 Pain in unspecified limb: Secondary | ICD-10-CM

## 2014-07-30 DIAGNOSIS — R269 Unspecified abnormalities of gait and mobility: Secondary | ICD-10-CM

## 2014-07-30 LAB — CBC WITH DIFFERENTIAL/PLATELET
Basophils Absolute: 0.1 10*3/uL (ref 0.0–0.1)
Basophils Relative: 1 % (ref 0–1)
Eosinophils Absolute: 0.2 10*3/uL (ref 0.0–0.7)
Eosinophils Relative: 2 % (ref 0–5)
HCT: 36.2 % (ref 36.0–46.0)
HEMOGLOBIN: 11.6 g/dL — AB (ref 12.0–15.0)
Lymphocytes Relative: 35 % (ref 12–46)
Lymphs Abs: 3.3 10*3/uL (ref 0.7–4.0)
MCH: 28.3 pg (ref 26.0–34.0)
MCHC: 32 g/dL (ref 30.0–36.0)
MCV: 88.3 fL (ref 78.0–100.0)
MONO ABS: 0.8 10*3/uL (ref 0.1–1.0)
MONOS PCT: 8 % (ref 3–12)
NEUTROS ABS: 5.1 10*3/uL (ref 1.7–7.7)
Neutrophils Relative %: 54 % (ref 43–77)
Platelets: 312 10*3/uL (ref 150–400)
RBC: 4.1 MIL/uL (ref 3.87–5.11)
RDW: 17.5 % — ABNORMAL HIGH (ref 11.5–15.5)
WBC: 9.3 10*3/uL (ref 4.0–10.5)

## 2014-07-30 LAB — BASIC METABOLIC PANEL
ANION GAP: 13 (ref 5–15)
BUN: 22 mg/dL (ref 6–23)
CHLORIDE: 100 meq/L (ref 96–112)
CO2: 27 mEq/L (ref 19–32)
CREATININE: 1.24 mg/dL — AB (ref 0.50–1.10)
Calcium: 8.7 mg/dL (ref 8.4–10.5)
GFR calc Af Amer: 52 mL/min — ABNORMAL LOW (ref >90)
GFR calc non Af Amer: 45 mL/min — ABNORMAL LOW (ref >90)
GLUCOSE: 224 mg/dL — AB (ref 70–99)
Potassium: 4.3 mEq/L (ref 3.7–5.3)
Sodium: 140 mEq/L (ref 137–147)

## 2014-07-30 LAB — PROTIME-INR
INR: 2.69 — ABNORMAL HIGH (ref 0.00–1.49)
Prothrombin Time: 28.6 seconds — ABNORMAL HIGH (ref 11.6–15.2)

## 2014-07-30 LAB — GLUCOSE, CAPILLARY
Glucose-Capillary: 210 mg/dL — ABNORMAL HIGH (ref 70–99)
Glucose-Capillary: 233 mg/dL — ABNORMAL HIGH (ref 70–99)
Glucose-Capillary: 351 mg/dL — ABNORMAL HIGH (ref 70–99)

## 2014-07-30 MED ORDER — FUROSEMIDE 40 MG PO TABS
40.0000 mg | ORAL_TABLET | Freq: Once | ORAL | Status: AC
  Administered 2014-07-30: 40 mg via ORAL
  Filled 2014-07-30 (×2): qty 1

## 2014-07-30 MED ORDER — SENNA 8.6 MG PO TABS
1.0000 | ORAL_TABLET | Freq: Every day | ORAL | Status: DC | PRN
  Administered 2014-07-30: 8.6 mg via ORAL
  Filled 2014-07-30: qty 1

## 2014-07-30 MED ORDER — COLLAGENASE 250 UNIT/GM EX OINT: TOPICAL_OINTMENT | Freq: Every day | CUTANEOUS | Status: DC

## 2014-07-30 MED ORDER — NICOTINE 14 MG/24HR TD PT24: 14.0000 mg | MEDICATED_PATCH | Freq: Every day | TRANSDERMAL | Status: DC

## 2014-07-30 NOTE — Progress Notes (Signed)
Medicare Important Message given?  YES (If response is "NO", the following Medicare IM given date fields will be blank) Date Medicare IM given:  07/30/2014 Medicare IM given by:  Batya Citron 

## 2014-07-30 NOTE — Progress Notes (Signed)
Subjective:  Feels week on left leg, states that she almost fell yesterday but nurse caught her. Has pain on both legs, L>R and has swelling on both sides. Also has her chronic back pain. Feels constipated, last BM was 4 days ago, is passing flatus. She is really concerned about her gait instability.   Denies sob/cp/n/v/fever/chills.  Objective: Vital signs in last 24 hours: Filed Vitals:   07/29/14 0951 07/29/14 1659 07/29/14 2155 07/30/14 0439  BP: 143/85 127/78 125/66 144/83  Pulse: 86 83 78 83  Temp: 98.4 F (36.9 C) 98.5 F (36.9 C) 97.5 F (36.4 C) 98 F (36.7 C)  TempSrc: Oral Oral Oral Oral  Resp: 18 18 17 16   Height:      Weight:   121.655 kg (268 lb 3.2 oz)   SpO2: 94% 92% 96% 94%   Weight change: -1.814 kg (-4 lb)  Intake/Output Summary (Last 24 hours) at 07/30/14 3976 Last data filed at 07/29/14 2156  Gross per 24 hour  Intake   1404 ml  Output      1 ml  Net   1403 ml   Physical exam Vitals reviewed. General: resting, NAD HEENT: PERRL, EOMI, no scleral icterus Cardiac: RRR, no rubs, murmurs or gallops Pulm: clear to auscultation bilaterally, no wheezes, rales, or rhonchi Abd: soft, nontender, nondistended, BS present Ext: has 2+ edema upto her knees L>R. Left foot has some swelling, no discharge. Feels midlly warm to touch.. Plantar surface of left great toe has an opening without any discharge. Healing well. Neuro: alert and oriented X3, cranial nerves II-XII grossly intact, strength and sensation to light touch equal in bilateral upper and lower extremities  Lab Results: Basic Metabolic Panel:  Recent Labs Lab 07/28/14 0415 07/30/14 0445  NA 141 140  K 4.2 4.3  CL 102 100  CO2 27 27  GLUCOSE 257* 224*  BUN 16 22  CREATININE 1.28* 1.24*  CALCIUM 8.8 8.7   Liver Function Tests:  Recent Labs Lab 07/26/14 1830  AST 14  ALT 42*  ALKPHOS 154*  BILITOT 0.2*  PROT 6.3  ALBUMIN 2.3*  CBC:  Recent Labs Lab 07/26/14 1830 07/27/14 0557  07/30/14 0445  WBC 12.4* 12.6* 9.3  NEUTROABS 10.3*  --  5.1  HGB 11.5* 10.9* 11.6*  HCT 36.5 34.4* 36.2  MCV 89.0 89.4 88.3  PLT 249 247 312  CBG:  Recent Labs Lab 07/28/14 1622 07/28/14 2129 07/29/14 0736 07/29/14 1136 07/29/14 1658 07/29/14 2143  GLUCAP 264* 358* 137* 191* 296* 232*   Hemoglobin A1C:  Recent Labs Lab 07/26/14 1629  HGBA1C 13.4   Coagulation:  Recent Labs Lab 07/27/14 1110 07/28/14 0415 07/29/14 0335 07/30/14 0445  LABPROT 15.4* 17.0* 21.7* 28.6*  INR 1.22 1.38 1.89* 2.69*  Urine Drug Screen: Drugs of Abuse     Component Value Date/Time   LABOPIA POSITIVE* 10/12/2012 0803   COCAINSCRNUR NONE DETECTED 10/12/2012 0803   LABBENZ NONE DETECTED 10/12/2012 0803   AMPHETMU NONE DETECTED 10/12/2012 0803   THCU NONE DETECTED 10/12/2012 0803   LABBARB NONE DETECTED 10/12/2012 0803    Micro Results: Recent Results (from the past 240 hour(s))  CULTURE, BLOOD (ROUTINE X 2)     Status: None   Collection Time    07/26/14  6:30 PM      Result Value Ref Range Status   Specimen Description BLOOD RIGHT ARM   Final   Special Requests BOTTLES DRAWN AEROBIC AND ANAEROBIC 5CC   Final   Culture  Setup Time     Final   Value: 07/26/2014 21:38     Performed at [facility]   Culture     Final   Value:        BLOOD CULTURE RECEIVED NO GROWTH TO DATE CULTURE WILL BE HELD FOR 5 DAYS BEFORE ISSUING A FINAL NEGATIVE REPORT     Performed at [facility]   Report Status PENDING   Incomplete  CULTURE, BLOOD (ROUTINE X 2)     Status: None   Collection Time    07/26/14  6:40 PM      Result Value Ref Range Status   Specimen Description BLOOD RIGHT HAND   Final   Special Requests BOTTLES DRAWN AEROBIC AND ANAEROBIC 5CC   Final   Culture  Setup Time     Final   Value: 07/26/2014 21:38     Performed at [facility]   Culture     Final   Value:        BLOOD CULTURE RECEIVED NO GROWTH TO DATE CULTURE WILL BE HELD FOR 5 DAYS BEFORE ISSUING A  FINAL NEGATIVE REPORT     Performed at [facility]   Report Status PENDING   Incomplete   Studies/Results: No results found. Medications: I have reviewed the patient's current medications. Scheduled Meds: . amLODipine  10 mg Oral Daily  . citalopram  20 mg Oral Daily  . collagenase   Topical Daily  . losartan  100 mg Oral Daily   And  . hydrochlorothiazide  25 mg Oral Daily  . insulin aspart  0-20 Units Subcutaneous TID WC  . insulin glargine  20 Units Subcutaneous QHS  . multivitamin with minerals  1 tablet Oral Daily  . nicotine  14 mg Transdermal Daily  . pantoprazole  40 mg Oral Daily  . predniSONE  15 mg Oral Q breakfast  . sodium chloride  3 mL Intravenous Q12H  . Warfarin - Pharmacist Dosing Inpatient   Does not apply q1800   Continuous Infusions:  PRN Meds:.acetaminophen, acetaminophen, HYDROcodone-acetaminophen, ondansetron (ZOFRAN) IV, ondansetron, senna, traMADol Assessment/Plan: Principal Problem:   Diabetic foot ulcer Active Problems:   HTN (hypertension)   DM (diabetes mellitus), type 2   Long term (current) use of anticoagulants   Temporal arteritis  Nonhealing diabetic foot ulcer - healing well now  - took clinda for 8 days then developed rxn and stopped, cellulitis worsened after that. - MRI negative for osteo. - Mono. - was on vanc+zosyn for 2 days, then vanc for 4 days. D/ced vanc today because trough was 27 and ulcer is healing well. Will switch to doxy today and have her take doxy for total 10 more days. - Norco PRN pain - wound care consulted: santyl ointment to chemically debrinve nonviable tissue. Follow up at outpatient wound care center after discharge.  - will need outpatient BMP in few days to make sure she doesn't have nephrotoxicity from Vancomycin.  - will try to get soft shoes.  Gait instability on left leg - could be due to the pain from the ulcer or 2/2 to steroid induced myopathy.  - Has chronic back pain but she now has  bilateral leg pain with swelling but neurlogical exam is normal. - PT ordered. - should improve with taper of steroid, improvement of ulcer, and improvement of pain.  DM - uncontrolled - hga1c 13.5 - on prednisone. CBG has been running high 200's.  - switched SSI from mod --> resistant  -  currently on lantus 20 here. At home takes 45 BID. Will adjust lantus dose as needed.  Temporal arteritis  - continue prednisone 15 mg (is on long taper)  HTN - mostly controlled - cont amlodopine 10mg  + losartan 100mg  daily and HCTZ 25mg  daily.  Depression  - continue home citalopram  Recurrent PE  On 2011 and 2013 - INR has been subtherapeutic. But today it's in the therapeutic range. Coumadin per pharmacy.  Code: full Diet: carb PPX: coumadin.   Dispo: Disposition is deferred at this time, awaiting improvement of current medical problems.  Anticipated discharge in approximately 2-3 day(s).   The patient does have a current PCP Jessee Avers, MD) and does need an [facility] hospital follow-up appointment after discharge.  The patient does not know have transportation limitations that hinder transportation to clinic appointments.  .Services Needed at time of discharge: Y = Yes, Blank = No PT:   OT:   RN:   Equipment:   Other:     LOS: 4 days   Dellia Nims, MD 07/30/2014, 8:22 AM

## 2014-07-30 NOTE — Progress Notes (Signed)
Pt discharge instructions given, pt verbalized understanding. VSS.  Pt voucher called house coverage for transportation home.

## 2014-07-30 NOTE — Discharge Summary (Signed)
Name: Joy Butler MRN: [id number] DOB: 11/11/1951 63 y.o. PCP: Jessee Avers, MD  Date of Admission: 07/26/2014  5:31 PM Date of Discharge: 07/30/2014 Attending Physician: Carlyle Basques, MD  Discharge Diagnosis: Principal Problem:   Diabetic foot ulcer Active Problems:   HTN (hypertension)   DM (diabetes mellitus), type 2   Long term (current) use of anticoagulants   Temporal arteritis  Discharge Medications:   Medication List    ASK your doctor about these medications       amitriptyline 25 MG tablet  Commonly known as:  ELAVIL  Take 50 mg by mouth at bedtime.     amLODipine 10 MG tablet  Commonly known as:  NORVASC  Take 1 tablet (10 mg total) by mouth daily.     citalopram 20 MG tablet  Commonly known as:  CELEXA  Take 20 mg by mouth daily.     gabapentin 300 MG capsule  Commonly known as:  NEURONTIN  Take 300 mg by mouth 3 (three) times daily.     insulin NPH-regular Human (70-30) 100 UNIT/ML injection  Commonly known as:  NOVOLIN 70/30  Inject 45 Units into the skin 2 (two) times daily with a meal.     losartan-hydrochlorothiazide 100-25 MG per tablet  Commonly known as:  HYZAAR  Take 1 tablet by mouth daily.     metFORMIN 500 MG tablet  Commonly known as:  GLUCOPHAGE  Take 1 tablet (500 mg total) by mouth 2 (two) times daily with a meal.     multivitamin with minerals Tabs tablet  Take 1 tablet by mouth daily.     omeprazole 20 MG capsule  Commonly known as:  PRILOSEC  Take 1 capsule (20 mg total) by mouth daily.     predniSONE 10 MG tablet  Commonly known as:  DELTASONE  Take 1-20 mg by mouth daily with breakfast. Tapered course: take 2 tablets (20 mg) daily until 07/25/14, take 1 1/2 tablets (15 mg) daily 07/26/14 thru 08/09/14, take 1 tablet (10 mg) daily 08/10/14 thru 02/17/15, then decrease by 1 mg every month until stopped     traMADol 50 MG tablet  Commonly known as:  ULTRAM  Take 50 mg by mouth every 8 (eight) hours as needed (pain).       warfarin 5 MG tablet  Commonly known as:  COUMADIN  Take 10-12.5 mg by mouth every evening. Take 2 1/2 tablets (12.5 mg) on Monday and Thursday, take 2 tablets (10 mg) on all other days of the week        Disposition and follow-up:   Joy Butler was discharged from [facility] in Stable condition.  At the hospital follow up visit please address:  1.  Monitor improvement of foot ulcer. Assess for insulin adjustment.   2.  Labs / imaging needed at time of follow-up: obtain a BMP in 2-3 (has appt of 08/02/14) days to make sure she is not having nephrotoxicity from vanc.   3.  Pending labs/ test needing follow-up:   Follow-up Appointments: Follow-up Information   Follow up with [place]              Today. (appointment for Friday, August 10, 2014 at 10 am.)    Contact information:   [address] [place] [phone]      Follow up with Jessee Avers, MD. (appointment for August 13th, at 2:45 PM at the internal medicine  clinic.)    Specialty:  Internal Medicine   Contact information:   [place]  [phone]      PCP appointment Wound care outpatient appointment on August 21st, 2015 - setup by case manager.  Discharge Instructions:   Consultations:    PT Wound care  Procedures Performed:  Dg Hip Complete Left  07/17/2014   CLINICAL DATA:  Status post fall.  Left hip pain.  EXAM: LEFT HIP - COMPLETE 2+ VIEW  COMPARISON:  None.  FINDINGS: There is no acute bony or joint abnormality. Mild to moderate degenerative change is present about the hips. No evidence of avascular necrosis is seen. Atherosclerosis is noted.  IMPRESSION: No acute finding.   Electronically Signed   By: Inge Rise M.D.   On: 07/17/2014 10:59   Mr Foot Left W Wo Contrast  07/27/2014   CLINICAL DATA:  Diabetic foot ulcer of the left foot.  EXAM: MRI OF THE LEFT FOREFOOT WITHOUT AND  WITH CONTRAST  TECHNIQUE: Multiplanar, multisequence MR imaging was performed both before and after administration of intravenous contrast.  CONTRAST:  72mL MULTIHANCE GADOBENATE DIMEGLUMINE 529 MG/ML IV SOLN  COMPARISON:  None.  FINDINGS: No focal marrow edema. No fracture or dislocation. No T1 marrow signal abnormality. No bone destruction or periostitis.  The visualized flexor, extensor and peroneal tendons are intact.  There is generalized soft tissue edema along the dorsal aspect of the foot. There is a soft tissue ulcer along the plantar medial aspect of the knee left foot of the level of the first metatarsal head with enhancement. There is no associated fluid collection. The signal abnormality is isolated to the subcutaneous fat and overlying skin.  There is no other area of abnormal enhancement. The vasculature is slightly increased in signal consistent with the patient's history of diabetes.  IMPRESSION: 1. No evidence of osteomyelitis of the left foot. 2. Focal area of soft tissue thickening and enhancement along the plantar medial aspect of the left foot at the level of the first metatarsal head consistent with known nonhealing ulcer. No fluid collection or hematoma.   Electronically Signed   By: Kathreen Devoid   On: 07/27/2014 08:24   Dg Knee Complete 4 Views Left  07/17/2014   CLINICAL DATA:  Status post fall.  Left knee pain.  EXAM: LEFT KNEE - COMPLETE 4+ VIEW  COMPARISON:  None.  FINDINGS: There is no acute bony or joint abnormality. No joint effusion is seen. 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 No joint effusion is identified. Atherosclerosis is noted.  IMPRESSION: No acute finding.  Tricompartmental degenerative disease.   Electronically Signed   By: Inge Rise M.D.   On: 07/17/2014 10:58   Admission HPI:   Joy Butler is a 63 year old woman with history of uncontrolled DM, HTN, recurrent PE on warfarin, LE neuropathy, probable temporal arteritis on  prednisone presenting with a nonhealing foot ulcer. She noticed some left lower extremity swelling and pain for the last two weeks. She noticed a blister on the plantar aspect of the base of left toe 11 days ago. It has been weeping clear fluid. Denies trauma to her foot.  She was seen in the ED 7/28 for the swelling and blister. XR left hip and knee were negative for acute process. Bilateral lower extremity venous duplex with no evidence of DVT, superficial thrombosis or Bakers cyst. A line was drawn to demarcate her erythema. She was discharged with clindamycin 450mg  QID x 10  days. She was seen in clinic 7/30 and was noted that the blister had no surrounding cellulitis. On 8/4 her home health nurse called to report she was having a reaction to the clindamycin - itchy rash on arms. She stopped the medication and took benadryl with improvement in the rash.  The erythema has worsened and spread beyond the original demarcation. She denies any new drainage. Endorses chills. She also reports polyuria and polydipsia x 1 week. Denies fevers, chest pain, shortness of breath, weakness, paresthesias.  Hospital Course by problem list:   Nonhealing diabetic foot ulcer - healing well now  - took clinda for 8 days then developed rxn and stopped, cellulitis worsened after that.  - MRI negative for osteo. - [place].  - was on vanc+zosyn for 2 days, then vanc for 4 days. D/ced vanc today because trough was 27 and ulcer is healing well. Will switch to doxy. No abx for today since her vanc trough is high. Will have doxy starting tomorrow for 5 more days. Total abx coverage 10 days.  - Norco PRN pain  - wound care consulted: santyl ointment to chemically debrinve nonviable tissue. Follow up at outpatient wound care center after discharge.  - will need outpatient BMP in few days to make sure she doesn't have nephrotoxicity from Vancomycin.  - will try to get soft shoes before D/c also rolling walker.   Gait instability  on left leg  - could be due to the pain from the ulcer or 2/2 to steroid induced myopathy.  - Has chronic back pain but she now has bilateral leg pain with swelling but neurlogical exam is normal.  - PT ordered.  - should improve with taper of steroid, improvement of ulcer, and improvement of pain.   DM - uncontrolled - hga1c 13.5  - on prednisone. CBG has been running high 200's.  - switched SSI from mod --> resistant  - currently on lantus 20 here. At home takes 45 BID.   Temporal arteritis  - continue prednisone 15 mg (is on long taper)   HTN - mostly controlled  - cont amlodopine 10mg  + losartan 100mg  daily and HCTZ 25mg  daily.   Depression  - continue home citalopram   Recurrent PE On 2011 and 2013  - INR has been subtherapeutic. But today it's in the therapeutic range. Coumadin per pharmacy.   Discharge Vitals:   BP 116/79  Pulse 85  Temp(Src) 98 F (36.7 C) (Oral)  Resp 17  Ht 5\' 9"  (1.753 m)  Wt 121.655 kg (268 lb 3.2 oz)  BMI 39.59 kg/m2  SpO2 95%  Discharge Labs:  Results for orders placed during the hospital encounter of 07/26/14 (from the past 24 hour(s))  GLUCOSE, CAPILLARY     Status: Abnormal   Collection Time    07/29/14  4:58 PM      Result Value Ref Range   Glucose-Capillary 296 (*) 70 - 99 mg/dL  GLUCOSE, CAPILLARY     Status: Abnormal   Collection Time    07/29/14  9:43 PM      Result Value Ref Range   Glucose-Capillary 232 (*) 70 - 99 mg/dL  PROTIME-INR     Status: Abnormal   Collection Time    07/30/14  4:45 AM      Result Value Ref Range   Prothrombin Time 28.6 (*) 11.6 - 15.2 seconds   INR 2.69 (*) 0.00 - 5.17  BASIC METABOLIC PANEL     Status: Abnormal  Collection Time    07/30/14  4:45 AM      Result Value Ref Range   Sodium 140  137 - 147 mEq/L   Potassium 4.3  3.7 - 5.3 mEq/L   Chloride 100  96 - 112 mEq/L   CO2 27  19 - 32 mEq/L   Glucose, Bld 224 (*) 70 - 99 mg/dL   BUN 22  6 - 23 mg/dL   Creatinine, Ser 1.24 (*) 0.50 -  1.10 mg/dL   Calcium 8.7  8.4 - 10.5 mg/dL   GFR calc non Af Amer 45 (*) >90 mL/min   GFR calc Af Amer 52 (*) >90 mL/min   Anion gap 13  5 - 15  CBC WITH DIFFERENTIAL     Status: Abnormal   Collection Time    07/30/14  4:45 AM      Result Value Ref Range   WBC 9.3  4.0 - 10.5 K/uL   RBC 4.10  3.87 - 5.11 MIL/uL   Hemoglobin 11.6 (*) 12.0 - 15.0 g/dL   HCT 36.2  36.0 - 46.0 %   MCV 88.3  78.0 - 100.0 fL   MCH 28.3  26.0 - 34.0 pg   MCHC 32.0  30.0 - 36.0 g/dL   RDW 17.5 (*) 11.5 - 15.5 %   Platelets 312  150 - 400 K/uL   Neutrophils Relative % 54  43 - 77 %   Neutro Abs 5.1  1.7 - 7.7 K/uL   Lymphocytes Relative 35  12 - 46 %   Lymphs Abs 3.3  0.7 - 4.0 K/uL   Monocytes Relative 8  3 - 12 %   Monocytes Absolute 0.8  0.1 - 1.0 K/uL   Eosinophils Relative 2  0 - 5 %   Eosinophils Absolute 0.2  0.0 - 0.7 K/uL   Basophils Relative 1  0 - 1 %   Basophils Absolute 0.1  0.0 - 0.1 K/uL  GLUCOSE, CAPILLARY     Status: Abnormal   Collection Time    07/30/14  8:04 AM      Result Value Ref Range   Glucose-Capillary 210 (*) 70 - 99 mg/dL    Signed: Dellia Nims, MD 07/30/2014, 11:43 AM   Services Ordered on Discharge: wound care outpatient on August 21.  Equipment Ordered on Discharge:  Rolling walker and soft shoe ordered by case manager

## 2014-07-30 NOTE — Progress Notes (Signed)
VASCULAR LAB PRELIMINARY  PRELIMINARY  PRELIMINARY  PRELIMINARY  Left lower extremity venous duplex completed.    Preliminary report:  Left:  No evidence of DVT, superficial thrombosis, or Baker's cyst.  Correne Lalani, RVT 07/30/2014, 4:35 PM

## 2014-07-30 NOTE — Progress Notes (Signed)
Pt sitting up to chair.  VSS.  Denies pain. Up with assist. Unsteady.

## 2014-07-30 NOTE — Care Management Note (Signed)
CARE MANAGEMENT NOTE 07/30/2014  Patient:  Joy Butler, Joy Butler   Account Number:  [id number]  Date Initiated:  07/30/2014  Documentation initiated by:  Jasmine Pang  Subjective/Objective Assessment:   Referral for [facility] needs and followup appointment at the [place].     Action/Plan:   Rolling walker and soft shoe ordered, pt scheduled at the [facility] for August 21,2015 @ 10am this is first available appointment.   Anticipated DC Date:  07/30/2014   Anticipated DC Plan:  [place]  CM consult      Choice offered to / List presented to:          [facility] arranged  HH-1 RN  [place].   Status of service:  Completed, signed off Medicare Important Message given?  YES (If response is "NO", the following Medicare IM given date fields will be blank) Date Medicare IM given:  07/30/2014 Medicare IM given by:  [facility] Date Additional Medicare IM given:   Additional Medicare IM given by:    Discharge Disposition:  [place]  Per UR Regulation:    If discussed at Long Length of Stay Meetings, dates discussed:    Comments:  07/30/2014 appointment scheduled at the [place] located at [facility] , first available is for August 10, 2014 @ 10am. Pt informed and info placed in d/c information. Jasmine Pang RN MPH, case Freight forwarder. [phone]

## 2014-07-30 NOTE — Evaluation (Signed)
Physical Therapy Evaluation Patient Details Name: Joy Butler MRN: [id number] DOB: [birth year] Today's Date: 07/30/2014   History of Present Illness  Patient is a 62 yo female admitted 07/26/14 with open wound on Lt foot and gait instability.  MRI negative for osteomyelitis.  PMH:  DM, HTN, recurrent PE, LE neuropathy, chronic back pain.  Clinical Impression  Patient presents with problems listed below.  Will benefit from acute PT to maximize functional mobility prior to discharge home.    Follow Up Recommendations Home health PT;Supervision/Assistance - 24 hour    Equipment Recommendations  Rolling walker with 5" wheels (Patient requesting lift chair.)    Recommendations for Other Services       Precautions / Restrictions Precautions Precautions: Fall Required Braces or Orthoses: Other Brace/Splint Other Brace/Splint: Post-op shoe Restrictions Weight Bearing Restrictions: No      Mobility  Bed Mobility                  Transfers Overall transfer level: Needs assistance Equipment used: Rolling walker (2 wheeled) Transfers: Sit to/from Stand Sit to Stand: Min assist         General transfer comment: Verbal cues for hand placement.  Assist for safety/balance as patient moved to standing.  Practiced x2 due to decreased balance with transfer.  Instructed patient to stand for several seconds before beginning ambulation for safety.  Ambulation/Gait Ambulation/Gait assistance: Min guard Ambulation Distance (Feet): 62 Feet Assistive device: Rolling walker (2 wheeled) Gait Pattern/deviations: Step-through pattern;Decreased stride length;Decreased weight shift to left;Antalgic;Trunk flexed Gait velocity: Decreased Gait velocity interpretation: Below normal speed for age/gender General Gait Details: Verbal cues for safe use of RW.  Patient with flexed posture - cues to stand upright during gait.  Patient reports increasing pain in LE's - returned to chair.  Pain  limiting mobility.  Stairs            Wheelchair Mobility    Modified Rankin (Stroke Patients Only)       Balance Overall balance assessment: Needs assistance         Standing balance support: Single extremity supported Standing balance-Leahy Scale: Fair                               Pertinent Vitals/Pain Pain Assessment: 0-10 Pain Score: 9  Pain Location: BLE's, Lt>Rt following gait.  Decreased with LE elevation. Pain Descriptors / Indicators: Throbbing Pain Intervention(s): Limited activity within patient's tolerance;Repositioned;Other (comment) (Elevated LE's)    Home Living Family/patient expects to be discharged to:: Private residence Living Arrangements: Spouse/significant other;Children Available Help at Discharge: Family;Available 24 hours/day Type of Home: House Home Access: Stairs to enter Entrance Stairs-Rails: Psychiatric nurse of Steps: 3 Home Layout: One level Home Equipment: Cane - single point;Shower seat      Prior Function Level of Independence: Independent with assistive device(s);Needs assistance   Gait / Transfers Assistance Needed: Ambulated with cane.  Unable to stand for longer periods of time.  ADL's / Homemaking Assistance Needed: Assist for meal prep and housekeeping.        Hand Dominance        Extremity/Trunk Assessment   Upper Extremity Assessment: Overall WFL for tasks assessed           Lower Extremity Assessment: Generalized weakness;LLE deficits/detail   LLE Deficits / Details: Noted open wound on plantar surface below great toe.  Applied dressing to cover.  Noted edema in lower leg and  foot.  Cervical / Trunk Assessment: Normal  Communication   Communication: No difficulties  Cognition Arousal/Alertness: Awake/alert Behavior During Therapy: WFL for tasks assessed/performed Overall Cognitive Status: Within Functional Limits for tasks assessed                       General Comments      Exercises        Assessment/Plan    PT Assessment Patient needs continued PT services  PT Diagnosis Difficulty walking;Abnormality of gait;Acute pain   PT Problem List Decreased strength;Decreased activity tolerance;Decreased balance;Decreased mobility;Decreased knowledge of use of DME;Impaired sensation;Pain  PT Treatment Interventions DME instruction;Gait training;Functional mobility training;Therapeutic activities;Therapeutic exercise;Patient/family education   PT Goals (Current goals can be found in the Care Plan section) Acute Rehab PT Goals Patient Stated Goal: To decrease pain. PT Goal Formulation: With patient Time For Goal Achievement: 08/06/14 Potential to Achieve Goals: Good    Frequency Min 3X/week   Barriers to discharge        Co-evaluation               End of Session Equipment Utilized During Treatment: Gait belt Activity Tolerance: Patient limited by pain;Patient limited by fatigue Patient left: in chair;with call bell/phone within reach Nurse Communication: Mobility status         Time: 8403-7543 PT Time Calculation (min): 29 min   Charges:   PT Evaluation $Initial PT Evaluation Tier I: 1 Procedure PT Treatments $Gait Training: 8-22 mins $Therapeutic Activity: 8-22 mins   PT G Codes:          Despina Pole 07/30/2014, 1:04 PM Carita Pian. Sanjuana Kava, [place] Pager [phone]

## 2014-07-30 NOTE — Progress Notes (Signed)
ANTICOAGULATION CONSULT NOTE - Follow Up Consult  Pharmacy Consult for coumadin Indication: hx recurrent PE  Allergies  Allergen Reactions  . Keflex [Cephalexin] Itching and Swelling    Lips and eyes swelled up  . Clindamycin/Lincomycin Itching  . Latex Itching  . Sulfa Antibiotics Rash  . Sulfur Rash    Patient Measurements: Height: 5\' 9"  (175.3 cm) Weight: 268 lb 3.2 oz (121.655 kg) IBW/kg (Calculated) : 66.2   Vital Signs: Temp: 98 F (36.7 C) (08/10 1008) Temp src: Oral (08/10 1008) BP: 116/79 mmHg (08/10 1008) Pulse Rate: 85 (08/10 1008)  Labs:  Recent Labs  07/28/14 0415 07/29/14 0335 07/30/14 0445  HGB  --   --  11.6*  HCT  --   --  36.2  PLT  --   --  312  LABPROT 17.0* 21.7* 28.6*  INR 1.38 1.89* 2.69*  CREATININE 1.28*  --  1.24*    Estimated Creatinine Clearance: 64 ml/min (by C-G formula based on Cr of 1.24).  Assessment: Patient is a 63 y.o F on coumadin for hx recurrent PE.  INR increased sharply from 1.89 to 2.69 today.  No bleeding documented.   Goal of Therapy:  INR 2-3    Plan:  1) Will hold dose today 2) f/u with patient in AM if not discharged tonight      Teyanna Thielman P 07/30/2014,2:56 PM

## 2014-07-30 NOTE — Care Management Note (Addendum)
CARE MANAGEMENT NOTE 07/30/2014  Patient:  Joy Butler, Joy Butler   Account Number:  [id number]  Date Initiated:  07/30/2014  Documentation initiated by:  Jasmine Pang  Subjective/Objective Assessment:   Referral for [facility] needs and followup appointment at the [facility].     Action/Plan:   Rolling walker and soft shoe ordered, pt scheduled at the [facility] for August 21,2015 @ 10am this is first available appointment.   Anticipated DC Date:     Anticipated DC Plan:  [place]         Choice offered to / List presented to:          [facility] arranged  HH-1 RN  [place].   Status of service:  Completed, signed off Medicare Important Message given?  yes (If response is "NO", the following Medicare IM given date fields will be blank) Date Medicare IM given:  07/30/2014 Medicare IM given by:  Marcheta Grammes, A Date Additional Medicare IM given: Additional Medicare IM given by:    Discharge Disposition:    Per UR Regulation:    If discussed at Long Length of Stay Meetings, dates discussed:    Comments:

## 2014-07-30 NOTE — Progress Notes (Signed)
Orthopedic Tech Progress Note Patient Details:  Joy Butler [birth year] [id number]  Ortho Devices Type of Ortho Device: Postop shoe/boot Ortho Device/Splint Interventions: Application   [name] 07/30/2014, 1:36 PM

## 2014-07-30 NOTE — Discharge Instructions (Signed)
It was a pleasure taking care of you. You were admitted to the hospital with left foot cellulitis. We have treated your cellulitis and it is improving currently. You will need to take doxycycline antibiotic for 5 days when you go home.    Cellulitis Cellulitis is an infection of the skin and the tissue beneath it. The infected area is usually red and tender. Cellulitis occurs most often in the arms and lower legs.  CAUSES  Cellulitis is caused by bacteria that enter the skin through cracks or cuts in the skin. The most common types of bacteria that cause cellulitis are staphylococci and streptococci. SIGNS AND SYMPTOMS   Redness and warmth.  Swelling.  Tenderness or pain.  Fever. DIAGNOSIS  Your health care provider can usually determine what is wrong based on a physical exam. Blood tests may also be done. TREATMENT  Treatment usually involves taking an antibiotic medicine. HOME CARE INSTRUCTIONS   Take your antibiotic medicine as directed by your health care provider. Finish the antibiotic even if you start to feel better.  Keep the infected arm or leg elevated to reduce swelling.  Apply a warm cloth to the affected area up to 4 times per day to relieve pain.  Take medicines only as directed by your health care provider.  Keep all follow-up visits as directed by your health care provider. SEEK MEDICAL CARE IF:   You notice red streaks coming from the infected area.  Your red area gets larger or turns dark in color.  Your bone or joint underneath the infected area becomes painful after the skin has healed.  Your infection returns in the same area or another area.  You notice a swollen bump in the infected area.  You develop new symptoms.  You have a fever. SEEK IMMEDIATE MEDICAL CARE IF:   You feel very sleepy.  You develop vomiting or diarrhea.  You have a general ill feeling (malaise) with muscle aches and pains. MAKE SURE YOU:   Understand these  instructions.  Will watch your condition.  Will get help right away if you are not doing well or get worse. Document Released: 09/16/2005 Document Revised: 04/23/2014 Document Reviewed: 02/22/2012 [facility] Patient Information 2015 [place], [place]. This information is not intended to replace advice given to you by your health care provider. Make sure you discuss any questions you have with your health care provider.

## 2014-07-30 NOTE — Progress Notes (Addendum)
Inpatient Diabetes Program Recommendations  AACE/ADA: New Consensus Statement on Inpatient Glycemic Control (2013)  Target Ranges:  Prepandial:   less than 140 mg/dL      Peak postprandial:   less than 180 mg/dL (1-2 hours)      Critically ill patients:  140 - 180 mg/dL     Results for Joy Butler, Joy Butler (MRN [id number]) as of 07/30/2014 12:28  Ref. Range 07/29/2014 07:36 07/29/2014 11:36 07/29/2014 16:58 07/29/2014 21:43  Glucose-Capillary Latest Range: 70-99 mg/dL 137 (H) 191 (H) 296 (H) 232 (H)    Results for Joy Butler, Joy Butler (MRN [id number]) as of 07/30/2014 12:28  Ref. Range 07/30/2014 08:04 07/30/2014 11:45  Glucose-Capillary Latest Range: 70-99 mg/dL 210 (H) 233 (H)     Home DM Meds: 70/30 insulin- 45 units bid with meals + Metformin 500 mg bid    **Patient eating 100% of meals   MD- Please consider the following insulin adjustments if pt not discharged home today:  1. Increase Lantus to 30 units QHS  2. Add Novolog Meal Coverage- Novolog 4 units tid with meals    Will follow Wyn Quaker RN, MSN, CDE Diabetes Coordinator Inpatient Diabetes Program Team Pager: [phone] (8a-10p)

## 2014-07-30 NOTE — Progress Notes (Signed)
  Date: 07/30/2014  Patient name: Joy Butler  Medical record number: [id number]  Date of birth: [birth year]   This patient has been seen and the plan of care was discussed with the house staff. Please see their note for complete details. I concur with their findings and plan for cellulitis, lower extremity swelling to rule out dvt and management of diabetic ulcer  Carlyle Basques, MD 07/30/2014, 5:01 PM

## 2014-07-31 ENCOUNTER — Ambulatory Visit (INDEPENDENT_AMBULATORY_CARE_PROVIDER_SITE_OTHER): Payer: Medicare Other | Admitting: Internal Medicine

## 2014-07-31 ENCOUNTER — Telehealth: Payer: Self-pay | Admitting: *Deleted

## 2014-07-31 VITALS — BP 139/83 | HR 108 | Temp 98.1°F | Ht 69.0 in | Wt 279.7 lb

## 2014-07-31 DIAGNOSIS — M549 Dorsalgia, unspecified: Secondary | ICD-10-CM | POA: Diagnosis not present

## 2014-07-31 DIAGNOSIS — E08621 Diabetes mellitus due to underlying condition with foot ulcer: Secondary | ICD-10-CM

## 2014-07-31 DIAGNOSIS — E785 Hyperlipidemia, unspecified: Secondary | ICD-10-CM | POA: Diagnosis not present

## 2014-07-31 DIAGNOSIS — Z7901 Long term (current) use of anticoagulants: Secondary | ICD-10-CM | POA: Diagnosis not present

## 2014-07-31 DIAGNOSIS — F3289 Other specified depressive episodes: Secondary | ICD-10-CM | POA: Diagnosis not present

## 2014-07-31 DIAGNOSIS — E1165 Type 2 diabetes mellitus with hyperglycemia: Secondary | ICD-10-CM | POA: Diagnosis not present

## 2014-07-31 DIAGNOSIS — E119 Type 2 diabetes mellitus without complications: Secondary | ICD-10-CM | POA: Diagnosis not present

## 2014-07-31 DIAGNOSIS — L0390 Cellulitis, unspecified: Secondary | ICD-10-CM | POA: Diagnosis not present

## 2014-07-31 DIAGNOSIS — E1149 Type 2 diabetes mellitus with other diabetic neurological complication: Secondary | ICD-10-CM | POA: Diagnosis not present

## 2014-07-31 DIAGNOSIS — L03119 Cellulitis of unspecified part of limb: Secondary | ICD-10-CM | POA: Diagnosis not present

## 2014-07-31 DIAGNOSIS — E1369 Other specified diabetes mellitus with other specified complication: Secondary | ICD-10-CM | POA: Diagnosis not present

## 2014-07-31 DIAGNOSIS — Z794 Long term (current) use of insulin: Secondary | ICD-10-CM | POA: Diagnosis not present

## 2014-07-31 DIAGNOSIS — Z0000 Encounter for general adult medical examination without abnormal findings: Secondary | ICD-10-CM | POA: Diagnosis not present

## 2014-07-31 DIAGNOSIS — M7989 Other specified soft tissue disorders: Secondary | ICD-10-CM | POA: Diagnosis not present

## 2014-07-31 DIAGNOSIS — I10 Essential (primary) hypertension: Secondary | ICD-10-CM | POA: Diagnosis not present

## 2014-07-31 DIAGNOSIS — L97509 Non-pressure chronic ulcer of other part of unspecified foot with unspecified severity: Secondary | ICD-10-CM

## 2014-07-31 DIAGNOSIS — E1142 Type 2 diabetes mellitus with diabetic polyneuropathy: Secondary | ICD-10-CM | POA: Diagnosis not present

## 2014-07-31 DIAGNOSIS — L02619 Cutaneous abscess of unspecified foot: Secondary | ICD-10-CM | POA: Diagnosis not present

## 2014-07-31 DIAGNOSIS — Z86711 Personal history of pulmonary embolism: Secondary | ICD-10-CM | POA: Diagnosis not present

## 2014-07-31 DIAGNOSIS — IMO0001 Reserved for inherently not codable concepts without codable children: Secondary | ICD-10-CM | POA: Diagnosis not present

## 2014-07-31 DIAGNOSIS — R269 Unspecified abnormalities of gait and mobility: Secondary | ICD-10-CM | POA: Diagnosis not present

## 2014-07-31 DIAGNOSIS — E7800 Pure hypercholesterolemia, unspecified: Secondary | ICD-10-CM | POA: Diagnosis not present

## 2014-07-31 DIAGNOSIS — Z23 Encounter for immunization: Secondary | ICD-10-CM

## 2014-07-31 DIAGNOSIS — D892 Hypergammaglobulinemia, unspecified: Secondary | ICD-10-CM | POA: Diagnosis not present

## 2014-07-31 DIAGNOSIS — L538 Other specified erythematous conditions: Secondary | ICD-10-CM | POA: Diagnosis not present

## 2014-07-31 DIAGNOSIS — I2699 Other pulmonary embolism without acute cor pulmonale: Secondary | ICD-10-CM | POA: Diagnosis not present

## 2014-07-31 DIAGNOSIS — M316 Other giant cell arteritis: Secondary | ICD-10-CM | POA: Diagnosis not present

## 2014-07-31 DIAGNOSIS — L97529 Non-pressure chronic ulcer of other part of left foot with unspecified severity: Principal | ICD-10-CM

## 2014-07-31 DIAGNOSIS — IMO0002 Reserved for concepts with insufficient information to code with codable children: Secondary | ICD-10-CM | POA: Diagnosis not present

## 2014-07-31 DIAGNOSIS — L0291 Cutaneous abscess, unspecified: Secondary | ICD-10-CM | POA: Diagnosis not present

## 2014-07-31 DIAGNOSIS — F329 Major depressive disorder, single episode, unspecified: Secondary | ICD-10-CM | POA: Diagnosis not present

## 2014-07-31 MED ORDER — DOXYCYCLINE HYCLATE 100 MG PO CAPS: 100.0000 mg | ORAL_CAPSULE | Freq: Two times a day (BID) | ORAL | Status: DC

## 2014-07-31 MED ORDER — HYDROCODONE-ACETAMINOPHEN 5-325 MG PO TABS: 1.0000 | ORAL_TABLET | Freq: Four times a day (QID) | ORAL | Status: DC | PRN

## 2014-07-31 NOTE — Progress Notes (Signed)
Patient ID: Joy Butler, female   DOB: 1951/01/23, 63 y.o.   MRN: [id number]    Subjective:   Patient ID: Joy Butler female   DOB: January 09, 1951 63 y.o.   MRN: [id number]  HPI: [name] is a 63 y.o. 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   She was recently hospitalized from 8/6-8/10 for non-traumatic left plantar diabetic foot ulcer where she received IV vancomycin (4 days) and zosyn (2 days) for total of 5 days of antibiotics (she had received PO clindamycin for 8 days prior to hospitalization which she had an allergy to). Her vancomycin trough level was elevated on 8/9 and vancomycin was consequently stopped. Her blood cultures have been negative to date and no wound cultures were obtained. MRI did not reveal evidence of osteomyelitis. She was discharged yesterday and was to take doxycyline for 5 more days however she was not given the prescription. She was seen by home health nurse today who told her the wound looked worse than prior to hospitalization and told her to come in to get it checked out "before her toe fell off." She reports there is increased redness and some bleeding at the edges of the wound with minimal drainage. She also has swelling of her left foot and leg that she feels is worse than yesterday. Recent LE doppler US revealed no DVT. Her pain is 8/10 and was not prescribed narcotics on discharge. She applied santyl cream to the wound today and has been wrapping it up. She has an appointment with wound care in 10 days on 8/21. She has been elevating her leg and using a boot, cane, and walker to ambulate. She can put partial pressure on her left foot. She reports falling last night.  She reports not having a  tetanus shot in the past 10 years. She reports fatigue but denies fever, chills, nausea, vomiting, abdominal pain, or diarrhea.   Her last A1c was 13.4 on 07/26/14. She reports compliance with taking Novolin 70/30 45U BID and metformin 500 mg BID. Her most recent CBG was 348 today. She denies symptomatic hypoglycemia and polyphagia but does report polydipsia, polyuria, blurry vision, and peripheral neuropathy. She reports poor diet and exercise. Her weight has been stable.   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 She denies headache or vision change from baseline.   She has history of recurrent PE and is on chronic coumadin which she reports compliance with. Her last INR was therapeutic on 8/10. She follows with Dr. Elie Confer for coumadin management.    Past Medical History  Diagnosis Date  . Hypertension   . Pulmonary embolism     2011 treated at [facility] in [place].  Was on Coumadin for over a year.  no known family history  . UTI (urinary tract infection)   . High cholesterol   . 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 Depression   . Neuropathy     feet  . Kidney stones   . Pneumonia     "several times" (07/26/2014)  . Type II diabetes mellitus dx'd 2000  . Daily headache   .  Migraine     "last one was in the 1990's" (07/26/2014)  . Arthritis     "knees" (07/26/2014)  . Chronic back pain    Current Outpatient Prescriptions  Medication Sig Dispense Refill  . amitriptyline (ELAVIL) 25 MG tablet Take 50 mg by mouth at bedtime.       Marland Kitchen amLODipine (NORVASC) 10 MG tablet Take 1 tablet (10 mg total) by mouth daily.  90 tablet  3  . citalopram (CELEXA) 20 MG tablet Take 20 mg by mouth daily.      . collagenase (SANTYL) ointment Apply topically daily.  30 g  0  . gabapentin (NEURONTIN) 300 MG capsule Take 300 mg by mouth 3 (three) times daily.      . insulin  NPH-regular Human (NOVOLIN 70/30) (70-30) 100 UNIT/ML injection Inject 45 Units into the skin 2 (two) times daily with a meal.       . losartan-hydrochlorothiazide (HYZAAR) 100-25 MG per tablet Take 1 tablet by mouth daily.      . metFORMIN (GLUCOPHAGE) 500 MG tablet Take 1 tablet (500 mg total) by mouth 2 (two) times daily with a meal.  60 tablet  2  . Multiple Vitamin (MULTIVITAMIN WITH MINERALS) TABS tablet Take 1 tablet by mouth daily.      . nicotine (NICODERM CQ - DOSED IN MG/24 HOURS) 14 mg/24hr patch Place 1 patch (14 mg total) onto the skin daily.  7 patch  0  . omeprazole (PRILOSEC) 20 MG capsule Take 1 capsule (20 mg total) by mouth daily.  90 capsule  3  . predniSONE (DELTASONE) 10 MG tablet Take 1-20 mg by mouth daily with breakfast. Tapered course: take 2 tablets (20 mg) daily until 07/25/14, take 1 1/2 tablets (15 mg) daily 07/26/14 thru 08/09/14, take 1 tablet (10 mg) daily 08/10/14 thru 02/17/15, then decrease by 1 mg every month until stopped      . traMADol (ULTRAM) 50 MG tablet Take 50 mg by mouth every 8 (eight) hours as needed (pain).      Marland Kitchen warfarin (COUMADIN) 5 MG tablet Take 10-12.5 mg by mouth every evening. Take 2 1/2 tablets (12.5 mg) on Monday and Thursday, take 2 tablets (10 mg) on all other days of the week       No current facility-administered medications for this visit.   Family History  Problem Relation Age of Onset  . Adopted: Yes  . Hypertension Mother    History   Social History  . Marital Status: Single    Spouse Name: N/A    Number of Children: N/A  . Years of Education: N/A   Social History Main Topics  . Smoking status: Current Every Day Smoker -- 1.00 packs/day for 44 years    Types: Cigarettes  . Smokeless tobacco: Never Used  . Alcohol Use: No  . Drug Use: No  . Sexual Activity: Yes   Other Topics Concern  . Not on file   Social History Narrative   Previously divorced now engaged with her partner of 4 years.  Has 6 grown children with 21  grandchildren.  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  Moved to [place] in August 2013 and was transiently homeless until first part of October 2013.     Review of Systems: Review of Systems  Constitutional: Positive for malaise/fatigue. Negative for fever, chills and weight loss.  HENT: Negative for congestion.   Eyes: Positive for blurred vision.  Respiratory: Negative  for cough, shortness of breath and wheezing.   Cardiovascular: Positive for leg swelling (bilateral (L>R)). Negative for chest pain and palpitations.  Gastrointestinal: Positive for abdominal pain ("soreness"). Negative for nausea, vomiting, diarrhea and constipation.  Genitourinary: Negative for dysuria, urgency and frequency.       Chronic polyuria  Musculoskeletal: Positive for back pain (chronic low back pain) and falls (last night).  Skin: Negative for rash.       Left plantar wound ulcer  Neurological: Positive for sensory change (chronic peripheral neuropathy). Negative for dizziness, focal weakness and headaches.  Endo/Heme/Allergies: Positive for polydipsia.    Objective:  Physical Exam: Filed Vitals:   07/31/14 1407  BP: 139/83  Pulse: 108  Temp: 98.1 F (36.7 C)  TempSrc: Oral  Height: 5\' 9"  (1.753 m)  Weight: 279 lb 11.2 oz (126.871 kg)  SpO2: 95%    Physical Exam  Constitutional: She is oriented to person, place, and time. She appears well-developed and well-nourished. No distress.  HENT:  Head: Normocephalic and atraumatic.  Eyes: EOM are normal.  Neck: Normal range of motion. Neck supple.  Cardiovascular: Normal rate, regular rhythm and normal heart sounds.   Pulmonary/Chest: Effort normal and breath sounds normal. No respiratory distress. She has no wheezes. She has no rales.  Abdominal: Soft. Bowel sounds are normal. She exhibits no distension. There is no tenderness. There is no rebound and no guarding.  Musculoskeletal: She exhibits edema (+2 left  LE, +1 right LE) and tenderness (left LE).  Neurological: She is alert and oriented to person, place, and time.  Skin: Skin is warm and dry. She is not diaphoretic.  5 cm x 3 cm left plantar wound ulcer below great toe with clear demarcation of granulation tissue at edges. Creamy white discharge overlying majority of wound. Area of cracked skin near wound.   Psychiatric: She has a normal mood and affect. Her behavior is normal. Judgment and thought content normal.    Assessment & Plan:   Please see problem list for problem-based assessment and plan

## 2014-07-31 NOTE — Telephone Encounter (Signed)
Thank you Helen. We will evaluate her this afternoon 

## 2014-07-31 NOTE — Assessment & Plan Note (Signed)
Assessment: Pt with hyperlipidemia and hypertriglyceridemia on last lipid panel on 10/12/12 not currently on statin therapy with 10-yr ASCVD risk of 48.4% with recommendations to start moderate to high intensity statin therapy.  Plan: -Obtain annual lipid panel -Consider starting moderate to high intensity statin at next visit

## 2014-07-31 NOTE — Assessment & Plan Note (Signed)
Pt received tdap vaccination on 07/31/14.

## 2014-07-31 NOTE — Assessment & Plan Note (Addendum)
Assessment: Pt with 1st episode of non-traumatic diabetic left plantar wound ulcer with recent hospitalization s/p 5 days of IV antibiotics with negative blood cultures to date who presents with well-healing wound  in setting of hyperglycemia and chronic corticosteroid use.     Plan: -Dr. Genene Churn and Dr. Hayes Ludwig came to evaluate her wound and reported it appeared improved from 1 day ago. Per Dr Hayes Ludwig, infectious disease attending Dr. Baxter Flattery had approved with discharging her and thought the wound was healing well.  -Prescribe doxycyline 100 mg BID for 5 days per discharge instructions (total of 10 days of antibiotic therapy)  -Obtain vancomycin trough level and BMP due to recent elevated vancomycin trough levels   -Follow-up final blood culture reports (no growth to date) -Hydrocodone-acetaminophen 5-325 mg Q 6 hr PRN pain (20 pills with zero refills) -Continue home health RN wound care  -Continue frequent dressing changes and santyl ointment to wound daily -Pt advised on importance of controlling her blood sugar -Yvonna Alanis, RN washed wound with sterile saline, applied bacitracin ointment, and dressed wound with dry dressing. Pt was instructed to apply santyl ointment upon arriving home. She reported minimal grey-yellow drainage from wound. -Administer tdap vaccination (pt reported last tetanus shot was >10 yrs ago) -Pt to return in 2 days and follow-up with wound care on 08/10/14

## 2014-07-31 NOTE — Progress Notes (Signed)
INTERNAL MEDICINE TEACHING ATTENDING ADDENDUM - Aldine Contes, MD: I personally saw and evaluated Joy Butler in this clinic visit in conjunction with the resident, Dr. Aundra Dubin. I have discussed patient's plan of care with medical resident during this visit. I have confirmed the physical exam findings and have read and agree with the clinic note including the plan with the following addition: On exam- Left foot ulcer with serosanguinous drainage, no granulation tissue - Will admit to hospital for IV abx as failed outpatinet course - Check MRI - r/o osteomyelitis - Ortho consult for debridement

## 2014-07-31 NOTE — Assessment & Plan Note (Signed)
Assessment: Pt with last A1c of 13.4 on 07/26/14 compliant with insulin and oral hypoglycemic therapy with no recent symptomatic hypoglycemia who presents with recent CBG at home of 348.   Plan: -A1c 13.4 not at goal <7, continue Novolin 70/30 45 U BID with meals and metformin 500 mg BID. Pt instructed to bring glucose meter at next visit for further insulin adjustment and consider uptitrating metformin.  -BP 139/83 at goal <140/90, continue amlodipine 10 mg daily and losartan-HCTZ 100-25 mg daily  -Obtain annaul lipid panel, last LDL 133 not at goal <100, not currently on statin therapy -Last annual foot exam on 02/07/14 -Pt due for annual eye exam (last one 02/28/13) -Continue gabapentin 300 mg TID for peripheral neuropathy -BMI 41.29 not at goal <30, encourage weight loss

## 2014-07-31 NOTE — Telephone Encounter (Signed)
Pt is very non compliant Home from hosp last pm No blood sugar since disch Foot wound looks much worse No wound care orders for HHN VS- BP 138/70, HR 92, R 18, T 97.6 HHN instructed pt to check CBG as we spoke, had a "swig" of regular coke, and had a cigarette, CBG at 1125 is (410) appt given for 1415 dr Naaman Plummer Pt did not have any idea what dose of insulin to take, HHN read disch instructions for dose, at 1128 taking 45 units Novolin 70/30, has not taken metformin this am

## 2014-07-31 NOTE — Patient Instructions (Signed)
-  Take doxycyline 100 mg twice a day for 5 days -Take norco/vicodin every 6 hrs as needed for pain -Will check your bloodwork today -Will give you a tdap shot today -Continue wound care with santyl cream -Keep elevating your leg and not putting weight on it  -Your appt with wound care is on 8/21 -Will see you back on the 13th, nice meeting you!  General Instructions:   Please bring your medicines with you each time you come to clinic.  Medicines may include prescription medications, over-the-counter medications, herbal remedies, eye drops, vitamins, or other pills.   Progress Toward Treatment Goals:  Treatment Goal 07/26/2014  Hemoglobin A1C deteriorated  Blood pressure deteriorated  Stop smoking -  Prevent falls -    Self Care Goals & Plans:  Self Care Goal 07/31/2014  Manage my medications take my medicines as prescribed; bring my medications to every visit; refill my medications on time; follow the sick day instructions if I am sick  Monitor my health keep track of my blood glucose; keep track of my weight; check my feet daily  Eat healthy foods eat more vegetables; eat baked foods instead of fried foods; eat foods that are low in salt; eat fruit for snacks and desserts; eat smaller portions; drink diet soda or water instead of juice or soda  Be physically active find an activity I enjoy  Stop smoking -  Prevent falls -  Meeting treatment goals -    Home Blood Glucose Monitoring 07/26/2014  Check my blood sugar 3 times a day  When to check my blood sugar before meals     Care Management & Community Referrals:  Referral 07/26/2014  Referrals made for care management support none needed  Referrals made to community resources none

## 2014-07-31 NOTE — Assessment & Plan Note (Signed)
Assessment: Pt with moderately well-controlled hypertension compliant with three-class (CCB, ARB, diuretic) anti-hypertensive therapy who presents with blood pressure of 139/83.   Plan: -BP 139/83 at goal <140/90 -Continue amlodipine 10 mg daily and losartan-HCTZ 100-25 mg daily  -Obtain BMP

## 2014-08-01 LAB — BASIC METABOLIC PANEL WITH GFR
BUN: 24 mg/dL — ABNORMAL HIGH (ref 6–23)
CO2: 29 meq/L (ref 19–32)
Calcium: 8.7 mg/dL (ref 8.4–10.5)
Chloride: 102 mEq/L (ref 96–112)
Creat: 1.37 mg/dL — ABNORMAL HIGH (ref 0.50–1.10)
GFR, EST NON AFRICAN AMERICAN: 41 mL/min — AB
GFR, Est African American: 47 mL/min — ABNORMAL LOW
Glucose, Bld: 165 mg/dL — ABNORMAL HIGH (ref 70–99)
POTASSIUM: 4 meq/L (ref 3.5–5.3)
Sodium: 141 mEq/L (ref 135–145)

## 2014-08-01 LAB — CULTURE, BLOOD (ROUTINE X 2)
Culture: NO GROWTH
Culture: NO GROWTH

## 2014-08-01 LAB — LIPID PANEL
CHOLESTEROL: 276 mg/dL — AB (ref 0–200)
HDL: 72 mg/dL (ref >39)
LDL CALC: 162 mg/dL — AB (ref 0–99)
TRIGLYCERIDES: 208 mg/dL — AB (ref <150)
Total CHOL/HDL Ratio: 3.8 Ratio
VLDL: 42 mg/dL — AB (ref 0–40)

## 2014-08-01 LAB — VANCOMYCIN, TROUGH: VANCOMYCIN TR: 9.3 ug/mL — AB (ref 10.0–20.0)

## 2014-08-01 NOTE — Progress Notes (Signed)
I saw and evaluated the patient.  I personally confirmed the key portions of Dr. Rabbani's history and exam and reviewed pertinent patient test results.  The assessment, diagnosis, and plan were formulated together and I agree with the documentation in the resident's note. 

## 2014-08-02 ENCOUNTER — Telehealth: Payer: Self-pay | Admitting: *Deleted

## 2014-08-02 ENCOUNTER — Ambulatory Visit (INDEPENDENT_AMBULATORY_CARE_PROVIDER_SITE_OTHER): Payer: Medicare Other | Admitting: Internal Medicine

## 2014-08-02 ENCOUNTER — Encounter: Payer: Self-pay | Admitting: Internal Medicine

## 2014-08-02 VITALS — BP 138/82 | HR 100 | Temp 97.8°F | Ht 69.0 in | Wt 276.6 lb

## 2014-08-02 DIAGNOSIS — E1369 Other specified diabetes mellitus with other specified complication: Secondary | ICD-10-CM

## 2014-08-02 DIAGNOSIS — I2699 Other pulmonary embolism without acute cor pulmonale: Secondary | ICD-10-CM | POA: Diagnosis not present

## 2014-08-02 DIAGNOSIS — M316 Other giant cell arteritis: Secondary | ICD-10-CM | POA: Diagnosis not present

## 2014-08-02 DIAGNOSIS — E7800 Pure hypercholesterolemia, unspecified: Secondary | ICD-10-CM | POA: Diagnosis not present

## 2014-08-02 DIAGNOSIS — L538 Other specified erythematous conditions: Secondary | ICD-10-CM | POA: Diagnosis not present

## 2014-08-02 DIAGNOSIS — E785 Hyperlipidemia, unspecified: Secondary | ICD-10-CM

## 2014-08-02 DIAGNOSIS — L304 Erythema intertrigo: Secondary | ICD-10-CM

## 2014-08-02 DIAGNOSIS — L0390 Cellulitis, unspecified: Secondary | ICD-10-CM | POA: Diagnosis not present

## 2014-08-02 DIAGNOSIS — E119 Type 2 diabetes mellitus without complications: Secondary | ICD-10-CM | POA: Diagnosis not present

## 2014-08-02 DIAGNOSIS — L02619 Cutaneous abscess of unspecified foot: Secondary | ICD-10-CM | POA: Diagnosis not present

## 2014-08-02 DIAGNOSIS — F329 Major depressive disorder, single episode, unspecified: Secondary | ICD-10-CM | POA: Diagnosis not present

## 2014-08-02 DIAGNOSIS — L03119 Cellulitis of unspecified part of limb: Secondary | ICD-10-CM | POA: Diagnosis not present

## 2014-08-02 DIAGNOSIS — Z7901 Long term (current) use of anticoagulants: Secondary | ICD-10-CM | POA: Diagnosis not present

## 2014-08-02 DIAGNOSIS — J069 Acute upper respiratory infection, unspecified: Secondary | ICD-10-CM | POA: Diagnosis not present

## 2014-08-02 DIAGNOSIS — D892 Hypergammaglobulinemia, unspecified: Secondary | ICD-10-CM | POA: Diagnosis not present

## 2014-08-02 DIAGNOSIS — F3289 Other specified depressive episodes: Secondary | ICD-10-CM | POA: Diagnosis not present

## 2014-08-02 DIAGNOSIS — Z794 Long term (current) use of insulin: Secondary | ICD-10-CM | POA: Diagnosis not present

## 2014-08-02 DIAGNOSIS — L97509 Non-pressure chronic ulcer of other part of unspecified foot with unspecified severity: Secondary | ICD-10-CM | POA: Diagnosis not present

## 2014-08-02 DIAGNOSIS — E1165 Type 2 diabetes mellitus with hyperglycemia: Secondary | ICD-10-CM | POA: Diagnosis not present

## 2014-08-02 DIAGNOSIS — R269 Unspecified abnormalities of gait and mobility: Secondary | ICD-10-CM | POA: Diagnosis not present

## 2014-08-02 DIAGNOSIS — E08621 Diabetes mellitus due to underlying condition with foot ulcer: Secondary | ICD-10-CM

## 2014-08-02 DIAGNOSIS — L97529 Non-pressure chronic ulcer of other part of left foot with unspecified severity: Principal | ICD-10-CM

## 2014-08-02 DIAGNOSIS — IMO0002 Reserved for concepts with insufficient information to code with codable children: Secondary | ICD-10-CM | POA: Diagnosis not present

## 2014-08-02 DIAGNOSIS — M549 Dorsalgia, unspecified: Secondary | ICD-10-CM | POA: Diagnosis not present

## 2014-08-02 DIAGNOSIS — M7989 Other specified soft tissue disorders: Secondary | ICD-10-CM | POA: Diagnosis not present

## 2014-08-02 DIAGNOSIS — L0291 Cutaneous abscess, unspecified: Secondary | ICD-10-CM | POA: Diagnosis not present

## 2014-08-02 DIAGNOSIS — I10 Essential (primary) hypertension: Secondary | ICD-10-CM | POA: Diagnosis not present

## 2014-08-02 DIAGNOSIS — E1149 Type 2 diabetes mellitus with other diabetic neurological complication: Secondary | ICD-10-CM | POA: Diagnosis not present

## 2014-08-02 DIAGNOSIS — Z86711 Personal history of pulmonary embolism: Secondary | ICD-10-CM | POA: Diagnosis not present

## 2014-08-02 DIAGNOSIS — E1142 Type 2 diabetes mellitus with diabetic polyneuropathy: Secondary | ICD-10-CM | POA: Diagnosis not present

## 2014-08-02 DIAGNOSIS — IMO0001 Reserved for inherently not codable concepts without codable children: Secondary | ICD-10-CM | POA: Diagnosis not present

## 2014-08-02 MED ORDER — METFORMIN HCL 500 MG PO TABS: 1000.0000 mg | ORAL_TABLET | Freq: Two times a day (BID) | ORAL | Status: DC

## 2014-08-02 MED ORDER — NYSTATIN 100000 UNIT/GM EX CREA: 1.0000 "application " | TOPICAL_CREAM | Freq: Two times a day (BID) | CUTANEOUS | Status: DC

## 2014-08-02 MED ORDER — PRAVASTATIN SODIUM 40 MG PO TABS: 40.0000 mg | ORAL_TABLET | Freq: Every evening | ORAL | Status: DC

## 2014-08-02 MED ORDER — COLLAGENASE 250 UNIT/GM EX OINT: TOPICAL_OINTMENT | Freq: Once | CUTANEOUS | Status: DC

## 2014-08-02 MED ORDER — HYDROCODONE-ACETAMINOPHEN 5-325 MG PO TABS: 1.0000 | ORAL_TABLET | Freq: Four times a day (QID) | ORAL | Status: DC | PRN

## 2014-08-02 NOTE — Telephone Encounter (Signed)
Call from [facility] with [facility] # [phone]; Nurse called with concerns about wound to left foot wound.  It does not seem to be healing and center of wound is black.  Pt has been using 10% zinc barrier since last visit in clinic. She states she was told it was okay to use this.   Nurse is calling for wound care orders, she does not have any.  Pt has scheduled appointment today in clinic.   I called [facility] as an order for Santyl Ointment was sent to them on 8/10, this was not filled or sent to patient.  Oversight.  They will send it out and it should arrive tomorrow at 1200. Pt has appointment at wound center on 8/21.

## 2014-08-02 NOTE — Assessment & Plan Note (Addendum)
Assessment: Pt with 1st episode of non-traumatic diabetic left plantar wound ulcer with recent hospitalization s/p 5 days of IV antibiotics with negative blood cultures who presents with well-healing wound in setting of uncontrolled Type II DM and chronic corticosteroid use.   Plan:  -Continue doxycyline 100 mg BID for 5 days (total of 10 days of antibiotic therapy)  -Hydrocodone-acetaminophen 5-325 mg Q 6 hr PRN pain (20 pills with zero refills to be filled on or after 08/05/14)  -Continue frequent dressing changes and start santyl ointment to wound daily (pt given santyl ointment in clinic today). Discontinue zinc oxide topical application.    -Continue home health RN wound care  -Pt advised on importance of controlling her blood sugar  -Obtain BMP at next visit due to recent elevated vancomycin trough levels and AKI   -Pt to return in 1 week after wound care appointment on 08/10/14

## 2014-08-02 NOTE — Assessment & Plan Note (Addendum)
Assessment: Pt with moderately well-controlled hypertension compliant with three-class (CCB, ARB, diuretic) anti-hypertensive therapy who presents with blood pressure of 138/82.   Plan:  -BP 138/82 at goal <140/90  -Continue amlodipine 10 mg daily and losartan-HCTZ 100-25 mg daily  -Last BMP on 07/31/14 with increased Cr, repeat at next visit, if not improved consider holding losartan-HCTZ

## 2014-08-02 NOTE — Patient Instructions (Addendum)
-  Keep taking your antibiotic, doxycyline until it is finished  -Apply santyl cream to your left wound ulcer every day and change dressing every day. STOP using zinc oxide to the area. -Start taking pravastatin 40 mg daily for high cholesterol  -Increase your metformin from 500 mg twice a day to 1000 mg twice a day -Apply nystatin cream to your lower abdomen/groin area twice a day until symptoms improve -Will see you back in 1 week  General Instructions:   Please bring your medicines with you each time you come to clinic.  Medicines may include prescription medications, over-the-counter medications, herbal remedies, eye drops, vitamins, or other pills.   Progress Toward Treatment Goals:  Treatment Goal 07/26/2014  Hemoglobin A1C deteriorated  Blood pressure deteriorated  Stop smoking -  Prevent falls -    Self Care Goals & Plans:  Self Care Goal 07/31/2014  Manage my medications take my medicines as prescribed; bring my medications to every visit; refill my medications on time; follow the sick day instructions if I am sick  Monitor my health keep track of my blood glucose; keep track of my weight; check my feet daily  Eat healthy foods eat more vegetables; eat baked foods instead of fried foods; eat foods that are low in salt; eat fruit for snacks and desserts; eat smaller portions; drink diet soda or water instead of juice or soda  Be physically active find an activity I enjoy  Stop smoking -  Prevent falls -  Meeting treatment goals -    Home Blood Glucose Monitoring 07/26/2014  Check my blood sugar 3 times a day  When to check my blood sugar before meals     Care Management & Community Referrals:  Referral 07/26/2014  Referrals made for care management support none needed  Referrals made to community resources none

## 2014-08-02 NOTE — Assessment & Plan Note (Signed)
Assessment: Pt with uncontrolled Type II DM with pruritis in pannus and groin region most likely due to candidal intertrigo.    Plan:  -Prescribe nystatin cream to affected area BID until resolution -Monitor for resolution

## 2014-08-02 NOTE — Assessment & Plan Note (Signed)
Assessment: Pt with last A1c of 13.4 on 07/26/14 compliant with insulin and oral hypoglycemic therapy with no recent symptomatic hypoglycemia who presents with recent CBG range at home of 100-200's.    Plan:  -A1c 13.4 not at goal <7, increase metformin from 500 mg BID to 1000 mg BID and continue Novolin 70/30 45 U BID with meals. Pt instructed to bring glucose meter at next visit for further insulin adjustment.  -BP 138/82 at goal <140/90, continue amlodipine 10 mg daily and losartan-HCTZ 100-25 mg daily  -LDL 162 not at goal <100, start pravastatin 40 mg daily  -Last annual foot exam on 02/07/14  -Pt due for annual eye exam (last one 02/28/13)  -Continue gabapentin 300 mg TID for peripheral neuropathy  -BMI 40.83 not at goal <30, encourage weight loss

## 2014-08-02 NOTE — Telephone Encounter (Signed)
Pt to be seen in clinic today,

## 2014-08-02 NOTE — Progress Notes (Signed)
Patient ID: Joy Butler, female   DOB: 1951/05/15, 63 y.o.   MRN: [id number]    Subjective:   Patient ID: Joy Butler female   DOB: 02/07/1951 63 y.o.   MRN: [id number]  HPI: [name] is a 63 y.o. 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   She has been taking doxycyline as prescribed for the past 2 days and has 3 days left. She reports that home health nurse told her that her wound was not healing because it appeared black. She reports being given a bag at discharge that contained zinc oxide cream which she has been applying to the wound daily with dressing changes. She has not been using the santyl cream but her husband recalls it was in the bag as well but they did not understand they were supposed to use it. She denies fever, chills, discharge or bleeding from the wound. Her left LE (as well as right LE) continues to be swollen with some improvement with elevation. Her pain is well-controlled on hydrocodone-acetaminophen which she takes every 6 hours. She continues to use a cane or walker and denies recent fall.  She reports having itching without pain under her abdomen and groin area. She had similar problem under her breasts and was given a cream for it.   Her last A1c was 13.4 on 07/26/14. She reports compliance with taking Novolin 70/30 45U BID and metformin 500 mg BID. She did not bring her meter in today but reports values in 100-200's.  She denies symptomatic hypoglycemia and polyphagia but does report chronic polydipsia, polyuria, blurry vision, and peripheral neuropathy. She reports poor diet and exercise. Her weight has been stable.       Past Medical History  Diagnosis Date  . Hypertension   . Pulmonary embolism    2011 treated at [facility] in [place].  Was on Coumadin for over a year.  no known family history  . UTI (urinary tract infection)   . High cholesterol   . 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 Depression   . Neuropathy     feet  . Kidney stones   . Pneumonia     "several times" (07/26/2014)  . Type II diabetes mellitus dx'd 2000  . Daily headache   . Migraine     "last one was in the 1990's" (07/26/2014)  . Arthritis     "knees" (07/26/2014)  . Chronic back pain    Current Outpatient Prescriptions  Medication Sig Dispense Refill  . amitriptyline (ELAVIL) 25 MG tablet Take 50 mg by mouth at bedtime.       Marland Kitchen amLODipine (NORVASC) 10 MG tablet Take 1 tablet (10 mg total) by mouth daily.  90 tablet  3  . citalopram (CELEXA) 20 MG tablet Take 20 mg by mouth daily.      . collagenase (SANTYL) ointment Apply topically daily.  30 g  0  . doxycycline (VIBRAMYCIN) 100 MG capsule Take 1 capsule (100 mg total) by mouth 2 (two) times daily. One po bid x 5 days  10 capsule  0  . gabapentin (NEURONTIN) 300 MG capsule Take 300 mg by mouth 3 (three) times daily.      Marland Kitchen HYDROcodone-acetaminophen (NORCO/VICODIN) 5-325 MG per tablet Take  1 tablet by mouth every 6 (six) hours as needed for moderate pain.  20 tablet  0  . insulin NPH-regular Human (NOVOLIN 70/30) (70-30) 100 UNIT/ML injection Inject 45 Units into the skin 2 (two) times daily with a meal.       . losartan-hydrochlorothiazide (HYZAAR) 100-25 MG per tablet Take 1 tablet by mouth daily.      . metFORMIN (GLUCOPHAGE) 500 MG tablet Take 1 tablet (500 mg total) by mouth 2 (two) times daily with a meal.  60 tablet  2  . Multiple Vitamin (MULTIVITAMIN WITH MINERALS) TABS tablet Take 1 tablet by mouth daily.      . nicotine (NICODERM CQ - DOSED IN MG/24 HOURS) 14 mg/24hr patch Place 1 patch (14 mg total) onto the skin daily.  7 patch  0  . omeprazole (PRILOSEC) 20 MG capsule Take 1 capsule (20 mg total) by mouth daily.  90 capsule  3  .  predniSONE (DELTASONE) 10 MG tablet Take 1-20 mg by mouth daily with breakfast. Tapered course: take 2 tablets (20 mg) daily until 07/25/14, take 1 1/2 tablets (15 mg) daily 07/26/14 thru 08/09/14, take 1 tablet (10 mg) daily 08/10/14 thru 02/17/15, then decrease by 1 mg every month until stopped      . traMADol (ULTRAM) 50 MG tablet Take 50 mg by mouth every 8 (eight) hours as needed (pain).      Marland Kitchen warfarin (COUMADIN) 5 MG tablet Take 10-12.5 mg by mouth every evening. Take 2 1/2 tablets (12.5 mg) on Monday and Thursday, take 2 tablets (10 mg) on all other days of the week       Current Facility-Administered Medications  Medication Dose Route Frequency Provider Last Rate Last Dose  . collagenase (SANTYL) ointment   Topical Once Juluis Mire, MD       Family History  Problem Relation Age of Onset  . Adopted: Yes  . Hypertension Mother    History   Social History  . Marital Status: Single    Spouse Name: N/A    Number of Children: N/A  . Years of Education: N/A   Social History Main Topics  . Smoking status: Current Every Day Smoker -- 1.00 packs/day for 44 years    Types: Cigarettes  . Smokeless tobacco: Never Used  . Alcohol Use: No  . Drug Use: No  . Sexual Activity: Yes   Other Topics Concern  . Not on file   Social History Narrative   Previously divorced now engaged with her partner of 4 years.  Has 6 grown children with 21 grandchildren.  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  Moved to [place] in August 2013 and was transiently homeless until first part of October 2013.     Review of Systems: Review of Systems  Constitutional: Negative for fever and chills.  Eyes: Positive for blurred vision (chronic).  Respiratory: Negative for cough and shortness of breath.   Cardiovascular: Positive for leg swelling (L>R). Negative for chest pain.  Gastrointestinal: Positive for abdominal pain. Negative for nausea, vomiting, diarrhea and  constipation.  Genitourinary: Negative for dysuria and urgency.       Chronic polyuria  Musculoskeletal: Negative for falls.  Skin: Positive for itching (under pannus and groin) and rash (under breasts).       Left plantar wound ulcer  Neurological: Positive for sensory change (chronic peripheral neuropathy).  Endo/Heme/Allergies: Positive for polydipsia.    Objective:  Physical Exam: Filed Vitals:  08/02/14 1507  BP: 138/82  Pulse: 100  Temp: 97.8 F (36.6 C)  TempSrc: Oral  Height: 5\' 9"  (1.753 m)  Weight: 276 lb 9.6 oz (125.465 kg)  SpO2: 99%   Physical Exam  Constitutional: She is oriented to person, place, and time. She appears well-developed and well-nourished. No distress.  HENT:  Head: Normocephalic and atraumatic.  Eyes: EOM are normal.  Neck: Normal range of motion. Neck supple.  Cardiovascular: Normal rate and regular rhythm.   Pulmonary/Chest: Breath sounds normal. No respiratory distress. She has no wheezes. She has no rales.  Abdominal: Soft. Bowel sounds are normal. She exhibits no distension. There is no tenderness. There is no rebound and no guarding.  Musculoskeletal: She exhibits edema (+2 left LE +1 right LE) and tenderness (left LE).  Neurological: She is alert and oriented to person, place, and time.  Skin: Skin is warm and dry. No rash noted. She is not diaphoretic. No erythema. No pallor.  Unable to assess pannus or groin region. 5 cm x 3 cm left plantar wound ulcer below great toe creamy white-yellow in center with clear demarcation of granulation tissue at edges and small circular area of darkness. Adjacent skin cracked with discoloration.    Psychiatric: She has a normal mood and affect. Her behavior is normal. Judgment and thought content normal.    Assessment & Plan:   Please see problem list for problem-based assessment and plan

## 2014-08-02 NOTE — Assessment & Plan Note (Addendum)
Assessment: Pt with hyperlipidemia and hypertriglyceridemia on last lipid panel on 07/31/14 not currently on statin therapy with 10-yr ASCVD risk of 47.7% with recommendations to start moderate to high intensity statin therapy.   Plan:   -Prescribe pravastatin 40 mg daily -Last CMP on 07/26/14 with mildly elevated AST of 42, repeat at next visit -Monitor for myalgias

## 2014-08-06 NOTE — Progress Notes (Signed)
Case discussed with Dr. Rabbani at the time of the visit.  We reviewed the resident's history and exam and pertinent patient test results.  I agree with the assessment, diagnosis, and plan of care documented in the resident's note. 

## 2014-08-07 DIAGNOSIS — Z794 Long term (current) use of insulin: Secondary | ICD-10-CM | POA: Diagnosis not present

## 2014-08-07 DIAGNOSIS — I10 Essential (primary) hypertension: Secondary | ICD-10-CM | POA: Diagnosis not present

## 2014-08-07 DIAGNOSIS — Z7901 Long term (current) use of anticoagulants: Secondary | ICD-10-CM | POA: Diagnosis not present

## 2014-08-07 DIAGNOSIS — IMO0001 Reserved for inherently not codable concepts without codable children: Secondary | ICD-10-CM | POA: Diagnosis not present

## 2014-08-07 DIAGNOSIS — Z86711 Personal history of pulmonary embolism: Secondary | ICD-10-CM | POA: Diagnosis not present

## 2014-08-07 DIAGNOSIS — M316 Other giant cell arteritis: Secondary | ICD-10-CM | POA: Diagnosis not present

## 2014-08-08 ENCOUNTER — Encounter: Payer: Self-pay | Admitting: Internal Medicine

## 2014-08-08 DIAGNOSIS — Z794 Long term (current) use of insulin: Secondary | ICD-10-CM | POA: Diagnosis not present

## 2014-08-08 DIAGNOSIS — E11319 Type 2 diabetes mellitus with unspecified diabetic retinopathy without macular edema: Secondary | ICD-10-CM | POA: Insufficient documentation

## 2014-08-08 DIAGNOSIS — Z7901 Long term (current) use of anticoagulants: Secondary | ICD-10-CM | POA: Diagnosis not present

## 2014-08-08 DIAGNOSIS — IMO0001 Reserved for inherently not codable concepts without codable children: Secondary | ICD-10-CM | POA: Diagnosis not present

## 2014-08-08 DIAGNOSIS — M316 Other giant cell arteritis: Secondary | ICD-10-CM | POA: Diagnosis not present

## 2014-08-08 DIAGNOSIS — I10 Essential (primary) hypertension: Secondary | ICD-10-CM | POA: Diagnosis not present

## 2014-08-08 DIAGNOSIS — Z86711 Personal history of pulmonary embolism: Secondary | ICD-10-CM | POA: Diagnosis not present

## 2014-08-10 ENCOUNTER — Encounter (HOSPITAL_BASED_OUTPATIENT_CLINIC_OR_DEPARTMENT_OTHER): Payer: Medicare Other | Attending: General Surgery

## 2014-08-10 DIAGNOSIS — E1169 Type 2 diabetes mellitus with other specified complication: Secondary | ICD-10-CM | POA: Diagnosis not present

## 2014-08-10 DIAGNOSIS — F3289 Other specified depressive episodes: Secondary | ICD-10-CM | POA: Diagnosis not present

## 2014-08-10 DIAGNOSIS — Z79899 Other long term (current) drug therapy: Secondary | ICD-10-CM | POA: Insufficient documentation

## 2014-08-10 DIAGNOSIS — Z7901 Long term (current) use of anticoagulants: Secondary | ICD-10-CM | POA: Diagnosis not present

## 2014-08-10 DIAGNOSIS — Z794 Long term (current) use of insulin: Secondary | ICD-10-CM | POA: Insufficient documentation

## 2014-08-10 DIAGNOSIS — IMO0002 Reserved for concepts with insufficient information to code with codable children: Secondary | ICD-10-CM | POA: Insufficient documentation

## 2014-08-10 DIAGNOSIS — Z86711 Personal history of pulmonary embolism: Secondary | ICD-10-CM | POA: Diagnosis not present

## 2014-08-10 DIAGNOSIS — I10 Essential (primary) hypertension: Secondary | ICD-10-CM | POA: Insufficient documentation

## 2014-08-10 DIAGNOSIS — L97409 Non-pressure chronic ulcer of unspecified heel and midfoot with unspecified severity: Secondary | ICD-10-CM | POA: Insufficient documentation

## 2014-08-10 DIAGNOSIS — F329 Major depressive disorder, single episode, unspecified: Secondary | ICD-10-CM | POA: Insufficient documentation

## 2014-08-10 LAB — GLUCOSE, CAPILLARY: Glucose-Capillary: 285 mg/dL — ABNORMAL HIGH (ref 70–99)

## 2014-08-14 DIAGNOSIS — I10 Essential (primary) hypertension: Secondary | ICD-10-CM | POA: Diagnosis not present

## 2014-08-14 DIAGNOSIS — Z86711 Personal history of pulmonary embolism: Secondary | ICD-10-CM | POA: Diagnosis not present

## 2014-08-14 DIAGNOSIS — Z7901 Long term (current) use of anticoagulants: Secondary | ICD-10-CM | POA: Diagnosis not present

## 2014-08-14 DIAGNOSIS — Z794 Long term (current) use of insulin: Secondary | ICD-10-CM | POA: Diagnosis not present

## 2014-08-14 DIAGNOSIS — IMO0001 Reserved for inherently not codable concepts without codable children: Secondary | ICD-10-CM | POA: Diagnosis not present

## 2014-08-14 DIAGNOSIS — M316 Other giant cell arteritis: Secondary | ICD-10-CM | POA: Diagnosis not present

## 2014-08-15 ENCOUNTER — Encounter: Payer: Self-pay | Admitting: Internal Medicine

## 2014-08-15 ENCOUNTER — Encounter: Payer: Medicare Other | Admitting: Internal Medicine

## 2014-08-16 NOTE — Addendum Note (Signed)
Addended by: Truddie Crumble on: 08/16/2014 11:48 AM   Modules accepted: Orders

## 2014-08-18 DIAGNOSIS — I10 Essential (primary) hypertension: Secondary | ICD-10-CM | POA: Diagnosis not present

## 2014-08-18 DIAGNOSIS — Z7901 Long term (current) use of anticoagulants: Secondary | ICD-10-CM | POA: Diagnosis not present

## 2014-08-18 DIAGNOSIS — IMO0001 Reserved for inherently not codable concepts without codable children: Secondary | ICD-10-CM | POA: Diagnosis not present

## 2014-08-18 DIAGNOSIS — Z86711 Personal history of pulmonary embolism: Secondary | ICD-10-CM | POA: Diagnosis not present

## 2014-08-18 DIAGNOSIS — M316 Other giant cell arteritis: Secondary | ICD-10-CM | POA: Diagnosis not present

## 2014-08-18 DIAGNOSIS — Z794 Long term (current) use of insulin: Secondary | ICD-10-CM | POA: Diagnosis not present

## 2014-08-21 DIAGNOSIS — M316 Other giant cell arteritis: Secondary | ICD-10-CM | POA: Diagnosis not present

## 2014-08-21 DIAGNOSIS — IMO0001 Reserved for inherently not codable concepts without codable children: Secondary | ICD-10-CM | POA: Diagnosis not present

## 2014-08-21 DIAGNOSIS — Z86711 Personal history of pulmonary embolism: Secondary | ICD-10-CM | POA: Diagnosis not present

## 2014-08-21 DIAGNOSIS — Z794 Long term (current) use of insulin: Secondary | ICD-10-CM | POA: Diagnosis not present

## 2014-08-21 DIAGNOSIS — Z7901 Long term (current) use of anticoagulants: Secondary | ICD-10-CM | POA: Diagnosis not present

## 2014-08-21 DIAGNOSIS — I10 Essential (primary) hypertension: Secondary | ICD-10-CM | POA: Diagnosis not present

## 2014-08-23 DIAGNOSIS — M316 Other giant cell arteritis: Secondary | ICD-10-CM | POA: Diagnosis not present

## 2014-08-23 DIAGNOSIS — Z794 Long term (current) use of insulin: Secondary | ICD-10-CM | POA: Diagnosis not present

## 2014-08-23 DIAGNOSIS — Z86711 Personal history of pulmonary embolism: Secondary | ICD-10-CM | POA: Diagnosis not present

## 2014-08-23 DIAGNOSIS — IMO0001 Reserved for inherently not codable concepts without codable children: Secondary | ICD-10-CM | POA: Diagnosis not present

## 2014-08-23 DIAGNOSIS — I10 Essential (primary) hypertension: Secondary | ICD-10-CM | POA: Diagnosis not present

## 2014-08-23 DIAGNOSIS — Z7901 Long term (current) use of anticoagulants: Secondary | ICD-10-CM | POA: Diagnosis not present

## 2014-08-27 DIAGNOSIS — Z86711 Personal history of pulmonary embolism: Secondary | ICD-10-CM | POA: Diagnosis not present

## 2014-08-27 DIAGNOSIS — Z794 Long term (current) use of insulin: Secondary | ICD-10-CM | POA: Diagnosis not present

## 2014-08-27 DIAGNOSIS — IMO0001 Reserved for inherently not codable concepts without codable children: Secondary | ICD-10-CM | POA: Diagnosis not present

## 2014-08-27 DIAGNOSIS — Z7901 Long term (current) use of anticoagulants: Secondary | ICD-10-CM | POA: Diagnosis not present

## 2014-08-27 DIAGNOSIS — M316 Other giant cell arteritis: Secondary | ICD-10-CM | POA: Diagnosis not present

## 2014-08-27 DIAGNOSIS — I10 Essential (primary) hypertension: Secondary | ICD-10-CM | POA: Diagnosis not present

## 2014-08-29 DIAGNOSIS — M316 Other giant cell arteritis: Secondary | ICD-10-CM | POA: Diagnosis not present

## 2014-08-29 DIAGNOSIS — Z7901 Long term (current) use of anticoagulants: Secondary | ICD-10-CM | POA: Diagnosis not present

## 2014-08-29 DIAGNOSIS — I10 Essential (primary) hypertension: Secondary | ICD-10-CM | POA: Diagnosis not present

## 2014-08-29 DIAGNOSIS — Z794 Long term (current) use of insulin: Secondary | ICD-10-CM | POA: Diagnosis not present

## 2014-08-29 DIAGNOSIS — Z86711 Personal history of pulmonary embolism: Secondary | ICD-10-CM | POA: Diagnosis not present

## 2014-08-29 DIAGNOSIS — IMO0001 Reserved for inherently not codable concepts without codable children: Secondary | ICD-10-CM | POA: Diagnosis not present

## 2014-09-03 DIAGNOSIS — M316 Other giant cell arteritis: Secondary | ICD-10-CM | POA: Diagnosis not present

## 2014-09-03 DIAGNOSIS — Z7901 Long term (current) use of anticoagulants: Secondary | ICD-10-CM | POA: Diagnosis not present

## 2014-09-03 DIAGNOSIS — IMO0001 Reserved for inherently not codable concepts without codable children: Secondary | ICD-10-CM | POA: Diagnosis not present

## 2014-09-03 DIAGNOSIS — I10 Essential (primary) hypertension: Secondary | ICD-10-CM | POA: Diagnosis not present

## 2014-09-03 DIAGNOSIS — Z86711 Personal history of pulmonary embolism: Secondary | ICD-10-CM | POA: Diagnosis not present

## 2014-09-03 DIAGNOSIS — Z794 Long term (current) use of insulin: Secondary | ICD-10-CM | POA: Diagnosis not present

## 2014-09-05 ENCOUNTER — Telehealth: Payer: Self-pay | Admitting: Internal Medicine

## 2014-09-05 ENCOUNTER — Other Ambulatory Visit: Payer: Self-pay | Admitting: *Deleted

## 2014-09-05 DIAGNOSIS — L97529 Non-pressure chronic ulcer of other part of left foot with unspecified severity: Principal | ICD-10-CM

## 2014-09-05 DIAGNOSIS — E08621 Diabetes mellitus due to underlying condition with foot ulcer: Secondary | ICD-10-CM

## 2014-09-05 MED ORDER — HYDROCODONE-ACETAMINOPHEN 5-325 MG PO TABS: 1.0000 | ORAL_TABLET | Freq: Four times a day (QID) | ORAL | Status: DC | PRN

## 2014-09-05 NOTE — Telephone Encounter (Signed)
Call pt when ready @ # [phone] Pt having back pain and ran out of Vicodin today

## 2014-09-05 NOTE — Telephone Encounter (Signed)
Pt.notified

## 2014-09-05 NOTE — Telephone Encounter (Signed)
Discussed with the patient and informed her that I will not prescribe opiates chronically. I encouraged her to call Dr Read Drivers number to call and make an appointment there. I will refill Vicodin 5 mg  (#120) for just one month and there will no be prescription for opiates from this clinic due to concern of falls in this patient.  She will make an appointment in one month with the pain clinic.

## 2014-09-06 DIAGNOSIS — Z86711 Personal history of pulmonary embolism: Secondary | ICD-10-CM | POA: Diagnosis not present

## 2014-09-06 DIAGNOSIS — I10 Essential (primary) hypertension: Secondary | ICD-10-CM | POA: Diagnosis not present

## 2014-09-06 DIAGNOSIS — Z7901 Long term (current) use of anticoagulants: Secondary | ICD-10-CM | POA: Diagnosis not present

## 2014-09-06 DIAGNOSIS — M316 Other giant cell arteritis: Secondary | ICD-10-CM | POA: Diagnosis not present

## 2014-09-06 DIAGNOSIS — Z794 Long term (current) use of insulin: Secondary | ICD-10-CM | POA: Diagnosis not present

## 2014-09-06 DIAGNOSIS — IMO0001 Reserved for inherently not codable concepts without codable children: Secondary | ICD-10-CM | POA: Diagnosis not present

## 2014-09-11 DIAGNOSIS — M316 Other giant cell arteritis: Secondary | ICD-10-CM | POA: Diagnosis not present

## 2014-09-11 DIAGNOSIS — Z794 Long term (current) use of insulin: Secondary | ICD-10-CM | POA: Diagnosis not present

## 2014-09-11 DIAGNOSIS — I10 Essential (primary) hypertension: Secondary | ICD-10-CM | POA: Diagnosis not present

## 2014-09-11 DIAGNOSIS — IMO0001 Reserved for inherently not codable concepts without codable children: Secondary | ICD-10-CM | POA: Diagnosis not present

## 2014-09-11 DIAGNOSIS — Z7901 Long term (current) use of anticoagulants: Secondary | ICD-10-CM | POA: Diagnosis not present

## 2014-09-11 DIAGNOSIS — Z86711 Personal history of pulmonary embolism: Secondary | ICD-10-CM | POA: Diagnosis not present

## 2014-09-14 DIAGNOSIS — M316 Other giant cell arteritis: Secondary | ICD-10-CM | POA: Diagnosis not present

## 2014-09-14 DIAGNOSIS — I10 Essential (primary) hypertension: Secondary | ICD-10-CM | POA: Diagnosis not present

## 2014-09-14 DIAGNOSIS — Z794 Long term (current) use of insulin: Secondary | ICD-10-CM | POA: Diagnosis not present

## 2014-09-14 DIAGNOSIS — IMO0001 Reserved for inherently not codable concepts without codable children: Secondary | ICD-10-CM | POA: Diagnosis not present

## 2014-09-14 DIAGNOSIS — Z86711 Personal history of pulmonary embolism: Secondary | ICD-10-CM | POA: Diagnosis not present

## 2014-09-14 DIAGNOSIS — Z7901 Long term (current) use of anticoagulants: Secondary | ICD-10-CM | POA: Diagnosis not present

## 2014-09-16 ENCOUNTER — Emergency Department (HOSPITAL_COMMUNITY): Payer: Medicare Other

## 2014-09-16 ENCOUNTER — Emergency Department (HOSPITAL_COMMUNITY)
Admission: EM | Admit: 2014-09-16 | Discharge: 2014-09-16 | Disposition: A | Discharge status: Home / Self Care | Payer: Medicare Other | Attending: Emergency Medicine | Admitting: Emergency Medicine

## 2014-09-16 ENCOUNTER — Encounter (HOSPITAL_COMMUNITY): Payer: Self-pay | Admitting: Emergency Medicine

## 2014-09-16 DIAGNOSIS — IMO0002 Reserved for concepts with insufficient information to code with codable children: Secondary | ICD-10-CM | POA: Insufficient documentation

## 2014-09-16 DIAGNOSIS — R000 Tachycardia, unspecified: Secondary | ICD-10-CM | POA: Insufficient documentation

## 2014-09-16 DIAGNOSIS — Z91040 Latex allergy status: Secondary | ICD-10-CM | POA: Insufficient documentation

## 2014-09-16 DIAGNOSIS — Z87442 Personal history of urinary calculi: Secondary | ICD-10-CM | POA: Diagnosis not present

## 2014-09-16 DIAGNOSIS — F3289 Other specified depressive episodes: Secondary | ICD-10-CM | POA: Insufficient documentation

## 2014-09-16 DIAGNOSIS — R05 Cough: Secondary | ICD-10-CM | POA: Diagnosis not present

## 2014-09-16 DIAGNOSIS — Z8739 Personal history of other diseases of the musculoskeletal system and connective tissue: Secondary | ICD-10-CM | POA: Insufficient documentation

## 2014-09-16 DIAGNOSIS — Z8701 Personal history of pneumonia (recurrent): Secondary | ICD-10-CM | POA: Insufficient documentation

## 2014-09-16 DIAGNOSIS — Z86711 Personal history of pulmonary embolism: Secondary | ICD-10-CM | POA: Diagnosis not present

## 2014-09-16 DIAGNOSIS — G43909 Migraine, unspecified, not intractable, without status migrainosus: Secondary | ICD-10-CM | POA: Insufficient documentation

## 2014-09-16 DIAGNOSIS — E7800 Pure hypercholesterolemia, unspecified: Secondary | ICD-10-CM | POA: Insufficient documentation

## 2014-09-16 DIAGNOSIS — R5381 Other malaise: Secondary | ICD-10-CM | POA: Diagnosis not present

## 2014-09-16 DIAGNOSIS — Z8669 Personal history of other diseases of the nervous system and sense organs: Secondary | ICD-10-CM | POA: Diagnosis not present

## 2014-09-16 DIAGNOSIS — Z87440 Personal history of urinary (tract) infections: Secondary | ICD-10-CM | POA: Diagnosis not present

## 2014-09-16 DIAGNOSIS — R0602 Shortness of breath: Secondary | ICD-10-CM | POA: Insufficient documentation

## 2014-09-16 DIAGNOSIS — R609 Edema, unspecified: Secondary | ICD-10-CM

## 2014-09-16 DIAGNOSIS — Z794 Long term (current) use of insulin: Secondary | ICD-10-CM | POA: Diagnosis not present

## 2014-09-16 DIAGNOSIS — E119 Type 2 diabetes mellitus without complications: Secondary | ICD-10-CM | POA: Insufficient documentation

## 2014-09-16 DIAGNOSIS — Z7901 Long term (current) use of anticoagulants: Secondary | ICD-10-CM | POA: Diagnosis not present

## 2014-09-16 DIAGNOSIS — G8929 Other chronic pain: Secondary | ICD-10-CM | POA: Diagnosis not present

## 2014-09-16 DIAGNOSIS — F17200 Nicotine dependence, unspecified, uncomplicated: Secondary | ICD-10-CM | POA: Insufficient documentation

## 2014-09-16 DIAGNOSIS — Z79899 Other long term (current) drug therapy: Secondary | ICD-10-CM | POA: Diagnosis not present

## 2014-09-16 DIAGNOSIS — F329 Major depressive disorder, single episode, unspecified: Secondary | ICD-10-CM | POA: Diagnosis not present

## 2014-09-16 DIAGNOSIS — R059 Cough, unspecified: Secondary | ICD-10-CM | POA: Insufficient documentation

## 2014-09-16 DIAGNOSIS — J9819 Other pulmonary collapse: Secondary | ICD-10-CM | POA: Diagnosis not present

## 2014-09-16 DIAGNOSIS — R5383 Other fatigue: Secondary | ICD-10-CM

## 2014-09-16 DIAGNOSIS — I10 Essential (primary) hypertension: Secondary | ICD-10-CM | POA: Insufficient documentation

## 2014-09-16 LAB — URINALYSIS, ROUTINE W REFLEX MICROSCOPIC
BILIRUBIN URINE: NEGATIVE
Glucose, UA: 500 mg/dL — AB
Ketones, ur: NEGATIVE mg/dL
Leukocytes, UA: NEGATIVE
Nitrite: NEGATIVE
Specific Gravity, Urine: 1.018 (ref 1.005–1.030)
Urobilinogen, UA: 0.2 mg/dL (ref 0.0–1.0)
pH: 5.5 (ref 5.0–8.0)

## 2014-09-16 LAB — URINE MICROSCOPIC-ADD ON

## 2014-09-16 LAB — PROTIME-INR
INR: 1.64 — ABNORMAL HIGH (ref 0.00–1.49)
Prothrombin Time: 19.4 seconds — ABNORMAL HIGH (ref 11.6–15.2)

## 2014-09-16 LAB — BASIC METABOLIC PANEL
Anion gap: 12 (ref 5–15)
BUN: 19 mg/dL (ref 6–23)
CO2: 27 meq/L (ref 19–32)
Calcium: 9.1 mg/dL (ref 8.4–10.5)
Chloride: 100 mEq/L (ref 96–112)
Creatinine, Ser: 1.24 mg/dL — ABNORMAL HIGH (ref 0.50–1.10)
GFR calc Af Amer: 52 mL/min — ABNORMAL LOW (ref >90)
GFR, EST NON AFRICAN AMERICAN: 45 mL/min — AB (ref >90)
Glucose, Bld: 288 mg/dL — ABNORMAL HIGH (ref 70–99)
Potassium: 4.2 mEq/L (ref 3.7–5.3)
Sodium: 139 mEq/L (ref 137–147)

## 2014-09-16 LAB — CBC
HEMATOCRIT: 35.7 % — AB (ref 36.0–46.0)
HEMOGLOBIN: 11.6 g/dL — AB (ref 12.0–15.0)
MCH: 28.4 pg (ref 26.0–34.0)
MCHC: 32.5 g/dL (ref 30.0–36.0)
MCV: 87.3 fL (ref 78.0–100.0)
Platelets: 389 10*3/uL (ref 150–400)
RBC: 4.09 MIL/uL (ref 3.87–5.11)
RDW: 16.6 % — ABNORMAL HIGH (ref 11.5–15.5)
WBC: 12.7 10*3/uL — ABNORMAL HIGH (ref 4.0–10.5)

## 2014-09-16 LAB — CBG MONITORING, ED: Glucose-Capillary: 262 mg/dL — ABNORMAL HIGH (ref 70–99)

## 2014-09-16 LAB — TROPONIN I: Troponin I: <0.3 ng/mL (ref <0.30)

## 2014-09-16 LAB — I-STAT CG4 LACTIC ACID, ED: Lactic Acid, Venous: 2.2 mmol/L (ref 0.5–2.2)

## 2014-09-16 LAB — PRO B NATRIURETIC PEPTIDE: PRO B NATRI PEPTIDE: 201 pg/mL — AB (ref 0–125)

## 2014-09-16 MED ORDER — MORPHINE SULFATE 4 MG/ML IJ SOLN
4.0000 mg | Freq: Once | INTRAMUSCULAR | Status: AC
  Administered 2014-09-16: 4 mg via INTRAVENOUS
  Filled 2014-09-16: qty 1

## 2014-09-16 MED ORDER — GABAPENTIN 300 MG PO CAPS: 300.0000 mg | ORAL_CAPSULE | Freq: Three times a day (TID) | ORAL | Status: DC

## 2014-09-16 MED ORDER — IOHEXOL 350 MG/ML SOLN
80.0000 mL | Freq: Once | INTRAVENOUS | Status: AC | PRN
  Administered 2014-09-16: 80 mL via INTRAVENOUS

## 2014-09-16 NOTE — ED Notes (Signed)
Pt given food and drink.

## 2014-09-16 NOTE — Discharge Instructions (Signed)
Take gabapentin daily as directed. Hold on your metformin for 48 hours. Followup with your primary care doctor.  Peripheral Edema You have swelling in your legs (peripheral edema). This swelling is due to excess accumulation of salt and water in your body. Edema may be a sign of heart, kidney or liver disease, or a side effect of a medication. It may also be due to problems in the leg veins. Elevating your legs and using special support stockings may be very helpful, if the cause of the swelling is due to poor venous circulation. Avoid long periods of standing, whatever the cause. Treatment of edema depends on identifying the cause. Chips, pretzels, pickles and other salty foods should be avoided. Restricting salt in your diet is almost always needed. Water pills (diuretics) are often used to remove the excess salt and water from your body via urine. These medicines prevent the kidney from reabsorbing sodium. This increases urine flow. Diuretic treatment may also result in lowering of potassium levels in your body. Potassium supplements may be needed if you have to use diuretics daily. Daily weights can help you keep track of your progress in clearing your edema. You should call your caregiver for follow up care as recommended. SEEK IMMEDIATE MEDICAL CARE IF:   You have increased swelling, pain, redness, or heat in your legs.  You develop shortness of breath, especially when lying down.  You develop chest or abdominal pain, weakness, or fainting.  You have a fever. Document Released: 01/14/2005 Document Revised: 02/29/2012 Document Reviewed: 12/25/2009 [facility] Patient Information 2015 [place], [place]. This information is not intended to replace advice given to you by your health care provider. Make sure you discuss any questions you have with your health care provider.  Shortness of Breath Shortness of breath means you have trouble breathing. It could also mean that you have a medical problem.  You should get immediate medical care for shortness of breath. CAUSES   Not enough oxygen in the air such as with high altitudes or a smoke-filled room.  Certain lung diseases, infections, or problems.  Heart disease or conditions, such as angina or heart failure.  Low red blood cells (anemia).  Poor physical fitness, which can cause shortness of breath when you exercise.  Chest or back injuries or stiffness.  Being overweight.  Smoking.  Anxiety, which can make you feel like you are not getting enough air. DIAGNOSIS  Serious medical problems can often be found during your physical exam. Tests may also be done to determine why you are having shortness of breath. Tests may include:  Chest X-rays.  Lung function tests.  Blood tests.  An electrocardiogram (ECG).  An ambulatory electrocardiogram. An ambulatory ECG records your heartbeat patterns over a 24-hour period.  Exercise testing.  A transthoracic echocardiogram (TTE). During echocardiography, sound waves are used to evaluate how blood flows through your heart.  A transesophageal echocardiogram (TEE).  Imaging scans. Your health care provider may not be able to find a cause for your shortness of breath after your exam. In this case, it is important to have a follow-up exam with your health care provider as directed.  TREATMENT  Treatment for shortness of breath depends on the cause of your symptoms and can vary greatly. HOME CARE INSTRUCTIONS   Do not smoke. Smoking is a common cause of shortness of breath. If you smoke, ask for help to quit.  Avoid being around chemicals or things that may bother your breathing, such as paint fumes and  dust.  Rest as needed. Slowly resume your usual activities.  If medicines were prescribed, take them as directed for the full length of time directed. This includes oxygen and any inhaled medicines.  Keep all follow-up appointments as directed by your health care provider. SEEK  MEDICAL CARE IF:   Your condition does not improve in the time expected.  You have a hard time doing your normal activities even with rest.  You have any new symptoms. SEEK IMMEDIATE MEDICAL CARE IF:   Your shortness of breath gets worse.  You feel light-headed, faint, or develop a cough not controlled with medicines.  You start coughing up blood.  You have pain with breathing.  You have chest pain or pain in your arms, shoulders, or abdomen.  You have a fever.  You are unable to walk up stairs or exercise the way you normally do. MAKE SURE YOU:  Understand these instructions.  Will watch your condition.  Will get help right away if you are not doing well or get worse. Document Released: 09/01/2001 Document Revised: 12/12/2013 Document Reviewed: 02/22/2012 [facility] Patient Information 2015 [place], [place]. This information is not intended to replace advice given to you by your health care provider. Make sure you discuss any questions you have with your health care provider.   Metformin and X-ray Contrast Studies For some X-ray exams, a contrast dye is used. Contrast dye is a type of medicine used to make the X-ray image clearer. The contrast dye is given to the patient through a vein (intravenously). If you need to have this type of X-ray exam and you take a medication called metformin, your caregiver may have you stop taking metformin before the exam.  LACTIC ACIDOSIS In rare cases, a serious medical condition called lactic acidosis can develop in people who take metformin and receive contrast dye. The following conditions can increase the risk of this complication:   Kidney failure.  Liver problems.  Certain types of heart problems such as:  Heart failure.  Heart attack.  Heart infection.  Heart valve problems.  Alcohol abuse. If left untreated, lactic acidosis can lead to coma.  SYMPTOMS OF LACTIC ACIDOSIS Symptoms of lactic acidosis can include:  Rapid  breathing (hyperventilation).  Neurologic symptoms such as:  Headaches.  Confusion.  Dizziness.  Excessive sweating.  Feeling sick to your stomach (nauseous) or throwing up (vomiting). AFTER THE X-RAY EXAM  Stay well-hydrated. Drink fluids as instructed by your caregiver.  If you have a risk of developing lactic acidosis, blood tests may be done to make sure your kidney function is okay.  Metformin is usually stopped for 48 hours after the X-ray exam. Ask your caregiver when you can start taking metformin again. SEEK MEDICAL CARE IF:   You have shortness of breath or difficulty breathing.  You develop a headache that does not go away.  You have nausea or vomiting.  You urinate more than normal.  You develop a skin rash and have:  Redness.  Swelling.  Itching. Document Released: 11/25/2009 Document Revised: 02/29/2012 Document Reviewed: 11/25/2009 [facility] Patient Information 2015 [place], [place]. This information is not intended to replace advice given to you by your health care provider. Make sure you discuss any questions you have with your health care provider.

## 2014-09-16 NOTE — ED Notes (Signed)
Assisted patient in sitting up and getting out of bed. Attempted to ambulate patient but after approx. three steps she stated her legs hurt and was unable to continue. Assisted patient back into bed and secured. Patient O2 level during ambulation attempt was 99% , HR 104.

## 2014-09-16 NOTE — ED Provider Notes (Signed)
CSN: [id number]     Arrival date & time 09/16/14  0946 History   First MD Initiated Contact with Patient 09/16/14 [phone]     Chief Complaint  Patient presents with  . Shortness of Breath  . Extremity Weakness     (Consider location/radiation/quality/duration/timing/severity/associated sxs/prior Treatment) HPI Comments: Patient is a 63 year old obese female with a past medical history of hypertension, pulmonary embolism (on coumadin), hypercholesterolemia, depression, diabetic neuropathy, type 2 diabetes, migraines and chronic back pain who presents to the emergency department complaining of increased shortness of breath times one week. Shortness of breath worse on exertion, relieved by rest. States her legs have been swelling "for a long time", she's had DVT's ruled out 2 months ago. States the swelling is increasing over the past 3 days with associated pain and weakness up to her knees bilaterally. Denies back pain. She is also being treated for a chronic ulcer on the bottom of her left foot by a podiatrist. She tried calling her PCP but could not get an appointment until October 7. Denies chest pain, fever, chills, urinary symptoms. States she "always coughs". She endorses dry mouth. States her sugars have been in the 200s.  Patient is a 63 y.o. female presenting with shortness of breath and extremity weakness. The history is provided by the patient.  Shortness of Breath Associated symptoms: cough   Extremity Weakness Associated symptoms include arthralgias, coughing, myalgias and weakness (bilateral legs).    Past Medical History  Diagnosis Date  . Hypertension   . Pulmonary embolism     2011 treated at [facility] [place].  Was on Coumadin for over a year.  no known family history  . UTI (urinary tract infection)   . High cholesterol   . 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 Depression   . Neuropathy     feet  . Kidney stones   . Pneumonia     "several times" (07/26/2014)   . Type II diabetes mellitus dx'd 2000  . Daily headache   . Migraine     "last one was in the 1990's" (07/26/2014)  . Arthritis     "knees" (07/26/2014)  . Chronic back pain    Past Surgical History  Procedure Laterality Date  . Cholecystectomy      1980  . Esophagogastroduodenoscopy  08/19/2012    Procedure: ESOPHAGOGASTRODUODENOSCOPY (EGD);  Surgeon: Winfield Cunas., MD;  Location: [facility] ENDOSCOPY;  Service: Endoscopy;  Laterality: N/A;  . Cesarean section  1982  . Carpal tunnel release Bilateral   . Tonsillectomy    . Artery biopsy Right 05/09/2014    Procedure: BIOPSY TEMPORAL ARTERY;  Surgeon: Rosetta Posner, MD;  Location: [facility];  Service: Vascular;  Laterality: Right;  . Appendectomy    . Vaginal hysterectomy    . Dilation and curettage of uterus  1982  . Cystoscopy w/ stone manipulation    . Breast biopsy Left   . Temporal artery biopsy / ligation Right 04/2014  . Eye surgery Bilateral   . Refractive surgery Bilateral    Family History  Problem Relation Age of Onset  . Adopted: Yes  . Hypertension Mother    History  Substance Use Topics  . Smoking status: Current Every Day Smoker -- 1.00 packs/day for 44 years    Types: Cigarettes  . Smokeless tobacco: Never Used  . Alcohol Use: No   OB History   Grav Para Term Preterm Abortions TAB SAB Ect Mult  Living                 Review of Systems  Respiratory: Positive for cough and shortness of breath.   Cardiovascular: Positive for leg swelling.  Musculoskeletal: Positive for arthralgias, extremity weakness and myalgias.  Neurological: Positive for weakness (bilateral legs).  All other systems reviewed and are negative.     Allergies  Keflex; Clindamycin/lincomycin; Latex; Sulfa antibiotics; and Sulfur  Home Medications   Prior to Admission medications   Medication Sig Start Date End Date Taking? Authorizing Provider  amitriptyline (ELAVIL) 25 MG tablet Take 50 mg by mouth at bedtime.    Yes Historical Provider,  MD  amLODipine (NORVASC) 10 MG tablet Take 1 tablet (10 mg total) by mouth daily.   Yes Jessee Avers, MD  citalopram (CELEXA) 20 MG tablet Take 20 mg by mouth daily.   Yes Historical Provider, MD  gabapentin (NEURONTIN) 300 MG capsule Take 300 mg by mouth 3 (three) times daily.   Yes Historical Provider, MD  insulin NPH-regular Human (NOVOLIN 70/30) (70-30) 100 UNIT/ML injection Inject 45 Units into the skin 2 (two) times daily with a meal.  06/14/14  Yes Malena Catholic, MD  losartan-hydrochlorothiazide (HYZAAR) 100-25 MG per tablet Take 1 tablet by mouth daily.   Yes Historical Provider, MD  metFORMIN (GLUCOPHAGE) 500 MG tablet Take 2 tablets (1,000 mg total) by mouth 2 (two) times daily with a meal. 08/02/14  Yes Marjan Rabbani, MD  Multiple Vitamin (MULTIVITAMIN WITH MINERALS) TABS tablet Take 1 tablet by mouth daily.   Yes Historical Provider, MD  omeprazole (PRILOSEC) 20 MG capsule Take 1 capsule (20 mg total) by mouth daily. 05/23/14  Yes Jessee Avers, MD  pravastatin (PRAVACHOL) 40 MG tablet Take 1 tablet (40 mg total) by mouth every evening. 08/02/14 08/02/15 Yes Marjan Rabbani, MD  predniSONE (DELTASONE) 10 MG tablet Take 1-20 mg by mouth daily with breakfast. Tapered course: take 2 tablets (20 mg) daily until 07/25/14, take 1 1/2 tablets (15 mg) daily 07/26/14 thru 08/09/14, take 1 tablet (10 mg) daily 08/10/14 thru 02/17/15, then decrease by 1 mg every month until stopped   Yes Historical Provider, MD  traMADol (ULTRAM) 50 MG tablet Take 50 mg by mouth every 8 (eight) hours as needed (pain).   Yes Historical Provider, MD  warfarin (COUMADIN) 5 MG tablet Take 10-12.5 mg by mouth every evening. Take 2 1/2 tablets (12.5 mg) on Monday and Thursday, take 2 tablets (10 mg) on all other days of the week   Yes Historical Provider, MD  gabapentin (NEURONTIN) 300 MG capsule Take 1 capsule (300 mg total) by mouth 3 (three) times daily. 09/16/14   Illene Labrador, PA-C   BP 149/78  Pulse 109   Temp(Src) 98.3 F (36.8 C) (Oral)  Resp 16  Ht 5\' 9"  (1.753 m)  Wt 276 lb (125.193 kg)  BMI 40.74 kg/m2  SpO2 93% Physical Exam  Nursing note and vitals reviewed. Constitutional: She is oriented to person, place, and time. She appears well-developed and well-nourished. No distress.  HENT:  Head: Normocephalic and atraumatic.  Dry MM.  Eyes: Conjunctivae and EOM are normal. Pupils are equal, round, and reactive to light.  Neck: Normal range of motion. Neck supple. No JVD present.  Cardiovascular: Regular rhythm, normal heart sounds and intact distal pulses.   Tachycardic. +1 pitting edema LE bilateral, LLE slightly larger than RLE.  Pulmonary/Chest: Effort normal and breath sounds normal. No respiratory distress.  Abdominal: Soft. Bowel sounds are  normal. There is no tenderness.  Musculoskeletal: Normal range of motion. She exhibits no edema.  No calf tenderness bilateral.  Neurological: She is alert and oriented to person, place, and time. She has normal strength. No sensory deficit.  Speech fluent, goal oriented. Moves limbs without ataxia. Equal grip strength bilateral.  Skin: Skin is warm and dry. She is not diaphoretic.  Stage 2 diabetic ulcer on plantar aspect of L foot below 1st MTP.  Psychiatric: She has a normal mood and affect. Her behavior is normal.    ED Course  Procedures (including critical care time) Labs Review Labs Reviewed  CBC - Abnormal; Notable for the following:    WBC 12.7 (*)    Hemoglobin 11.6 (*)    HCT 35.7 (*)    RDW 16.6 (*)    All other components within normal limits  BASIC METABOLIC PANEL - Abnormal; Notable for the following:    Glucose, Bld 288 (*)    Creatinine, Ser 1.24 (*)    GFR calc non Af Amer 45 (*)    GFR calc Af Amer 52 (*)    All other components within normal limits  PRO B NATRIURETIC PEPTIDE - Abnormal; Notable for the following:    Pro B Natriuretic peptide (BNP) 201.0 (*)    All other components within normal limits   URINALYSIS, ROUTINE W REFLEX MICROSCOPIC - Abnormal; Notable for the following:    Glucose, UA 500 (*)    Hgb urine dipstick MODERATE (*)    Protein, ur >300 (*)    All other components within normal limits  PROTIME-INR - Abnormal; Notable for the following:    Prothrombin Time 19.4 (*)    INR 1.64 (*)    All other components within normal limits  URINE MICROSCOPIC-ADD ON - Abnormal; Notable for the following:    Casts HYALINE CASTS (*)    All other components within normal limits  CBG MONITORING, ED - Abnormal; Notable for the following:    Glucose-Capillary 262 (*)    All other components within normal limits  TROPONIN I  I-STAT CG4 LACTIC ACID, ED    Imaging Review Dg Chest 2 View  09/16/2014   CLINICAL DATA:  Shortness of breath and extremity weakness since Wednesday, history hypertension, diabetes, smoking  EXAM: CHEST  2 VIEW  COMPARISON:  05/25/2014  FINDINGS: Enlargement of cardiac silhouette.  Mediastinal contours and pulmonary vascularity normal.  Subsegmental atelectasis in the mid lungs bilaterally.  No gross infiltrate, pleural effusion or pneumothorax.  Bones unremarkable.  IMPRESSION: Enlargement of cardiac silhouette.  Subsegmental atelectasis in the mid lungs bilaterally.   Electronically Signed   By: Lavonia Dana M.D.   On: 09/16/2014 12:41   Ct Angio Chest W/cm &/or Wo Cm  09/16/2014   CLINICAL DATA:  Shortness of breath for 1 week. Difficulty walking very bilateral increase weakness in the legs for 3 days. History of hypertension, diabetes, cholecystectomy, hysterectomy.  EXAM: CT ANGIOGRAPHY CHEST WITH CONTRAST  TECHNIQUE: Multidetector CT imaging of the chest was performed using the standard protocol during bolus administration of intravenous contrast. Multiplanar CT image reconstructions and MIPs were obtained to evaluate the vascular anatomy.  CONTRAST:  69mL OMNIPAQUE IOHEXOL 350 MG/ML SOLN  COMPARISON:  CT angio chest 10/12/2012  FINDINGS: Heart: Heart size is  normal. Coronary artery calcifications are noted. No pericardial effusion.  Vascular structures: The pulmonary arteries are well opacified. There is no evidence for acute pulmonary embolus. The aorta is well opacified with contrast. Note is  made of atherosclerotic calcification of portions of the aorta. No aneurysm. Incidental note is made of bovine arch anatomy.  Mediastinum/thyroid: There small common nonspecific mediastinal and hilar lymph nodes the largest of these is seen in the right peritracheal region and measures 1.6 cm.  Lungs/Airways: Major airways are patent. There is a mosaic appearance of the lung parenchyma, consistent with mild airspace disease and regions of air trapping. The appearance is similar to the prior study.  Upper abdomen: Unremarkable.  Chest wall/osseous structures: Degenerative changes are seen in the spine. No suspicious lytic or blastic lesions are identified.  Review of the MIP images confirms the above findings.  IMPRESSION: 1. Technically adequate exam showing no pulmonary embolus. 2. Coronary artery calcifications. 3. Nonspecific mediastinal and hilar lymph nodes. 4. Mosaic lung parenchymal appearance consistent with mild airspace filling and air trapping. Consider pulmonary consultation.   Electronically Signed   By: Shon Hale M.D.   On: 09/16/2014 14:25     EKG Interpretation   Date/Time:  Sunday September 16 2014 09:59:42 EDT Ventricular Rate:  109 PR Interval:  155 QRS Duration: 84 QT Interval:  368 QTC Calculation: 496 R Axis:   69 Text Interpretation:  Sinus tachycardia Low voltage, precordial leads  Borderline prolonged QT interval No significant change since last tracing  Confirmed by HORTON  MD, COURTNEY ([id number]) on 09/16/2014 10:07:54 AM      MDM   Final diagnoses:  Shortness of breath  Peripheral edema   Patient presenting with shortness of breath and bilateral leg pain. She is nontoxic appearing and in no apparent distress. Afebrile,  tachycardic on arrival, vitals otherwise stable. No associated chest pain. Recently ruled out DVTs as stated above. Leg swelling has been a chronic problem. It is noted she has proteinuria which is evident of her leg swelling. Leukocytosis of 12.7. Kidney functions at baseline. INR noted to be subtherapeutic at 1.64, given shortness of breath and tachycardia, CT angiogram chest obtained, no PE seen, study unchanged from prior. Troponin negative. BNP 201. After receiving pain medication, patient was able to ambulate with O2 sat remaining at 99% on room air. I spoke with Mariane Masters, case management who will speak with patient, advised home health RN, PT and social work. Will discharge back on gabapentin which she is no longer taking.  Case discussed with attending Dr. Dina Rich who also evaluated patient and agrees with plan of care.   Illene Labrador, PA-C 09/16/14 1550

## 2014-09-16 NOTE — ED Notes (Signed)
Pt from home with c/o shortness of breath x 1 week with no hx of the same and difficulty walking with increased weakness bilateral legs x 3 days.  Pt is a current everyday smoker.  Pt in NAD, A&O.

## 2014-09-17 NOTE — Care Management ED Note (Signed)
      CARE MANAGEMENT ED NOTE 09/16/2014  Patient:  Joy Butler, Joy Butler   Account Number:  [id number]  Date Initiated:  09/16/2014  Documentation initiated by:  [facility]  Subjective/Objective Assessment:   c/o shortness of breath x 1 week     Subjective/Objective Assessment Detail:     Action/Plan:   discharge planning   Action/Plan Detail:   MD called to request Cm arrange HHPT/RN/SW to evaluate and treat/eval/resources respectively.   Anticipated DC Date:  09/16/2014     Status Recommendation to Physician:   Result of Recommendation:      DC Planning Services  CM consult    Choice offered to / List presented to:  C-1 Patient     [place] arranged  HH-1 RN  [place]      [place].    Status of service:    ED Comments:   ED Comments Detail:  09/16/14 16:00 CM met with pt to offer choice and pt states she is seen by [facility] for foot wound.  Address and contact information verified with pt.  NO DME needed.  Referral called to [facility] rep, [facility].  [place] medical supply number on AVS for pt.  No other CM need communicated.  Mariane Masters, BSN, CM [phone].

## 2014-09-18 DIAGNOSIS — Z7901 Long term (current) use of anticoagulants: Secondary | ICD-10-CM | POA: Diagnosis not present

## 2014-09-18 DIAGNOSIS — I10 Essential (primary) hypertension: Secondary | ICD-10-CM | POA: Diagnosis not present

## 2014-09-18 DIAGNOSIS — Z794 Long term (current) use of insulin: Secondary | ICD-10-CM | POA: Diagnosis not present

## 2014-09-18 DIAGNOSIS — Z86711 Personal history of pulmonary embolism: Secondary | ICD-10-CM | POA: Diagnosis not present

## 2014-09-18 DIAGNOSIS — IMO0001 Reserved for inherently not codable concepts without codable children: Secondary | ICD-10-CM | POA: Diagnosis not present

## 2014-09-18 DIAGNOSIS — M316 Other giant cell arteritis: Secondary | ICD-10-CM | POA: Diagnosis not present

## 2014-09-18 NOTE — ED Provider Notes (Signed)
Medical screening examination/treatment/procedure(s) were conducted as a shared visit with non-physician practitioner(s) and myself.  I personally evaluated the patient during the encounter.   EKG Interpretation   Date/Time:  Sunday September 16 2014 09:59:42 EDT Ventricular Rate:  109 PR Interval:  155 QRS Duration: 84 QT Interval:  368 QTC Calculation: 496 R Axis:   69 Text Interpretation:  Sinus tachycardia Low voltage, precordial leads  Borderline prolonged QT interval No significant change since last tracing  Confirmed by Desmond Tufano  MD, Arnol Mcgibbon ([id number]) on 09/16/2014 10:07:54 AM      Patient presents with shortness of breath and bilateral leg pain and swelling. Reports this is chronic in nature but acutely worsened. Is morbidly obese but nontoxic. Basic labwork obtained. D-dimer also obtained. Lab are notable for proteinuria, and subtherapeutic INR. Kidney function is at baseline. Patient is very short of breath, tachycardia, and is positive d-dimer. Will obtain CT scan. No evidence of PE. Patient was able to angulate and maintain her O2 sats. On my exam, she is mostly complaining of bilateral leg pain and difficulty achieving her ADLs secondary to pain.   Case management has evaluated the patient and orders placed for home RN, PT, and social work. Suspect patient's leg pain is a combination of her obesity, leg swelling, and neuropathy. Will place back on gabapentin. She is to followup with her primary Dr.  After history, exam, and medical workup I feel the patient has been appropriately medically screened and is safe for discharge home. Pertinent diagnoses were discussed with the patient. Patient was given return precautions.   [name], MD 09/18/14 1005

## 2014-09-19 ENCOUNTER — Telehealth: Payer: Self-pay | Admitting: *Deleted

## 2014-09-19 DIAGNOSIS — M315 Giant cell arteritis with polymyalgia rheumatica: Secondary | ICD-10-CM | POA: Diagnosis not present

## 2014-09-19 DIAGNOSIS — L97521 Non-pressure chronic ulcer of other part of left foot limited to breakdown of skin: Secondary | ICD-10-CM | POA: Diagnosis not present

## 2014-09-19 DIAGNOSIS — I10 Essential (primary) hypertension: Secondary | ICD-10-CM | POA: Diagnosis not present

## 2014-09-19 DIAGNOSIS — Z86711 Personal history of pulmonary embolism: Secondary | ICD-10-CM | POA: Diagnosis not present

## 2014-09-19 DIAGNOSIS — Z7901 Long term (current) use of anticoagulants: Secondary | ICD-10-CM | POA: Diagnosis not present

## 2014-09-19 DIAGNOSIS — E1140 Type 2 diabetes mellitus with diabetic neuropathy, unspecified: Secondary | ICD-10-CM | POA: Diagnosis not present

## 2014-09-19 DIAGNOSIS — Z794 Long term (current) use of insulin: Secondary | ICD-10-CM | POA: Diagnosis not present

## 2014-09-19 DIAGNOSIS — Z720 Tobacco use: Secondary | ICD-10-CM | POA: Diagnosis not present

## 2014-09-19 DIAGNOSIS — E11621 Type 2 diabetes mellitus with foot ulcer: Secondary | ICD-10-CM | POA: Diagnosis not present

## 2014-09-19 NOTE — Telephone Encounter (Signed)
[facility] calls to ask for verbal approval for recert for nursing care and also add social work, in home, for lack of transportation and any other possible assistance to improve pt's care and PT in home due to pt not being mobile outside home and pictures to be taken and sent to [facility] wound care nurse, the approval was given, do you agree?

## 2014-09-19 NOTE — Telephone Encounter (Signed)
Yes I agree. Thanks!

## 2014-09-26 ENCOUNTER — Telehealth: Payer: Self-pay | Admitting: *Deleted

## 2014-09-26 ENCOUNTER — Encounter: Payer: Medicare Other | Admitting: Internal Medicine

## 2014-09-26 NOTE — Telephone Encounter (Signed)
Call from [place], [place]  at [facility] Dept [phone]  Pt has come up on list with community health response services needing help with pill box and med management. Fax med list to [phone]  Will you give the verbal order for this?

## 2014-09-27 NOTE — Telephone Encounter (Signed)
I called Sharyn Lull at [facility] and she states she just learned that [facility] is seeing pt for wound care.  She is calling [facility] and see if they can fill the pill box as this would be covered under her insurance.   Sharyn Lull will take over after [facility] stops seeing pt.

## 2014-09-28 ENCOUNTER — Telehealth: Payer: Self-pay | Admitting: *Deleted

## 2014-09-28 NOTE — Telephone Encounter (Signed)
[facility] calls and states [facility] care nurse at [facility] suggests that vaseline to the wound bed to soften area for preparation of debridement and iodoflex and cover w/ a small drsg to offset the pressure and gauze 2 times weekly, until the iodoflex arrives they are using santyl. Pt is scheduled w/ wound clinic. Do you approve? If you have questions or concerns please call danielle at [phone]

## 2014-09-28 NOTE — Telephone Encounter (Signed)
I am okay with that.  Thanks! 

## 2014-10-01 ENCOUNTER — Encounter: Payer: Self-pay | Admitting: Internal Medicine

## 2014-10-01 ENCOUNTER — Ambulatory Visit (INDEPENDENT_AMBULATORY_CARE_PROVIDER_SITE_OTHER): Payer: Medicare Other | Admitting: Pharmacist

## 2014-10-01 ENCOUNTER — Ambulatory Visit (INDEPENDENT_AMBULATORY_CARE_PROVIDER_SITE_OTHER): Payer: Medicare Other | Admitting: Internal Medicine

## 2014-10-01 VITALS — BP 147/77 | HR 102 | Temp 98.3°F | Ht 69.0 in | Wt 274.6 lb

## 2014-10-01 DIAGNOSIS — E118 Type 2 diabetes mellitus with unspecified complications: Secondary | ICD-10-CM | POA: Diagnosis not present

## 2014-10-01 DIAGNOSIS — F329 Major depressive disorder, single episode, unspecified: Secondary | ICD-10-CM

## 2014-10-01 DIAGNOSIS — E08621 Diabetes mellitus due to underlying condition with foot ulcer: Secondary | ICD-10-CM | POA: Diagnosis not present

## 2014-10-01 DIAGNOSIS — I2699 Other pulmonary embolism without acute cor pulmonale: Secondary | ICD-10-CM

## 2014-10-01 DIAGNOSIS — G8929 Other chronic pain: Secondary | ICD-10-CM | POA: Diagnosis not present

## 2014-10-01 DIAGNOSIS — M549 Dorsalgia, unspecified: Secondary | ICD-10-CM | POA: Diagnosis not present

## 2014-10-01 DIAGNOSIS — M316 Other giant cell arteritis: Secondary | ICD-10-CM

## 2014-10-01 DIAGNOSIS — Z7901 Long term (current) use of anticoagulants: Secondary | ICD-10-CM

## 2014-10-01 DIAGNOSIS — R269 Unspecified abnormalities of gait and mobility: Secondary | ICD-10-CM

## 2014-10-01 DIAGNOSIS — I10 Essential (primary) hypertension: Secondary | ICD-10-CM | POA: Diagnosis not present

## 2014-10-01 DIAGNOSIS — W19XXXD Unspecified fall, subsequent encounter: Secondary | ICD-10-CM

## 2014-10-01 DIAGNOSIS — F17210 Nicotine dependence, cigarettes, uncomplicated: Secondary | ICD-10-CM

## 2014-10-01 DIAGNOSIS — F32A Depression, unspecified: Secondary | ICD-10-CM

## 2014-10-01 DIAGNOSIS — Z23 Encounter for immunization: Secondary | ICD-10-CM | POA: Diagnosis not present

## 2014-10-01 DIAGNOSIS — L97529 Non-pressure chronic ulcer of other part of left foot with unspecified severity: Secondary | ICD-10-CM

## 2014-10-01 DIAGNOSIS — M25561 Pain in right knee: Secondary | ICD-10-CM

## 2014-10-01 LAB — POCT INR
INR: 1.1
INR: 1.1

## 2014-10-01 MED ORDER — TRAZODONE 25 MG HALF TABLET: 25.0000 mg | ORAL_TABLET | Freq: Every day | ORAL | Status: DC

## 2014-10-01 MED ORDER — INSULIN NPH ISOPHANE & REGULAR (70-30) 100 UNIT/ML ~~LOC~~ SUSP: 48.0000 [IU] | Freq: Two times a day (BID) | SUBCUTANEOUS | Status: DC

## 2014-10-01 MED ORDER — NICOTINE 14 MG/24HR TD PT24: 14.0000 mg | MEDICATED_PATCH | TRANSDERMAL | Status: DC

## 2014-10-01 NOTE — Assessment & Plan Note (Signed)
She is compliant with the Prednisone at the current dose of 10 mg daily. Discussed the risks and benefits of continuing with this medication, especially in the setting of recent weight gain, and diabetic control. Encouraged the patient continue with this medication until February 2016 when it will be tapered off. Patient is agreeable to this plan.

## 2014-10-01 NOTE — Assessment & Plan Note (Signed)
Patient wishes to try a nicotine replacement therapy with patches.

## 2014-10-01 NOTE — Assessment & Plan Note (Signed)
Followup with outpatient PT for assessment of physical function, and equipment needs.

## 2014-10-01 NOTE — Assessment & Plan Note (Signed)
Lab Results  Component Value Date   HGBA1C 13.4 07/26/2014   HGBA1C 13.3 05/16/2014   HGBA1C 13.7 02/07/2014     Assessment: Diabetes control: poor control (HgbA1C >9%) Progress toward A1C goal:  unchanged Comments: Therapy complicated by weight gain in the setting of steroid use. She did not bring her glucose meter today.  Plan: Medications: Increase insulin 70/30 from 45 units twice a day 48 units twice a day. Continue with metformin 1000 mg twice a day Home glucose monitoring: Frequency: 4 times a day Timing: before breakfast Instruction/counseling given: reminded to bring blood glucose meter & log to each visit and reminded to bring medications to each visit Educational resources provided:   Self management tools provided:   Other plans: Followup in 2 weeks

## 2014-10-01 NOTE — Assessment & Plan Note (Signed)
She reports compliance with coumadin. Plan  Check INR today.

## 2014-10-01 NOTE — Patient Instructions (Addendum)
General Instructions: Please start taking Trazodone 25 mg at bedtime Stop taking amitriptyline Please take Celexa 10 mg (1/2 pill of your current bottle) once daily and for two weeks and then stop  Please increased Insulin 70/30 from 45 units twice daily to 48 units twice daily  I have referred you for equipment assessment  I have also referred to you to orthopedics  Please come back in 2-3 weeks to see me  Please bring your medicines with you each time you come to clinic.  Medicines may include prescription medications, over-the-counter medications, herbal remedies, eye drops, vitamins, or other pills. Please bring your medicines with you each time you come.   Medicines may be  Eye drops  Herbal   Vitamins  Pills  Seeing these help [place] take care of you.   Progress Toward Treatment Goals:  Treatment Goal 10/01/2014  Hemoglobin A1C unchanged  Blood pressure at goal  Stop smoking smoking the same amount  Prevent falls improved    Self Care Goals & Plans:  Self Care Goal 10/01/2014  Manage my medications take my medicines as prescribed; bring my medications to every visit; refill my medications on time  Monitor my health -  Eat healthy foods eat foods that are low in salt; eat baked foods instead of fried foods; eat fruit for snacks and desserts  Be physically active (No Data)  Stop smoking (No Data)  Prevent falls -  Meeting treatment goals -    Home Blood Glucose Monitoring 10/01/2014  Check my blood sugar 4 times a day  When to check my blood sugar before breakfast     Care Management & Community Referrals:  Referral 10/01/2014  Referrals made for care management support none needed  Referrals made to community resources -       Trazodone tablets What is this medicine? TRAZODONE (TRAZ oh done) is used to treat depression. This medicine may be used for other purposes; ask your health care provider or pharmacist if you have questions. COMMON BRAND  NAME(S): Desyrel What should I tell my health care provider before I take this medicine? They need to know if you have any of these conditions: -attempted suicide or thinking about it -bipolar disorder -bleeding problems -glaucoma -heart disease, or previous heart attack -irregular heart beat -kidney or liver disease -low levels of sodium in the blood -an unusual or allergic reaction to trazodone, other medicines, foods, dyes or preservatives -pregnant or trying to get pregnant -breast-feeding How should I use this medicine? Take this medicine by mouth with a glass of water. Follow the directions on the prescription label. Take this medicine shortly after a meal or a light snack. Take your medicine at regular intervals. Do not take your medicine more often than directed. Do not stop taking this medicine suddenly except upon the advice of your doctor. Stopping this medicine too quickly may cause serious side effects or your condition may worsen. A special MedGuide will be given to you by the pharmacist with each prescription and refill. Be sure to read this information carefully each time. Talk to your pediatrician regarding the use of this medicine in children. Special care may be needed. Overdosage: If you think you have taken too much of this medicine contact a poison control center or emergency room at once. NOTE: This medicine is only for you. Do not share this medicine with others. What if I miss a dose? If you miss a dose, take it as soon as you can. If it  is almost time for your next dose, take only that dose. Do not take double or extra doses. What may interact with this medicine? Do not take this medicine with any of the following medications: -certain medicines for fungal infections like fluconazole, itraconazole, ketoconazole, posaconazole, voriconazole -cisapride -dofetilide -dronedarone -linezolid -MAOIs like Carbex, Eldepryl, Marplan, Nardil, and  Parnate -mesoridazine -methylene blue (injected into a vein) -pimozide -saquinavir -thioridazine -ziprasidone This medicine may also interact with the following medications: -alcohol -antiviral medicines for HIV or AIDS -aspirin and aspirin-like medicines -barbiturates like phenobarbital -certain medicines for blood pressure, heart disease, irregular heart beat -certain medicines for depression, anxiety, or psychotic disturbances -certain medicines for migraine headache like almotriptan, eletriptan, frovatriptan, naratriptan, rizatriptan, sumatriptan, zolmitriptan -certain medicines for seizures like carbamazepine and phenytoin -certain medicines for sleep -certain medicines that treat or prevent blood clots like dalteparin, enoxaparin, warfarin -digoxin -fentanyl -lithium -NSAIDS, medicines for pain and inflammation, like ibuprofen or naproxen -other medicines that prolong the QT interval (cause an abnormal heart rhythm) -rasagiline -supplements like St. John's wort, kava kava, valerian -tramadol -tryptophan This list may not describe all possible interactions. Give your health care provider a list of all the medicines, herbs, non-prescription drugs, or dietary supplements you use. Also tell them if you smoke, drink alcohol, or use illegal drugs. Some items may interact with your medicine. What should I watch for while using this medicine? Tell your doctor if your symptoms do not get better or if they get worse. Visit your doctor or health care professional for regular checks on your progress. Because it may take several weeks to see the full effects of this medicine, it is important to continue your treatment as prescribed by your doctor. Patients and their families should watch out for new or worsening thoughts of suicide or depression. Also watch out for sudden changes in feelings such as feeling anxious, agitated, panicky, irritable, hostile, aggressive, impulsive, severely  restless, overly excited and hyperactive, or not being able to sleep. If this happens, especially at the beginning of treatment or after a change in dose, call your health care professional. Dennis Bast may get drowsy or dizzy. Do not drive, use machinery, or do anything that needs mental alertness until you know how this medicine affects you. Do not stand or sit up quickly, especially if you are an older patient. This reduces the risk of dizzy or fainting spells. Alcohol may interfere with the effect of this medicine. Avoid alcoholic drinks. This medicine may cause dry eyes and blurred vision. If you wear contact lenses you may feel some discomfort. Lubricating drops may help. See your eye doctor if the problem does not go away or is severe. Your mouth may get dry. Chewing sugarless gum, sucking hard candy and drinking plenty of water may help. Contact your doctor if the problem does not go away or is severe. What side effects may I notice from receiving this medicine? Side effects that you should report to your doctor or health care professional as soon as possible: -allergic reactions like skin rash, itching or hives, swelling of the face, lips, or tongue -fast, irregular heartbeat -feeling faint or lightheaded, falls -painful erections or other sexual dysfunction -suicidal thoughts or other mood changes -trembling Side effects that usually do not require medical attention (report to your doctor or health care professional if they continue or are bothersome): -constipation -headache -muscle aches or pains -nausea, vomiting -unusually weak or tired This list may not describe all possible side effects. Call your doctor for  medical advice about side effects. You may report side effects to FDA at 1-800-FDA-1088. Where should I keep my medicine? Keep out of the reach of children. Store at room temperature between 15 and 30 degrees C (59 to 86 degrees F). Protect from light. Keep container tightly closed.  Throw away any unused medicine after the expiration date. NOTE: This sheet is a summary. It may not cover all possible information. If you have questions about this medicine, talk to your doctor, pharmacist, or health care provider.  2015, Elsevier/Gold Standard. (2013-07-10 15:46:28)

## 2014-10-01 NOTE — Assessment & Plan Note (Signed)
Patient continues to complain of low back pain, and hopes that the injections in her back would help her pain. However, he has missed her pain clinic appointments repeatedly. She is currently requesting for wheelchair, a recliner, and the ramp for her house. She has home health PT.  Plan - Will send her over to outpatient PT for evaluation for equipment needs. - I encouraged her to make an appointment with her pain management - In the, meantime, she can continue with OTC Tylenol. Opiate prescriptions have been declined due recurrent falls.

## 2014-10-01 NOTE — Progress Notes (Signed)
Patient ID: Joy Butler, female   DOB: [birth year], 63 y.o.   MRN: [id number]   Subjective:   HPI: [name] is a 63 y.o. 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  She presents for a routine clinic visit mostly regarding her chronic medical problems.   Please see the A&P for the status of the pt's chronic medical problems.   ROS: Constitutional: Denies fever, chills, diaphoresis, appetite change and fatigue. She reports recent weight gain Respiratory: Reports increasing dyspnea with exertion, requesting for referral for sleep study. Denies cough, chest tightness, and wheezing. Denies chest pain. CVS: No chest pain, palpitations and leg swelling.  GI: No abdominal pain, nausea, vomiting, bloody stools GU: No dysuria, frequency, hematuria, or flank pain.  MSK: No myalgias, back pain, joint swelling, arthralgias  Psych: She reports that her depressive symptoms have recently been uncontrolled on her current Celexa 20 mg daily. She also has problems with sleep, and the Elavil 25 mg at bedtime is not helping much. No SI or SA.    Objective:  Physical Exam: Filed Vitals:   10/01/14 1426  BP: 147/77  Pulse: 102  Temp: 98.3 F (36.8 C)  TempSrc: Oral  Height: 5\' 9"  (1.753 m)  Weight: 274 lb 9.6 oz (124.558 kg)  SpO2: 91%   General: Obese. No acute distress.  HEENT: Normal oral mucosa. MMM.  Lungs: CTA bilaterally. Heart: RRR; no extra sounds or murmurs  Abdomen: Non-distended, normal bowel sounds, soft, nontender; no hepatosplenomegaly  Extremities: Left foot wrapped with dressings, which appear clean and dry. No pedal edema. No joint swelling or tenderness. Neurologic: Normal EOM,  Alert and oriented x3. No obvious neurologic/cranial nerve deficits.  Assessment & Plan:  Discussed  case with my attending in the clinic, Dr. Daryll Drown See problem based charting.

## 2014-10-01 NOTE — Assessment & Plan Note (Signed)
BP Readings from Last 3 Encounters:  10/01/14 147/77  09/16/14 165/91  08/02/14 138/82    Lab Results  Component Value Date   NA 139 09/16/2014   K 4.2 09/16/2014   CREATININE 1.24* 09/16/2014    Assessment: Blood pressure control: controlled Progress toward BP goal:  at goal Comments: Blood pressure is slightly elevated. Patient states he did not take her medications yesterday.  Plan: Medications: Continue with amlodipine 10 mg daily. Continue with Hyzaar 100-25 mg daily.   Educational resources provided:   Self management tools provided:   Other plans: Followup in 2 weeks. Encouraged compliance with medication.

## 2014-10-01 NOTE — Assessment & Plan Note (Signed)
Symptoms of uncontrolled, with current Celexa 20 mg daily. She also has a combination of insomnia. Initially, she had reported some good results with Celexa. Plan -I will switch her to trazodone starting at 25 mg at bedtime as an alternative antidepressant to Celexa. It might also help with insomnia. Will consider gradual dose increment if needed -Taper celexa by taking 10 mg over the next 2 weeks and stop - stop Elavil - follow up in 2 weeks

## 2014-10-01 NOTE — Assessment & Plan Note (Signed)
On chronic coumadin. Mostly she remained subtherapeutic. Plan -Will continue with Coumadin. -Check INR today -Followup with Dr. Elie Confer

## 2014-10-01 NOTE — Assessment & Plan Note (Signed)
Referral to outpatient PT for physical function assessment, and equipment recommendations.

## 2014-10-01 NOTE — Assessment & Plan Note (Signed)
No acute signs of infection currently.  Plan: Followup with the wound Center.

## 2014-10-02 LAB — POCT INR: INR: 1.1

## 2014-10-02 NOTE — Patient Instructions (Signed)
Patient instructed to take medications as defined in the Anti-coagulation Track section of this encounter.  Patient instructed to take today's dose.  Patient verbalized understanding of these instructions.    

## 2014-10-02 NOTE — Progress Notes (Addendum)
Anti-Coagulation Progress Note  Joy Butler is a 63 y.o. female who reports to the clinic for monitoring of anticoagulation treatment.   RECENT RESULTS: Recent results are below, the most recent result is correlated with a dose of 95 mg. per week: Lab Results  Component Value Date   INR 1.1 10/02/2014   INR 1.1 10/01/2014   INR 1.1 10/01/2014   INR 1.64* 09/16/2014    Weekly dose was increased by 3% to 97.5 mg per week. Of note, patient has a history of poor adherence.  ANTI-COAG DOSE: INR as of 10/01/2014 and Previous Dosing Information   INR Dt INR Goal Wkly Tot Sun Mon Tue Wed Thu Fri Sat   10/01/2014 1.1 2.0-3.0 95 mg 12.5 mg 15 mg 12.5 mg 15 mg 12.5 mg 15 mg 12.5 mg    Anticoagulation Dose Instructions as of 10/01/2014     Total Sun Mon Tue Wed Thu Fri Sat   New Dose 97.5 mg 12.5 mg 17.5 mg 12.5 mg 15 mg 12.5 mg 15 mg 12.5 mg     (5 mg x 2.5)  (5 mg x 3.5)  (5 mg x 2.5)  (5 mg x 3)  (5 mg x 2.5)  (5 mg x 3)  (5 mg x 2.5)                           ANTICOAG SUMMARY: Anticoagulation Episode Summary   Current INR goal 2.0-3.0  Next INR check 10/15/2014  INR from last check 1.1! (10/01/2014)  Most recent INR 1.1! (10/02/2014)  Weekly max dose   Target end date Indefinite  INR check location   Preferred lab   Send INR reminders to    Indications  Personal history of PE (pulmonary embolism) (Resolved) [Z86.711] Recurrent pulmonary embolism [I26.99] Long term current use of anticoagulant therapy [Z79.01]        Comments Documented problem list reveals recurrence of PE. Duration of therapy should be indefinite but with periodic assessment and advice and consent of the patient insofar as continuing indefiite warfarin therapy as per guidelines published in supplement to Journal CHEST.       PATIENT INSTRUCTIONS: Patient Instructions  Patient instructed to take medications as defined in the Anti-coagulation Track section of this encounter.  Patient instructed  to take today's dose.  Patient verbalized understanding of these instructions.   FOLLOW-UP 2 weeks  Patient was seen in clinic by Elicia Lamp, PharmD, PGY1 pharmacy  resident. I agree with the assessment, diagnosis, and plan of care documented by the pharmacy resident. Flossie Dibble, PharmD BCPS, BCACP

## 2014-10-02 NOTE — Progress Notes (Signed)
Internal Medicine Clinic Attending  Case discussed with Dr. Alice Rieger soon after the resident saw the patient.  We reviewed the resident's history and exam and pertinent patient test results.  I agree with the assessment, diagnosis, and plan of care documented in the resident's note.  Trazadone is at a very low dose for depression.  Starting dose for depression is 150mg .  If depression is main reason for medication, would consider uptitrating to a good dose, considering side effects.

## 2014-10-03 ENCOUNTER — Encounter (HOSPITAL_BASED_OUTPATIENT_CLINIC_OR_DEPARTMENT_OTHER): Payer: Medicare Other | Attending: General Surgery

## 2014-10-05 ENCOUNTER — Other Ambulatory Visit: Payer: Self-pay | Admitting: Internal Medicine

## 2014-10-05 ENCOUNTER — Other Ambulatory Visit (HOSPITAL_COMMUNITY): Payer: Self-pay | Admitting: Internal Medicine

## 2014-10-05 NOTE — Progress Notes (Signed)
Coumadin for recurrent VTE.  INR low, coumadin dose increased.  I have reviewed Dr. Julianne Rice note.

## 2014-10-15 ENCOUNTER — Ambulatory Visit: Payer: Medicare Other

## 2014-10-24 ENCOUNTER — Encounter: Payer: Medicare Other | Admitting: Internal Medicine

## 2014-10-24 ENCOUNTER — Encounter: Payer: Self-pay | Admitting: Internal Medicine

## 2014-10-30 ENCOUNTER — Other Ambulatory Visit: Payer: Self-pay | Admitting: Internal Medicine

## 2014-11-07 ENCOUNTER — Ambulatory Visit: Payer: Medicare Other | Admitting: Internal Medicine

## 2014-11-09 ENCOUNTER — Telehealth: Payer: Self-pay | Admitting: *Deleted

## 2014-11-09 NOTE — Telephone Encounter (Signed)
Beth, [place] calls for verbal to continue [facility] x 4 weeks, verbal is given and she also states when pt comes to appt the foot wound needs to be addressed

## 2014-11-10 ENCOUNTER — Telehealth: Payer: Self-pay | Admitting: Internal Medicine

## 2014-11-10 MED ORDER — DICLOFENAC SODIUM 1 % TD GEL: 4.0000 g | Freq: Four times a day (QID) | TRANSDERMAL | Status: DC

## 2014-11-10 NOTE — Telephone Encounter (Signed)
  Reason for call:   I placed an outgoing call following a page from Ms. [name] at 5. 30pm  regarding chronic back pain, unchanged in severity, she is asking that I call in pain meds to her pharmacy, that she was not able to pick up pain meds previously prescribed and she ran out of the ones prescribed in Oct. Pt denies lower extremity weakness, paraesthesias- saddle, has peripheral neuropathy. No falls. Pain is basically unchanged. Pt is insisting on getting pain meds. Pt say she has tried heat packs without relief. Pt say she has tried tylenol and NSAIDS without relief.   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 Pt was not prescribed pain meds because of her hx of frequent falls. Pt with CKD- 3.   Assessment/ Plan:   Sent in prescription for Voltaren gel. Also encouraged pt to engage in physical therapy. Try heat packs or cold packs, which ever is more helpful. Advised patient that considering her Kidney function she should avoid NSAIDS.   Told patient she could make an appointment to come to clinic and see [place].   As always, pt is advised that if symptoms worsen or new symptoms arise, they should go to an urgent care facility or to to ER for further evaluation.   Bethena Roys, MD   11/10/2014, 6:17 PM

## 2014-11-13 ENCOUNTER — Ambulatory Visit: Payer: Medicare Other | Admitting: Internal Medicine

## 2014-11-14 ENCOUNTER — Encounter: Payer: Self-pay | Admitting: Internal Medicine

## 2014-11-19 ENCOUNTER — Telehealth: Payer: Self-pay | Admitting: *Deleted

## 2014-11-19 NOTE — Telephone Encounter (Signed)
Okay. Thanks Dannielle Karvonen

## 2014-11-19 NOTE — Telephone Encounter (Signed)
Call from [place], [place] with [facility] - # [phone] [facility] has been doing wound care on left foot and need pt to see podiatry to help with callus area.  Pt certification time has run out ( 60 days )and pt is independent with wound care. Pt missed 3 appointments here for evaluation and referral to podiatry.  She will need a new referral to [facility] if needed to resume wound care after podiatry sees pt.  Pt called and scheduled for OV this Wednesday. Hope to do referral then if needed.

## 2014-11-21 ENCOUNTER — Encounter: Payer: Self-pay | Admitting: Internal Medicine

## 2014-11-21 ENCOUNTER — Ambulatory Visit (INDEPENDENT_AMBULATORY_CARE_PROVIDER_SITE_OTHER): Payer: Medicare Other | Admitting: Internal Medicine

## 2014-11-21 VITALS — BP 141/83 | HR 106 | Temp 98.1°F | Ht 69.0 in | Wt 274.4 lb

## 2014-11-21 DIAGNOSIS — E08621 Diabetes mellitus due to underlying condition with foot ulcer: Secondary | ICD-10-CM

## 2014-11-21 DIAGNOSIS — Z794 Long term (current) use of insulin: Secondary | ICD-10-CM | POA: Diagnosis not present

## 2014-11-21 DIAGNOSIS — E11621 Type 2 diabetes mellitus with foot ulcer: Secondary | ICD-10-CM | POA: Diagnosis not present

## 2014-11-21 DIAGNOSIS — Z720 Tobacco use: Secondary | ICD-10-CM

## 2014-11-21 DIAGNOSIS — M545 Low back pain: Secondary | ICD-10-CM | POA: Diagnosis not present

## 2014-11-21 DIAGNOSIS — L97519 Non-pressure chronic ulcer of other part of right foot with unspecified severity: Secondary | ICD-10-CM

## 2014-11-21 DIAGNOSIS — G8929 Other chronic pain: Secondary | ICD-10-CM

## 2014-11-21 DIAGNOSIS — E118 Type 2 diabetes mellitus with unspecified complications: Secondary | ICD-10-CM

## 2014-11-21 DIAGNOSIS — M549 Dorsalgia, unspecified: Secondary | ICD-10-CM

## 2014-11-21 DIAGNOSIS — F329 Major depressive disorder, single episode, unspecified: Secondary | ICD-10-CM

## 2014-11-21 DIAGNOSIS — Z0000 Encounter for general adult medical examination without abnormal findings: Secondary | ICD-10-CM

## 2014-11-21 DIAGNOSIS — F32A Depression, unspecified: Secondary | ICD-10-CM

## 2014-11-21 DIAGNOSIS — L97529 Non-pressure chronic ulcer of other part of left foot with unspecified severity: Principal | ICD-10-CM

## 2014-11-21 MED ORDER — TRAMADOL HCL 50 MG PO TABS: 50.0000 mg | ORAL_TABLET | Freq: Four times a day (QID) | ORAL | Status: DC | PRN

## 2014-11-21 NOTE — Assessment & Plan Note (Addendum)
Pt due for A1c. Last A1c in August >13. She states that her CBGs are b/w 140s-240s and denies any hypoglycemia. She did not bring her meter today but states she is checking CBGs 1-2x daily. She endorses compliance with her Metformin 1000mg  BID and NPH 48u BID. She declined an A1c today, stating that she had to leave but will return in a few weeks.  - She is to return to the clinic in 2 weeks and bring her meter with her. - Will check A1c at next visit

## 2014-11-21 NOTE — Assessment & Plan Note (Addendum)
Pt has a cold today, so holding on flu vaccine today.  Eye exam ordered today

## 2014-11-21 NOTE — Assessment & Plan Note (Addendum)
Pt referred to PT in the past but PT was unable to get in touch with the patient.  - Will re-refer to PT. - Tramadol 50mg  1 tablet q6h PRN, disp #20, 0 refills

## 2014-11-21 NOTE — Assessment & Plan Note (Addendum)
Ulcer is deep, but is very clean. Home health has been taking care of the wound but wanted Podiatry to see the patient to remove the callus on the toe next to the ulceration. Feet otherwise look healthy. - Continue HH wound care - Referring to Podiatry

## 2014-11-21 NOTE — Patient Instructions (Signed)
**  We are scheduling an eye appointment for you and will call you with the date and time.  **Please be sure to call about the Physical Therapy. This will help you back pain.   **Please return to the clinic in the next few weeks for your A1c and bring your meter.  General Instructions:   Please bring your medicines with you each time you come to clinic.  Medicines may include prescription medications, over-the-counter medications, herbal remedies, eye drops, vitamins, or other pills.   Progress Toward Treatment Goals:  Treatment Goal 11/21/2014  Hemoglobin A1C unchanged  Blood pressure at goal  Stop smoking smoking the same amount  Prevent falls -    Self Care Goals & Plans:  Self Care Goal 11/21/2014  Manage my medications take my medicines as prescribed; bring my medications to every visit; refill my medications on time; follow the sick day instructions if I am sick  Monitor my health keep track of my blood glucose; check my feet daily  Eat healthy foods eat more vegetables; eat fruit for snacks and desserts; eat foods that are low in salt; eat baked foods instead of fried foods; eat smaller portions; drink diet soda or water instead of juice or soda  Be physically active find an activity I enjoy  Stop smoking -  Prevent falls -  Meeting treatment goals -    Home Blood Glucose Monitoring 11/21/2014  Check my blood sugar once a day  When to check my blood sugar before meals     Care Management & Community Referrals:  Referral 10/01/2014  Referrals made for care management support none needed  Referrals made to community resources -

## 2014-11-21 NOTE — Progress Notes (Signed)
Patient ID: Joy Butler, female   DOB: [birth year], 63 y.o.   MRN: [id number]  Subjective:   Patient ID: Joy Butler female   DOB: 1951/06/11 63 y.o.   MRN: [id number]  HPI: Joy Butler is a 63 y.o. 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  She has a diabetic foot wound that [facility] has been taking care of. Her HH with [facility] needs to be renewed today for them to continue wound care.   She c/o back pain, but PT was ordered but they were not able to get intouch with the patient.   She is in need of a diabetic eye exam.  She did not bring in her glucometer today. She is checking her CBGs about once a day and states her CBHs have been in the 140s-240s. Denies any hypoglycemia.   She states that she just started a new antidepressant from Dr. Raliegh Ip this week. In looking at the EMR, she was being weaned off Celexa and Trazodone was started to help her depression and insomnia.   She does endorse a fever (101) last week, chronic low back pain, peripheral edema, depression, and foot wound. Denies pain at wound, foul smell, or discharge.   Past Medical History  Diagnosis Date  . Hypertension   . Pulmonary embolism     2011 treated at [facility] in [place].  Was on Coumadin for over a year.  no known family history  . UTI (urinary tract infection)   . High cholesterol   . 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 Depression   . Neuropathy     feet  . Kidney stones   . Pneumonia     "several times" (07/26/2014)  . Type II diabetes mellitus dx'd 2000  . Daily headache   . Migraine     "last one was in the 1990's" (07/26/2014)  . Arthritis     "knees" (07/26/2014)  . Chronic back pain    Current Outpatient Prescriptions  Medication Sig Dispense Refill  . amLODipine  (NORVASC) 10 MG tablet Take 1 tablet (10 mg total) by mouth daily. 90 tablet 3  . diclofenac sodium (VOLTAREN) 1 % GEL Apply 4 g topically 4 (four) times daily. 1 Tube 0  . gabapentin (NEURONTIN) 300 MG capsule Take 1 capsule (300 mg total) by mouth 3 (three) times daily. 90 capsule 0  . gabapentin (NEURONTIN) 300 MG capsule TAKE 1 CAPSULE BY MOUTH THREE TIMES A DAY 270 capsule 3  . insulin NPH-regular Human (NOVOLIN 70/30) (70-30) 100 UNIT/ML injection Inject 48 Units into the skin 2 (two) times daily with a meal. 10 mL 12  . losartan-hydrochlorothiazide (HYZAAR) 100-25 MG per tablet Take 1 tablet by mouth daily.    . metFORMIN (GLUCOPHAGE) 1000 MG tablet Take 1 tablet (1,000 mg total) by mouth 2 (two) times daily with a meal. 60 tablet 6  . metFORMIN (GLUCOPHAGE) 500 MG tablet Take 2 tablets (1,000 mg total) by mouth 2 (two) times daily with a meal. 180 tablet 3  . Multiple Vitamin (MULTIVITAMIN WITH MINERALS) TABS tablet Take 1 tablet by mouth daily.    . nicotine (NICODERM CQ - DOSED IN MG/24 HOURS) 14 mg/24hr patch Place 1 patch (14 mg total) onto the skin daily. 30 patch 0  .  omeprazole (PRILOSEC) 20 MG capsule Take 1 capsule (20 mg total) by mouth daily. 90 capsule 3  . pravastatin (PRAVACHOL) 40 MG tablet Take 1 tablet (40 mg total) by mouth every evening. 90 tablet 3  . predniSONE (DELTASONE) 10 MG tablet Take 1-20 mg by mouth daily with breakfast. Tapered course: take 2 tablets (20 mg) daily until 07/25/14, take 1 1/2 tablets (15 mg) daily 07/26/14 thru 08/09/14, take 1 tablet (10 mg) daily 08/10/14 thru 02/17/15, then decrease by 1 mg every month until stopped    . SANTYL ointment APPLY TOPICALLY DAILY 30 g 1  . traZODone (DESYREL) 25 mg TABS tablet Take 0.5 tablets (25 mg total) by mouth at bedtime. 30 tablet 0  . warfarin (COUMADIN) 5 MG tablet Take 10-12.5 mg by mouth every evening. Take 2 1/2 tablets (12.5 mg) on Monday and Thursday, take 2 tablets (10 mg) on all other days of the week    .  warfarin (COUMADIN) 5 MG tablet TAKE 2.5 TABLETS (12.5 MG TOTAL) BY MOUTH ONE TIME ONLY AT 6 PM 90 tablet 2   No current facility-administered medications for this visit.   Family History  Problem Relation Age of Onset  . Adopted: Yes  . Hypertension Mother    History   Social History  . Marital Status: Single    Spouse Name: N/A    Number of Children: N/A  . Years of Education: N/A   Social History Main Topics  . Smoking status: Current Every Day Smoker -- 1.00 packs/day for 44 years    Types: Cigarettes  . Smokeless tobacco: Never Used     Comment: 1/2 PPD  . Alcohol Use: No  . Drug Use: No  . Sexual Activity: Yes   Other Topics Concern  . None   Social History Narrative   Previously divorced now engaged with her partner of 4 years.  Has 6 grown children with 21 grandchildren.  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  Moved to [place] in August 2013 and was transiently homeless until first part of October 2013.     Review of Systems: A 12 point ROS was performed; pertinent positives and negatives were noted in the HPI   Objective:  Physical Exam: Filed Vitals:   11/21/14 1422  BP: 141/83  Pulse: 106  Temp: 98.1 F (36.7 C)  TempSrc: Oral  Height: 5\' 9"  (1.753 m)  Weight: 274 lb 6.4 oz (124.467 kg)  SpO2: 95%   Constitutional: Vital signs reviewed.  Patient is female in no acute distress and cooperative with exam. Alert and oriented x3.  Head: Normocephalic and atraumatic Eyes: PERRL, EOMI.  No scleral icterus.  Ears: TM intact but unable to completely visualize 2/2 cerumen in the canal. Nontender.  Cardiovascular: RRR, no MRG Pulmonary/Chest: Normal respiratory effort, CTAB, no wheezes, rales, or rhonchi Abdominal: Soft. Non-tender, non-distended, bowel sounds are normal, no guarding present.  Musculoskeletal: Moves all 4 extremities Neurological: A&O x3, cranial nerve II-XII are grossly intact  Skin: R great toe  with large diabetic ulcer with clean base, no purulence, minimal drainage, no odor.  Psychiatric: Depressed mood and flattened affect. Speech and behavior is normal.   Assessment & Plan:   Please refer to Problem List based Assessment and Plan

## 2014-11-21 NOTE — Assessment & Plan Note (Addendum)
She is currently taking Trazodone 25mg  at bedtime, but states that she just started this medication this week. She is still feeling depressed but denies thoughts of self harm or harm to others. We will have to see how she does with the Trazodone.

## 2014-11-25 NOTE — Progress Notes (Signed)
Case discussed with Dr. Glenn soon after the resident saw the patient.  We reviewed the resident's history and exam and pertinent patient test results.  I agree with the assessment, diagnosis, and plan of care documented in the resident's note. 

## 2014-11-27 ENCOUNTER — Telehealth: Payer: Self-pay | Admitting: *Deleted

## 2014-11-27 NOTE — Telephone Encounter (Signed)
[facility] called back and pt does have Kerlix and foam dsg.  This should last a few weeks. Pt instructed in signs of infection.  Pt does not have any extra money for Ido-flex.

## 2014-11-27 NOTE — Telephone Encounter (Signed)
Call from [place], [place] with [facility] - # [phone]  Nurse states pt has been discharged from home health for missing appointments.  She is independent with wound care. Pt called [facility]N and stated she is out of wound care supplies.  She was using iod-flex covered with foam dressing and Kerlix. She has appointment on 12/17 with foot doctor.  Instructed to use a  non adherent dsg to cover area.  Area is not draining.  Do you want me to schedule pt for office visit to assess wound and offer treatment? Not sure we can help with getting medical supplies.

## 2014-12-04 ENCOUNTER — Other Ambulatory Visit: Payer: Self-pay | Admitting: Internal Medicine

## 2014-12-06 ENCOUNTER — Encounter: Payer: Self-pay | Admitting: Physical Therapy

## 2014-12-06 ENCOUNTER — Ambulatory Visit: Payer: Medicare Other | Attending: Internal Medicine | Admitting: Physical Therapy

## 2014-12-06 ENCOUNTER — Telehealth: Payer: Self-pay | Admitting: *Deleted

## 2014-12-06 DIAGNOSIS — R29898 Other symptoms and signs involving the musculoskeletal system: Secondary | ICD-10-CM | POA: Insufficient documentation

## 2014-12-06 DIAGNOSIS — R262 Difficulty in walking, not elsewhere classified: Secondary | ICD-10-CM | POA: Diagnosis not present

## 2014-12-06 DIAGNOSIS — M25561 Pain in right knee: Secondary | ICD-10-CM | POA: Diagnosis not present

## 2014-12-06 DIAGNOSIS — M545 Low back pain, unspecified: Secondary | ICD-10-CM

## 2014-12-06 DIAGNOSIS — M25562 Pain in left knee: Secondary | ICD-10-CM | POA: Diagnosis not present

## 2014-12-06 NOTE — Patient Instructions (Signed)
Knee to Chest   Lying supine, bend involved knee to chest _3__ times. Repeat with other leg. Do _2__ times per day.  Copyright  VHI. All rights reserved.  Lower Trunk Rotation   Bring both knees in to chest. Rotate from side to side, keeping knees together and feet off floor. Repeat __10_ times per set. Do __1-2__ sets per session. Do __2__ sessions per day.  http://orth.exer.us/151   Copyright  VHI. All rights reserved.  Sleeping on Back  Place pillow under knees. A pillow with cervical support and a roll around waist are also helpful. Copyright  VHI. All rights reserved.  Sleeping on Side Place pillow between knees. Use cervical support under neck and a roll around waist as needed. Copyright  VHI. All rights reserved.   Sleeping on Stomach   If this is the only desirable sleeping position, place pillow under lower legs, and under stomach or chest as needed.  Posture - Sitting   Sit upright, head facing forward. Try using a roll to support lower back. Keep shoulders relaxed, and avoid rounded back. Keep hips level with knees. Avoid crossing legs for long periods. Stand to Sit / Sit to Stand   To sit: Bend knees to lower self onto front edge of chair, then scoot back on seat. To stand: Reverse sequence by placing one foot forward, and scoot to front of seat. Use rocking motion to stand up.   Work Height and Reach  Ideal work height is no more than 2 to 4 inches below elbow level when standing, and at elbow level when sitting. Reaching should be limited to arm's length, with elbows slightly bent.  Bending  Bend at hips and knees, not back. Keep feet shoulder-width apart.    Posture - Standing   Good posture is important. Avoid slouching and forward head thrust. Maintain curve in low back and align ears over shoul- ders, hips over ankles.  Alternating Positions   Alternate tasks and change positions frequently to reduce fatigue and muscle tension. Take rest  breaks. Computer Work   Position work to Programmer, multimedia. Use proper work and seat height. Keep shoulders back and down, wrists straight, and elbows at right angles. Use chair that provides full back support. Add footrest and lumbar roll as needed.  Getting Into / Out of Car  Lower self onto seat, scoot back, then bring in one leg at a time. Reverse sequence to get out.  Dressing  Lie on back to pull socks or slacks over feet, or sit and bend leg while keeping back straight.    Housework - Sink  Place one foot on ledge of cabinet under sink when standing at sink for prolonged periods.   Pushing / Pulling  Pushing is preferable to pulling. Keep back in proper alignment, and use leg muscles to do the work.  Deep Squat   Squat and lift with both arms held against upper trunk. Tighten stomach muscles without holding breath. Use smooth movements to avoid jerking.  Avoid Twisting   Avoid twisting or bending back. Pivot around using foot movements, and bend at knees if needed when reaching for articles.  Carrying Luggage   Distribute weight evenly on both sides. Use a cart whenever possible. Do not twist trunk. Move body as a unit.   Lifting Principles .Maintain proper posture and head alignment. .Slide object as close as possible before lifting. .Move obstacles out of the way. .Test before lifting; ask for help if too heavy. .Tighten stomach  muscles without holding breath. .Use smooth movements; do not jerk. .Use legs to do the work, and pivot with feet. .Distribute the work load symmetrically and close to the center of trunk. .Push instead of pull whenever possible.   Ask For Help   Ask for help and delegate to others when possible. Coordinate your movements when lifting together, and maintain the low back curve.  Log Roll   Lying on back, bend left knee and place left arm across chest. Roll all in one movement to the right. Reverse to roll to the left. Always move  as one unit. Housework - Sweeping  Use long-handled equipment to avoid stooping.   Housework - Wiping  Position yourself as close as possible to reach work surface. Avoid straining your back.  Laundry - Unloading Wash   To unload small items at bottom of washer, lift leg opposite to arm being used to reach.  [place] close to area to be raked. Use arm movements to do the work. Keep back straight and avoid twisting.     Cart  When reaching into cart with one arm, lift opposite leg to keep back straight.   Getting Into / Out of Bed  Lower self to lie down on one side by raising legs and lowering head at the same time. Use arms to assist moving without twisting. Bend both knees to roll onto back if desired. To sit up, start from lying on side, and use same move-ments in reverse. Housework - Vacuuming  Hold the vacuum with arm held at side. Step back and forth to move it, keeping head up. Avoid twisting.   Laundry - IT consultant so that bending and twisting can be avoided.   Laundry - Unloading Dryer  Squat down to reach into clothes dryer or use a reacher.  Gardening - Weeding / Probation officer or Kneel. Knee pads may be helpful.

## 2014-12-06 NOTE — Telephone Encounter (Signed)
appts made and printed...td 

## 2014-12-06 NOTE — Therapy (Addendum)
[place], [place], [address] Phone: [phone]   Fax:  [phone]  Physical Therapy Evaluation  Patient Details  Name: Joy Butler MRN: [id number] Date of Birth: [birth year]  Encounter Date: 12/06/2014      PT End of Session - 12/06/14 1604    Visit Number 1   Number of Visits 16   Date for PT Re-Evaluation 01/31/15   PT Start Time 1525   PT Stop Time 1610   PT Time Calculation (min) 45 min   Activity Tolerance Patient limited by pain   Behavior During Therapy [facility] for tasks assessed/performed      Past Medical History  Diagnosis Date  . Hypertension   . Pulmonary embolism     2011 treated at [facility] in [place].  Was on Coumadin for over a year.  no known family history  . UTI (urinary tract infection)   . High cholesterol   . 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 Depression   . Neuropathy     feet  . Kidney stones   . Pneumonia     "several times" (07/26/2014)  . Type II diabetes mellitus dx'd 2000  . Daily headache   . Migraine     "last one was in the 1990's" (07/26/2014)  . Arthritis     "knees" (07/26/2014)  . Chronic back pain     Past Surgical History  Procedure Laterality Date  . Cholecystectomy      1980  . Esophagogastroduodenoscopy  08/19/2012    Procedure: ESOPHAGOGASTRODUODENOSCOPY (EGD);  Surgeon: Winfield Cunas., MD;  [facility] ENDOSCOPY;  Service: Endoscopy;  Laterality: N/A;  . Cesarean section  1982  . Carpal tunnel release Bilateral   . Tonsillectomy    . Artery biopsy Right 05/09/2014    Procedure: BIOPSY TEMPORAL ARTERY;  Surgeon: Rosetta Posner, MD;  Location: [facility];  Service: Vascular;  Laterality: Right;  . Appendectomy    . Vaginal hysterectomy    . Dilation and curettage of uterus  1982  . Cystoscopy w/ stone manipulation    . Breast biopsy Left   . Temporal artery biopsy / ligation Right 04/2014  . Eye surgery Bilateral   . Refractive  surgery Bilateral     There were no vitals taken for this visit.  Visit Diagnosis:  Bilateral low back pain without sciatica  Bilateral knee pain  Weakness of both lower extremities  Difficulty walking      Subjective Assessment - 12/06/14 1534    Symptoms Patient presents with pain in low back due to arthritis. She c/o weakness in Bilat. knees, swelling in Lt. LE.  She denies radicaular sx. She was recently refered to Dr. Theda Sers for knee pain.     Pertinent History Peripheral neuropathy, DM, HTN, obesity   Limitations Walking;Standing;House hold activities;Lifting;Sitting  Sit to stand, pain in knee wakes her from sleep,   How long can you sit comfortably? 1 hour   How long can you stand comfortably? <5 min   How long can you walk comfortably? 5 min   Diagnostic tests XR this year, MRI   Patient Stated Goals walk better   Currently in Pain? Yes   Pain Score 8    Pain Location Back   Pain Orientation Lower   Pain Descriptors / Indicators Aching   Pain Type Chronic pain   Pain Radiating Towards across beltline   Pain Onset More than  a month ago   Pain Frequency Intermittent   Aggravating Factors  walk and stand   Pain Relieving Factors changing position, pain meds, heat, can get to a 6/10-7/10   Effect of Pain on Daily Activities feels like im being held up right now in my body   Multiple Pain Sites Yes   Pain Score 6   Pain Type Chronic pain   Pain Location Knee   Pain Orientation Right;Left;Medial   Pain Descriptors / Indicators Aching   Pain Frequency Constant   Pain Onset Progressive          [facility] PT Assessment - 12/06/14 1542    Assessment   Medical Diagnosis low back pain   Onset Date --  years, chronic   Next MD Visit --  unsure   Prior Therapy --  no   Balance Screen   Has the patient fallen in the past 6 months Yes   How many times? --  3   Has the patient had a decrease in activity level because of a fear of falling?  Yes   Is the patient  reluctant to leave their home because of a fear of falling?  Yes   [place] Private residence   Living Arrangements Spouse/significant other   Type of [place] Access Stairs to enter   Entrance Stairs-Number of Steps 4   AROM   Lumbar Flexion --  in sitting, 25%   Lumbar Extension --  10 deg PAIN   Lumbar - Right Side Bend NT   Lumbar - Left Side Bend NT   Lumbar - Right Rotation --  25% no pain   Lumbar - Left Rotation --  25% no pain   Strength   Right Hip Flexion 3/5   Left Hip Flexion 3/5   Right Knee Flexion 3+/5   Right Knee Extension 3-/5   Left Knee Flexion 3+/5   Left Knee Extension 3/5   Right Ankle Dorsiflexion 3/5   Left Ankle Dorsiflexion 3/5   Straight Leg Raise   Findings Negative   Side  Right  Lt   Ambulation/Gait   Ambulation/Gait Yes   Ambulation/Gait Assistance 6: Modified independent (Device/Increase time)   Ambulation Distance (Feet) --  75 feet   Assistive device Straight cane           PT Education - 12/06/14 1604    Education provided Yes   Education Details PT/POC, HEP and posture/body International aid/development worker) Educated Patient   Methods Explanation;Demonstration;Handout   Comprehension Verbalized understanding;Verbal cues required;Need further instruction    Treatment: Pt. Performed single knee to chest bilaterally and lower trunk rotation for HEP.        PT Short Term Goals - 12/06/14 1617    PT SHORT TERM GOAL #1   Title Patient will be I with initial HEP   Time 4   Period Weeks   Status New   PT SHORT TERM GOAL #2   Title Pt. will complete [place] balance screen, set goal.    Time 4   Period Weeks   Status New   PT SHORT TERM GOAL #3   Title Pt. will tolerate standing for ther ex for up to 10 min with 2-3 rest breaks   Time 4   Period Weeks   Status New   PT SHORT TERM GOAL #4   Title Pt. will use ice/heat for pain relief at home and understand RICE  Time 4   Period Weeks   Status  New           PT Long Term Goals - December 15, 2014 1620    PT LONG TERM GOAL #1   Title Pt. will understand posture and body mechanics and demo in clinic.    Time 8   Period Weeks   Status New   PT LONG TERM GOAL #2   Title Pt. will be able to stand to do dishes for 10 min and report min pain increase.   Time 8   Period Weeks   Status New   PT LONG TERM GOAL #3   Title Balance goal  ______/56 to demo decrease fall risk.    Time 8   Period Weeks   Status New   PT LONG TERM GOAL #4   Title Pt. will demo strength increase in knee by 1 muscle grade (quad/hamstrings)   Time 8   Period Weeks   Status New               Plan - 12-15-14 1612    Clinical Impression Statement This patient presents with significant pain in LEs and back.  She will benefit from PT to provide flexion based HEP and reduce pain to a manageable level.  She was advised to seek Ortho consult for bilateral knee OA.     Pt will benefit from skilled therapeutic intervention in order to improve on the following deficits Abnormal gait;Decreased range of motion;Difficulty walking;Impaired flexibility;Improper body mechanics;Postural dysfunction;Increased edema;Decreased endurance;Decreased activity tolerance;Increased fascial restricitons;Obesity;Pain;Decreased balance;Decreased cognition;Decreased mobility;Decreased strength   Rehab Potential Good   PT Frequency 2x / week   PT Duration 8 weeks   PT Treatment/Interventions ADLs/Self Care Home Management;Moist Heat;Therapeutic activities;Patient/family education;Passive range of motion;Therapeutic exercise;DME Instruction;Ultrasound;Gait training;Balance training;Manual techniques;Neuromuscular re-education;Stair training;Cryotherapy;Electrical Stimulation;Functional mobility training   PT Next Visit Plan gentle stretching to trunk, NuStep, LE strengthening   PT Home Exercise Plan trunk rotation, single knee to chest   Consulted and Agree with Plan of Care Patient           G-Codes - December 15, 2014 1623    Functional Assessment Tool Used FOTO   Functional Limitation Mobility: Walking and moving around   Mobility: Walking and Moving Around Current Status [phone]) At least 60 percent but less than 80 percent impaired, limited or restricted   Mobility: Walking and Moving Around Goal Status [phone]) At least 40 percent but less than 60 percent impaired, limited or restricted       Problem List Patient Active Problem List   Diagnosis Date Noted  . Diabetic retinopathy 08/08/2014  . Hyperlipidemia 07/31/2014  . Abnormality of gait 07/30/2014  . Diabetic foot ulcer 07/26/2014  . Intertrigo 05/16/2014  . Temporal arteritis 04/05/2014  . Falls 08/14/2013  . Depression 03/01/2013  . Health care maintenance 01/30/2013  . Hypercholesteremia 01/29/2013  . Smoking 01/29/2013  . External hemorrhoids 01/29/2013  . Long term current use of anticoagulant therapy 10/24/2012  . Recurrent pulmonary embolism 10/17/2012  . Diabetic peripheral neuropathy 08/20/2012  . Chronic back pain 08/20/2012  . HTN (hypertension) 08/17/2012  . DM (diabetes mellitus), type 2 08/17/2012    PAA,JENNIFER 12-15-2014, 4:29 PM  [place], [place], [address] Phone: [phone]   Fax:  [phone]   Raeford Razor, PT 2014-12-15 4:30 PM Phone: [phone] Fax: [phone]  PHYSICAL THERAPY DISCHARGE SUMMARY  Visits from Start of Care: 1  Current functional level related to goals /  functional outcomes: unknown   Remaining deficits: unknown   Education / Equipment: None, initial eval  Plan: Patient agrees to discharge.  Patient goals were not met. Patient is being discharged due to not returning since the last visit.  ?????    Raeford Razor, PT 03/19/2016 2:51 PM Phone: [phone] Fax: [phone]

## 2014-12-10 ENCOUNTER — Other Ambulatory Visit: Payer: Self-pay | Admitting: Internal Medicine

## 2014-12-24 ENCOUNTER — Ambulatory Visit: Payer: Medicare Other | Attending: Internal Medicine | Admitting: Rehabilitation

## 2014-12-24 DIAGNOSIS — M25561 Pain in right knee: Secondary | ICD-10-CM | POA: Insufficient documentation

## 2014-12-24 DIAGNOSIS — M25562 Pain in left knee: Secondary | ICD-10-CM | POA: Insufficient documentation

## 2014-12-24 DIAGNOSIS — M545 Low back pain: Secondary | ICD-10-CM | POA: Insufficient documentation

## 2014-12-24 DIAGNOSIS — R262 Difficulty in walking, not elsewhere classified: Secondary | ICD-10-CM | POA: Insufficient documentation

## 2014-12-24 DIAGNOSIS — R29898 Other symptoms and signs involving the musculoskeletal system: Secondary | ICD-10-CM | POA: Insufficient documentation

## 2014-12-26 ENCOUNTER — Ambulatory Visit: Payer: Medicare Other | Admitting: Rehabilitation

## 2014-12-28 ENCOUNTER — Other Ambulatory Visit: Payer: Self-pay | Admitting: Internal Medicine

## 2014-12-31 ENCOUNTER — Ambulatory Visit: Payer: Medicare Other | Admitting: Rehabilitation

## 2015-01-01 ENCOUNTER — Other Ambulatory Visit: Payer: Self-pay | Admitting: Internal Medicine

## 2015-01-02 ENCOUNTER — Encounter: Payer: Medicare Other | Admitting: Rehabilitation

## 2015-01-09 LAB — HM DIABETES EYE EXAM

## 2015-01-21 ENCOUNTER — Encounter: Payer: Self-pay | Admitting: *Deleted

## 2015-01-23 ENCOUNTER — Encounter: Payer: Self-pay | Admitting: Internal Medicine

## 2015-01-23 DIAGNOSIS — H269 Unspecified cataract: Secondary | ICD-10-CM | POA: Insufficient documentation

## 2015-01-29 ENCOUNTER — Other Ambulatory Visit: Payer: Self-pay | Admitting: Internal Medicine

## 2015-02-07 ENCOUNTER — Ambulatory Visit (INDEPENDENT_AMBULATORY_CARE_PROVIDER_SITE_OTHER): Payer: Medicare Other | Admitting: Internal Medicine

## 2015-02-07 ENCOUNTER — Encounter: Payer: Self-pay | Admitting: Internal Medicine

## 2015-02-07 VITALS — BP 153/91 | HR 113 | Temp 97.3°F | Ht 69.0 in | Wt 252.5 lb

## 2015-02-07 DIAGNOSIS — N6002 Solitary cyst of left breast: Secondary | ICD-10-CM

## 2015-02-07 DIAGNOSIS — E119 Type 2 diabetes mellitus without complications: Secondary | ICD-10-CM | POA: Diagnosis not present

## 2015-02-07 DIAGNOSIS — Z0000 Encounter for general adult medical examination without abnormal findings: Secondary | ICD-10-CM

## 2015-02-07 DIAGNOSIS — Z86711 Personal history of pulmonary embolism: Secondary | ICD-10-CM | POA: Diagnosis not present

## 2015-02-07 DIAGNOSIS — N6009 Solitary cyst of unspecified breast: Secondary | ICD-10-CM | POA: Insufficient documentation

## 2015-02-07 DIAGNOSIS — Z7901 Long term (current) use of anticoagulants: Secondary | ICD-10-CM | POA: Diagnosis not present

## 2015-02-07 DIAGNOSIS — M549 Dorsalgia, unspecified: Secondary | ICD-10-CM | POA: Diagnosis not present

## 2015-02-07 DIAGNOSIS — Z794 Long term (current) use of insulin: Secondary | ICD-10-CM

## 2015-02-07 DIAGNOSIS — R197 Diarrhea, unspecified: Secondary | ICD-10-CM | POA: Insufficient documentation

## 2015-02-07 DIAGNOSIS — E118 Type 2 diabetes mellitus with unspecified complications: Secondary | ICD-10-CM

## 2015-02-07 DIAGNOSIS — H6123 Impacted cerumen, bilateral: Secondary | ICD-10-CM

## 2015-02-07 DIAGNOSIS — G8929 Other chronic pain: Secondary | ICD-10-CM

## 2015-02-07 DIAGNOSIS — H6120 Impacted cerumen, unspecified ear: Secondary | ICD-10-CM | POA: Insufficient documentation

## 2015-02-07 DIAGNOSIS — L304 Erythema intertrigo: Secondary | ICD-10-CM

## 2015-02-07 LAB — URINALYSIS, ROUTINE W REFLEX MICROSCOPIC
Ketones, ur: 15 mg/dL — AB
Leukocytes, UA: NEGATIVE
NITRITE: NEGATIVE
Protein, ur: >300 mg/dL — AB
Specific Gravity, Urine: 1.034 — ABNORMAL HIGH (ref 1.005–1.030)
Urobilinogen, UA: 1 mg/dL (ref 0.0–1.0)
pH: 6 (ref 5.0–8.0)

## 2015-02-07 LAB — COMPREHENSIVE METABOLIC PANEL
ALT: 9 U/L (ref 0–35)
AST: 15 U/L (ref 0–37)
Albumin: 2.5 g/dL — ABNORMAL LOW (ref 3.5–5.2)
Alkaline Phosphatase: 114 U/L (ref 39–117)
Anion gap: 10 (ref 5–15)
BUN: 11 mg/dL (ref 6–23)
CHLORIDE: 102 mmol/L (ref 96–112)
CO2: 26 mmol/L (ref 19–32)
Calcium: 9 mg/dL (ref 8.4–10.5)
Creatinine, Ser: 1.42 mg/dL — ABNORMAL HIGH (ref 0.50–1.10)
GFR, EST AFRICAN AMERICAN: 44 mL/min — AB (ref >90)
GFR, EST NON AFRICAN AMERICAN: 38 mL/min — AB (ref >90)
Glucose, Bld: 249 mg/dL — ABNORMAL HIGH (ref 70–99)
POTASSIUM: 3.6 mmol/L (ref 3.5–5.1)
Sodium: 138 mmol/L (ref 135–145)
TOTAL PROTEIN: 7.2 g/dL (ref 6.0–8.3)
Total Bilirubin: 0.5 mg/dL (ref 0.3–1.2)

## 2015-02-07 LAB — GLUCOSE, CAPILLARY: Glucose-Capillary: 234 mg/dL — ABNORMAL HIGH (ref 70–99)

## 2015-02-07 LAB — CBC WITH DIFFERENTIAL/PLATELET
Basophils Absolute: 0 10*3/uL (ref 0.0–0.1)
Basophils Relative: 0 % (ref 0–1)
EOS PCT: 3 % (ref 0–5)
Eosinophils Absolute: 0.3 10*3/uL (ref 0.0–0.7)
HCT: 44.2 % (ref 36.0–46.0)
Hemoglobin: 14.3 g/dL (ref 12.0–15.0)
Lymphocytes Relative: 26 % (ref 12–46)
Lymphs Abs: 2.4 10*3/uL (ref 0.7–4.0)
MCH: 26.2 pg (ref 26.0–34.0)
MCHC: 32.4 g/dL (ref 30.0–36.0)
MCV: 81 fL (ref 78.0–100.0)
Monocytes Absolute: 0.8 10*3/uL (ref 0.1–1.0)
Monocytes Relative: 9 % (ref 3–12)
Neutro Abs: 5.7 10*3/uL (ref 1.7–7.7)
Neutrophils Relative %: 62 % (ref 43–77)
Platelets: 390 10*3/uL (ref 150–400)
RBC: 5.46 MIL/uL — ABNORMAL HIGH (ref 3.87–5.11)
RDW: 18.1 % — AB (ref 11.5–15.5)
WBC: 9.2 10*3/uL (ref 4.0–10.5)

## 2015-02-07 LAB — URINE MICROSCOPIC-ADD ON

## 2015-02-07 LAB — POCT GLYCOSYLATED HEMOGLOBIN (HGB A1C): Hemoglobin A1C: 8.5

## 2015-02-07 LAB — POCT INR: INR: 1

## 2015-02-07 MED ORDER — NYSTATIN-TRIAMCINOLONE 100000-0.1 UNIT/GM-% EX OINT
1.0000 | TOPICAL_OINTMENT | Freq: Two times a day (BID) | CUTANEOUS | Status: DC
Start: 2015-02-07 — End: 2015-09-23

## 2015-02-07 MED ORDER — CARBAMIDE PEROXIDE 6.5 % OT SOLN: 5.0000 [drp] | Freq: Two times a day (BID) | OTIC | Status: DC

## 2015-02-07 MED ORDER — TRAMADOL HCL 50 MG PO TABS: 50.0000 mg | ORAL_TABLET | Freq: Four times a day (QID) | ORAL | Status: DC | PRN

## 2015-02-07 MED ORDER — DICYCLOMINE HCL 10 MG PO CAPS: 10.0000 mg | ORAL_CAPSULE | Freq: Three times a day (TID) | ORAL | Status: DC

## 2015-02-07 NOTE — Patient Instructions (Signed)
General Instructions: -Take Bentyl 10mg  before meals, up to 3 times per day. This is for the diarrhea and abdominal cramps.  -Continue drinking water to stay hydrated.  -Please return to the clinic or go to the ED if you have worsening in diarrhea, fever/chills, nausea and vomiting.  -Take Tramadol 50mg  every 6 hours as needed for the back pain.  -Please bring the stool sample to the clinic tomorrow.  -You have been referred to the [facility] for an ultrasound of your breast.  -You may use Debrox drops in your ears to help soften the ear wax.  -You may use the ointment for the rash under your breasts twice per day. Keep the area dry.  -Follow up with [place] in 1-2 months or sooner as needed.    Please bring your medicines with you each time you come to clinic.  Medicines may include prescription medications, over-the-counter medications, herbal remedies, eye drops, vitamins, or other pills.   Progress Toward Treatment Goals:  Treatment Goal 11/21/2014  Hemoglobin A1C unchanged  Blood pressure at goal  Stop smoking smoking the same amount  Prevent falls -    Self Care Goals & Plans:  Self Care Goal 02/07/2015  Manage my medications take my medicines as prescribed; bring my medications to every visit; refill my medications on time; follow the sick day instructions if I am sick  Monitor my health keep track of my blood glucose; check my feet daily  Eat healthy foods eat more vegetables; eat fruit for snacks and desserts; eat foods that are low in salt; eat baked foods instead of fried foods; eat smaller portions; drink diet soda or water instead of juice or soda  Be physically active find an activity I enjoy  Stop smoking -  Prevent falls -  Meeting treatment goals -    Home Blood Glucose Monitoring 11/21/2014  Check my blood sugar once a day  When to check my blood sugar before meals     Care Management & Community Referrals:  Referral 10/01/2014  Referrals made for care  management support none needed  Referrals made to community resources -

## 2015-02-08 NOTE — Assessment & Plan Note (Signed)
Refilled her Tramadol 50mg  q6hr

## 2015-02-08 NOTE — Assessment & Plan Note (Signed)
Rx Debrox bilaterally to reduce excessive cerumen

## 2015-02-08 NOTE — Progress Notes (Signed)
   Subjective:    Patient ID: Joy Butler, female    DOB: [birth year], 64 y.o.   MRN: [id number]  HPI Joy Butler is a 64 yr old woman with PMH of DM2, HTN, HLP, temporal arteritis, presenting accompanied by her niece for evaluation of diarrhea x 3weeks. She has 1-2 brown, liquid bowel movements per day with no blood with abdominal cramping. She states that eating triggers the diarrhea. She denies recent antibiotic use. She has been on metformin for a long time and does not recall having diarrhea when first starting this medication. She has been on Protonix for a long time as well with on new medications. No one at home is sick. She denies fever/chills, nausea/vomiting. She is able to tolerate intake per mouth well.  She also complains of a left breast cyst that has some white drainage 1 week ago. She denies breast tenderness but still has a small cyst.  She has increased cerumen in bilateral ears but with no decreased hearing.  Her chronic back pain is worse now and she requests refill of her tramadol.  She has a pruritic rash under her breasts.    Review of Systems  Constitutional: Negative for fever, chills, activity change, appetite change and unexpected weight change.  HENT: Negative for congestion.   Respiratory: Negative for cough and shortness of breath.   Cardiovascular: Negative for chest pain and leg swelling.  Gastrointestinal: Positive for diarrhea. Negative for nausea, vomiting and blood in stool.       Intermittent abdominal cramping   Genitourinary: Negative for dysuria, frequency and difficulty urinating.  Skin: Negative for color change.  Neurological: Negative for dizziness and light-headedness.  Psychiatric/Behavioral: Negative for agitation.       Objective:   Physical Exam  Constitutional: She is oriented to person, place, and time. She appears well-developed and well-nourished. No distress.  Sitting in wheelchair  HENT:  MMM. Excessive cerumen in bilateral  ears   Cardiovascular: Normal rate and regular rhythm.   Pulmonary/Chest: Effort normal and breath sounds normal. No respiratory distress. She has no wheezes. She has no rales.  Breast exam: She has a ~1cm cyst in the left breast at 3 o'clock with no drainage or induration. She has a scaly red rash under her bilateral breasts with no skin break  Abdominal: Soft. Bowel sounds are normal. She exhibits no distension. There is tenderness. There is no rebound and no guarding.  Tender to deep palpation in all quadrants  Musculoskeletal: She exhibits no edema or tenderness.  Neurological: She is alert and oriented to person, place, and time.  Skin: Skin is warm and dry. She is not diaphoretic.  Psychiatric: She has a normal mood and affect.  Nursing note and vitals reviewed.         Assessment & Plan:

## 2015-02-08 NOTE — Assessment & Plan Note (Signed)
Lab Results  Component Value Date   HGBA1C 8.5 02/07/2015   HGBA1C 13.4 07/26/2014   HGBA1C 13.3 05/16/2014     Assessment: Diabetes control:  controlled Progress toward A1C goal:   close to her goal Comments: much improved. She is on NPH 48 unit BID ac, metformin 1000mg  BID ac.   Plan: Medications:  continue current medications Home glucose monitoring: Frequency:   Timing:   Instruction/counseling given: discussed diet Educational resources provided: brochure, handout Self management tools provided:   Other plans: Follow up in 1-2 months

## 2015-02-08 NOTE — Assessment & Plan Note (Signed)
Rx nystatin-triamcinolone ointment

## 2015-02-08 NOTE — Assessment & Plan Note (Signed)
Her breast cyst appears mostly benign with signs of infection with no skin dimpling, nipple discharge, or erythema of the breast, and no TTP.  -Ordered US breast for further eval of cyst

## 2015-02-08 NOTE — Assessment & Plan Note (Signed)
PE was in 2013, she has stable RR with only mild tachycardia. INR subtherapeutic, she reports skipping many doses of her warfarin.  -She was encouraged to continue taking her warfarin but she may be a good candidate for the novel anticoagulation agents.

## 2015-02-08 NOTE — Assessment & Plan Note (Addendum)
Her risk of C. Diff is low with no recent Abx use. 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 Diverticulitis also possible but atypical abdominal pain/cramping pattern for this dx. She was encouraged to reduce her metformin to 581m daily and stop taking Protonix if her diarrhea persists. UA negative for UTI.  -She was not able to provide a stool sample during this visit but was given collection kit to bring stool sample for c. Diff PCR ASAP.  -STAT CMP with stable K, Cr, Na. CBC with no elevated WBC, stable Hgb.  -She was prescribed bentyl 123mTID ac (donatal not as preferred with her insurance).  -She was advised to go to the ED if her symptoms worsen and to follow up with [place] needed.  -Encouraged water hydration.

## 2015-02-11 ENCOUNTER — Encounter: Payer: Medicare Other | Admitting: Internal Medicine

## 2015-02-11 NOTE — Progress Notes (Signed)
Medicine attending: Medical history, presenting problems, physical findings, and medications, reviewed with Dr Soli Kennerly on the day of the patient visit and I concur with her evaluation and management plan. 

## 2015-02-26 ENCOUNTER — Other Ambulatory Visit: Payer: Self-pay | Admitting: Internal Medicine

## 2015-03-11 ENCOUNTER — Ambulatory Visit: Payer: Medicare Other

## 2015-03-19 ENCOUNTER — Emergency Department (HOSPITAL_COMMUNITY): Payer: Medicare Other

## 2015-03-19 ENCOUNTER — Encounter (HOSPITAL_COMMUNITY): Payer: Self-pay | Admitting: Emergency Medicine

## 2015-03-19 ENCOUNTER — Emergency Department (HOSPITAL_COMMUNITY)
Admission: EM | Admit: 2015-03-19 | Discharge: 2015-03-19 | Disposition: A | Discharge status: Home / Self Care | Payer: Medicare Other | Attending: Emergency Medicine | Admitting: Emergency Medicine

## 2015-03-19 DIAGNOSIS — Z86711 Personal history of pulmonary embolism: Secondary | ICD-10-CM | POA: Diagnosis not present

## 2015-03-19 DIAGNOSIS — I10 Essential (primary) hypertension: Secondary | ICD-10-CM | POA: Diagnosis not present

## 2015-03-19 DIAGNOSIS — Z794 Long term (current) use of insulin: Secondary | ICD-10-CM | POA: Diagnosis not present

## 2015-03-19 DIAGNOSIS — Y9389 Activity, other specified: Secondary | ICD-10-CM | POA: Insufficient documentation

## 2015-03-19 DIAGNOSIS — S8992XA Unspecified injury of left lower leg, initial encounter: Secondary | ICD-10-CM | POA: Insufficient documentation

## 2015-03-19 DIAGNOSIS — G43909 Migraine, unspecified, not intractable, without status migrainosus: Secondary | ICD-10-CM | POA: Insufficient documentation

## 2015-03-19 DIAGNOSIS — E119 Type 2 diabetes mellitus without complications: Secondary | ICD-10-CM | POA: Diagnosis not present

## 2015-03-19 DIAGNOSIS — M7989 Other specified soft tissue disorders: Secondary | ICD-10-CM

## 2015-03-19 DIAGNOSIS — Z7901 Long term (current) use of anticoagulants: Secondary | ICD-10-CM | POA: Diagnosis not present

## 2015-03-19 DIAGNOSIS — W07XXXA Fall from chair, initial encounter: Secondary | ICD-10-CM | POA: Diagnosis not present

## 2015-03-19 DIAGNOSIS — Z720 Tobacco use: Secondary | ICD-10-CM | POA: Insufficient documentation

## 2015-03-19 DIAGNOSIS — G8929 Other chronic pain: Secondary | ICD-10-CM | POA: Insufficient documentation

## 2015-03-19 DIAGNOSIS — Y998 Other external cause status: Secondary | ICD-10-CM | POA: Diagnosis not present

## 2015-03-19 DIAGNOSIS — Z91040 Latex allergy status: Secondary | ICD-10-CM | POA: Insufficient documentation

## 2015-03-19 DIAGNOSIS — Z87442 Personal history of urinary calculi: Secondary | ICD-10-CM | POA: Diagnosis not present

## 2015-03-19 DIAGNOSIS — Z87440 Personal history of urinary (tract) infections: Secondary | ICD-10-CM | POA: Insufficient documentation

## 2015-03-19 DIAGNOSIS — Z8701 Personal history of pneumonia (recurrent): Secondary | ICD-10-CM | POA: Insufficient documentation

## 2015-03-19 DIAGNOSIS — Z79899 Other long term (current) drug therapy: Secondary | ICD-10-CM | POA: Diagnosis not present

## 2015-03-19 DIAGNOSIS — E780 Pure hypercholesterolemia: Secondary | ICD-10-CM | POA: Diagnosis not present

## 2015-03-19 DIAGNOSIS — M1990 Unspecified osteoarthritis, unspecified site: Secondary | ICD-10-CM | POA: Insufficient documentation

## 2015-03-19 DIAGNOSIS — S8991XA Unspecified injury of right lower leg, initial encounter: Secondary | ICD-10-CM | POA: Diagnosis not present

## 2015-03-19 DIAGNOSIS — S79911A Unspecified injury of right hip, initial encounter: Secondary | ICD-10-CM | POA: Diagnosis not present

## 2015-03-19 DIAGNOSIS — Z791 Long term (current) use of non-steroidal anti-inflammatories (NSAID): Secondary | ICD-10-CM | POA: Diagnosis not present

## 2015-03-19 DIAGNOSIS — Y92000 Kitchen of unspecified non-institutional (private) residence as  the place of occurrence of the external cause: Secondary | ICD-10-CM | POA: Insufficient documentation

## 2015-03-19 DIAGNOSIS — G629 Polyneuropathy, unspecified: Secondary | ICD-10-CM | POA: Insufficient documentation

## 2015-03-19 DIAGNOSIS — M25469 Effusion, unspecified knee: Secondary | ICD-10-CM

## 2015-03-19 DIAGNOSIS — W19XXXA Unspecified fall, initial encounter: Secondary | ICD-10-CM

## 2015-03-19 DIAGNOSIS — F329 Major depressive disorder, single episode, unspecified: Secondary | ICD-10-CM | POA: Diagnosis not present

## 2015-03-19 MED ORDER — ONDANSETRON 4 MG PO TBDP
4.0000 mg | ORAL_TABLET | Freq: Once | ORAL | Status: AC
  Administered 2015-03-19: 4 mg via ORAL
  Filled 2015-03-19: qty 1

## 2015-03-19 MED ORDER — HYDROCODONE-ACETAMINOPHEN 5-325 MG PO TABS
1.0000 | ORAL_TABLET | Freq: Once | ORAL | Status: AC
  Administered 2015-03-19: 1 via ORAL
  Filled 2015-03-19: qty 1

## 2015-03-19 MED ORDER — ACETAMINOPHEN 325 MG PO TABS: 325.0000 mg | ORAL_TABLET | Freq: Once | ORAL | Status: DC

## 2015-03-19 MED ORDER — AMLODIPINE BESYLATE 10 MG PO TABS
10.0000 mg | ORAL_TABLET | Freq: Every day | ORAL | Status: DC
  Administered 2015-03-19: 10 mg via ORAL
  Filled 2015-03-19: qty 1

## 2015-03-19 NOTE — ED Provider Notes (Signed)
CSN: [id number]     Arrival date & time 03/19/15  1548 History   First MD Initiated Contact with Patient 03/19/15 1811     Chief Complaint  Patient presents with  . Fall  . Arm Pain  . Leg Pain     (Consider location/radiation/quality/duration/timing/severity/associated sxs/prior Treatment) HPI    PCP: Jessee Avers, MD Blood pressure 189/103, pulse 95, temperature 97.8 F (36.6 C), temperature source Oral, resp. rate 18, SpO2 95 %.  Joy Butler is a 64 y.o.female with a significant PMH of hypertension, PE, UTI, hypercholesterolemia, depression, kidney stones, diabetes, migraines, arthritis presents to the ER with complaints of fall just prior to arrival. She was sitting in her kitchen chair which is wooden when it broke and she fell straight onto her laminate flooring. She denies hitting her head, neck or loc. She is complaining of right hip pain, right and left knee pain. She has chronic knee pains.  Negative Review of Symptoms:  No headache, nausea, vomiting, diarrhea, abdominal pains, back pains, or chest pain. No SOB, diaphoresis, confusion, generalized or focal weakness.   Past Medical History  Diagnosis Date  . Hypertension   . Pulmonary embolism     2011 treated at [facility] in [place].  Was on Coumadin for over a year.  no known family history  . UTI (urinary tract infection)   . High cholesterol   . 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 Depression   . Neuropathy     feet  . Kidney stones   . Pneumonia     "several times" (07/26/2014)  . Type II diabetes mellitus dx'd 2000  . Daily headache   . Migraine     "last one was in the 1990's" (07/26/2014)  . Arthritis     "knees" (07/26/2014)  . Chronic back pain    Past Surgical History  Procedure Laterality Date  . Cholecystectomy      1980  . Esophagogastroduodenoscopy  08/19/2012    Procedure: ESOPHAGOGASTRODUODENOSCOPY (EGD);  Surgeon: Winfield Cunas., MD;  Location: [facility] ENDOSCOPY;  Service:  Endoscopy;  Laterality: N/A;  . Cesarean section  1982  . Carpal tunnel release Bilateral   . Tonsillectomy    . Artery biopsy Right 05/09/2014    Procedure: BIOPSY TEMPORAL ARTERY;  Surgeon: Rosetta Posner, MD;  Location: [facility];  Service: Vascular;  Laterality: Right;  . Appendectomy    . Vaginal hysterectomy    . Dilation and curettage of uterus  1982  . Cystoscopy w/ stone manipulation    . Breast biopsy Left   . Temporal artery biopsy / ligation Right 04/2014  . Eye surgery Bilateral   . Refractive surgery Bilateral    Family History  Problem Relation Age of Onset  . Adopted: Yes  . Hypertension Mother    History  Substance Use Topics  . Smoking status: Current Every Day Smoker -- 0.50 packs/day for 44 years    Types: Cigarettes  . Smokeless tobacco: Never Used     Comment: 1/2 PPD  . Alcohol Use: No   OB History    No data available     Review of Systems  10 Systems reviewed and are negative for acute change except as noted in the HPI.   Allergies  Keflex; Clindamycin/lincomycin; Latex; Sulfa antibiotics; and Sulfur  Home Medications   Prior to Admission medications   Medication Sig Start Date End Date Taking? Authorizing Provider  acetaminophen (TYLENOL) 500  MG tablet Take 1,000 mg by mouth every 6 (six) hours as needed for mild pain, moderate pain or fever.   Yes Historical Provider, MD  amitriptyline (ELAVIL) 25 MG tablet TAKE 2 TABLETS BY MOUTH EVERY NIGHT AT BEDTIME 12/05/14  Yes Jessee Avers, MD  amLODipine (NORVASC) 10 MG tablet Take 1 tablet (10 mg total) by mouth daily.   Yes Jessee Avers, MD  carbamide peroxide (DEBROX) 6.5 % otic solution Place 5 drops into both ears 2 (two) times daily. 02/07/15  Yes Blain Pais, MD  citalopram (CELEXA) 20 MG tablet TAKE 1 TABLET BY MOUTH DAILY 03/04/15  Yes Madilyn Fireman, MD  diclofenac sodium (VOLTAREN) 1 % GEL Apply 4 g topically 4 (four) times daily. 11/10/14  Yes Ejiroghene Arlyce Dice, MD  dicyclomine  (BENTYL) 10 MG capsule Take 1 capsule (10 mg total) by mouth 4 (four) times daily -  before meals and at bedtime. 02/07/15  Yes Blain Pais, MD  EASY TOUCH INSULIN SYRINGE 31G X 5/16" 1 ML MISC USE AS DIRECTED TWICE DAILY 03/04/15  Yes Madilyn Fireman, MD  gabapentin (NEURONTIN) 300 MG capsule TAKE 1 CAPSULE BY MOUTH THREE TIMES A DAY 10/31/14  Yes Jessee Avers, MD  insulin NPH-regular Human (NOVOLIN 70/30) (70-30) 100 UNIT/ML injection Inject 48 Units into the skin 2 (two) times daily with a meal. Patient taking differently: Inject 45 Units into the skin 2 (two) times daily with a meal.  10/01/14  Yes Jessee Avers, MD  losartan-hydrochlorothiazide (HYZAAR) 100-25 MG per tablet TAKE 1 TABLET BY MOUTH EVERY DAY 03/04/15  Yes Madilyn Fireman, MD  metFORMIN (GLUCOPHAGE) 1000 MG tablet Take 1 tablet (1,000 mg total) by mouth 2 (two) times daily with a meal. 10/05/14  Yes Jessee Avers, MD  NICODERM CQ 14 MG/24HR patch PLACE 1 [place] 12/31/14  Yes Jessee Avers, MD  nystatin-triamcinolone ointment [facility]) Apply 1 application topically 2 (two) times daily. 02/07/15  Yes Blain Pais, MD  pravastatin (PRAVACHOL) 40 MG tablet TAKE 1 TABLET BY MOUTH EVERY EVENING 01/30/15  Yes Jessee Avers, MD  SANTYL ointment APPLY TO AFFECTED AREA EVERY DAY 01/03/15  Yes Jessee Avers, MD  warfarin (COUMADIN) 5 MG tablet Take 5-17.5 mg by mouth daily. Take 12.5 mg (2.5 tablets) on Sun, Tues, Thurs, Sat, Take 15 mg (3 tablets) on Wed and Fri, Take 17.5 mg (3.5 tablets) on Mon.   Yes Historical Provider, MD  nicotine (NICODERM CQ - DOSED IN MG/24 HOURS) 14 mg/24hr patch Place 1 patch (14 mg total) onto the skin daily. Patient not taking: Reported on 11/21/2014 10/01/14   Jessee Avers, MD  omeprazole (PRILOSEC) 20 MG capsule Take 1 capsule (20 mg total) by mouth daily. 05/23/14   Jessee Avers, MD  warfarin (COUMADIN) 5 MG tablet TAKE 2.5 TABLETS (12.5 MG TOTAL) BY MOUTH ONE TIME ONLY  AT 6 PM Patient not taking: Reported on 03/19/2015 01/30/15   Jessee Avers, MD   BP 183/100 mmHg  Pulse 89  Temp(Src) 97.8 F (36.6 C) (Oral)  Resp 18  SpO2 98% Physical Exam  Constitutional: She is oriented to person, place, and time. She appears well-developed and well-nourished. No distress.  HENT:  Head: Normocephalic and atraumatic. Head is without raccoon's eyes, without Battle's sign, without abrasion, without contusion, without laceration, without right periorbital erythema and without left periorbital erythema.  Right Ear: No hemotympanum.  Left Ear: No hemotympanum.  Nose: Nose normal.  Mouth/Throat: Oropharynx is clear and moist.  Eyes: Conjunctivae and  EOM are normal. Pupils are equal, round, and reactive to light.  Neck: Normal range of motion. Neck supple. No spinous process tenderness and no muscular tenderness present.  Cardiovascular: Normal rate and regular rhythm.   Pulmonary/Chest: Effort normal. No respiratory distress. She has no decreased breath sounds. She exhibits no tenderness, no bony tenderness, no crepitus and no retraction.  No chest wall tenderness or ecchymosis  Abdominal: Soft. Bowel sounds are normal. There is no tenderness. There is no guarding.  No abdominal wall tenderness  Musculoskeletal:  Pt has full ROM of bilateral legs and hips. She reports pain to bilateral knees and right hip with movement and at rest.   Swelling to left leg.   Neurological: She is alert and oriented to person, place, and time. She has normal strength.  Skin: Skin is warm and dry. She is not diaphoretic.  Psychiatric: Her speech is normal.  Nursing note and vitals reviewed.   ED Course  Procedures (including critical care time) Labs Review Labs Reviewed - No data to display  Imaging Review Dg Knee Complete 4 Views Left  03/19/2015   CLINICAL DATA:  Right knee pain secondary to a fall when her chair collapsed at home today.  EXAM: LEFT KNEE - COMPLETE 4+ VIEW   COMPARISON:  07/17/2014  FINDINGS: There is no fracture or dislocation. 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 Small joint effusion.  IMPRESSION: Small joint effusion.  No acute osseous abnormality.  Arthritis.   Electronically Signed   By: Lorriane Shire M.D.   On: 03/19/2015 20:17   Dg Knee Complete 4 Views Right  03/19/2015   CLINICAL DATA:  Right knee pain secondary to a fall when her chair collapsed at home today.  EXAM: RIGHT KNEE - COMPLETE 4+ VIEW  COMPARISON:  None.  FINDINGS: There is no fracture or dislocation. There is tricompartmental osteoarthritis with multiple loose bodies in the posterior aspect of the joint. There is a joint effusion. There is soft tissue swelling over the distal quadriceps tendon.  IMPRESSION: Soft tissue swelling at the distal quadriceps tendon. Small joint effusion. No acute osseous abnormality.   Electronically Signed   By: Lorriane Shire M.D.   On: 03/19/2015 20:19   Dg Hip Unilat With Pelvis 2-3 Views Right  03/19/2015   CLINICAL DATA:  Right hip pain secondary to a fall when her chair collapsed at home today.  EXAM: RIGHT HIP (WITH PELVIS) 2-3 VIEWS  COMPARISON:  Radiographs dated 07/17/2014  FINDINGS: There is no fracture, dislocation, or other significant abnormality.  IMPRESSION: No significant abnormality.   Electronically Signed   By: Lorriane Shire M.D.   On: 03/19/2015 20:22     EKG Interpretation None      MDM   Final diagnoses:  Fall  Knee effusion, unspecified laterality  Left leg swelling    Patient has ambulated within the emergency department. Dr. Sabra Heck has seen the patient as well and agrees that she is safe for discharge. PT/INR was ordered but per lab it hemolyzed. Pt wants to go home was given Vicodin for pain control. xrays are unremarkable for fracture. She has left leg swelling, will have her gone to [facility] tomorrow morning at 9 am for dopplers to be done.  64 y.o.Joy Butler's evaluation in the Emergency Department is  complete. It has been determined that no acute conditions requiring further emergency intervention are present at this time. The patient/guardian have been advised of the diagnosis and plan. We have discussed signs and symptoms that  warrant return to the ED, such as changes or worsening in symptoms.  Vital signs are stable at discharge. Filed Vitals:   03/19/15 2019  BP: 183/100  Pulse: 89  Temp:   Resp: 18    Patient/guardian has voiced understanding and agreed to follow-up with the PCP or specialist.    Delos Haring, PA-C 03/19/15 2050  Noemi Chapel, MD 03/19/15 2337

## 2015-03-19 NOTE — ED Notes (Signed)
Patient transported to X-ray 

## 2015-03-19 NOTE — ED Notes (Signed)
Pt ambulated to restroom slowly but w/o assistance. Pt sitting up in bed now drinking a coke.

## 2015-03-19 NOTE — Discharge Instructions (Signed)

## 2015-03-19 NOTE — ED Notes (Signed)
Per EMS: Pt states that she was sitting in a chair when it broke underneath her.  Pt states that she is now having bilateral arm pain, bilateral leg pain, and bilateral low abd pain.  Describes all of this pain as an aching pain.  No LOC.  No neck or back pain.  SCCA clear.

## 2015-03-19 NOTE — ED Notes (Signed)
Pt called x 1 for vitals in triage. No answer in lobby.

## 2015-03-21 ENCOUNTER — Other Ambulatory Visit: Payer: Self-pay

## 2015-03-21 ENCOUNTER — Telehealth: Payer: Self-pay | Admitting: Internal Medicine

## 2015-03-21 ENCOUNTER — Other Ambulatory Visit: Payer: Self-pay | Admitting: Internal Medicine

## 2015-03-21 DIAGNOSIS — Z1231 Encounter for screening mammogram for malignant neoplasm of breast: Secondary | ICD-10-CM

## 2015-03-21 NOTE — Telephone Encounter (Signed)
  Reason for call:   I placed an outgoing call to [name] at 7:45 PM regarding leg pain. She was recently seen in the emergency department on 3/29 after sustaining fall when her kitchen chair gave way. Imaging at that time showed no acute fracture of her knee or hip. She was given Vicodin in the emergency department and told to follow-up the following day, 3/30, for lower extremities Dopplers to rule out DVT. Unfortunately, she reports she was not able to make it to this follow-up study. She is also told that she'll be called by the outpatient clinic for follow-up appointment and reports that she has not.   She reports that she has leg swelling it was initially left greater than right though was swollen prior to her coming into the emergency department. She otherwise denies any bowel/bladder dysfunction or worsening respiratory status. She reports taking Tylenol though has not provided any relief to her pain.    Assessment/ Plan:   I explained to her that she should try taking Motrin but no more than 600-800 mg every 6-8 hours until she can be seen in clinic tomorrow. I also explained to her that I cannot prescribe narcotic medication over the phone to which he acknowledges understanding.   I will go and route a message to the triage desk to make sure that she is able to come in for follow-up appointment.   As always, pt is advised that if symptoms worsen or new symptoms arise, they should go to an urgent care facility or to to ER for further evaluation.   Joy Dubin, MD   03/21/2015, 8:13 PM

## 2015-03-22 ENCOUNTER — Encounter: Payer: Self-pay | Admitting: Internal Medicine

## 2015-03-22 ENCOUNTER — Ambulatory Visit: Payer: Medicare (Managed Care) | Admitting: Internal Medicine

## 2015-03-22 NOTE — Telephone Encounter (Signed)
appt scheduled for 4/1, pt will call by 1200 if unable to find transportation.  Triage called her 3/31 and lm for rtc

## 2015-03-25 ENCOUNTER — Ambulatory Visit: Payer: Medicare (Managed Care)

## 2015-03-26 ENCOUNTER — Telehealth: Payer: Self-pay | Admitting: Pharmacist

## 2015-03-26 ENCOUNTER — Other Ambulatory Visit: Payer: Self-pay | Admitting: Internal Medicine

## 2015-03-26 NOTE — Telephone Encounter (Signed)
Call to patient to confirm appointment for 03/27/15 at 2:00 lmtcb

## 2015-03-27 ENCOUNTER — Telehealth: Payer: Self-pay | Admitting: Internal Medicine

## 2015-03-27 ENCOUNTER — Ambulatory Visit (INDEPENDENT_AMBULATORY_CARE_PROVIDER_SITE_OTHER): Payer: Medicare Other | Admitting: Pharmacist

## 2015-03-27 DIAGNOSIS — I2699 Other pulmonary embolism without acute cor pulmonale: Secondary | ICD-10-CM | POA: Diagnosis not present

## 2015-03-27 DIAGNOSIS — M174 Other bilateral secondary osteoarthritis of knee: Secondary | ICD-10-CM | POA: Insufficient documentation

## 2015-03-27 DIAGNOSIS — Z7901 Long term (current) use of anticoagulants: Secondary | ICD-10-CM

## 2015-03-27 LAB — POCT INR: INR: 1

## 2015-03-27 MED ORDER — ACETAMINOPHEN-CODEINE #3 300-30 MG PO TABS: 1.0000 | ORAL_TABLET | Freq: Four times a day (QID) | ORAL | Status: DC | PRN

## 2015-03-27 NOTE — Telephone Encounter (Signed)
Rx called in 

## 2015-03-27 NOTE — Telephone Encounter (Signed)
Made a phone call to the patient. She had been at the clinic earlier and requested a walk-in appointment, but this could not be done. She complains of bilateral knee pain with significant difficulties with ambulation. I reviewed her recent imaging revealing bilateral knee osteoarthritis. She inquires about knee injections. In the meantime, we have discussed about different treatment options including Tylenol, which she says has not helped in the past. NSAIDs not recommended given her renal insufficiency. I have recommended trial of Tylenol #3 in the interim before her next visit with me on 04/08/2015. I encouraged her that she can call the clinic tomorrow morning and schedule an appointment for consideration of steroid knee injections. If the Tylenol #3 helps her pain, then I can see her during her next visit on 04/08/2015 and fully evaluate her pain. We can also consider [facility] or Ortho referral. She verbalized understanding and she would like to try Tylenol #3 first. I have been hestant to start her on any form of narcotic. I will have the front desk call in the Tylenol #3 this evening.

## 2015-03-27 NOTE — Patient Instructions (Signed)
Patient educated about medication as defined in this encounter and verbalized understanding by repeating back instructions provided.   

## 2015-03-27 NOTE — Progress Notes (Addendum)
CLINICAL PHARMACY NOTE  Joy Butler is a 64 y.o. female who reports to the clinic for anticoagulation management.  ASSESSMENT Indication(s): recurrent PE Duration: indefinite   Anticoagulation Clinic Visit History: Anticoagulation Episode Summary    Current INR goal 2.0-3.0  Next INR check 04/29/2015  INR from last check 1.0! 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 Duration of therapy should be indefinite but with periodic assessment and advice and consent of the patient insofar as continuing indefiite warfarin therapy as per guidelines published in supplement to Journal CHEST.       Lab Results  Component Value Date   INR 1.0 03/27/2015   INR 1.0 02/07/2015   INR 1.1 10/02/2014   INR today: Subtherapeutic  Component Value Date/Time   AST 15 02/07/2015 1404   ALT 9 02/07/2015 1404   NA 138 02/07/2015 1404   K 3.6 02/07/2015 1404   CL 102 02/07/2015 1404   CO2 26 02/07/2015 1404   GLUCOSE 249* 02/07/2015 1404   HGBA1C 8.5 02/07/2015 1350   HGBA1C 10.4* 08/31/2012 2207   BUN 11 02/07/2015 1404   CREATININE 1.42* 02/07/2015 1404   CREATININE 1.37* 07/31/2014 1538   CALCIUM 9.0 02/07/2015 1404    GFRAA 44* 02/07/2015 1404   GFRAA 47* 07/31/2014 1538   WBC 9.2 02/07/2015 1404   HGB 14.3 02/07/2015 1404   HCT 44.2 02/07/2015 1404   PLT 390 02/07/2015 1404   Anticoagulation Dosing: INR as of 03/27/2015 and Previous Dosing Information    INR Dt INR Goal [employer] Sun Mon Tue Wed Thu Fri Sat   03/27/2015 1.0 2.0-3.0 97.5 mg 12.5 mg 17.5 mg 12.5 mg 15 mg 12.5 mg 15 mg 12.5 mg    Anticoagulation Dose Instructions as of 03/27/2015      Total Sun Mon Tue Wed  Thu Fri Sat   New Dose 0 mg 0 mg 0 mg 0 mg 0 mg 0 mg 0 mg 0 mg     -  -  -  -  -  -  -                         Description        Warfarin discontinued, patient switched to Xarelto 20 mg daily     Adherence: Patient reports no adherence challenges.  Safety: Patient reports no recent signs or symptoms of bleeding, no signs of symptoms of thrombosis.   Recommendations:  Medication: Prior PCP review and approval, warfarin was discontinued and xarelto 20 mg daily was initiated.   Monitoring: European Society of Cardiology [facility]) Guidelines suggest consideration of initial 1 month follow up after transition from warfarin to Xarelto, including adherence, thromboembolic or bleeding events, other side effects, co-medications, and potential need for blood sampling (hemoglobin, renal/hepatic function). Thereafter, consideration of these same factors should be made 3 months, then 6 months after transition if possible (no American guidelines currently available).  PLAN Implement changes as recommended above. Patient agreed to obtain initial supply of Xarelto at [facility] to hold her over until coordination of delivery from [employer].  Patient Instructions: Patient Instructions  Patient  educated about medication as defined in this encounter and verbalized understanding by repeating back instructions provided.   Follow-up Return in about 5 weeks (around 04/29/2015) for Follow up anticoag clinic 04/29/15 2pm.  Flossie Dibble, PharmD BCPS, BCACP

## 2015-03-27 NOTE — Addendum Note (Signed)
Addended by: Forde Dandy on: 03/27/2015 08:50 PM   Modules accepted: Medications

## 2015-03-28 MED ORDER — RIVAROXABAN 20 MG PO TABS: 20.0000 mg | ORAL_TABLET | Freq: Every day | ORAL | Status: DC

## 2015-03-28 NOTE — Addendum Note (Signed)
Addended by: Forde Dandy on: 03/28/2015 05:29 PM   Modules accepted: Orders

## 2015-03-28 NOTE — Progress Notes (Signed)
I have reviewed Dr. Julianne Rice note.  Patient to be transitioned to Xarelto.  He is on anticoagulation for history of recurrent PEs.

## 2015-04-02 ENCOUNTER — Ambulatory Visit: Payer: Medicare (Managed Care) | Admitting: Internal Medicine

## 2015-04-08 ENCOUNTER — Ambulatory Visit (INDEPENDENT_AMBULATORY_CARE_PROVIDER_SITE_OTHER): Payer: Medicare Other | Admitting: Internal Medicine

## 2015-04-08 ENCOUNTER — Encounter: Payer: Self-pay | Admitting: Internal Medicine

## 2015-04-08 VITALS — BP 144/78 | HR 94 | Temp 98.2°F | Ht 69.0 in | Wt 246.3 lb

## 2015-04-08 DIAGNOSIS — M549 Dorsalgia, unspecified: Secondary | ICD-10-CM | POA: Diagnosis not present

## 2015-04-08 DIAGNOSIS — E119 Type 2 diabetes mellitus without complications: Secondary | ICD-10-CM | POA: Diagnosis not present

## 2015-04-08 DIAGNOSIS — R600 Localized edema: Secondary | ICD-10-CM

## 2015-04-08 DIAGNOSIS — Z0000 Encounter for general adult medical examination without abnormal findings: Secondary | ICD-10-CM

## 2015-04-08 DIAGNOSIS — M7989 Other specified soft tissue disorders: Secondary | ICD-10-CM | POA: Insufficient documentation

## 2015-04-08 DIAGNOSIS — G8929 Other chronic pain: Secondary | ICD-10-CM

## 2015-04-08 DIAGNOSIS — E7800 Pure hypercholesterolemia, unspecified: Secondary | ICD-10-CM

## 2015-04-08 DIAGNOSIS — F32A Depression, unspecified: Secondary | ICD-10-CM

## 2015-04-08 DIAGNOSIS — Z794 Long term (current) use of insulin: Secondary | ICD-10-CM | POA: Diagnosis not present

## 2015-04-08 DIAGNOSIS — Z79899 Other long term (current) drug therapy: Secondary | ICD-10-CM

## 2015-04-08 DIAGNOSIS — E118 Type 2 diabetes mellitus with unspecified complications: Secondary | ICD-10-CM

## 2015-04-08 DIAGNOSIS — F329 Major depressive disorder, single episode, unspecified: Secondary | ICD-10-CM

## 2015-04-08 DIAGNOSIS — I10 Essential (primary) hypertension: Secondary | ICD-10-CM

## 2015-04-08 DIAGNOSIS — M174 Other bilateral secondary osteoarthritis of knee: Secondary | ICD-10-CM

## 2015-04-08 DIAGNOSIS — Z7901 Long term (current) use of anticoagulants: Secondary | ICD-10-CM

## 2015-04-08 DIAGNOSIS — G4700 Insomnia, unspecified: Secondary | ICD-10-CM

## 2015-04-08 MED ORDER — METFORMIN HCL 1000 MG PO TABS: 1000.0000 mg | ORAL_TABLET | Freq: Two times a day (BID) | ORAL | Status: DC

## 2015-04-08 MED ORDER — INSULIN NPH ISOPHANE & REGULAR (70-30) 100 UNIT/ML ~~LOC~~ SUSP: 55.0000 [IU] | Freq: Two times a day (BID) | SUBCUTANEOUS | Status: DC

## 2015-04-08 MED ORDER — ACETAMINOPHEN-CODEINE #3 300-30 MG PO TABS: 1.0000 | ORAL_TABLET | Freq: Four times a day (QID) | ORAL | Status: DC | PRN

## 2015-04-08 MED ORDER — TRAZODONE HCL 150 MG PO TABS: 150.0000 mg | ORAL_TABLET | Freq: Every day | ORAL | Status: DC

## 2015-04-08 MED ORDER — LOSARTAN POTASSIUM-HCTZ 100-25 MG PO TABS: 1.0000 | ORAL_TABLET | Freq: Every day | ORAL | Status: DC

## 2015-04-08 NOTE — Assessment & Plan Note (Addendum)
Patient has established OA of the knee bilaterally. Pain is better with Tylenol #3.  Plan  - continue with Tylenol#3 for now. - deferred planned steroid injection due to Xarelto - referred to [facility] - will consider referral to Ortho is needed.

## 2015-04-08 NOTE — Assessment & Plan Note (Signed)
BP Readings from Last 3 Encounters:  04/08/15 144/78  03/19/15 183/100  02/07/15 153/91    Lab Results  Component Value Date   NA 138 02/07/2015   K 3.6 02/07/2015   CREATININE 1.42* 02/07/2015    Assessment: Blood pressure control: controlled Progress toward BP goal:  at goal Comments: well controlled on Hyzaar 100-25 mg qday and Amlodipine 10 mg daily.   Plan: Medications:  continue current medications Educational resources provided: brochure (denies) Self management tools provided:   Other plans: routine follow up

## 2015-04-08 NOTE — Assessment & Plan Note (Signed)
She reports that her symptoms are well controlled on Celexa 20 mg daily. In addition, she also takes Elavil 25 mg nightly for insomnia. However, she states that this has not helped her sleep problems. Plan -I will switch her Celexa 20 mg (and Elavil) to Trazodone 150 mg nightly -Trazodone will help with sleep as well. -Discussed with pharmacist and the patient about tapering Elavil. - follow up with pharmacy in 2 weeks and with MD in 1 month.

## 2015-04-08 NOTE — Patient Instructions (Signed)
General Instructions: Please stop taking Amitriptylline and Celexa Please start taking Trazodone 150 mg daily at bed Please take your Metformin twice a day  I will refer you to sports medicine for your knee pain Please come back in 1 month.  Please bring your medicines with you each time you come to clinic.  Medicines may include prescription medications, over-the-counter medications, herbal remedies, eye drops, vitamins, or other pills.   Progress Toward Treatment Goals:  Treatment Goal 04/08/2015  Hemoglobin A1C unchanged  Blood pressure at goal  Stop smoking smoking less  Prevent falls improved    Self Care Goals & Plans:  Self Care Goal 04/08/2015  Manage my medications take my medicines as prescribed; bring my medications to every visit; refill my medications on time  Monitor my health -  Eat healthy foods drink diet soda or water instead of juice or soda; eat more vegetables; eat foods that are low in salt; eat baked foods instead of fried foods; eat fruit for snacks and desserts  Be physically active -  Stop smoking -  Prevent falls -  Meeting treatment goals -    Home Blood Glucose Monitoring 04/08/2015  Check my blood sugar 3 times a day  When to check my blood sugar before breakfast; before lunch; before dinner     Care Management & Community Referrals:  Referral 04/08/2015  Referrals made for care management support none needed  Referrals made to community resources -

## 2015-04-08 NOTE — Assessment & Plan Note (Signed)
She was switched Xarelto from VTA. She is compliant.  She follow up with pharmacy clinic for dose adjustment. She will see them in 2 weeks.

## 2015-04-08 NOTE — Assessment & Plan Note (Signed)
Pain is better with Tylenol #3 1-2 pills every 6 hours prn. I will refill this.

## 2015-04-08 NOTE — Assessment & Plan Note (Signed)
Urine microalbumin to creatinine ration  She decline any shots

## 2015-04-08 NOTE — Assessment & Plan Note (Signed)
Lab Results  Component Value Date   HGBA1C 8.5 02/07/2015   HGBA1C 13.4 07/26/2014   HGBA1C 13.3 05/16/2014     Assessment: Diabetes control: fair control Progress toward A1C goal:  unchanged Comments: Patient does not have her glucose meter today. She reports that her blood sugars ran within the ranges of 140s to 150s. She has lost 6 pounds over the last 4 weeks. There is some confusion about her current dose of Insulin NPH 70/30 as the patient reports that she takes 55 units twice a day but in her chart, it is indicated as 48 units twice a day. She denies any hypoglycemia. She also stated that she takes Metformin 1000 mg once daily as opposed to twice a day as prescribed. She is off steroids for temporal arteritis. End date around mid-feb.   Plan: Medications:  Continue with insulin NPH 70/30 55 units bid and Metformin 1000mg  bid.  Home glucose monitoring: Frequency: 3 times a day Timing: before breakfast, before lunch, before dinner Instruction/counseling given: reminded to bring blood glucose meter & log to each visit, discussed foot care, discussed the need for weight loss and discussed diet Educational resources provided: brochure (denies) Self management tools provided:   Other plans: She has just seen a podiatrist today.  I have corrected her on her Metformin dose  I encouraged her to come back in 1 month

## 2015-04-08 NOTE — Assessment & Plan Note (Signed)
Elavil is not helpful at this time.  Plan  - will switch to Trazodone 150 mg daily  - taper Elavil off per pharmacy

## 2015-04-08 NOTE — Assessment & Plan Note (Signed)
Lipid Panel     Component Value Date/Time   CHOL 276* 07/31/2014 1538   TRIG 208* 07/31/2014 1538   HDL 72 07/31/2014 1538   CHOLHDL 3.8 07/31/2014 1538   VLDL 42* 07/31/2014 1538   LDLCALC 162* 07/31/2014 1538   She takes Pravastatin 40 mg daily. Goal LDL <100.   Plan  - will check lipid panel and consider switching to Lipitor.

## 2015-04-08 NOTE — Assessment & Plan Note (Signed)
Some mild edema on exam. Some of this is probably due to Amlodipine.  Plan  Compression stockings per patient request.

## 2015-04-08 NOTE — Progress Notes (Signed)
Patient ID: Joy Butler, female   DOB: 02-18-1951, 64 y.o.   MRN: [id number]   Subjective:   HPI: [name] is a 64 y.o. woman with past medical history below presents for routine clinic follow-up visit.  No new complaints today.  Kindly see the A&P for the status of the pt's chronic medical problems.   Past Medical History  Diagnosis Date  . Hypertension   . Pulmonary embolism     2011 treated at [facility] in [place].  Was on Coumadin for over a year.  no known family history  . UTI (urinary tract infection)   . High cholesterol   . 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 Depression   . Neuropathy     feet  . Kidney stones   . Pneumonia     "several times" (07/26/2014)  . Type II diabetes mellitus dx'd 2000  . Daily headache   . Migraine     "last one was in the 1990's" (07/26/2014)  . Arthritis     "knees" (07/26/2014)  . Chronic back pain    ROS: Constitutional: Denies fever, chills, diaphoresis, appetite change and fatigue. She has noticed improvement in her wt from 252lb to 246lb since being off steroids.  Respiratory: Denies SOB, DOE, cough, chest tightness, and wheezing. Denies chest pain. CVS: No chest pain, palpitations and leg swelling.  GI: No abdominal pain, nausea, vomiting, bloody stools GU: No dysuria, frequency, hematuria, or flank pain.  MSK: Bilateral knee pains due to osteoarthritis. She is hoping I can give steroid injections in her knees today. However, her pain is somewhat improved on Tylenol #3, which I prescribed recently. Her back pain has also improved with this pain medicine. She request for compression stocking for her legs due to swelling. Psych: She reports good control of her depression symptoms with Celexa 20 mg daily. She reports insomnia and this has not improved with the current Elavil 25 mg daily. No SI or SA.    Objective:  Physical Exam: Filed Vitals:   04/08/15 1319  BP: 144/78  Pulse: 94  Temp: 98.2 F (36.8 C)   TempSrc: Oral  Height: 5\' 9"  (1.753 m)  Weight: 246 lb 4.8 oz (111.721 kg)  SpO2: 100%   General: Well nourished. No acute distress.  HEENT: Normal oral mucosa. MMM.  Lungs: CTA bilaterally. Heart: RRR; no extra sounds or murmurs  Abdomen: Non-distended, normal bowel sounds, soft, nontender; no hepatosplenomegaly  Extremities: bilateral mild-mod tenderness of the knees. No evidence of fluid accumulation. No pedal edema. Minimal bilateral pitting edema left >right.  Neurologic: Normal EOM,  Alert and oriented x3. No obvious neurologic/cranial nerve deficits.  Assessment & Plan:  Discussed case with my attending in the clinic, Dr. Lynnae January. See problem based charting.

## 2015-04-09 LAB — MICROALBUMIN / CREATININE URINE RATIO
Creatinine, Urine: 150.9 mg/dL
Microalb, Ur: >600 mg/dL — ABNORMAL HIGH (ref <2.0)

## 2015-04-09 LAB — GLUCOSE, CAPILLARY: Glucose-Capillary: 164 mg/dL — ABNORMAL HIGH (ref 70–99)

## 2015-04-09 NOTE — Progress Notes (Signed)
Internal Medicine Clinic Attending  Case discussed with Dr. Kazibwe at the time of the visit.  We reviewed the resident's history and exam and pertinent patient test results.  I agree with the assessment, diagnosis, and plan of care documented in the resident's note. 

## 2015-04-15 ENCOUNTER — Telehealth: Payer: Self-pay | Admitting: Pharmacist

## 2015-04-15 NOTE — Telephone Encounter (Signed)
Following up with patient medication concern. She states she stopped amitriptyline abruptly rather than gradually x 1 week but reports no adverse effects. Advised patient to call clinic if she experiences symptoms such as flu-like symptoms, insomnia, nausea, imbalance, sensory disturbances, and hyperarousal. Patient verbalized understanding by repeating back information.

## 2015-04-17 ENCOUNTER — Encounter (HOSPITAL_COMMUNITY): Payer: Self-pay

## 2015-04-17 ENCOUNTER — Encounter: Payer: Medicare (Managed Care) | Admitting: Internal Medicine

## 2015-04-17 ENCOUNTER — Emergency Department (INDEPENDENT_AMBULATORY_CARE_PROVIDER_SITE_OTHER)
Admission: EM | Admit: 2015-04-17 | Discharge: 2015-04-17 | Disposition: A | Discharge status: Home / Self Care | Payer: Medicare Other | Source: Home / Self Care | Attending: Family Medicine | Admitting: Family Medicine

## 2015-04-17 DIAGNOSIS — R21 Rash and other nonspecific skin eruption: Secondary | ICD-10-CM | POA: Diagnosis not present

## 2015-04-17 HISTORY — DX: Unspecified cataract: H26.9

## 2015-04-17 MED ORDER — HYDROXYZINE HCL 25 MG PO TABS: 25.0000 mg | ORAL_TABLET | Freq: Three times a day (TID) | ORAL | Status: DC | PRN

## 2015-04-17 NOTE — Discharge Instructions (Signed)

## 2015-04-17 NOTE — ED Provider Notes (Signed)
CSN: [id number]     Arrival date & time 04/17/15  1115 History   First MD Initiated Contact with Patient 04/17/15 1301     Chief Complaint  Patient presents with  . Rash   (Consider location/radiation/quality/duration/timing/severity/associated sxs/prior Treatment) Patient is a 64 y.o. female presenting with rash.  Rash Location:  Shoulder/arm Shoulder/arm rash location:  L shoulder, R shoulder, L arm, R arm, L upper arm, R upper arm, L elbow, R elbow, L forearm and R forearm Quality: itchiness   Severity:  Moderate Onset quality:  Gradual Duration:  3 weeks Timing:  Constant Progression:  Spreading Chronicity:  New Context: not animal contact, not chemical exposure, not exposure to similar rash, not hot tub use, not insect bite/sting, not medications, not new detergent/soap, not plant contact, not sick contacts and not sun exposure   Relieved by:  None tried Worsened by:  Nothing tried Ineffective treatments:  None tried Associated symptoms comment:  None   Past Medical History  Diagnosis Date  . Hypertension   . Pulmonary embolism     2011 treated at [facility] in [place].  Was on Coumadin for over a year.  no known family history  . UTI (urinary tract infection)   . High cholesterol   . 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 Depression   . Neuropathy     feet  . Kidney stones   . Pneumonia     "several times" (07/26/2014)  . Type II diabetes mellitus dx'd 2000  . Daily headache   . Migraine     "last one was in the 1990's" (07/26/2014)  . Arthritis     "knees" (07/26/2014)  . Chronic back pain   . Cataract of both eyes    Past Surgical History  Procedure Laterality Date  . Cholecystectomy      1980  . Esophagogastroduodenoscopy  08/19/2012    Procedure: ESOPHAGOGASTRODUODENOSCOPY (EGD);  Surgeon: Winfield Cunas., MD;  Location: [facility] ENDOSCOPY;  Service: Endoscopy;  Laterality: N/A;  . Cesarean section  1982  . Carpal tunnel release Bilateral   .  Tonsillectomy    . Artery biopsy Right 05/09/2014    Procedure: BIOPSY TEMPORAL ARTERY;  Surgeon: Rosetta Posner, MD;  Location: [facility];  Service: Vascular;  Laterality: Right;  . Appendectomy    . Vaginal hysterectomy    . Dilation and curettage of uterus  1982  . Cystoscopy w/ stone manipulation    . Breast biopsy Left   . Temporal artery biopsy / ligation Right 04/2014  . Eye surgery Bilateral   . Refractive surgery Bilateral    Family History  Problem Relation Age of Onset  . Adopted: Yes  . Hypertension Mother    History  Substance Use Topics  . Smoking status: Current Every Day Smoker -- 0.25 packs/day for 44 years    Types: Cigarettes  . Smokeless tobacco: Never Used     Comment: 1/2 PPD  . Alcohol Use: No   OB History    No data available     Review of Systems  Skin: Positive for rash.  All other systems reviewed and are negative.   Allergies  Keflex; Clindamycin/lincomycin; Latex; Sulfa antibiotics; and Sulfur  Home Medications   Prior to Admission medications   Medication Sig Start Date End Date Taking? Authorizing Provider  acetaminophen-codeine (TYLENOL #3) 300-30 MG per tablet Take 1-2 tablets by mouth every 6 (six) hours as needed for moderate pain. 04/08/15  Jessee Avers, MD  amLODipine (NORVASC) 10 MG tablet TAKE 1 TABLET BY MOUTH EVERY DAY 03/27/15   Jessee Avers, MD  carbamide peroxide [facility]) 6.5 % otic solution Place 5 drops into both ears 2 (two) times daily. 02/07/15   Blain Pais, MD  diclofenac sodium (VOLTAREN) 1 % GEL Apply 4 g topically 4 (four) times daily. 11/10/14   Ejiroghene Arlyce Dice, MD  dicyclomine (BENTYL) 10 MG capsule Take 1 capsule (10 mg total) by mouth 4 (four) times daily -  before meals and at bedtime. 02/07/15   Blain Pais, MD  EASY TOUCH INSULIN SYRINGE 31G X 5/16" 1 ML MISC USE AS DIRECTED TWICE DAILY 03/04/15   Madilyn Fireman, MD  gabapentin (NEURONTIN) 300 MG capsule TAKE 1 CAPSULE BY MOUTH THREE TIMES A DAY  10/31/14   Jessee Avers, MD  hydrOXYzine (ATARAX/VISTARIL) 25 MG tablet Take 1 tablet (25 mg total) by mouth 3 (three) times daily as needed for itching. 04/17/15   Audelia Hives Harmoni Lucus, PA  insulin NPH-regular Human (NOVOLIN 70/30) (70-30) 100 UNIT/ML injection Inject 55 Units into the skin 2 (two) times daily with a meal. 04/08/15   Jessee Avers, MD  losartan-hydrochlorothiazide (HYZAAR) 100-25 MG per tablet Take 1 tablet by mouth daily. 04/08/15   Jessee Avers, MD  metFORMIN (GLUCOPHAGE) 1000 MG tablet Take 1 tablet (1,000 mg total) by mouth 2 (two) times daily with a meal. 04/08/15   Jessee Avers, MD  nystatin-triamcinolone ointment [facility]) Apply 1 application topically 2 (two) times daily. 02/07/15   Blain Pais, MD  omeprazole (PRILOSEC) 20 MG capsule Take 1 capsule (20 mg total) by mouth daily. 05/23/14   Jessee Avers, MD  pravastatin (PRAVACHOL) 40 MG tablet TAKE 1 TABLET BY MOUTH EVERY EVENING 01/30/15   Jessee Avers, MD  Rivaroxaban (XARELTO) 15 MG TABS tablet Take 15 mg by mouth daily with supper.    Historical Provider, MD  SANTYL ointment APPLY TO AFFECTED AREA DAILY 03/22/15   Jessee Avers, MD  traZODone (DESYREL) 150 MG tablet Take 1 tablet (150 mg total) by mouth at bedtime. 04/08/15   Jessee Avers, MD   BP 152/90 mmHg  Pulse 95  Temp(Src) 98.1 F (36.7 C) (Oral)  Resp 24  SpO2 97% Physical Exam  Constitutional: She is oriented to person, place, and time. She appears well-developed and well-nourished. No distress.  HENT:  Head: Normocephalic and atraumatic.  Right Ear: Hearing and external ear normal.  Left Ear: Hearing and external ear normal.  Nose: Nose normal.  Mouth/Throat: Uvula is midline and oropharynx is clear and moist. No oral lesions.  Eyes: Conjunctivae are normal.  Cardiovascular: Normal rate, regular rhythm and normal heart sounds.   Pulmonary/Chest: Effort normal and breath sounds normal.  Musculoskeletal: Normal range of motion.   Lymphadenopathy:    She has no cervical adenopathy.  Neurological: She is alert and oriented to person, place, and time.  Skin: Skin is warm and dry. Rash noted.  Multiple small (1-61mm) discrete skin colored papules in various stages of healing on B/L upper extremities.   Psychiatric: She has a normal mood and affect. Her behavior is normal.  Nursing note and vitals reviewed.   ED Course  Procedures (including critical care time) Labs Review Labs Reviewed - No data to display  Imaging Review No results found.   MDM   1. Rash   nonspecific skin eruptions without identifiable trigger. Majority of lesions are nearly healed and none indicate specific distribution, illness or systemic  infection or vasculitis. No mucous membrane involvement and vital signs are reassuring Provided patient with Rx for Atarax for itching and advised PCP follow up if symptoms persist or worsen.    Lutricia Feil, PA 04/17/15 [address], [place] 04/17/15 [phone]

## 2015-04-17 NOTE — ED Notes (Signed)
C/o generlaized rash on left arm, trunk. No one elae in home affected. No new medications

## 2015-04-22 ENCOUNTER — Ambulatory Visit: Payer: Medicare Other | Admitting: Sports Medicine

## 2015-04-25 ENCOUNTER — Encounter: Payer: Self-pay | Admitting: Pharmacist

## 2015-04-25 NOTE — Progress Notes (Signed)
Patient ID: Joy Butler, female   DOB: 1951/08/30, 64 y.o.   MRN: [id number]  Patient was a suboptimal/high-risk warfarin candidate. Collaboration with Engineer, site for successful procurement of Xarelto, copay $7.40 per month.

## 2015-04-29 ENCOUNTER — Telehealth: Payer: Self-pay | Admitting: *Deleted

## 2015-04-29 ENCOUNTER — Ambulatory Visit: Payer: BLUE CROSS/BLUE SHIELD | Admitting: Sports Medicine

## 2015-04-29 ENCOUNTER — Encounter: Payer: Self-pay | Admitting: Sports Medicine

## 2015-04-29 ENCOUNTER — Ambulatory Visit: Payer: Medicare (Managed Care)

## 2015-04-29 ENCOUNTER — Ambulatory Visit (INDEPENDENT_AMBULATORY_CARE_PROVIDER_SITE_OTHER): Payer: Medicare Other | Admitting: Sports Medicine

## 2015-04-29 VITALS — BP 188/96 | Ht 69.0 in | Wt 234.0 lb

## 2015-04-29 DIAGNOSIS — E118 Type 2 diabetes mellitus with unspecified complications: Secondary | ICD-10-CM

## 2015-04-29 DIAGNOSIS — M25561 Pain in right knee: Secondary | ICD-10-CM | POA: Diagnosis not present

## 2015-04-29 DIAGNOSIS — M25562 Pain in left knee: Secondary | ICD-10-CM | POA: Diagnosis not present

## 2015-04-29 MED ORDER — METHYLPREDNISOLONE ACETATE 40 MG/ML IJ SUSP
40.0000 mg | Freq: Once | INTRAMUSCULAR | Status: AC
  Administered 2015-04-29: 40 mg via INTRA_ARTICULAR

## 2015-04-29 NOTE — Telephone Encounter (Signed)
Pharmacy needs new Rx for Novolin 70/30 insulin for 3 vials to last one month. Hilda Blades Sadat Sliwa RN 04/29/15 4:40PM

## 2015-04-29 NOTE — Progress Notes (Signed)
Subjective:    Patient ID: Joy Butler, female    DOB: [birth year], 64 y.o.   MRN: [id number]  HPI Patient is a 64 yo female presenting with bilateral knee pain. Has been present for the past 3 years. Notes it has been getting worse recently. Knees have been giving out on her and popping and locking. Points to the anterior portion of her knee as location of pain. Notes some cracking in knee on getting up. Notes no knee swelling, though has had left calf swelling and states that work up thus far has been negative. Notes she is on xarelto for history of PE. PT has not been beneficial in the past. Takes tylenol with codeine for pain with some benefit. XR's in March with tricompartmental OA in bilateral knees.   Review of Systems     Objective:   Physical Exam  Constitutional: She appears well-developed and well-nourished. No distress.  HENT:  Head: Normocephalic and atraumatic.  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 Discussed options for treatment including continuing current course vs injections. Patient opted for injections of bilateral knees. Discussed risk of bleeding on xarelto and small risk of hemarthrosis with xarelto in addition to risk of infection and pain related to procedure. Patient acknowledged these risks and chose to proceed with injection of bilateral knees. Injections per below procedure  notes. Will obtain standing XR bilateral knees as well to further evaluate DJD. Will call with results.  Blood pressure noted to be elevated today on exam. Patient reports not taking her medication this morning. Repeat mildly improved. Advised to follow-up with her PCP regarding this issue.   Left Knee Injection Procedure Note  Pre-operative Diagnosis: left knee DJD  Post-operative Diagnosis: same  Indications: Symptom relief from osteoarthritis  Anesthesia: topical anesthetic   Procedure Details   Written consent was obtained for the procedure. The joint was prepped with three alcohol swabs. Topical anesthetic was used to numb the skin. A 22 gauge needle was inserted into the medial aspect of the joint. 3 ml 1% lidocaine and 1 ml of depomedrol was then injected into the joint. The needle was removed and pressure was held. The area was then dressed.  Complications:  None; patient tolerated the procedure well.  Right Knee Injection Procedure Note  Pre-operative Diagnosis: right knee DJD  Post-operative Diagnosis: same  Indications: Symptom relief from osteoarthritis  Anesthesia: topical anesthetic  Procedure Details   Written consent was obtained for the procedure. The joint was prepped with three alcohol swabs. Topical anesthetic was used to numb the skin. A 22 gauge needle was inserted into the medial aspect of the joint. 3 ml 1% lidocaine and 1 ml of depomedrol was then injected into the joint. The needle was removed and pressure was held. The area was then dressed.  Complications:  None; patient tolerated the procedure well.  Tommi Rumps, MD [facility] PGY3  Patient was seen and evaluated with the above resident.  I agree with his plan of care. Injections were directly supervised and performed without difficulty. Patient follow-up in 3 weeks as discussed above.

## 2015-04-30 ENCOUNTER — Telehealth: Payer: Self-pay | Admitting: *Deleted

## 2015-04-30 NOTE — Telephone Encounter (Signed)
-----   Message from Carolyne Littles sent at 04/30/2015  2:30 PM EDT ----- Regarding: phone message Pt called in tears she cant use her rt leg since injection she got yesterday.

## 2015-04-30 NOTE — Telephone Encounter (Signed)
Spoke with Joy Butler about her pain and told her that Dr. Micheline Chapman said it could be a steroid flare up from the injection.  Use ice and anti-inflammatories or pain relief and it should subside within the next day. If not to give [place] a call

## 2015-05-01 ENCOUNTER — Other Ambulatory Visit: Payer: Self-pay | Admitting: Internal Medicine

## 2015-05-04 ENCOUNTER — Telehealth: Payer: Self-pay | Admitting: Internal Medicine

## 2015-05-04 ENCOUNTER — Other Ambulatory Visit: Payer: Self-pay | Admitting: Internal Medicine

## 2015-05-04 NOTE — Telephone Encounter (Signed)
  INTERNAL MEDICINE RESIDENCY PROGRAM After-Hours Telephone Call    Reason for call:   I received a call from [name] at 12:41 PM, 05/04/2015 indicating that she feels "wobbly".    Pertinent Data:   Patient states she recently received 2 knee injections for chronic knee pain and her knee pain has improved, but she continues to have an unsteady gait.   She states this is NOT a new symptom.  She otherwise denies dizziness, lightheadedness, chest pain, SOB, palpitations, nausea, vomiting, fever, chills, or headaches.   She checked her CBG and states her blood sugar was in the 100's and has been well controlled lately.   NO OTHER NEURO SYMPTOMS.     Assessment / Plan / Recommendations:   Patient to follow up in the clinic this coming week. Will send an EPIC message to front desk.   As always, pt is advised that if symptoms worsen or new symptoms arise, they should go to an urgent care facility or to to ER for further evaluation.    Corky Sox, MD   05/04/2015, 12:41 PM

## 2015-05-06 ENCOUNTER — Encounter: Payer: Self-pay | Admitting: *Deleted

## 2015-05-07 MED ORDER — INSULIN NPH ISOPHANE & REGULAR (70-30) 100 UNIT/ML ~~LOC~~ SUSP: 55.0000 [IU] | Freq: Two times a day (BID) | SUBCUTANEOUS | Status: DC

## 2015-05-07 NOTE — Telephone Encounter (Signed)
Called in 30 ml ( equivalent of three vials of 10 ml)

## 2015-05-09 NOTE — Addendum Note (Signed)
Addended by: Forde Dandy on: 05/09/2015 12:10 PM   Modules accepted: Orders, Medications

## 2015-05-09 NOTE — Addendum Note (Signed)
Addended by: Forde Dandy on: 05/09/2015 12:47 PM   Modules accepted: Orders, Medications

## 2015-05-22 ENCOUNTER — Ambulatory Visit (INDEPENDENT_AMBULATORY_CARE_PROVIDER_SITE_OTHER): Payer: Medicare Other | Admitting: Internal Medicine

## 2015-05-22 ENCOUNTER — Encounter: Payer: Self-pay | Admitting: Internal Medicine

## 2015-05-22 VITALS — BP 159/89 | HR 103 | Temp 98.0°F | Ht 69.0 in | Wt 234.2 lb

## 2015-05-22 DIAGNOSIS — E11319 Type 2 diabetes mellitus with unspecified diabetic retinopathy without macular edema: Secondary | ICD-10-CM | POA: Diagnosis not present

## 2015-05-22 DIAGNOSIS — E1142 Type 2 diabetes mellitus with diabetic polyneuropathy: Secondary | ICD-10-CM

## 2015-05-22 DIAGNOSIS — E118 Type 2 diabetes mellitus with unspecified complications: Secondary | ICD-10-CM

## 2015-05-22 DIAGNOSIS — I10 Essential (primary) hypertension: Secondary | ICD-10-CM

## 2015-05-22 DIAGNOSIS — Z794 Long term (current) use of insulin: Secondary | ICD-10-CM

## 2015-05-22 DIAGNOSIS — E113599 Type 2 diabetes mellitus with proliferative diabetic retinopathy without macular edema, unspecified eye: Secondary | ICD-10-CM

## 2015-05-22 LAB — POCT GLYCOSYLATED HEMOGLOBIN (HGB A1C): HEMOGLOBIN A1C: 7.7

## 2015-05-22 LAB — GLUCOSE, CAPILLARY: GLUCOSE-CAPILLARY: 211 mg/dL — AB (ref 65–99)

## 2015-05-22 MED ORDER — INSULIN NPH ISOPHANE & REGULAR (70-30) 100 UNIT/ML ~~LOC~~ SUSP: 50.0000 [IU] | Freq: Two times a day (BID) | SUBCUTANEOUS | Status: DC

## 2015-05-22 NOTE — Assessment & Plan Note (Signed)
She requests for a referral to a retinal specialist.

## 2015-05-22 NOTE — Assessment & Plan Note (Signed)
Lab Results  Component Value Date   HGBA1C 7.7 05/22/2015   HGBA1C 8.5 02/07/2015   HGBA1C 13.4 07/26/2014     Assessment: Diabetes control: fair control Progress toward A1C goal:  improved Comments: Patient does not have her glucose meter today. She reports that her blood sugars ran within the ranges of 150-170s. Her wt has remained unchanged since last visit after loosing a lot weight (40lb)previously Wt Readings from Last 5 Encounters:  05/22/15 234 lb 3.2 oz (106.232 kg)  04/29/15 234 lb (106.142 kg)  04/08/15 246 lb 4.8 oz (111.721 kg)  02/07/15 252 lb 8 oz (114.533 kg)  11/21/14 274 lb 6.4 oz (124.467 kg)   There is some confusion about her current dose of Insulin NPH 70/30 as the patient reports that she takes 45 and sometimes 55 units twice a day but in her chart. I had kept her on 55 units last visit after she told that she was taking that amount even though in the chart was indicated as 48 units twice a day. She denies any hypoglycemia. She takes Metformin 1000 mg bid. She is off steroids for temporal arteritis. End date around mid-feb.   Plan: Medications:  Encouraged her to take insulin NPH 70/30 50 units bid and Metformin 1000mg  bid.  Home glucose monitoring: Frequency:   Timing:   Instruction/counseling given: reminded to bring blood glucose meter & log to each visit, discussed foot care, discussed the need for weight loss and discussed diet Educational resources provided: brochure, handout Self management tools provided:   Other plans:  Follow up in 3 months Encouraged continued wt loss

## 2015-05-22 NOTE — Progress Notes (Signed)
Patient ID: Joy Butler, female   DOB: 08-02-1951, 64 y.o.   MRN: [id number]   Subjective:   HPI: [name] is a 64 y.o. woman with past medical history below presents for a follow up visit.  She has no complaint today.  She had knee injections recently and her knee pain has been manageable since.  [facility]  Kindly see the A&P for the status of the pt's chronic medical problems.   Past Medical History  Diagnosis Date  . Hypertension   . Pulmonary embolism     2011 treated at [facility] in [place].  Was on Coumadin for over a year.  no known family history  . UTI (urinary tract infection)   . High cholesterol   . 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 Depression   . Neuropathy     feet  . Kidney stones   . Pneumonia     "several times" (07/26/2014)  . Type II diabetes mellitus dx'd 2000  . Daily headache   . Migraine     "last one was in the 1990's" (07/26/2014)  . Arthritis     "knees" (07/26/2014)  . Chronic back pain   . Cataract of both eyes     ROS: Constitutional:  Denies fevers, chills, diaphoresis, appetite change and fatigue.  Respiratory: Denies SOB, DOE, cough, chest tightness, and wheezing.  CVS: No chest pain, palpitations and leg swelling.  GI: No abdominal pain, nausea, vomiting, bloody stools GU: No dysuria, frequency, hematuria, or flank pain.  MSK: No myalgias, back pain, joint swelling, arthralgias  Psych: No depression symptoms. No SI or SA. She sleeps better with Trazodone which I started her on last visit.    Objective:  Physical Exam: Filed Vitals:   05/22/15 1414  BP: 159/89  Pulse: 103  Temp: 98 F (36.7 C)  TempSrc: Oral  Height: 5\' 9"  (1.753 m)  Weight: 234 lb 3.2 oz (106.232 kg)  SpO2: 98%   General: Well nourished. No acute distress.  HEENT: Normal oral mucosa. MMM.  Lungs: CTA bilaterally. No wheezing. Heart: RRR; no extra sounds or murmurs  Abdomen: Non-distended, normal bowel  sounds, soft, nontender; no hepatosplenomegaly  Extremities: No pedal edema. No joint swelling or tenderness. Neurologic: Normal EOM,  Alert and oriented x3. No obvious neurologic/cranial nerve deficits.  Assessment & Plan:  Discussed case with Dr Lynnae January See problem based charting for assessment and plan.

## 2015-05-22 NOTE — Patient Instructions (Addendum)
General Instructions: Please take insulin 50 unit twice a day Please take you blood pressure medications Please come back in three months Thank you for bringing your medicines today. This helps [place] keep you safe from mistakes.   Progress Toward Treatment Goals:  Treatment Goal 05/22/2015  Hemoglobin A1C improved  Blood pressure deteriorated  Stop smoking unable to assess  Prevent falls improved    Self Care Goals & Plans:  Self Care Goal 05/22/2015  Manage my medications take my medicines as prescribed; bring my medications to every visit; refill my medications on time; follow the sick day instructions if I am sick  Monitor my health keep track of my blood glucose; keep track of my blood pressure; check my feet daily  Eat healthy foods eat more vegetables; eat fruit for snacks and desserts; eat foods that are low in salt; eat baked foods instead of fried foods; eat smaller portions; drink diet soda or water instead of juice or soda  Be physically active find an activity I enjoy  Stop smoking -  Prevent falls -  Meeting treatment goals -    Home Blood Glucose Monitoring 04/08/2015  Check my blood sugar 3 times a day  When to check my blood sugar before breakfast; before lunch; before dinner     Care Management & Community Referrals:  Referral 05/22/2015  Referrals made for care management support none needed  Referrals made to community resources -

## 2015-05-22 NOTE — Assessment & Plan Note (Signed)
BP Readings from Last 3 Encounters:  05/22/15 159/89  04/29/15 188/96  04/17/15 152/90    Lab Results  Component Value Date   NA 138 02/07/2015   K 3.6 02/07/2015   CREATININE 1.42* 02/07/2015    Assessment: Blood pressure control: mildly elevated Progress toward BP goal:  deteriorated Comments: well controlled on Hyzaar 100-25 mg qday and Amlodipine 10 mg daily. She states that she has not taken her medications today and yesterday.   Plan: Medications:  continue current medications Educational resources provided: brochure, handout, video Self management tools provided:   Other plans: routine follow up Consider titrating her HTN medications if her BP remains high. The issues with her has been adhrence to her treatment so i will not change it today.

## 2015-05-22 NOTE — Assessment & Plan Note (Signed)
She requests a walking cane and a shower chair.  Otherwise pain in her feet is better controlled today with Gabapentin.

## 2015-05-24 NOTE — Progress Notes (Signed)
Internal Medicine Clinic Attending  Case discussed with Dr. Kazibwe soon after the resident saw the patient.  We reviewed the resident's history and exam and pertinent patient test results.  I agree with the assessment, diagnosis, and plan of care documented in the resident's note. 

## 2015-06-14 ENCOUNTER — Telehealth: Payer: Self-pay | Admitting: Pharmacist

## 2015-06-14 DIAGNOSIS — I2699 Other pulmonary embolism without acute cor pulmonale: Secondary | ICD-10-CM

## 2015-06-14 NOTE — Telephone Encounter (Addendum)
Joy Butler is a 64 y.o. female who reports to the clinic for monitoring of rivaroxaban (Xarelto) therapy.    ASSESSMENT Indication(s): recurrent PE Duration: indefinite Date switched from warfarin: 03/27/15  Labs:    Component Value Date/Time   AST 15 02/07/2015 1404   ALT 9 02/07/2015 1404   NA 138 02/07/2015 1404   K 3.6 02/07/2015 1404   CL 102 02/07/2015 1404   CO2 26 02/07/2015 1404   BUN 11 02/07/2015 1404   CREATININE 1.42* 02/07/2015 1404   CREATININE 1.37* 07/31/2014 1538   CALCIUM 9.0 02/07/2015 1404   GFRAA 44* 02/07/2015 1404   GFRAA 47* 07/31/2014 1538   WBC 9.2 02/07/2015 1404   HGB 14.3 02/07/2015 1404   HCT 44.2 02/07/2015 1404   PLT 390 02/07/2015 1404   rivaroxaban (Xarelto) Dose: 20 mg daily  Adherence: Patient reports no known adherence challenges, however pharmacy history shows last fill date 05/06/15 and 0 refills so called in Rx with 3 refills  Safety: Patient reports no recent signs or symptoms of bleeding, no signs of symptoms of thromboembolism. Medication changes: no.  RECOMMENDATIONS  Medication: no changes recommended at this time  Monitoring: CBC/CMP For ongoing monitoring, European Society of Cardiology [facility]) Guidelines (no American guidelines currently available) suggest evaluating adherence, thromboembolic or bleeding events, other side effects, medication changes at each office visit. Blood sampling (hemoglobin, renal/hepatic function) should be considered yearly, but for elderly or frail patients should be considered every 6 months. Renal patients or hepatic patients may need more frequent monitoring.  Signing off of case at this time due to successful transition from warfarin to [place] and will continue to be available for assistance with patient care in the future as necessary.  [place] Pharmacist 06/21/2015, 9:17 AM

## 2015-06-19 MED ORDER — RIVAROXABAN 20 MG PO TABS: 20.0000 mg | ORAL_TABLET | Freq: Every day | ORAL | Status: DC

## 2015-06-25 ENCOUNTER — Other Ambulatory Visit: Payer: Self-pay | Admitting: Internal Medicine

## 2015-06-26 NOTE — Telephone Encounter (Signed)
Needs PCP appt late Aug / early Sep pls. Routine appt

## 2015-06-26 NOTE — Telephone Encounter (Signed)
Called to PPA 

## 2015-07-16 ENCOUNTER — Ambulatory Visit: Payer: Medicare Other | Admitting: Internal Medicine

## 2015-07-17 ENCOUNTER — Ambulatory Visit (INDEPENDENT_AMBULATORY_CARE_PROVIDER_SITE_OTHER): Payer: Medicare Other | Admitting: Internal Medicine

## 2015-07-17 ENCOUNTER — Other Ambulatory Visit: Payer: Self-pay | Admitting: Oncology

## 2015-07-17 ENCOUNTER — Other Ambulatory Visit: Payer: Self-pay | Admitting: Internal Medicine

## 2015-07-17 ENCOUNTER — Encounter: Payer: Self-pay | Admitting: Internal Medicine

## 2015-07-17 VITALS — BP 148/77 | HR 88 | Temp 97.8°F | Wt 244.2 lb

## 2015-07-17 DIAGNOSIS — R197 Diarrhea, unspecified: Secondary | ICD-10-CM | POA: Diagnosis not present

## 2015-07-17 DIAGNOSIS — R1031 Right lower quadrant pain: Secondary | ICD-10-CM | POA: Diagnosis not present

## 2015-07-17 DIAGNOSIS — R109 Unspecified abdominal pain: Secondary | ICD-10-CM | POA: Insufficient documentation

## 2015-07-17 LAB — COMPLETE METABOLIC PANEL WITH GFR
ALT: 16 U/L (ref 6–29)
AST: 17 U/L (ref 10–35)
Albumin: 2.7 g/dL — ABNORMAL LOW (ref 3.6–5.1)
Alkaline Phosphatase: 141 U/L — ABNORMAL HIGH (ref 33–130)
BUN: 17 mg/dL (ref 7–25)
CALCIUM: 8.8 mg/dL (ref 8.6–10.4)
CO2: 28 mEq/L (ref 20–31)
Chloride: 105 mEq/L (ref 98–110)
Creat: 1.27 mg/dL — ABNORMAL HIGH (ref 0.50–0.99)
GFR, EST NON AFRICAN AMERICAN: 44 mL/min — AB (ref >=60)
GFR, Est African American: 51 mL/min — ABNORMAL LOW (ref >=60)
Glucose, Bld: 97 mg/dL (ref 65–99)
Potassium: 3.9 mEq/L (ref 3.5–5.3)
SODIUM: 141 meq/L (ref 135–146)
Total Bilirubin: 0.3 mg/dL (ref 0.2–1.2)
Total Protein: 6.1 g/dL (ref 6.1–8.1)

## 2015-07-17 LAB — CBC WITH DIFFERENTIAL/PLATELET
BASOS PCT: 1 % (ref 0–1)
Basophils Absolute: 0.1 10*3/uL (ref 0.0–0.1)
EOS PCT: 3 % (ref 0–5)
Eosinophils Absolute: 0.3 10*3/uL (ref 0.0–0.7)
HCT: 40.4 % (ref 36.0–46.0)
HEMOGLOBIN: 13.2 g/dL (ref 12.0–15.0)
Lymphocytes Relative: 22 % (ref 12–46)
Lymphs Abs: 1.9 10*3/uL (ref 0.7–4.0)
MCH: 25.9 pg — ABNORMAL LOW (ref 26.0–34.0)
MCHC: 32.7 g/dL (ref 30.0–36.0)
MCV: 79.2 fL (ref 78.0–100.0)
MONO ABS: 1 10*3/uL (ref 0.1–1.0)
MONOS PCT: 11 % (ref 3–12)
MPV: 9.1 fL (ref 8.6–12.4)
NEUTROS ABS: 5.5 10*3/uL (ref 1.7–7.7)
NEUTROS PCT: 63 % (ref 43–77)
PLATELETS: 355 10*3/uL (ref 150–400)
RBC: 5.1 MIL/uL (ref 3.87–5.11)
RDW: 18.4 % — ABNORMAL HIGH (ref 11.5–15.5)
WBC: 8.8 10*3/uL (ref 4.0–10.5)

## 2015-07-17 LAB — LIPASE: LIPASE: 41 U/L (ref 0–75)

## 2015-07-17 MED ORDER — TRAMADOL HCL 50 MG PO TABS: 50.0000 mg | ORAL_TABLET | Freq: Three times a day (TID) | ORAL | Status: DC | PRN

## 2015-07-17 NOTE — Assessment & Plan Note (Signed)
Pt reports abdominal RLQ pain for few days, and diarrhea for few months. Exam was unremarkable except some RLQ pain upon palpation and some rebound tenderness. So no acute abdomen.  A: unclear etiology. She may have a resolving viral GI infection,  Plan: ordered CBC and CMP, lipase.  Ordered UA complete. Asked patient to hold metformin RTC in about a week, earlier if needed. Regarding her diarrhea, we have referred her to GI for management of her IBS- as currently she takes Bentyl occasionally.  Given tramadol for pain- she has CKD so did not prescribe NSAIDs, additionally if she has decreased renal function on the CMP then we will need to adjust her medications to renally dose it. Also asked to hold metformin for a week to see how she does. Also conservative management with soft diet for few days.

## 2015-07-17 NOTE — Patient Instructions (Addendum)
  Please eat soft diet for few days  Please hold the metformin for 1 week and follow up next week  We have ordered the blood work and will call you if they are concerning  Please take the tramadol for pain as needed, and return or go to ER if the pain worsens  General Instructions:   Please bring your medicines with you each time you come to clinic.  Medicines may include prescription medications, over-the-counter medications, herbal remedies, eye drops, vitamins, or other pills.   Progress Toward Treatment Goals:  Treatment Goal 05/22/2015  Hemoglobin A1C improved  Blood pressure deteriorated  Stop smoking unable to assess  Prevent falls improved    Self Care Goals & Plans:  Self Care Goal 05/22/2015  Manage my medications take my medicines as prescribed; bring my medications to every visit; refill my medications on time; follow the sick day instructions if I am sick  Monitor my health keep track of my blood glucose; keep track of my blood pressure; check my feet daily  Eat healthy foods eat more vegetables; eat fruit for snacks and desserts; eat foods that are low in salt; eat baked foods instead of fried foods; eat smaller portions; drink diet soda or water instead of juice or soda  Be physically active find an activity I enjoy  Stop smoking -  Prevent falls -  Meeting treatment goals -    Home Blood Glucose Monitoring 04/08/2015  Check my blood sugar 3 times a day  When to check my blood sugar before breakfast; before lunch; before dinner     Care Management & Community Referrals:  Referral 05/22/2015  Referrals made for care management support none needed  Referrals made to community resources -

## 2015-07-17 NOTE — Progress Notes (Signed)
Patient ID: Joy Butler, female   DOB: 1951-01-13, 64 y.o.   MRN: [id number]    Subjective:   Patient ID: Joy Butler female   DOB: June 23, 1951 64 y.o.   MRN: [id number]  HPI: [name] is a 64 y.o. woman who is here for a chief complaint of "not feeling well", and body aches for few days. She also has right lower quadrant mild abdominal pain. She has had ongoing diarrhea for last few months, and has been taking bentyl occassionally.  This has been accompanied by some swelling in the legs.  She has had some nausea but no vomiting. No fevers or constitutional symptoms, Able to tolerate PO diet- . Has had some diarrhea, last BM was today and no constipation.  She had a history of 45 lb removal of tumor which she later recalled as "fibroids", in the 1980s and feels like it is the same kind of bloating sensation and "something moving in her stomach".   Other PMH of T2DM, HTN, and history of PE, on anticoag. Other [place] noted below    Past Medical History  Diagnosis Date  . Hypertension   . Pulmonary embolism     2011 treated at [facility] in [place].  Was on Coumadin for over a year.  no known family history  . UTI (urinary tract infection)   . High cholesterol   . 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 Depression   . Neuropathy     feet  . Kidney stones   . Pneumonia     "several times" (07/26/2014)  . Type II diabetes mellitus dx'd 2000  . Daily headache   . Migraine     "last one was in the 1990's" (07/26/2014)  . Arthritis     "knees" (07/26/2014)  . Chronic back pain   . Cataract of both eyes    Current Outpatient Prescriptions  Medication Sig Dispense Refill  . acetaminophen-codeine (TYLENOL #3) 300-30 MG per tablet TAKE 1 TO 2 TABLETS BY MOUTH EVERY 6 HOURS AS NEEDED FOR MODERATE PAIN 45 tablet 2  . amLODipine (NORVASC) 10 MG tablet TAKE 1 TABLET BY MOUTH EVERY DAY 60 tablet 2  . carbamide peroxide (DEBROX) 6.5 % otic solution Place 5 drops into both  ears 2 (two) times daily. 15 mL 0  . diclofenac sodium (VOLTAREN) 1 % GEL Apply 4 g topically 4 (four) times daily. 1 Tube 0  . dicyclomine (BENTYL) 10 MG capsule Take 1 capsule (10 mg total) by mouth 4 (four) times daily -  before meals and at bedtime. 30 capsule 0  . EASY TOUCH INSULIN SYRINGE 31G X 5/16" 1 ML MISC USE AS DIRECTED TWICE DAILY 100 each 5  . gabapentin (NEURONTIN) 300 MG capsule TAKE 1 CAPSULE BY MOUTH THREE TIMES A DAY 270 capsule 3  . insulin NPH-regular Human (NOVOLIN 70/30) (70-30) 100 UNIT/ML injection Inject 50 Units into the skin 2 (two) times daily with a meal. 30 mL 12  . losartan-hydrochlorothiazide (HYZAAR) 100-25 MG per tablet Take 1 tablet by mouth daily. 90 tablet 2  . metFORMIN (GLUCOPHAGE) 1000 MG tablet Take 1 tablet (1,000 mg total) by mouth 2 (two) times daily with a meal. 60 tablet 6  . pravastatin (PRAVACHOL) 40 MG tablet TAKE 1 TABLET BY MOUTH EVERY EVENING 90 tablet 3  . rivaroxaban (XARELTO) 20 MG TABS tablet Take 1 tablet (20 mg total) by mouth daily with supper. 30 tablet 3  .  traZODone (DESYREL) 150 MG tablet TAKE 1 TABLET BY MOUTH EVERY NIGHT AT BEDTIME 30 tablet 1  . hydrOXYzine (ATARAX/VISTARIL) 25 MG tablet Take 1 tablet (25 mg total) by mouth 3 (three) times daily as needed for itching. (Patient not taking: Reported on 07/17/2015) 30 tablet 0  . nystatin-triamcinolone ointment (MYCOLOG) Apply 1 application topically 2 (two) times daily. (Patient not taking: Reported on 07/17/2015) 30 g 0  . omeprazole (PRILOSEC) 20 MG capsule TAKE 1 CAPSULE BY MOUTH EVERY DAY (Patient not taking: Reported on 07/17/2015) 30 capsule 2  . SANTYL ointment APPLY TO AFFECTED AREA DAILY (Patient not taking: Reported on 07/17/2015) 30 g 2  . traMADol (ULTRAM) 50 MG tablet Take 1 tablet (50 mg total) by mouth every 8 (eight) hours as needed. 20 tablet 0   No current facility-administered medications for this visit.   Family History  Problem Relation Age of Onset  . Adopted:  Yes  . Hypertension Mother    History   Social History  . Marital Status: Single    Spouse Name: N/A  . Number of Children: N/A  . Years of Education: N/A   Social History Main Topics  . Smoking status: Current Every Day Smoker -- 0.25 packs/day for 44 years    Types: Cigarettes  . Smokeless tobacco: Never Used     Comment: 1/2 PPD  . Alcohol Use: No  . Drug Use: No  . Sexual Activity: Yes   Other Topics Concern  . None   Social History Narrative   Previously divorced now engaged with her partner of 4 years.  Has 6 grown children with 21 grandchildren.  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  Moved to [place] in August 2013 and was transiently homeless until first part of October 2013.     Review of Systems: Review of Systems  Constitutional: Positive for malaise/fatigue. Negative for fever, chills and weight loss.       Intentional weight loss of about 20 lbs over the past years  HENT: Negative.   Respiratory: Negative.   Cardiovascular: Negative.   Gastrointestinal: Positive for nausea, abdominal pain and diarrhea. Negative for constipation and blood in stool.  Genitourinary: Negative for dysuria, urgency, frequency, hematuria and flank pain.  Musculoskeletal: Positive for myalgias and back pain. Negative for joint pain, falls and neck pain.  Neurological: Negative.   All other systems reviewed and are negative.   Objective:  Physical Exam: Filed Vitals:   07/17/15 1539  BP: 148/77  Pulse: 88  Temp: 97.8 F (36.6 C)  TempSrc: Oral  Weight: 244 lb 3.2 oz (110.768 kg)  SpO2: 100%   Physical Exam  Constitutional: She is oriented to person, place, and time. She appears well-developed and well-nourished.  obese in wheelchair  Neck: Normal range of motion. Neck supple.  Cardiovascular: Normal rate, regular rhythm and normal heart sounds.   Pulmonary/Chest: Effort normal and breath sounds normal.  Abdominal:  Obese,  nondistended, tender to palpation on right lower quadrant, no guarding, some rebound tenderness, no palpable mass   Tenderness present in RLQ down to the right pelvic area  Pain "moderate"  Genitourinary:  No flank pain b/l  Musculoskeletal: Normal range of motion.  Neurological: She is alert and oriented to person, place, and time.    Assessment & Plan:   Please see problem based charting for assessment and plan

## 2015-07-17 NOTE — Progress Notes (Signed)
Internal Medicine Clinic Attending  I saw and evaluated the patient.  I personally confirmed the key portions of the history and exam documented by Dr. Tiburcio Pea and I reviewed pertinent patient test results.  The assessment, diagnosis, and plan were formulated together and I agree with the documentation in the resident's note.   64 year old female with a RLQ abdominal pain - history of diarrhea for several months, several times a day as soon as she eats, no nocturnal diarrhea, being treated for unconfirmed IBS with dicyclomine with no relief per patient. Currently few days of RLQ pain with nausea. No change in diarrhea/last BM this morning. Exam - benign abdomen with mild tenderness at RLQ, no flank pain, obese belly no distension. Unsure of etiology, could be worsening of her chronic IBS, or a viral etiology. Conservative management with soft diet, bowel rest. Referral to GI for chronic diarrhea. CMP, CBC today.  Also complains of bodyache - generalized (also has knee pain from prior and gets knee injections at sports medicine), since the time abdominal pain started. Has CKD, this NSAIDs cannot be prescribed. The patient is asking for vicodin, She has been prescribed opiates before but she is not on contract. Some alcohol use history noted on EPIC. Checked narcotic database for 6 months back and no suspicious activity noted, has been on Tylenol No 3 (reports no effect). I will prescribe 10 pills of vicodin.   Mild pitting edema in bilateral LE likely due to amlodipine. Madilyn Fireman MD MPH 07/17/2015 5:19 PM

## 2015-07-18 LAB — URINALYSIS, COMPLETE
BILIRUBIN URINE: NEGATIVE
Bacteria, UA: NONE SEEN [HPF]
Casts: NONE SEEN [LPF]
Crystals: NONE SEEN [HPF]
Ketones, ur: NEGATIVE
LEUKOCYTES UA: NEGATIVE
Nitrite: NEGATIVE
Specific Gravity, Urine: 1.015 (ref 1.001–1.035)
YEAST: NONE SEEN [HPF]
pH: 6.5 (ref 5.0–8.0)

## 2015-07-30 ENCOUNTER — Telehealth: Payer: Self-pay | Admitting: Internal Medicine

## 2015-07-30 ENCOUNTER — Encounter: Payer: Self-pay | Admitting: *Deleted

## 2015-07-30 NOTE — Telephone Encounter (Signed)
Patient calling states that she is having extreme pain in both of her knees and is making it difficult to walk. Said she has gotten shots in them before wants to know if she can do it again.

## 2015-07-30 NOTE — Telephone Encounter (Signed)
Return call to pt - message left on ID recording [facility] called.

## 2015-07-31 NOTE — Telephone Encounter (Signed)
Called pt back, suggested she call dr draper's office for appt, gave her the ph#, she will call clinic back if needs to be seen in [facility]

## 2015-07-31 NOTE — Telephone Encounter (Signed)
Pt called back requesting the nurse to call back about knee injection.

## 2015-08-05 ENCOUNTER — Encounter: Payer: Self-pay | Admitting: Gastroenterology

## 2015-08-07 ENCOUNTER — Ambulatory Visit: Payer: Medicare Other | Admitting: Sports Medicine

## 2015-08-13 ENCOUNTER — Ambulatory Visit (INDEPENDENT_AMBULATORY_CARE_PROVIDER_SITE_OTHER): Payer: Medicare Other | Admitting: Sports Medicine

## 2015-08-13 ENCOUNTER — Encounter: Payer: Self-pay | Admitting: Sports Medicine

## 2015-08-13 VITALS — BP 152/81 | Ht 69.0 in | Wt 237.0 lb

## 2015-08-13 DIAGNOSIS — M1711 Unilateral primary osteoarthritis, right knee: Secondary | ICD-10-CM

## 2015-08-13 DIAGNOSIS — M1712 Unilateral primary osteoarthritis, left knee: Secondary | ICD-10-CM

## 2015-08-13 MED ORDER — METHYLPREDNISOLONE ACETATE 40 MG/ML IJ SUSP
40.0000 mg | Freq: Once | INTRAMUSCULAR | Status: AC
  Administered 2015-08-13: 40 mg via INTRA_ARTICULAR

## 2015-08-13 NOTE — Progress Notes (Signed)
    Subjective:  Joy Butler is a 64 y.o. female who presents to the [facility] today with a chief complaint of bilateral knee pain.   HPI: Patient presents for with bilateral knee pain for the past several years. Was seen in this clinic 3 months ago and received bilateral knee injections which helped with the pain until about 2 weeks ago. HAs tried taking tylenol, ibuprofen, and codeine in the past without significant relief. Has also tried physical therapy which did not help. Overall, patient reports that her pain is about the same as three months ago. Patient requests repeat knee injections today. This will be the patient's second set of injections.   Objective:  Physical Exam: BP 152/81 mmHg  Ht 5\' 9"  (1.753 m)  Wt 237 lb (107.502 kg)  BMI 34.98 kg/m2  Gen: NAD, resting comfortably Lungs: NWOB Right Knee: No deformities. Joint lin tenderness appreciated. Crepitus noted on passive ROM. FROM. Strength 5/5.  Left Knee: No deformities. Joint lin tenderness appreciated. Crepitus noted on passive ROM. FROM. Strength 5/5. Gait: Walks with a cane with a limp.  Skin: warm, dry Neuro: grossly normal, moves all extremities Psych: Normal affect and thought content  Imaging: Knee plain films in March 2016, with bilateral OA.   Assessment/Plan:  Bilateral Knee OA Repeated injections bilaterally today. Follow up as needed for repeat injections. Continue medical management. Will not refer to PT at this time as it has not been helpful in the past.   Left Knee Injection Procedure Note  Pre-operative Diagnosis: left knee DJD  Post-operative Diagnosis: same  Indications: Symptom relief from osteoarthritis  Anesthesia: topical anesthetic   Procedure Details   Written consent was obtained for the procedure. The joint was prepped with three alcohol swabs. Topical anesthetic was used to numb the skin. A 22 gauge needle was inserted into the lateral aspect of the joint. 3 ml 1% lidocaine and 1 ml  of depomedrol was then injected into the joint. The needle was removed and pressure was held. The area was then dressed.  Complications: None; patient tolerated the procedure well.  Right Knee Injection Procedure Note  Pre-operative Diagnosis: right knee DJD  Post-operative Diagnosis: same  Indications: Symptom relief from osteoarthritis  Anesthesia: topical anesthetic  Procedure Details   Written consent was obtained for the procedure. The joint was prepped with three alcohol swabs. Topical anesthetic was used to numb the skin. A 22 gauge needle was inserted into the lateral aspect of the joint. 3 ml 1% lidocaine and 1 ml of depomedrol was then injected into the joint. The needle was removed and pressure was held. The area was then dressed.  Complications: None; patient tolerated the procedure well.  Algis Greenhouse. Jerline Pain, [place] Resident PGY-2 08/13/2015 10:53 AM   Patient seen and evaluated with the resident. I agree with the plan of care. I was present for both knee injections. Procedure was carried out without complication. Follow-up as needed.

## 2015-08-16 ENCOUNTER — Telehealth: Payer: Self-pay | Admitting: Internal Medicine

## 2015-08-16 NOTE — Telephone Encounter (Signed)
Pt called requesting sinus med to be filled.

## 2015-08-19 ENCOUNTER — Other Ambulatory Visit: Payer: Self-pay | Admitting: Internal Medicine

## 2015-08-19 MED ORDER — LORATADINE 10 MG PO TABS: 10.0000 mg | ORAL_TABLET | Freq: Every day | ORAL | Status: DC

## 2015-08-19 NOTE — Telephone Encounter (Signed)
Pt called and requested "my sinus medicine, i havent had to use it in a long time", i do not find anything in her med list history, she states it was claritin, will you order this or does she need an appt?

## 2015-08-22 ENCOUNTER — Other Ambulatory Visit: Payer: Self-pay | Admitting: Oncology

## 2015-09-05 ENCOUNTER — Encounter (HOSPITAL_COMMUNITY): Payer: Self-pay | Admitting: Emergency Medicine

## 2015-09-05 ENCOUNTER — Emergency Department (INDEPENDENT_AMBULATORY_CARE_PROVIDER_SITE_OTHER)
Admission: EM | Admit: 2015-09-05 | Discharge: 2015-09-05 | Disposition: A | Discharge status: Home / Self Care | Payer: Medicare Other | Source: Home / Self Care | Attending: Family Medicine | Admitting: Family Medicine

## 2015-09-05 DIAGNOSIS — B349 Viral infection, unspecified: Secondary | ICD-10-CM

## 2015-09-05 LAB — GLUCOSE, CAPILLARY: Glucose-Capillary: 138 mg/dL — ABNORMAL HIGH (ref 65–99)

## 2015-09-05 MED ORDER — ACETAMINOPHEN 325 MG PO TABS
ORAL_TABLET | ORAL | Status: AC
  Filled 2015-09-05: qty 2

## 2015-09-05 NOTE — Discharge Instructions (Signed)
Viral Infections No direct sign of infection found. Likely a virus.  For worsening or new symptoms go to the ED or see your doctor. Drink plenty of fluids Check blood sugar regularly. A viral infection can be caused by different types of viruses.Most viral infections are not serious and resolve on their own. However, some infections may cause severe symptoms and may lead to further complications. SYMPTOMS Viruses can frequently cause:  Minor sore throat.  Aches and pains.  Headaches.  Runny nose.  Different types of rashes.  Watery eyes.  Tiredness.  Cough.  Loss of appetite.  Gastrointestinal infections, resulting in nausea, vomiting, and diarrhea. These symptoms do not respond to antibiotics because the infection is not caused by bacteria. However, you might catch a bacterial infection following the viral infection. This is sometimes called a "superinfection." Symptoms of such a bacterial infection may include:  Worsening sore throat with pus and difficulty swallowing.  Swollen neck glands.  Chills and a high or persistent fever.  Severe headache.  Tenderness over the sinuses.  Persistent overall ill feeling (malaise), muscle aches, and tiredness (fatigue).  Persistent cough.  Yellow, green, or brown mucus production with coughing. HOME CARE INSTRUCTIONS   Only take over-the-counter or prescription medicines for pain, discomfort, diarrhea, or fever as directed by your caregiver.  Drink enough water and fluids to keep your urine clear or pale yellow. Sports drinks can provide valuable electrolytes, sugars, and hydration.  Get plenty of rest and maintain proper nutrition. Soups and broths with crackers or rice are fine. SEEK IMMEDIATE MEDICAL CARE IF:   You have severe headaches, shortness of breath, chest pain, neck pain, or an unusual rash.  You have uncontrolled vomiting, diarrhea, or you are unable to keep down fluids.  You or your child has an oral  temperature above 102 F (38.9 C), not controlled by medicine.  Your baby is older than 3 months with a rectal temperature of 102 F (38.9 C) or higher.  Your baby is 61 months old or younger with a rectal temperature of 100.4 F (38 C) or higher. MAKE SURE YOU:   Understand these instructions.  Will watch your condition.  Will get help right away if you are not doing well or get worse. Document Released: 09/16/2005 Document Revised: 02/29/2012 Document Reviewed: 04/13/2011 [facility] Patient Information 2015 [place], [place]. This information is not intended to replace advice given to you by your health care provider. Make sure you discuss any questions you have with your health care provider.

## 2015-09-05 NOTE — ED Notes (Signed)
Complains of chills and aching all over.  Complains of symptoms for 2 days.  Patient reports nausea, no vomiting. Patient has diarrhea with eating.  Patient has a cough.   Complains of a slight runny nose.

## 2015-09-05 NOTE — ED Provider Notes (Signed)
CSN: [id number]     Arrival date & time 09/05/15  1854 History   First MD Initiated Contact with Patient 09/05/15 2046     Chief Complaint  Patient presents with  . Chills   (Consider location/radiation/quality/duration/timing/severity/associated sxs/prior Treatment) HPI Comments: 64 year old female complaining of a 2 day history of body aches and feeling cold. Has a low bit a headache sometimes. She also states that occasionally she has discomfort in her right ear. Denies having fever. Denies rash. Denies sore throat, breast for congestion, neck pain, back pain, chest pain, heaviness, tightness, fourth, pressure, dyspnea, abdominal pain, genitourinary symptoms, swelling. She has a wound to the left toe plantar aspect where she had surgery. This wound is healing nicely there are no signs of infection.   Past Medical History  Diagnosis Date  . Hypertension   . Pulmonary embolism     2011 treated at [facility] in [place].  Was on Coumadin for over a year.  no known family history  . UTI (urinary tract infection)   . High cholesterol   . 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 Depression   . Neuropathy     feet  . Kidney stones   . Pneumonia     "several times" (07/26/2014)  . Type II diabetes mellitus dx'd 2000  . Daily headache   . Migraine     "last one was in the 1990's" (07/26/2014)  . Arthritis     "knees" (07/26/2014)  . Chronic back pain   . Cataract of both eyes    Past Surgical History  Procedure Laterality Date  . Cholecystectomy      1980  . Esophagogastroduodenoscopy  08/19/2012    Procedure: ESOPHAGOGASTRODUODENOSCOPY (EGD);  Surgeon: Winfield Cunas., MD;  Location[facility] END[facility]COPY;  Service: Endoscopy;  Laterality: N/A;  . Cesarean section  1982  . Carpal tunnel release Bilateral   . Tonsillectomy    . Artery biopsy Right 05/09/2014    Procedure: BIOPSY TEMPORAL ARTERY;  Surgeon: Rosetta Posner, MD;  Location: [place];  Service: Vascular;  Laterality: Right;  .  Appendectomy    . Vaginal hysterectomy    . Dilation and curettage of uterus  1982  . Cystoscopy w/ stone manipulation    . Breast biopsy Left   . Temporal artery biopsy / ligation Right 04/2014  . Eye surgery Bilateral   . Refractive surgery Bilateral    Family History  Problem Relation Age of Onset  . Adopted: Yes  . Hypertension Mother    Social History  Substance Use Topics  . Smoking status: Current Every Day Smoker -- 0.25 packs/day for 44 years    Types: Cigarettes  . Smokeless tobacco: Never Used     Comment: 1/2 PPD  . Alcohol Use: No   OB History    No data available     Review of Systems  Constitutional: Positive for chills and activity change. Negative for fever.  HENT: Positive for ear pain. Negative for congestion, facial swelling, hearing loss, postnasal drip, rhinorrhea, sneezing and sore throat.   Eyes: Negative for photophobia, pain and discharge.  Respiratory: Negative for cough, choking, chest tightness, shortness of breath, wheezing and stridor.   Cardiovascular: Negative for chest pain, palpitations and leg swelling.  Gastrointestinal: Positive for diarrhea. Negative for nausea, vomiting and abdominal pain.  Genitourinary: Negative.   Skin: Positive for wound.  Neurological: Negative for dizziness, seizures, syncope, facial asymmetry, speech difficulty, light-headedness and numbness.  Psychiatric/Behavioral: Negative.     Allergies  Keflex; Clindamycin/lincomycin; Latex; Sulfa antibiotics; and Sulfur  Home Medications   Prior to Admission medications   Medication Sig Start Date End Date Taking? Authorizing Provider  acetaminophen-codeine (TYLENOL #3) 300-30 MG per tablet TAKE 1 TO 2 TABLETS BY MOUTH EVERY 6 HOURS AS NEEDED FOR MODERATE PAIN 06/26/15   Bartholomew Crews, MD  amLODipine (NORVASC) 10 MG tablet TAKE 1 TABLET BY MOUTH EVERY DAY 03/27/15   Jessee Avers, MD  carbamide peroxide (DEBROX) 6.5 % otic solution Place 5 drops into both ears 2  (two) times daily. 02/07/15   Blain Pais, MD  diclofenac sodium (VOLTAREN) 1 % GEL Apply 4 g topically 4 (four) times daily. 11/10/14   Ejiroghene Arlyce Dice, MD  dicyclomine (BENTYL) 10 MG capsule Take 1 capsule (10 mg total) by mouth 4 (four) times daily -  before meals and at bedtime. 02/07/15   Blain Pais, MD  EASY TOUCH INSULIN SYRINGE 31G X 5/16" 1 ML MISC USE AS DIRECTED TWICE DAILY 03/04/15   Madilyn Fireman, MD  gabapentin (NEURONTIN) 300 MG capsule TAKE 1 CAPSULE BY MOUTH THREE TIMES A DAY 10/31/14   Jessee Avers, MD  hydrOXYzine (ATARAX/VISTARIL) 25 MG tablet Take 1 tablet (25 mg total) by mouth 3 (three) times daily as needed for itching. Patient not taking: Reported on 07/17/2015 04/17/15   Audelia Hives Presson, PA  insulin NPH-regular Human (NOVOLIN 70/30) (70-30) 100 UNIT/ML injection Inject 50 Units into the skin 2 (two) times daily with a meal. 05/22/15   Jessee Avers, MD  loratadine (CLARITIN) 10 MG tablet Take 1 tablet (10 mg total) by mouth daily. 08/19/15 08/18/16  [name], MD  losartan-hydrochlorothiazide (HYZAAR) 100-25 MG per tablet Take 1 tablet by mouth daily. 04/08/15   Jessee Avers, MD  metFORMIN (GLUCOPHAGE) 1000 MG tablet Take 1 tablet (1,000 mg total) by mouth 2 (two) times daily with a meal. 04/08/15   Jessee Avers, MD  nystatin-triamcinolone ointment [facility]) Apply 1 application topically 2 (two) times daily. Patient not taking: Reported on 07/17/2015 02/07/15   Blain Pais, MD  omeprazole (PRILOSEC) 20 MG capsule TAKE 1 CAPSULE BY MOUTH EVERY DAY Patient not taking: Reported on 07/17/2015 05/02/15   Annia Belt, MD  pravastatin (PRAVACHOL) 40 MG tablet TAKE 1 TABLET BY MOUTH EVERY EVENING 01/30/15   Jessee Avers, MD  rivaroxaban (XARELTO) 20 MG TABS tablet Take 1 tablet (20 mg total) by mouth daily with supper. 06/19/15   Jessee Avers, MD  SANTYL ointment APPLY TO AFFECTED AREA DAILY Patient not taking: Reported on  07/17/2015 03/22/15   Jessee Avers, MD  traMADol (ULTRAM) 50 MG tablet Take 1 tablet (50 mg total) by mouth every 8 (eight) hours as needed. 07/17/15 07/16/16  [name], MD  traZODone (DESYREL) 150 MG tablet TAKE 1 TABLET BY MOUTH EVERY NIGHT AT BEDTIME 07/17/15   Annia Belt, MD   Meds Ordered and Administered this Visit  Medications - No data to display  BP 163/99 mmHg  Pulse 83  Temp(Src) 97.6 F (36.4 C) (Oral)  Resp 16  SpO2 99% No data found.   Physical Exam  Constitutional: She is oriented to person, place, and time. She appears well-developed and well-nourished. No distress.  HENT:  Head: Normocephalic and atraumatic.  Bilateral TMs are normal. Oropharynx clear and moist. No exudates. No erythema or swelling.  Eyes: Conjunctivae and EOM are normal.  Neck: Normal range of motion. Neck supple.  Cardiovascular:  Normal rate, regular rhythm, normal heart sounds and intact distal pulses.   Pulmonary/Chest: Effort normal and breath sounds normal. No respiratory distress. She has no wheezes. She has no rales.  Abdominal: Soft. There is no tenderness.  Musculoskeletal: Normal range of motion. She exhibits no edema or tenderness.  Lymphadenopathy:    She has no cervical adenopathy.  Neurological: She is alert and oriented to person, place, and time. She exhibits normal muscle tone.  Skin: Skin is warm and dry.  The wound to the plantar aspect of the left foot is healing well. There is no signs of infection.  Psychiatric: She has a normal mood and affect.  Nursing note and vitals reviewed.   ED Course  Procedures (including critical care time)  Labs Review Labs Reviewed - No data to display  Imaging Review No results found.   Visual Acuity Review  Right Eye Distance:   Left Eye Distance:   Bilateral Distance:    Right Eye Near:   Left Eye Near:    Bilateral Near:         MDM   1. Viral syndrome    Patient's complaints of feeling cold, myalgias,  minor intermittent headache and minor intermittent right earache sound most consistent with viral etiology. Review of systems otherwise are negative. Physical exam is unremarkable. Treatment plan fluids and stay well-hydrated No direct sign of infection found. Likely a virus.  For worsening or new symptoms go to the ED or see your doctor. Drink plenty of fluids Check blood sugar regularly.    Janne Napoleon, NP 09/05/15 2105

## 2015-09-16 ENCOUNTER — Encounter: Payer: Medicare Other | Admitting: Internal Medicine

## 2015-09-17 ENCOUNTER — Encounter: Payer: Self-pay | Admitting: Internal Medicine

## 2015-09-23 ENCOUNTER — Emergency Department (HOSPITAL_COMMUNITY)
Admission: EM | Admit: 2015-09-23 | Discharge: 2015-09-23 | Disposition: A | Discharge status: Home / Self Care | Payer: Medicare Other | Attending: Emergency Medicine | Admitting: Emergency Medicine

## 2015-09-23 ENCOUNTER — Ambulatory Visit: Payer: Medicare Other | Admitting: Internal Medicine

## 2015-09-23 ENCOUNTER — Encounter (HOSPITAL_COMMUNITY): Payer: Self-pay | Admitting: *Deleted

## 2015-09-23 ENCOUNTER — Emergency Department (HOSPITAL_COMMUNITY): Payer: Medicare Other

## 2015-09-23 DIAGNOSIS — R062 Wheezing: Secondary | ICD-10-CM | POA: Insufficient documentation

## 2015-09-23 DIAGNOSIS — Z794 Long term (current) use of insulin: Secondary | ICD-10-CM | POA: Diagnosis not present

## 2015-09-23 DIAGNOSIS — Z86711 Personal history of pulmonary embolism: Secondary | ICD-10-CM | POA: Diagnosis not present

## 2015-09-23 DIAGNOSIS — Z87440 Personal history of urinary (tract) infections: Secondary | ICD-10-CM | POA: Diagnosis not present

## 2015-09-23 DIAGNOSIS — I10 Essential (primary) hypertension: Secondary | ICD-10-CM | POA: Diagnosis not present

## 2015-09-23 DIAGNOSIS — M7989 Other specified soft tissue disorders: Secondary | ICD-10-CM | POA: Diagnosis not present

## 2015-09-23 DIAGNOSIS — G629 Polyneuropathy, unspecified: Secondary | ICD-10-CM | POA: Insufficient documentation

## 2015-09-23 DIAGNOSIS — Z79899 Other long term (current) drug therapy: Secondary | ICD-10-CM | POA: Insufficient documentation

## 2015-09-23 DIAGNOSIS — G8929 Other chronic pain: Secondary | ICD-10-CM | POA: Insufficient documentation

## 2015-09-23 DIAGNOSIS — R0602 Shortness of breath: Secondary | ICD-10-CM | POA: Diagnosis not present

## 2015-09-23 DIAGNOSIS — Z8701 Personal history of pneumonia (recurrent): Secondary | ICD-10-CM | POA: Diagnosis not present

## 2015-09-23 DIAGNOSIS — R079 Chest pain, unspecified: Secondary | ICD-10-CM | POA: Insufficient documentation

## 2015-09-23 DIAGNOSIS — G43909 Migraine, unspecified, not intractable, without status migrainosus: Secondary | ICD-10-CM | POA: Diagnosis not present

## 2015-09-23 DIAGNOSIS — E119 Type 2 diabetes mellitus without complications: Secondary | ICD-10-CM | POA: Diagnosis not present

## 2015-09-23 DIAGNOSIS — Z91040 Latex allergy status: Secondary | ICD-10-CM | POA: Insufficient documentation

## 2015-09-23 DIAGNOSIS — Z87442 Personal history of urinary calculi: Secondary | ICD-10-CM | POA: Insufficient documentation

## 2015-09-23 DIAGNOSIS — R059 Cough, unspecified: Secondary | ICD-10-CM

## 2015-09-23 DIAGNOSIS — F329 Major depressive disorder, single episode, unspecified: Secondary | ICD-10-CM | POA: Insufficient documentation

## 2015-09-23 DIAGNOSIS — M1990 Unspecified osteoarthritis, unspecified site: Secondary | ICD-10-CM | POA: Diagnosis not present

## 2015-09-23 DIAGNOSIS — Z720 Tobacco use: Secondary | ICD-10-CM | POA: Diagnosis not present

## 2015-09-23 DIAGNOSIS — E7800 Pure hypercholesterolemia, unspecified: Secondary | ICD-10-CM | POA: Diagnosis not present

## 2015-09-23 DIAGNOSIS — Z862 Personal history of diseases of the blood and blood-forming organs and certain disorders involving the immune mechanism: Secondary | ICD-10-CM | POA: Diagnosis not present

## 2015-09-23 DIAGNOSIS — R05 Cough: Secondary | ICD-10-CM | POA: Diagnosis not present

## 2015-09-23 LAB — BASIC METABOLIC PANEL
Anion gap: 8 (ref 5–15)
BUN: 12 mg/dL (ref 6–20)
CALCIUM: 8.5 mg/dL — AB (ref 8.9–10.3)
CO2: 25 mmol/L (ref 22–32)
CREATININE: 1.59 mg/dL — AB (ref 0.44–1.00)
Chloride: 104 mmol/L (ref 101–111)
GFR calc non Af Amer: 33 mL/min — ABNORMAL LOW (ref >60)
GFR, EST AFRICAN AMERICAN: 38 mL/min — AB (ref >60)
GLUCOSE: 158 mg/dL — AB (ref 65–99)
Potassium: 3.5 mmol/L (ref 3.5–5.1)
Sodium: 137 mmol/L (ref 135–145)

## 2015-09-23 LAB — CBC WITH DIFFERENTIAL/PLATELET
BASOS PCT: 1 %
Basophils Absolute: 0.1 10*3/uL (ref 0.0–0.1)
EOS ABS: 0.3 10*3/uL (ref 0.0–0.7)
Eosinophils Relative: 3 %
HCT: 39.7 % (ref 36.0–46.0)
Hemoglobin: 13 g/dL (ref 12.0–15.0)
LYMPHS ABS: 2.1 10*3/uL (ref 0.7–4.0)
Lymphocytes Relative: 18 %
MCH: 27.3 pg (ref 26.0–34.0)
MCHC: 32.7 g/dL (ref 30.0–36.0)
MCV: 83.4 fL (ref 78.0–100.0)
MONO ABS: 1.1 10*3/uL — AB (ref 0.1–1.0)
Monocytes Relative: 10 %
NEUTROS ABS: 7.8 10*3/uL — AB (ref 1.7–7.7)
NEUTROS PCT: 68 %
PLATELETS: 332 10*3/uL (ref 150–400)
RBC: 4.76 MIL/uL (ref 3.87–5.11)
RDW: 17.3 % — AB (ref 11.5–15.5)
WBC: 11.4 10*3/uL — ABNORMAL HIGH (ref 4.0–10.5)

## 2015-09-23 LAB — BRAIN NATRIURETIC PEPTIDE: B NATRIURETIC PEPTIDE 5: 100 pg/mL (ref 0.0–100.0)

## 2015-09-23 MED ORDER — IBUPROFEN 800 MG PO TABS: 800.0000 mg | ORAL_TABLET | Freq: Three times a day (TID) | ORAL | Status: DC

## 2015-09-23 MED ORDER — HYDROCODONE-HOMATROPINE 5-1.5 MG/5ML PO SYRP: 5.0000 mL | ORAL_SOLUTION | Freq: Four times a day (QID) | ORAL | Status: DC | PRN

## 2015-09-23 MED ORDER — BENZONATATE 100 MG PO CAPS: 100.0000 mg | ORAL_CAPSULE | Freq: Once | ORAL | Status: DC

## 2015-09-23 MED ORDER — BENZONATATE 100 MG PO CAPS
100.0000 mg | ORAL_CAPSULE | Freq: Once | ORAL | Status: AC
  Administered 2015-09-23: 100 mg via ORAL
  Filled 2015-09-23: qty 1

## 2015-09-23 MED ORDER — ALBUTEROL SULFATE HFA 108 (90 BASE) MCG/ACT IN AERS
2.0000 | INHALATION_SPRAY | Freq: Once | RESPIRATORY_TRACT | Status: AC
  Administered 2015-09-23: 2 via RESPIRATORY_TRACT
  Filled 2015-09-23: qty 6.7

## 2015-09-23 MED ORDER — KETOROLAC TROMETHAMINE 30 MG/ML IJ SOLN
15.0000 mg | Freq: Once | INTRAMUSCULAR | Status: AC
  Administered 2015-09-23: 15 mg via INTRAVENOUS
  Filled 2015-09-23: qty 1

## 2015-09-23 MED ORDER — SODIUM CHLORIDE 0.9 % IV BOLUS (SEPSIS)
1000.0000 mL | Freq: Once | INTRAVENOUS | Status: AC
  Administered 2015-09-23: 1000 mL via INTRAVENOUS

## 2015-09-23 MED ORDER — FENTANYL CITRATE (PF) 100 MCG/2ML IJ SOLN
50.0000 ug | Freq: Once | INTRAMUSCULAR | Status: AC
  Administered 2015-09-23: 50 ug via INTRAVENOUS
  Filled 2015-09-23: qty 2

## 2015-09-23 MED ORDER — ALBUTEROL SULFATE HFA 108 (90 BASE) MCG/ACT IN AERS: 2.0000 | INHALATION_SPRAY | Freq: Once | RESPIRATORY_TRACT | Status: DC

## 2015-09-23 MED ORDER — AZITHROMYCIN 250 MG PO TABS: ORAL_TABLET | ORAL | Status: DC

## 2015-09-23 NOTE — ED Notes (Signed)
Pt is here symptoms of cough, aching all over, and coughing up white sputum.  Pt reports fever.  Pt complains of pain running up left arm.  Pt reports sob. Mask on at triage

## 2015-09-23 NOTE — ED Provider Notes (Signed)
CSN: [id number]     Arrival date & time 09/23/15  1235 History   First MD Initiated Contact with Patient 09/23/15 1608     Chief Complaint  Patient presents with  . Shortness of Breath  . Cough  . Generalized Body Aches     (Consider location/radiation/quality/duration/timing/severity/associated sxs/prior Treatment) Patient is a 64 y.o. female presenting with cough.  Cough Cough characteristics:  Non-productive Severity:  Mild Onset quality:  Gradual Duration:  3 days Timing:  Constant Progression:  Worsening Chronicity:  New Smoker: no   Relieved by:  None tried Worsened by:  Nothing tried Ineffective treatments:  None tried Associated symptoms: chest pain (sharp pain when coughing) and shortness of breath   Associated symptoms: no myalgias and no rash     Past Medical History  Diagnosis Date  . Hypertension   . Pulmonary embolism ([place])     2011 treated at [facility] in [place].  Was on Coumadin for over a year.  no known family history  . UTI (urinary tract infection)   . High cholesterol   . 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 Depression   . Neuropathy ([facility])     feet  . Kidney stones   . Pneumonia     "several times" (07/26/2014)  . Type II diabetes mellitus ([place]) dx'd 2000  . Daily headache   . Migraine     "last one was in the 1990's" (07/26/2014)  . Arthritis     "knees" (07/26/2014)  . Chronic back pain   . Cataract of both eyes    Past Surgical History  Procedure Laterality Date  . Cholecystectomy      1980  . Esophagogastroduodenoscopy  08/19/2012    Procedure: ESOPHAGOGASTRODUODENOSCOPY (EGD);  Surgeon: Winfield Cunas., MD;  Location: [facility] ENDOSCOPY;  Service: Endoscopy;  Laterality: N/A;  . Cesarean section  1982  . Carpal tunnel release Bilateral   . Tonsillectomy    . Artery biopsy Right 05/09/2014    Procedure: BIOPSY TEMPORAL ARTERY;  Surgeon: Rosetta Posner, MD;  Location: [place];  Service: Vascular;  Laterality: Right;  . Appendectomy     . Vaginal hysterectomy    . Dilation and curettage of uterus  1982  . Cystoscopy w/ stone manipulation    . Breast biopsy Left   . Temporal artery biopsy / ligation Right 04/2014  . Eye surgery Bilateral   . Refractive surgery Bilateral    Family History  Problem Relation Age of Onset  . Adopted: Yes  . Hypertension Mother    Social History  Substance Use Topics  . Smoking status: Current Every Day Smoker -- 0.25 packs/day for 44 years    Types: Cigarettes  . Smokeless tobacco: Never Used     Comment: 1/2 PPD  . Alcohol Use: No   OB History    No data available     Review of Systems  Respiratory: Positive for cough and shortness of breath.   Cardiovascular: Positive for chest pain (sharp pain when coughing) and leg swelling (chronic).  Musculoskeletal: Negative for myalgias.  Skin: Negative for rash.  All other systems reviewed and are negative.     Allergies  Keflex; Clindamycin/lincomycin; Latex; Sulfa antibiotics; and Sulfur  Home Medications   Prior to Admission medications   Medication Sig Start Date End Date Taking? Authorizing Provider  acetaminophen-codeine (TYLENOL #3) 300-30 MG per tablet TAKE 1 TO 2 TABLETS BY MOUTH EVERY 6 HOURS AS NEEDED  FOR MODERATE PAIN 06/26/15   Bartholomew Crews, MD  amLODipine (NORVASC) 10 MG tablet TAKE 1 TABLET BY MOUTH EVERY DAY 03/27/15   Jessee Avers, MD  carbamide peroxide [facility]) 6.5 % otic solution Place 5 drops into both ears 2 (two) times daily. 02/07/15   Blain Pais, MD  diclofenac sodium (VOLTAREN) 1 % GEL Apply 4 g topically 4 (four) times daily. 11/10/14   Ejiroghene Arlyce Dice, MD  dicyclomine (BENTYL) 10 MG capsule Take 1 capsule (10 mg total) by mouth 4 (four) times daily -  before meals and at bedtime. 02/07/15   Blain Pais, MD  EASY TOUCH INSULIN SYRINGE 31G X 5/16" 1 ML MISC USE AS DIRECTED TWICE DAILY 03/04/15   Madilyn Fireman, MD  gabapentin (NEURONTIN) 300 MG capsule TAKE 1 CAPSULE BY MOUTH  THREE TIMES A DAY 10/31/14   Jessee Avers, MD  hydrOXYzine (ATARAX/VISTARIL) 25 MG tablet Take 1 tablet (25 mg total) by mouth 3 (three) times daily as needed for itching. Patient not taking: Reported on 07/17/2015 04/17/15   Audelia Hives Presson, PA  insulin NPH-regular Human (NOVOLIN 70/30) (70-30) 100 UNIT/ML injection Inject 50 Units into the skin 2 (two) times daily with a meal. 05/22/15   Jessee Avers, MD  loratadine (CLARITIN) 10 MG tablet Take 1 tablet (10 mg total) by mouth daily. 08/19/15 08/18/16  [name], MD  losartan-hydrochlorothiazide (HYZAAR) 100-25 MG per tablet Take 1 tablet by mouth daily. 04/08/15   Jessee Avers, MD  metFORMIN (GLUCOPHAGE) 1000 MG tablet Take 1 tablet (1,000 mg total) by mouth 2 (two) times daily with a meal. 04/08/15   Jessee Avers, MD  nystatin-triamcinolone ointment [facility]) Apply 1 application topically 2 (two) times daily. Patient not taking: Reported on 07/17/2015 02/07/15   Blain Pais, MD  omeprazole (PRILOSEC) 20 MG capsule TAKE 1 CAPSULE BY MOUTH EVERY DAY Patient not taking: Reported on 07/17/2015 05/02/15   Annia Belt, MD  pravastatin (PRAVACHOL) 40 MG tablet TAKE 1 TABLET BY MOUTH EVERY EVENING 01/30/15   Jessee Avers, MD  rivaroxaban (XARELTO) 20 MG TABS tablet Take 1 tablet (20 mg total) by mouth daily with supper. 06/19/15   Jessee Avers, MD  SANTYL ointment APPLY TO AFFECTED AREA DAILY Patient not taking: Reported on 07/17/2015 03/22/15   Jessee Avers, MD  traMADol (ULTRAM) 50 MG tablet Take 1 tablet (50 mg total) by mouth every 8 (eight) hours as needed. 07/17/15 07/16/16  [name], MD  traZODone (DESYREL) 150 MG tablet TAKE 1 TABLET BY MOUTH EVERY NIGHT AT BEDTIME 07/17/15   Annia Belt, MD   BP 170/82 mmHg  Pulse 87  Temp(Src) 98.8 F (37.1 C) (Oral)  Resp 22  Ht 5\' 9"  (1.753 m)  Wt 237 lb (107.502 kg)  BMI 34.98 kg/m2  SpO2 99% Physical Exam  Constitutional: She is oriented to person, place,  and time. She appears well-developed and well-nourished.  HENT:  Head: Normocephalic and atraumatic.  Eyes: Conjunctivae and EOM are normal. Right eye exhibits no discharge. Left eye exhibits no discharge.  Cardiovascular: Normal rate and regular rhythm.   Pulmonary/Chest: Effort normal. No respiratory distress. She has wheezes (slight). Rales: minimal in bases.  Abdominal: Soft. She exhibits no distension. There is no tenderness. There is no rebound.  Musculoskeletal: Normal range of motion. She exhibits no edema or tenderness.  Neurological: She is alert and oriented to person, place, and time.  Skin: Skin is warm and dry.  Nursing note and vitals reviewed.  ED Course  Procedures (including critical care time) Labs Review Labs Reviewed  BASIC METABOLIC PANEL - Abnormal; Notable for the following:    Glucose, Bld 158 (*)    Creatinine, Ser 1.59 (*)    Calcium 8.5 (*)    GFR calc non Af Amer 33 (*)    GFR calc Af Amer 38 (*)    All other components within normal limits  CBC WITH DIFFERENTIAL/PLATELET - Abnormal; Notable for the following:    WBC 11.4 (*)    RDW 17.3 (*)    Neutro Abs 7.8 (*)    Monocytes Absolute 1.1 (*)    All other components within normal limits  BRAIN NATRIURETIC PEPTIDE    Imaging Review Dg Chest 2 View  09/23/2015   CLINICAL DATA:  Acute shortness of breath, cough and fever  EXAM: CHEST  2 VIEW  COMPARISON:  09/16/2014  FINDINGS: Stable mild cardiomegaly with central vascular congestion. Bilateral subpleural midlung minor scarring. No superimposed pneumonia, collapse or consolidation. Negative for edema, effusion, or pneumothorax. Trachea midline. Degenerative endplate changes of the spine. Right shoulder degenerative joint disease as well. No significant interval change.  IMPRESSION: Cardiomegaly with vascular congestion  Bilateral mid lung scarring  No superimposed acute process   Electronically Signed   By: Jerilynn Mages.  Shick M.D.   On: 09/23/2015 14:17   I have  personally reviewed and evaluated these images and lab results as part of my medical decision-making.   EKG Interpretation None      MDM   Final diagnoses:  Cough   Bronchitis v CHF v other viral illness. Will tx symptomatically.   Symptoms improved. XR negative. Will start on abx 2/2 age, productive cough and leukocytosis.   I have personally and contemperaneously reviewed labs and imaging and used in my decision making as above.   A medical screening exam was performed and I feel the patient has had an appropriate workup for their chief complaint at this time and likelihood of emergent condition existing is low. They have been counseled on decision, discharge, follow up and which symptoms necessitate immediate return to the emergency department. They or their family verbally stated understanding and agreement with plan and discharged in stable condition.      Merrily Pew, MD 09/23/15 [phone]

## 2015-09-24 ENCOUNTER — Other Ambulatory Visit: Payer: Self-pay | Admitting: Internal Medicine

## 2015-09-24 ENCOUNTER — Other Ambulatory Visit: Payer: Self-pay | Admitting: Oncology

## 2015-09-25 ENCOUNTER — Encounter: Payer: Self-pay | Admitting: Internal Medicine

## 2015-09-26 ENCOUNTER — Ambulatory Visit: Payer: Medicare Other | Admitting: Gastroenterology

## 2015-09-26 NOTE — Addendum Note (Signed)
Addended by: Hulan Fray on: 09/26/2015 08:13 PM   Modules accepted: Orders

## 2015-09-30 ENCOUNTER — Ambulatory Visit: Payer: Medicare Other | Admitting: Internal Medicine

## 2015-09-30 ENCOUNTER — Encounter: Payer: Self-pay | Admitting: Internal Medicine

## 2015-10-04 ENCOUNTER — Ambulatory Visit (INDEPENDENT_AMBULATORY_CARE_PROVIDER_SITE_OTHER): Payer: Medicare Other | Admitting: Internal Medicine

## 2015-10-04 DIAGNOSIS — R1031 Right lower quadrant pain: Secondary | ICD-10-CM | POA: Diagnosis not present

## 2015-10-04 DIAGNOSIS — J069 Acute upper respiratory infection, unspecified: Secondary | ICD-10-CM

## 2015-10-04 DIAGNOSIS — B9789 Other viral agents as the cause of diseases classified elsewhere: Secondary | ICD-10-CM

## 2015-10-04 MED ORDER — DICYCLOMINE HCL 20 MG PO TABS: 20.0000 mg | ORAL_TABLET | Freq: Four times a day (QID) | ORAL | Status: DC

## 2015-10-04 MED ORDER — BENZONATATE 100 MG PO CAPS: 100.0000 mg | ORAL_CAPSULE | Freq: Three times a day (TID) | ORAL | Status: DC | PRN

## 2015-10-04 MED ORDER — GUAIFENESIN ER 600 MG PO TB12: 600.0000 mg | ORAL_TABLET | Freq: Two times a day (BID) | ORAL | Status: DC

## 2015-10-04 NOTE — Patient Instructions (Addendum)
Joy Butler it was nice meeting you today.  -Take Dicyclomine 20 mg: 1 capsule by mouth 4 times daily for your belly problem.  -Please call Gastroenterology (stomach doctors) office to schedule an appointment.   -Take Tessalon pearls: 1 capsule by mouth 3 times daily as needed for cough.  -Take Mucinex 600 mg: 1 tablet by mouth every 12 hours as needed for congestion.   -Gargle with warm salt water to help with your sore throat.  -If your symptoms do not get better OR you develop fevers, chills, or trouble breathing please call us immediately!  -Return for a follow-up visit in 1 month.   -Have a wonderful day!

## 2015-10-06 NOTE — Assessment & Plan Note (Signed)
Patient complaining of cough productive of clear sputum, sore throat, nasal congestion, and sneezing for the past few days. Denies any fevers, chills, nausea, vomiting, or myalgias. Physical examination showing slightly erythematous oropharynx, however, no exudate. Lungs clear to auscultation bilaterally. Patient is afebrile. Her symptoms are likely due to a viral upper respiratory tract infection. -Symptomatic treatment: Mucinex for congestion and benzonatate 100 mg 1 capsule 3 times daily as needed for cough.

## 2015-10-06 NOTE — Progress Notes (Signed)
Patient ID: Joy Butler, female   DOB: [birth year], 64 y.o.   MRN: [id number]   Subjective:   Patient ID: Joy Butler female   DOB: [birth year] 64 y.o.   MRN: [id number]  HPI: [name] is a 64 y.o. female with a past medical history of conditions listed below presenting to clinic for a follow-up of her chronic abdominal pain. Patient is also complaining of a cough productive of clear sputum, sore throat, nasal congestion, and sneezing for the past few days. Denies any fevers, chills, shortness of breath, or myalgias. Please see assessment and plan for the status of the patient's chronic medical conditions.    Past Medical History  Diagnosis Date  . Hypertension   . Pulmonary embolism ([place])     2011 treated at [facility] in [place].  Was on Coumadin for over a year.  no known family history  . UTI (urinary tract infection)   . High cholesterol   . 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 Depression   . Neuropathy ([facility])     feet  . Kidney stones   . Pneumonia     "several times" (07/26/2014)  . Type II diabetes mellitus ([place]) dx'd 2000  . Daily headache   . Migraine     "last one was in the 1990's" (07/26/2014)  . Arthritis     "knees" (07/26/2014)  . Chronic back pain   . Cataract of both eyes    Current Outpatient Prescriptions  Medication Sig Dispense Refill  . albuterol (PROVENTIL HFA;VENTOLIN HFA) 108 (90 BASE) MCG/ACT inhaler Inhale 2 puffs into the lungs once.    Marland Kitchen amLODipine (NORVASC) 10 MG tablet TAKE 1 TABLET BY MOUTH EVERY DAY 60 tablet 2  . azithromycin (ZITHROMAX Z-PAK) 250 MG tablet 2 po day one, then 1 daily x 4 days 5 tablet 0  . benzonatate (TESSALON) 100 MG capsule Take 1 capsule (100 mg total) by mouth 3 (three) times daily as needed for cough. 21 capsule 0  . dicyclomine (BENTYL) 20 MG tablet Take 1 tablet (20 mg total) by mouth 4 (four) times daily. 120 tablet 0  . gabapentin (NEURONTIN) 300 MG capsule TAKE 1 CAPSULE BY MOUTH THREE TIMES A  DAY 180 capsule 2  . guaiFENesin (MUCINEX) 600 MG 12 hr tablet Take 1 tablet (600 mg total) by mouth 2 (two) times daily. 14 tablet 0  . HYDROcodone-homatropine (HYCODAN) 5-1.5 MG/5ML syrup Take 5 mLs by mouth every 6 (six) hours as needed for cough. 50 mL 0  . ibuprofen (ADVIL,MOTRIN) 800 MG tablet Take 1 tablet (800 mg total) by mouth 3 (three) times daily. 21 tablet 0  . insulin NPH-regular Human (NOVOLIN 70/30) (70-30) 100 UNIT/ML injection Inject 50 Units into the skin 2 (two) times daily with a meal. 30 mL 12  . loratadine (CLARITIN) 10 MG tablet Take 1 tablet (10 mg total) by mouth daily. 30 tablet 2  . losartan-hydrochlorothiazide (HYZAAR) 100-25 MG per tablet Take 1 tablet by mouth daily. 90 tablet 2  . metFORMIN (GLUCOPHAGE) 1000 MG tablet Take 1 tablet (1,000 mg total) by mouth 2 (two) times daily with a meal. 60 tablet 6  . omeprazole (PRILOSEC) 20 MG capsule TAKE 1 CAPSULE BY MOUTH EVERY DAY 30 capsule 3  . pravastatin (PRAVACHOL) 40 MG tablet TAKE 1 TABLET BY MOUTH EVERY EVENING 90 tablet 3  . rivaroxaban (XARELTO) 20 MG TABS tablet Take 1 tablet (20 mg total) by mouth daily  with supper. 30 tablet 3  . traZODone (DESYREL) 150 MG tablet TAKE 1 TABLET BY MOUTH EVERY NIGHT AT BEDTIME 30 tablet 1   No current facility-administered medications for this visit.   Family History  Problem Relation Age of Onset  . Adopted: Yes  . Hypertension Mother    Social History   Social History  . Marital Status: Single    Spouse Name: N/A  . Number of Children: N/A  . Years of Education: N/A   Social History Main Topics  . Smoking status: Current Every Day Smoker -- 0.25 packs/day for 44 years    Types: Cigarettes  . Smokeless tobacco: Never Used     Comment: 1/2 PPD  . Alcohol Use: No  . Drug Use: No  . Sexual Activity: Yes   Other Topics Concern  . Not on file   Social History Narrative   Previously divorced now engaged with her partner of 4 years.  Has 6 grown children with 21  grandchildren.  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  Moved to [place] in August 2013 and was transiently homeless until first part of October 2013.     Review of Systems: Review of Systems  Constitutional: Negative for fever and chills.  HENT: Positive for congestion and sore throat.   Eyes: Negative for blurred vision and pain.  Respiratory: Positive for cough and sputum production. Negative for shortness of breath and wheezing.   Cardiovascular: Negative for chest pain, palpitations and leg swelling.  Gastrointestinal: Positive for abdominal pain and diarrhea. Negative for nausea, vomiting, constipation, blood in stool and melena.  Musculoskeletal: Negative for myalgias.  Skin: Negative for itching and rash.  Neurological: Negative for sensory change, focal weakness and headaches.   Objective:  Physical Exam: There were no vitals filed for this visit. Physical Exam  Constitutional: She is oriented to person, place, and time. She appears well-developed and well-nourished. No distress.  HENT:  Head: Normocephalic and atraumatic.  Mouth/Throat: No oropharyngeal exudate.  Oropharynx mildly erythematous   Eyes: EOM are normal. Pupils are equal, round, and reactive to light.  Neck: Neck supple.  Cardiovascular: Normal rate, regular rhythm and intact distal pulses.   Pulmonary/Chest: Effort normal. No respiratory distress. She has no wheezes. She has no rales.  Abdominal: Soft. Bowel sounds are normal. She exhibits no distension. There is tenderness. There is no rebound and no guarding.  Hypoactive BS RLQ mildly TTP  Musculoskeletal: She exhibits no edema.  Lymphadenopathy:    She has no cervical adenopathy.  Neurological: She is alert and oriented to person, place, and time.  Skin: Skin is warm and dry.   Assessment & Plan:

## 2015-10-06 NOTE — Assessment & Plan Note (Addendum)
During her previous visit patient was given a referral to GI . States she missed her appointment with GI earlier this month. She is complaining of diffuse abdominal pain which has been worse in the past 2 weeks. Patient also reports having chronic diarrhea, 4 episodes every week and states dicyclomine helps with the diarrhea. Denies any fevers, chills, nausea, vomiting, or constipation. Denies any hematemesis, hematochezia, or melena. As per patient, her symptoms are worse with "red-colored" foods such as spaghetti and cool aid. Symptoms better when she drinks soda. Physical examination remarkable for hypoactive bowel sounds and mild tenderness to palpation in the right lower quadrant of the abdomen. No rebound, rigidity, or guarding. Patient's symptoms are likely due to IBS.  -Dose of dicyclomine increased to 20 mg 4 times daily. -Patient asked to call GI and make another appointment.

## 2015-10-07 ENCOUNTER — Telehealth: Payer: Self-pay | Admitting: Licensed Clinical Social Worker

## 2015-10-07 NOTE — Progress Notes (Signed)
Internal Medicine Clinic Attending  I saw and evaluated the patient.  I personally confirmed the key portions of the history and exam documented by Dr. Rathore and I reviewed pertinent patient test results.  The assessment, diagnosis, and plan were formulated together and I agree with the documentation in the resident's note.  

## 2015-10-07 NOTE — Telephone Encounter (Signed)
CSW received call from pt requesting assistance with bed bug extermination.  Pt states current cost is $1000 and she does not have the funds available.  CSW has previously explored resources without success.  Pt states she has tried all attempts on her own but still has infestation.  CSW informed Ms. Storer, would inquire with local CSW regarding resources.  CSW placed call to [place] Department, message left.

## 2015-10-08 NOTE — Telephone Encounter (Signed)
CSW received call back from [facility], no resources available but referred to [employer] and/or [employer].  CSW placed call to [name], pt notified unable to obtain extermination assistance but provided pt with the numbers to [employer], [employer], and [place] for [employer] or assistance.  Pt voiced appreciation.

## 2015-10-14 ENCOUNTER — Encounter: Payer: Self-pay | Admitting: Pharmacist

## 2015-10-16 ENCOUNTER — Other Ambulatory Visit: Payer: Self-pay | Admitting: Internal Medicine

## 2015-10-22 ENCOUNTER — Other Ambulatory Visit: Payer: Self-pay | Admitting: Internal Medicine

## 2015-10-23 NOTE — Telephone Encounter (Signed)
Appt with PCP this month. Need for med needs to be addressed at that appt.

## 2015-10-23 NOTE — Telephone Encounter (Signed)
rx phoned in

## 2015-11-04 ENCOUNTER — Encounter: Payer: Medicare Other | Admitting: Internal Medicine

## 2015-11-05 ENCOUNTER — Encounter: Payer: Self-pay | Admitting: Student

## 2015-11-19 ENCOUNTER — Other Ambulatory Visit: Payer: Self-pay | Admitting: Oncology

## 2015-12-03 ENCOUNTER — Observation Stay (HOSPITAL_COMMUNITY): Payer: Medicare Other

## 2015-12-03 ENCOUNTER — Encounter (HOSPITAL_COMMUNITY): Payer: Self-pay

## 2015-12-03 ENCOUNTER — Emergency Department (HOSPITAL_COMMUNITY): Payer: Medicare Other

## 2015-12-03 ENCOUNTER — Inpatient Hospital Stay (HOSPITAL_COMMUNITY)
Admission: EM | Admit: 2015-12-03 | Discharge: 2015-12-06 | DRG: 305 | Disposition: A | Discharge status: Home / Self Care | Payer: Medicare Other | Attending: Student in an Organized Health Care Education/Training Program | Admitting: Student in an Organized Health Care Education/Training Program

## 2015-12-03 DIAGNOSIS — Z86711 Personal history of pulmonary embolism: Secondary | ICD-10-CM

## 2015-12-03 DIAGNOSIS — R7989 Other specified abnormal findings of blood chemistry: Secondary | ICD-10-CM | POA: Diagnosis not present

## 2015-12-03 DIAGNOSIS — Z7901 Long term (current) use of anticoagulants: Secondary | ICD-10-CM

## 2015-12-03 DIAGNOSIS — K2950 Unspecified chronic gastritis without bleeding: Secondary | ICD-10-CM | POA: Diagnosis present

## 2015-12-03 DIAGNOSIS — R5381 Other malaise: Secondary | ICD-10-CM

## 2015-12-03 DIAGNOSIS — E1142 Type 2 diabetes mellitus with diabetic polyneuropathy: Secondary | ICD-10-CM | POA: Diagnosis present

## 2015-12-03 DIAGNOSIS — I160 Hypertensive urgency: Secondary | ICD-10-CM

## 2015-12-03 DIAGNOSIS — E46 Unspecified protein-calorie malnutrition: Secondary | ICD-10-CM | POA: Diagnosis not present

## 2015-12-03 DIAGNOSIS — R51 Headache: Secondary | ICD-10-CM

## 2015-12-03 DIAGNOSIS — D120 Benign neoplasm of cecum: Secondary | ICD-10-CM | POA: Diagnosis present

## 2015-12-03 DIAGNOSIS — E11649 Type 2 diabetes mellitus with hypoglycemia without coma: Secondary | ICD-10-CM | POA: Diagnosis present

## 2015-12-03 DIAGNOSIS — F17210 Nicotine dependence, cigarettes, uncomplicated: Secondary | ICD-10-CM | POA: Diagnosis present

## 2015-12-03 DIAGNOSIS — R634 Abnormal weight loss: Secondary | ICD-10-CM | POA: Diagnosis present

## 2015-12-03 DIAGNOSIS — R519 Headache, unspecified: Secondary | ICD-10-CM

## 2015-12-03 DIAGNOSIS — Z9114 Patient's other noncompliance with medication regimen: Secondary | ICD-10-CM

## 2015-12-03 DIAGNOSIS — R1031 Right lower quadrant pain: Secondary | ICD-10-CM | POA: Diagnosis not present

## 2015-12-03 DIAGNOSIS — E876 Hypokalemia: Secondary | ICD-10-CM | POA: Diagnosis present

## 2015-12-03 DIAGNOSIS — Z91040 Latex allergy status: Secondary | ICD-10-CM

## 2015-12-03 DIAGNOSIS — E785 Hyperlipidemia, unspecified: Secondary | ICD-10-CM | POA: Diagnosis present

## 2015-12-03 DIAGNOSIS — Z881 Allergy status to other antibiotic agents status: Secondary | ICD-10-CM

## 2015-12-03 DIAGNOSIS — E162 Hypoglycemia, unspecified: Secondary | ICD-10-CM | POA: Diagnosis not present

## 2015-12-03 DIAGNOSIS — Z8249 Family history of ischemic heart disease and other diseases of the circulatory system: Secondary | ICD-10-CM

## 2015-12-03 DIAGNOSIS — G8929 Other chronic pain: Secondary | ICD-10-CM | POA: Diagnosis present

## 2015-12-03 DIAGNOSIS — Z794 Long term (current) use of insulin: Secondary | ICD-10-CM

## 2015-12-03 DIAGNOSIS — M549 Dorsalgia, unspecified: Secondary | ICD-10-CM | POA: Diagnosis present

## 2015-12-03 DIAGNOSIS — Z888 Allergy status to other drugs, medicaments and biological substances status: Secondary | ICD-10-CM

## 2015-12-03 DIAGNOSIS — E230 Hypopituitarism: Secondary | ICD-10-CM

## 2015-12-03 DIAGNOSIS — R778 Other specified abnormalities of plasma proteins: Secondary | ICD-10-CM

## 2015-12-03 DIAGNOSIS — N179 Acute kidney failure, unspecified: Secondary | ICD-10-CM | POA: Diagnosis present

## 2015-12-03 DIAGNOSIS — Z833 Family history of diabetes mellitus: Secondary | ICD-10-CM

## 2015-12-03 DIAGNOSIS — D124 Benign neoplasm of descending colon: Secondary | ICD-10-CM | POA: Diagnosis present

## 2015-12-03 DIAGNOSIS — I161 Hypertensive emergency: Secondary | ICD-10-CM

## 2015-12-03 DIAGNOSIS — F329 Major depressive disorder, single episode, unspecified: Secondary | ICD-10-CM | POA: Diagnosis present

## 2015-12-03 DIAGNOSIS — Z79899 Other long term (current) drug therapy: Secondary | ICD-10-CM

## 2015-12-03 HISTORY — DX: Hypertensive emergency: I16.1

## 2015-12-03 LAB — URINALYSIS, ROUTINE W REFLEX MICROSCOPIC
Bilirubin Urine: NEGATIVE
GLUCOSE, UA: 100 mg/dL — AB
Ketones, ur: NEGATIVE mg/dL
LEUKOCYTES UA: NEGATIVE
Nitrite: NEGATIVE
Protein, ur: >300 mg/dL — AB
SPECIFIC GRAVITY, URINE: 1.009 (ref 1.005–1.030)
pH: 7 (ref 5.0–8.0)

## 2015-12-03 LAB — HEPATIC FUNCTION PANEL
ALBUMIN: 1.9 g/dL — AB (ref 3.5–5.0)
ALK PHOS: 121 U/L (ref 38–126)
ALT: 12 U/L — ABNORMAL LOW (ref 14–54)
AST: 17 U/L (ref 15–41)
BILIRUBIN TOTAL: 0.5 mg/dL (ref 0.3–1.2)
Bilirubin, Direct: 0.1 mg/dL (ref 0.1–0.5)
Indirect Bilirubin: 0.4 mg/dL (ref 0.3–0.9)
Total Protein: 6 g/dL — ABNORMAL LOW (ref 6.5–8.1)

## 2015-12-03 LAB — GLUCOSE, CAPILLARY
GLUCOSE-CAPILLARY: 83 mg/dL (ref 65–99)
Glucose-Capillary: 166 mg/dL — ABNORMAL HIGH (ref 65–99)
Glucose-Capillary: 96 mg/dL (ref 65–99)
Glucose-Capillary: 151 mg/dL — ABNORMAL HIGH (ref 65–99)

## 2015-12-03 LAB — URINE MICROSCOPIC-ADD ON: WBC, UA: NONE SEEN WBC/hpf (ref 0–5)

## 2015-12-03 LAB — CBC WITH DIFFERENTIAL/PLATELET
Basophils Absolute: 0 10*3/uL (ref 0.0–0.1)
Basophils Relative: 0 %
Eosinophils Absolute: 0.2 10*3/uL (ref 0.0–0.7)
Eosinophils Relative: 1 %
HEMATOCRIT: 38.8 % (ref 36.0–46.0)
HEMOGLOBIN: 12.4 g/dL (ref 12.0–15.0)
LYMPHS ABS: 1.5 10*3/uL (ref 0.7–4.0)
LYMPHS PCT: 14 %
MCH: 26.8 pg (ref 26.0–34.0)
MCHC: 32 g/dL (ref 30.0–36.0)
MCV: 83.8 fL (ref 78.0–100.0)
MONOS PCT: 8 %
Monocytes Absolute: 0.9 10*3/uL (ref 0.1–1.0)
NEUTROS ABS: 8 10*3/uL — AB (ref 1.7–7.7)
NEUTROS PCT: 77 %
Platelets: 354 10*3/uL (ref 150–400)
RBC: 4.63 MIL/uL (ref 3.87–5.11)
RDW: 17.3 % — ABNORMAL HIGH (ref 11.5–15.5)
WBC: 10.6 10*3/uL — ABNORMAL HIGH (ref 4.0–10.5)

## 2015-12-03 LAB — CBG MONITORING, ED
GLUCOSE-CAPILLARY: 104 mg/dL — AB (ref 65–99)
GLUCOSE-CAPILLARY: 187 mg/dL — AB (ref 65–99)
GLUCOSE-CAPILLARY: 154 mg/dL — AB (ref 65–99)
Glucose-Capillary: 70 mg/dL (ref 65–99)

## 2015-12-03 LAB — TROPONIN I
TROPONIN I: 0.1 ng/mL — AB (ref <0.031)
Troponin I: 0.27 ng/mL — ABNORMAL HIGH (ref <0.031)
Troponin I: 0.1 ng/mL — ABNORMAL HIGH (ref <0.031)
Troponin I: 0.08 ng/mL — ABNORMAL HIGH (ref <0.031)

## 2015-12-03 LAB — BASIC METABOLIC PANEL
Anion gap: 9 (ref 5–15)
Anion gap: 10 (ref 5–15)
BUN: 9 mg/dL (ref 6–20)
BUN: 8 mg/dL (ref 6–20)
CALCIUM: 8.2 mg/dL — AB (ref 8.9–10.3)
CHLORIDE: 111 mmol/L (ref 101–111)
CO2: 22 mmol/L (ref 22–32)
CO2: 23 mmol/L (ref 22–32)
CREATININE: 1.67 mg/dL — AB (ref 0.44–1.00)
Calcium: 8.3 mg/dL — ABNORMAL LOW (ref 8.9–10.3)
Chloride: 108 mmol/L (ref 101–111)
Creatinine, Ser: 1.62 mg/dL — ABNORMAL HIGH (ref 0.44–1.00)
GFR calc Af Amer: 36 mL/min — ABNORMAL LOW (ref >60)
GFR calc Af Amer: 37 mL/min — ABNORMAL LOW (ref >60)
GFR calc non Af Amer: 31 mL/min — ABNORMAL LOW (ref >60)
GFR, EST NON AFRICAN AMERICAN: 32 mL/min — AB (ref >60)
GLUCOSE: 78 mg/dL (ref 65–99)
Glucose, Bld: 94 mg/dL (ref 65–99)
Potassium: 2.7 mmol/L — CL (ref 3.5–5.1)
Potassium: 2.9 mmol/L — ABNORMAL LOW (ref 3.5–5.1)
Sodium: 142 mmol/L (ref 135–145)
Sodium: 141 mmol/L (ref 135–145)

## 2015-12-03 LAB — LACTATE DEHYDROGENASE: LDH: 189 U/L (ref 98–192)

## 2015-12-03 LAB — NA AND K (SODIUM & POTASSIUM), RAND UR
Potassium Urine: 14 mmol/L
Sodium, Ur: 76 mmol/L

## 2015-12-03 LAB — TSH: TSH: 5.284 u[IU]/mL — AB (ref 0.350–4.500)

## 2015-12-03 MED ORDER — SODIUM CHLORIDE 0.9 % IV BOLUS (SEPSIS)
1000.0000 mL | Freq: Once | INTRAVENOUS | Status: AC
  Administered 2015-12-03: 1000 mL via INTRAVENOUS

## 2015-12-03 MED ORDER — INSULIN ASPART 100 UNIT/ML ~~LOC~~ SOLN
0.0000 [IU] | SUBCUTANEOUS | Status: DC
  Administered 2015-12-03: 2 [IU] via SUBCUTANEOUS
  Administered 2015-12-04 (×2): 1 [IU] via SUBCUTANEOUS

## 2015-12-03 MED ORDER — SODIUM CHLORIDE 0.9 % IV SOLN
INTRAVENOUS | Status: DC
  Administered 2015-12-03: 08:00:00 via INTRAVENOUS
  Administered 2015-12-04: 75 mL/h via INTRAVENOUS
  Administered 2015-12-04 – 2015-12-06 (×2): via INTRAVENOUS

## 2015-12-03 MED ORDER — AMLODIPINE BESYLATE 10 MG PO TABS
10.0000 mg | ORAL_TABLET | Freq: Every day | ORAL | Status: DC
  Administered 2015-12-03 – 2015-12-05 (×3): 10 mg via ORAL
  Filled 2015-12-03 (×3): qty 1

## 2015-12-03 MED ORDER — HYDROCHLOROTHIAZIDE 25 MG PO TABS
25.0000 mg | ORAL_TABLET | Freq: Every day | ORAL | Status: DC
  Administered 2015-12-03 – 2015-12-05 (×3): 25 mg via ORAL
  Filled 2015-12-03 (×3): qty 1

## 2015-12-03 MED ORDER — MAGNESIUM SULFATE 2 GM/50ML IV SOLN
2.0000 g | Freq: Once | INTRAVENOUS | Status: AC
  Administered 2015-12-03: 2 g via INTRAVENOUS
  Filled 2015-12-03: qty 50

## 2015-12-03 MED ORDER — ALBUTEROL SULFATE HFA 108 (90 BASE) MCG/ACT IN AERS
2.0000 | INHALATION_SPRAY | Freq: Once | RESPIRATORY_TRACT | Status: AC
  Administered 2015-12-03: 2 via RESPIRATORY_TRACT
  Filled 2015-12-03: qty 6.7

## 2015-12-03 MED ORDER — HEPARIN SODIUM (PORCINE) 5000 UNIT/ML IJ SOLN
5000.0000 [IU] | Freq: Three times a day (TID) | INTRAMUSCULAR | Status: DC
  Filled 2015-12-03: qty 1

## 2015-12-03 MED ORDER — PROCHLORPERAZINE EDISYLATE 5 MG/ML IJ SOLN
10.0000 mg | Freq: Once | INTRAMUSCULAR | Status: AC
  Administered 2015-12-03: 10 mg via INTRAVENOUS
  Filled 2015-12-03: qty 2

## 2015-12-03 MED ORDER — NICARDIPINE HCL IN NACL 20-0.86 MG/200ML-% IV SOLN
3.0000 mg/h | Freq: Once | INTRAVENOUS | Status: AC
  Administered 2015-12-03: 5 mg/h via INTRAVENOUS
  Filled 2015-12-03: qty 200

## 2015-12-03 MED ORDER — POTASSIUM CHLORIDE 10 MEQ/100ML IV SOLN
10.0000 meq | Freq: Once | INTRAVENOUS | Status: AC
  Administered 2015-12-03: 10 meq via INTRAVENOUS
  Filled 2015-12-03: qty 100

## 2015-12-03 MED ORDER — ACETAMINOPHEN-CODEINE #3 300-30 MG PO TABS
1.0000 | ORAL_TABLET | Freq: Three times a day (TID) | ORAL | Status: DC | PRN
  Administered 2015-12-03 – 2015-12-06 (×6): 2 via ORAL
  Filled 2015-12-03 (×7): qty 2

## 2015-12-03 MED ORDER — ACETAMINOPHEN 325 MG PO TABS
650.0000 mg | ORAL_TABLET | Freq: Four times a day (QID) | ORAL | Status: DC | PRN
  Administered 2015-12-04 – 2015-12-05 (×2): 650 mg via ORAL
  Filled 2015-12-03 (×2): qty 2

## 2015-12-03 MED ORDER — POTASSIUM CHLORIDE CRYS ER 20 MEQ PO TBCR
40.0000 meq | EXTENDED_RELEASE_TABLET | Freq: Once | ORAL | Status: AC
  Administered 2015-12-03: 40 meq via ORAL
  Filled 2015-12-03: qty 2

## 2015-12-03 MED ORDER — LOSARTAN POTASSIUM-HCTZ 100-25 MG PO TABS: 1.0000 | ORAL_TABLET | Freq: Every day | ORAL | Status: DC

## 2015-12-03 MED ORDER — DEXTROSE 50 % IV SOLN
1.0000 | Freq: Once | INTRAVENOUS | Status: AC
  Administered 2015-12-03: 50 mL via INTRAVENOUS
  Filled 2015-12-03: qty 50

## 2015-12-03 MED ORDER — ASPIRIN 81 MG PO CHEW
324.0000 mg | CHEWABLE_TABLET | Freq: Once | ORAL | Status: AC
  Administered 2015-12-03: 324 mg via ORAL
  Filled 2015-12-03: qty 4

## 2015-12-03 MED ORDER — LOSARTAN POTASSIUM 50 MG PO TABS
100.0000 mg | ORAL_TABLET | Freq: Every day | ORAL | Status: DC
  Administered 2015-12-03 – 2015-12-05 (×3): 100 mg via ORAL
  Filled 2015-12-03 (×4): qty 2

## 2015-12-03 MED ORDER — PRAVASTATIN SODIUM 40 MG PO TABS
40.0000 mg | ORAL_TABLET | Freq: Every evening | ORAL | Status: DC
  Administered 2015-12-03 – 2015-12-06 (×4): 40 mg via ORAL
  Filled 2015-12-03 (×4): qty 1

## 2015-12-03 MED ORDER — HYDROMORPHONE HCL 1 MG/ML IJ SOLN
1.0000 mg | Freq: Once | INTRAMUSCULAR | Status: AC
  Administered 2015-12-03: 1 mg via INTRAVENOUS
  Filled 2015-12-03: qty 1

## 2015-12-03 MED ORDER — LABETALOL HCL 5 MG/ML IV SOLN
5.0000 mg | Freq: Once | INTRAVENOUS | Status: AC
  Administered 2015-12-03: 5 mg via INTRAVENOUS
  Filled 2015-12-03: qty 4

## 2015-12-03 MED ORDER — DIPHENHYDRAMINE HCL 50 MG/ML IJ SOLN
25.0000 mg | Freq: Once | INTRAMUSCULAR | Status: AC
  Administered 2015-12-03: 25 mg via INTRAVENOUS
  Filled 2015-12-03: qty 1

## 2015-12-03 MED ORDER — RIVAROXABAN 20 MG PO TABS
20.0000 mg | ORAL_TABLET | Freq: Every day | ORAL | Status: DC
Start: 2015-12-03 — End: 2015-12-05
  Administered 2015-12-03 – 2015-12-04 (×2): 20 mg via ORAL
  Filled 2015-12-03 (×2): qty 1

## 2015-12-03 MED ORDER — DIPHENHYDRAMINE HCL 50 MG/ML IJ SOLN
INTRAMUSCULAR | Status: AC
  Filled 2015-12-03: qty 1

## 2015-12-03 MED ORDER — IOHEXOL 300 MG/ML  SOLN
25.0000 mL | INTRAMUSCULAR | Status: AC
  Administered 2015-12-03 (×2): 25 mL via ORAL

## 2015-12-03 MED ORDER — GABAPENTIN 300 MG PO CAPS
300.0000 mg | ORAL_CAPSULE | Freq: Three times a day (TID) | ORAL | Status: DC
  Administered 2015-12-03 – 2015-12-06 (×10): 300 mg via ORAL
  Filled 2015-12-03 (×11): qty 1

## 2015-12-03 MED ORDER — LABETALOL HCL 5 MG/ML IV SOLN: 5.0000 mg | Freq: Once | INTRAVENOUS | Status: DC

## 2015-12-03 MED ORDER — HYDRALAZINE HCL 20 MG/ML IJ SOLN
5.0000 mg | Freq: Once | INTRAMUSCULAR | Status: AC
Start: 2015-12-03 — End: 2015-12-03
  Administered 2015-12-03: 5 mg via INTRAVENOUS
  Filled 2015-12-03: qty 1

## 2015-12-03 NOTE — ED Notes (Signed)
Admitting at bedside 

## 2015-12-03 NOTE — Progress Notes (Signed)
Subjective: Patient has continued bilateral frontal headache without any other symptoms. She was transitioned to her home oral medications this morning and weaned off the drip, now with pressures in the 140s-160s/80s on home oral meds.  Objective: Vital signs in last 24 hours: Filed Vitals:   12/03/15 0815 12/03/15 0830 12/03/15 0904 12/03/15 1118  BP: 135/84 142/85 164/84 163/87  Pulse: 82 80 89   Temp:   98 F (36.7 C)   TempSrc:   Oral   Resp: 14 19 18    Height:   5\' 9"  (1.753 m)   Weight:   227 lb (102.967 kg)   SpO2: 95% 95% 99% 100%   Weight change:   Intake/Output Summary (Last 24 hours) at 12/03/15 1156 Last data filed at 12/03/15 0640  Gross per 24 hour  Intake      0 ml  Output    700 ml  Net   -700 ml     Gen: Well-appearing, alert and oriented to person, place, and time HEENT: Oropharynx clear without erythema or exudate.  Neck: No cervical LAD, no thyromegaly or nodules, no JVD noted. CV: Normal rate, regular rhythm, no murmurs, rubs, or gallops Pulmonary: Normal effort, CTA bilaterally, no wheezing, rales, or rhonchi Abdominal: Soft, non-tender, non-distended, without rebound, guarding, or masses Extremities: Distal pulses 2+ in upper and lower extremities bilaterally, no tenderness, erythema or edema Skin: No atypical appearing moles. No rashes Neuro: CN II-XII grossly intact, PERRL, no focal neurologic weakness appreciated  Lab Results: Basic Metabolic Panel:  Recent Labs Lab 12/03/15 0200 12/03/15 0546  NA 142 141  K 2.7* 2.9*  CL 111 108  CO2 22 23  GLUCOSE 94 78  BUN 9 8  CREATININE 1.67* 1.62*  CALCIUM 8.3* 8.2*   Liver Function Tests:  Recent Labs Lab 12/03/15 1040  AST 17  ALT 12*  ALKPHOS 121  BILITOT 0.5  PROT 6.0*  ALBUMIN 1.9*   CBC:  Recent Labs Lab 12/03/15 0200  WBC 10.6*  NEUTROABS 8.0*  HGB 12.4  HCT 38.8  MCV 83.8  PLT 354   Cardiac Enzymes:  Recent Labs Lab 12/03/15 0200 12/03/15 0546  TROPONINI  0.27* 0.10*   CBG:  Recent Labs Lab 12/03/15 0212 12/03/15 0546 12/03/15 0618 12/03/15 0654 12/03/15 0910 12/03/15 1118  GLUCAP 104* 70 187* 154* 166* 83   Urine Drug Screen: Drugs of Abuse     Component Value Date/Time   LABOPIA POSITIVE* 10/12/2012 0803   COCAINSCRNUR NONE DETECTED 10/12/2012 0803   LABBENZ NONE DETECTED 10/12/2012 0803   AMPHETMU NONE DETECTED 10/12/2012 0803   THCU NONE DETECTED 10/12/2012 0803   LABBARB NONE DETECTED 10/12/2012 0803    Urinalysis:  Recent Labs Lab 12/03/15 0249  COLORURINE YELLOW  LABSPEC 1.009  PHURINE 7.0  GLUCOSEU 100*  HGBUR SMALL*  BILIRUBINUR NEGATIVE  KETONESUR NEGATIVE  PROTEINUR >300*  NITRITE NEGATIVE  LEUKOCYTESUR NEGATIVE   Studies/Results: Ct Head Wo Contrast  12/03/2015  CLINICAL DATA:  Headache and hypertension. EXAM: CT HEAD WITHOUT CONTRAST TECHNIQUE: Contiguous axial images were obtained from the base of the skull through the vertex without intravenous contrast. COMPARISON:  08/17/2012 FINDINGS: Ventricles and sulci appear symmetrical. No ventricular dilatation. Mild periventricular low-attenuation change likely representing small vessel ischemia. Basal ganglia calcifications. No mass effect or midline shift. No abnormal extra-axial fluid collections. Gray-white matter junctions are distinct. Basal cisterns are not effaced. No evidence of acute intracranial hemorrhage. No depressed skull fractures. Visualized paranasal sinuses and mastoid air cells are  not opacified. Vascular calcifications. IMPRESSION: No acute intracranial abnormalities. Low-attenuation changes in the deep white matter likely small vessel ischemia. Electronically Signed   By: Lucienne Capers M.D.   On: 12/03/2015 03:40   Assessment/Plan: Active Problems:   Hypertensive emergency  Symptomatic hypertension due to medication noncompliance: Patient was found to have BP of 200s/100s, which was improved to 150s/90s on admission. She had headaches,  elevated creatinine, and hypokalemia. She also has LAD on EKG. She is also on home xarelto but does not recall taking it. Other differentials for refractory (which this is not) htn would be secondary causes like RAS, cushings, primary hyperaldo, sleep apnea etc but they are less likely  -On home oral meds now, transitioned from drip -Trend BMP, troponin -Tylenol #3 for headache -Follow-up secondary HTN labs  Hypoglycemia: most likely due to poor dietary intake and continued taking of home insulin regimen despite the decreased Po intake. Received amp of d50 in the er -On SS-S -Diabetes care consult  Dispo: Disposition is deferred at this time, awaiting improvement of current medical problems.  Anticipated discharge in approximately 1-2 day(s).   The patient does have a current PCP [name], MD) and does need an [facility] hospital follow-up appointment after discharge.  The patient does not have transportation limitations that hinder transportation to clinic appointments.  LOS: 0 days   Norval Gable, MD 12/03/2015, 11:56 AM

## 2015-12-03 NOTE — ED Notes (Signed)
Attempted to call report

## 2015-12-03 NOTE — ED Notes (Signed)
Pt sitting on side of bed, states that she is most comfortable sitting like this.

## 2015-12-03 NOTE — ED Notes (Signed)
MD at bedside. 

## 2015-12-03 NOTE — Progress Notes (Signed)
PT Cancellation Note  Patient Details Name: Joy Butler MRN: [id number] DOB: November 03, 1951   Cancelled Treatment:    Reason Eval/Treat Not Completed: Patient declined, no reason specified Pt declined working with PT due to having a headache. Will follow up next available time.   [name] A Nelton Amsden 12/03/2015, 3:27 PM Wray Kearns, [place], DPT [phone]

## 2015-12-03 NOTE — ED Notes (Signed)
Pts CBG 154

## 2015-12-03 NOTE — Progress Notes (Signed)
Inpatient Diabetes Program Recommendations  AACE/ADA: New Consensus Statement on Inpatient Glycemic Control (2015)  Target Ranges:  Prepandial:   less than 140 mg/dL      Peak postprandial:   less than 180 mg/dL (1-2 hours)      Critically ill patients:  140 - 180 mg/dL    Results for Joy Butler, Joy Butler (MRN [id number]) as of 12/03/2015 12:52  Ref. Range 12/03/2015 02:12 12/03/2015 05:46 12/03/2015 06:18 12/03/2015 06:54 12/03/2015 09:10 12/03/2015 11:18  Glucose-Capillary Latest Ref Range: 65-99 mg/dL 104 (H) 70 187 (H) 154 (H) 166 (H) 83     Admit with: Hypoglycemia/ Elevated BP  History: DM, HTN  Home DM Meds: 70/30 insulin- 45 units bidwc  Current Insulin Orders: Novolog Sensitive SSI (0-9 units) Q4 hours      -Note patient admitted with Hypoglycemia and elevated BP.  Per MD notes, patient admits to taking full dose of 70/30 insulin despite having poor appetite at home.  -Glucose levels have stabilized since admission.  Currently has Orders for Novolog Sensitive SSI Q4 hours.  -Will likely need to assess insulin needs here in hospital setting to help determine if patient needs reduction of 70/30 insulin for home.     --Will follow patient during hospitalization--  Wyn Quaker RN, MSN, CDE Diabetes Coordinator Inpatient Glycemic Control Team Team Pager: [phone] (8a-5p)

## 2015-12-03 NOTE — ED Notes (Signed)
Per [facility]: [facility] called out for diabetic pt, then [facility] called for hypertension CBG 63 for PT[facility], repeat was 90. [facility] 240/ palpated, [facility] 246/122 - first, Last BP [id number] systolic. [facility] requested labetalol and were denied.

## 2015-12-03 NOTE — H&P (Signed)
Date: 12/03/2015               Patient Name:  Joy Butler MRN: [id number]  DOB: [birth year] Age / Sex: 64 y.o., female   PCP: [name], MD         Medical Service: Internal Medicine Teaching Service         Attending Physician: Dr. Axel Filler, MD    First Contact: Dr. Blane Ohara Pager: [phone]  Second Contact: Dr. Randell Patient Pager: [phone]       After Hours (After 5p/  First Contact Pager: [phone]  weekends / holidays): Second Contact Pager: [phone]   Chief Complaint: hypoglycemia and hypertension  History of Present Illness:   This is a 64 yo woman with PMH noted in [place], who is brought in because of worsening headache and elevated blood pressure and low cbg  She says that yesterday evening she noticed she was shaking a lot so she checked her cbg and it was 63 at 9 PM. She says that she had decreased appetite for last month or so and she did not eat dinner yesterday. However, she took 45 units of 70/30 both times yesterday. She normally checks her cbgs three times a day and they run in the 100-130s. She says that she lost about 20 pounds over the last months. She also says she is having diarrhea every time she eats which is non-bloody.   She says that she is on losartaan-hctz at home, but has not taken it int he last 4-5 days because of stress in the family. She says she has enough meds at home but just forgot to take them. She noticed she had a headache since yesterday which she describes it as diffuse, and throbbing and says that she has increase in her blurry vision. She denies n/v/f/c/sob. She does not check blood pressures at home.  She isalso on home xarelto but she does not recall if she has been taking it, or any of her other home meds except the insulin.    In the ER, she was found to be hypertensive at 200s/100s and low blood sugar. EKG was nonischemic and she continued to have headache and was visibly shaking. CT head was unremarkable.  CBG was  repeated and it was 70, and then an amp of d50 was given. Her troponin was 0.26 and she was found to be hypokalemic which was repleted. She was placed on nicardipine drip.     FH: hypertension and diabetes in both sides SH: she continues to smoke 7 cigarettes a day and has been doing that for >10 years No recreational drug use and no alcohol use Lives with her daughter   Meds: Current Facility-Administered Medications  Medication Dose Route Frequency Provider Last Rate Last Dose  . heparin injection 5,000 Units  5,000 Units Subcutaneous 3 times per day Norman Herrlich, MD      . insulin aspart (novoLOG) injection 0-9 Units  0-9 Units Subcutaneous 6 times per day Norman Herrlich, MD       Current Outpatient Prescriptions  Medication Sig Dispense Refill  . acetaminophen-codeine (TYLENOL #3) 300-30 MG tablet TAKE 1 TO 2 TABLETS BY MOUTH EVERY 6 HOURS AS NEEDED FOR MODERATE PAIN **MUST LAST 30 DAYS** 45 tablet 0  . albuterol (PROVENTIL HFA;VENTOLIN HFA) 108 (90 BASE) MCG/ACT inhaler Inhale 2 puffs into the lungs once. (Patient taking differently: Inhale 2 puffs into the lungs every 6 (six) hours as needed for wheezing or  shortness of breath. )    . amLODipine (NORVASC) 10 MG tablet TAKE 1 TABLET BY MOUTH EVERY DAY 30 tablet 11  . benzonatate (TESSALON) 100 MG capsule TAKE 1 CAPSULE BY MOUTH THREE TIMES A DAY AS NEEDED FOR COUGH 15 capsule 0  . dicyclomine (BENTYL) 20 MG tablet TAKE 1 TABLET BY MOUTH FOUR TIMES A DAY 120 tablet 1  . gabapentin (NEURONTIN) 300 MG capsule TAKE 1 CAPSULE BY MOUTH THREE TIMES A DAY 180 capsule 2  . guaiFENesin (MUCINEX) 600 MG 12 hr tablet Take 1 tablet (600 mg total) by mouth 2 (two) times daily. (Patient taking differently: Take 600 mg by mouth 2 (two) times daily as needed for cough or to loosen phlegm. ) 14 tablet 0  . HYDROcodone-homatropine (HYCODAN) 5-1.5 MG/5ML syrup Take 5 mLs by mouth every 6 (six) hours as needed for cough. 50 mL 0  . ibuprofen  (ADVIL,MOTRIN) 800 MG tablet Take 1 tablet (800 mg total) by mouth 3 (three) times daily. (Patient taking differently: Take 800 mg by mouth every 8 (eight) hours as needed for moderate pain. ) 21 tablet 0  . insulin NPH-regular Human (NOVOLIN 70/30) (70-30) 100 UNIT/ML injection Inject 50 Units into the skin 2 (two) times daily with a meal. (Patient taking differently: Inject 45 Units into the skin 2 (two) times daily with a meal. ) 30 mL 12  . loratadine (CLARITIN) 10 MG tablet Take 1 tablet (10 mg total) by mouth daily. 30 tablet 2  . losartan-hydrochlorothiazide (HYZAAR) 100-25 MG per tablet Take 1 tablet by mouth daily. 90 tablet 2  . metFORMIN (GLUCOPHAGE) 1000 MG tablet Take 1 tablet (1,000 mg total) by mouth 2 (two) times daily with a meal. 60 tablet 6  . omeprazole (PRILOSEC) 20 MG capsule TAKE 1 CAPSULE BY MOUTH EVERY DAY 30 capsule 3  . pravastatin (PRAVACHOL) 40 MG tablet TAKE 1 TABLET BY MOUTH EVERY EVENING 90 tablet 3  . rivaroxaban (XARELTO) 20 MG TABS tablet Take 1 tablet (20 mg total) by mouth daily with supper. 30 tablet 3  . SANTYL ointment APPLY TO AFFECTED AREA DAILY 90 g 2  . traZODone (DESYREL) 150 MG tablet TAKE 1 TABLET BY MOUTH EVERY NIGHT AT BEDTIME 30 tablet 1    Allergies: Allergies as of 12/03/2015 - Review Complete 12/03/2015  Allergen Reaction Noted  . Keflex [cephalexin] Itching and Swelling 08/19/2012  . Clindamycin/lincomycin Itching 07/27/2014  . Latex Itching 09/01/2012  . Sulfa antibiotics Rash 08/16/2012  . Sulfur Rash 08/16/2012   Past Medical History  Diagnosis Date  . Hypertension   . Pulmonary embolism ([place])     2011 treated at [facility] in [place].  Was on Coumadin for over a year.  no known family history  . UTI (urinary tract infection)   . High cholesterol   . 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 Depression   . Neuropathy ([facility])     feet  . Kidney stones   . Pneumonia     "several times" (07/26/2014)  . Type II diabetes mellitus  ([place]) dx'd 2000  . Daily headache   . Migraine     "last one was in the 1990's" (07/26/2014)  . Arthritis     "knees" (07/26/2014)  . Chronic back pain   . Cataract of both eyes    Past Surgical History  Procedure Laterality Date  . Cholecystectomy      1980  . Esophagogastroduodenoscopy  08/19/2012  Procedure: ESOPHAGOGASTRODUODENOSCOPY (EGD);  Surgeon: Winfield Cunas., MD;  Location: [facility] ENDOSCOPY;  Service: Endoscopy;  Laterality: N/A;  . Cesarean section  1982  . Carpal tunnel release Bilateral   . Tonsillectomy    . Artery biopsy Right 05/09/2014    Procedure: BIOPSY TEMPORAL ARTERY;  Surgeon: Rosetta Posner, MD;  Location: [facility];  Service: Vascular;  Laterality: Right;  . Appendectomy    . Vaginal hysterectomy    . Dilation and curettage of uterus  1982  . Cystoscopy w/ stone manipulation    . Breast biopsy Left   . Temporal artery biopsy / ligation Right 04/2014  . Eye surgery Bilateral   . Refractive surgery Bilateral    Family History  Problem Relation Age of Onset  . Adopted: Yes  . Hypertension Mother    Social History   Social History  . Marital Status: Single    Spouse Name: N/A  . Number of Children: N/A  . Years of Education: N/A   Occupational History  . Not on file.   Social History Main Topics  . Smoking status: Current Every Day Smoker -- 0.25 packs/day for 44 years    Types: Cigarettes  . Smokeless tobacco: Never Used     Comment: 1/2 PPD  . Alcohol Use: No  . Drug Use: No  . Sexual Activity: Yes   Other Topics Concern  . Not on file   Social History Narrative   Previously divorced now engaged with her partner of 4 years.  Has 6 grown children with 21 grandchildren.  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  Moved to [place] in August 2013 and was transiently homeless until first part of October 2013.      Review of Systems: Per HPI  Physical Exam: Blood pressure 165/89, pulse 79, temperature 98.5  F (36.9 C), resp. rate 13, SpO2 98 %.  General: A&O, was shaking a lot and having chills  HEENT: EOMI, PERRLA, Neck: supple, midline trachea,  CV: RRR, normal s1, s2, no m/r/g,no JVD appreciated Resp: equal and symmetric breath sounds, no wheezing heard Abdomen: soft, nontender, nondistended, +BS in all 4 quadrants, no bruits appreciated GU: no CVA tenderness Skin: warm, dry, Extremities: pulses intact b/l, 2+ pitting edema in both legs  Neurologic: no focal deficits  CN II-XII grossly intact Sensory: intact to light touch Motor: 5/5 strength in upper, 5/5 in lower extremities,       Lab results: Results for orders placed or performed during the hospital encounter of 12/03/15 (from the past 24 hour(s))  CBC with Differential     Status: Abnormal   Collection Time: 12/03/15  2:00 AM  Result Value Ref Range   WBC 10.6 (H) 4.0 - 10.5 K/uL   RBC 4.63 3.87 - 5.11 MIL/uL   Hemoglobin 12.4 12.0 - 15.0 g/dL   HCT 38.8 36.0 - 46.0 %   MCV 83.8 78.0 - 100.0 fL   MCH 26.8 26.0 - 34.0 pg   MCHC 32.0 30.0 - 36.0 g/dL   RDW 17.3 (H) 11.5 - 15.5 %   Platelets 354 150 - 400 K/uL   Neutrophils Relative % 77 %   Neutro Abs 8.0 (H) 1.7 - 7.7 K/uL   Lymphocytes Relative 14 %   Lymphs Abs 1.5 0.7 - 4.0 K/uL   Monocytes Relative 8 %   Monocytes Absolute 0.9 0.1 - 1.0 K/uL   Eosinophils Relative 1 %   Eosinophils Absolute  0.2 0.0 - 0.7 K/uL   Basophils Relative 0 %   Basophils Absolute 0.0 0.0 - 0.1 K/uL  Basic metabolic panel     Status: Abnormal   Collection Time: 12/03/15  2:00 AM  Result Value Ref Range   Sodium 142 135 - 145 mmol/L   Potassium 2.7 (LL) 3.5 - 5.1 mmol/L   Chloride 111 101 - 111 mmol/L   CO2 22 22 - 32 mmol/L   Glucose, Bld 94 65 - 99 mg/dL   BUN 9 6 - 20 mg/dL   Creatinine, Ser 1.67 (H) 0.44 - 1.00 mg/dL   Calcium 8.3 (L) 8.9 - 10.3 mg/dL   GFR calc non Af Amer 31 (L) >60 mL/min   GFR calc Af Amer 36 (L) >60 mL/min   Anion gap 9 5 - 15  Troponin I     Status:  Abnormal   Collection Time: 12/03/15  2:00 AM  Result Value Ref Range   Troponin I 0.27 (H) <0.031 ng/mL  CBG monitoring, ED     Status: Abnormal   Collection Time: 12/03/15  2:12 AM  Result Value Ref Range   Glucose-Capillary 104 (H) 65 - 99 mg/dL   Comment 1 Notify RN    Comment 2 Document in Chart   Urinalysis, Routine w reflex microscopic (not at [facility])     Status: Abnormal   Collection Time: 12/03/15  2:49 AM  Result Value Ref Range   Color, Urine YELLOW YELLOW   APPearance CLEAR CLEAR   Specific Gravity, Urine 1.009 1.005 - 1.030   pH 7.0 5.0 - 8.0   Glucose, UA 100 (A) NEGATIVE mg/dL   Hgb urine dipstick SMALL (A) NEGATIVE   Bilirubin Urine NEGATIVE NEGATIVE   Ketones, ur NEGATIVE NEGATIVE mg/dL   Protein, ur >300 (A) NEGATIVE mg/dL   Nitrite NEGATIVE NEGATIVE   Leukocytes, UA NEGATIVE NEGATIVE  Urine microscopic-add on     Status: Abnormal   Collection Time: 12/03/15  2:49 AM  Result Value Ref Range   Squamous Epithelial / LPF 0-5 (A) NONE SEEN   WBC, UA NONE SEEN 0 - 5 WBC/hpf   RBC / HPF 0-5 0 - 5 RBC/hpf   Bacteria, UA FEW (A) NONE SEEN  CBG monitoring, ED     Status: None   Collection Time: 12/03/15  5:46 AM  Result Value Ref Range   Glucose-Capillary 70 65 - 99 mg/dL  CBG monitoring, ED     Status: Abnormal   Collection Time: 12/03/15  6:18 AM  Result Value Ref Range   Glucose-Capillary 187 (H) 65 - 99 mg/dL   Comment 1 Notify RN    Comment 2 Document in Chart      Imaging results:  Ct Head Wo Contrast  12/03/2015  CLINICAL DATA:  Headache and hypertension. EXAM: CT HEAD WITHOUT CONTRAST TECHNIQUE: Contiguous axial images were obtained from the base of the skull through the vertex without intravenous contrast. COMPARISON:  08/17/2012 FINDINGS: Ventricles and sulci appear symmetrical. No ventricular dilatation. Mild periventricular low-attenuation change likely representing small vessel ischemia. Basal ganglia calcifications. No mass effect or midline shift.  No abnormal extra-axial fluid collections. Gray-white matter junctions are distinct. Basal cisterns are not effaced. No evidence of acute intracranial hemorrhage. No depressed skull fractures. Visualized paranasal sinuses and mastoid air cells are not opacified. Vascular calcifications. IMPRESSION: No acute intracranial abnormalities. Low-attenuation changes in the deep white matter likely small vessel ischemia. Electronically Signed   By: Lucienne Capers M.D.   On:  12/03/2015 03:40    Other results: EKG: NSR, LAD  Assessment & Plan by Problem: Active Problems:   Hypertensive emergency   Elevated troponins: Pt who has no chest pain. EKG no ischemic changes. 1st trop 0.26 -trending troponins x3   Hypertensive emergency due to noncompliance with home antiHTN: Patient who has hypertension with end organ damage who was found to have BP of 200s/100s, which was improved to 150s/90s on admission. She had headaches, elevated creatinine, and hypokalemia. She also has LAD on EKG. She is also on home xarelto but does not recall taking it. Other differentials for refractory (which this is not) htn would be secondary causes like RAS, cushings, primary hyperaldo, sleep apnea etc but they are less likely   -continue nicardipine -restart home meds once bp better controlled -CBC and CMET in AM -ordered PAC/PRC to check for primary hyperaldo -ordered LDH to rule out renal art thrombosis  Hypoglycemia: most likely due to poor dietary intake and continued taking of home insulin regimen despite the decreased Po intake. Received amp of d50 in the er  -on SS-S  Malaise: Pt who says she has decreased apetite, general feeling of malaise, some weight loss, and hypokalemia. Her symptoms might be due to adrenal insufficiency. Her elevated blood pressure would argue against adrenal insufficiency   -consider AM cortisol -check TSH  AKI:  Patient with creatinine of 1.67, baseline around 1.2, most likely end organ  damage +/- poor po intake . She had dry mucous membranes on exam -repeat cmet -consider gently hydration  Hypokalemia: 3.2 in er -repleted -repeat BMET  HLD: pravastatin -continue above  History fo PE- does not recall if she took her xarelto or not -continue xarelto  Dispo: Disposition is deferred at this time, awaiting improvement of current medical problems. Anticipated discharge in approximately 2 day(s).   The patient does have a current PCP [name], MD) and does need an [facility] hospital follow-up appointment after discharge.  The patient does not have transportation limitations that hinder transportation to clinic appointments.  Signed: [name], MD 12/03/2015, 6:10 AM

## 2015-12-03 NOTE — ED Notes (Signed)
Pt also complaining of blurred vision and headache. Pt states that she feels like it is hard to take a deep breath, hold center of chest to show were pressure is.

## 2015-12-03 NOTE — Progress Notes (Signed)
Patient arrived to the floor from ED. BP elevated. No SOB or Chest pain. Tele placed and CCMD notified. Patient oriented to the floor and the unit. Call bell within reach. Fall risk protocol put in place. Will continue to monitor.   Domingo Dimes RN

## 2015-12-03 NOTE — ED Provider Notes (Signed)
CSN: [id number]     Arrival date & time 12/03/15  0129 History   By signing my name below, I, [name], attest that this documentation has been prepared under the direction and in the presence of [name], MD.  Electronically Signed: [name], ED Scribe. 12/03/2015. 2:28 AM.   Chief Complaint  Patient presents with  . Hypertension   The history is provided by the patient. No language interpreter was used.    HPI Comments: Joy Butler brought in by EMS is a 64 y.o. female with a PMHx of HTN, PE, hyperlipidemia, and DM who presents to the Emergency Department here for hypertension this evening. Pt reports constant, ongoing, gradually worsening HA, shortness of breath, and mild blurred vision. Currently she rates pain 9/10. No aggravating or alleviating factors at this time. No OTC medications or home remedies attempted prior to arrival. No recent fever, chills, nausea, vomiting, or chest pain. She denies any dizziness. No weakness, loss of sensation, or numbness. [name] states she is on prescribed medications which she is taking as directed. No recent missed doses reported.  PCP: [name], MD    Past Medical History  Diagnosis Date  . Hypertension   . Pulmonary embolism ([place])     2011 treated at [facility] in [place].  Was on Coumadin for over a year.  no known family history  . UTI (urinary tract infection)   . High cholesterol   . 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 Depression   . Neuropathy ([facility])     feet  . Kidney stones   . Pneumonia     "several times" (07/26/2014)  . Type II diabetes mellitus ([place]) dx'd 2000  . Daily headache   . Migraine     "last one was in the 1990's" (07/26/2014)  . Arthritis     "knees" (07/26/2014)  . Chronic back pain   . Cataract of both eyes    Past Surgical History  Procedure Laterality Date  . Cholecystectomy      1980  . Esophagogastroduodenoscopy  08/19/2012    Procedure: ESOPHAGOGASTRODUODENOSCOPY  (EGD);  Surgeon: Winfield Cunas., MD;  Location: [facility] ENDOSCOPY;  Service: Endoscopy;  Laterality: N/A;  . Cesarean section  1982  . Carpal tunnel release Bilateral   . Tonsillectomy    . Artery biopsy Right 05/09/2014    Procedure: BIOPSY TEMPORAL ARTERY;  Surgeon: Rosetta Posner, MD;  Location: [facility];  Service: Vascular;  Laterality: Right;  . Appendectomy    . Vaginal hysterectomy    . Dilation and curettage of uterus  1982  . Cystoscopy w/ stone manipulation    . Breast biopsy Left   . Temporal artery biopsy / ligation Right 04/2014  . Eye surgery Bilateral   . Refractive surgery Bilateral    Family History  Problem Relation Age of Onset  . Adopted: Yes  . Hypertension Mother    Social History  Substance Use Topics  . Smoking status: Current Every Day Smoker -- 0.25 packs/day for 44 years    Types: Cigarettes  . Smokeless tobacco: Never Used     Comment: 1/2 PPD  . Alcohol Use: No   OB History    No data available     Review of Systems  Eyes: Positive for visual disturbance.  Respiratory: Positive for shortness of breath. Negative for cough.   Cardiovascular: Negative for chest pain.  Gastrointestinal: Negative for nausea, vomiting, abdominal  pain and diarrhea.  Musculoskeletal: Negative for back pain.  Skin: Negative for rash.  Neurological: Positive for headaches. Negative for weakness and numbness.  Psychiatric/Behavioral: Negative for confusion.  All other systems reviewed and are negative.     Allergies  Keflex; Clindamycin/lincomycin; Latex; Sulfa antibiotics; and Sulfur  Home Medications   Prior to Admission medications   Medication Sig Start Date End Date Taking? Authorizing Provider  acetaminophen-codeine (TYLENOL #3) 300-30 MG tablet TAKE 1 TO 2 TABLETS BY MOUTH EVERY 6 HOURS AS NEEDED FOR MODERATE PAIN **MUST LAST 30 DAYS** 10/23/15  Yes Bartholomew Crews, MD  albuterol (PROVENTIL HFA;VENTOLIN HFA) 108 (90 BASE) MCG/ACT inhaler Inhale 2 puffs into  the lungs once. Patient taking differently: Inhale 2 puffs into the lungs every 6 (six) hours as needed for wheezing or shortness of breath.  09/23/15  Yes Merrily Pew, MD  amLODipine (NORVASC) 10 MG tablet TAKE 1 TABLET BY MOUTH EVERY DAY 10/23/15  Yes Bartholomew Crews, MD  benzonatate (TESSALON) 100 MG capsule TAKE 1 CAPSULE BY MOUTH THREE TIMES A DAY AS NEEDED FOR COUGH 10/18/15  Yes Shela Leff, MD  dicyclomine (BENTYL) 20 MG tablet TAKE 1 TABLET BY MOUTH FOUR TIMES A DAY 10/18/15  Yes Shela Leff, MD  gabapentin (NEURONTIN) 300 MG capsule TAKE 1 CAPSULE BY MOUTH THREE TIMES A DAY 09/25/15  Yes [name], MD  guaiFENesin (MUCINEX) 600 MG 12 hr tablet Take 1 tablet (600 mg total) by mouth 2 (two) times daily. Patient taking differently: Take 600 mg by mouth 2 (two) times daily as needed for cough or to loosen phlegm.  10/04/15 10/03/16 Yes Shela Leff, MD  HYDROcodone-homatropine [facility]) 5-1.5 MG/5ML syrup Take 5 mLs by mouth every 6 (six) hours as needed for cough. 09/23/15  Yes Merrily Pew, MD  ibuprofen (ADVIL,MOTRIN) 800 MG tablet Take 1 tablet (800 mg total) by mouth 3 (three) times daily. Patient taking differently: Take 800 mg by mouth every 8 (eight) hours as needed for moderate pain.  09/23/15  Yes Merrily Pew, MD  insulin NPH-regular Human (NOVOLIN 70/30) (70-30) 100 UNIT/ML injection Inject 50 Units into the skin 2 (two) times daily with a meal. Patient taking differently: Inject 45 Units into the skin 2 (two) times daily with a meal.  05/22/15  Yes Jessee Avers, MD  loratadine (CLARITIN) 10 MG tablet Take 1 tablet (10 mg total) by mouth daily. 08/19/15 08/18/16 Yes [name], MD  losartan-hydrochlorothiazide (HYZAAR) 100-25 MG per tablet Take 1 tablet by mouth daily. 04/08/15  Yes Jessee Avers, MD  metFORMIN (GLUCOPHAGE) 1000 MG tablet Take 1 tablet (1,000 mg total) by mouth 2 (two) times daily with a meal. 04/08/15  Yes Jessee Avers, MD  omeprazole  (PRILOSEC) 20 MG capsule TAKE 1 CAPSULE BY MOUTH EVERY DAY 09/25/15  Yes Annia Belt, MD  pravastatin (PRAVACHOL) 40 MG tablet TAKE 1 TABLET BY MOUTH EVERY EVENING 01/30/15  Yes Jessee Avers, MD  rivaroxaban (XARELTO) 20 MG TABS tablet Take 1 tablet (20 mg total) by mouth daily with supper. 06/19/15  Yes Jessee Avers, MD  SANTYL ointment APPLY TO AFFECTED AREA DAILY 10/16/15  Yes [name], MD  traZODone (DESYREL) 150 MG tablet TAKE 1 TABLET BY MOUTH EVERY NIGHT AT BEDTIME 09/25/15  Yes Annia Belt, MD   Triage Vitals: BP 174/95 mmHg  Pulse 75  Temp(Src) 98.5 F (36.9 C)  Resp 15  SpO2 91%   Physical Exam  Constitutional: She is oriented to person, place, and time. She  appears well-developed and well-nourished.  Obese  HENT:  Head: Normocephalic and atraumatic.  Eyes: EOM are normal. Pupils are equal, round, and reactive to light.  Neck: Normal range of motion. Neck supple.  Cardiovascular: Normal rate, regular rhythm and normal heart sounds.   No murmur heard. Pulmonary/Chest: Effort normal and breath sounds normal. No respiratory distress. She has no wheezes.  Abdominal: Soft. Bowel sounds are normal. There is no tenderness. There is no rebound.  Musculoskeletal: She exhibits edema.  Neurological: She is alert and oriented to person, place, and time.  Skin: Skin is warm and dry.  Course eyes burn right lower extremity, no cellulitis noted, no blistering  Psychiatric: She has a normal mood and affect.  Nursing note and vitals reviewed.   ED Course  Procedures (including critical care time)  CRITICAL CARE Performed by: [name]   Total critical care time: 45 minutes  Critical care time was exclusive of separately billable procedures and treating other patients.  Critical care was necessary to treat or prevent imminent or life-threatening deterioration.  Critical care was time spent personally by me on the following activities: development  of treatment plan with patient and/or surrogate as well as nursing, discussions with consultants, evaluation of patient's response to treatment, examination of patient, obtaining history from patient or surrogate, ordering and performing treatments and interventions, ordering and review of laboratory studies, ordering and review of radiographic studies, pulse oximetry and re-evaluation of patient's condition.   DIAGNOSTIC STUDIES: Oxygen Saturation is 98% on RA, Normal by my interpretation.    COORDINATION OF CARE: 2:03 AM- Will order CT head without contrast, CBC, BMP, Troponin I, and urinalysis. Discussed treatment plan with pt at bedside and pt agreed to plan.     Labs Review Labs Reviewed  CBC WITH DIFFERENTIAL/PLATELET - Abnormal; Notable for the following:    WBC 10.6 (*)    RDW 17.3 (*)    Neutro Abs 8.0 (*)    All other components within normal limits  BASIC METABOLIC PANEL - Abnormal; Notable for the following:    Potassium 2.7 (*)    Creatinine, Ser 1.67 (*)    Calcium 8.3 (*)    GFR calc non Af Amer 31 (*)    GFR calc Af Amer 36 (*)    All other components within normal limits  TROPONIN I - Abnormal; Notable for the following:    Troponin I 0.27 (*)    All other components within normal limits  URINALYSIS, ROUTINE W REFLEX MICROSCOPIC (NOT AT [facility]) - Abnormal; Notable for the following:    Glucose, UA 100 (*)    Hgb urine dipstick SMALL (*)    Protein, ur >300 (*)    All other components within normal limits  URINE MICROSCOPIC-ADD ON - Abnormal; Notable for the following:    Squamous Epithelial / LPF 0-5 (*)    Bacteria, UA FEW (*)    All other components within normal limits  CBG MONITORING, ED - Abnormal; Notable for the following:    Glucose-Capillary 104 (*)    All other components within normal limits    Imaging Review Ct Head Wo Contrast  12/03/2015  CLINICAL DATA:  Headache and hypertension. EXAM: CT HEAD WITHOUT CONTRAST TECHNIQUE: Contiguous axial images  were obtained from the base of the skull through the vertex without intravenous contrast. COMPARISON:  08/17/2012 FINDINGS: Ventricles and sulci appear symmetrical. No ventricular dilatation. Mild periventricular low-attenuation change likely representing small vessel ischemia. Basal ganglia calcifications. No mass effect  or midline shift. No abnormal extra-axial fluid collections. Gray-white matter junctions are distinct. Basal cisterns are not effaced. No evidence of acute intracranial hemorrhage. No depressed skull fractures. Visualized paranasal sinuses and mastoid air cells are not opacified. Vascular calcifications. IMPRESSION: No acute intracranial abnormalities. Low-attenuation changes in the deep white matter likely small vessel ischemia. Electronically Signed   By: Lucienne Capers M.D.   On: 12/03/2015 03:40   I have personally reviewed and evaluated these images and lab results as part of my medical decision-making.   EKG Interpretation ED ECG REPORT   Date: 12/03/2015  Rate: 89  Rhythm: normal sinus rhythm  QRS Axis: normal  Intervals: QT prolonged  ST/T Wave abnormalities: normal  Conduction Disutrbances:none  Narrative Interpretation:   Old EKG Reviewed: unchanged  I have personally reviewed the EKG tracing and agree with the computerized printout as noted.     MDM   Final diagnoses:  Hypertensive emergency  Elevated troponin  Acute nonintractable headache, unspecified headache type  Hypokalemia  Patient presents with headache, shortness of breath, low blood sugars at home. Nontoxic. Nonfocal. Initial blood pressure 193/94. Subsequent blood pressures 200s over 100s. Patient reports compliance with medications. EKG is nonischemic. Patient is complaining of a headache. She is neurologically intact. She does have a history of migraines and denies worst headache of her life. CT head is negative for acute bleed. Initially given a headache cocktail to include magnesium to  attempt to help with blood pressure. Patient's headache minimally improved and blood pressure remained high. Subsequently hydralazine was given. Of note, patient's troponin is 0.26. She does have coronary artery risk factors. She is currently without any chest pain. Troponin was obtained given her reports of shortness of breath. This in addition to the headache could reflect hypertensive emergency.  Patient was subsequently given 5 mg of labetalol. Patient also noted to be hypokalemic. This was replaced both IV and by mouth. Discussed with the teaching service. They will evaluate the patient. I have placed the patient on a nicardipine drip given persistently elevated blood pressures.  I personally performed the services described in this documentation, which was scribed in my presence. The recorded information has been reviewed and is accurate.   [name], MD 12/03/15 [phone]

## 2015-12-04 DIAGNOSIS — R1031 Right lower quadrant pain: Secondary | ICD-10-CM | POA: Diagnosis not present

## 2015-12-04 DIAGNOSIS — Z114 Encounter for screening for human immunodeficiency virus [HIV]: Secondary | ICD-10-CM

## 2015-12-04 DIAGNOSIS — E46 Unspecified protein-calorie malnutrition: Secondary | ICD-10-CM | POA: Diagnosis not present

## 2015-12-04 DIAGNOSIS — R634 Abnormal weight loss: Secondary | ICD-10-CM | POA: Diagnosis not present

## 2015-12-04 DIAGNOSIS — I160 Hypertensive urgency: Secondary | ICD-10-CM | POA: Diagnosis not present

## 2015-12-04 LAB — T4, FREE: FREE T4: 1.04 ng/dL (ref 0.61–1.12)

## 2015-12-04 LAB — GLUCOSE, CAPILLARY
GLUCOSE-CAPILLARY: 98 mg/dL (ref 65–99)
GLUCOSE-CAPILLARY: 127 mg/dL — AB (ref 65–99)
GLUCOSE-CAPILLARY: 123 mg/dL — AB (ref 65–99)
GLUCOSE-CAPILLARY: 102 mg/dL — AB (ref 65–99)
GLUCOSE-CAPILLARY: 111 mg/dL — AB (ref 65–99)
Glucose-Capillary: 113 mg/dL — ABNORMAL HIGH (ref 65–99)
Glucose-Capillary: 134 mg/dL — ABNORMAL HIGH (ref 65–99)

## 2015-12-04 LAB — BASIC METABOLIC PANEL
Anion gap: 9 (ref 5–15)
BUN: 11 mg/dL (ref 6–20)
CALCIUM: 8.3 mg/dL — AB (ref 8.9–10.3)
CHLORIDE: 110 mmol/L (ref 101–111)
CO2: 20 mmol/L — AB (ref 22–32)
CREATININE: 1.78 mg/dL — AB (ref 0.44–1.00)
GFR calc non Af Amer: 29 mL/min — ABNORMAL LOW (ref >60)
GFR, EST AFRICAN AMERICAN: 33 mL/min — AB (ref >60)
GLUCOSE: 117 mg/dL — AB (ref 65–99)
Potassium: 3.8 mmol/L (ref 3.5–5.1)
Sodium: 139 mmol/L (ref 135–145)

## 2015-12-04 LAB — MAGNESIUM: Magnesium: 1.9 mg/dL (ref 1.7–2.4)

## 2015-12-04 MED ORDER — SODIUM CHLORIDE 0.9 % IV SOLN
INTRAVENOUS | Status: DC
Start: 2015-12-04 — End: 2015-12-06

## 2015-12-04 MED ORDER — PEG 3350-KCL-NA BICARB-NACL 420 G PO SOLR
4000.0000 mL | Freq: Once | ORAL | Status: AC
  Administered 2015-12-05: 4000 mL via ORAL
  Filled 2015-12-04: qty 4000

## 2015-12-04 MED ORDER — INSULIN ASPART 100 UNIT/ML ~~LOC~~ SOLN
0.0000 [IU] | Freq: Three times a day (TID) | SUBCUTANEOUS | Status: DC
  Administered 2015-12-05 – 2015-12-06 (×2): 1 [IU] via SUBCUTANEOUS

## 2015-12-04 MED ORDER — IOHEXOL 300 MG/ML  SOLN
80.0000 mL | Freq: Once | INTRAMUSCULAR | Status: AC | PRN
  Administered 2015-12-04: 80 mL via INTRAVENOUS

## 2015-12-04 NOTE — Evaluation (Signed)
Physical Therapy Evaluation Patient Details Name: Joy Butler MRN: [id number] DOB: April 01, 1951 Today's Date: 12/04/2015   History of Present Illness  Patient is a 64 y/o female who presents with worsening headache, elevated blood pressure and low cbg. Admitted with Symptomatic hypertension due to medication noncompliance. PMH includes HTN, DM, recurrent PE, LE neuropathy, chronic back pain.  Clinical Impression  Patient presents with functional limitations due to deficits listed in PT problem list (see below). Pt with generalized weakness and balance deficits putting pt at increased risk for falls. Required use of RW for support today and normally uses SPC PRN. Pt will have support from spouse at home. Anticipate with increased mobility, pt's strength will improve. Will follow acutely to maximize independence and mobility prior to return home.    Follow Up Recommendations Home health PT;Supervision for mobility/OOB    Equipment Recommendations  Rolling walker with 5" wheels    Recommendations for Other Services OT consult     Precautions / Restrictions Precautions Precautions: Fall Restrictions Weight Bearing Restrictions: No      Mobility  Bed Mobility               General bed mobility comments: Sitting EOB upon PT arrival.   Transfers Overall transfer level: Needs assistance Equipment used: None Transfers: Sit to/from Stand Sit to Stand: Min guard         General transfer comment: Min guard assist for safety. Stood from [employer], from toilet x1. Pt reaching for support upon standing. Sway noted.   Ambulation/Gait Ambulation/Gait assistance: Min assist Ambulation Distance (Feet): 150 Feet Assistive device: Rolling walker (2 wheeled) Gait Pattern/deviations: Step-through pattern;Decreased stride length;Trunk flexed Gait velocity: very decreased Gait velocity interpretation: Below normal speed for age/gender General Gait Details: very slow, unsteady gait  requiring use of RW for support. 1 LOB due to decreased speed requiring Min A to correct. HR up to 115 bpm. Ambulated within room without RW however furniture walking due to unsteadiness.  Stairs            Wheelchair Mobility    Modified Rankin (Stroke Patients Only)       Balance Overall balance assessment: Needs assistance Sitting-balance support: Feet supported;No upper extremity supported Sitting balance-Leahy Scale: Good     Standing balance support: During functional activity Standing balance-Leahy Scale: Fair Standing balance comment: Requires UE support during static and dynamic standing - reaching for furniture during room ambulation.                             Pertinent Vitals/Pain Pain Assessment: No/denies pain    Home Living Family/patient expects to be discharged to:: Private residence Living Arrangements: Spouse/significant other Available Help at Discharge: Family;Available 24 hours/day Type of Home: House Home Access: Stairs to enter Entrance Stairs-Rails: Psychiatric nurse of Steps: 4 Home Layout: One level Home Equipment: Cane - single point;Shower seat      Prior Function Level of Independence: Independent with assistive device(s)         Comments: Pt uses SPC PRN.      Hand Dominance   Dominant Hand: Right    Extremity/Trunk Assessment   Upper Extremity Assessment: Defer to OT evaluation           Lower Extremity Assessment: Generalized weakness         Communication   Communication: No difficulties  Cognition Arousal/Alertness: Lethargic Behavior During Therapy: WFL for tasks assessed/performed Overall Cognitive Status: Within  Functional Limits for tasks assessed                      General Comments General comments (skin integrity, edema, etc.): Spouse present in room during session.    Exercises        Assessment/Plan    PT Assessment Patient needs continued PT  services  PT Diagnosis Difficulty walking;Generalized weakness   PT Problem List Decreased strength;Decreased activity tolerance;Decreased balance;Decreased mobility;Impaired sensation  PT Treatment Interventions Balance training;Gait training;Functional mobility training;Therapeutic activities;Therapeutic exercise;Patient/family education;Stair training   PT Goals (Current goals can be found in the Care Plan section) Acute Rehab PT Goals Patient Stated Goal: to feel better PT Goal Formulation: With patient Time For Goal Achievement: 12/18/15 Potential to Achieve Goals: Fair    Frequency Min 3X/week   Barriers to discharge Inaccessible home environment 4 steps to enter home    Co-evaluation               End of Session Equipment Utilized During Treatment: Gait belt Activity Tolerance: Patient tolerated treatment well Patient left: in bed;with call bell/phone within reach;with bed alarm set;with family/visitor present Nurse Communication: Mobility status    Functional Assessment Tool Used: Clinical judgement Functional Limitation: Mobility: Walking and moving around Mobility: Walking and Moving Around Current Status [phone]): At least 20 percent but less than 40 percent impaired, limited or restricted Mobility: Walking and Moving Around Goal Status [phone]): At least 1 percent but less than 20 percent impaired, limited or restricted    Time: 1322-1347 PT Time Calculation (min) (ACUTE ONLY): 25 min   Charges:   PT Evaluation $Initial PT Evaluation Tier I: 1 Procedure PT Treatments $Gait Training: 8-22 mins   PT G Codes:   PT G-Codes **NOT FOR INPATIENT CLASS** Functional Assessment Tool Used: Clinical judgement Functional Limitation: Mobility: Walking and moving around Mobility: Walking and Moving Around Current Status [id number]): At least 20 percent but less than 40 percent impaired, limited or restricted Mobility: Walking and Moving Around Goal Status [phone]): At least 1  percent but less than 20 percent impaired, limited or restricted    [place] 12/04/2015, 1:48 PM Wray Kearns, [place], DPT [phone]

## 2015-12-04 NOTE — Care Management Obs Status (Signed)
[address] NOTIFICATION   Patient Details  Name: Joy Butler MRN: [id number] Date of Birth: 08/15/1951   Medicare Observation Status Notification Given:  No (Pt did not receive obs notice, documented in error)    Maryclare Labrador, RN 12/04/2015, 4:37 PM

## 2015-12-04 NOTE — Consult Note (Signed)
Referring Provider: Dr. Evette Doffing Primary Care Physician:  [name], MD Primary Gastroenterologist:  Althia Forts  Reason for Consultation:  Abdominal pain; Weight loss  HPI: Joy Butler is a 64 y.o. female admitted for hypertensive emergency and seen for a GI consult due to abdominal pain and weight loss. Reports diffuse abdominal pain for over a year that feels like a dull ache. Eating makes the pain worse and when she does not eat she reports that she does not have the pain. Has postprandial diarrhea that is nonbloody. Denies melena or hematochezia. Abdominal pain improves after having a loose stool. Sometimes has soft stools. Decreased appetite for months. Reports a 50 pound unintentional weight loss over the past 3 months. She thinks she had a normal colonoscopy in [place] one year ago (records not available). No acute findings on abd/pelvis CT.    Past Medical History  Diagnosis Date  . Hypertension   . Pulmonary embolism ([place])     2011 treated at [facility] in [place].  Was on Coumadin for over a year.  no known family history  . UTI (urinary tract infection)   . High cholesterol   . 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 Depression   . Neuropathy ([facility])     feet  . Kidney stones   . Pneumonia     "several times" (07/26/2014)  . Type II diabetes mellitus ([place]) dx'd 2000  . Daily headache   . Migraine     "last one was in the 1990's" (07/26/2014)  . Arthritis     "knees" (07/26/2014)  . Chronic back pain   . Cataract of both eyes   . Hypertensive emergency 12/03/2015    Past Surgical History  Procedure Laterality Date  . Cholecystectomy      1980  . Esophagogastroduodenoscopy  08/19/2012    Procedure: ESOPHAGOGASTRODUODENOSCOPY (EGD);  Surgeon: Winfield Cunas., MD;  Location: [facility] ENDOSCOPY;  Service: Endoscopy;  Laterality: N/A;  . Cesarean section  1982  . Carpal tunnel release Bilateral   . Tonsillectomy    . Artery biopsy Right 05/09/2014    Procedure:  BIOPSY TEMPORAL ARTERY;  Surgeon: Rosetta Posner, MD;  Location: [place];  Service: Vascular;  Laterality: Right;  . Appendectomy    . Vaginal hysterectomy    . Dilation and curettage of uterus  1982  . Cystoscopy w/ stone manipulation    . Breast biopsy Left   . Temporal artery biopsy / ligation Right 04/2014  . Eye surgery Bilateral   . Refractive surgery Bilateral     Prior to Admission medications   Medication Sig Start Date End Date Taking? Authorizing Provider  acetaminophen-codeine (TYLENOL #3) 300-30 MG tablet TAKE 1 TO 2 TABLETS BY MOUTH EVERY 6 HOURS AS NEEDED FOR MODERATE PAIN **MUST LAST 30 DAYS** 10/23/15  Yes Bartholomew Crews, MD  albuterol (PROVENTIL HFA;VENTOLIN HFA) 108 (90 BASE) MCG/ACT inhaler Inhale 2 puffs into the lungs once. Patient taking differently: Inhale 2 puffs into the lungs every 6 (six) hours as needed for wheezing or shortness of breath.  09/23/15  Yes Merrily Pew, MD  amLODipine (NORVASC) 10 MG tablet TAKE 1 TABLET BY MOUTH EVERY DAY 10/23/15  Yes Bartholomew Crews, MD  benzonatate (TESSALON) 100 MG capsule TAKE 1 CAPSULE BY MOUTH THREE TIMES A DAY AS NEEDED FOR COUGH 10/18/15  Yes Shela Leff, MD  dicyclomine (BENTYL) 20 MG tablet TAKE 1 TABLET BY MOUTH FOUR TIMES A DAY 10/18/15  Yes Shela Leff, MD  gabapentin (NEURONTIN) 300 MG capsule TAKE 1 CAPSULE BY MOUTH THREE TIMES A DAY 09/25/15  Yes [name], MD  guaiFENesin (MUCINEX) 600 MG 12 hr tablet Take 1 tablet (600 mg total) by mouth 2 (two) times daily. Patient taking differently: Take 600 mg by mouth 2 (two) times daily as needed for cough or to loosen phlegm.  10/04/15 10/03/16 Yes Shela Leff, MD  HYDROcodone-homatropine [facility]) 5-1.5 MG/5ML syrup Take 5 mLs by mouth every 6 (six) hours as needed for cough. 09/23/15  Yes Merrily Pew, MD  ibuprofen (ADVIL,MOTRIN) 800 MG tablet Take 1 tablet (800 mg total) by mouth 3 (three) times daily. Patient taking differently: Take 800 mg by  mouth every 8 (eight) hours as needed for moderate pain.  09/23/15  Yes Merrily Pew, MD  insulin NPH-regular Human (NOVOLIN 70/30) (70-30) 100 UNIT/ML injection Inject 50 Units into the skin 2 (two) times daily with a meal. Patient taking differently: Inject 45 Units into the skin 2 (two) times daily with a meal.  05/22/15  Yes Jessee Avers, MD  loratadine (CLARITIN) 10 MG tablet Take 1 tablet (10 mg total) by mouth daily. 08/19/15 08/18/16 Yes [name], MD  losartan-hydrochlorothiazide (HYZAAR) 100-25 MG per tablet Take 1 tablet by mouth daily. 04/08/15  Yes Jessee Avers, MD  metFORMIN (GLUCOPHAGE) 1000 MG tablet Take 1 tablet (1,000 mg total) by mouth 2 (two) times daily with a meal. 04/08/15  Yes Jessee Avers, MD  omeprazole (PRILOSEC) 20 MG capsule TAKE 1 CAPSULE BY MOUTH EVERY DAY 09/25/15  Yes Annia Belt, MD  pravastatin (PRAVACHOL) 40 MG tablet TAKE 1 TABLET BY MOUTH EVERY EVENING 01/30/15  Yes Jessee Avers, MD  rivaroxaban (XARELTO) 20 MG TABS tablet Take 1 tablet (20 mg total) by mouth daily with supper. 06/19/15  Yes Jessee Avers, MD  SANTYL ointment APPLY TO AFFECTED AREA DAILY 10/16/15  Yes [name], MD  traZODone (DESYREL) 150 MG tablet TAKE 1 TABLET BY MOUTH EVERY NIGHT AT BEDTIME 09/25/15  Yes Annia Belt, MD    Scheduled Meds: . amLODipine  10 mg Oral Daily  . gabapentin  300 mg Oral TID  . losartan  100 mg Oral Daily   And  . hydrochlorothiazide  25 mg Oral Daily  . insulin aspart  0-9 Units Subcutaneous TID WC  . [START ON 12/05/2015] polyethylene glycol-electrolytes  4,000 mL Oral Once  . pravastatin  40 mg Oral QPM  . rivaroxaban  20 mg Oral Q supper   Continuous Infusions: . sodium chloride 75 mL/hr at 12/04/15 1547  . sodium chloride     PRN Meds:.acetaminophen, acetaminophen-codeine  Allergies as of 12/03/2015 - Review Complete 12/03/2015  Allergen Reaction Noted  . Keflex [cephalexin] Itching and Swelling 08/19/2012  .  Clindamycin/lincomycin Itching 07/27/2014  . Latex Itching 09/01/2012  . Sulfa antibiotics Rash 08/16/2012  . Sulfur Rash 08/16/2012    Family History  Problem Relation Age of Onset  . Adopted: Yes  . Hypertension Mother     Social History   Social History  . Marital Status: Single    Spouse Name: N/A  . Number of Children: N/A  . Years of Education: N/A   Occupational History  . Not on file.   Social History Main Topics  . Smoking status: Current Every Day Smoker -- 0.25 packs/day for 44 years    Types: Cigarettes  . Smokeless tobacco: Never Used     Comment: 1/2 PPD  . Alcohol Use: No  .  Drug Use: No  . Sexual Activity: Yes   Other Topics Concern  . Not on file   Social History Narrative   Previously divorced now engaged with her partner of 4 years.  Has 6 grown children with 21 grandchildren.  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  Moved to [place] in August 2013 and was transiently homeless until first part of October 2013.      Review of Systems: All negative except as stated above in HPI.  Physical Exam: Vital signs: Filed Vitals:   12/04/15 1050 12/04/15 1401  BP: 172/82 161/78  Pulse: 73 76  Temp:  97.7 F (36.5 C)  Resp:  18   Last BM Date: 12/02/15 General:   Lethargic, obese, no acute distress Head: atraumatic Eyes: anicteric sclera ENT: oropharynx clear Neck: supple, nontender Lungs:  Clear throughout to auscultation.   No wheezes, crackles, or rhonchi. No acute distress. Heart:  Regular rate and rhythm; no murmurs, clicks, rubs,  or gallops. Abdomen: upper quadrant tenderness with guarding, soft, nondistended, +BS  Rectal:  Deferred Ext: no edema Skin: hyperpigmented on left abdomen  GI:  Lab Results:  Recent Labs  12/03/15 0200  WBC 10.6*  HGB 12.4  HCT 38.8  PLT 354   BMET  Recent Labs  12/03/15 0200 12/03/15 0546 12/04/15 1300  NA 142 141 139  K 2.7* 2.9* 3.8  CL 111 108 110   CO2 22 23 20*  GLUCOSE 94 78 117*  BUN 9 8 11   CREATININE 1.67* 1.62* 1.78*  CALCIUM 8.3* 8.2* 8.3*   LFT  Recent Labs  12/03/15 1040  PROT 6.0*  ALBUMIN 1.9*  AST 17  ALT 12*  ALKPHOS 121  BILITOT 0.5  BILIDIR 0.1  IBILI 0.4   PT/INR No results for input(s): LABPROT, INR in the last 72 hours.   Studies/Results: Ct Head Wo Contrast  12/03/2015  CLINICAL DATA:  Headache and hypertension. EXAM: CT HEAD WITHOUT CONTRAST TECHNIQUE: Contiguous axial images were obtained from the base of the skull through the vertex without intravenous contrast. COMPARISON:  08/17/2012 FINDINGS: Ventricles and sulci appear symmetrical. No ventricular dilatation. Mild periventricular low-attenuation change likely representing small vessel ischemia. Basal ganglia calcifications. No mass effect or midline shift. No abnormal extra-axial fluid collections. Gray-white matter junctions are distinct. Basal cisterns are not effaced. No evidence of acute intracranial hemorrhage. No depressed skull fractures. Visualized paranasal sinuses and mastoid air cells are not opacified. Vascular calcifications. IMPRESSION: No acute intracranial abnormalities. Low-attenuation changes in the deep white matter likely small vessel ischemia. Electronically Signed   By: Lucienne Capers M.D.   On: 12/03/2015 03:40   Ct Abdomen Pelvis W Contrast  12/04/2015  CLINICAL DATA:  Unexplained weight loss. EXAM: CT ABDOMEN AND PELVIS WITH CONTRAST TECHNIQUE: Multidetector CT imaging of the abdomen and pelvis was performed using the standard protocol following bolus administration of intravenous contrast. CONTRAST:  33mL OMNIPAQUE IOHEXOL 300 MG/ML  SOLN COMPARISON:  CT abdomen and pelvis 09/02/2012.  Chest CT 09/16/2014. FINDINGS: Mosaic lung attenuation is partially visualized in the lung bases as described on prior chest CT. There is no pleural effusion. The gallbladder is absent. Areas of splenic scarring are noted resulting in a nodular  contour and reflecting evolution of previous infarcts which were acute on the prior abdominal CT. The liver, adrenal glands, and pancreas are unremarkable. Subcentimeter hypodensities in the kidneys are similar to the prior CT and likely represent cysts. Calcifications in both  renal hila largely appear vascular. There is no hydronephrosis. Oral contrast is present in nondilated loops of small and large bowel to the level of the transverse colon without evidence of obstruction. The appendix is unremarkable. Diffuse atherosclerotic calcification is noted of the abdominal aorta and its major branch vessels. A 10 mm short axis right external iliac lymph node is stable to minimally larger than on the prior study. A 10 mm left external iliac lymph node has mildly enlarged and is upper limits of normal in size. No enlarged lymph nodes are identified elsewhere. There is no free fluid. Bladder is unremarkable. Prior hysterectomy. No pelvic mass is seen. Subcutaneous edema is noted, greatest in the flanks. Thoracolumbar spondylosis is noted. There is advanced right-sided facet arthrosis in the lower lumbar spine. IMPRESSION: 1. No acute abnormality or mass identified in the abdomen or pelvis. 2. 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 Electronically Signed   By: Logan Bores M.D.   On: 12/04/2015 06:52    Impression/Plan: 65 yo chronic abdominal pain, weight loss with a negative CT scan. 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 Needs EGD/colonoscopy due to her weight loss and abdominal pain. Will start colon prep tomorrow and do procedures on the morning of 12/06/15. Clear liquid diet tomorrow. Supportive care.    LOS: 1 day   [place] C.  12/04/2015, 4:16 PM  Pager [phone]  If no answer or after 5 PM call [phone]

## 2015-12-04 NOTE — Care Management Obs Status (Signed)
[place] NOTIFICATION   Patient Details  Name: Joy Butler MRN: [id number] Date of Birth: 1951/06/03   Medicare Observation Status Notification Given:  Yes    Maryclare Labrador, RN 12/04/2015, 4:32 PM

## 2015-12-04 NOTE — Progress Notes (Signed)
Subjective: Patient has continued bilateral frontal headache without any other symptoms. She was transitioned to her home oral medications this morning and weaned off the drip, now with pressures in the 140s-160s/80s on home oral meds.  Objective: Vital signs in last 24 hours: Filed Vitals:   12/03/15 1327 12/03/15 2054 12/04/15 0417 12/04/15 1050  BP: 152/76 172/84 171/85 172/82  Pulse: 83 82 89 73  Temp: 97.9 F (36.6 C) 98.2 F (36.8 C) 98.3 F (36.8 C)   TempSrc: Oral Oral Oral   Resp: 18 18 18    Height:      Weight:      SpO2: 98% 98% 95%    Weight change:   Intake/Output Summary (Last 24 hours) at 12/04/15 1139 Last data filed at 12/04/15 0947  Gross per 24 hour  Intake   1740 ml  Output   2450 ml  Net   -710 ml     Gen: Well-appearing, alert and oriented to person, place, and time HEENT: Oropharynx clear without erythema or exudate.  Neck: No cervical LAD, no thyromegaly or nodules, no JVD noted. CV: Normal rate, regular rhythm, no murmurs, rubs, or gallops Pulmonary: Normal effort, CTA bilaterally, no wheezing, rales, or rhonchi Abdominal: Soft, mild tenderness to palpation, non-distended, without rebound, guarding, or masses. 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 Extremities: Distal pulses 2+ in upper and lower extremities bilaterally, no tenderness, erythema or edema Skin: No atypical appearing moles. No rashes Neuro: CN II-XII grossly intact, PERRL, no focal neurologic weakness appreciated  Lab Results: Basic Metabolic Panel:  Recent Labs Lab 12/03/15 0200 12/03/15 0546  NA 142 141  K 2.7* 2.9*  CL 111 108  CO2 22 23  GLUCOSE 94 78  BUN 9 8  CREATININE 1.67* 1.62*  CALCIUM 8.3* 8.2*   Liver Function Tests:  Recent Labs Lab 12/03/15 1040  AST 17  ALT 12*  ALKPHOS 121  BILITOT 0.5  PROT 6.0*  ALBUMIN 1.9*   CBC:  Recent Labs Lab 12/03/15 0200  WBC 10.6*  NEUTROABS 8.0*  HGB 12.4  HCT 38.8  MCV 83.8  PLT 354   Cardiac  Enzymes:  Recent Labs Lab 12/03/15 0546 12/03/15 1212 12/03/15 1819  TROPONINI 0.10* 0.10* 0.08*   CBG:  Recent Labs Lab 12/03/15 0910 12/03/15 1118 12/03/15 1639 12/03/15 2051 12/04/15 0112 12/04/15 0414  GLUCAP 166* 83 96 151* 98 127*   Urine Drug Screen: Drugs of Abuse     Component Value Date/Time   LABOPIA POSITIVE* 10/12/2012 0803   COCAINSCRNUR NONE DETECTED 10/12/2012 0803   LABBENZ NONE DETECTED 10/12/2012 0803   AMPHETMU NONE DETECTED 10/12/2012 0803   THCU NONE DETECTED 10/12/2012 0803   LABBARB NONE DETECTED 10/12/2012 0803    Urinalysis:  Recent Labs Lab 12/03/15 0249  COLORURINE YELLOW  LABSPEC 1.009  PHURINE 7.0  GLUCOSEU 100*  HGBUR SMALL*  BILIRUBINUR NEGATIVE  KETONESUR NEGATIVE  PROTEINUR >300*  NITRITE NEGATIVE  LEUKOCYTESUR NEGATIVE   Studies/Results: Ct Head Wo Contrast  12/03/2015  CLINICAL DATA:  Headache and hypertension. EXAM: CT HEAD WITHOUT CONTRAST TECHNIQUE: Contiguous axial images were obtained from the base of the skull through the vertex without intravenous contrast. COMPARISON:  08/17/2012 FINDINGS: Ventricles and sulci appear symmetrical. No ventricular dilatation. Mild periventricular low-attenuation change likely representing small vessel ischemia. Basal ganglia calcifications. No mass effect or midline shift. No abnormal extra-axial fluid collections. Gray-white matter junctions are distinct. Basal cisterns are not effaced. No evidence of acute intracranial hemorrhage. No depressed skull fractures. Visualized  paranasal sinuses and mastoid air cells are not opacified. Vascular calcifications. IMPRESSION: No acute intracranial abnormalities. Low-attenuation changes in the deep white matter likely small vessel ischemia. Electronically Signed   By: Lucienne Capers M.D.   On: 12/03/2015 03:40   Ct Abdomen Pelvis W Contrast  12/04/2015  CLINICAL DATA:  Unexplained weight loss. EXAM: CT ABDOMEN AND PELVIS WITH CONTRAST TECHNIQUE:  Multidetector CT imaging of the abdomen and pelvis was performed using the standard protocol following bolus administration of intravenous contrast. CONTRAST:  33mL OMNIPAQUE IOHEXOL 300 MG/ML  SOLN COMPARISON:  CT abdomen and pelvis 09/02/2012.  Chest CT 09/16/2014. FINDINGS: Mosaic lung attenuation is partially visualized in the lung bases as described on prior chest CT. There is no pleural effusion. The gallbladder is absent. Areas of splenic scarring are noted resulting in a nodular contour and reflecting evolution of previous infarcts which were acute on the prior abdominal CT. The liver, adrenal glands, and pancreas are unremarkable. Subcentimeter hypodensities in the kidneys are similar to the prior CT and likely represent cysts. Calcifications in both renal hila largely appear vascular. There is no hydronephrosis. Oral contrast is present in nondilated loops of small and large bowel to the level of the transverse colon without evidence of obstruction. The appendix is unremarkable. Diffuse atherosclerotic calcification is noted of the abdominal aorta and its major branch vessels. A 10 mm short axis right external iliac lymph node is stable to minimally larger than on the prior study. A 10 mm left external iliac lymph node has mildly enlarged and is upper limits of normal in size. No enlarged lymph nodes are identified elsewhere. There is no free fluid. Bladder is unremarkable. Prior hysterectomy. No pelvic mass is seen. Subcutaneous edema is noted, greatest in the flanks. Thoracolumbar spondylosis is noted. There is advanced right-sided facet arthrosis in the lower lumbar spine. IMPRESSION: 1. No acute abnormality or mass identified in the abdomen or pelvis. 2. 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 Electronically Signed   By: Logan Bores M.D.   On: 12/04/2015 06:52   Assessment/Plan: Active Problems:   Hypertensive emergency  Symptomatic hypertension due to medication noncompliance: Patient was found  to have BP of 200s/100s, which was improved to 150s/90s on admission. She had headaches, elevated creatinine, and hypokalemia. She also has LAD on EKG. She is also on home xarelto but does not recall taking it. Other differentials for refractory (which this is not) htn would be secondary causes like RAS, cushings, primary hyperaldo, sleep apnea etc but they are less likely. Headache continues to improve. -Continue home oral antiHTN labs -Trend BMP, troponin -Tylenol #3 for headache -Follow-up secondary HTN labs  Lower abdominal pain with 50lb weight loss x 1 year - has had longstanding lower abdominal pain without clear etiology, called IBS in the past but with little improvement with previous measures, as well as 50lb unintentional weight loss over the past 10yr. Significantly hypoalbuminemic here under 2 suggesting malnourishment. CT A/P negative for masses or inflammation, showing only old splenic infarcts. -Consulted GI; appreciate recs -TTG, IgA -F/u AM cortisol  Hypoglycemia: most likely due to poor dietary intake and continued taking of home insulin regimen despite the decreased Po intake. Received amp of d50 in the er -On SS-S -Diabetes care consult  Dispo: Disposition is deferred at this time, awaiting improvement of current medical problems.  Anticipated discharge in approximately 1-2 day(s).   The patient does have a current PCP [name], MD) and does need an [facility] hospital follow-up appointment after  discharge.  The patient does not have transportation limitations that hinder transportation to clinic appointments.  LOS: 1 day   Norval Gable, MD 12/04/2015, 11:39 AM

## 2015-12-04 NOTE — Progress Notes (Signed)
Inpatient Diabetes Program Recommendations  AACE/ADA: New Consensus Statement on Inpatient Glycemic Control (2015)  Target Ranges:  Prepandial:   less than 140 mg/dL      Peak postprandial:   less than 180 mg/dL (1-2 hours)      Critically ill patients:  140 - 180 mg/dL   Review of Glycemic Control  Inpatient Diabetes Program Recommendations:  Correction (SSI): change to TID  Thank you  Raoul Pitch BSN, RN,CDE Inpatient Diabetes Coordinator [phone] (team pager)

## 2015-12-05 DIAGNOSIS — R1031 Right lower quadrant pain: Secondary | ICD-10-CM | POA: Diagnosis not present

## 2015-12-05 DIAGNOSIS — R7989 Other specified abnormal findings of blood chemistry: Secondary | ICD-10-CM | POA: Diagnosis present

## 2015-12-05 DIAGNOSIS — Z794 Long term (current) use of insulin: Secondary | ICD-10-CM | POA: Diagnosis not present

## 2015-12-05 DIAGNOSIS — E11649 Type 2 diabetes mellitus with hypoglycemia without coma: Secondary | ICD-10-CM | POA: Diagnosis present

## 2015-12-05 DIAGNOSIS — K2950 Unspecified chronic gastritis without bleeding: Secondary | ICD-10-CM | POA: Diagnosis present

## 2015-12-05 DIAGNOSIS — E1142 Type 2 diabetes mellitus with diabetic polyneuropathy: Secondary | ICD-10-CM | POA: Diagnosis present

## 2015-12-05 DIAGNOSIS — Z7901 Long term (current) use of anticoagulants: Secondary | ICD-10-CM | POA: Diagnosis not present

## 2015-12-05 DIAGNOSIS — Z9114 Patient's other noncompliance with medication regimen: Secondary | ICD-10-CM

## 2015-12-05 DIAGNOSIS — D124 Benign neoplasm of descending colon: Secondary | ICD-10-CM | POA: Diagnosis present

## 2015-12-05 DIAGNOSIS — E876 Hypokalemia: Secondary | ICD-10-CM | POA: Diagnosis present

## 2015-12-05 DIAGNOSIS — Z79899 Other long term (current) drug therapy: Secondary | ICD-10-CM | POA: Diagnosis not present

## 2015-12-05 DIAGNOSIS — Z8249 Family history of ischemic heart disease and other diseases of the circulatory system: Secondary | ICD-10-CM | POA: Diagnosis not present

## 2015-12-05 DIAGNOSIS — Z888 Allergy status to other drugs, medicaments and biological substances status: Secondary | ICD-10-CM | POA: Diagnosis not present

## 2015-12-05 DIAGNOSIS — R634 Abnormal weight loss: Secondary | ICD-10-CM | POA: Diagnosis present

## 2015-12-05 DIAGNOSIS — E46 Unspecified protein-calorie malnutrition: Secondary | ICD-10-CM | POA: Diagnosis not present

## 2015-12-05 DIAGNOSIS — Z833 Family history of diabetes mellitus: Secondary | ICD-10-CM | POA: Diagnosis not present

## 2015-12-05 DIAGNOSIS — D120 Benign neoplasm of cecum: Secondary | ICD-10-CM | POA: Diagnosis present

## 2015-12-05 DIAGNOSIS — E785 Hyperlipidemia, unspecified: Secondary | ICD-10-CM | POA: Diagnosis present

## 2015-12-05 DIAGNOSIS — G8929 Other chronic pain: Secondary | ICD-10-CM

## 2015-12-05 DIAGNOSIS — I160 Hypertensive urgency: Secondary | ICD-10-CM | POA: Diagnosis not present

## 2015-12-05 DIAGNOSIS — F17210 Nicotine dependence, cigarettes, uncomplicated: Secondary | ICD-10-CM | POA: Diagnosis present

## 2015-12-05 DIAGNOSIS — Z881 Allergy status to other antibiotic agents status: Secondary | ICD-10-CM | POA: Diagnosis not present

## 2015-12-05 DIAGNOSIS — Z86711 Personal history of pulmonary embolism: Secondary | ICD-10-CM | POA: Diagnosis not present

## 2015-12-05 DIAGNOSIS — M549 Dorsalgia, unspecified: Secondary | ICD-10-CM | POA: Diagnosis present

## 2015-12-05 DIAGNOSIS — N179 Acute kidney failure, unspecified: Secondary | ICD-10-CM | POA: Diagnosis present

## 2015-12-05 DIAGNOSIS — F329 Major depressive disorder, single episode, unspecified: Secondary | ICD-10-CM | POA: Diagnosis present

## 2015-12-05 DIAGNOSIS — Z91040 Latex allergy status: Secondary | ICD-10-CM | POA: Diagnosis not present

## 2015-12-05 DIAGNOSIS — I161 Hypertensive emergency: Secondary | ICD-10-CM | POA: Diagnosis present

## 2015-12-05 LAB — CBC
HCT: 35.7 % — ABNORMAL LOW (ref 36.0–46.0)
Hemoglobin: 11 g/dL — ABNORMAL LOW (ref 12.0–15.0)
MCH: 26.3 pg (ref 26.0–34.0)
MCHC: 30.8 g/dL (ref 30.0–36.0)
MCV: 85.2 fL (ref 78.0–100.0)
PLATELETS: 361 10*3/uL (ref 150–400)
RBC: 4.19 MIL/uL (ref 3.87–5.11)
RDW: 17.8 % — AB (ref 11.5–15.5)
WBC: 8.5 10*3/uL (ref 4.0–10.5)

## 2015-12-05 LAB — BASIC METABOLIC PANEL
Anion gap: 5 (ref 5–15)
BUN: 12 mg/dL (ref 6–20)
CALCIUM: 7.9 mg/dL — AB (ref 8.9–10.3)
CO2: 25 mmol/L (ref 22–32)
CREATININE: 1.95 mg/dL — AB (ref 0.44–1.00)
Chloride: 109 mmol/L (ref 101–111)
GFR calc non Af Amer: 26 mL/min — ABNORMAL LOW (ref >60)
GFR, EST AFRICAN AMERICAN: 30 mL/min — AB (ref >60)
Glucose, Bld: 147 mg/dL — ABNORMAL HIGH (ref 65–99)
Potassium: 4.2 mmol/L (ref 3.5–5.1)
Sodium: 139 mmol/L (ref 135–145)

## 2015-12-05 LAB — GLUCOSE, CAPILLARY
GLUCOSE-CAPILLARY: 113 mg/dL — AB (ref 65–99)
GLUCOSE-CAPILLARY: 119 mg/dL — AB (ref 65–99)
GLUCOSE-CAPILLARY: 96 mg/dL (ref 65–99)
GLUCOSE-CAPILLARY: 97 mg/dL (ref 65–99)
Glucose-Capillary: 133 mg/dL — ABNORMAL HIGH (ref 65–99)
Glucose-Capillary: 113 mg/dL — ABNORMAL HIGH (ref 65–99)

## 2015-12-05 LAB — MAGNESIUM: Magnesium: 2 mg/dL (ref 1.7–2.4)

## 2015-12-05 LAB — IGA: IgA: 581 mg/dL — ABNORMAL HIGH (ref 87–352)

## 2015-12-05 LAB — CORTISOL-AM, BLOOD: Cortisol - AM: 2.9 ug/dL — ABNORMAL LOW (ref 6.7–22.6)

## 2015-12-05 LAB — CORTISOL: Cortisol, Plasma: 9.6 ug/dL

## 2015-12-05 MED ORDER — ONDANSETRON HCL 4 MG/2ML IJ SOLN
4.0000 mg | Freq: Four times a day (QID) | INTRAMUSCULAR | Status: DC | PRN
  Administered 2015-12-05 – 2015-12-06 (×3): 4 mg via INTRAVENOUS
  Filled 2015-12-05 (×3): qty 2

## 2015-12-05 MED ORDER — COSYNTROPIN 0.25 MG IJ SOLR
0.2500 mg | Freq: Once | INTRAMUSCULAR | Status: AC
  Administered 2015-12-06: 0.25 mg via INTRAVENOUS
  Filled 2015-12-05: qty 0.25

## 2015-12-05 MED ORDER — HYDROCORTISONE NA SUCCINATE PF 100 MG IJ SOLR
50.0000 mg | Freq: Four times a day (QID) | INTRAMUSCULAR | Status: DC
  Filled 2015-12-05: qty 2

## 2015-12-05 MED ORDER — RIVAROXABAN 20 MG PO TABS: 20.0000 mg | ORAL_TABLET | Freq: Every day | ORAL | Status: DC

## 2015-12-05 NOTE — Care Management Note (Signed)
Case Management Note  Patient Details  Name: LILIANE BUSK MRN: [id number] Date of Birth: [birth year]  Subjective/Objective:     Pt admitted with hypertensive emergency               Action/Plan:  Pt is independent from home   Expected Discharge Date:                  Expected Discharge Plan:  Home/Self Care (Indpendent from home with use of cane for asistiance with ambulation)  In-House Referral:     Discharge planning Services  CM Consult  Post Acute Care Choice:    Choice offered to:     DME Arranged:  [place] rolling DME Agency:  [place]:  PT [facility] Agency:  [place]  Status of Service:  In process, will continue to follow  Medicare Important Message Given:    Date Medicare IM Given:    Medicare IM give by:    Date Additional Medicare IM Given:    Additional Medicare Important Message give by:     If discussed at [place] of Stay Meetings, dates discussed:    Additional Comments: CM offered pt choice for DME and HH, pt chose [facility].  CM requested that MD write HH/DME orders prior to discharge Maryclare Labrador, RN 12/05/2015, 4:10 PM

## 2015-12-05 NOTE — Progress Notes (Signed)
PT Cancellation Note  Patient Details Name: Joy Butler MRN: [id number] DOB: Apr 17, 1951   Cancelled Treatment:    Reason Eval/Treat Not Completed: Patient declined, no reason specified PT woke pt up from sleeping. Pt declines participating in PT treatment today due to having pain in abdomen/head and not feeling up to it. Will follow up.   [name] A [place] Police 12/05/2015, 1:41 PM Wray Kearns, [place], DPT [phone]

## 2015-12-05 NOTE — Progress Notes (Signed)
Patient lying in bed, no distress or pain, call light within reach. 

## 2015-12-05 NOTE — Progress Notes (Signed)
Subjective: Patient has continued bilateral frontal headache without any other symptoms that is improving over the past few days. She also still endorses mild persistent generalized abdominal pain and weakness. Pressures in the 150s-170s/80s on home oral meds.  Objective: Vital signs in last 24 hours: Filed Vitals:   12/04/15 1050 12/04/15 1401 12/04/15 2015 12/05/15 0435  BP: 172/82 161/78 178/96 148/72  Pulse: 73 76 83 88  Temp:  97.7 F (36.5 C) 97.3 F (36.3 C) 98.8 F (37.1 C)  TempSrc:  Oral Oral Oral  Resp:  18 18 17   Height:      Weight:      SpO2:  92% 100% 94%   Weight change:   Intake/Output Summary (Last 24 hours) at 12/05/15 0919 Last data filed at 12/04/15 2355  Gross per 24 hour  Intake    240 ml  Output   2050 ml  Net  -1810 ml     Gen: Well-appearing, alert and oriented to person, place, and time HEENT: Oropharynx clear without erythema or exudate.  Neck: No cervical LAD, no thyromegaly or nodules, no JVD noted. CV: Normal rate, regular rhythm, no murmurs, rubs, or gallops Pulmonary: Normal effort, CTA bilaterally, no wheezing, rales, or rhonchi Abdominal: Soft, mild tenderness to palpation, non-distended, without rebound, guarding, or masses. 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 Extremities: Distal pulses 2+ in upper and lower extremities bilaterally, no tenderness, erythema or edema Skin: No atypical appearing moles. No rashes Neuro: CN II-XII grossly intact, PERRL, no focal neurologic weakness appreciated  Lab Results: Basic Metabolic Panel:  Recent Labs Lab 12/04/15 1300 12/05/15 0349  NA 139 139  K 3.8 4.2  CL 110 109  CO2 20* 25  GLUCOSE 117* 147*  BUN 11 12  CREATININE 1.78* 1.95*  CALCIUM 8.3* 7.9*  MG 1.9 2.0   Liver Function Tests:  Recent Labs Lab 12/03/15 1040  AST 17  ALT 12*  ALKPHOS 121  BILITOT 0.5  PROT 6.0*  ALBUMIN 1.9*   CBC:  Recent Labs Lab 12/03/15 0200 12/05/15 0349  WBC 10.6* 8.5  NEUTROABS 8.0*  --     HGB 12.4 11.0*  HCT 38.8 35.7*  MCV 83.8 85.2  PLT 354 361   Cardiac Enzymes:  Recent Labs Lab 12/03/15 0546 12/03/15 1212 12/03/15 1819  TROPONINI 0.10* 0.10* 0.08*   CBG:  Recent Labs Lab 12/04/15 0821 12/04/15 1132 12/04/15 1553 12/04/15 2013 12/04/15 2348 12/05/15 0438  GLUCAP 123* 102* 111* 134* 113* 133*   Urine Drug Screen: Drugs of Abuse     Component Value Date/Time   LABOPIA POSITIVE* 10/12/2012 0803   COCAINSCRNUR NONE DETECTED 10/12/2012 0803   LABBENZ NONE DETECTED 10/12/2012 0803   AMPHETMU NONE DETECTED 10/12/2012 0803   THCU NONE DETECTED 10/12/2012 0803   LABBARB NONE DETECTED 10/12/2012 0803    Urinalysis:  Recent Labs Lab 12/03/15 0249  COLORURINE YELLOW  LABSPEC 1.009  PHURINE 7.0  GLUCOSEU 100*  HGBUR SMALL*  BILIRUBINUR NEGATIVE  KETONESUR NEGATIVE  PROTEINUR >300*  NITRITE NEGATIVE  LEUKOCYTESUR NEGATIVE   Studies/Results: Ct Abdomen Pelvis W Contrast  12/04/2015  CLINICAL DATA:  Unexplained weight loss. EXAM: CT ABDOMEN AND PELVIS WITH CONTRAST TECHNIQUE: Multidetector CT imaging of the abdomen and pelvis was performed using the standard protocol following bolus administration of intravenous contrast. CONTRAST:  89mL OMNIPAQUE IOHEXOL 300 MG/ML  SOLN COMPARISON:  CT abdomen and pelvis 09/02/2012.  Chest CT 09/16/2014. FINDINGS: Mosaic lung attenuation is partially visualized in the lung bases as described  on prior chest CT. There is no pleural effusion. The gallbladder is absent. Areas of splenic scarring are noted resulting in a nodular contour and reflecting evolution of previous infarcts which were acute on the prior abdominal CT. The liver, adrenal glands, and pancreas are unremarkable. Subcentimeter hypodensities in the kidneys are similar to the prior CT and likely represent cysts. Calcifications in both renal hila largely appear vascular. There is no hydronephrosis. Oral contrast is present in nondilated loops of small and large  bowel to the level of the transverse colon without evidence of obstruction. The appendix is unremarkable. Diffuse atherosclerotic calcification is noted of the abdominal aorta and its major branch vessels. A 10 mm short axis right external iliac lymph node is stable to minimally larger than on the prior study. A 10 mm left external iliac lymph node has mildly enlarged and is upper limits of normal in size. No enlarged lymph nodes are identified elsewhere. There is no free fluid. Bladder is unremarkable. Prior hysterectomy. No pelvic mass is seen. Subcutaneous edema is noted, greatest in the flanks. Thoracolumbar spondylosis is noted. There is advanced right-sided facet arthrosis in the lower lumbar spine. IMPRESSION: 1. No acute abnormality or mass identified in the abdomen or pelvis. 2. 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 Electronically Signed   By: Logan Bores M.D.   On: 12/04/2015 06:52   Assessment/Plan: Active Problems:   Hypertensive emergency   Unexplained weight loss  Symptomatic hypertension due to medication noncompliance: Patient was found to have BP of 200s/100s, which was improved to 150s/90s on admission. She had headaches, elevated creatinine, and hypokalemia. She also has LAD on EKG. She is also on home xarelto but does not recall taking it. Other differentials for refractory (which this is not) htn would be secondary causes like RAS, cushings, primary hyperaldo, sleep apnea etc but they are less likely. Headache continues to improve. -Continue home oral antiHTN labs -Trend BMP, troponin -Tylenol #3 for headache -Follow-up secondary HTN labs  Lower abdominal pain with 50lb weight loss x 1 year - has had longstanding lower abdominal pain without clear etiology, called IBS in the past but with little improvement with previous measures, as well as 50lb unintentional weight loss over the past 70yr. Significantly hypoalbuminemic here under 2 suggesting malnourishment. CT A/P negative for  masses or inflammation, showing only old splenic infarcts. AM cortisol initially low but was very early. Will re-check, optimally around 6am. -Consulted GI; appreciate recs - due for EGD and colonoscopy on 12/16 -TTG, IgA -F/u AM cortisol.  Hypoglycemia: most likely due to poor dietary intake and continued taking of home insulin regimen despite the decreased Po intake. Received amp of d50 in the er -On SS-S -Diabetes care consult  Dispo: Disposition is deferred at this time, awaiting improvement of current medical problems.  Anticipated discharge in approximately 1-2 day(s).   The patient does have a current PCP [name], MD) and does need an [facility] hospital follow-up appointment after discharge.  The patient does not have transportation limitations that hinder transportation to clinic appointments.  LOS: 2 days   Norval Gable, MD 12/05/2015, 9:19 AM

## 2015-12-06 ENCOUNTER — Encounter (HOSPITAL_COMMUNITY)
Admission: EM | Disposition: A | Discharge status: Home / Self Care | Payer: Self-pay | Source: Home / Self Care | Attending: Student in an Organized Health Care Education/Training Program

## 2015-12-06 ENCOUNTER — Inpatient Hospital Stay (HOSPITAL_COMMUNITY): Payer: Medicare Other

## 2015-12-06 ENCOUNTER — Encounter (HOSPITAL_COMMUNITY): Payer: Self-pay | Admitting: *Deleted

## 2015-12-06 ENCOUNTER — Inpatient Hospital Stay (HOSPITAL_COMMUNITY): Payer: Medicare Other | Admitting: Certified Registered Nurse Anesthetist

## 2015-12-06 HISTORY — PX: ESOPHAGOGASTRODUODENOSCOPY (EGD) WITH PROPOFOL: SHX5813

## 2015-12-06 HISTORY — PX: COLONOSCOPY WITH PROPOFOL: SHX5780

## 2015-12-06 LAB — GLUCOSE, CAPILLARY
GLUCOSE-CAPILLARY: 121 mg/dL — AB (ref 65–99)
GLUCOSE-CAPILLARY: 119 mg/dL — AB (ref 65–99)
GLUCOSE-CAPILLARY: 107 mg/dL — AB (ref 65–99)
GLUCOSE-CAPILLARY: 128 mg/dL — AB (ref 65–99)

## 2015-12-06 LAB — ACTH: C206 ACTH: 26.9 pg/mL (ref 7.2–63.3)

## 2015-12-06 LAB — ACTH STIMULATION, 3 TIME POINTS
Cortisol, 30 Min: 14.2 ug/dL
Cortisol, 60 Min: 18.7 ug/dL
Cortisol, Base: 6.2 ug/dL

## 2015-12-06 SURGERY — ESOPHAGOGASTRODUODENOSCOPY (EGD) WITH PROPOFOL
Anesthesia: Monitor Anesthesia Care

## 2015-12-06 MED ORDER — OMEPRAZOLE 20 MG PO CPDR
20.0000 mg | DELAYED_RELEASE_CAPSULE | Freq: Two times a day (BID) | ORAL | Status: DC
Start: 2015-12-06 — End: 2016-03-07

## 2015-12-06 MED ORDER — FENTANYL CITRATE (PF) 100 MCG/2ML IJ SOLN
25.0000 ug | INTRAMUSCULAR | Status: DC | PRN
Start: 2015-12-06 — End: 2015-12-06

## 2015-12-06 MED ORDER — LABETALOL HCL 5 MG/ML IV SOLN
INTRAVENOUS | Status: AC
  Filled 2015-12-06: qty 4

## 2015-12-06 MED ORDER — PROPOFOL 10 MG/ML IV BOLUS
INTRAVENOUS | Status: DC | PRN
  Administered 2015-12-06: 20 mg via INTRAVENOUS

## 2015-12-06 MED ORDER — PROMETHAZINE HCL 25 MG/ML IJ SOLN: 6.2500 mg | INTRAMUSCULAR | Status: DC | PRN

## 2015-12-06 MED ORDER — PROPOFOL 500 MG/50ML IV EMUL
INTRAVENOUS | Status: DC | PRN
  Administered 2015-12-06: 60 ug/kg/min via INTRAVENOUS

## 2015-12-06 MED ORDER — BUTAMBEN-TETRACAINE-BENZOCAINE 2-2-14 % EX AERO
INHALATION_SPRAY | CUTANEOUS | Status: DC | PRN
  Administered 2015-12-06: 2 via TOPICAL

## 2015-12-06 MED ORDER — PANTOPRAZOLE SODIUM 40 MG PO TBEC
40.0000 mg | DELAYED_RELEASE_TABLET | Freq: Two times a day (BID) | ORAL | Status: DC
  Administered 2015-12-06: 40 mg via ORAL
  Filled 2015-12-06: qty 1

## 2015-12-06 MED ORDER — LABETALOL HCL 5 MG/ML IV SOLN
10.0000 mg | Freq: Once | INTRAVENOUS | Status: AC
  Administered 2015-12-06: 10 mg via INTRAVENOUS

## 2015-12-06 MED ORDER — LACTATED RINGERS IV SOLN
INTRAVENOUS | Status: DC | PRN
  Administered 2015-12-06: 11:00:00 via INTRAVENOUS

## 2015-12-06 NOTE — H&P (View-Only) (Signed)
Referring Provider: Dr. Evette Doffing Primary Care Physician:  [name], MD Primary Gastroenterologist:  Althia Forts  Reason for Consultation:  Abdominal pain; Weight loss  HPI: Joy Butler is a 64 y.o. female admitted for hypertensive emergency and seen for a GI consult due to abdominal pain and weight loss. Reports diffuse abdominal pain for over a year that feels like a dull ache. Eating makes the pain worse and when she does not eat she reports that she does not have the pain. Has postprandial diarrhea that is nonbloody. Denies melena or hematochezia. Abdominal pain improves after having a loose stool. Sometimes has soft stools. Decreased appetite for months. Reports a 50 pound unintentional weight loss over the past 3 months. She thinks she had a normal colonoscopy in [place] one year ago (records not available). No acute findings on abd/pelvis CT.    Past Medical History  Diagnosis Date  . Hypertension   . Pulmonary embolism ([place])     2011 treated at [facility] in [place].  Was on Coumadin for over a year.  no known family history  . UTI (urinary tract infection)   . High cholesterol   . 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 Depression   . Neuropathy ([facility])     feet  . Kidney stones   . Pneumonia     "several times" (07/26/2014)  . Type II diabetes mellitus ([place]) dx'd 2000  . Daily headache   . Migraine     "last one was in the 1990's" (07/26/2014)  . Arthritis     "knees" (07/26/2014)  . Chronic back pain   . Cataract of both eyes   . Hypertensive emergency 12/03/2015    Past Surgical History  Procedure Laterality Date  . Cholecystectomy      1980  . Esophagogastroduodenoscopy  08/19/2012    Procedure: ESOPHAGOGASTRODUODENOSCOPY (EGD);  Surgeon: Winfield Cunas., MD;  Location[facility] ENDOSCOPY;  Service: Endoscopy;  Laterality: N/A;  . Cesarean section  1982  . Carpal tunnel release Bilateral   . Tonsillectomy    . Artery biopsy Right 05/09/2014    Procedure:  BIOPSY TEMPORAL ARTERY;  Surgeon: Rosetta Posner, MD;  Location: [place];  Service: Vascular;  Laterality: Right;  . Appendectomy    . Vaginal hysterectomy    . Dilation and curettage of uterus  1982  . Cystoscopy w/ stone manipulation    . Breast biopsy Left   . Temporal artery biopsy / ligation Right 04/2014  . Eye surgery Bilateral   . Refractive surgery Bilateral     Prior to Admission medications   Medication Sig Start Date End Date Taking? Authorizing Provider  acetaminophen-codeine (TYLENOL #3) 300-30 MG tablet TAKE 1 TO 2 TABLETS BY MOUTH EVERY 6 HOURS AS NEEDED FOR MODERATE PAIN **MUST LAST 30 DAYS** 10/23/15  Yes Bartholomew Crews, MD  albuterol (PROVENTIL HFA;VENTOLIN HFA) 108 (90 BASE) MCG/ACT inhaler Inhale 2 puffs into the lungs once. Patient taking differently: Inhale 2 puffs into the lungs every 6 (six) hours as needed for wheezing or shortness of breath.  09/23/15  Yes Merrily Pew, MD  amLODipine (NORVASC) 10 MG tablet TAKE 1 TABLET BY MOUTH EVERY DAY 10/23/15  Yes Bartholomew Crews, MD  benzonatate (TESSALON) 100 MG capsule TAKE 1 CAPSULE BY MOUTH THREE TIMES A DAY AS NEEDED FOR COUGH 10/18/15  Yes Shela Leff, MD  dicyclomine (BENTYL) 20 MG tablet TAKE 1 TABLET BY MOUTH FOUR TIMES A DAY 10/18/15  Yes Shela Leff, MD  gabapentin (NEURONTIN) 300 MG capsule TAKE 1 CAPSULE BY MOUTH THREE TIMES A DAY 09/25/15  Yes [name], MD  guaiFENesin (MUCINEX) 600 MG 12 hr tablet Take 1 tablet (600 mg total) by mouth 2 (two) times daily. Patient taking differently: Take 600 mg by mouth 2 (two) times daily as needed for cough or to loosen phlegm.  10/04/15 10/03/16 Yes Shela Leff, MD  HYDROcodone-homatropine [facility]) 5-1.5 MG/5ML syrup Take 5 mLs by mouth every 6 (six) hours as needed for cough. 09/23/15  Yes Merrily Pew, MD  ibuprofen (ADVIL,MOTRIN) 800 MG tablet Take 1 tablet (800 mg total) by mouth 3 (three) times daily. Patient taking differently: Take 800 mg by  mouth every 8 (eight) hours as needed for moderate pain.  09/23/15  Yes Merrily Pew, MD  insulin NPH-regular Human (NOVOLIN 70/30) (70-30) 100 UNIT/ML injection Inject 50 Units into the skin 2 (two) times daily with a meal. Patient taking differently: Inject 45 Units into the skin 2 (two) times daily with a meal.  05/22/15  Yes Jessee Avers, MD  loratadine (CLARITIN) 10 MG tablet Take 1 tablet (10 mg total) by mouth daily. 08/19/15 08/18/16 Yes [name], MD  losartan-hydrochlorothiazide (HYZAAR) 100-25 MG per tablet Take 1 tablet by mouth daily. 04/08/15  Yes Jessee Avers, MD  metFORMIN (GLUCOPHAGE) 1000 MG tablet Take 1 tablet (1,000 mg total) by mouth 2 (two) times daily with a meal. 04/08/15  Yes Jessee Avers, MD  omeprazole (PRILOSEC) 20 MG capsule TAKE 1 CAPSULE BY MOUTH EVERY DAY 09/25/15  Yes Annia Belt, MD  pravastatin (PRAVACHOL) 40 MG tablet TAKE 1 TABLET BY MOUTH EVERY EVENING 01/30/15  Yes Jessee Avers, MD  rivaroxaban (XARELTO) 20 MG TABS tablet Take 1 tablet (20 mg total) by mouth daily with supper. 06/19/15  Yes Jessee Avers, MD  SANTYL ointment APPLY TO AFFECTED AREA DAILY 10/16/15  Yes [name], MD  traZODone (DESYREL) 150 MG tablet TAKE 1 TABLET BY MOUTH EVERY NIGHT AT BEDTIME 09/25/15  Yes Annia Belt, MD    Scheduled Meds: . amLODipine  10 mg Oral Daily  . gabapentin  300 mg Oral TID  . losartan  100 mg Oral Daily   And  . hydrochlorothiazide  25 mg Oral Daily  . insulin aspart  0-9 Units Subcutaneous TID WC  . [START ON 12/05/2015] polyethylene glycol-electrolytes  4,000 mL Oral Once  . pravastatin  40 mg Oral QPM  . rivaroxaban  20 mg Oral Q supper   Continuous Infusions: . sodium chloride 75 mL/hr at 12/04/15 1547  . sodium chloride     PRN Meds:.acetaminophen, acetaminophen-codeine  Allergies as of 12/03/2015 - Review Complete 12/03/2015  Allergen Reaction Noted  . Keflex [cephalexin] Itching and Swelling 08/19/2012  .  Clindamycin/lincomycin Itching 07/27/2014  . Latex Itching 09/01/2012  . Sulfa antibiotics Rash 08/16/2012  . Sulfur Rash 08/16/2012    Family History  Problem Relation Age of Onset  . Adopted: Yes  . Hypertension Mother     Social History   Social History  . Marital Status: Single    Spouse Name: N/A  . Number of Children: N/A  . Years of Education: N/A   Occupational History  . Not on file.   Social History Main Topics  . Smoking status: Current Every Day Smoker -- 0.25 packs/day for 44 years    Types: Cigarettes  . Smokeless tobacco: Never Used     Comment: 1/2 PPD  . Alcohol Use: No  .  Drug Use: No  . Sexual Activity: Yes   Other Topics Concern  . Not on file   Social History Narrative   Previously divorced now engaged with her partner of 4 years.  Has 6 grown children with 21 grandchildren.  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  Moved to [place] in August 2013 and was transiently homeless until first part of October 2013.      Review of Systems: All negative except as stated above in HPI.  Physical Exam: Vital signs: Filed Vitals:   12/04/15 1050 12/04/15 1401  BP: 172/82 161/78  Pulse: 73 76  Temp:  97.7 F (36.5 C)  Resp:  18   Last BM Date: 12/02/15 General:   Lethargic, obese, no acute distress Head: atraumatic Eyes: anicteric sclera ENT: oropharynx clear Neck: supple, nontender Lungs:  Clear throughout to auscultation.   No wheezes, crackles, or rhonchi. No acute distress. Heart:  Regular rate and rhythm; no murmurs, clicks, rubs,  or gallops. Abdomen: upper quadrant tenderness with guarding, soft, nondistended, +BS  Rectal:  Deferred Ext: no edema Skin: hyperpigmented on left abdomen  GI:  Lab Results:  Recent Labs  12/03/15 0200  WBC 10.6*  HGB 12.4  HCT 38.8  PLT 354   BMET  Recent Labs  12/03/15 0200 12/03/15 0546 12/04/15 1300  NA 142 141 139  K 2.7* 2.9* 3.8  CL 111 108 110   CO2 22 23 20*  GLUCOSE 94 78 117*  BUN 9 8 11   CREATININE 1.67* 1.62* 1.78*  CALCIUM 8.3* 8.2* 8.3*   LFT  Recent Labs  12/03/15 1040  PROT 6.0*  ALBUMIN 1.9*  AST 17  ALT 12*  ALKPHOS 121  BILITOT 0.5  BILIDIR 0.1  IBILI 0.4   PT/INR No results for input(s): LABPROT, INR in the last 72 hours.   Studies/Results: Ct Head Wo Contrast  12/03/2015  CLINICAL DATA:  Headache and hypertension. EXAM: CT HEAD WITHOUT CONTRAST TECHNIQUE: Contiguous axial images were obtained from the base of the skull through the vertex without intravenous contrast. COMPARISON:  08/17/2012 FINDINGS: Ventricles and sulci appear symmetrical. No ventricular dilatation. Mild periventricular low-attenuation change likely representing small vessel ischemia. Basal ganglia calcifications. No mass effect or midline shift. No abnormal extra-axial fluid collections. Gray-white matter junctions are distinct. Basal cisterns are not effaced. No evidence of acute intracranial hemorrhage. No depressed skull fractures. Visualized paranasal sinuses and mastoid air cells are not opacified. Vascular calcifications. IMPRESSION: No acute intracranial abnormalities. Low-attenuation changes in the deep white matter likely small vessel ischemia. Electronically Signed   By: Lucienne Capers M.D.   On: 12/03/2015 03:40   Ct Abdomen Pelvis W Contrast  12/04/2015  CLINICAL DATA:  Unexplained weight loss. EXAM: CT ABDOMEN AND PELVIS WITH CONTRAST TECHNIQUE: Multidetector CT imaging of the abdomen and pelvis was performed using the standard protocol following bolus administration of intravenous contrast. CONTRAST:  70mL OMNIPAQUE IOHEXOL 300 MG/ML  SOLN COMPARISON:  CT abdomen and pelvis 09/02/2012.  Chest CT 09/16/2014. FINDINGS: Mosaic lung attenuation is partially visualized in the lung bases as described on prior chest CT. There is no pleural effusion. The gallbladder is absent. Areas of splenic scarring are noted resulting in a nodular  contour and reflecting evolution of previous infarcts which were acute on the prior abdominal CT. The liver, adrenal glands, and pancreas are unremarkable. Subcentimeter hypodensities in the kidneys are similar to the prior CT and likely represent cysts. Calcifications in both  renal hila largely appear vascular. There is no hydronephrosis. Oral contrast is present in nondilated loops of small and large bowel to the level of the transverse colon without evidence of obstruction. The appendix is unremarkable. Diffuse atherosclerotic calcification is noted of the abdominal aorta and its major branch vessels. A 10 mm short axis right external iliac lymph node is stable to minimally larger than on the prior study. A 10 mm left external iliac lymph node has mildly enlarged and is upper limits of normal in size. No enlarged lymph nodes are identified elsewhere. There is no free fluid. Bladder is unremarkable. Prior hysterectomy. No pelvic mass is seen. Subcutaneous edema is noted, greatest in the flanks. Thoracolumbar spondylosis is noted. There is advanced right-sided facet arthrosis in the lower lumbar spine. IMPRESSION: 1. No acute abnormality or mass identified in the abdomen or pelvis. 2. 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 Electronically Signed   By: Logan Bores M.D.   On: 12/04/2015 06:52    Impression/Plan: 64 yo chronic abdominal pain, weight loss with a negative CT scan. 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 Needs EGD/colonoscopy due to her weight loss and abdominal pain. Will start colon prep tomorrow and do procedures on the morning of 12/06/15. Clear liquid diet tomorrow. Supportive care.    LOS: 1 day   Arrey C.  12/04/2015, 4:16 PM  Pager [phone]  If no answer or after 5 PM call [phone]

## 2015-12-06 NOTE — Anesthesia Postprocedure Evaluation (Signed)
Anesthesia Post Note  Patient: Joy Butler  Procedure(s) Performed: Procedure(s) (LRB): ESOPHAGOGASTRODUODENOSCOPY (EGD) WITH PROPOFOL (N/A) COLONOSCOPY WITH PROPOFOL (N/A)  Patient location during evaluation: PACU Anesthesia Type: MAC Level of consciousness: awake and alert, awake and oriented Pain management: pain level controlled Vital Signs Assessment: post-procedure vital signs reviewed and stable Respiratory status: spontaneous breathing, nonlabored ventilation, respiratory function stable and patient connected to nasal cannula oxygen Cardiovascular status: stable and blood pressure returned to baseline Anesthetic complications: no    Last Vitals:  Filed Vitals:   12/06/15 1150 12/06/15 1344  BP: 172/83 163/76  Pulse: 80 78  Temp:  36.7 C  Resp: 13 16    Last Pain:  Filed Vitals:   12/06/15 1345  PainSc: [place] [facility]

## 2015-12-06 NOTE — Progress Notes (Signed)
Patient D/C home,taxi voucher given.

## 2015-12-06 NOTE — Progress Notes (Signed)
Subjective: Patient has continued bilateral frontal headache without any other symptoms which she says is persistent but improving, relieved with Tylenol #3. She also still endorses mild persistent generalized abdominal pain and weakness. Pressures improved on home oral meds, not requiring PRN IV meds.  Objective: Vital signs in last 24 hours: Filed Vitals:   12/05/15 0435 12/05/15 1406 12/05/15 2045 12/06/15 0347  BP: 148/72 137/65 166/85 147/69  Pulse: 88 77 82 87  Temp: 98.8 F (37.1 C) 97.8 F (36.6 C) 98 F (36.7 C) 98.7 F (37.1 C)  TempSrc: Oral Oral Oral Oral  Resp: 17 16 18 16   Height:      Weight:      SpO2: 94% 99% 100% 94%   Weight change:   Intake/Output Summary (Last 24 hours) at 12/06/15 0840 Last data filed at 12/05/15 2046  Gross per 24 hour  Intake    120 ml  Output      1 ml  Net    119 ml     Gen: Well-appearing, alert and oriented to person, place, and time HEENT: Oropharynx clear without erythema or exudate.  Neck: No cervical LAD, no thyromegaly or nodules, no JVD noted. CV: Normal rate, regular rhythm, no murmurs, rubs, or gallops Pulmonary: Normal effort, CTA bilaterally, no wheezing, rales, or rhonchi Abdominal: Soft, mild tenderness to palpation, non-distended, without rebound, guarding, or masses. 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 Extremities: Distal pulses 2+ in upper and lower extremities bilaterally, no tenderness, erythema or edema Skin: No atypical appearing moles. No rashes Neuro: CN II-XII grossly intact, PERRL, no focal neurologic weakness appreciated  Lab Results: Basic Metabolic Panel:  Recent Labs Lab 12/04/15 1300 12/05/15 0349  NA 139 139  K 3.8 4.2  CL 110 109  CO2 20* 25  GLUCOSE 117* 147*  BUN 11 12  CREATININE 1.78* 1.95*  CALCIUM 8.3* 7.9*  MG 1.9 2.0   Liver Function Tests:  Recent Labs Lab 12/03/15 1040  AST 17  ALT 12*  ALKPHOS 121  BILITOT 0.5  PROT 6.0*  ALBUMIN 1.9*   CBC:  Recent Labs Lab  12/03/15 0200 12/05/15 0349  WBC 10.6* 8.5  NEUTROABS 8.0*  --   HGB 12.4 11.0*  HCT 38.8 35.7*  MCV 83.8 85.2  PLT 354 361   Cardiac Enzymes:  Recent Labs Lab 12/03/15 0546 12/03/15 1212 12/03/15 1819  TROPONINI 0.10* 0.10* 0.08*   CBG:  Recent Labs Lab 12/05/15 1138 12/05/15 1614 12/05/15 2026 12/05/15 2348 12/06/15 0353 12/06/15 0809  GLUCAP 119* 113* 96 97 121* 119*   Urine Drug Screen: Drugs of Abuse     Component Value Date/Time   LABOPIA POSITIVE* 10/12/2012 0803   COCAINSCRNUR NONE DETECTED 10/12/2012 0803   LABBENZ NONE DETECTED 10/12/2012 0803   AMPHETMU NONE DETECTED 10/12/2012 0803   THCU NONE DETECTED 10/12/2012 0803   LABBARB NONE DETECTED 10/12/2012 0803    Urinalysis:  Recent Labs Lab 12/03/15 0249  COLORURINE YELLOW  LABSPEC 1.009  PHURINE 7.0  GLUCOSEU 100*  HGBUR SMALL*  BILIRUBINUR NEGATIVE  KETONESUR NEGATIVE  PROTEINUR >300*  NITRITE NEGATIVE  LEUKOCYTESUR NEGATIVE   Studies/Results: No results found. Assessment/Plan: Active Problems:   Hypertensive emergency   Unexplained weight loss  Symptomatic hypertension due to medication noncompliance: Patient was found to have BP of 200s/100s, which was improved to 150s/90s on admission. She had headaches, elevated creatinine, and hypokalemia. She also has LAD on EKG. She is also on home xarelto but does not recall taking it.  Other differentials for refractory (which this is not) htn would be secondary causes like RAS, cushings, primary hyperaldo, sleep apnea etc but they are less likely. Headache continues to improve. -Continue home oral antiHTN labs -Trend BMP, troponin -Tylenol #3 for headache -Follow-up secondary HTN labs  Lower abdominal pain with 50lb weight loss x 1 year - has had longstanding lower abdominal pain without clear etiology, called IBS in the past but with little improvement with previous measures, as well as 50lb unintentional weight loss over the past 65yr.  Significantly hypoalbuminemic here under 2 suggesting malnourishment. CT A/P negative for masses or inflammation, showing only old splenic infarcts. AM cortisol initially low but was very early. IgA elevated. ACTH stimulation test shows appropriate cortisol response, but still suppressed. -Consulted GI; appreciate recs - due for EGD and colonoscopy on 12/16 -F/u anti-TTG, ACTH -Given constant headache with profound weight loss, would like to obtain MRI brain w+w/o contrast with thin slices along the HPA for evaluation of the HPA in particular or other possible masses  Dispo: Disposition is deferred at this time, awaiting improvement of current medical problems.  Anticipated discharge in approximately 1-2 day(s).   The patient does have a current PCP [name], MD) and does need an [facility] hospital follow-up appointment after discharge.  The patient does not have transportation limitations that hinder transportation to clinic appointments.  LOS: 3 days   Norval Gable, MD 12/06/2015, 8:40 AM

## 2015-12-06 NOTE — Brief Op Note (Signed)
4 Colon polyps removed; No source of weight loss seen on EGD/colon. See endopro for details. No further GI workup at this time. Advance diet. Will sign off. Call if questions.

## 2015-12-06 NOTE — Discharge Instructions (Signed)
Joy Butler,  Please make sure to take your blood pressure medications every day as prescribed. Start taking your acid reflux medication, Prilosec (omeprazole) twice a day for this month before decreasing back down to your regular dose.  You have a follow-up appointment in our clinic on Thursday 12/22 at 8:45am. We will follow up with you then.  Let us know if you have any other questions.  Thanks. Blane Ohara

## 2015-12-06 NOTE — Op Note (Signed)
[facility] [place], [address]   COLONOSCOPY PROCEDURE REPORT     EXAM DATE: 12/21/15  PATIENT NAME:      Joy Butler, Joy Butler           MR #:      [id number]  BIRTHDATE:       [birth year]      VISIT #:     [phone]  ATTENDING:     Wilford Corner, MD     STATUS:     inpatient REFERRING MD: ASA CLASS:        Class III  INDICATIONS:  The patient is a 64 yr old female here for a colonoscopy due to abdominal pain, chronic diarrhea, and weight loss. PROCEDURE PERFORMED:     Colonoscopy with snare polypectomy MEDICATIONS:     Monitored anesthesia care and Per Anesthesia  ESTIMATED BLOOD LOSS:     None  CONSENT: The patient understands the risks and benefits of the procedure and understands that these risks include, but are not limited to: sedation, allergic reaction, infection, perforation and/or bleeding. Alternative means of evaluation and treatment include, among others: physical exam, x-rays, and/or surgical intervention. The patient elects to proceed with this endoscopic procedure.  DESCRIPTION OF PROCEDURE: During intra-op preparation period all mechanical & medical equipment was checked for proper function. Hand hygiene and appropriate measures for infection prevention was taken. After the risks, benefits and alternatives of the procedure were thoroughly explained, Informed consent was verified, confirmed and timeout was successfully executed by the treatment team. A digital exam revealed no abnormalities of the rectum.      The Pentax Ped Colon [id number] endoscope was introduced through the anus and advanced to the cecum, which was identified by both the appendix and ileocecal valve. Terminal ileum was intubated and was normal in appearance. The prep was fair.. The instrument was then slowly withdrawn as the colon was fully examined. Estimated blood loss is zero unless otherwise noted in this procedure report. 6 mm  sessile cecal polyp removed with snare cautery. Three sessile and semi-sessile descending colon polyps removed with snare cautery that range in size from 4 mm - 1 cm. A 4 mm semi-sessile polyp removed with snare cautery and not able to be retrieved. Retroflexed views revealed small internal hemorrhoids.  The scope was then completely withdrawn from the patient and the procedure terminated.     ADVERSE EVENTS:      There were no immediate complications.   IMPRESSIONS:     Colon polyps X 4 - s/p snare cautery Internal hemorrhoids  RECOMMENDATIONS:     F/U on path; Advance diet; Avoid NSAIDs/Aspirin for the next 2 weeks   Wilford Corner, MD eSigned:  Wilford Corner, MD 2015/12/21 11:38 AM   cc:  CPT CODES: ICD CODES:  The ICD and CPT codes recommended by this software are interpretations from the data that the clinical staff has captured with the software.  The verification of the translation of this report to the ICD and CPT codes and modifiers is the sole responsibility of the health care institution and practicing physician where this report was generated.  [employer]. will not be held responsible for the validity of the ICD and CPT codes included on this report.  AMA assumes no liability for data contained or not contained herein. CPT is a Designer, television/film set of the [employer].  PATIENT NAME:  Joy Butler, Joy Butler MR#: [id number]

## 2015-12-06 NOTE — Progress Notes (Signed)
PT Cancellation Note  Patient Details Name: Joy Butler MRN: [id number] DOB: 07/24/1951   Cancelled Treatment:    Reason Eval/Treat Not Completed: Patient at procedure or test/unavailable Pt off floor at test. Will f/u.   [name] [name] 12/06/2015, 10:12 AM  Wray Kearns, PT, DPT [phone]

## 2015-12-06 NOTE — Progress Notes (Signed)
Pt to go to MRI post recovery; Sonia Baller (pts primary care RN) notified during report

## 2015-12-06 NOTE — Interval H&P Note (Signed)
History and Physical Interval Note:  12/06/2015 10:30 AM  Joy Butler  has presented today for surgery, with the diagnosis of abd pain; wt loss  The various methods of treatment have been discussed with the patient and family. After consideration of risks, benefits and other options for treatment, the patient has consented to  Procedure(s): ESOPHAGOGASTRODUODENOSCOPY (EGD) WITH PROPOFOL (N/A) COLONOSCOPY WITH PROPOFOL (N/A) as a surgical intervention .  The patient's history has been reviewed, patient examined, no change in status, stable for surgery.  I have reviewed the patient's chart and labs.  Questions were answered to the patient's satisfaction.     [place] C.

## 2015-12-06 NOTE — Op Note (Signed)
[facility] [place], [address]   ENDOSCOPY PROCEDURE REPORT  PATIENT: Joy, Butler  MR#: [id number] BIRTHDATE: Jun 14, 1951 , 65  yrs. old GENDER: female ENDOSCOPIST: Wilford Corner, MD REFERRED BY: PROCEDURE DATE:  12/30/15 PROCEDURE:  EGD, diagnostic ASA CLASS:     Class III INDICATIONS:  abdominal pain and weight loss. MEDICATIONS: Monitored anesthesia care and Per Anesthesia TOPICAL ANESTHETIC:  DESCRIPTION OF PROCEDURE: After the risks benefits and alternatives of the procedure were thoroughly explained, informed consent was obtained.  The Pentax Gastroscope [id number] endoscope was introduced through the mouth and advanced to the second portion of the duodenum , Without limitations.  The instrument was slowly withdrawn as the mucosa was fully examined. Estimated blood loss is zero unless otherwise noted in this procedure report.    Esophagus normal. GEJ 42 cm from the incisors. Multiple flat red spots and patchy tiny erosions in body and antrum with antral edema and patchy erythema c/w gastritis. Duodenal bulb and 2nd portion of duodenum normal in appearance.       Retroflexed views revealed no abnormalities. Clear bilious fluid seen.     The scope was then withdrawn from the patient and the procedure completed.  COMPLICATIONS: There were no immediate complications.  ENDOSCOPIC IMPRESSION:     Gastritis and gastric erosions  RECOMMENDATIONS:     PPI PO BID; Avoid NSAIDs; Supportive care    eSigned:  Wilford Corner, MD 12/30/15 11:31 AM    CC:  CPT CODES: ICD CODES:  The ICD and CPT codes recommended by this software are interpretations from the data that the clinical staff has captured with the software.  The verification of the translation of this report to the ICD and CPT codes and modifiers is the sole responsibility of the health care institution and practicing physician where this report was  generated.  [employer]. will not be held responsible for the validity of the ICD and CPT codes included on this report.  AMA assumes no liability for data contained or not contained herein. CPT is a Designer, television/film set of the [employer].  PATIENT NAME:  Joy, Butler MR#: [id number]

## 2015-12-06 NOTE — Progress Notes (Signed)
Discharge order received.  IV removed and catheter intact.  Discharge education given to Pt.  Instructed pt to not use ibuprofen or aspirin products for 2 weeks following polyp removal.  Instructions written on pt's discharge paperwork.  Pt indicates understanding.  Will be discharged home with home health services.  Pt awaiting ride home.  Will cont to monitor.

## 2015-12-06 NOTE — Progress Notes (Signed)
Patient was nauseated and was given Zofran. Will continue to monitor

## 2015-12-06 NOTE — Transfer of Care (Signed)
Immediate Anesthesia Transfer of Care Note  Patient: Joy Butler  Procedure(s) Performed: Procedure(s): ESOPHAGOGASTRODUODENOSCOPY (EGD) WITH PROPOFOL (N/A) COLONOSCOPY WITH PROPOFOL (N/A)  Patient Location: PACU  Anesthesia Type:MAC  Level of Consciousness: awake  Airway & Oxygen Therapy: Patient Spontanous Breathing  Post-op Assessment: Report given to RN and Post -op Vital signs reviewed and stable  Post vital signs: stable  Last Vitals:  Filed Vitals:   12/06/15 0951 12/06/15 1129  BP: 181/84 134/53  Pulse: 79 79  Temp:  36.9 C  Resp:  12    Complications: No apparent anesthesia complications

## 2015-12-06 NOTE — Anesthesia Preprocedure Evaluation (Addendum)
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 on medications (-) angina(-) CAD and (-) Past MI negative cardio ROS Normal cardiovascular exam Rhythm:Regular Rate:Normal  TTE 9/13: LV EF: 50% -  55% Study Conclusions - Left ventricle: The cavity size was normal. There was mildconcentric hypertrophy. Systolic function was normal. Theestimated ejection fraction was in the range of 50% to55%. Wall motion was normal; there were no regional wallmotion abnormalities. - Left atrium: The atrium was mildly dilated.   Neuro/Psych  Headaches, PSYCHIATRIC DISORDERS Depression  Neuromuscular disease    GI/Hepatic Neg liver ROS, GERD  Medicated,  Endo/Other  diabetes, Type 2, Oral Hypoglycemic Agents, Insulin DependentObesity   Renal/GU Renal InsufficiencyRenal disease     Musculoskeletal  (+) Arthritis ,   Abdominal   Peds  Hematology  (+) Blood dyscrasia, anemia , REFUSES BLOOD PRODUCTS,   Anesthesia Other Findings Day of surgery medications reviewed with the patient.  Reproductive/Obstetrics                          Anesthesia Physical Anesthesia Plan  ASA: III  Anesthesia Plan: MAC   Post-op Pain Management:    Induction: Intravenous  Airway Management Planned: Nasal Cannula  Additional Equipment:   Intra-op Plan:   Post-operative Plan:   Informed Consent: I have reviewed the patients  History and Physical, chart, labs and discussed the procedure including the risks, benefits and alternatives for the proposed anesthesia with the patient or authorized representative who has indicated his/her understanding and acceptance.   Dental advisory given  Plan Discussed with: CRNA and Anesthesiologist  Anesthesia Plan Comments: (Discussed risks/benefits/alternatives to MAC sedation including need for ventilatory support, hypotension, need for conversion to general anesthesia.  All patient questions answered.  Patient wished to proceed.)        Anesthesia Quick Evaluation

## 2015-12-06 NOTE — Care Management Note (Addendum)
Case Management Note  Patient Details  Name: Joy Butler MRN: [id number] Date of Birth: November 01, 1951  Subjective/Objective:            Pt admitted with hypertensive emergency        Action/Plan:   Pt is from home and uses cane at home for ambulation.  Pt was previously with [facility].  PT has placed recommendations - CM requested MD to write [facility] order and place CM consult so [place] can be arranged prior to discharge.  Weekend CM will follow for disposition needs.   Expected Discharge Date:                  Expected Discharge Plan:  Home/Self Care (Indpendent from home with use of cane for asistiance with ambulation)  In-House Referral:     Discharge planning Services  CM Consult  Post Acute Care Choice:    Choice offered to:     DME Arranged:  [place] rolling DME Agency:  [place]:  PT [place]:  [place]  Status of Service: COmplete will sign off   Medicare Important Message Given:    Date Medicare IM Given:    Medicare IM give by:    Date Additional Medicare IM Given:    Additional Medicare Important Message give by:     If discussed at [place] of Stay Meetings, dates discussed:    Additional Comments: CM contacted [facility], referral accepted for [facility] Maryclare Labrador, RN 12/06/2015, 2:22 PM

## 2015-12-08 LAB — TISSUE TRANSGLUTAMINASE, IGG: TISSUE TRANSGLUT AB: 2 U/mL (ref 0–5)

## 2015-12-08 NOTE — Discharge Summary (Signed)
Name: Joy Butler MRN: [id number] DOB: [birth year] 64 y.o. PCP: [name], MD  Date of Admission: 12/03/2015  1:29 AM Date of Discharge: 12/08/2015 Attending Physician: No att. providers found  Discharge Diagnosis: 1. Symptomatic hypertension 2. Unexplained weight loss  Active Problems:   Hypertensive emergency   Unexplained weight loss  Discharge Medications:   Medication List    TAKE these medications        acetaminophen-codeine 300-30 MG tablet  Commonly known as:  TYLENOL #3  TAKE 1 TO 2 TABLETS BY MOUTH EVERY 6 HOURS AS NEEDED FOR MODERATE PAIN **MUST LAST 30 DAYS**     albuterol 108 (90 BASE) MCG/ACT inhaler  Commonly known as:  PROVENTIL HFA;VENTOLIN HFA  Inhale 2 puffs into the lungs once.     amLODipine 10 MG tablet  Commonly known as:  NORVASC  TAKE 1 TABLET BY MOUTH EVERY DAY     benzonatate 100 MG capsule  Commonly known as:  TESSALON  TAKE 1 CAPSULE BY MOUTH THREE TIMES A DAY AS NEEDED FOR COUGH     dicyclomine 20 MG tablet  Commonly known as:  BENTYL  TAKE 1 TABLET BY MOUTH FOUR TIMES A DAY     gabapentin 300 MG capsule  Commonly known as:  NEURONTIN  TAKE 1 CAPSULE BY MOUTH THREE TIMES A DAY     guaiFENesin 600 MG 12 hr tablet  Commonly known as:  MUCINEX  Take 1 tablet (600 mg total) by mouth 2 (two) times daily.     HYDROcodone-homatropine 5-1.5 MG/5ML syrup  Commonly known as:  HYCODAN  Take 5 mLs by mouth every 6 (six) hours as needed for cough.     ibuprofen 800 MG tablet  Commonly known as:  ADVIL,MOTRIN  Take 1 tablet (800 mg total) by mouth 3 (three) times daily.     insulin NPH-regular Human (70-30) 100 UNIT/ML injection  Commonly known as:  NOVOLIN 70/30  Inject 50 Units into the skin 2 (two) times daily with a meal.     loratadine 10 MG tablet  Commonly known as:  CLARITIN  Take 1 tablet (10 mg total) by mouth daily.     losartan-hydrochlorothiazide 100-25 MG tablet  Commonly known as:  HYZAAR  Take 1 tablet by  mouth daily.     metFORMIN 1000 MG tablet  Commonly known as:  GLUCOPHAGE  Take 1 tablet (1,000 mg total) by mouth 2 (two) times daily with a meal.     omeprazole 20 MG capsule  Commonly known as:  PRILOSEC  Take 1 capsule (20 mg total) by mouth 2 (two) times daily before a meal.     pravastatin 40 MG tablet  Commonly known as:  PRAVACHOL  TAKE 1 TABLET BY MOUTH EVERY EVENING     rivaroxaban 20 MG Tabs tablet  Commonly known as:  XARELTO  Take 1 tablet (20 mg total) by mouth daily with supper.     SANTYL ointment  Generic drug:  collagenase  APPLY TO AFFECTED AREA DAILY     traZODone 150 MG tablet  Commonly known as:  DESYREL  TAKE 1 TABLET BY MOUTH EVERY NIGHT AT BEDTIME        Disposition and follow-up:   Joy Butler was discharged from [facility] in Stable condition.  At the hospital follow up visit please address:  1.  Hypertension med compliance, headache, abdominal pain, weight loss  2.  Labs / imaging needed at time of follow-up: None  3.  Pending labs/ test needing follow-up: Anti-TTG, ACTH  Follow-up Appointments:     Follow-up Information    Follow up with [facility].   Why:  rolling walker   Contact information:   [place] [address] [phone]       Discharge Instructions: Discharge Instructions    Diet - low sodium heart healthy    Complete by:  As directed      Increase activity slowly    Complete by:  As directed            Consultations: Treatment Team:  Wilford Corner, MD  Procedures Performed:  Ct Head Wo Contrast  12/03/2015  CLINICAL DATA:  Headache and hypertension. EXAM: CT HEAD WITHOUT CONTRAST TECHNIQUE: Contiguous axial images were obtained from the base of the skull through the vertex without intravenous contrast. COMPARISON:  08/17/2012 FINDINGS: Ventricles and sulci appear symmetrical. No ventricular dilatation. Mild periventricular low-attenuation  change likely representing small vessel ischemia. Basal ganglia calcifications. No mass effect or midline shift. No abnormal extra-axial fluid collections. Gray-white matter junctions are distinct. Basal cisterns are not effaced. No evidence of acute intracranial hemorrhage. No depressed skull fractures. Visualized paranasal sinuses and mastoid air cells are not opacified. Vascular calcifications. IMPRESSION: No acute intracranial abnormalities. Low-attenuation changes in the deep white matter likely small vessel ischemia. Electronically Signed   By: Lucienne Capers M.D.   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 EXAM: MRI HEAD WITHOUT CONTRAST TECHNIQUE: Multiplanar, multiecho pulse sequences of the brain and surrounding structures were obtained without intravenous contrast. COMPARISON:  Head CT 12/03/2015 FINDINGS: IV contrast was not administered due to diminished renal function (GFR of 30 and creatinine of 1.95, increased from 2 days ago at 1.78). The examination had to be discontinued prior to completion due to patient claustrophobia. Sagittal T1 and axial diffusion, FLAIR, and T2 whole brain images were obtained. Small field-of-view sagittal and coronal T1 weighted images were obtained through the sella turcica. There is no evidence of acute infarct, intracranial hemorrhage, mass, midline shift, or extra-axial fluid collection. Mild generalized cerebral atrophy is within normal limits for age. Patchy to confluent T2 hyperintensities in the periventricular cerebral white matter and pons are nonspecific but compatible with moderate chronic small vessel ischemic disease. 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 There are also small, chronic right cerebellar infarcts. Prior right cataract extraction is noted. There is a small left maxillary sinus mucous retention cyst. Mastoid air cells are clear. Major intracranial  vascular flow voids are preserved. Dedicated noncontrast images through the sella turcica demonstrate a normal size and configuration of the pituitary gland. A posterior pituitary bright spot is not identified. The infundibulum is midline and normal in size. Optic chiasm is unremarkable. IMPRESSION: 1. Incomplete examination as above. 2. No acute intracranial abnormality. 3. Moderate chronic small vessel ischemic disease. 4. Negative noncontrast appearance of the pituitary gland aside from lack of visualization of the posterior pituitary bright spot. This is often not visualized in normal patients, although it can also indicate disruption of the infundibuloneurohypophyseal system. Electronically Signed   By: Logan Bores M.D.   On: 12/06/2015 13:36   Ct Abdomen Pelvis W Contrast  12/04/2015  CLINICAL DATA:  Unexplained weight loss. EXAM: CT ABDOMEN AND PELVIS WITH CONTRAST TECHNIQUE: Multidetector CT imaging of the abdomen and pelvis was performed using the standard protocol following bolus administration of intravenous contrast.  CONTRAST:  78mL OMNIPAQUE IOHEXOL 300 MG/ML  SOLN COMPARISON:  CT abdomen and pelvis 09/02/2012.  Chest CT 09/16/2014. FINDINGS: Mosaic lung attenuation is partially visualized in the lung bases as described on prior chest CT. There is no pleural effusion. The gallbladder is absent. Areas of splenic scarring are noted resulting in a nodular contour and reflecting evolution of previous infarcts which were acute on the prior abdominal CT. The liver, adrenal glands, and pancreas are unremarkable. Subcentimeter hypodensities in the kidneys are similar to the prior CT and likely represent cysts. Calcifications in both renal hila largely appear vascular. There is no hydronephrosis. Oral contrast is present in nondilated loops of small and large bowel to the level of the transverse colon without evidence of obstruction. The appendix is unremarkable. Diffuse atherosclerotic calcification is  noted of the abdominal aorta and its major branch vessels. A 10 mm short axis right external iliac lymph node is stable to minimally larger than on the prior study. A 10 mm left external iliac lymph node has mildly enlarged and is upper limits of normal in size. No enlarged lymph nodes are identified elsewhere. There is no free fluid. Bladder is unremarkable. Prior hysterectomy. No pelvic mass is seen. Subcutaneous edema is noted, greatest in the flanks. Thoracolumbar spondylosis is noted. There is advanced right-sided facet arthrosis in the lower lumbar spine. IMPRESSION: 1. No acute abnormality or mass identified in the abdomen or pelvis. 2. 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 Electronically Signed   By: Logan Bores M.D.   On: 12/04/2015 06:52   Admission HPI: This is a 64 yo woman with PMH noted in [place], who is brought in because of worsening headache and elevated blood pressure and low cbg  She says that yesterday evening she noticed she was shaking a lot so she checked her cbg and it was 63 at 9 PM. She says that she had decreased appetite for last month or so and she did not eat dinner yesterday. However, she took 45 units of 70/30 both times yesterday. She normally checks her cbgs three times a day and they run in the 100-130s. She says that she lost about 20 pounds over the last months. She also says she is having diarrhea every time she eats which is non-bloody.   She says that she is on losartaan-hctz at home, but has not taken it int he last 4-5 days because of stress in the family. She says she has enough meds at home but just forgot to take them. She noticed she had a headache since yesterday which she describes it as diffuse, and throbbing and says that she has increase in her blurry vision. She denies n/v/f/c/sob. She does not check blood pressures at home.  She isalso on home xarelto but she does not recall if she has been taking it, or any of her other home meds except the insulin.     In the ER, she was found to be hypertensive at 200s/100s and low blood sugar. EKG was nonischemic and she continued to have headache and was visibly shaking. CT head was unremarkable. CBG was repeated and it was 70, and then an amp of d50 was given. Her troponin was 0.26 and she was found to be hypokalemic which was repleted. She was placed on nicardipine drip.    Hospital Course by problem list: Active Problems:   Hypertensive emergency   Unexplained weight loss   1. Symptomatic hypertension - forgot to take meds 2/2 stress in the family,  presented with BPs in 200s/100s with headache, AKI, and slighty elevated troponin at 0.26 that decreased with serial troponins. CT head was negative for hemorrhage. She was initially on a nicardipine drip and then subsequently switched over to her home oral medications. Her headache improved and was well-controlled with Tylenol #3, which she was sent home with.  2. Chronic lower abdominal pain with 50lb unintentional weight loss x 1 year - also had persistent abdominal pain that has been evaluation in our outpatient clinic, associated with 50lb unintentional weight loss for at least 1 year. Initially thought to be IBS, but alternative diagnoses have not yet been ruled out. GI was consulted and did an EGD and colonoscopy which showed chronic gastritis and a few colon polyps which were removed, but no clear cause of her symptoms. Adrenal insufficiency was also considered, an AM cortisol was initially 2.9 but drawn very early, 3:30am. A cosyntropin stim test showed appropriate rise in cortisol, however. ACTH was pending. An MRI of the brain was negative for apparent abnormalities explaining her symptoms, including hypothalamic-pituitary axis lesions. A TTG to e/f Celiac disease was also pending at discharge.  Discharge Vitals:   BP 163/76 mmHg  Pulse 78  Temp(Src) 98 F (36.7 C) (Oral)  Resp 16  Ht 5\' 9"  (1.753 m)  Wt 227 lb (102.967 kg)  BMI 33.51 kg/m2   SpO2 96%  Discharge Labs:  No results found for this or any previous visit (from the past 24 hour(s)).  Signed: Norval Gable, MD 12/08/2015, 3:04 PM    Services Ordered on Discharge: Home health Equipment Ordered on Discharge: Gilford Rile

## 2015-12-09 ENCOUNTER — Encounter (HOSPITAL_COMMUNITY): Payer: Self-pay | Admitting: Gastroenterology

## 2015-12-11 ENCOUNTER — Telehealth: Payer: Self-pay

## 2015-12-11 ENCOUNTER — Other Ambulatory Visit: Payer: Self-pay | Admitting: Internal Medicine

## 2015-12-11 ENCOUNTER — Telehealth: Payer: Self-pay | Admitting: Internal Medicine

## 2015-12-11 NOTE — Telephone Encounter (Signed)
Transition Care Management Follow-up Telephone Call   Date discharged? 12/16   How have you been since you were released from the hospital? Still hurting, in back and stomach.  Has been able to take her medications.  [place] agency coming out today.    Do you understand why you were in the hospital? yes   Do you understand the discharge instructions? yes   Where were you discharged to? home   Items Reviewed:  Medications reviewed: yes  Allergies reviewed: yes  Dietary changes reviewed: yes  Referrals reviewed: yes   Functional Questionnaire:   Activities of Daily Living (ADLs):   She states they are independent in the following: ambulation, bathing and hygiene, feeding, continence, grooming, toileting and dressing States they require assistance with the following: na   Any transportation issues/concerns?: yes, appointment rescheduled for later in the day due to transportation limitations.    Any patient concerns? no   Confirmed importance and date/time of follow-up visits scheduled yes  Provider Appointment booked with Johnnette Gourd 12/12/15 3:15pm  Confirmed with patient if condition begins to worsen call PCP or go to the ER.  Patient was given the office number and encouraged to call back with question or concerns.  : yes

## 2015-12-11 NOTE — Telephone Encounter (Signed)
Rec call from [facility] and [facility] requesting an order for Home Health Nurse and a Education officer, museum.  She can be reached @ [phone].

## 2015-12-11 NOTE — Telephone Encounter (Signed)
rtc to cathy, lm

## 2015-12-11 NOTE — Telephone Encounter (Signed)
Rec'd call from [place] regarding orders for skilled nursing and social work.  Patient with wound to plantar surface of great toe, she has santyl in the home but nurse reports it has not been used and wound is open.  They would like recomendations after she is seen tomorrow or the [facility] RN can make recommendations for approval.  Tye [place] mentioned that pt has an abundance of active bed bugs in home and other financial issues and would benefit from their social worker being included in home care services, in addition to our [place].   VO for [facility] RN services and social work OK?

## 2015-12-11 NOTE — Telephone Encounter (Signed)
Pt will need appt in the clinic Thanks

## 2015-12-12 ENCOUNTER — Encounter: Payer: Self-pay | Admitting: Internal Medicine

## 2015-12-12 ENCOUNTER — Ambulatory Visit: Payer: BLUE CROSS/BLUE SHIELD | Admitting: Internal Medicine

## 2015-12-12 ENCOUNTER — Encounter: Payer: BLUE CROSS/BLUE SHIELD | Admitting: Internal Medicine

## 2015-12-12 NOTE — Telephone Encounter (Signed)
Patient No-showed to appointment today

## 2015-12-12 NOTE — Telephone Encounter (Signed)
Rx called in to pharmacy. Hilda Blades Kolsen Choe RN 12/12/15 8:30AM

## 2015-12-12 NOTE — Telephone Encounter (Signed)
She will be seen by Dr. Heber Oxly for her hospital follow-up visit this afternoon.  Nurse will be calling back to get the home health orders.

## 2015-12-17 ENCOUNTER — Other Ambulatory Visit: Payer: Self-pay | Admitting: Internal Medicine

## 2015-12-24 ENCOUNTER — Telehealth: Payer: Self-pay | Admitting: Internal Medicine

## 2015-12-24 NOTE — Telephone Encounter (Signed)
Talked with Joy Butler with [facility] - needs referral to see Dr Sharol Given for two wounds on same foot and needs nails trimmed. Pt states Dr Sharol Given does both. Hilda Blades Jackie Russman RN 12/24/15 4PM

## 2015-12-24 NOTE — Telephone Encounter (Signed)
Tiffany from [facility] requesting the nurse to call back.

## 2015-12-25 ENCOUNTER — Ambulatory Visit: Payer: BLUE CROSS/BLUE SHIELD | Admitting: Pulmonary Disease

## 2015-12-26 NOTE — Telephone Encounter (Signed)
Called pt and she states she cannot find transportation Will speak with csw in am

## 2015-12-26 NOTE — Telephone Encounter (Signed)
Joy Butler with [place] aware messages were left on ID phone recording [phone] for pt  to call clinic  at 9:15AM and again 10:56AM on 12/26/15.

## 2015-12-26 NOTE — Telephone Encounter (Signed)
[place] [phone] calls to say pt did not come to appt in clinic yesterday and she is worried plus [facility] needs wound care orders.  [place] wound care nurse has had 1 visit and thinks pt might possibly have osteomyelitis but needs md evaluation. Pt told [facility] hhn that she had rescheduled but pt has not. 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 Stressed importance of care to wounds and need to see md, made appt for 1/6 130 dr taylor Pt has missed appr 16 appts since April 2016 Pt has lost appr 50 lbs in past year without trying per past notes

## 2015-12-26 NOTE — Telephone Encounter (Signed)
Another message sent 12/26/15 - called pt about an appt in clinic this week. [facility] states problems with transportation.

## 2015-12-27 ENCOUNTER — Encounter: Payer: BLUE CROSS/BLUE SHIELD | Admitting: Internal Medicine

## 2015-12-27 NOTE — Telephone Encounter (Signed)
Spoke w/ sharonp. About possibly using a cab service to get pt in to clinic today, called pt and ask her if we could get her to clinic if she would be willing to be admitted to hospital for care of wounds. She states no, she has her grandchildren until maybe Sunday depending on the weather and next week would be when she could agree to be admitted to hospital. Will set appt for wed 1/11 and will set up transportation. Now that we are moving forward with transportation and know that pt will not be in until wed 1/11, we need to address how to care for wounds until office visit, HH suggest iodoform packing and foam drsg? How often til wed? Please advise Sending to dr Lynnae January, saraiya and Randell Patient

## 2015-12-27 NOTE — Telephone Encounter (Signed)
Pt indicates transportation is a barrier to medical appointments.  Pt's insurance is [employer], CSW checked 2017 benefits for this plan.  Plan does not offer non-emergency transportation as a benefit.  Ms. Gilster does not have Medicaid transportation benefit.  Pt has a completed Part B [employer] application scanned in chart from 04/2013.  CSW placed call to [employer] eligibility office to inquire if pt's was still eligible for [employer] services.  Awaiting return call.  CSW will explore if pt is in need of funding for [employer] transportation.

## 2015-12-27 NOTE — Telephone Encounter (Signed)
Discussed with Bonnita Nasuti.

## 2015-12-27 NOTE — Telephone Encounter (Signed)
Per verbal from dr Daryll Drown, [facility] may do suggested wound care of iodoform and foam drsg until appt wed 1/11, called tiffany [facility], she repeated back and will encourage pt to keep wed appt, will touch base w/ tiffany mon and pt

## 2015-12-30 ENCOUNTER — Telehealth: Payer: Self-pay | Admitting: Licensed Clinical Social Worker

## 2015-12-30 NOTE — Telephone Encounter (Signed)
CSW placed call to Ms. Rapalo to follow up on transportation options for pt's 01/01/16 appointment.  CSW inquired if pt was still active with [employer].  Pt states she is current but does not ride [employer] because "I don't do stairs".  CSW inquired what is the best mode of transportation for pt.  Ms. Huntsberry states in the past she received cab transport.  Pt unaware of the agency or who incurred the cost of this transportation.  Ms. Rodes states she has "people" that can assist her down her stairs to a cab and continues to clarify that she does not ride the [place] because of a previous incident.  Pt became irritated with this worker and raised voice stating "I've told you guys before and I'm not going to say it again.  I fell on [employer] and I'm not riding them."  Pt notified CSW will explore options and return call to patient.  CSW placed call to Ms. Sult to provide additional resources for medical transportation.  Female answered phone and states pt is sleeping at this time and offered to take message.  However, this person unable to find a paper and pen but states he can remember.  CSW informed this person of the following options that provide free and potential discounted medical transportation: Armed forces technical officer with [employer] and Insurance risk surveyor.  In addition to the above, [facility] cost would be $9/one way.  CSW will sign off and send Ms. Ayon this information in the mail.

## 2015-12-30 NOTE — Telephone Encounter (Signed)
[place]: [phone], available to provide primary care services in-home.  Cost billed available under Medicare except for $95/trip fee.

## 2015-12-30 NOTE — Telephone Encounter (Signed)
Discussion with Medical Director and [facility] Director, [place] will contact Assunta Gambles to arrange a one time cab voucher for transport to (and from if needed) pt's [facility] appointment on 01/01/16.

## 2015-12-31 ENCOUNTER — Telehealth: Payer: Self-pay | Admitting: Licensed Clinical Social Worker

## 2015-12-31 NOTE — Telephone Encounter (Signed)
CSW placed call to [facility] [employer] to arrange Joy Butler's transportation to her scheduled [facility] appointment on 01/01/16 at 10:15.  CSW utilized [facility] taxi voucher and faxed voucher to [facility], pick up confirmed at 0930 on 01/01/16.  CSW placed call to [name].  Pt notified transportation has been arranged through [facility] for pick up at 0930 on 01/01/16 for her scheduled 10:15am appointment.  Pt voiced understanding.  Pt will need contact [place] for pick up and will need new voucher for transport home from scheduled appointment.

## 2015-12-31 NOTE — Telephone Encounter (Signed)
Ms. Holford placed call to CSW this morning.  Pt states she had told her [facility] RN not to come today because pt thought her [facility] appointment was today 12/31/15.  Ms. Goeken states she attempted to reach her [facility] RN to notify but has been unable.  CSW offered to provide Ms. Ferraris with the contact number to [facility], pt did not have pen/paper at this time.  CSW offered to contact [facility] on behalf of patient.  Pt requesting CSW to request [facility] RN to come today and bring dressing changes for her.  CSW placed call to [facility], information relayed.  [facility] states the will reach out to patient.

## 2015-12-31 NOTE — Telephone Encounter (Signed)
Pt will come to appt wed via cab per shanag.

## 2016-01-01 ENCOUNTER — Encounter: Payer: Self-pay | Admitting: Pulmonary Disease

## 2016-01-01 ENCOUNTER — Encounter: Payer: Self-pay | Admitting: *Deleted

## 2016-01-01 ENCOUNTER — Inpatient Hospital Stay (HOSPITAL_COMMUNITY): Payer: Medicare Other

## 2016-01-01 ENCOUNTER — Encounter: Payer: Self-pay | Admitting: Licensed Clinical Social Worker

## 2016-01-01 ENCOUNTER — Encounter (HOSPITAL_COMMUNITY): Payer: Self-pay | Admitting: General Practice

## 2016-01-01 ENCOUNTER — Observation Stay (HOSPITAL_COMMUNITY)
Admission: AD | Admit: 2016-01-01 | Discharge: 2016-01-03 | Disposition: A | Discharge status: Home Health | Payer: Medicare Other | Source: Ambulatory Visit | Attending: Internal Medicine | Admitting: Internal Medicine

## 2016-01-01 ENCOUNTER — Ambulatory Visit (INDEPENDENT_AMBULATORY_CARE_PROVIDER_SITE_OTHER): Payer: BLUE CROSS/BLUE SHIELD | Admitting: Pulmonary Disease

## 2016-01-01 VITALS — BP 197/105 | HR 86 | Temp 97.8°F | Ht 69.0 in | Wt 232.4 lb

## 2016-01-01 DIAGNOSIS — L97521 Non-pressure chronic ulcer of other part of left foot limited to breakdown of skin: Secondary | ICD-10-CM | POA: Insufficient documentation

## 2016-01-01 DIAGNOSIS — Z8631 Personal history of diabetic foot ulcer: Secondary | ICD-10-CM | POA: Diagnosis not present

## 2016-01-01 DIAGNOSIS — I10 Essential (primary) hypertension: Secondary | ICD-10-CM | POA: Insufficient documentation

## 2016-01-01 DIAGNOSIS — L97421 Non-pressure chronic ulcer of left heel and midfoot limited to breakdown of skin: Secondary | ICD-10-CM | POA: Insufficient documentation

## 2016-01-01 DIAGNOSIS — Z794 Long term (current) use of insulin: Secondary | ICD-10-CM

## 2016-01-01 DIAGNOSIS — E11621 Type 2 diabetes mellitus with foot ulcer: Principal | ICD-10-CM | POA: Insufficient documentation

## 2016-01-01 DIAGNOSIS — Z86711 Personal history of pulmonary embolism: Secondary | ICD-10-CM | POA: Insufficient documentation

## 2016-01-01 DIAGNOSIS — T24031A Burn of unspecified degree of right lower leg, initial encounter: Secondary | ICD-10-CM | POA: Insufficient documentation

## 2016-01-01 DIAGNOSIS — X101XXA Contact with hot food, initial encounter: Secondary | ICD-10-CM | POA: Insufficient documentation

## 2016-01-01 DIAGNOSIS — E7800 Pure hypercholesterolemia, unspecified: Secondary | ICD-10-CM | POA: Insufficient documentation

## 2016-01-01 DIAGNOSIS — Z7901 Long term (current) use of anticoagulants: Secondary | ICD-10-CM | POA: Diagnosis not present

## 2016-01-01 DIAGNOSIS — E11649 Type 2 diabetes mellitus with hypoglycemia without coma: Secondary | ICD-10-CM

## 2016-01-01 DIAGNOSIS — K580 Irritable bowel syndrome with diarrhea: Secondary | ICD-10-CM | POA: Diagnosis not present

## 2016-01-01 DIAGNOSIS — T310 Burns involving less than 10% of body surface: Secondary | ICD-10-CM | POA: Insufficient documentation

## 2016-01-01 DIAGNOSIS — E162 Hypoglycemia, unspecified: Secondary | ICD-10-CM | POA: Insufficient documentation

## 2016-01-01 DIAGNOSIS — F329 Major depressive disorder, single episode, unspecified: Secondary | ICD-10-CM | POA: Diagnosis not present

## 2016-01-01 DIAGNOSIS — Z9119 Patient's noncompliance with other medical treatment and regimen: Secondary | ICD-10-CM

## 2016-01-01 DIAGNOSIS — M1710 Unilateral primary osteoarthritis, unspecified knee: Secondary | ICD-10-CM | POA: Diagnosis not present

## 2016-01-01 DIAGNOSIS — E785 Hyperlipidemia, unspecified: Secondary | ICD-10-CM | POA: Diagnosis not present

## 2016-01-01 DIAGNOSIS — F17210 Nicotine dependence, cigarettes, uncomplicated: Secondary | ICD-10-CM | POA: Insufficient documentation

## 2016-01-01 DIAGNOSIS — E11622 Type 2 diabetes mellitus with other skin ulcer: Secondary | ICD-10-CM

## 2016-01-01 DIAGNOSIS — L97509 Non-pressure chronic ulcer of other part of unspecified foot with unspecified severity: Secondary | ICD-10-CM

## 2016-01-01 DIAGNOSIS — E1140 Type 2 diabetes mellitus with diabetic neuropathy, unspecified: Secondary | ICD-10-CM | POA: Diagnosis not present

## 2016-01-01 DIAGNOSIS — L97529 Non-pressure chronic ulcer of other part of left foot with unspecified severity: Secondary | ICD-10-CM | POA: Diagnosis present

## 2016-01-01 DIAGNOSIS — E08621 Diabetes mellitus due to underlying condition with foot ulcer: Secondary | ICD-10-CM

## 2016-01-01 DIAGNOSIS — E876 Hypokalemia: Secondary | ICD-10-CM | POA: Diagnosis not present

## 2016-01-01 DIAGNOSIS — E118 Type 2 diabetes mellitus with unspecified complications: Secondary | ICD-10-CM

## 2016-01-01 DIAGNOSIS — L97429 Non-pressure chronic ulcer of left heel and midfoot with unspecified severity: Secondary | ICD-10-CM

## 2016-01-01 LAB — CBC
HCT: 40.9 % (ref 36.0–46.0)
Hemoglobin: 13.2 g/dL (ref 12.0–15.0)
MCH: 26.3 pg (ref 26.0–34.0)
MCHC: 32.3 g/dL (ref 30.0–36.0)
MCV: 81.6 fL (ref 78.0–100.0)
PLATELETS: 390 10*3/uL (ref 150–400)
RBC: 5.01 MIL/uL (ref 3.87–5.11)
RDW: 17.1 % — AB (ref 11.5–15.5)
WBC: 8.9 10*3/uL (ref 4.0–10.5)

## 2016-01-01 LAB — COMPREHENSIVE METABOLIC PANEL
ALK PHOS: 118 U/L (ref 38–126)
ALT: 12 U/L — AB (ref 14–54)
AST: 18 U/L (ref 15–41)
Albumin: 2.1 g/dL — ABNORMAL LOW (ref 3.5–5.0)
Anion gap: 10 (ref 5–15)
BILIRUBIN TOTAL: 0.3 mg/dL (ref 0.3–1.2)
BUN: 15 mg/dL (ref 6–20)
CALCIUM: 8.5 mg/dL — AB (ref 8.9–10.3)
CO2: 22 mmol/L (ref 22–32)
CREATININE: 1.71 mg/dL — AB (ref 0.44–1.00)
Chloride: 110 mmol/L (ref 101–111)
GFR, EST AFRICAN AMERICAN: 35 mL/min — AB (ref >60)
GFR, EST NON AFRICAN AMERICAN: 30 mL/min — AB (ref >60)
Glucose, Bld: 126 mg/dL — ABNORMAL HIGH (ref 65–99)
Potassium: 3.2 mmol/L — ABNORMAL LOW (ref 3.5–5.1)
Sodium: 142 mmol/L (ref 135–145)
TOTAL PROTEIN: 6.8 g/dL (ref 6.5–8.1)

## 2016-01-01 LAB — GLUCOSE, CAPILLARY
GLUCOSE-CAPILLARY: 107 mg/dL — AB (ref 65–99)
GLUCOSE-CAPILLARY: 124 mg/dL — AB (ref 65–99)
GLUCOSE-CAPILLARY: 68 mg/dL (ref 65–99)
Glucose-Capillary: 95 mg/dL (ref 65–99)

## 2016-01-01 LAB — SEDIMENTATION RATE: Sed Rate: 78 mm/hr — ABNORMAL HIGH (ref 0–22)

## 2016-01-01 LAB — POCT GLYCOSYLATED HEMOGLOBIN (HGB A1C): HEMOGLOBIN A1C: 7.2

## 2016-01-01 LAB — C-REACTIVE PROTEIN: CRP: 1.9 mg/dL — ABNORMAL HIGH (ref <1.0)

## 2016-01-01 MED ORDER — AMLODIPINE BESYLATE 10 MG PO TABS
10.0000 mg | ORAL_TABLET | Freq: Every day | ORAL | Status: DC
  Administered 2016-01-01 – 2016-01-03 (×3): 10 mg via ORAL
  Filled 2016-01-01 (×3): qty 1

## 2016-01-01 MED ORDER — RIVAROXABAN 20 MG PO TABS
20.0000 mg | ORAL_TABLET | Freq: Every day | ORAL | Status: DC
  Administered 2016-01-01 – 2016-01-03 (×3): 20 mg via ORAL
  Filled 2016-01-01 (×3): qty 1

## 2016-01-01 MED ORDER — TRAZODONE HCL 50 MG PO TABS
150.0000 mg | ORAL_TABLET | Freq: Every day | ORAL | Status: DC
  Administered 2016-01-01 – 2016-01-02 (×2): 150 mg via ORAL
  Filled 2016-01-01 (×2): qty 1

## 2016-01-01 MED ORDER — POTASSIUM CHLORIDE CRYS ER 20 MEQ PO TBCR
40.0000 meq | EXTENDED_RELEASE_TABLET | Freq: Once | ORAL | Status: AC
  Administered 2016-01-01: 40 meq via ORAL
  Filled 2016-01-01: qty 2

## 2016-01-01 MED ORDER — INSULIN ASPART PROT & ASPART (70-30 MIX) 100 UNIT/ML ~~LOC~~ SUSP
35.0000 [IU] | Freq: Two times a day (BID) | SUBCUTANEOUS | Status: DC
Start: 2016-01-01 — End: 2016-01-02
  Administered 2016-01-01 – 2016-01-02 (×2): 35 [IU] via SUBCUTANEOUS
  Filled 2016-01-01: qty 10

## 2016-01-01 MED ORDER — LOSARTAN POTASSIUM-HCTZ 100-25 MG PO TABS: 1.0000 | ORAL_TABLET | Freq: Every day | ORAL | Status: DC

## 2016-01-01 MED ORDER — HYDROCODONE-ACETAMINOPHEN 5-325 MG PO TABS
1.0000 | ORAL_TABLET | Freq: Four times a day (QID) | ORAL | Status: DC | PRN
Start: 2016-01-01 — End: 2016-01-02
  Administered 2016-01-02: 1 via ORAL
  Filled 2016-01-01: qty 1

## 2016-01-01 MED ORDER — ENSURE ENLIVE PO LIQD: 237.0000 mL | Freq: Two times a day (BID) | ORAL | Status: DC

## 2016-01-01 MED ORDER — LOSARTAN POTASSIUM 50 MG PO TABS
100.0000 mg | ORAL_TABLET | Freq: Every day | ORAL | Status: DC
  Administered 2016-01-01 – 2016-01-03 (×3): 100 mg via ORAL
  Filled 2016-01-01 (×3): qty 2

## 2016-01-01 MED ORDER — HYDROCODONE-ACETAMINOPHEN 5-325 MG PO TABS
1.0000 | ORAL_TABLET | Freq: Two times a day (BID) | ORAL | Status: DC | PRN
  Administered 2016-01-01: 1 via ORAL
  Filled 2016-01-01: qty 1

## 2016-01-01 MED ORDER — INSULIN ASPART 100 UNIT/ML ~~LOC~~ SOLN
0.0000 [IU] | Freq: Three times a day (TID) | SUBCUTANEOUS | Status: DC
  Administered 2016-01-01: 1 [IU] via SUBCUTANEOUS
  Administered 2016-01-02: 2 [IU] via SUBCUTANEOUS
  Administered 2016-01-02: 1 [IU] via SUBCUTANEOUS
  Administered 2016-01-03: 5 [IU] via SUBCUTANEOUS
  Administered 2016-01-03: 1 [IU] via SUBCUTANEOUS

## 2016-01-01 MED ORDER — ALBUTEROL SULFATE (2.5 MG/3ML) 0.083% IN NEBU: 3.0000 mL | INHALATION_SOLUTION | Freq: Four times a day (QID) | RESPIRATORY_TRACT | Status: DC | PRN

## 2016-01-01 MED ORDER — GABAPENTIN 300 MG PO CAPS
300.0000 mg | ORAL_CAPSULE | Freq: Three times a day (TID) | ORAL | Status: DC
  Administered 2016-01-01 – 2016-01-03 (×7): 300 mg via ORAL
  Filled 2016-01-01 (×7): qty 1

## 2016-01-01 MED ORDER — HYDROCHLOROTHIAZIDE 25 MG PO TABS
25.0000 mg | ORAL_TABLET | Freq: Every day | ORAL | Status: DC
  Administered 2016-01-01 – 2016-01-03 (×3): 25 mg via ORAL
  Filled 2016-01-01 (×3): qty 1

## 2016-01-01 MED ORDER — PANTOPRAZOLE SODIUM 40 MG PO TBEC
40.0000 mg | DELAYED_RELEASE_TABLET | Freq: Every day | ORAL | Status: DC
  Administered 2016-01-01 – 2016-01-03 (×3): 40 mg via ORAL
  Filled 2016-01-01 (×3): qty 1

## 2016-01-01 MED ORDER — PRAVASTATIN SODIUM 40 MG PO TABS
40.0000 mg | ORAL_TABLET | Freq: Every evening | ORAL | Status: DC
  Administered 2016-01-01 – 2016-01-03 (×3): 40 mg via ORAL
  Filled 2016-01-01 (×3): qty 1

## 2016-01-01 MED ORDER — SODIUM CHLORIDE 0.9 % IJ SOLN
3.0000 mL | Freq: Two times a day (BID) | INTRAMUSCULAR | Status: DC
  Administered 2016-01-01 – 2016-01-02 (×3): 3 mL via INTRAVENOUS

## 2016-01-01 MED ORDER — DICYCLOMINE HCL 20 MG PO TABS
20.0000 mg | ORAL_TABLET | Freq: Four times a day (QID) | ORAL | Status: DC
  Administered 2016-01-01 – 2016-01-03 (×9): 20 mg via ORAL
  Filled 2016-01-01 (×10): qty 1

## 2016-01-01 NOTE — Assessment & Plan Note (Addendum)
Assessment: Hypoglycemia today with CBG 68. Asymptomatic. Her CBG improved to 98 with orange juice.  Plan: -Hold home insulin NPH-regular 70/30 45u BID for now. -Inpatient monitoring with titration of insulin as needed.

## 2016-01-01 NOTE — Progress Notes (Signed)
Subjective:    Patient ID: Joy Butler, female    DOB: 05/04/1951, 65 y.o.   MRN: [id number]  HPI 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   She was hospitalized 12/03/2015 to 12/06/2015 for symptomatic hypertension. Her BP improved with restart of her home medications. She had workup of her weight loss and chronic abdominal pain. CT abd/pelvis was normal, Colonoscopy normal (polyps removed demonstrated tubulovillous adenoma and no high grade dysplasia on pathology), EGD with gastritis, cosyntropin stim test was unremarkable. She had persistent headaches and vision changes--MRI of her brain that was normal. 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  She reports her headache is improved but still persistent. She did not take any of her medications today. She did not eat breakfast today. She reports compliance of her medications. She has been able to tolerate PO intake and keep her medications down. Her abdominal pain is still there but it has improved some.  She has an ulceration at the base of her great toe on the plantar surface of her left foot as well as near the heal. The ulceration at the base of her great toe has been there for about a year. Her skin is dusky surrounding the ulcer. The ulcer towards her heal is new in the past 2-3 weeks. She reports pain. Denies overt warmth or erythema. No purulent drainage. Denies fevers but reports feeling cold. She also has a wound on her right medial leg above the ankle that appeared after she spilled hot food on the area. That wound has been there for the past 2-3 weeks as well.  She has not been following up at the wound care center. She has issues with transportation and being able to get to her appointments.  Review of Systems Respiratory:  no shortness of breath Cardiovascular: no chest pain Gastrointestinal: no /vomiting, +chronic abdominal pain Genitourinary: no dysuria, no hematuria  Past Medical History  Diagnosis Date  . Hypertension   . Pulmonary embolism ([place])     2011 treated at [facility] in [place].  Was on Coumadin for over a year.  no known family history  . UTI (urinary tract infection)   . High cholesterol   . 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 Depression   . Neuropathy ([facility])     feet  . Kidney stones   . Pneumonia     "several times" (07/26/2014)  . Type II diabetes mellitus ([place]) dx'd 2000  . Daily headache   . Migraine     "last one was in the 1990's" (07/26/2014)  . Arthritis     "knees" (07/26/2014)  . Chronic back pain   . Cataract of both eyes   . Hypertensive emergency 12/03/2015    Current Outpatient Prescriptions on File Prior to Visit  Medication Sig Dispense Refill  . acetaminophen-codeine (TYLENOL #3) 300-30 MG tablet TAKE 1 TO 2 TABLETS BY MOUTH EVERY 6 HOURS AS NEEDED FOR MODERATE PAIN **MUST LAST 30 DAYS** 45 tablet 4  . albuterol (PROVENTIL HFA;VENTOLIN HFA) 108 (90 BASE) MCG/ACT inhaler Inhale 2 puffs into the lungs once. (Patient taking differently: Inhale 2 puffs into the lungs every 6 (six) hours as needed for wheezing or shortness of breath. )    . amLODipine (NORVASC) 10 MG tablet  TAKE 1 TABLET BY MOUTH EVERY DAY 30 tablet 11  . benzonatate (TESSALON) 100 MG capsule TAKE 1 CAPSULE BY MOUTH THREE TIMES A DAY AS NEEDED FOR COUGH 15 capsule 0  . dicyclomine (BENTYL) 20 MG tablet Take 1 tablet (20 mg total) by mouth 4 (four) times daily. 120 tablet 3  . gabapentin (NEURONTIN) 300 MG capsule TAKE 1 CAPSULE BY MOUTH THREE TIMES A DAY 180 capsule 2  . guaiFENesin (MUCINEX) 600 MG 12 hr tablet Take 1 tablet (600 mg total) by mouth 2 (two) times daily. (Patient taking differently: Take 600 mg by mouth 2 (two) times daily as needed for cough or to loosen phlegm. ) 14 tablet 0  .  HYDROcodone-homatropine (HYCODAN) 5-1.5 MG/5ML syrup Take 5 mLs by mouth every 6 (six) hours as needed for cough. 50 mL 0  . ibuprofen (ADVIL,MOTRIN) 800 MG tablet Take 1 tablet (800 mg total) by mouth 3 (three) times daily. (Patient taking differently: Take 800 mg by mouth every 8 (eight) hours as needed for moderate pain. ) 21 tablet 0  . insulin NPH-regular Human (NOVOLIN 70/30) (70-30) 100 UNIT/ML injection Inject 50 Units into the skin 2 (two) times daily with a meal. (Patient taking differently: Inject 45 Units into the skin 2 (two) times daily with a meal. ) 30 mL 12  . loratadine (CLARITIN) 10 MG tablet Take 1 tablet (10 mg total) by mouth daily. 30 tablet 2  . losartan-hydrochlorothiazide (HYZAAR) 100-25 MG per tablet Take 1 tablet by mouth daily. 90 tablet 2  . metFORMIN (GLUCOPHAGE) 1000 MG tablet TAKE 1 TABLET BY MOUTH TWICE DAILY WITH MEALS 60 tablet 5  . omeprazole (PRILOSEC) 20 MG capsule Take 1 capsule (20 mg total) by mouth 2 (two) times daily before a meal. 60 capsule 3  . pravastatin (PRAVACHOL) 40 MG tablet TAKE 1 TABLET BY MOUTH EVERY EVENING 90 tablet 3  . rivaroxaban (XARELTO) 20 MG TABS tablet Take 1 tablet (20 mg total) by mouth daily with supper. 30 tablet 3  . SANTYL ointment APPLY TO AFFECTED AREA DAILY 90 g 4  . traZODone (DESYREL) 150 MG tablet TAKE 1 TABLET BY MOUTH EVERY NIGHT AT BEDTIME 30 tablet 4   No current facility-administered medications on file prior to visit.    Today's Vitals   01/01/16 1001  BP: 197/105  Pulse: 86  Temp: 97.8 F (36.6 C)  TempSrc: Oral  Height: 5\' 9"  (1.753 m)  Weight: 232 lb 6.4 oz (105.416 kg)  SpO2: 100%   Objective:   Physical Exam  Constitutional: No distress.  Cardiovascular:  Pulses:      Dorsalis pedis pulses are 1+ on the right side, and 0 on the left side.       Posterior tibial pulses are 1+ on the right side, and 0 on the left side.  Left PT and DP detected on doppler.  Musculoskeletal: Normal range of motion.  She exhibits tenderness (Surrounding ulcer on plantar surface of left foot towards heal).  Skin: Skin is warm and dry. She is not diaphoretic. No erythema.  1cm ulceration at base of great toe on plantar surface of left foot with surrounding dusky skin changes, no warmth, erythema, or purulent drainage.  2cm ulceration lateral plantar surface of left foot towards heel with hard eschar overlying. No warmth, erythema or purulent drainage.  1cm ulcer on medial right lower extremity. No warmth, erythema or purulent drainage.   Assessment & Plan:  Please refer to problem based charting.

## 2016-01-01 NOTE — H&P (Signed)
Date: 01/01/2016               Patient Name:  Joy Butler MRN: [id number]  DOB: 12/09/1951 Age / Sex: 65 y.o., female   PCP: [name], MD         Medical Service: Internal Medicine Teaching Service         Attending Physician: Dr. Aldine Contes, MD    First Contact: Dr. [name] Pager: [phone]  Second Contact: Dr. Julious Oka Pager: [phone]       After Hours (After 5p/  First Contact Pager: [phone]  weekends / holidays): Second Contact Pager: [phone]   Chief Complaint: bilateral foot ulcers   History of Present Illness:   This is a 65 yo woman with HTN, history of Pulmonary embolism on xarelto, IDDM, HLD, diabetic neuropathy, migraines, IBS,  Who came in to the clinic for evaluation of nonhealing foot ulcers.  She says that she has had several ulcers on the toes and heel. Her left toe ulcer was there for a year, and then oneon the heel started 3 weeks ago when she was walking to the church and she noticed skin peeling. Then she noticed some dark red/yellow drainage from the toe ulcer. She endorses some edema of the feet which she has chronically. Also, she has neuropathy of feet for many years. She denies any trauma or stepping on any nail. She also has wound on her right medial leg above the ankle that happened after she spilled some food there for 2-3 weeks. There are several other lesions on the legs.   She says that she started having a fever of 100.0 yesterday and had some chills. She says she needs glasses as her vision is poor, no chest pain. She says once she eats, she gets diarrhea and this has been worked up in the past.   She was hospitalized 12/03/2015 to 12/06/2015 for symptomatic hypertension. Her BP improved with restart of her home medications. She was also found to have a lot of weight loss and endorsed chronic abdominal pain. CT abd/pelvis was normal, Colonoscopy normal (polyps removed demonstrated tubulovillous adenoma and no high grade dysplasia on  pathology), EGD with gastritis, cosyntropin stim test was unremarkable. She had persistent headaches and vision changes--MRI of her brain that was normal. 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  Patient has a difficult social situation and she has not been able to follow up at the wound care center. This has to do with transportation and being able to get to her appointments.   In the clinic, she was also found to have low blood sugar of 67 which improved with food.  She was admitted for further workup of the toe ulcers and for consulting wound care.   FH: hypertension and diabetes in both sides SH: she continues to smoke 7 cigarettes a day and has been doing that for >10 years No recreational drug use and no alcohol use Lives with her daughter   Meds: Current Facility-Administered Medications  Medication Dose Route Frequency Provider Last Rate Last Dose  . feeding supplement (ENSURE ENLIVE) (ENSURE ENLIVE) liquid 237 mL  237 mL Oral BID BM Aldine Contes, MD        Allergies: Allergies as of 01/01/2016 - Review Complete 01/01/2016  Allergen Reaction Noted  . Keflex [cephalexin] Itching and Swelling 08/19/2012  . Clindamycin/lincomycin Itching 07/27/2014  . Latex Itching 09/01/2012  . Sulfa antibiotics Rash 08/16/2012   Past Medical History  Diagnosis Date  .  Hypertension   . Pulmonary embolism ([place])     2011 treated at [facility] in [place].  Was on Coumadin for over a year.  no known family history  . UTI (urinary tract infection)   . High cholesterol   . 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 Depression   . Neuropathy ([facility])     feet  . Kidney stones   . Pneumonia     "several times" (07/26/2014)  . Type II diabetes mellitus ([place]) dx'd 2000  . Daily headache   . Migraine     "last one was in the 1990's" (07/26/2014)  . Arthritis     "knees" (07/26/2014)  . Chronic back pain   . Cataract of both eyes   . Hypertensive emergency 12/03/2015   Past  Surgical History  Procedure Laterality Date  . Cholecystectomy      1980  . Esophagogastroduodenoscopy  08/19/2012    Procedure: ESOPHAGOGASTRODUODENOSCOPY (EGD);  Surgeon: Winfield Cunas., MD;  Location: [facility] ENDOSCOPY;  Service: Endoscopy;  Laterality: N/A;  . Cesarean section  1982  . Carpal tunnel release Bilateral   . Tonsillectomy    . Artery biopsy Right 05/09/2014    Procedure: BIOPSY TEMPORAL ARTERY;  Surgeon: Rosetta Posner, MD;  Location: [facility];  Service: Vascular;  Laterality: Right;  . Appendectomy    . Vaginal hysterectomy    . Dilation and curettage of uterus  1982  . Cystoscopy w/ stone manipulation    . Breast biopsy Left   . Temporal artery biopsy / ligation Right 04/2014  . Eye surgery Bilateral   . Refractive surgery Bilateral   . Esophagogastroduodenoscopy (egd) with propofol N/A 12/06/2015    Procedure: ESOPHAGOGASTRODUODENOSCOPY (EGD) WITH PROPOFOL;  Surgeon: Wilford Corner, MD;  Location: [facility] ENDOSCOPY;  Service: Endoscopy;  Laterality: N/A;  . Colonoscopy with propofol N/A 12/06/2015    Procedure: COLONOSCOPY WITH PROPOFOL;  Surgeon: Wilford Corner, MD;  Location: [facility] ENDOSCOPY;  Service: Endoscopy;  Laterality: N/A;   Family History  Problem Relation Age of Onset  . Adopted: Yes  . Hypertension Mother    Social History   Social History  . Marital Status: Single    Spouse Name: N/A  . Number of Children: N/A  . Years of Education: N/A   Occupational History  . Not on file.   Social History Main Topics  . Smoking status: Current Every Day Smoker -- 0.25 packs/day for 44 years    Types: Cigarettes  . Smokeless tobacco: Never Used     Comment: 1/4  PPD or less  . Alcohol Use: No  . Drug Use: No  . Sexual Activity: Yes   Other Topics Concern  . Not on file   Social History Narrative   Previously divorced now engaged with her partner of 4 years.  Has 6 grown children with 21 grandchildren.  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  Moved to [place] in August 2013 and was transiently homeless until first part of October 2013.      Review of Systems: Pertinent items noted in HPI and remainder of comprehensive ROS otherwise negative.   Physical Exam: Blood pressure 171/89, pulse 86, temperature 98.8 F (37.1 C), temperature source Oral, resp. rate 18, height 5\' 9"  (1.753 m), weight 234 lb 4.8 oz (106.278 kg), SpO2 100 %.  General: Vital signs reviewed. Patient in no acute distress and lying comfortably in bed.  HEENT: MMM, ncat  Cardiovascular: regular rate, rhythm, no murmur appreciated  Pulmonary/Chest: Clear to auscultation bilaterally, no wheezes, rales, or rhonchi. Abdominal: Soft, non-tender, non-distended, BS + Extremities: No lower extremity edema bilaterally, pulses symmetric and intact bilaterally. Skin:  Left base of great toe- has 1 cm dried ulcer with some surrounding erythema, no warmth or drainage           Left heel has an eschar and 2 cm ulceration. No warmth or erythema          Medial right leg- has 1 cm ulcer     Lab results: Results for orders placed or performed in visit on 01/01/16 (from the past 24 hour(s))  Glucose, capillary     Status: None   Collection Time: 01/01/16 10:02 AM  Result Value Ref Range   Glucose-Capillary 68 65 - 99 mg/dL   Comment 1 Notify RN    Comment 2 Document in Chart   POC Hbg A1C     Status: None   Collection Time: 01/01/16 10:28 AM  Result Value Ref Range   Hemoglobin A1C 7.2   Glucose, capillary     Status: None   Collection Time: 01/01/16 10:30 AM  Result Value Ref Range   Glucose-Capillary 95 65 - 99 mg/dL     Imaging results:  No results found.  Other results:  Assessment & Plan by Problem: Active Problems:   * No active hospital problems. *    Feet ulcers: several ulcers on the toes and heel. Her left toe ulcer was there for a year, and then oneon the heel started 3 weeks ago when she was walking to the church and  she noticed skin peeling. Then she noticed some dark red/yellow drainage from the toe ulcer. She endorses some edema of the feet which she has chronically. Also, she has neuropathy of feet for many years. She denies any trauma or stepping on any nail. She also has wound on her right medial leg above the ankle that happened after she spilled some food there for 2-3 weeks. There are several other lesions on the legs.  CBC does not show white count. She has not been following at the wound care center. In the clinic, pulses were dopplerably but not palpable.   -wound care consult -ABIs X/ray to rule out osteomyelitis -  -possible debridement? Will consult ortho depending on the results of the xray   -ordered CBC and BMET   Hypertension: She was hospitalized 12/03/2015 to 12/06/2015 for symptomatic hypertension. Her BP improved with restart of her home medications.  BP still elevated- as she did not take her meds   -on amlodipine, losartan, hctz   -monitor BP   IBS Diarrhea predominant: was evaluated by GI during last admission. She has had a 50lb weight loss over one year, and had an albumin of 1.8 indicating malnutrition. CT abd/pelvis was normal, Colonoscopy normal, EGD with gastritis, cosyntropin stim test was mostly normal - bentyl -protonix   Hypokalemia -repleted   T2DM: A1c of 7.2 today . Had hypoglycemic episode today which improved with food. Pt says she has not had anything to eat today and yesterday and has poor dietary pattern- she continues to use insulin despite that  -insuline novolog 70/30 35 units bid -SSI-S   History fo PE- she is compliant with her xarelto  -continue xarelto   HLD: pravastatin -continue above  Sleep -trazodone 150 mg bedtime    Dispo: Disposition is deferred at this time, awaiting improvement  of current medical problems. Anticipated discharge in approximately 2 day(s).   The patient does have a current PCP [name], MD) and does need an  [facility] hospital follow-up appointment after discharge.  The patient does not have transportation limitations that hinder transportation to clinic appointments.  Signed: [name], MD 01/01/2016, 4:23 PM

## 2016-01-01 NOTE — Progress Notes (Signed)
CSW met with Ms. Kilker during her scheduled [facility] appointment.  Pt voiced concern that she was to receive a walker with wheels but has not received the item and insurance has been billed for the item.  CSW placed call to [facility].  [facility] notes indicate pt received a w/w in 2010 and a cane in 2016.  Pt would be ineligible for a w/w under her insurance until 2021, as pt used her insurance to cover cost of the cane.  This information provided by [facility] processing department.  CSW notified pt of above.  CSW will encourage pt to contact [facility] and insurance provider directly regarding discrepancy.

## 2016-01-01 NOTE — Progress Notes (Signed)
Report called to Minna Merritts on [address].  Patient is alert and oriented.  Transferred via wheelchair to [address].  Sander Nephew, RN 01/01/2016 2:05 PM

## 2016-01-01 NOTE — Assessment & Plan Note (Signed)
Assessment: Nonhealing foot ulcers. She has poor compliance and follow up. Difficult to arrange workup and follow up given her social situation.  Plan: -Admit to inpatient. Concern that she may have chronic osteomyelitis underlying the ulcer at the base of the left great toe. Would obtain imaging. Wound care consult. Consider ortho consult. -Pulses on L are dopplerable but nonpalpable. Would obtain ABIs. -Will place referral for wound care clinic now as she likely will need follow up following discharge.

## 2016-01-01 NOTE — Assessment & Plan Note (Signed)
Assessment: Hypertensive this morning. She had not taken her home meds yet. She reports she was normotensive yesterday on BP check.  Plan: -Would restart her home medications amlodipine 10mg  daily, losartan-HCTZ 100-25mg  daily.

## 2016-01-01 NOTE — Progress Notes (Signed)
Medicine attending: I personally interviewed and briefly examined this patient on the day of the patient visit and reviewed pertinent clinical, laboratory, and radiographic data  with resident physician Dr. Jacques Earthly and we discussed a management plan. Poorly compliant, IDDM, labile HTN, chronic, 3 cm,  diabetic ulcer base, inferior, left great toe. Chronic ulcer medial right calf. New, approx 1 month, shallow ulcer sole of left foot/heel. Absent pulses but no cyanotic changes left foot. DP pulse easily palpable right foot. She does not have access to wound care clinic - needs immediate evaluation and attention, ABIs, X-ray to R/O osteo, wound eval, likely need of debridement. We will arrange hospital admission today.

## 2016-01-01 NOTE — Progress Notes (Signed)
Hypoglycemic Event  CBG: 68  Treatment: orange juice  Symptoms: None  Follow-up CBG: Time: 10:30 CBG Result: 98  Possible Reasons for Event:  Has not eaten today/ Last Insulin was 4 PM yesterday  Comments/MD notified: Yes   Darothy Courtright, Trey Sailors

## 2016-01-02 ENCOUNTER — Inpatient Hospital Stay (HOSPITAL_COMMUNITY): Payer: Medicare Other

## 2016-01-02 ENCOUNTER — Telehealth: Payer: Self-pay | Admitting: Internal Medicine

## 2016-01-02 DIAGNOSIS — K580 Irritable bowel syndrome with diarrhea: Secondary | ICD-10-CM

## 2016-01-02 DIAGNOSIS — L97529 Non-pressure chronic ulcer of other part of left foot with unspecified severity: Secondary | ICD-10-CM | POA: Diagnosis not present

## 2016-01-02 DIAGNOSIS — E876 Hypokalemia: Secondary | ICD-10-CM

## 2016-01-02 DIAGNOSIS — L97319 Non-pressure chronic ulcer of right ankle with unspecified severity: Secondary | ICD-10-CM

## 2016-01-02 DIAGNOSIS — I10 Essential (primary) hypertension: Secondary | ICD-10-CM

## 2016-01-02 DIAGNOSIS — Z86711 Personal history of pulmonary embolism: Secondary | ICD-10-CM

## 2016-01-02 DIAGNOSIS — Z7901 Long term (current) use of anticoagulants: Secondary | ICD-10-CM

## 2016-01-02 DIAGNOSIS — E11621 Type 2 diabetes mellitus with foot ulcer: Secondary | ICD-10-CM

## 2016-01-02 DIAGNOSIS — E1140 Type 2 diabetes mellitus with diabetic neuropathy, unspecified: Secondary | ICD-10-CM

## 2016-01-02 DIAGNOSIS — L97429 Non-pressure chronic ulcer of left heel and midfoot with unspecified severity: Secondary | ICD-10-CM

## 2016-01-02 DIAGNOSIS — E11622 Type 2 diabetes mellitus with other skin ulcer: Secondary | ICD-10-CM

## 2016-01-02 DIAGNOSIS — Z794 Long term (current) use of insulin: Secondary | ICD-10-CM

## 2016-01-02 LAB — CBC
HEMATOCRIT: 36.9 % (ref 36.0–46.0)
HEMOGLOBIN: 11.8 g/dL — AB (ref 12.0–15.0)
MCH: 26.5 pg (ref 26.0–34.0)
MCHC: 32 g/dL (ref 30.0–36.0)
MCV: 82.7 fL (ref 78.0–100.0)
PLATELETS: 383 10*3/uL (ref 150–400)
RBC: 4.46 MIL/uL (ref 3.87–5.11)
RDW: 17.4 % — ABNORMAL HIGH (ref 11.5–15.5)
WBC: 9 10*3/uL (ref 4.0–10.5)

## 2016-01-02 LAB — BASIC METABOLIC PANEL
ANION GAP: 6 (ref 5–15)
BUN: 16 mg/dL (ref 6–20)
CHLORIDE: 115 mmol/L — AB (ref 101–111)
CO2: 22 mmol/L (ref 22–32)
Calcium: 7.8 mg/dL — ABNORMAL LOW (ref 8.9–10.3)
Creatinine, Ser: 2.13 mg/dL — ABNORMAL HIGH (ref 0.44–1.00)
GFR calc Af Amer: 27 mL/min — ABNORMAL LOW (ref >60)
GFR, EST NON AFRICAN AMERICAN: 23 mL/min — AB (ref >60)
GLUCOSE: 72 mg/dL (ref 65–99)
POTASSIUM: 3.1 mmol/L — AB (ref 3.5–5.1)
Sodium: 143 mmol/L (ref 135–145)

## 2016-01-02 LAB — GLUCOSE, CAPILLARY
GLUCOSE-CAPILLARY: 129 mg/dL — AB (ref 65–99)
GLUCOSE-CAPILLARY: 176 mg/dL — AB (ref 65–99)
Glucose-Capillary: 67 mg/dL (ref 65–99)
Glucose-Capillary: 84 mg/dL (ref 65–99)
Glucose-Capillary: 88 mg/dL (ref 65–99)

## 2016-01-02 MED ORDER — HYDROCODONE-ACETAMINOPHEN 5-325 MG PO TABS
1.0000 | ORAL_TABLET | Freq: Four times a day (QID) | ORAL | Status: DC | PRN
  Administered 2016-01-02 – 2016-01-03 (×4): 2 via ORAL
  Filled 2016-01-02 (×4): qty 2

## 2016-01-02 MED ORDER — COLLAGENASE 250 UNIT/GM EX OINT
TOPICAL_OINTMENT | Freq: Every day | CUTANEOUS | Status: DC
  Administered 2016-01-02 – 2016-01-03 (×2): via TOPICAL
  Filled 2016-01-02: qty 30

## 2016-01-02 MED ORDER — INSULIN ASPART PROT & ASPART (70-30 MIX) 100 UNIT/ML ~~LOC~~ SUSP
25.0000 [IU] | Freq: Two times a day (BID) | SUBCUTANEOUS | Status: DC
  Administered 2016-01-03 (×2): 25 [IU] via SUBCUTANEOUS
  Filled 2016-01-02: qty 10

## 2016-01-02 MED ORDER — POTASSIUM CHLORIDE CRYS ER 20 MEQ PO TBCR
60.0000 meq | EXTENDED_RELEASE_TABLET | Freq: Once | ORAL | Status: AC
  Administered 2016-01-02: 60 meq via ORAL
  Filled 2016-01-02: qty 3

## 2016-01-02 MED ORDER — ENSURE ENLIVE PO LIQD
237.0000 mL | Freq: Two times a day (BID) | ORAL | Status: DC
  Administered 2016-01-02 – 2016-01-03 (×3): 237 mL via ORAL

## 2016-01-02 NOTE — Telephone Encounter (Signed)
Kishea from [facility] requesting the nurse to call back.

## 2016-01-02 NOTE — Progress Notes (Signed)
Hypoglycemic Event  CBG: 67  Treatment: 15 GM carbohydrate snack  Symptoms: None  Follow-up CBG: [id number] CBG Result:84  Possible Reasons for Event: Inadequate meal intake  Comments/MD notified: Dr. Prudy Feeler

## 2016-01-02 NOTE — Progress Notes (Signed)
Initial Nutrition Assessment  DOCUMENTATION CODES:   Obesity unspecified  INTERVENTION:  -Ensure Enlive po BID, each supplement provides 350 kcal and 20 grams of protein  NUTRITION DIAGNOSIS:   Inadequate oral intake related to poor appetite, chronic illness as evidenced by per patient/family report.  GOAL:   Patient will meet greater than or equal to 90% of their needs  MONITOR:   PO intake, I & O's, Supplement acceptance, Labs, Skin  REASON FOR ASSESSMENT:   Malnutrition Screening Tool    ASSESSMENT:   This is a 65 yo woman with HTN, history of Pulmonary embolism on xarelto, IDDM, HLD, diabetic neuropathy, migraines, IBS, Who came in to the clinic for evaluation of nonhealing foot ulcers.   Spoke with pt at bedside. Pt reports poor appetite going on 3-4 months. Pt also endorses a 50# wt loss back in June. Per chart pt's wt has been stable x5 months, prior tot hat patient had dropped from 252#-237# an insignificant wt loss. She is an IDDM but she says her sugars are normally in the 100s when she wakes up. Reports they have been in the 60s during hospital stay and that she has forced herself to eat to try and keep them a little higher. Reports yesterday was the first time she really wanted to eat in a long time. But when she does eat she "eats what she wants."  She has a diabetic foot ulcer to the base of her left great toe and an diabetic ulcer to her right calf.  She also has diarrhea predominant IBS but did not speak of it nor complain about it. She does complain of nausea. No D/V/C or chewing/swallowing problems. Multiple MD work-ups have been done, been unable to find the source of her poor appetite, and chronic abdominal pain.  Will continue to monitor for MD workup. CBGs 67-129, K 3.1, Cl 115, Cr 2.13, EGFR 27.  Medications reviewed.  Diet Order:  Diet regular Room service appropriate?: Yes; Fluid consistency:: Thin  Skin:     Last BM:  01/01/2016  Height:    Ht Readings from Last 1 Encounters:  01/01/16 _0  (1.753 m)    Weight:   Wt Readings from Last 1 Encounters:  01/01/16 234 lb 4.8 oz (106.278 kg)    Ideal Body Weight:  65.9 kg  BMI:  Body mass index is 34.58 kg/(m^2).  Estimated Nutritional Needs:   Kcal:  1600-1900  Protein:  90-105 grams  Fluid:  >/= 1.6L  EDUCATION NEEDS:   No education needs identified at this time  Joy Butler. Joy Allbaugh, MS, RD LDN After Hours/Weekend Pager [phone]

## 2016-01-02 NOTE — Progress Notes (Signed)
Patient seen and examined. Case d/w residents in detail.  HPI: In brief, patient is a 65 y/o female with PMH of HTN, PE on xarelto, DM, HLD, diabetic neuropathy, IBS, recent admission for uncontrolled HTN who p/w non healing foot ulcers including an ulcer at the base of the great toe (present for a year), left heel ulcer (present for 3 weeks) and R medial ankle ulcer (2-3 weeks). Patient states she developed L heel ulcer 3 weeks ago while walking and she also noted some mild yellowish drainage form left toe ulcer. Drainage has since resolved. She complains of low grade fevers at home beginning yesterday assoc with chills. No CP, no sob, no palpitations, no diaphoresis, no abd pain, no n/v. She has had difficulty following up at the wound care center.  Physical Exam: Gen: AAO*3, NAD CVS: RRR, normal heart sounds Lungs: CTA b/l Abd: soft, non tender, BS + Ext: dried ulcer over base of left great toe with surrounding dry necrotic tissue , no drainage. L heel ulcer with no warmth or surrounding edema, healing medial R ankle ulcer  Assessment and Plan: Patient was admitted for work up of multiple ulcers. She has no fevers currently, no leukocytosis and no pain over ulcers. Wound care f/u noted. C/w dressings per wound care. Will need offloading shoe. X ray with no osteomyelitis. Patient with elevated ESR. Will f/u MRI and ABIs. Will f/u ortho consult. Will hold off on abx for now as no active sign of infection.

## 2016-01-02 NOTE — Consult Note (Signed)
WOC wound consult note Reason for Consult: right leg, left foot wounds Patient reports left first metatarsal head, plantar surface wound present x 1 year, followed by Dr. Sharol Given but not seen recently, she has schedule appointment with Dr. Sharol Given.  ABI's pending, able to palpate pulses in the right foot, but the left is very week and difficult to assess.  MRI pending for left foot.  New area to the right plantar surface posterior at the heel Non healing burn wound to the right medial calf, present x 3 weeks Wound type: Neuropathic foot wound left foot x 2 Burn right leg  Pressure Ulcer POA: No Measurement: Left first metatarsal head; plantar surface: 3cm x 2cm x 0.1cm, dry hyperkeratotic skin, not open, not fluctuant, black centrally, present x 1 year.  Left plantar surface posterior: 1.5cm x 1.5cm x 0.2cm; open, extremely dry, pink Right medial calf: 2cm x 1.5cm x 0.2cm; 100% yellow, hard, dry Wound bed: see above Drainage (amount, consistency, odor) none from any site Periwound:intact Dressing procedure/placement/frequency: Will add enzymatic debridement ointment to the right lower leg wound to clear the dry, necrotic tissue.  No topical care at this time for the left first met head, calloused, hyperkeratotic skin  Add hydrogel to the left posterior plantar surface wound to add moisture for moist wound healing.  Needs appropriate offloading shoe to be fit as an outpatient to offload the ulcers.  Explained importance of follow up with Dr. Sharol Given for foot wounds. With pending ABI's will not order any other topical wound care at this time until arterial flow established and MRI pending to determine presence of osteomyelitis.  May need ortho inpatient after these test obtained.   Discussed POC with patient and bedside nurse.  Re consult if needed, will not follow at this time. Thanks  Atiyana Welte Kellogg, [place] [phone])

## 2016-01-02 NOTE — Telephone Encounter (Signed)
Called [facility], got vmail, lm for rtc

## 2016-01-02 NOTE — Progress Notes (Addendum)
Subjective:  No acute events overnight Had an episode of hypoglycemia overnight, so novolog was held in the morning She says the ulcers are not painful but she has severe diabetic neuropathy    Objective: Vital signs in last 24 hours: Filed Vitals:   01/01/16 2149 01/02/16 0556 01/02/16 0914 01/02/16 1349  BP: 158/86 126/73 158/83 121/66  Pulse: 92 87 84 86  Temp: 98.9 F (37.2 C) 98.3 F (36.8 C)  98.2 F (36.8 C)  TempSrc: Oral Oral  Oral  Resp: 18 18  16   Height:      Weight:      SpO2: 88% 93% 100% 97%   Weight change:   Intake/Output Summary (Last 24 hours) at 01/02/16 1805 Last data filed at 01/02/16 1526  Gross per 24 hour  Intake    720 ml  Output   1650 ml  Net   -930 ml   Gen: A&O CV: rrr, no m/r/g Pulm: ctab, no wheezing Abd- soft, ntnd Skin: Left base of great toe- has 1 cm dried ulcer with some surrounding erythema, no warmth or drainage  Left heel has an eschar and 2 cm ulceration. No warmth or erythema   Medial right leg- has 1 cm ulcer  NO DRAINAGE   Lab Results: Results for orders placed or performed during the hospital encounter of 01/01/16 (from the past 24 hour(s))  Glucose, capillary     Status: Abnormal   Collection Time: 01/01/16 11:44 PM  Result Value Ref Range   Glucose-Capillary 107 (H) 65 - 99 mg/dL  CBC     Status: Abnormal   Collection Time: 01/02/16  5:08 AM  Result Value Ref Range   WBC 9.0 4.0 - 10.5 K/uL   RBC 4.46 3.87 - 5.11 MIL/uL   Hemoglobin 11.8 (L) 12.0 - 15.0 g/dL   HCT 36.9 36.0 - 46.0 %   MCV 82.7 78.0 - 100.0 fL   MCH 26.5 26.0 - 34.0 pg   MCHC 32.0 30.0 - 36.0 g/dL   RDW 17.4 (H) 11.5 - 15.5 %   Platelets 383 150 - 400 K/uL  Basic metabolic panel     Status: Abnormal   Collection Time: 01/02/16  5:08 AM  Result Value Ref Range   Sodium 143 135 - 145 mmol/L   Potassium 3.1 (L) 3.5 - 5.1 mmol/L   Chloride 115 (H) 101 - 111 mmol/L   CO2 22 22 - 32 mmol/L   Glucose, Bld 72 65 - 99 mg/dL     BUN 16 6 - 20 mg/dL   Creatinine, Ser 2.13 (H) 0.44 - 1.00 mg/dL   Calcium 7.8 (L) 8.9 - 10.3 mg/dL   GFR calc non Af Amer 23 (L) >60 mL/min   GFR calc Af Amer 27 (L) >60 mL/min   Anion gap 6 5 - 15  Glucose, capillary     Status: None   Collection Time: 01/02/16  7:54 AM  Result Value Ref Range   Glucose-Capillary 67 65 - 99 mg/dL  Glucose, capillary     Status: None   Collection Time: 01/02/16  8:27 AM  Result Value Ref Range   Glucose-Capillary 84 65 - 99 mg/dL  Glucose, capillary     Status: Abnormal   Collection Time: 01/02/16 11:50 AM  Result Value Ref Range   Glucose-Capillary 129 (H) 65 - 99 mg/dL  Glucose, capillary     Status: Abnormal   Collection Time: 01/02/16  5:22 PM  Result Value Ref Range  Glucose-Capillary 176 (H) 65 - 99 mg/dL    Micro Results: No results found for this or any previous visit (from the past 240 hour(s)). Studies/Results: Dg Foot Complete Left  01/01/2016  CLINICAL DATA:  Left foot ulcers. EXAM: LEFT FOOT - COMPLETE 3+ VIEW COMPARISON:  MRI of July 26, 2014. FINDINGS: There is no evidence of fracture or dislocation. There is no evidence of arthropathy or other focal bone abnormality. Vascular calcifications are noted. Ulceration is seen in the plantar soft tissues posteriorly. IMPRESSION: Plantar soft tissue ulceration is noted posteriorly. No osteomyelitis is noted. Electronically Signed   By: Marijo Conception, M.D.   On: 01/01/2016 17:43   Medications: I have reviewed the patient's current medications. Scheduled Meds: . amLODipine  10 mg Oral Daily  . collagenase   Topical Daily  . dicyclomine  20 mg Oral QID  . feeding supplement (ENSURE ENLIVE)  237 mL Oral BID BM  . gabapentin  300 mg Oral TID  . losartan  100 mg Oral Daily   And  . hydrochlorothiazide  25 mg Oral Daily  . insulin aspart  0-9 Units Subcutaneous TID WC  . insulin aspart protamine- aspart  35 Units Subcutaneous BID WC  . pantoprazole  40 mg Oral Daily  . pravastatin   40 mg Oral QPM  . rivaroxaban  20 mg Oral Q supper  . sodium chloride  3 mL Intravenous Q12H  . traZODone  150 mg Oral QHS   Continuous Infusions:  PRN Meds:.albuterol, HYDROcodone-acetaminophen Assessment/Plan: Active Problems:   Diabetic ulcer of left foot (HCC)  Feet ulcers: several ulcers on the toes and heel. Her left toe ulcer was there for a year, and then oneon the heel started 3 weeks ago when she was walking to the church and she noticed skin peeling. Then she noticed some dark red/yellow drainage from the toe ulcer. She endorses some edema of the feet which she has chronically. Also, she has neuropathy of feet for many years. She denies any trauma or stepping on any nail. She also has wound on her right medial leg above the ankle that happened after she spilled some food there for 2-3 weeks. There are several other lesions on the legs. CBC does not show white count. She has not been following at the wound care center. In the clinic, pulses were dopplerably but not palpable. Xray was unremarkable  -wound care consult and following- they recommended offloading shoes, and hydrogel.  -MRI pending -ortho consulted,  X/ray to rule out osteomyelitis -  -ordered CBC and BMET  Hypokalemia: K of 3.1 -repleted accordingly  Hypertension: She was hospitalized 12/03/2015 to 12/06/2015 for symptomatic hypertension. Her BP improved with restart of her home medications. BP still elevated- as she did not take her meds. Her blood pressure has been stable  -on amlodipine, losartan, hctz  -monitor BP   IBS Diarrhea predominant: was evaluated by GI during last admission. She has had a 50lb weight loss over one year, and had an albumin of 1.8 indicating malnutrition. CT abd/pelvis was normal, Colonoscopy normal, EGD with gastritis, cosyntropin stim test was mostly normal. She denies any diarrhea.   - bentyl -protonix   T2DM: A1c of 7.2 today . Had hypoglycemic episode today which improved  with food. 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  Anticipated discharge in approximately 2 day(s).   The patient does have a current PCP [name], MD) and does need an [facility] hospital follow-up appointment after discharge.  The patient does not have transportation limitations that hinder transportation to clinic appointments.  .Services Needed at time of discharge: Y = Yes, Blank = No PT:   OT:   RN:   Equipment:   Other:     LOS: 1 day   [name], MD 01/02/2016, 6:05 PM

## 2016-01-03 ENCOUNTER — Encounter (HOSPITAL_COMMUNITY): Payer: BLUE CROSS/BLUE SHIELD

## 2016-01-03 ENCOUNTER — Inpatient Hospital Stay (HOSPITAL_COMMUNITY): Payer: Medicare Other

## 2016-01-03 ENCOUNTER — Encounter: Payer: Self-pay | Admitting: Internal Medicine

## 2016-01-03 ENCOUNTER — Inpatient Hospital Stay (HOSPITAL_BASED_OUTPATIENT_CLINIC_OR_DEPARTMENT_OTHER): Payer: Medicare Other

## 2016-01-03 DIAGNOSIS — E11621 Type 2 diabetes mellitus with foot ulcer: Secondary | ICD-10-CM | POA: Diagnosis not present

## 2016-01-03 DIAGNOSIS — E08621 Diabetes mellitus due to underlying condition with foot ulcer: Secondary | ICD-10-CM

## 2016-01-03 DIAGNOSIS — L97429 Non-pressure chronic ulcer of left heel and midfoot with unspecified severity: Secondary | ICD-10-CM | POA: Diagnosis not present

## 2016-01-03 DIAGNOSIS — E1140 Type 2 diabetes mellitus with diabetic neuropathy, unspecified: Secondary | ICD-10-CM | POA: Diagnosis not present

## 2016-01-03 DIAGNOSIS — L97529 Non-pressure chronic ulcer of other part of left foot with unspecified severity: Secondary | ICD-10-CM | POA: Diagnosis not present

## 2016-01-03 LAB — CBC
HCT: 36.6 % (ref 36.0–46.0)
HEMOGLOBIN: 11.6 g/dL — AB (ref 12.0–15.0)
MCH: 26.2 pg (ref 26.0–34.0)
MCHC: 31.7 g/dL (ref 30.0–36.0)
MCV: 82.8 fL (ref 78.0–100.0)
PLATELETS: 342 10*3/uL (ref 150–400)
RBC: 4.42 MIL/uL (ref 3.87–5.11)
RDW: 17.8 % — ABNORMAL HIGH (ref 11.5–15.5)
WBC: 9.3 10*3/uL (ref 4.0–10.5)

## 2016-01-03 LAB — BASIC METABOLIC PANEL
ANION GAP: 9 (ref 5–15)
BUN: 19 mg/dL (ref 6–20)
CHLORIDE: 111 mmol/L (ref 101–111)
CO2: 20 mmol/L — ABNORMAL LOW (ref 22–32)
Calcium: 8.1 mg/dL — ABNORMAL LOW (ref 8.9–10.3)
Creatinine, Ser: 2.54 mg/dL — ABNORMAL HIGH (ref 0.44–1.00)
GFR calc Af Amer: 22 mL/min — ABNORMAL LOW (ref >60)
GFR calc non Af Amer: 19 mL/min — ABNORMAL LOW (ref >60)
GLUCOSE: 112 mg/dL — AB (ref 65–99)
POTASSIUM: 3.9 mmol/L (ref 3.5–5.1)
Sodium: 140 mmol/L (ref 135–145)

## 2016-01-03 LAB — GLUCOSE, CAPILLARY
GLUCOSE-CAPILLARY: 266 mg/dL — AB (ref 65–99)
GLUCOSE-CAPILLARY: 103 mg/dL — AB (ref 65–99)
Glucose-Capillary: 127 mg/dL — ABNORMAL HIGH (ref 65–99)

## 2016-01-03 LAB — PROTEIN / CREATININE RATIO, URINE
Creatinine, Urine: 101.36 mg/dL
PROTEIN CREATININE RATIO: 10 mg/mg{creat} — AB (ref 0.00–0.15)
Total Protein, Urine: 1013.6 mg/dL

## 2016-01-03 LAB — CREATININE, URINE, RANDOM: CREATININE, URINE: 99.97 mg/dL

## 2016-01-03 LAB — SODIUM, URINE, RANDOM: Sodium, Ur: 56 mmol/L

## 2016-01-03 MED ORDER — HYDROCODONE-ACETAMINOPHEN 10-325 MG PO TABS: 1.0000 | ORAL_TABLET | Freq: Three times a day (TID) | ORAL | Status: DC | PRN

## 2016-01-03 MED ORDER — GLUCERNA SHAKE PO LIQD: 237.0000 mL | Freq: Every day | ORAL | Status: DC | PRN

## 2016-01-03 MED ORDER — ONDANSETRON HCL 4 MG/2ML IJ SOLN
4.0000 mg | Freq: Once | INTRAMUSCULAR | Status: AC
  Administered 2016-01-03: 4 mg via INTRAVENOUS
  Filled 2016-01-03: qty 2

## 2016-01-03 MED ORDER — INSULIN NPH ISOPHANE & REGULAR (70-30) 100 UNIT/ML ~~LOC~~ SUSP: 25.0000 [IU] | Freq: Two times a day (BID) | SUBCUTANEOUS | Status: DC

## 2016-01-03 MED ORDER — INSULIN NPH ISOPHANE & REGULAR (70-30) 100 UNIT/ML ~~LOC~~ SUSP: 35.0000 [IU] | Freq: Two times a day (BID) | SUBCUTANEOUS | Status: DC

## 2016-01-03 NOTE — Progress Notes (Signed)
Subjective:  No acute events overnight No episodes of hypoglycemia after decreasing insulin to 25 units bid She denied f/c/n/v but she has chronic diarrhea.  She says she does not stay hydrated enough- we advised her to stay hydrated by drinking adequate water and explained her renal function   Objective: Vital signs in last 24 hours: Filed Vitals:   01/02/16 2032 01/03/16 0535 01/03/16 0546 01/03/16 1440  BP: 143/88 130/61 152/80 131/66  Pulse: 78 82 91 83  Temp: 98.4 F (36.9 C) 98.3 F (36.8 C) 98 F (36.7 C)   TempSrc: Oral Oral Oral   Resp: 18 16 16 21   Height:      Weight:      SpO2: 98% 100% 94% 95%   Weight change:   Intake/Output Summary (Last 24 hours) at 01/03/16 1447 Last data filed at 01/03/16 0646  Gross per 24 hour  Intake    690 ml  Output   1025 ml  Net   -335 ml   Gen: A&O CV: rrr, no m/r/g Pulm: ctab, no wheezing Abd- soft, ntnd Skin: Left base of great toe- has 1 cm dried ulcer with some surrounding erythema, no warmth or drainage  Left heel has an eschar and 2 cm ulceration. No warmth or erythema   Medial right leg- has 1 cm ulcer  NO DRAINAGE noted on 01/03/2016   Lab Results: Results for orders placed or performed during the hospital encounter of 01/01/16 (from the past 24 hour(s))  Glucose, capillary     Status: Abnormal   Collection Time: 01/02/16  5:22 PM  Result Value Ref Range   Glucose-Capillary 176 (H) 65 - 99 mg/dL  Glucose, capillary     Status: None   Collection Time: 01/02/16  9:57 PM  Result Value Ref Range   Glucose-Capillary 88 65 - 99 mg/dL  CBC     Status: Abnormal   Collection Time: 01/03/16  6:28 AM  Result Value Ref Range   WBC 9.3 4.0 - 10.5 K/uL   RBC 4.42 3.87 - 5.11 MIL/uL   Hemoglobin 11.6 (L) 12.0 - 15.0 g/dL   HCT 36.6 36.0 - 46.0 %   MCV 82.8 78.0 - 100.0 fL   MCH 26.2 26.0 - 34.0 pg   MCHC 31.7 30.0 - 36.0 g/dL   RDW 17.8 (H) 11.5 - 15.5 %   Platelets 342 150 - 400 K/uL  Basic  metabolic panel     Status: Abnormal   Collection Time: 01/03/16  6:28 AM  Result Value Ref Range   Sodium 140 135 - 145 mmol/L   Potassium 3.9 3.5 - 5.1 mmol/L   Chloride 111 101 - 111 mmol/L   CO2 20 (L) 22 - 32 mmol/L   Glucose, Bld 112 (H) 65 - 99 mg/dL   BUN 19 6 - 20 mg/dL   Creatinine, Ser 2.54 (H) 0.44 - 1.00 mg/dL   Calcium 8.1 (L) 8.9 - 10.3 mg/dL   GFR calc non Af Amer 19 (L) >60 mL/min   GFR calc Af Amer 22 (L) >60 mL/min   Anion gap 9 5 - 15  Glucose, capillary     Status: Abnormal   Collection Time: 01/03/16  8:05 AM  Result Value Ref Range   Glucose-Capillary 127 (H) 65 - 99 mg/dL  Glucose, capillary     Status: Abnormal   Collection Time: 01/03/16 11:56 AM  Result Value Ref Range   Glucose-Capillary 266 (H) 65 - 99 mg/dL    Micro  Results: No results found for this or any previous visit (from the past 240 hour(s)). Studies/Results: Mr Foot Left Wo Contrast  01/03/2016  CLINICAL DATA:  Diabetic patient with chronic nonhealing ulcers about the left foot. Initial encounter. EXAM: MRI OF THE LEFT FOREFOOT WITHOUT CONTRAST TECHNIQUE: Multiplanar, multisequence MR imaging was performed. No intravenous contrast was administered. COMPARISON:  Plain films left foot 01/01/2016. MRI left foot 07/26/2014. FINDINGS: There is no bone marrow signal abnormality to suggest osteomyelitis. Superficial ulceration on the plantar surface of the foot just distal to the healed is identified. A second ulceration is seen at the level the first MTP joint. No abscess is identified. Subcutaneous edema is present about the foot. Intrinsic musculature demonstrates some atrophy without focal lesion. IMPRESSION: Skin ulcerations of the foot without abscess or osteomyelitis. Subcutaneous edema in the dorsum of the foot may be due to dependent change or cellulitis. Electronically Signed   By: Inge Rise M.D.   On: 01/03/2016 07:49   Dg Foot Complete Left  01/01/2016  CLINICAL DATA:  Left foot  ulcers. EXAM: LEFT FOOT - COMPLETE 3+ VIEW COMPARISON:  MRI of July 26, 2014. FINDINGS: There is no evidence of fracture or dislocation. There is no evidence of arthropathy or other focal bone abnormality. Vascular calcifications are noted. Ulceration is seen in the plantar soft tissues posteriorly. IMPRESSION: Plantar soft tissue ulceration is noted posteriorly. No osteomyelitis is noted. Electronically Signed   By: Marijo Conception, M.D.   On: 01/01/2016 17:43   Medications: I have reviewed the patient's current medications. Scheduled Meds: . amLODipine  10 mg Oral Daily  . collagenase   Topical Daily  . dicyclomine  20 mg Oral QID  . feeding supplement (ENSURE ENLIVE)  237 mL Oral BID BM  . gabapentin  300 mg Oral TID  . losartan  100 mg Oral Daily   And  . hydrochlorothiazide  25 mg Oral Daily  . insulin aspart  0-9 Units Subcutaneous TID WC  . insulin aspart protamine- aspart  25 Units Subcutaneous BID WC  . pantoprazole  40 mg Oral Daily  . pravastatin  40 mg Oral QPM  . rivaroxaban  20 mg Oral Q supper  . sodium chloride  3 mL Intravenous Q12H  . traZODone  150 mg Oral QHS   Continuous Infusions:  PRN Meds:.albuterol, HYDROcodone-acetaminophen Assessment/Plan: Active Problems:   Diabetic ulcer of left foot (HCC)  Feet ulcers: several ulcers on the toes and heel. Her left toe ulcer was there for a year, and then oneon the heel started 3 weeks ago when she was walking to the church and she noticed skin peeling. Then she noticed some dark red/yellow drainage from the toe ulcer. She endorses some edema of the feet which she has chronically. Also, she has neuropathy of feet for many years. She denies any trauma or stepping on any nail. She also has wound on her right medial leg above the ankle that happened after she spilled some food there for 2-3 weeks. There are several other lesions on the legs. CBC does not show white count. She has not been following at the wound care center. In  the clinic, pulses were dopplerably but not palpable. Xray was unremarkable. MRi of the feet ruled out osteomyelitis or cellulitis.  Dr. Sharol Given was consulted and had no surgical recommendations- she will follow up in his office in 2 weeks, an appointment was coordinated.  ABIs were done and they were unremarkable.   -Talked to wound  care again, Dr duda recommended power chair instead of off-loading shoes- pt does not want power chair, so PT re-evaluated her and pt will provide wheelchair.  -social work consulted again and explained importance of not missing any of the outpatient appointments   -pt has enough pain medicines for the next 5 months   Hypokalemia: K of 3.1 -repleted accordingly - her potassium normal today  Elevated creatinine: Pt creatinine steadily rising,baseline around 1.27 5 months ago, today it was 2.57  -repeat BMET next week -advised to stay properly hydrated -ordered FeNa, and urine protein creatinine ratio   Hypertension: She was hospitalized 12/03/2015 to 12/06/2015 for symptomatic hypertension. Her BP improved with restart of her home medications. BP still elevated- as she did not take her meds. Her blood pressure has been stable  -on amlodipine, losartan, hctz    T2DM: A1c of 7.2 . No episodes of hypoglycemia after insulin decreased to 25 units bid.  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  Anticipated discharge in approximately 0 day(s).   The patient does have a current PCP [name], MD) and does need an [facility] hospital follow-up appointment after discharge.  The patient does not have transportation limitations that hinder transportation to clinic appointments.  .Services Needed at time of discharge: Y = Yes, Blank =  No PT:   OT:   RN:   Equipment:   Other:     LOS: 2 days   [name], MD 01/03/2016, 2:47 PM

## 2016-01-03 NOTE — Progress Notes (Signed)
CSW consulted regarding transportation needs. Patient reported not having transportation to her medical appointments. CSW provided resources for patients over 60 to have access to transportation for medical and non-medical reasons.  Joy Butler LCSWA [phone]

## 2016-01-03 NOTE — Discharge Summary (Signed)
Name: Joy Butler MRN: [id number] DOB: [birth year] 65 y.o. PCP: [name], MD  Date of Admission: 01/01/2016  2:43 PM Date of Discharge: 01/03/2016 Attending Physician: Aldine Contes, MD  Discharge Diagnosis: 1. [place] ulcers on feet, rule out osteomyelitis Active Problems:   Diabetic ulcer of left foot (HCC)  Discharge Medications:   Medication List    STOP taking these medications        benzonatate 100 MG capsule  Commonly known as:  TESSALON     ibuprofen 800 MG tablet  Commonly known as:  ADVIL,MOTRIN     metFORMIN 1000 MG tablet  Commonly known as:  GLUCOPHAGE      TAKE these medications        acetaminophen-codeine 300-30 MG tablet  Commonly known as:  TYLENOL #3  TAKE 1 TO 2 TABLETS BY MOUTH EVERY 6 HOURS AS NEEDED FOR MODERATE PAIN **MUST LAST 30 DAYS**     albuterol 108 (90 Base) MCG/ACT inhaler  Commonly known as:  PROVENTIL HFA;VENTOLIN HFA  Inhale 2 puffs into the lungs once.     amLODipine 10 MG tablet  Commonly known as:  NORVASC  TAKE 1 TABLET BY MOUTH EVERY DAY     dicyclomine 20 MG tablet  Commonly known as:  BENTYL  Take 1 tablet (20 mg total) by mouth 4 (four) times daily.     feeding supplement (GLUCERNA SHAKE) Liqd  Take 237 mLs by mouth daily as needed.     gabapentin 300 MG capsule  Commonly known as:  NEURONTIN  TAKE 1 CAPSULE BY MOUTH THREE TIMES A DAY     guaiFENesin 600 MG 12 hr tablet  Commonly known as:  MUCINEX  Take 1 tablet (600 mg total) by mouth 2 (two) times daily.     HYDROcodone-homatropine 5-1.5 MG/5ML syrup  Commonly known as:  HYCODAN  Take 5 mLs by mouth every 6 (six) hours as needed for cough.     insulin NPH-regular Human (70-30) 100 UNIT/ML injection  Commonly known as:  NOVOLIN 70/30  Inject 25 Units into the skin 2 (two) times daily with a meal.     loratadine 10 MG tablet  Commonly known as:  CLARITIN  Take 1 tablet (10 mg total) by mouth daily.     losartan-hydrochlorothiazide 100-25 MG  tablet  Commonly known as:  HYZAAR  Take 1 tablet by mouth daily.     omeprazole 20 MG capsule  Commonly known as:  PRILOSEC  Take 1 capsule (20 mg total) by mouth 2 (two) times daily before a meal.     pravastatin 40 MG tablet  Commonly known as:  PRAVACHOL  TAKE 1 TABLET BY MOUTH EVERY EVENING     rivaroxaban 20 MG Tabs tablet  Commonly known as:  XARELTO  Take 1 tablet (20 mg total) by mouth daily with supper.     SANTYL ointment  Generic drug:  collagenase  APPLY TO AFFECTED AREA DAILY     traZODone 150 MG tablet  Commonly known as:  DESYREL  TAKE 1 TABLET BY MOUTH EVERY NIGHT AT BEDTIME        Disposition and follow-up:   Ms.Shanisha R Nearing was discharged from [facility] in Stable condition.  At the hospital follow up visit please address:  1.  Feet ulcers: has pt gone to her orthopaedics appt Elevated creatinine- please repeat BMET. Her Cr on discharge was 2.57.  T2DM: Metformin was HELD on discharge and insulin 70/30 decreased to 25  units bid- please re-assess her log and adjust insulin  Accordingly  HYpokalemia- K was stable on discharge but was low on prior days and was repleted. HTN: please re-assess BP and adjust meds as needed  2.  Labs / imaging needed at time of follow-up: BMET , cbg  3.  Pending labs/ test needing follow-up: FeNa, urine protein creatinine spot ratio   Follow-up Appointments:     Follow-up Information    Follow up with Duwaine Maxin, DO. Go on 01/09/2016.   Specialty:  Internal Medicine   Why:  9:45 AM   Contact information:   [place] [id number] [phone]       Follow up with Newt Minion, MD On 01/22/2016.   Specialty:  Orthopedic Surgery   Why:  1:15 PM   Contact information:   [place] [address] [phone]       Follow up with [facility].   Why:  Resume HHRN for wound care, PT, and Social Radio producer information:   [address] [place] [address] [phone]       Follow up with Home.   Why:  w/chair will be brought up to patients room    Contact information:   [address] [place]  [address] [phone]       Discharge Instructions: Discharge Instructions    Diet - low sodium heart healthy    Complete by:  As directed      Diet - low sodium heart healthy    Complete by:  As directed      Discharge instructions    Complete by:  As directed   Please continue with the wound care Please follow up with Dr Sharol Given- we made you an appointment  Please follow up next week in our clinic  Please HOLD the metformin until you see [place] in clinic next week Please take 35 units of novolog 2 times a day instead of 50 units- your insulin will be adjusted at the next clinic visit   We also put the order for a wheelchair- please let us know if any problems.   It was a pleasure taking care of you.     Discharge instructions    Complete by:  As directed   Please continue with the wound care Please follow up with Dr Sharol Given- we made you an appointment  Please follow up next week in our clinic  Please HOLD the metformin until you see [place] in clinic next week Please take 25 units of novolog 2 times a day instead of 50 units- your insulin will be adjusted at the next clinic visit  Please DO NOT take ibuprofen- as your kidney function has slowed down a little bit- we will repeat it next visit   We also put the order for a wheelchair- please let us know if any problems.   It was a pleasure taking care of you.     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 EXAM: MRI HEAD WITHOUT CONTRAST TECHNIQUE: Multiplanar, multiecho pulse sequences of the brain and surrounding  structures were obtained without intravenous  contrast. COMPARISON:  Head CT 12/03/2015 FINDINGS: IV contrast was not administered due to diminished renal function (GFR of 30 and creatinine of 1.95, increased from 2 days ago at 1.78). The examination had to be discontinued prior to completion due to patient claustrophobia. Sagittal T1 and axial diffusion, FLAIR, and T2 whole brain images were obtained. Small field-of-view sagittal and coronal T1 weighted images were obtained through the sella turcica. There is no evidence of acute infarct, intracranial hemorrhage, mass, midline shift, or extra-axial fluid collection. Mild generalized cerebral atrophy is within normal limits for age. Patchy to confluent T2 hyperintensities in the periventricular cerebral white matter and pons are nonspecific but compatible with moderate chronic small vessel ischemic disease. 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 There are also small, chronic right cerebellar infarcts. Prior right cataract extraction is noted. There is a small left maxillary sinus mucous retention cyst. Mastoid air cells are clear. Major intracranial vascular flow voids are preserved. Dedicated noncontrast images through the sella turcica demonstrate a normal size and configuration of the pituitary gland. A posterior pituitary bright spot is not identified. The infundibulum is midline and normal in size. Optic chiasm is unremarkable. IMPRESSION: 1. Incomplete examination as above. 2. No acute intracranial abnormality. 3. Moderate chronic small vessel ischemic disease. 4. Negative noncontrast appearance of the pituitary gland aside from lack of visualization of the posterior pituitary bright spot. This is often not visualized in normal patients, although it can also indicate disruption of the infundibuloneurohypophyseal system. Electronically Signed   By: Logan Bores M.D.   On: 12/06/2015 13:36   Mr Foot Left Wo Contrast  01/03/2016  CLINICAL  DATA:  Diabetic patient with chronic nonhealing ulcers about the left foot. Initial encounter. EXAM: MRI OF THE LEFT FOREFOOT WITHOUT CONTRAST TECHNIQUE: Multiplanar, multisequence MR imaging was performed. No intravenous contrast was administered. COMPARISON:  Plain films left foot 01/01/2016. MRI left foot 07/26/2014. FINDINGS: There is no bone marrow signal abnormality to suggest osteomyelitis. Superficial ulceration on the plantar surface of the foot just distal to the healed is identified. A second ulceration is seen at the level the first MTP joint. No abscess is identified. Subcutaneous edema is present about the foot. Intrinsic musculature demonstrates some atrophy without focal lesion. IMPRESSION: Skin ulcerations of the foot without abscess or osteomyelitis. Subcutaneous edema in the dorsum of the foot may be due to dependent change or cellulitis. Electronically Signed   By: Inge Rise M.D.   On: 01/03/2016 07:49   Dg Foot Complete Left  01/01/2016  CLINICAL DATA:  Left foot ulcers. EXAM: LEFT FOOT - COMPLETE 3+ VIEW COMPARISON:  MRI of July 26, 2014. FINDINGS: There is no evidence of fracture or dislocation. There is no evidence of arthropathy or other focal bone abnormality. Vascular calcifications are noted. Ulceration is seen in the plantar soft tissues posteriorly. IMPRESSION: Plantar soft tissue ulceration is noted posteriorly. No osteomyelitis is noted. Electronically Signed   By: Marijo Conception, M.D.   On: 01/01/2016 17:43    2D Echo:   Cardiac Cath:   Admission HPI:   This is a 66 yo woman with HTN, history of Pulmonary embolism on xarelto, IDDM, HLD, diabetic neuropathy, migraines, IBS, Who came in to the clinic for evaluation of nonhealing foot ulcers.  She says that she has had several ulcers on the toes and heel. Her left toe ulcer was there for a year, and then oneon the heel started 3 weeks ago when she was walking to  the church and she noticed skin peeling. Then she  noticed some dark red/yellow drainage from the toe ulcer. She endorses some edema of the feet which she has chronically. Also, she has neuropathy of feet for many years. She denies any trauma or stepping on any nail. She also has wound on her right medial leg above the ankle that happened after she spilled some food there for 2-3 weeks. There are several other lesions on the legs.   She says that she started having a fever of 100.0 yesterday and had some chills. She says she needs glasses as her vision is poor, no chest pain. She says once she eats, she gets diarrhea and this has been worked up in the past.   She was hospitalized 12/03/2015 to 12/06/2015 for symptomatic hypertension. Her BP improved with restart of her home medications. She was also found to have a lot of weight loss and endorsed chronic abdominal pain. CT abd/pelvis was normal, Colonoscopy normal (polyps removed demonstrated tubulovillous adenoma and no high grade dysplasia on pathology), EGD with gastritis, cosyntropin stim test was unremarkable. She had persistent headaches and vision changes--MRI of her brain that was normal. 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  Patient has a difficult social situation and she has not been able to follow up at the wound care center. This has to do with transportation and being able to get to her appointments.   In the clinic, she was also found to have low blood sugar of 67 which improved with food. She was admitted for further workup of the toe ulcers and for consulting wound care.   FH: hypertension and diabetes in both sides SH: she continues to smoke 7 cigarettes a day and has been doing that for >10 years No recreational drug use and no alcohol use Lives with her daughter  Hospital Course by problem list: Active Problems:   Diabetic ulcer of left foot ([place])   Feet ulcers: several ulcers on the toes and heel. Her left toe ulcer was there for a year, and then oneon the  heel started 3 weeks ago when she was walking to the church and she noticed skin peeling. Then she noticed some dark red/yellow drainage from the toe ulcer. She endorses some edema of the feet which she has chronically. Also, she has neuropathy of feet for many years. She denies any trauma or stepping on any nail. She also has wound on her right medial leg above the ankle that happened after she spilled some food there for 2-3 weeks. There are several other lesions on the legs. CBC does not show white count. She has not been following at the wound care center. In the clinic, pulses were dopplerably but not palpable. Xray was unremarkable. MRi of the feet ruled out osteomyelitis or cellulitis. Dr. Sharol Given was consulted and had no surgical recommendations- she will follow up in his office in 2 weeks, an appointment was coordinated. ABIs were done and they were unremarkable. On the day of discharge, talked to wound care again, Dr duda recommended power chair instead of off-loading shoes- pt does not want power chair, so PT re-evaluated her and pt will provide wheelchair. Social work consulted again and explained importance of not missing any of the outpatient appointments as pt has missed a lot of her appointments. Pt has enough pain medicines for the next 5 months   Hypokalemia: K of 3.1. It was repleted accordingly. Her potassium normalized on the day of discharge  Elevated creatinine: Pt creatinine steadily rising,baseline around 1.27 5 months ago, today it was 2.57. As pt was asymptomatic, and BUN was normal, pt will follow up closely next week for a repeat BMET, and she is advised to stay properly hydrated. She was ordered FeNa, and urine protein creatinine ratio   Hypertension: She was hospitalized 12/03/2015 to 12/06/2015 for symptomatic hypertension. Her BP improved with restart of her home medications. BP still elevated- as she did not take her meds. Her blood pressure has been stable and she is on  amlodipine, losartan, hctz   T2DM: A1c of 7.2 . No episodes of hypoglycemia after insulin decreased to 25 units bid. She is sent home on insuline novolog 70/30 25 units bid and given glucerna on discharge as insurance pays for that   History fo PE- she is compliant with her xarelto and xarelto was continued.  Discharge Vitals:   BP 131/66 mmHg  Pulse 83  Temp(Src) 98 F (36.7 C) (Oral)  Resp 21  Ht 5\' 9"  (1.753 m)  Wt 234 lb 4.8 oz (106.278 kg)  BMI 34.58 kg/m2  SpO2 95%  Discharge Labs:  Results for orders placed or performed during the hospital encounter of 01/01/16 (from the past 24 hour(s))  Glucose, capillary     Status: Abnormal   Collection Time: 01/02/16  5:22 PM  Result Value Ref Range   Glucose-Capillary 176 (H) 65 - 99 mg/dL  Glucose, capillary     Status: None   Collection Time: 01/02/16  9:57 PM  Result Value Ref Range   Glucose-Capillary 88 65 - 99 mg/dL  CBC     Status: Abnormal   Collection Time: 01/03/16  6:28 AM  Result Value Ref Range   WBC 9.3 4.0 - 10.5 K/uL   RBC 4.42 3.87 - 5.11 MIL/uL   Hemoglobin 11.6 (L) 12.0 - 15.0 g/dL   HCT 36.6 36.0 - 46.0 %   MCV 82.8 78.0 - 100.0 fL   MCH 26.2 26.0 - 34.0 pg   MCHC 31.7 30.0 - 36.0 g/dL   RDW 17.8 (H) 11.5 - 15.5 %   Platelets 342 150 - 400 K/uL  Basic metabolic panel     Status: Abnormal   Collection Time: 01/03/16  6:28 AM  Result Value Ref Range   Sodium 140 135 - 145 mmol/L   Potassium 3.9 3.5 - 5.1 mmol/L   Chloride 111 101 - 111 mmol/L   CO2 20 (L) 22 - 32 mmol/L   Glucose, Bld 112 (H) 65 - 99 mg/dL   BUN 19 6 - 20 mg/dL   Creatinine, Ser 2.54 (H) 0.44 - 1.00 mg/dL   Calcium 8.1 (L) 8.9 - 10.3 mg/dL   GFR calc non Af Amer 19 (L) >60 mL/min   GFR calc Af Amer 22 (L) >60 mL/min   Anion gap 9 5 - 15  Glucose, capillary     Status: Abnormal   Collection Time: 01/03/16  8:05 AM  Result Value Ref Range   Glucose-Capillary 127 (H) 65 - 99 mg/dL  Glucose, capillary     Status: Abnormal    Collection Time: 01/03/16 11:56 AM  Result Value Ref Range   Glucose-Capillary 266 (H) 65 - 99 mg/dL    Signed: [name], MD 01/03/2016, 3:53 PM    Services Ordered on Discharge:  Equipment Ordered on Discharge:

## 2016-01-03 NOTE — Progress Notes (Deleted)
Patient will DC to: [facility] Anticipated DC date: 01/03/16 Family notified: Wife, Jethro Bastos by: PTAR 3pm  CSW signing off.  Cedric Fishman, [place] Social Worker [phone]

## 2016-01-03 NOTE — Care Management Note (Addendum)
Case Management Note  Patient Details  Name: Joy Butler MRN: [id number] Date of Birth: 08/25/1951  Subjective/Objective:    Date: 01/03/16 Spoke with patient at the bedside.  Introduced self as Tourist information centre manager and explained role in discharge planning and how to be reached.  Verified patient lives in town, with spouse.  Has DME a cane.  Expressed potential need for  [employer] chair.  She did not like the kneeling scooter , too hard for her to work with,so she wanted a w/chair.  [name] with [facility] will bring up a w/chair to patients room.  Verified patient anticipates to go home with family, at time of discharge and will have full-time supervision by family at this time to best of their knowledge.  Patient denied needing help with their medication.  Patient is driven by her spoue 's mother to MD appointments.  Verified patient has PCP  Billie Lade.  Patient would like to resume services with [facility], for wound care, HHPT, and Education officer, museum.  [name] notified patient will be dc today.  Patient states she will need a cab to get home, her spouse is at the house and will help her to get into w/chair into the house.  CSW aware.  NCM informed floor RN patient needs offloading shoes thru orthopedics, she will put order in.  Plan: CM will continue to follow for discharge planning and [facility] resources.                 Action/Plan:   Expected Discharge Date:                  Expected Discharge Plan:  [place]  In-House Referral:  Clinical Social Work  Discharge planning Services  CM Consult  Post Acute Care Choice:  Resumption of Svcs/PTA Provider Choice offered to:     DME Arranged:    DME Agency:     HH Arranged:  RN, PT, Social Work [employer] Agency:     Status of Service:  Completed, signed off  Medicare Important Message Given:    Date Medicare IM Given:    Medicare IM give by:    Date Additional Medicare IM Given:    Additional Medicare Important Message give by:      If discussed at [place] of Stay Meetings, dates discussed:    Additional Comments:  Zenon Mayo, RN 01/03/2016, 1:35 PM

## 2016-01-03 NOTE — Progress Notes (Signed)
Physical Therapy Treatment Patient Details Name: Joy Butler MRN: [id number] DOB: 1951-12-21 Today's Date: 01/03/2016    History of Present Illness 65 y/o female with PMH of HTN, PE on xarelto, DM, HLD, diabetic neuropathy, IBS, recent admission for uncontrolled HTN who p/w non healing foot ulcers including an ulcer at the base of the great toe (present for a year), left heel ulcer (present for 3 weeks) and R medial ankle ulcer (2-3 weeks). Patient states she developed L heel ulcer 3 weeks ago while walking and she also noted some mild yellowish drainage form left toe ulcer. Drainage has since resolved. She complains of low grade fevers at home beginning yesterday assoc with chills.    PT Comments    Did trial with knee scooter and pt very apprehensive with its use.  Pt concerned about her R LE strength due to it giving way.  Feel that pt is not safe with the knee scooter and would do better with w/c for mobility to maintain L NWB.  Pt has difficulty maintaining L LE NWB even with cues during transfers. Pt more awake than this AM session and informed PT that her landlord said she would put up a ramp if the MD ordered it.  Recommend w/c and RW.  Spoke to CM about d/c needs including HHPT, ramp, DME, and transportation issues.  Follow Up Recommendations  Home health PT;Supervision for mobility/OOB     Equipment Recommendations  Wheelchair (measurements PT)    Recommendations for Other Services OT consult     Precautions / Restrictions Precautions Precautions: Fall Precaution Comments: Pt reports 4-5 falls in last 6 months Restrictions Weight Bearing Restrictions: Yes LLE Weight Bearing: Non weight bearing    Mobility  Bed Mobility Overal bed mobility: Modified Independent             General bed mobility comments: sitting EOB upon arrival  Transfers Overall transfer level: Needs assistance Equipment used:  (knee scooter) Transfers: Sit to/from Stand Sit to Stand: Min  assist Stand pivot transfers: Min assist       General transfer comment: Stood to knee walker with difficulty coordinating holding brakes and pushing up from standing.  Even with cueing when transferring off of knee walker, pt starting to put weight on L LE.  Ambulation/Gait Ambulation/Gait assistance: Min assist Ambulation Distance (Feet): 3 Feet Assistive device:  (knee walker) Gait Pattern/deviations: Step-to pattern     General Gait Details: Pt took a few steps with R LE, but was very apprehensive with knee walker.  Pt did not like it.   Stairs            Wheelchair Mobility    Modified Rankin (Stroke Patients Only)       Balance Overall balance assessment: Needs assistance   Sitting balance-Leahy Scale: Good       Standing balance-Leahy Scale: Poor Standing balance comment: requires B UE support due to L LE NWB                    Cognition Arousal/Alertness: Awake/alert Behavior During Therapy: WFL for tasks assessed/performed Overall Cognitive Status: Within Functional Limits for tasks assessed       Memory: Decreased recall of precautions              Exercises General Exercises - Lower Extremity Ankle Circles/Pumps: AROM;Both;Seated Long Arc Quad: Strengthening;Both;10 reps;Seated Hip ABduction/ADduction: Strengthening;Both;10 reps;Seated Hip Flexion/Marching: Strengthening;Both;10 reps;Seated    General Comments        Pertinent  Vitals/Pain Pain Assessment: Faces Pain Score: 8  Faces Pain Scale: Hurts little more Pain Location: head and back Pain Descriptors / Indicators: Aching;Constant Pain Intervention(s): Repositioned    Home Living                      Prior Function            PT Goals (current goals can now be found in the care plan section) Acute Rehab PT Goals Patient Stated Goal: Get stronger PT Goal Formulation: With patient Time For Goal Achievement: 01/17/16 Potential to Achieve Goals:  Fair Progress towards PT goals: Progressing toward goals    Frequency  Min 3X/week    PT Plan Discharge plan needs to be updated;Equipment recommendations need to be updated    Co-evaluation             End of Session Equipment Utilized During Treatment: Gait belt Activity Tolerance: Patient tolerated treatment well Patient left: in chair;with call bell/phone within reach;with chair alarm set     Time: [id number] PT Time Calculation (min) (ACUTE ONLY): 15 min  Charges:  $Therapeutic Activity: 8-22 mins                    G Codes:      Lanah Steines LUBECK 01/03/2016, 1:18 PM

## 2016-01-03 NOTE — Progress Notes (Signed)
[facility]  Patient Status: Active (receiving services up to time of hospitalization)  [facility] is providing the following services: RN, PT and MSW  If patient discharges after hours, please call [phone].   Joy Butler 01/03/2016, 1:00 PM

## 2016-01-03 NOTE — Progress Notes (Signed)
Orthopedic Tech Progress Note Patient Details:  Joy Butler [birth year] [id number]  Ortho Devices Type of Ortho Device: Darco shoe Ortho Device/Splint Location: lle Ortho Device/Splint Interventions: Ordered, Application Applied darco shoe per dr. order  Karolee Stamps 01/03/2016, 4:55 PM

## 2016-01-03 NOTE — Progress Notes (Signed)
Reviewed discharge paperwork with pt.  PIV removed.  Prescription, note, and voucher given.  Wheelchair and shoe with pt. Pt taken to discharge location via wheelchair.

## 2016-01-03 NOTE — Progress Notes (Addendum)
CSW consulted for transportation home. Patient reported having no one to pick her up at discharge and has no money with her. Patient reported that her husband can receive her and help her up the steps once she gets home.  CSW provided patient with a taxi voucher.  Cedric Fishman, [place] Social Worker [phone]

## 2016-01-03 NOTE — Progress Notes (Addendum)
VASCULAR LAB PRELIMINARY  PRELIMINARY  PRELIMINARY  PRELIMINARY   ABI completed:    RIGHT    LEFT    PRESSURE WAVEFORM  PRESSURE WAVEFORM  BRACHIAL 150 Triphasic BRACHIAL 141 Triphasic  DP 132 Triphasic DP 140 Triphasic  PT 88 monophasic PT 143 Triphasic  GREAT TOE 78 NA GREAT TOE 64 NA    RIGHT LEFT  ABI 0.88 0.95  TBI 0.52 0.43   ABI on the Right suggest Mild arterial insufficiency disease at rest. ABI on the Left suggest are within normal range at rest. TBI are suggest abnormal bilaterally.  Joy Butler, RVT 01/03/2016, 11:07 AM

## 2016-01-03 NOTE — Evaluation (Addendum)
Occupational Therapy Evaluation Patient Details Name: Joy Butler MRN: [id number] DOB: 1951/10/29 Today's Date: 01/03/2016    History of Present Illness 65 y/o female with PMH of HTN, PE on xarelto, DM, HLD, diabetic neuropathy, IBS, recent admission for uncontrolled HTN who p/w non healing foot ulcers including an ulcer at the base of the great toe (present for a year), left heel ulcer (present for 3 weeks) and R medial ankle ulcer (2-3 weeks). Patient states she developed L heel ulcer 3 weeks ago while walking and she also noted some mild yellowish drainage form left toe ulcer. Drainage has since resolved. She complains of low grade fevers at home beginning yesterday assoc with chills.   Clinical Impression   Patient presenting with decreased ADL and functional mobility independence secondary to above. Patient independent, but with multiple falls PTA. Patient currently functioning at an overall min to mod assist level. Patient will benefit from acute OT to increase overall independence in the areas of ADLs, functional mobility, and overall safety in order to safely discharge to venue listed below. At this time, do not feel patient is safe to go home. Clinically believe patient is a good candidate for ST SNF prior to her discharge home. Pt does not adhere to NWB to her LLE and had a couple episodes of LOB during SPT's during OT evaluation.  Addendum - IF pt ends up going home, she will need [place] 24/7 supervision/assistance.     Follow Up Recommendations  SNF;Supervision/Assistance - 24 hour    Equipment Recommendations  Other (comment);3 in 1 bedside comode (TBD)    Recommendations for Other Services  None at this time    Precautions / Restrictions Precautions Precautions: Fall Precaution Comments: Pt reports ~2 falls per month Restrictions Weight Bearing Restrictions: Yes LLE Weight Bearing: Non weight bearing    Mobility Bed Mobility General bed mobility comments: Pt  found seated in recliner upon OT entering/exiting room   Transfers Overall transfer level: Needs assistance Equipment used: Rolling walker (2 wheeled) Transfers: Sit to/from Stand Sit to Stand: Min assist Stand pivot transfers: Mod assist       General transfer comment: Pt did not adhere to NWB even with maximal multimodal cueing from therapist. Pt had multiple LOB during transfer recliner to/from elevated toilet seat in BR.     Balance Overall balance assessment: Needs assistance;History of Falls Sitting-balance support: No upper extremity supported;Feet supported Sitting balance-Leahy Scale: Fair     Standing balance support: Bilateral upper extremity supported;During functional activity Standing balance-Leahy Scale: Poor Standing balance comment: requires BUE support due to LLE NWB and decreased overall balance in standing    ADL Overall ADL's : Needs assistance/impaired Eating/Feeding: Set up;Sitting   Grooming: Set up;Sitting Grooming Details (indicate cue type and reason): pt unable to safely stand for grooming tasks Upper Body Bathing: Minimal assitance;Sitting   Lower Body Bathing: Maximal assistance;Sit to/from stand   Upper Body Dressing : Minimal assistance;Sitting   Lower Body Dressing: Maximal assistance;Sit to/from stand   Toilet Transfer: Moderate assistance;RW;Grab bars;Comfort height toilet   Toileting- Clothing Manipulation and Hygiene: Supervision/safety;Sitting/lateral lean Toileting - Clothing Manipulation Details (indicate cue type and reason): will require increased assistance for sit to/from stand during toileting tasks    Tub/Shower Transfer Details (indicate cue type and reason): did not occur, pt will not safely be able to transfer in/out of a tub/shower using just a shower seat due to NWB LLE Functional mobility during ADLs: Moderate assistance;Rolling walker;Cueing for safety;Cueing for sequencing General  ADL Comments: Pt unsafe on feet, pt did  not adhere to NWB. Patient putting at least 50% weight thru LLE and not listening to therapists suggestions on safest/most effective way to perform transfers using RW while adhering to NWB.     Vision Additional Comments: pt barely able to keep eyes open during session          Pertinent Vitals/Pain Pain Assessment: Faces Faces Pain Scale: Hurts little more Pain Location: LLE Pain Descriptors / Indicators: Aching Pain Intervention(s): Monitored during session     Hand Dominance Right   Extremity/Trunk Assessment Upper Extremity Assessment Upper Extremity Assessment: Generalized weakness   Lower Extremity Assessment Lower Extremity Assessment: Defer to PT evaluation   Cervical / Trunk Assessment Cervical / Trunk Assessment: Normal   Communication Communication Communication: No difficulties   Cognition Arousal/Alertness: Lethargic;Suspect due to medications Behavior During Therapy: [facility] for tasks assessed/performed Overall Cognitive Status: Within Functional Limits for tasks assessed       Memory: Decreased recall of precautions             Home Living Family/patient expects to be discharged to:: Private residence Living Arrangements: Spouse/significant other Available Help at Discharge: Family;Available 24 hours/day Type of Home: House Home Access: Stairs to enter [employer] of Steps: 4 Entrance Stairs-Rails: Right;Left;Can reach both Home Layout: One level     Bathroom Shower/Tub: Tub/shower unit;Curtain   Biochemist, clinical: Standard     Home Equipment: Cane - single point;Shower seat   Prior Functioning/Environment Level of Independence: Independent with assistive device(s)  Comments: uses cane    OT Diagnosis: Generalized weakness;Acute pain   OT Problem List: Decreased strength;Decreased activity tolerance;Impaired balance (sitting and/or standing);Decreased knowledge of use of DME or AE;Decreased safety awareness;Decreased knowledge of  precautions   OT Treatment/Interventions: Self-care/ADL training;Therapeutic exercise;Energy conservation;DME and/or AE instruction;Therapeutic activities;Patient/family education;Balance training    OT Goals(Current goals can be found in the care plan section) Acute Rehab OT Goals Patient Stated Goal: none stated OT Goal Formulation: With patient Time For Goal Achievement: 01/17/16 Potential to Achieve Goals: Good ADL Goals Pt Will Perform Lower Body Bathing: with supervision;sit to/from stand (using AE prn) Pt Will Perform Lower Body Dressing: sit to/from stand;with supervision (using AE prn) Pt Will Transfer to Toilet: with supervision;stand pivot transfer;bedside commode Additional ADL Goal #1: Pt will be supervision for functional transfers  Additional ADL Goal #2: Pt will verbalize and adhere to LLE NWB during ADLs and functional mobility/transfers  OT Frequency: Min 2X/week   Barriers to D/C: Pt reports her husband can assist 24/7    End of Session Equipment Utilized During Treatment: Rolling walker Nurse Communication: Weight bearing status;Mobility status (Discussed patient's NWB to LLE with NT, NT stated she did not know this. According to NT and pt, pt has been ambualting to/from BR. Discussed patient's need for a BSC. IF no BSC, pt needs to be pushed to BR door in recliner and perform SPT using RW)  Activity Tolerance: Patient tolerated treatment well Patient left: in chair;with call bell/phone within reach;with chair alarm set   Time: 1452-1506 OT Time Calculation (min): 14 min Charges:  OT General Charges $OT Visit: 1 Procedure OT Evaluation $OT Eval Moderate Complexity: 1 Procedure G-Codes: OT G-codes **NOT FOR INPATIENT CLASS** Functional Limitation: Self care Self Care Current Status [id number]): At least 40 percent but less than 60 percent impaired, limited or restricted (up to mod assist) Self Care Goal Status [id number]): At least 1 percent but less than 20 percent  impaired, limited  or restricted (overall supervision sitting or sit to/from stand)  Chrys Racer , MS, OTR/L, CLT Pager: [phone]  01/03/2016, 3:45 PM

## 2016-01-03 NOTE — Evaluation (Signed)
Physical Therapy Evaluation Patient Details Name: Joy Butler MRN: [id number] DOB: Aug 03, 1951 Today's Date: 01/03/2016   History of Present Illness  65 y/o female with PMH of HTN, PE on xarelto, DM, HLD, diabetic neuropathy, IBS, recent admission for uncontrolled HTN who p/w non healing foot ulcers including an ulcer at the base of the great toe (present for a year), left heel ulcer (present for 3 weeks) and R medial ankle ulcer (2-3 weeks). Patient states she developed L heel ulcer 3 weeks ago while walking and she also noted some mild yellowish drainage form left toe ulcer. Drainage has since resolved. She complains of low grade fevers at home beginning yesterday assoc with chills.  Clinical Impression  Pt admitted with above diagnosis. Pt currently with functional limitations due to the deficits listed below (see PT Problem List).  Pt will benefit from skilled PT to increase their independence and safety with mobility to allow discharge to the venue listed below.  Pt UNABLE to maintain NWB status with SPT.  Pt has 4 steps to enter home.  While PT was seeing pt, order was placed for PT to assess pt with knee scooter.  When returned with knee scooter, pt off of the floor for test.  Will return later to assess how she does with it and further assess dc plan and DME needs. Pt does state she has had more weakness in the last month and 4-5 falls in last 6 months.     Follow Up Recommendations Supervision for mobility/OOB;Home health PT;SNF    Equipment Recommendations  Rolling walker with 5" wheels    Recommendations for Other Services OT consult     Precautions / Restrictions Precautions Precautions: Fall Precaution Comments: Pt reports 4-5 falls in last 6 months Restrictions Weight Bearing Restrictions: Yes LLE Weight Bearing: Non weight bearing      Mobility  Bed Mobility Overal bed mobility: Modified Independent                Transfers Overall transfer level: Needs  assistance Equipment used: Rolling walker (2 wheeled) Transfers: Sit to/from [employer] Transfers Sit to Stand: From elevated surface;Min guard Stand pivot transfers: Min assist       General transfer comment: Stood to RW with cueing for L NWB, but pt immediately put weight on L LE to reposition R foot. Pt able to stand with L LE NWB. PT c/o R LE weakness and fatigue. Performed SPT with RW and pt unable to remain NWB on L LE.  Was not putting full weight, but probably 25%.  Ambulation/Gait             General Gait Details: deferred secondary to inability to maintain NWB at this time.  Stairs            Wheelchair Mobility    Modified Rankin (Stroke Patients Only)       Balance Overall balance assessment: Needs assistance   Sitting balance-Leahy Scale: Good       Standing balance-Leahy Scale: Poor Standing balance comment: requires B UE support due to L LE NWB                             Pertinent Vitals/Pain Pain Assessment: 0-10 Pain Score: 8  Pain Location: head and back Pain Descriptors / Indicators: Aching;Constant Pain Intervention(s): Monitored during session;Patient requesting pain meds-RN notified;Limited activity within patient's tolerance;Repositioned    Home Living Family/patient expects to be discharged to::  Private residence Living Arrangements: Spouse/significant other   Type of Home: House Home Access: Stairs to enter Entrance Stairs-Rails: Right;Left;Can reach both Technical brewer of Steps: 4 Home Layout: One level Home Equipment: [place] - single point;Shower seat      Prior Function Level of Independence: Independent with assistive device(s)         Comments: uses cane     Hand Dominance   Dominant Hand: Right    Extremity/Trunk Assessment               Lower Extremity Assessment: RLE deficits/detail;LLE deficits/detail RLE Deficits / Details: R hip 3+/5, R knee ext 4-/5 LLE Deficits /  Details: L hip 4-/5, L knee ext 4/5  Cervical / Trunk Assessment: Normal  Communication   Communication: No difficulties  Cognition Arousal/Alertness: Awake/alert Behavior During Therapy: WFL for tasks assessed/performed Overall Cognitive Status: Within Functional Limits for tasks assessed       Memory: Decreased recall of precautions              General Comments      Exercises General Exercises - Lower Extremity Ankle Circles/Pumps: AROM;Both;Seated Long Arc Quad: Strengthening;Both;10 reps;Seated Hip ABduction/ADduction: Strengthening;Both;10 reps;Seated Hip Flexion/Marching: Strengthening;Both;10 reps;Seated      Assessment/Plan    PT Assessment Patient needs continued PT services  PT Diagnosis Difficulty walking   PT Problem List Decreased strength;Decreased activity tolerance;Decreased balance;Decreased mobility;Decreased knowledge of precautions;Decreased knowledge of use of DME;Impaired sensation  PT Treatment Interventions DME instruction;Gait training;Functional mobility training;Therapeutic activities;Therapeutic exercise;Balance training;Stair training   PT Goals (Current goals can be found in the Care Plan section) Acute Rehab PT Goals Patient Stated Goal: Get stronger PT Goal Formulation: With patient Time For Goal Achievement: 01/17/16 Potential to Achieve Goals: Fair    Frequency Min 3X/week   Barriers to discharge Inaccessible home environment 4 steps to enter home    Co-evaluation               End of Session Equipment Utilized During Treatment: Gait belt Activity Tolerance: Patient limited by fatigue Patient left: in chair;with call bell/phone within reach;with chair alarm set Nurse Communication: Mobility status;Weight bearing status         Time: [id number] PT Time Calculation (min) (ACUTE ONLY): 26 min   Charges:   PT Evaluation $PT Eval Moderate Complexity: 1 Procedure PT Treatments $Therapeutic Activity: 8-22 mins   PT  G Codes:        Joy Butler 01/03/2016, 10:28 AM

## 2016-01-03 NOTE — Progress Notes (Signed)
Before admission, patient given copy of letter from [place] Director and Social worker outlining options for her to contact to arrange her own transportation and explaining the need for her to come to her physician office visit appointments on a consistent basis so that we can provide needed medical care (see EPIC letter in patient's chart). Patient verbalized understanding of the information in these letters and responded that she would follow up with the clinic after discharge from this admission. She also noted she would call the transportation sources listed to make arrangements for this hospital follow up appointment. Yvonna Alanis, RN, late chart 01/03/16 2P for 01/01/16 12 noon patient discussion.

## 2016-01-03 NOTE — Consult Note (Signed)
WOC re-consulted for offloading shoe for left foot.  WOC reviewed chart and noted Dr. Sharol Given has also seen this patient.  Contacted Dr. Sharol Given to identify if patient needs custom offloading boot due to patient having 2 ulcers plantar surface left foot.  Dr. Sharol Given has request kneeling scooter for complete NWB on the left foot in order to increase chance of healing neuropathic foot ulcers.  I have contacted CM to notified them of same and placed order in the computer for PT to assist with obtaining scooter and with training for use.   [place], [place]

## 2016-01-03 NOTE — Consult Note (Signed)
Reason for Consult: Diabetic insensate neuropathic ulcers left foot and a burn on the right leg Referring Physician: Dr. [name] is an 65 y.o. female.  HPI: Patient is a 65 year old woman with diabetic insensate neuropathy with a chronic ulcer beneath the great toe metatarsal head on the left foot who has developed a new ulcer on the hindfoot and has a new burn on her right leg secondary to spilling string bean juice on her right leg  Past Medical History  Diagnosis Date  . Hypertension   . Pulmonary embolism ([place])     2011 treated at [facility] in [place].  Was on Coumadin for over a year.  no known family history  . UTI (urinary tract infection)   . High cholesterol   . 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 Depression   . Neuropathy (HCC)     feet  . Kidney stones   . Pneumonia     "several times" (07/26/2014)  . Type II diabetes mellitus ([place]) dx'd 2000  . Daily headache   . Migraine     "last one was in the 1990's" (07/26/2014)  . Arthritis     "knees" (07/26/2014)  . Chronic back pain   . Cataract of both eyes   . Hypertensive emergency 12/03/2015    Past Surgical History  Procedure Laterality Date  . Cholecystectomy      1980  . Esophagogastroduodenoscopy  08/19/2012    Procedure: ESOPHAGOGASTRODUODENOSCOPY (EGD);  Surgeon: Winfield Cunas., MD;  Location: [facility] ENDOSCOPY;  Service: Endoscopy;  Laterality: N/A;  . Cesarean section  1982  . Carpal tunnel release Bilateral   . Tonsillectomy    . Artery biopsy Right 05/09/2014    Procedure: BIOPSY TEMPORAL ARTERY;  Surgeon: Rosetta Posner, MD;  Location: [facility];  Service: Vascular;  Laterality: Right;  . Appendectomy    . Vaginal hysterectomy    . Dilation and curettage of uterus  1982  . Cystoscopy w/ stone manipulation    . Breast biopsy Left   . Temporal artery biopsy / ligation Right 04/2014  . Eye surgery Bilateral   . Refractive surgery Bilateral   . Esophagogastroduodenoscopy (egd) with  propofol N/A 12/06/2015    Procedure: ESOPHAGOGASTRODUODENOSCOPY (EGD) WITH PROPOFOL;  Surgeon: Wilford Corner, MD;  Location: [facility] ENDOSCOPY;  Service: Endoscopy;  Laterality: N/A;  . Colonoscopy with propofol N/A 12/06/2015    Procedure: COLONOSCOPY WITH PROPOFOL;  Surgeon: Wilford Corner, MD;  Location: [facility] ENDOSCOPY;  Service: Endoscopy;  Laterality: N/A;    Family History  Problem Relation Age of Onset  . Adopted: Yes  . Hypertension Mother     Social History:  reports that she has been smoking Cigarettes.  She has a 11 pack-year smoking history. She has never used smokeless tobacco. She reports that she does not drink alcohol or use illicit drugs.  Allergies:  Allergies  Allergen Reactions  . Keflex [Cephalexin] Itching and Swelling    Lips and eyes swelled up  . Clindamycin/Lincomycin Itching  . Latex Itching  . Sulfa Antibiotics Rash    Medications: I have reviewed the patient's current medications.  Results for orders placed or performed during the hospital encounter of 01/01/16 (from the past 48 hour(s))  Comprehensive metabolic panel     Status: Abnormal   Collection Time: 01/01/16  5:14 PM  Result Value Ref Range   Sodium 142 135 - 145 mmol/L   Potassium 3.2 (L) 3.5 -  5.1 mmol/L   Chloride 110 101 - 111 mmol/L   CO2 22 22 - 32 mmol/L   Glucose, Bld 126 (H) 65 - 99 mg/dL   BUN 15 6 - 20 mg/dL   Creatinine, Ser 1.71 (H) 0.44 - 1.00 mg/dL   Calcium 8.5 (L) 8.9 - 10.3 mg/dL   Total Protein 6.8 6.5 - 8.1 g/dL   Albumin 2.1 (L) 3.5 - 5.0 g/dL   AST 18 15 - 41 U/L   ALT 12 (L) 14 - 54 U/L   Alkaline Phosphatase 118 38 - 126 U/L   Total Bilirubin 0.3 0.3 - 1.2 mg/dL   GFR calc non Af Amer 30 (L) >60 mL/min   GFR calc Af Amer 35 (L) >60 mL/min    Comment: (NOTE) The eGFR has been calculated using the CKD EPI equation. This calculation has not been validated in all clinical situations. eGFR's persistently <60 mL/min signify possible Chronic Kidney Disease.     Anion gap 10 5 - 15  CBC     Status: Abnormal   Collection Time: 01/01/16  5:14 PM  Result Value Ref Range   WBC 8.9 4.0 - 10.5 K/uL   RBC 5.01 3.87 - 5.11 MIL/uL   Hemoglobin 13.2 12.0 - 15.0 g/dL   HCT 40.9 36.0 - 46.0 %   MCV 81.6 78.0 - 100.0 fL   MCH 26.3 26.0 - 34.0 pg   MCHC 32.3 30.0 - 36.0 g/dL   RDW 17.1 (H) 11.5 - 15.5 %   Platelets 390 150 - 400 K/uL  Sedimentation rate     Status: Abnormal   Collection Time: 01/01/16  5:14 PM  Result Value Ref Range   Sed Rate 78 (H) 0 - 22 mm/hr  C-reactive protein     Status: Abnormal   Collection Time: 01/01/16  5:14 PM  Result Value Ref Range   CRP 1.9 (H) <1.0 mg/dL  Glucose, capillary     Status: Abnormal   Collection Time: 01/01/16  5:44 PM  Result Value Ref Range   Glucose-Capillary 124 (H) 65 - 99 mg/dL  Glucose, capillary     Status: Abnormal   Collection Time: 01/01/16 11:44 PM  Result Value Ref Range   Glucose-Capillary 107 (H) 65 - 99 mg/dL  CBC     Status: Abnormal   Collection Time: 01/02/16  5:08 AM  Result Value Ref Range   WBC 9.0 4.0 - 10.5 K/uL   RBC 4.46 3.87 - 5.11 MIL/uL   Hemoglobin 11.8 (L) 12.0 - 15.0 g/dL   HCT 36.9 36.0 - 46.0 %   MCV 82.7 78.0 - 100.0 fL   MCH 26.5 26.0 - 34.0 pg   MCHC 32.0 30.0 - 36.0 g/dL   RDW 17.4 (H) 11.5 - 15.5 %   Platelets 383 150 - 400 K/uL  Basic metabolic panel     Status: Abnormal   Collection Time: 01/02/16  5:08 AM  Result Value Ref Range   Sodium 143 135 - 145 mmol/L   Potassium 3.1 (L) 3.5 - 5.1 mmol/L   Chloride 115 (H) 101 - 111 mmol/L   CO2 22 22 - 32 mmol/L   Glucose, Bld 72 65 - 99 mg/dL   BUN 16 6 - 20 mg/dL   Creatinine, Ser 2.13 (H) 0.44 - 1.00 mg/dL   Calcium 7.8 (L) 8.9 - 10.3 mg/dL   GFR calc non Af Amer 23 (L) >60 mL/min   GFR calc Af Amer 27 (L) >60 mL/min  Comment: (NOTE) The eGFR has been calculated using the CKD EPI equation. This calculation has not been validated in all clinical situations. eGFR's persistently <60 mL/min signify  possible Chronic Kidney Disease.    Anion gap 6 5 - 15  Glucose, capillary     Status: None   Collection Time: 01/02/16  7:54 AM  Result Value Ref Range   Glucose-Capillary 67 65 - 99 mg/dL  Glucose, capillary     Status: None   Collection Time: 01/02/16  8:27 AM  Result Value Ref Range   Glucose-Capillary 84 65 - 99 mg/dL  Glucose, capillary     Status: Abnormal   Collection Time: 01/02/16 11:50 AM  Result Value Ref Range   Glucose-Capillary 129 (H) 65 - 99 mg/dL  Glucose, capillary     Status: Abnormal   Collection Time: 01/02/16  5:22 PM  Result Value Ref Range   Glucose-Capillary 176 (H) 65 - 99 mg/dL  Glucose, capillary     Status: None   Collection Time: 01/02/16  9:57 PM  Result Value Ref Range   Glucose-Capillary 88 65 - 99 mg/dL    Dg Foot Complete Left  01/01/2016  CLINICAL DATA:  Left foot ulcers. EXAM: LEFT FOOT - COMPLETE 3+ VIEW COMPARISON:  MRI of July 26, 2014. FINDINGS: There is no evidence of fracture or dislocation. There is no evidence of arthropathy or other focal bone abnormality. Vascular calcifications are noted. Ulceration is seen in the plantar soft tissues posteriorly. IMPRESSION: Plantar soft tissue ulceration is noted posteriorly. No osteomyelitis is noted. Electronically Signed   By: Marijo Conception, M.D.   On: 01/01/2016 17:43    Review of Systems  All other systems reviewed and are negative.  Blood pressure 152/80, pulse 91, temperature 98 F (36.7 C), temperature source Oral, resp. rate 16, height 5' 9" (1.753 m), weight 106.278 kg (234 lb 4.8 oz), SpO2 94 %. Physical Exam On examination patient is alert oriented no adenopathy we will dress normal affect, respiratory effort she has no ascending cellulitis on either leg. Examination of right leg she has a small 2 cm diameter burn with no surrounding cellulitis no drainage no odor she does have a nonviable eschar on the surface. Examination of her left foot she has a Wagner grade 1 ulcer 2 cm in  diameter beneath the first metatarsal head as well as a beneath the plantar aspect of the heel just proximal to the heel pad. There is no cellulitis no drainage no signs of infection. Her MRI scan was reviewed from 8:15 which showed no osteomyelitis. Her MRI scan was reviewed from yesterday which also shows no osteomyelitis. Radiographs shows no bony destructive changes there are calcified vessels on the radiographs. I cannot palpate a dorsalis pedis pulse with patient's foot is warm with no ischemic changes. Assessment/Plan: Assessment: Wagner grade 1 ulcers 2 left foot with a burn on the right calf.  Plan: Would recommend that she continue with the Santyl dressing changes to the right calf as well as to the 2 ulcers on the left foot. Discussed with Patient the importance of being nonweightbearing on the left foot. She should continue with wound care and currently has a home health nurse that provides wound care for the left foot. I will follow-up in the office in 2 weeks. Would recommend ankle-brachial indices to obtain a baseline.  , V 01/03/2016, 7:31 AM

## 2016-01-03 NOTE — Discharge Instructions (Addendum)
°  Please continue with the wound care Please follow up with Dr Sharol Given- we made you an appointment  Please follow up next week in our clinic  Please HOLD the metformin until you see [place] in clinic next week Please take 25 units of novolog 2 times a day instead of 50 units- your insulin will be adjusted at the next clinic visit  Please DO NOT take ibuprofen- as your kidney function has slowed down a little bit- we will repeat it next visit   We also put the order for a wheelchair- please let us know if any problems.   It was a pleasure taking care of you.

## 2016-01-06 ENCOUNTER — Ambulatory Visit: Payer: BLUE CROSS/BLUE SHIELD | Admitting: Surgery

## 2016-01-09 ENCOUNTER — Ambulatory Visit: Payer: BLUE CROSS/BLUE SHIELD | Admitting: Internal Medicine

## 2016-01-13 ENCOUNTER — Ambulatory Visit (INDEPENDENT_AMBULATORY_CARE_PROVIDER_SITE_OTHER): Payer: Medicare Other | Admitting: Internal Medicine

## 2016-01-13 ENCOUNTER — Encounter: Payer: Self-pay | Admitting: Internal Medicine

## 2016-01-13 VITALS — BP 166/107 | HR 96 | Temp 98.2°F | Wt 227.3 lb

## 2016-01-13 DIAGNOSIS — E1122 Type 2 diabetes mellitus with diabetic chronic kidney disease: Secondary | ICD-10-CM

## 2016-01-13 DIAGNOSIS — I129 Hypertensive chronic kidney disease with stage 1 through stage 4 chronic kidney disease, or unspecified chronic kidney disease: Secondary | ICD-10-CM

## 2016-01-13 DIAGNOSIS — L97529 Non-pressure chronic ulcer of other part of left foot with unspecified severity: Secondary | ICD-10-CM | POA: Diagnosis not present

## 2016-01-13 DIAGNOSIS — Z794 Long term (current) use of insulin: Secondary | ICD-10-CM

## 2016-01-13 DIAGNOSIS — N189 Chronic kidney disease, unspecified: Secondary | ICD-10-CM | POA: Diagnosis not present

## 2016-01-13 DIAGNOSIS — E11621 Type 2 diabetes mellitus with foot ulcer: Secondary | ICD-10-CM

## 2016-01-13 DIAGNOSIS — E118 Type 2 diabetes mellitus with unspecified complications: Secondary | ICD-10-CM

## 2016-01-13 DIAGNOSIS — N179 Acute kidney failure, unspecified: Secondary | ICD-10-CM | POA: Diagnosis not present

## 2016-01-13 DIAGNOSIS — I10 Essential (primary) hypertension: Secondary | ICD-10-CM

## 2016-01-13 MED ORDER — INSULIN NPH ISOPHANE & REGULAR (70-30) 100 UNIT/ML ~~LOC~~ SUSP: 20.0000 [IU] | Freq: Two times a day (BID) | SUBCUTANEOUS | Status: DC

## 2016-01-13 NOTE — Patient Instructions (Signed)
1. Please be sure to keep your appointment with Dr. Sharol Given.  I will call you if there are problems with your labwork.  Please STOP taking metformin and DO NOT use NSAIDs (like ibuprofen, naproxen) because of your poor kidney function.  Please DECREASE you insulin from 25 units twice per day to 20 units twice per day with meals.  We ordered your Glucerna shake when you were discharged from the hospital so please check with your pharmacy for this.  If they do not have the prescription, please call us and let us know.   2. Please take all medications as prescribed.    3. If you have worsening of your symptoms or new symptoms arise, please call the clinic [phone]), or go to the ER immediately if symptoms are severe.   Please come back to see Dr. Tiburcio Pea next month.

## 2016-01-13 NOTE — Progress Notes (Signed)
Subjective:    Patient ID: Joy Butler, female    DOB: 1951-09-12, 65 y.o.   MRN: [id number]  HPI Comments: Joy Butler is a 65 year old woman with PMH as below here for hospital follow-up.  She was recently admitted with DFUs concerning for osteomyelitis.  MRI during admission revealed no evidence of osteo.  She is receiving wound care at home and is scheduled to follow-up with Dr. Sharol Given in 1 week and wound care in 2 weeks.  She denies systemic symptoms of infection.      Past Medical History  Diagnosis Date  . Hypertension   . Pulmonary embolism ([place])     2011 treated at [facility] in [place].  Was on Coumadin for over a year.  no known family history  . UTI (urinary tract infection)   . High cholesterol   . 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 Depression   . Neuropathy (HCC)     feet  . Kidney stones   . Pneumonia     "several times" (07/26/2014)  . Type II diabetes mellitus ([place]) dx'd 2000  . Daily headache   . Migraine     "last one was in the 1990's" (07/26/2014)  . Arthritis     "knees" (07/26/2014)  . Chronic back pain   . Cataract of both eyes   . Hypertensive emergency 12/03/2015   Current Outpatient Prescriptions on File Prior to Visit  Medication Sig Dispense Refill  . acetaminophen-codeine (TYLENOL #3) 300-30 MG tablet TAKE 1 TO 2 TABLETS BY MOUTH EVERY 6 HOURS AS NEEDED FOR MODERATE PAIN **MUST LAST 30 DAYS** 45 tablet 4  . albuterol (PROVENTIL HFA;VENTOLIN HFA) 108 (90 BASE) MCG/ACT inhaler Inhale 2 puffs into the lungs once. (Patient taking differently: Inhale 2 puffs into the lungs every 6 (six) hours as needed for wheezing or shortness of breath. )    . amLODipine (NORVASC) 10 MG tablet TAKE 1 TABLET BY MOUTH EVERY DAY 30 tablet 11  . dicyclomine (BENTYL) 20 MG tablet Take 1 tablet (20 mg total) by mouth 4 (four) times daily. 120 tablet 3  . feeding supplement, GLUCERNA SHAKE, (GLUCERNA SHAKE) LIQD Take 237 mLs by mouth daily as needed. 30 Can 0  .  gabapentin (NEURONTIN) 300 MG capsule TAKE 1 CAPSULE BY MOUTH THREE TIMES A DAY 180 capsule 2  . guaiFENesin (MUCINEX) 600 MG 12 hr tablet Take 1 tablet (600 mg total) by mouth 2 (two) times daily. (Patient taking differently: Take 600 mg by mouth 2 (two) times daily as needed for cough or to loosen phlegm. ) 14 tablet 0  . HYDROcodone-homatropine (HYCODAN) 5-1.5 MG/5ML syrup Take 5 mLs by mouth every 6 (six) hours as needed for cough. (Patient not taking: Reported on 01/01/2016) 50 mL 0  . insulin NPH-regular Human (NOVOLIN 70/30) (70-30) 100 UNIT/ML injection Inject 25 Units into the skin 2 (two) times daily with a meal. 30 mL 12  . loratadine (CLARITIN) 10 MG tablet Take 1 tablet (10 mg total) by mouth daily. (Patient taking differently: Take 10 mg by mouth daily as needed for allergies or rhinitis. ) 30 tablet 2  . losartan-hydrochlorothiazide (HYZAAR) 100-25 MG per tablet Take 1 tablet by mouth daily. 90 tablet 2  . omeprazole (PRILOSEC) 20 MG capsule Take 1 capsule (20 mg total) by mouth 2 (two) times daily before a meal. 60 capsule 3  . pravastatin (PRAVACHOL) 40 MG tablet TAKE 1 TABLET BY MOUTH EVERY  EVENING 90 tablet 3  . rivaroxaban (XARELTO) 20 MG TABS tablet Take 1 tablet (20 mg total) by mouth daily with supper. 30 tablet 3  . SANTYL ointment APPLY TO AFFECTED AREA DAILY 90 g 4  . traZODone (DESYREL) 150 MG tablet TAKE 1 TABLET BY MOUTH EVERY NIGHT AT BEDTIME 30 tablet 4   No current facility-administered medications on file prior to visit.    Review of Systems  Constitutional: Negative for fever and chills.  Respiratory: Negative for shortness of breath.   Cardiovascular: Positive for leg swelling. Negative for chest pain and palpitations.  Gastrointestinal: Negative for nausea, vomiting, diarrhea, constipation and blood in stool.  Genitourinary: Negative for dysuria and difficulty urinating.       Filed Vitals:   01/13/16 1539  BP: 166/107  Pulse: 96  Temp: 98.2 F (36.8 C)   TempSrc: Oral  Weight: 227 lb 4.8 oz (103.103 kg)  SpO2: 99%     Objective:   Physical Exam  Constitutional: She is oriented to person, place, and time. She appears well-developed. No distress.  HENT:  Head: Normocephalic and atraumatic.  Mouth/Throat: Oropharynx is clear and moist. No oropharyngeal exudate.  Eyes: Conjunctivae and EOM are normal. Pupils are equal, round, and reactive to light.  Neck: Neck supple.  Cardiovascular: Normal rate, regular rhythm and normal heart sounds.  Exam reveals no gallop and no friction rub.   No murmur heard. 1+ DP on right foot.  Unable to palpate on left foot.  Pulmonary/Chest: Effort normal and breath sounds normal. No respiratory distress. She has no wheezes. She has no rales.  Abdominal: Soft. Bowel sounds are normal. She exhibits no distension. There is no tenderness. There is no rebound and no guarding.  Musculoskeletal: Normal range of motion. She exhibits no edema or tenderness.  Neurological: She is alert and oriented to person, place, and time. No cranial nerve deficit.  Poor sensation on feet.    Skin: Skin is warm. She is not diaphoretic.  Left foot - 2cm ulcer plantar surface of 1st MTP, 2cm ulcer at heel.  Both are dry, no erythema/warmth/drainage.  There are no ulcers on the right foot.    Psychiatric: She has a normal mood and affect. Her behavior is normal. Judgment and thought content normal.  Vitals reviewed.         Assessment & Plan:  Please see problem based charting for A&P.

## 2016-01-14 LAB — BMP8+ANION GAP
Anion Gap: 15 mmol/L (ref 10.0–18.0)
BUN / CREAT RATIO: 8 — AB (ref 11–26)
BUN: 14 mg/dL (ref 8–27)
CO2: 21 mmol/L (ref 18–29)
CREATININE: 1.8 mg/dL — AB (ref 0.57–1.00)
Calcium: 8.9 mg/dL (ref 8.7–10.3)
Chloride: 107 mmol/L — ABNORMAL HIGH (ref 96–106)
GFR, EST AFRICAN AMERICAN: 33 mL/min/{1.73_m2} — AB (ref >59)
GFR, EST NON AFRICAN AMERICAN: 29 mL/min/{1.73_m2} — AB (ref >59)
Glucose: 138 mg/dL — ABNORMAL HIGH (ref 65–99)
Potassium: 4.1 mmol/L (ref 3.5–5.2)
Sodium: 143 mmol/L (ref 134–144)

## 2016-01-14 LAB — GLUCOSE, CAPILLARY: Glucose-Capillary: 130 mg/dL — ABNORMAL HIGH (ref 65–99)

## 2016-01-15 DIAGNOSIS — N179 Acute kidney failure, unspecified: Secondary | ICD-10-CM | POA: Insufficient documentation

## 2016-01-15 DIAGNOSIS — N189 Chronic kidney disease, unspecified: Secondary | ICD-10-CM

## 2016-01-15 NOTE — Assessment & Plan Note (Signed)
Assessment:  Cr 2.5 at hospital discharge.  Up from baseline ~ 1.6.   Plan: Continue to hold metformin due to GFR < 30.  Advised to avoid NSAID use.  Check BMP today.

## 2016-01-15 NOTE — Assessment & Plan Note (Addendum)
BP Readings from Last 3 Encounters:  01/13/16 166/107  01/03/16 131/66  01/01/16 197/105    Lab Results  Component Value Date   NA 143 01/13/2016   K 4.1 01/13/2016   CREATININE 1.80* 01/13/2016    Assessment: Blood pressure control:  uncontrolled Progress toward BP goal:    Comments: Patient says she did not take her meds before visit.  She says she usually takes them every day.  Plan: Medications:  continue current medications:  Amlodipine 10mg  daily, losartan-HCTZ 100/25mg  daily Educational resources provided:   Self management tools provided:   Other plans: Checking BMP today given AKI on CKD during recent admission. She was reminded to take medications every day even when coming to Dr appt.  I have asked her to follow-up with PCP in 1 month.

## 2016-01-15 NOTE — Progress Notes (Signed)
Case discussed with Dr. Wilson at time of visit. We reviewed the resident's history and exam and pertinent patient test results. I agree with the assessment, diagnosis, and plan of care documented in the resident's note. 

## 2016-01-15 NOTE — Assessment & Plan Note (Addendum)
Assessment:  Patient feeling well s/p hospital discharge.  No sign of osteo on MRI.  DFUs w/o sign of infection.  Difficult to palpate DP on left but she had ABIs on admission that suggest normal ABI on left.  No systemic symptoms.  She is scheduled to follow-up with Dr. Sharol Given in 1 week and with wound care in 2 weeks.  There is an Therapist, sports coming to her home to care for wounds.  Plan:  Continue current [place] wound care.  Follow-up with Dr. Sharol Given and wound care as previously scheduled

## 2016-01-15 NOTE — Assessment & Plan Note (Addendum)
Lab Results  Component Value Date   HGBA1C 7.2 01/01/2016   HGBA1C 7.7 05/22/2015   HGBA1C 8.5 02/07/2015     Assessment: Diabetes control:  fair control Progress toward A1C goal:   improving Comments: Reports compliance with Novolin 70/30 - 25 units BID.  Joy Butler was instructed to stop metformin at hospital discharge because of decreased GFR but Joy Butler says Joy Butler did not realize this and has continued to take it.  Joy Butler did not bring her meter but Joy Butler reports CBGs between 65 and 100.  CBG is 130 in clinic.   Plan: Medications:  continue current medications:  DECREASE Novolin 70/30 from 25 units BID to 20 units BID with meals. Home glucose monitoring: yes Frequency:   Timing:   Instruction/counseling given: reminded to bring blood glucose meter & log to each visit Educational resources provided:   Self management tools provided:   Other plans: I have asked her to STOP metformin (GFR < 30).  We will recheck BMP today.  I have asked her to return to see PCP next month for follow-up.

## 2016-01-17 ENCOUNTER — Other Ambulatory Visit: Payer: Self-pay | Admitting: Internal Medicine

## 2016-01-20 NOTE — Progress Notes (Signed)
PT Note: Late entry for G codes  01/28/2016 1545  PT G-Codes **NOT FOR INPATIENT CLASS**  Functional Assessment Tool Used clinical judgement  Functional Limitation Changing and maintaining body position  Changing and Maintaining Body Position Current Status [id number] CI  Changing and Maintaining Body Position Goal Status [id number]) CI

## 2016-02-03 ENCOUNTER — Encounter (HOSPITAL_BASED_OUTPATIENT_CLINIC_OR_DEPARTMENT_OTHER): Payer: BLUE CROSS/BLUE SHIELD | Attending: Internal Medicine

## 2016-02-03 NOTE — Addendum Note (Signed)
Addended by: Hulan Fray on: 02/03/2016 06:03 PM   Modules accepted: Orders

## 2016-02-06 ENCOUNTER — Other Ambulatory Visit: Payer: Self-pay | Admitting: Internal Medicine

## 2016-02-06 NOTE — Telephone Encounter (Signed)
Calling to let us know that pt reports she still has a poor appetite . Wanted to see if we could send a order for Juven. States that the other supplement is not covered by the patients insurance.

## 2016-02-07 ENCOUNTER — Telehealth: Payer: Self-pay | Admitting: Pharmacist

## 2016-02-07 DIAGNOSIS — I2699 Other pulmonary embolism without acute cor pulmonale: Secondary | ICD-10-CM

## 2016-02-07 MED ORDER — RIVAROXABAN 20 MG PO TABS: 20.0000 mg | ORAL_TABLET | Freq: Every day | ORAL | Status: DC

## 2016-02-07 NOTE — Telephone Encounter (Signed)
Joy Butler is a 65 y.o. female who was contacted via telephone for monitoring of rivaroxaban (Xarelto) therapy.    ASSESSMENT Indication(s): PE recurrent Duration: indefinite  Labs:    Component Value Date/Time   AST 18 01/01/2016 1714   ALT 12* 01/01/2016 1714   NA 143 01/13/2016 1648   NA 140 01/03/2016 0628   K 4.1 01/13/2016 1648   CL 107* 01/13/2016 1648   CO2 21 01/13/2016 1648   GLUCOSE 138* 01/13/2016 1648   GLUCOSE 112* 01/03/2016 0628   HGBA1C 7.2 01/01/2016 1028   HGBA1C 10.4* 08/31/2012 2207   BUN 14 01/13/2016 1648   BUN 19 01/03/2016 0628   CREATININE 1.80* 01/13/2016 1648   CREATININE 1.27* 07/17/2015 1653   CALCIUM 8.9 01/13/2016 1648   GFRAA 33* 01/13/2016 1648   GFRAA 51* 07/17/2015 1653   WBC 9.3 01/03/2016 0628   HGB 11.6* 01/03/2016 0628   HCT 36.6 01/03/2016 0628   PLT 342 01/03/2016 0628   Est CrCl 50 mL/min  rivaroxaban (Xarelto) Dose: 20 mg daily  Safety: Patient reports no recent signs or symptoms of bleeding, no signs of symptoms of thromboembolism. Medication changes: no.  Adherence: Patient states she stopped taking ~1 month ago because she did not know she was to continue taking the Xarelto. Provided education on long term anticoagulation. Also contacted [facility] and they state patient is on her last refill so called in further refills for patient. Patient due for PCP appointment---note sent to front desk for scheduling.  Patient Instructions: Patient advised to contact clinic or seek medical attention if signs/symptoms of bleeding or thromboembolism occur. Patient verbalized understanding by repeating back information.  Follow-up No Follow-up on file.  [place] Pharmacist 02/07/2016, 5:16 PM

## 2016-02-12 MED ORDER — JUVEN NUTRIVIGOR PO PACK: 1.0000 | PACK | Freq: Every day | ORAL | Status: DC

## 2016-02-12 NOTE — Telephone Encounter (Signed)
Spoke with RN with [facility], not only is the supplement we ordered (glucerna) not covered, Juven was recommended by wound care nurse to help patient with optimal nutrition while treating wounds.

## 2016-02-19 ENCOUNTER — Telehealth: Payer: Self-pay | Admitting: Internal Medicine

## 2016-02-19 NOTE — Telephone Encounter (Signed)
[facility] states pt has had multiple falls in the last week, no injuries, she tells the [facility] that her legs give out

## 2016-02-19 NOTE — Telephone Encounter (Signed)
[place] states patient having falls.  Please call her back at [phone].

## 2016-02-25 ENCOUNTER — Encounter: Payer: Self-pay | Admitting: Internal Medicine

## 2016-02-25 ENCOUNTER — Ambulatory Visit: Payer: BLUE CROSS/BLUE SHIELD | Admitting: Internal Medicine

## 2016-02-27 ENCOUNTER — Telehealth: Payer: Self-pay | Admitting: Internal Medicine

## 2016-02-27 NOTE — Telephone Encounter (Signed)
TIFFANY FROM [facility] CALLED AND SAID THAT Joy Butler WAS NEVER CALLED IN TO [facility] [address] FOR PATIENT

## 2016-02-27 NOTE — Telephone Encounter (Signed)
i have tried to call [facility] 4 times, the phone is not working correctly at [employer]

## 2016-03-04 ENCOUNTER — Telehealth: Payer: Self-pay | Admitting: Internal Medicine

## 2016-03-04 NOTE — Telephone Encounter (Signed)
FYI Dr. Tiburcio Pea - wound on foot has healed so no longer needing [facility] nursing services

## 2016-03-04 NOTE — Telephone Encounter (Signed)
Calling to let us know that today is her last day with the patient since her foot has healed. If she has any questions we can call back.

## 2016-03-06 ENCOUNTER — Encounter (HOSPITAL_COMMUNITY): Payer: Self-pay

## 2016-03-06 DIAGNOSIS — Z9049 Acquired absence of other specified parts of digestive tract: Secondary | ICD-10-CM | POA: Insufficient documentation

## 2016-03-06 DIAGNOSIS — D123 Benign neoplasm of transverse colon: Secondary | ICD-10-CM | POA: Diagnosis not present

## 2016-03-06 DIAGNOSIS — E1122 Type 2 diabetes mellitus with diabetic chronic kidney disease: Secondary | ICD-10-CM | POA: Diagnosis not present

## 2016-03-06 DIAGNOSIS — Z86711 Personal history of pulmonary embolism: Secondary | ICD-10-CM | POA: Diagnosis not present

## 2016-03-06 DIAGNOSIS — Z87442 Personal history of urinary calculi: Secondary | ICD-10-CM | POA: Diagnosis not present

## 2016-03-06 DIAGNOSIS — M13862 Other specified arthritis, left knee: Secondary | ICD-10-CM | POA: Diagnosis not present

## 2016-03-06 DIAGNOSIS — F17210 Nicotine dependence, cigarettes, uncomplicated: Secondary | ICD-10-CM | POA: Diagnosis not present

## 2016-03-06 DIAGNOSIS — R000 Tachycardia, unspecified: Secondary | ICD-10-CM | POA: Insufficient documentation

## 2016-03-06 DIAGNOSIS — E11621 Type 2 diabetes mellitus with foot ulcer: Secondary | ICD-10-CM | POA: Insufficient documentation

## 2016-03-06 DIAGNOSIS — L97529 Non-pressure chronic ulcer of other part of left foot with unspecified severity: Secondary | ICD-10-CM | POA: Insufficient documentation

## 2016-03-06 DIAGNOSIS — E1140 Type 2 diabetes mellitus with diabetic neuropathy, unspecified: Secondary | ICD-10-CM | POA: Diagnosis not present

## 2016-03-06 DIAGNOSIS — N189 Chronic kidney disease, unspecified: Secondary | ICD-10-CM | POA: Insufficient documentation

## 2016-03-06 DIAGNOSIS — Z794 Long term (current) use of insulin: Secondary | ICD-10-CM | POA: Insufficient documentation

## 2016-03-06 DIAGNOSIS — A084 Viral intestinal infection, unspecified: Principal | ICD-10-CM | POA: Insufficient documentation

## 2016-03-06 DIAGNOSIS — Z7901 Long term (current) use of anticoagulants: Secondary | ICD-10-CM | POA: Diagnosis not present

## 2016-03-06 DIAGNOSIS — R1084 Generalized abdominal pain: Secondary | ICD-10-CM | POA: Diagnosis present

## 2016-03-06 DIAGNOSIS — M13861 Other specified arthritis, right knee: Secondary | ICD-10-CM | POA: Insufficient documentation

## 2016-03-06 DIAGNOSIS — K648 Other hemorrhoids: Secondary | ICD-10-CM | POA: Insufficient documentation

## 2016-03-06 DIAGNOSIS — I129 Hypertensive chronic kidney disease with stage 1 through stage 4 chronic kidney disease, or unspecified chronic kidney disease: Secondary | ICD-10-CM | POA: Insufficient documentation

## 2016-03-06 DIAGNOSIS — L97429 Non-pressure chronic ulcer of left heel and midfoot with unspecified severity: Secondary | ICD-10-CM | POA: Insufficient documentation

## 2016-03-06 DIAGNOSIS — I4581 Long QT syndrome: Secondary | ICD-10-CM | POA: Insufficient documentation

## 2016-03-06 DIAGNOSIS — E876 Hypokalemia: Secondary | ICD-10-CM | POA: Diagnosis not present

## 2016-03-06 DIAGNOSIS — Z86718 Personal history of other venous thrombosis and embolism: Secondary | ICD-10-CM | POA: Insufficient documentation

## 2016-03-06 DIAGNOSIS — K2970 Gastritis, unspecified, without bleeding: Secondary | ICD-10-CM | POA: Insufficient documentation

## 2016-03-06 DIAGNOSIS — K429 Umbilical hernia without obstruction or gangrene: Secondary | ICD-10-CM | POA: Insufficient documentation

## 2016-03-06 DIAGNOSIS — N179 Acute kidney failure, unspecified: Secondary | ICD-10-CM | POA: Diagnosis not present

## 2016-03-06 DIAGNOSIS — E7800 Pure hypercholesterolemia, unspecified: Secondary | ICD-10-CM | POA: Insufficient documentation

## 2016-03-06 NOTE — ED Notes (Signed)
Pt arrives via EMS with c/o generalized abd pain x 3 days, associated with N/V/D, lack of appetite. Unable to tolerate PO. BP-140/90, HR-110, CBG-155

## 2016-03-07 ENCOUNTER — Observation Stay (HOSPITAL_COMMUNITY)
Admission: EM | Admit: 2016-03-07 | Discharge: 2016-03-09 | Disposition: A | Discharge status: Home / Self Care | Payer: Medicare Other | Attending: Oncology | Admitting: Oncology

## 2016-03-07 ENCOUNTER — Emergency Department (HOSPITAL_COMMUNITY): Payer: Medicare Other

## 2016-03-07 DIAGNOSIS — I10 Essential (primary) hypertension: Secondary | ICD-10-CM | POA: Diagnosis present

## 2016-03-07 DIAGNOSIS — E119 Type 2 diabetes mellitus without complications: Secondary | ICD-10-CM

## 2016-03-07 DIAGNOSIS — R1084 Generalized abdominal pain: Secondary | ICD-10-CM

## 2016-03-07 DIAGNOSIS — N179 Acute kidney failure, unspecified: Secondary | ICD-10-CM

## 2016-03-07 DIAGNOSIS — R197 Diarrhea, unspecified: Secondary | ICD-10-CM

## 2016-03-07 DIAGNOSIS — L8990 Pressure ulcer of unspecified site, unspecified stage: Secondary | ICD-10-CM | POA: Insufficient documentation

## 2016-03-07 DIAGNOSIS — I2699 Other pulmonary embolism without acute cor pulmonale: Secondary | ICD-10-CM | POA: Diagnosis present

## 2016-03-07 DIAGNOSIS — K529 Noninfective gastroenteritis and colitis, unspecified: Secondary | ICD-10-CM | POA: Diagnosis present

## 2016-03-07 LAB — COMPREHENSIVE METABOLIC PANEL
ALBUMIN: 2.5 g/dL — AB (ref 3.5–5.0)
ALK PHOS: 113 U/L (ref 38–126)
ALT: 11 U/L — AB (ref 14–54)
ANION GAP: 16 — AB (ref 5–15)
AST: 20 U/L (ref 15–41)
BUN: 15 mg/dL (ref 6–20)
CALCIUM: 9.4 mg/dL (ref 8.9–10.3)
CO2: 24 mmol/L (ref 22–32)
Chloride: 103 mmol/L (ref 101–111)
Creatinine, Ser: 2.59 mg/dL — ABNORMAL HIGH (ref 0.44–1.00)
GFR calc Af Amer: 21 mL/min — ABNORMAL LOW (ref >60)
GFR calc non Af Amer: 18 mL/min — ABNORMAL LOW (ref >60)
GLUCOSE: 169 mg/dL — AB (ref 65–99)
Potassium: 3.4 mmol/L — ABNORMAL LOW (ref 3.5–5.1)
SODIUM: 143 mmol/L (ref 135–145)
Total Bilirubin: 0.6 mg/dL (ref 0.3–1.2)
Total Protein: 7.5 g/dL (ref 6.5–8.1)

## 2016-03-07 LAB — CBC
HCT: 47.7 % — ABNORMAL HIGH (ref 36.0–46.0)
HEMOGLOBIN: 16.3 g/dL — AB (ref 12.0–15.0)
MCH: 27.4 pg (ref 26.0–34.0)
MCHC: 34.2 g/dL (ref 30.0–36.0)
MCV: 80.2 fL (ref 78.0–100.0)
Platelets: 410 10*3/uL — ABNORMAL HIGH (ref 150–400)
RBC: 5.95 MIL/uL — ABNORMAL HIGH (ref 3.87–5.11)
RDW: 17.9 % — ABNORMAL HIGH (ref 11.5–15.5)
WBC: 8.4 10*3/uL (ref 4.0–10.5)

## 2016-03-07 LAB — URINALYSIS, ROUTINE W REFLEX MICROSCOPIC
Glucose, UA: 250 mg/dL — AB
Ketones, ur: 15 mg/dL — AB
Nitrite: NEGATIVE
PH: 5.5 (ref 5.0–8.0)
Protein, ur: >300 mg/dL — AB
SPECIFIC GRAVITY, URINE: 1.04 — AB (ref 1.005–1.030)

## 2016-03-07 LAB — C DIFFICILE QUICK SCREEN W PCR REFLEX
C DIFFICILE (CDIFF) INTERP: NEGATIVE
C Diff antigen: NEGATIVE
C Diff toxin: NEGATIVE

## 2016-03-07 LAB — GLUCOSE, CAPILLARY
GLUCOSE-CAPILLARY: 109 mg/dL — AB (ref 65–99)
Glucose-Capillary: 108 mg/dL — ABNORMAL HIGH (ref 65–99)
Glucose-Capillary: 94 mg/dL (ref 65–99)
Glucose-Capillary: 96 mg/dL (ref 65–99)

## 2016-03-07 LAB — URINE MICROSCOPIC-ADD ON: RBC / HPF: NONE SEEN RBC/hpf (ref 0–5)

## 2016-03-07 LAB — LIPASE, BLOOD: Lipase: 36 U/L (ref 11–51)

## 2016-03-07 LAB — INFLUENZA PANEL BY PCR (TYPE A & B)
H1N1FLUPCR: NOT DETECTED
INFLAPCR: NEGATIVE
INFLBPCR: NEGATIVE

## 2016-03-07 MED ORDER — SODIUM CHLORIDE 0.9 % IV SOLN
INTRAVENOUS | Status: DC
  Administered 2016-03-07: 22:00:00 via INTRAVENOUS
  Administered 2016-03-07: 1000 mL via INTRAVENOUS
  Administered 2016-03-08: 06:00:00 via INTRAVENOUS

## 2016-03-07 MED ORDER — TRAZODONE HCL 100 MG PO TABS
150.0000 mg | ORAL_TABLET | Freq: Every day | ORAL | Status: DC
  Administered 2016-03-07 – 2016-03-08 (×2): 150 mg via ORAL
  Filled 2016-03-07 (×2): qty 1

## 2016-03-07 MED ORDER — GI COCKTAIL ~~LOC~~
30.0000 mL | Freq: Once | ORAL | Status: AC
  Administered 2016-03-07: 30 mL via ORAL
  Filled 2016-03-07: qty 30

## 2016-03-07 MED ORDER — HYDROMORPHONE HCL 1 MG/ML IJ SOLN
0.5000 mg | Freq: Once | INTRAMUSCULAR | Status: AC
  Administered 2016-03-07: 0.5 mg via INTRAVENOUS
  Filled 2016-03-07: qty 1

## 2016-03-07 MED ORDER — RIVAROXABAN 20 MG PO TABS: 20.0000 mg | ORAL_TABLET | Freq: Every day | ORAL | Status: DC

## 2016-03-07 MED ORDER — SODIUM CHLORIDE 0.9 % IV BOLUS (SEPSIS)
500.0000 mL | Freq: Once | INTRAVENOUS | Status: AC
  Administered 2016-03-07: 500 mL via INTRAVENOUS

## 2016-03-07 MED ORDER — INSULIN ASPART 100 UNIT/ML ~~LOC~~ SOLN: 0.0000 [IU] | Freq: Every day | SUBCUTANEOUS | Status: DC

## 2016-03-07 MED ORDER — RIVAROXABAN 15 MG PO TABS
15.0000 mg | ORAL_TABLET | Freq: Every day | ORAL | Status: DC
  Administered 2016-03-07 – 2016-03-08 (×2): 15 mg via ORAL
  Filled 2016-03-07 (×2): qty 1

## 2016-03-07 MED ORDER — ONDANSETRON HCL 4 MG/2ML IJ SOLN
4.0000 mg | Freq: Once | INTRAMUSCULAR | Status: AC
  Administered 2016-03-07: 4 mg via INTRAVENOUS
  Filled 2016-03-07: qty 2

## 2016-03-07 MED ORDER — HYDROCODONE-ACETAMINOPHEN 5-325 MG PO TABS: 1.0000 | ORAL_TABLET | ORAL | Status: DC | PRN

## 2016-03-07 MED ORDER — ACETAMINOPHEN 650 MG RE SUPP: 650.0000 mg | Freq: Four times a day (QID) | RECTAL | Status: DC | PRN

## 2016-03-07 MED ORDER — ACETAMINOPHEN 325 MG PO TABS: 650.0000 mg | ORAL_TABLET | Freq: Four times a day (QID) | ORAL | Status: DC | PRN

## 2016-03-07 MED ORDER — IOHEXOL 300 MG/ML  SOLN
25.0000 mL | INTRAMUSCULAR | Status: DC
  Administered 2016-03-07: 25 mL via ORAL

## 2016-03-07 MED ORDER — POTASSIUM CHLORIDE 20 MEQ/15ML (10%) PO SOLN
40.0000 meq | Freq: Once | ORAL | Status: AC
  Administered 2016-03-07: 40 meq via ORAL
  Filled 2016-03-07: qty 30

## 2016-03-07 MED ORDER — ONDANSETRON HCL 4 MG/2ML IJ SOLN: 4.0000 mg | Freq: Four times a day (QID) | INTRAMUSCULAR | Status: DC | PRN

## 2016-03-07 MED ORDER — DIPHENOXYLATE-ATROPINE 2.5-0.025 MG PO TABS: 1.0000 | ORAL_TABLET | Freq: Four times a day (QID) | ORAL | Status: DC | PRN

## 2016-03-07 MED ORDER — COLLAGENASE 250 UNIT/GM EX OINT
TOPICAL_OINTMENT | Freq: Every day | CUTANEOUS | Status: DC
  Administered 2016-03-08 – 2016-03-09 (×2): via TOPICAL
  Filled 2016-03-07: qty 30

## 2016-03-07 MED ORDER — HYDROMORPHONE HCL 1 MG/ML IJ SOLN
1.0000 mg | Freq: Once | INTRAMUSCULAR | Status: AC
  Administered 2016-03-07: 1 mg via INTRAVENOUS
  Filled 2016-03-07: qty 1

## 2016-03-07 MED ORDER — INSULIN ASPART 100 UNIT/ML ~~LOC~~ SOLN: 0.0000 [IU] | Freq: Three times a day (TID) | SUBCUTANEOUS | Status: DC

## 2016-03-07 MED ORDER — ACETAMINOPHEN-CODEINE #3 300-30 MG PO TABS
1.0000 | ORAL_TABLET | Freq: Four times a day (QID) | ORAL | Status: DC | PRN
  Administered 2016-03-07 – 2016-03-09 (×3): 2 via ORAL
  Filled 2016-03-07 (×4): qty 2

## 2016-03-07 MED ORDER — ONDANSETRON HCL 4 MG PO TABS: 4.0000 mg | ORAL_TABLET | Freq: Four times a day (QID) | ORAL | Status: DC | PRN

## 2016-03-07 MED ORDER — PRAVASTATIN SODIUM 40 MG PO TABS
40.0000 mg | ORAL_TABLET | Freq: Every evening | ORAL | Status: DC
  Administered 2016-03-07 – 2016-03-08 (×2): 40 mg via ORAL
  Filled 2016-03-07 (×2): qty 1

## 2016-03-07 NOTE — ED Provider Notes (Addendum)
CSN: [id number]     Arrival date & time 03/06/16  2349 History  By signing my name below, I, Terrance Branch, attest that this documentation has been prepared under the direction and in the presence of Orpah Greek, MD. Electronically Signed: Randa Evens, ED Scribe. 03/07/2016. 1:55 AM.     Chief Complaint  Patient presents with  . Abdominal Pain   The history is provided by the patient. No language interpreter was used.   HPI Comments: Joy Butler is a 65 y.o. female who presents to the Emergency Department complaining of generalized  abdominal pain onset 3 days prior. Pt reports associated nausea, vomiting x1 and diarrhea. She reports subjective fever during the onset of her symptoms 3 days ago. Pt states that eating maker her diarrhea worse. Pt doesn't report any medications PTA. Pt doesn't report any other symptoms at this time.    Past Medical History  Diagnosis Date  . Hypertension   . Pulmonary embolism ([place])     2011 treated at [facility] in [place].  Was on Coumadin for over a year.  no known family history  . UTI (urinary tract infection)   . High cholesterol   . 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 Depression   . Neuropathy ([facility])     feet  . Kidney stones   . Pneumonia     "several times" (07/26/2014)  . Type II diabetes mellitus ([place]) dx'd 2000  . Daily headache   . Migraine     "last one was in the 1990's" (07/26/2014)  . Arthritis     "knees" (07/26/2014)  . Chronic back pain   . Cataract of both eyes   . Hypertensive emergency 12/03/2015   Past Surgical History  Procedure Laterality Date  . Cholecystectomy      1980  . Esophagogastroduodenoscopy  08/19/2012    Procedure: ESOPHAGOGASTRODUODENOSCOPY (EGD);  Surgeon: Winfield Cunas., MD;  Location: [facility] ENDOSCOPY;  Service: Endoscopy;  Laterality: N/A;  . Cesarean section  1982  . Carpal tunnel release Bilateral   . Tonsillectomy    . Artery biopsy Right 05/09/2014    Procedure: BIOPSY  TEMPORAL ARTERY;  Surgeon: Rosetta Posner, MD;  Location: [facility];  Service: Vascular;  Laterality: Right;  . Appendectomy    . Vaginal hysterectomy    . Dilation and curettage of uterus  1982  . Cystoscopy w/ stone manipulation    . Breast biopsy Left   . Temporal artery biopsy / ligation Right 04/2014  . Eye surgery Bilateral   . Refractive surgery Bilateral   . Esophagogastroduodenoscopy (egd) with propofol N/A 12/06/2015    Procedure: ESOPHAGOGASTRODUODENOSCOPY (EGD) WITH PROPOFOL;  Surgeon: Wilford Corner, MD;  Location: [facility] ENDOSCOPY;  Service: Endoscopy;  Laterality: N/A;  . Colonoscopy with propofol N/A 12/06/2015    Procedure: COLONOSCOPY WITH PROPOFOL;  Surgeon: Wilford Corner, MD;  Location: [facility] ENDOSCOPY;  Service: Endoscopy;  Laterality: N/A;   Family History  Problem Relation Age of Onset  . Adopted: Yes  . Hypertension Mother    Social History  Substance Use Topics  . Smoking status: Current Every Day Smoker -- 0.25 packs/day for 44 years    Types: Cigarettes  . Smokeless tobacco: Never Used     Comment: 1/4  PPD or less  . Alcohol Use: No   OB History    No data available     Review of Systems  Constitutional: Positive for fever.  Gastrointestinal:  Positive for nausea, vomiting, abdominal pain and diarrhea.  All other systems reviewed and are negative.     Allergies  Keflex; Clindamycin/lincomycin; Latex; and Sulfa antibiotics  Home Medications   Prior to Admission medications   Medication Sig Start Date End Date Taking? Authorizing Provider  acetaminophen-codeine (TYLENOL #3) 300-30 MG tablet TAKE 1 TO 2 TABLETS BY MOUTH EVERY 6 HOURS AS NEEDED FOR MODERATE PAIN **MUST LAST 30 DAYS** 12/11/15   [name], MD  albuterol (PROVENTIL HFA;VENTOLIN HFA) 108 (90 BASE) MCG/ACT inhaler Inhale 2 puffs into the lungs once. Patient not taking: Reported on 01/13/2016 09/23/15   Merrily Pew, MD  amLODipine (NORVASC) 10 MG tablet TAKE 1 TABLET BY MOUTH EVERY DAY  10/23/15   Bartholomew Crews, MD  dicyclomine (BENTYL) 20 MG tablet Take 1 tablet (20 mg total) by mouth 4 (four) times daily. 12/18/15   [name], MD  feeding supplement, GLUCERNA SHAKE, (GLUCERNA SHAKE) LIQD Take 237 mLs by mouth daily as needed. Patient not taking: Reported on 01/13/2016 01/03/16   [name], MD  gabapentin (NEURONTIN) 300 MG capsule TAKE 1 CAPSULE BY MOUTH THREE TIMES A DAY 09/25/15   [name], MD  insulin NPH-regular Human (NOVOLIN 70/30) (70-30) 100 UNIT/ML injection Inject 20 Units into the skin 2 (two) times daily with a meal. 01/13/16   Francesca Oman, DO  loratadine (CLARITIN) 10 MG tablet Take 1 tablet (10 mg total) by mouth daily. Patient taking differently: Take 10 mg by mouth daily as needed for allergies or rhinitis.  08/19/15 08/18/16  [name], MD  losartan-hydrochlorothiazide (HYZAAR) 100-25 MG per tablet Take 1 tablet by mouth daily. 04/08/15   Jessee Avers, MD  Nutritional Supplements (JUVEN NUTRIVIGOR) PACK Take 1 packet by mouth daily. As directed by wound care nurse 02/12/16   [name], MD  omeprazole (PRILOSEC) 20 MG capsule Take 1 capsule (20 mg total) by mouth 2 (two) times daily before a meal. Patient not taking: Reported on 01/13/2016 12/06/15   Norval Gable, MD  pravastatin (PRAVACHOL) 40 MG tablet TAKE 1 TABLET BY MOUTH EVERY EVENING 01/20/16   [name], MD  rivaroxaban (XARELTO) 20 MG TABS tablet Take 1 tablet (20 mg total) by mouth daily with supper. 02/07/16   [name], MD  SANTYL ointment APPLY TO AFFECTED AREA DAILY 12/18/15   [name], MD  traZODone (DESYREL) 150 MG tablet TAKE 1 TABLET BY MOUTH EVERY NIGHT AT BEDTIME 12/11/15   [name], MD   BP 104/76 mmHg  Pulse 112  Temp(Src) 97.5 F (36.4 C) (Oral)  Resp 18  SpO2 96%   Physical Exam  Constitutional: She is oriented to person, place, and time. She appears well-developed and well-nourished. No distress.  HENT:  Head: Normocephalic and  atraumatic.  Right Ear: Hearing normal.  Left Ear: Hearing normal.  Nose: Nose normal.  Mouth/Throat: Oropharynx is clear and moist and mucous membranes are normal.  Eyes: Conjunctivae and EOM are normal. Pupils are equal, round, and reactive to light.  Neck: Normal range of motion. Neck supple.  Cardiovascular: Regular rhythm, S1 normal and S2 normal.  Exam reveals no gallop and no friction rub.   No murmur heard. Pulmonary/Chest: Effort normal and breath sounds normal. No respiratory distress. She exhibits no tenderness.  Abdominal: Soft. Normal appearance and bowel sounds are normal. She exhibits no distension. There is no hepatosplenomegaly. There is tenderness. There is no rebound, no guarding, no tenderness at McBurney's point and negative Murphy's sign. No hernia.  Diffuse abdominal tenderness.   Musculoskeletal: Normal range of motion.  Neurological: She is alert and oriented to person, place, and time. She has normal strength. No cranial nerve deficit or sensory deficit. Coordination normal. GCS eye subscore is 4. GCS verbal subscore is 5. GCS motor subscore is 6.  Skin: Skin is warm, dry and intact. No rash noted. No cyanosis.  Psychiatric: She has a normal mood and affect. Her speech is normal and behavior is normal. Thought content normal.  Nursing note and vitals reviewed.   ED Course  Procedures (including critical care time) DIAGNOSTIC STUDIES: Oxygen Saturation is 96% on RA, normal by my interpretation.    COORDINATION OF CARE: 1:54 AM-Discussed treatment plan with pt at bedside and pt agreed to plan.     Labs Review Labs Reviewed  COMPREHENSIVE METABOLIC PANEL - Abnormal; Notable for the following:    Potassium 3.4 (*)    Glucose, Bld 169 (*)    Creatinine, Ser 2.59 (*)    Albumin 2.5 (*)    ALT 11 (*)    GFR calc non Af Amer 18 (*)    GFR calc Af Amer 21 (*)    Anion gap 16 (*)    All other components within normal limits  CBC - Abnormal; Notable for the  following:    RBC 5.95 (*)    Hemoglobin 16.3 (*)    HCT 47.7 (*)    RDW 17.9 (*)    Platelets 410 (*)    All other components within normal limits  LIPASE, BLOOD  URINALYSIS, ROUTINE W REFLEX MICROSCOPIC (NOT AT [facility])    Imaging Review No results found. I have personally reviewed and evaluated these lab results as part of my medical decision-making.   EKG Interpretation None      MDM   Final diagnoses:  None  Abdominal pain Diarrhea AKI  Patient presents to the ER for evaluation of abdominal pain with nausea, vomiting and diarrhea. Patient reports that symptoms began ongoing for several days. She has not been able to eat because everything she eats comes to her as diarrhea. She thinks she had a fever several days ago. She has not had any since. Pain is diffuse and crampy in nature.  Mouth oral exam revealed diffuse tenderness without any guarding or rebound. Nothing to indicate acute surgical process. CT scan was performed to further evaluate and there is no acute abnormality noted. Patient still, however, feeling sick. She is nauseated despite fluids and Zofran. She is not tolerating liquids well enough to be discharged. She has a mild acute kidney injury secondary to the diarrhea and dehydration. Because of this, she will be admitted by the internal medicine teaching service.  I personally performed the services described in this documentation, which was scribed in my presence. The recorded information has been reviewed and is accurate.       Orpah Greek, MD 03/07/16 [id number]  Orpah Greek, MD 03/07/16 [phone]

## 2016-03-07 NOTE — Care Management Obs Status (Signed)
[facility] NOTIFICATION   Patient Details  Name: Joy Butler MRN: [id number] Date of Birth: Jul 20, 1951   Medicare Observation Status Notification Given:  Yes Patient in isolation room therefore form not signed but patient  verbalized understanding about Medicare observation letter and copy left at bedside. Patient admitted with abd pain; flu swab pending.   Guido Sander, RN 03/07/2016, 11:50 AM

## 2016-03-07 NOTE — H&P (Signed)
Date: 03/07/2016               Patient Name:  Joy Butler MRN: [id number]  DOB: 03/23/1951 Age / Sex: 65 y.o., female   PCP: Joy Estelle, MD         Medical Service: Internal Medicine Teaching Service         Attending Physician: Dr. Annia Belt, MD    First Contact: Dr. Zada Butler Pager: [phone]  Second Contact: Dr. Jacques Butler Pager: [phone]       After Hours (After 5p/  First Contact Pager: [phone]  weekends / holidays): Second Contact Pager: [phone]   Chief Complaint: Abdominal pain  History of Present Illness: 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 She says the pain began 3-4 days ago as an achy pain throughout her abdomen. She has associated nausea with one episode of emesis which contained rice she had eaten. She also reports loose bowel movements all day yesterday and today. She reports decreased oral intake due to pain and decreased appetite. She also says she has felt lightheaded, like she was going to pass out. She reports eating shrimp fried rice on Thursday before these symptoms started. She says her husband eats the same food as her and thinks he is also having some diarrhea. Patient also reports a cough with some green sputum production and generalized aches. She says her grandson and husband also have a cold with similar cough. She denies any fevers, chills, chest pain, palpitations, shortness of breath, rash, or recent travel.   Patient reports smoking about 6 cigarettes a day since she was young. She denies any alcohol or illicit drug use. Patient states she does not know her family's medical history. She reports a prior surgery to remove her gallbladder. Last year she had an unintentional weight loss of 50 lbs and similar  abdominal pain. 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  In the ED, vitals were: BP 104/76 mmHg  Pulse 112  Temp(Src) 97.5 F (36.4 C) (Oral)  Resp 18  SpO2 96% Lipase was WNL at 36, C diff PCR was negative.   Meds: Current Facility-Administered Medications  Medication Dose Route Frequency Provider Last Rate Last Dose  . 0.9 %  sodium chloride infusion   Intravenous Continuous Joy Loll, MD 125 mL/hr at 03/07/16 1152 1,000 mL at 03/07/16 1152  . acetaminophen (TYLENOL) tablet 650 mg  650 mg Oral Q6H PRN Joy Loll, MD       Or  . acetaminophen (TYLENOL) suppository 650 mg  650 mg Rectal Q6H PRN Joy Loll, MD      . acetaminophen-codeine (TYLENOL #3) 300-30 MG per tablet 1-2 tablet  1-2 tablet Oral Q6H PRN Joy Loll, MD   2 tablet at 03/07/16 1202  . collagenase (SANTYL) ointment   Topical Daily Joy Loll, MD      . insulin aspart (novoLOG) injection 0-5 Units  0-5 Units Subcutaneous QHS Joy Finders, MD      . insulin aspart (novoLOG) injection 0-9 Units  0-9 Units Subcutaneous TID WC Joy Finders, MD      . ondansetron (ZOFRAN) tablet 4 mg  4 mg Oral Q6H PRN Joy Butler  Joy Casino, MD       Or  . ondansetron [facility]) injection 4 mg  4 mg Intravenous Q6H PRN Joy Loll, MD      . potassium chloride 20 MEQ/15ML (10%) solution 40 mEq  40 mEq Oral Once Joy Finders, MD      . pravastatin (PRAVACHOL) tablet 40 mg  40 mg Oral QPM Joy Loll, MD      . rivaroxaban [facility]) tablet 20 mg  20 mg Oral Q supper Joy Loll, MD      . traZODone (DESYREL) tablet 150 mg  150 mg Oral QHS Joy Loll, MD        Allergies: Allergies as of 03/06/2016 - Review Complete 03/06/2016  Allergen Reaction Noted  . Keflex [cephalexin] Itching and Swelling 08/19/2012  . Clindamycin/lincomycin Itching 07/27/2014  . Latex Itching  09/01/2012  . Sulfa antibiotics Rash 08/16/2012   Past Medical History  Diagnosis Date  . Hypertension   . Pulmonary embolism ([place])     2011 treated at [facility] in [place].  Was on Coumadin for over a year.  no known family history  . UTI (urinary tract infection)   . High cholesterol   . 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 Depression   . Neuropathy ([facility])     feet  . Kidney stones   . Pneumonia     "several times" (07/26/2014)  . Type II diabetes mellitus ([place]) dx'd 2000  . Daily headache   . Migraine     "last one was in the 1990's" (07/26/2014)  . Arthritis     "knees" (07/26/2014)  . Chronic back pain   . Cataract of both eyes   . Hypertensive emergency 12/03/2015   Past Surgical History  Procedure Laterality Date  . Cholecystectomy      1980  . Esophagogastroduodenoscopy  08/19/2012    Procedure: ESOPHAGOGASTRODUODENOSCOPY (EGD);  Surgeon: Joy Butler., MD;  Location: [facility] ENDOSCOPY;  Service: Endoscopy;  Laterality: N/A;  . Cesarean section  1982  . Carpal tunnel release Bilateral   . Tonsillectomy    . Artery biopsy Right 05/09/2014    Procedure: BIOPSY TEMPORAL ARTERY;  Surgeon: Joy Posner, MD;  Location: [place];  Service: Vascular;  Laterality: Right;  . Appendectomy    . Vaginal hysterectomy    . Dilation and curettage of uterus  1982  . Cystoscopy w/ stone manipulation    . Breast biopsy Left   . Temporal artery biopsy / ligation Right 04/2014  . Eye surgery Bilateral   . Refractive surgery Bilateral   . Esophagogastroduodenoscopy (egd) with propofol N/A 12/06/2015    Procedure: ESOPHAGOGASTRODUODENOSCOPY (EGD) WITH PROPOFOL;  Surgeon: Joy Corner, MD;  Location: [facility] ENDOSCOPY;  Service: Endoscopy;  Laterality: N/A;  . Colonoscopy with propofol N/A 12/06/2015    Procedure: COLONOSCOPY WITH PROPOFOL;  Surgeon: Joy Corner, MD;  Location: [facility] ENDOSCOPY;  Service: Endoscopy;  Laterality: N/A;   Family History  Problem Relation Age of Onset    . Adopted: Yes  . Hypertension Mother    Social History   Social History  . Marital Status: Single    Spouse Name: N/A  . Number of Children: N/A  . Years of Education: N/A   Occupational History  . Not on file.   Social History Main Topics  . Smoking status: Current Every Day Smoker -- 0.25 packs/day for 44 years    Types: Cigarettes  . Smokeless  tobacco: Never Used     Comment: 1/4  PPD or less  . Alcohol Use: No  . Drug Use: No  . Sexual Activity: Yes   Other Topics Concern  . Not on file   Social History Narrative   Previously divorced now engaged with her partner of 4 years.  Has 6 grown children with 21 grandchildren.  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  Moved to [place] in August 2013 and was transiently homeless until first part of October 2013.      Review of Systems: Review of Systems  Constitutional: Positive for chills and malaise/fatigue. Negative for fever.  Eyes: Negative for photophobia.  Respiratory: Positive for cough and sputum production. Negative for shortness of breath and wheezing.   Cardiovascular: Negative for chest pain, palpitations, orthopnea and leg swelling.  Gastrointestinal: Positive for nausea, vomiting, abdominal pain and diarrhea. Negative for constipation, blood in stool and melena.  Genitourinary: Negative for dysuria and hematuria.  Musculoskeletal: Positive for myalgias.  Skin: Negative for rash.  Neurological: Positive for dizziness, weakness and headaches. Negative for tingling, focal weakness and loss of consciousness.  Psychiatric/Behavioral: Negative for substance abuse.     Physical Exam: Blood pressure 133/106, pulse 79, temperature 97.5 F (36.4 C), temperature source Oral, resp. rate 19, weight 227 lb 4.7 oz (103.1 kg), SpO2 100 %. Physical Exam  Constitutional: She is oriented to person, place, and time. She appears well-developed. No distress.  HENT:  Head:  Normocephalic and atraumatic.  Dry oropharynx, multiple teeth missing  Eyes: EOM are normal. Pupils are equal, round, and reactive to light.  Neck: Normal range of motion. Neck supple. No tracheal deviation present.  Cardiovascular: Normal rate and regular rhythm.  Exam reveals no friction rub.   No murmur heard. DP pulse +1 on right, +2 on left  Pulmonary/Chest: Effort normal and breath sounds normal. No respiratory distress. She has no wheezes. She has no rales. She exhibits no tenderness.  Abdominal: Soft. Bowel sounds are normal. She exhibits no distension.  Mild generalized tenderness  Musculoskeletal: Normal range of motion. She exhibits no edema or tenderness.  Lymphadenopathy:    She has no cervical adenopathy.  Neurological: She is alert and oriented to person, place, and time.  Skin: Skin is dry. She is not diaphoretic.  Two 2 cm left foot ulcers on plantar surface located at 1st MTP and heel. These are dry and with eschar appearance, no drainage, fluctuance noted. 2x2 cm healing burn wound on medial aspect of right leg several inches above the ankle.  Psychiatric: She has a normal mood and affect.     Lab results: Basic Metabolic Panel:  Recent Labs  03/07/16 0018  NA 143  K 3.4*  CL 103  CO2 24  GLUCOSE 169*  BUN 15  CREATININE 2.59*  CALCIUM 9.4   Liver Function Tests:  Recent Labs  03/07/16 0018  AST 20  ALT 11*  ALKPHOS 113  BILITOT 0.6  PROT 7.5  ALBUMIN 2.5*    Recent Labs  03/07/16 0018  LIPASE 36   No results for input(s): AMMONIA in the last 72 hours. CBC:  Recent Labs  03/07/16 0018  WBC 8.4  HGB 16.3*  HCT 47.7*  MCV 80.2  PLT 410*   Cardiac Enzymes: No results for input(s): CKTOTAL, CKMB, CKMBINDEX, TROPONINI in the last 72 hours. BNP: No results for input(s): PROBNP in the last 72 hours. D-Dimer: No results for input(s): DDIMER  in the last 72 hours. CBG:  Recent Labs  03/07/16 0905 03/07/16 1205  GLUCAP 109* 108*    Hemoglobin A1C: No results for input(s): HGBA1C in the last 72 hours. Fasting Lipid Panel: No results for input(s): CHOL, HDL, LDLCALC, TRIG, CHOLHDL, LDLDIRECT in the last 72 hours. Thyroid Function Tests: No results for input(s): TSH, T4TOTAL, FREET4, T3FREE, THYROIDAB in the last 72 hours. Anemia Panel: No results for input(s): VITAMINB12, FOLATE, FERRITIN, TIBC, IRON, RETICCTPCT in the last 72 hours. Coagulation: No results for input(s): LABPROT, INR in the last 72 hours. Urine Drug Screen: Drugs of Abuse     Component Value Date/Time   LABOPIA POSITIVE* 10/12/2012 0803   COCAINSCRNUR NONE DETECTED 10/12/2012 0803   LABBENZ NONE DETECTED 10/12/2012 0803   AMPHETMU NONE DETECTED 10/12/2012 0803   THCU NONE DETECTED 10/12/2012 0803   LABBARB NONE DETECTED 10/12/2012 0803    Alcohol Level: No results for input(s): ETH in the last 72 hours. Urinalysis: No results for input(s): COLORURINE, LABSPEC, PHURINE, GLUCOSEU, HGBUR, BILIRUBINUR, KETONESUR, PROTEINUR, UROBILINOGEN, NITRITE, LEUKOCYTESUR in the last 72 hours.  Invalid input(s): APPERANCEUR   Imaging results:  Ct Abdomen Pelvis Wo Contrast  03/07/2016  CLINICAL DATA:  Generalized abdominal pain EXAM: CT ABDOMEN AND PELVIS WITHOUT CONTRAST TECHNIQUE: Multidetector CT imaging of the abdomen and pelvis was performed following the standard protocol without IV contrast. COMPARISON:  12/03/2015 FINDINGS: Lower chest: Mild stable scarring in the bases, left greater than right. Hepatobiliary: Unremarkable unenhanced appearances of the liver and bile ducts. Prior cholecystectomy. Pancreas: Unremarkable unenhanced appearances of the pancreas. Spleen: Scarring, likely due to prior splenic infarctions. Unchanged. Adrenals/Urinary Tract: Unremarkable unenhanced appearances of the adrenals and kidneys except for extensive renal vascular calcifications. Collecting systems and ureters are unremarkable. Urinary bladder is unremarkable.  Stomach/Bowel: There are normal appearances of the stomach, small bowel and colon. The appendix is normal. Vascular/Lymphatic: The abdominal aorta is normal in caliber. There is mild atherosclerotic calcification. There is no adenopathy in the abdomen or pelvis. Reproductive: Uterus and ovaries are unremarkable. Other: No acute inflammatory changes are evident in the abdomen or pelvis. Musculoskeletal: Fat containing umbilical hernia. No significant skeletal lesion. IMPRESSION: No acute findings are evident in the abdomen or pelvis. Remote splenic infarctions. Fat containing umbilical hernia. No interval change from 12/03/2015. Electronically Signed   By: Andreas Newport M.D.   On: 03/07/2016 06:12   Dg Chest 2 View  03/07/2016  CLINICAL DATA:  Generalized abdominal pain, 3 days. EXAM: CHEST  2 VIEW COMPARISON:  09/23/2015 FINDINGS: There is mild unchanged hyperinflation. The lungs are clear. The pulmonary vasculature is normal. There is no pleural effusion. Heart size is normal. Hilar and mediastinal contours are unremarkable and unchanged. IMPRESSION: Mild unchanged hyperinflation.  No acute cardiopulmonary findings. Electronically Signed   By: Andreas Newport M.D.   On: 03/07/2016 03:01    Other results: EKG: Borderline sinus tachycardia, right axis deviation, normal r-wave progression.  Assessment & Plan by Problem: Active Problems:   Gastroenteritis  Ms. 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  Viral gastroenteritis: Patient's constellation of symptoms appear to be related to a viral gastroenteritis. She is afebrile, without leukocytosis, negative Lipase and C diff, and no acute pathology on CT abd/pelvis. There is temporal relation to eating  shrimp fried rice, which her husband also ate and reports similar but milder symptoms. She was given 0.5 L bolus of normal saline in the ED. Will keep overnight to hydrate with IVF and advance diet as tolerated. She also reports a cough, generalized aches, and sick contacts which may suggest flu. -NS @ 125 mL/hr -Check flu -Zofran 4 mg po or iv prn -Tylenol prn -Check BMP, CBC, Mg, Phos in am -Advance diet as tolerated  AKI: Baseline appears to be around 1.60-1.80. Her SCr is up at 2.59, likely 2/2 decreased oral intake/dehydration from volume loss. -IVF as above -Repeat BMP  T2DM: Patient reports taking Novolin 70/30 25 Units BID at home even though she was instructed to decrease to 20 Units BID on last clinic visit. Last Hgb A1C was 7.2 on 01/01/16. -SSI-S  HTN: Hold BP meds in setting of AKI and normotensive pressures -Hold home Losartan-HCTZ 100-25 mg qd -Hold home Amlodipine 10 mg qd  Hypokalemia: Mild, likely from diarrhea -replete as needed  Hx of Recurrent PE: Patient reports she is not taking Xarelto. Will need to continue indefinitely. -Xarelto 16 mg daily based on renal dosing  HLD: -Pravastatin 40 mg qhs  Diet: NPO w/ sips, advance as tolerated  DVT ppx: Xarelto  Code: FULL   Dispo: Disposition is deferred at this time, awaiting improvement of current medical problems. Anticipated discharge in approximately 1 day(s).   The patient does have a current PCP Joy Estelle, MD) and does need an [facility] hospital follow-up appointment after discharge.  The patient does not have transportation limitations that hinder transportation to clinic appointments.  Signed: Zada Finders, MD 03/07/2016, 12:55 PM

## 2016-03-07 NOTE — Progress Notes (Signed)
Report received from ED RN, we await pt. Arrival.  Alphonzo Lemmings, RN

## 2016-03-08 DIAGNOSIS — N179 Acute kidney failure, unspecified: Secondary | ICD-10-CM

## 2016-03-08 DIAGNOSIS — E876 Hypokalemia: Secondary | ICD-10-CM

## 2016-03-08 DIAGNOSIS — E785 Hyperlipidemia, unspecified: Secondary | ICD-10-CM

## 2016-03-08 DIAGNOSIS — R197 Diarrhea, unspecified: Secondary | ICD-10-CM | POA: Insufficient documentation

## 2016-03-08 DIAGNOSIS — A084 Viral intestinal infection, unspecified: Secondary | ICD-10-CM | POA: Diagnosis not present

## 2016-03-08 DIAGNOSIS — Z794 Long term (current) use of insulin: Secondary | ICD-10-CM

## 2016-03-08 DIAGNOSIS — E860 Dehydration: Secondary | ICD-10-CM | POA: Diagnosis not present

## 2016-03-08 DIAGNOSIS — E119 Type 2 diabetes mellitus without complications: Secondary | ICD-10-CM

## 2016-03-08 DIAGNOSIS — Z86711 Personal history of pulmonary embolism: Secondary | ICD-10-CM

## 2016-03-08 DIAGNOSIS — R1084 Generalized abdominal pain: Secondary | ICD-10-CM | POA: Insufficient documentation

## 2016-03-08 DIAGNOSIS — I10 Essential (primary) hypertension: Secondary | ICD-10-CM

## 2016-03-08 LAB — CBC
HEMATOCRIT: 40.2 % (ref 36.0–46.0)
Hemoglobin: 13.1 g/dL (ref 12.0–15.0)
MCH: 26.8 pg (ref 26.0–34.0)
MCHC: 32.6 g/dL (ref 30.0–36.0)
MCV: 82.2 fL (ref 78.0–100.0)
Platelets: 365 10*3/uL (ref 150–400)
RBC: 4.89 MIL/uL (ref 3.87–5.11)
RDW: 18.2 % — AB (ref 11.5–15.5)
WBC: 4.7 10*3/uL (ref 4.0–10.5)

## 2016-03-08 LAB — BASIC METABOLIC PANEL
Anion gap: 10 (ref 5–15)
BUN: 15 mg/dL (ref 6–20)
CALCIUM: 8.1 mg/dL — AB (ref 8.9–10.3)
CO2: 21 mmol/L — AB (ref 22–32)
Chloride: 110 mmol/L (ref 101–111)
Creatinine, Ser: 2.53 mg/dL — ABNORMAL HIGH (ref 0.44–1.00)
GFR calc Af Amer: 22 mL/min — ABNORMAL LOW (ref >60)
GFR, EST NON AFRICAN AMERICAN: 19 mL/min — AB (ref >60)
GLUCOSE: 81 mg/dL (ref 65–99)
POTASSIUM: 3.5 mmol/L (ref 3.5–5.1)
Sodium: 141 mmol/L (ref 135–145)

## 2016-03-08 LAB — GLUCOSE, CAPILLARY
GLUCOSE-CAPILLARY: 78 mg/dL (ref 65–99)
GLUCOSE-CAPILLARY: 158 mg/dL — AB (ref 65–99)
Glucose-Capillary: 77 mg/dL (ref 65–99)
Glucose-Capillary: 119 mg/dL — ABNORMAL HIGH (ref 65–99)

## 2016-03-08 LAB — MAGNESIUM: Magnesium: 2.1 mg/dL (ref 1.7–2.4)

## 2016-03-08 LAB — PHOSPHORUS: Phosphorus: 3.3 mg/dL (ref 2.5–4.6)

## 2016-03-08 MED ORDER — POTASSIUM CHLORIDE IN NACL 20-0.9 MEQ/L-% IV SOLN
INTRAVENOUS | Status: AC
  Administered 2016-03-08: 11:00:00 via INTRAVENOUS
  Filled 2016-03-08: qty 1000

## 2016-03-08 NOTE — Progress Notes (Signed)
Orthostatic VS checked at 0639  Lying BP 129/60 HR 62 Sitting BP 119/82 HR 82 Standing BP 99/63 HR 84 Standing 3 minutes BP 109/103

## 2016-03-08 NOTE — Progress Notes (Signed)
Patient asked the Nurse tech for pain medicine; when the nurse went to give the pain medicine the patient was sleeping.

## 2016-03-08 NOTE — Evaluation (Signed)
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 Pt admitted with gastroenteritis.  Clinical Impression  Pt admitted with above diagnosis. Pt currently with functional limitations due to the deficits listed below (see PT Problem List). On eval, pt required min assist for transfers and ambulation with RW. Pt noted to have LOB backwards with initial stance requiring min assist to stabilize. She reports multiple falls at home. Pt will benefit from skilled PT to increase their independence and safety with mobility to allow discharge to the venue listed below.       Follow Up Recommendations Home health PT;Supervision/Assistance - 24 hour    Equipment Recommendations  None recommended by PT    Recommendations for Other Services       Precautions / Restrictions Precautions Precautions: Fall Precaution Comments: multi falls at home      Mobility  Bed Mobility Overal bed mobility: Needs Assistance Bed Mobility: Supine to Sit     Supine to sit: Supervision;HOB elevated     General bed mobility comments: +rail  Transfers Overall transfer level: Needs assistance Equipment used: Rolling walker (2 wheeled) Transfers: Sit to/from [facility] Sit to Stand: Min assist Stand pivot transfers: Min assist       General transfer comment: Pt with LOB backward with initial stance.  Ambulation/Gait Ambulation/Gait assistance: Min assist Ambulation Distance (Feet): 10 Feet Assistive device: Rolling walker (2 wheeled) Gait Pattern/deviations: Step-through pattern;Decreased stride length Gait  velocity: decreased Gait velocity interpretation: Below normal speed for age/gender General Gait Details: Pt lightheaded with stance/ambulation.  Stairs            Wheelchair Mobility    Modified Rankin (Stroke Patients Only)       Balance Overall balance assessment: History of Falls                                           Pertinent Vitals/Pain Pain Assessment: No/denies pain    Home Living Family/patient expects to be discharged to:: Private residence Living Arrangements: Spouse/significant other Available Help at Discharge: Family;Available 24 hours/day Type of Home: House Home Access: Stairs to enter Entrance Stairs-Rails: Right;Left;Can reach both Entrance Stairs-Number of Steps: 3 Home Layout: One level Home Equipment: Cane - single point;Shower seat;Walker - 2 wheels      Prior Function Level of Independence: Independent with assistive device(s)         Comments: using RW, multi falls     Hand Dominance   Dominant Hand: Right    Extremity/Trunk Assessment   Upper Extremity Assessment: Defer to OT evaluation           Lower Extremity Assessment: Generalized weakness         Communication   Communication: No difficulties  Cognition Arousal/Alertness: Awake/alert Behavior During Therapy: WFL for tasks assessed/performed Overall Cognitive Status: Within Functional Limits for tasks assessed                      General Comments      Exercises  Assessment/Plan    PT Assessment Patient needs continued PT services  PT Diagnosis Difficulty walking;Generalized weakness   PT Problem List Decreased strength;Decreased activity tolerance;Decreased balance;Decreased mobility;Decreased safety awareness  PT Treatment Interventions DME instruction;Gait training;Stair training;Functional mobility training;Therapeutic activities;Therapeutic exercise;Patient/family education;Balance training   PT Goals (Current  goals can be found in the Care Plan section) Acute Rehab PT Goals Patient Stated Goal: get stronger PT Goal Formulation: With patient Time For Goal Achievement: 03/22/16 Potential to Achieve Goals: Good    Frequency Min 3X/week   Barriers to discharge        Co-evaluation               End of Session Equipment Utilized During Treatment: Gait belt Activity Tolerance: Patient limited by fatigue Patient left: in chair;with call bell/phone within reach;with chair alarm set Nurse Communication: Mobility status    Functional Assessment Tool Used: clinical judgement Functional Limitation: Mobility: Walking and moving around Mobility: Walking and Moving Around Current Status [phone]): At least 20 percent but less than 40 percent impaired, limited or restricted Mobility: Walking and Moving Around Goal Status [phone]): At least 1 percent but less than 20 percent impaired, limited or restricted    Time: 1107-1120 PT Time Calculation (min) (ACUTE ONLY): 13 min   Charges:   PT Evaluation $PT Eval Moderate Complexity: 1 Procedure     PT G Codes:   PT G-Codes **NOT FOR INPATIENT CLASS** Functional Assessment Tool Used: clinical judgement Functional Limitation: Mobility: Walking and moving around Mobility: Walking and Moving Around Current Status [id number]): At least 20 percent but less than 40 percent impaired, limited or restricted Mobility: Walking and Moving Around Goal Status [phone]): At least 1 percent but less than 20 percent impaired, limited or restricted    Lorriane Shire 03/08/2016, 11:34 AM

## 2016-03-08 NOTE — Progress Notes (Signed)
   Subjective: Patient says she has some continued abdominal pain which is improving. She denies any nausea, vomiting, or diarrhea overnight.  Objective: Vital signs in last 24 hours: Filed Vitals:   03/07/16 0901 03/07/16 2203 03/08/16 0508 03/08/16 0639  BP: 133/106 138/109 130/68 129/60  Pulse: 79 77 75 62  Temp: 97.5 F (36.4 C) 98.1 F (36.7 C) 99.4 F (37.4 C)   TempSrc: Oral Oral Oral   Resp: 19 18 16 16   Height: 5\' 9"  (1.753 m)     Weight: 227 lb 4.7 oz (103.1 kg)     SpO2: 100% 97% 96% 91%   Weight change:   Intake/Output Summary (Last 24 hours) at 03/08/16 1347 Last data filed at 03/08/16 1238  Gross per 24 hour  Intake 3509.16 ml  Output    551 ml  Net 2958.16 ml   General: resting in bed, no acute distress Cardiac: RRR, no rubs, murmurs or gallops Pulm: clear to auscultation bilaterally, moving normal volumes of air Abd: soft, minimal tenderness on palpation, nondistended, BS present Ext: no pedal edema   Assessment/Plan: Active Problems:   HTN (hypertension)   DM (diabetes mellitus), type 2 (HCC)   Recurrent pulmonary embolism (HCC)   Gastroenteritis  Viral gastroenteritis: Patient's constellation of symptoms appear to be related to a viral gastroenteritis. There is temporal relation to eating shrimp fried rice, which her husband also ate and reports similar but milder symptoms. Will continue with IVF hydration and advance diet as tolerated. -NS @ 75 mL/hr with KCl -Zofran 4 mg po or iv prn -Tylenol prn -Soft diet, advance as tolerated -PT recommending HHPT  AKI: Baseline appears to be around 1.60-1.80. Her SCr is 2.53, likely 2/2 decreased oral intake/dehydration from volume loss. -IVF as above -Repeat BMP  T2DM: Patient reports taking Novolin 70/30 25 Units BID at home even though she was instructed to decrease to 20 Units BID on last clinic visit. Last Hgb A1C was 7.2 on 01/01/16. -SSI-S  HTN: Hold BP meds in setting of AKI and normotensive  pressures -Hold home Losartan-HCTZ 100-25 mg qd -Hold home Amlodipine 10 mg qd   Hx of Recurrent PE: Patient reports she is not taking Xarelto. Will need to continue indefinitely. -Xarelto 15 mg daily based on renal dosing   Dispo: Disposition is deferred at this time, awaiting improvement of current medical problems.  Anticipated discharge in approximately 1 day(s).    Zada Finders, MD 03/08/2016, 1:47 PM

## 2016-03-09 ENCOUNTER — Ambulatory Visit: Payer: BLUE CROSS/BLUE SHIELD | Admitting: Internal Medicine

## 2016-03-09 DIAGNOSIS — N179 Acute kidney failure, unspecified: Secondary | ICD-10-CM | POA: Insufficient documentation

## 2016-03-09 DIAGNOSIS — L8990 Pressure ulcer of unspecified site, unspecified stage: Secondary | ICD-10-CM | POA: Insufficient documentation

## 2016-03-09 DIAGNOSIS — A084 Viral intestinal infection, unspecified: Secondary | ICD-10-CM | POA: Diagnosis not present

## 2016-03-09 LAB — BASIC METABOLIC PANEL
ANION GAP: 8 (ref 5–15)
BUN: 14 mg/dL (ref 6–20)
CHLORIDE: 113 mmol/L — AB (ref 101–111)
CO2: 21 mmol/L — ABNORMAL LOW (ref 22–32)
Calcium: 8.1 mg/dL — ABNORMAL LOW (ref 8.9–10.3)
Creatinine, Ser: 2.18 mg/dL — ABNORMAL HIGH (ref 0.44–1.00)
GFR calc Af Amer: 26 mL/min — ABNORMAL LOW (ref >60)
GFR, EST NON AFRICAN AMERICAN: 22 mL/min — AB (ref >60)
Glucose, Bld: 102 mg/dL — ABNORMAL HIGH (ref 65–99)
POTASSIUM: 3.5 mmol/L (ref 3.5–5.1)
SODIUM: 142 mmol/L (ref 135–145)

## 2016-03-09 LAB — GLUCOSE, CAPILLARY
GLUCOSE-CAPILLARY: 86 mg/dL (ref 65–99)
GLUCOSE-CAPILLARY: 113 mg/dL — AB (ref 65–99)

## 2016-03-09 MED ORDER — HYDROCODONE-ACETAMINOPHEN 5-325 MG PO TABS: 1.0000 | ORAL_TABLET | ORAL | Status: DC | PRN

## 2016-03-09 MED ORDER — RIVAROXABAN 15 MG PO TABS: 15.0000 mg | ORAL_TABLET | Freq: Every day | ORAL | Status: DC

## 2016-03-09 MED ORDER — DICYCLOMINE HCL 20 MG PO TABS
20.0000 mg | ORAL_TABLET | Freq: Four times a day (QID) | ORAL | Status: DC
  Administered 2016-03-09: 20 mg via ORAL
  Filled 2016-03-09 (×3): qty 1

## 2016-03-09 NOTE — Discharge Summary (Signed)
Name: Joy Butler MRN: [id number] DOB: 04-29-1951 65 y.o. PCP: [name], MD  Date of Admission: 03/07/2016  1:16 AM Date of Discharge: 03/09/2016 Attending Physician: Annia Belt, MD  Discharge Diagnosis: 1. Viral gastroenteritis Active Problems:   HTN (hypertension)   DM (diabetes mellitus), type 2 ([place])   Recurrent pulmonary embolism (HCC)   Gastroenteritis   Diarrhea   Generalized abdominal pain   Pressure ulcer   AKI (acute kidney injury) ([place])  Discharge Medications:   Medication List    TAKE these medications        acetaminophen-codeine 300-30 MG tablet  Commonly known as:  TYLENOL #3  TAKE 1 TO 2 TABLETS BY MOUTH EVERY 6 HOURS AS NEEDED FOR MODERATE PAIN **MUST LAST 30 DAYS**     amLODipine 10 MG tablet  Commonly known as:  NORVASC  TAKE 1 TABLET BY MOUTH EVERY DAY     dicyclomine 20 MG tablet  Commonly known as:  BENTYL  Take 1 tablet (20 mg total) by mouth 4 (four) times daily.     diphenoxylate-atropine 2.5-0.025 MG tablet  Commonly known as:  LOMOTIL  Take 1 tablet by mouth 4 (four) times daily as needed for diarrhea or loose stools.     gabapentin 300 MG capsule  Commonly known as:  NEURONTIN  TAKE 1 CAPSULE BY MOUTH THREE TIMES A DAY     HYDROcodone-acetaminophen 5-325 MG tablet  Commonly known as:  NORCO/VICODIN  Take 1 tablet by mouth every 4 (four) hours as needed for moderate pain.     insulin NPH-regular Human (70-30) 100 UNIT/ML injection  Commonly known as:  NOVOLIN 70/30  Inject 20 Units into the skin 2 (two) times daily with a meal.     loratadine 10 MG tablet  Commonly known as:  CLARITIN  Take 1 tablet (10 mg total) by mouth daily.     losartan-hydrochlorothiazide 100-25 MG tablet  Commonly known as:  HYZAAR  Take 1 tablet by mouth daily.     pravastatin 40 MG tablet  Commonly known as:  PRAVACHOL  TAKE 1 TABLET BY MOUTH EVERY EVENING     Rivaroxaban 15 MG Tabs tablet  Commonly known as:  XARELTO  Take 1  tablet (15 mg total) by mouth daily with supper.     SANTYL ointment  Generic drug:  collagenase  APPLY TO AFFECTED AREA DAILY     traZODone 150 MG tablet  Commonly known as:  DESYREL  TAKE 1 TABLET BY MOUTH EVERY NIGHT AT BEDTIME        Disposition and follow-up:   Joy Butler was discharged from [facility] in Stable condition.  At the hospital follow up visit please address:  1.   Viral gastroenteritis: Improvement in symptoms and oral intake/hydration.   Recurrent PE: Patient states she has not been taking home Xarelto. Please ensure that she continues to take on follow up. Her Xarelto was renally dosed due to AKI at 15 mg daily. Please assess renal function for any change in dosing.   2.  Labs / imaging needed at time of follow-up: BMP  3.  Pending labs/ test needing follow-up: none  Follow-up Appointments: Follow-up Information    Follow up with [facility].   Why:  3in1 to be delivered to room prior to discharge   Contact information:   [address] [place] [address] [phone]       Follow up with [facility].   Why:  PT. Will call in next 1-2 days to set up frst home appointment   Contact information:   [address] [place] [address] [phone]       Follow up with Sherin Quarry, MD. Go on 03/20/2016.   Specialty:  Internal Medicine   Why:  At 1:15 pm.   Contact information:   [place] [address] [phone]       Discharge Instructions: Discharge Instructions    Call MD for:  persistant dizziness or light-headedness    Complete by:  As directed      Call MD for:  persistant nausea and vomiting    Complete by:  As directed      Call MD for:  severe uncontrolled pain    Complete by:  As directed      Diet - low sodium heart healthy    Complete by:  As directed      Face-to-face encounter (required for Medicare/Medicaid patients)     Complete by:  As directed   I Zada Finders certify that this patient is under my care and that I, or a nurse practitioner or physician's assistant working with me, had a face-to-face encounter that meets the physician face-to-face encounter requirements with this patient on 03/09/2016. The encounter with the patient was in whole, or in part for the following medical condition(s) which is the primary reason for home health care (List medical condition): Weakness, balance difficulty  The encounter with the patient was in whole, or in part, for the following medical condition, which is the primary reason for home health care:  Weakness, balance difficulty  I certify that, based on my findings, the following services are medically necessary home health services:  Physical therapy  Reason for Medically Necessary Home Health Services:  Therapy- Personnel officer, Public librarian  My clinical findings support the need for the above services:  Unsafe ambulation due to balance issues  Further, I certify that my clinical findings support that this patient is homebound due to:  Unsafe ambulation due to balance issues     Home Health    Complete by:  As directed   To provide the following care/treatments:  PT     Increase activity slowly    Complete by:  As directed            Consultations:    Procedures Performed:  Ct Abdomen Pelvis Wo Contrast  03/07/2016  CLINICAL DATA:  Generalized abdominal pain EXAM: CT ABDOMEN AND PELVIS WITHOUT CONTRAST TECHNIQUE: Multidetector CT imaging of the abdomen and pelvis was performed following the standard protocol without IV contrast. COMPARISON:  12/03/2015 FINDINGS: Lower chest: Mild stable scarring in the bases, left greater than right. Hepatobiliary: Unremarkable unenhanced appearances of the liver and bile ducts. Prior cholecystectomy. Pancreas: Unremarkable unenhanced appearances of the pancreas. Spleen: Scarring, likely due to prior splenic  infarctions. Unchanged. Adrenals/Urinary Tract: Unremarkable unenhanced appearances of the adrenals and kidneys except for extensive renal vascular calcifications. Collecting systems and ureters are unremarkable. Urinary bladder is unremarkable. Stomach/Bowel: There are normal appearances of the stomach, small bowel and colon. The appendix is normal. Vascular/Lymphatic: The abdominal aorta is normal in caliber. There is mild atherosclerotic calcification. There is no adenopathy in the abdomen or pelvis. Reproductive: Uterus and ovaries are unremarkable. Other: No acute inflammatory changes are evident in the abdomen or pelvis. Musculoskeletal: Fat containing umbilical hernia. No significant skeletal lesion. IMPRESSION: No acute  findings are evident in the abdomen or pelvis. Remote splenic infarctions. Fat containing umbilical hernia. No interval change from 12/03/2015. Electronically Signed   By: Andreas Newport M.D.   On: 03/07/2016 06:12   Dg Chest 2 View  03/07/2016  CLINICAL DATA:  Generalized abdominal pain, 3 days. EXAM: CHEST  2 VIEW COMPARISON:  09/23/2015 FINDINGS: There is mild unchanged hyperinflation. The lungs are clear. The pulmonary vasculature is normal. There is no pleural effusion. Heart size is normal. Hilar and mediastinal contours are unremarkable and unchanged. IMPRESSION: Mild unchanged hyperinflation.  No acute cardiopulmonary findings. Electronically Signed   By: Andreas Newport M.D.   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 She says the pain began 3-4 days ago as an achy pain throughout her abdomen. She has associated nausea with one episode of emesis which contained rice she had  eaten. She also reports loose bowel movements all day yesterday and today. She reports decreased oral intake due to pain and decreased appetite. She also says she has felt lightheaded, like she was going to pass out. She reports eating shrimp fried rice on Thursday before these symptoms started. She says her husband eats the same food as her and thinks he is also having some diarrhea. Patient also reports a cough with some green sputum production and generalized aches. She says her grandson and husband also have a cold with similar cough. She denies any fevers, chills, chest pain, palpitations, shortness of breath, rash, or recent travel.   Patient reports smoking about 6 cigarettes a day since she was young. She denies any alcohol or illicit drug use. Patient states she does not know her family's medical history. She reports a prior surgery to remove her gallbladder. Last year she had an unintentional weight loss of 50 lbs and similar abdominal pain. 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  In the ED, vitals were: BP 104/76 mmHg  Pulse 112  Temp(Src) 97.5 F (36.4 C) (Oral)  Resp 18  SpO2 96% Lipase was WNL at 36, C diff PCR was negative.  Hospital Course by problem list: Active Problems:   HTN (hypertension)   DM (diabetes mellitus), type 2 (HCC)   Recurrent pulmonary embolism (HCC)   Gastroenteritis   Diarrhea   Generalized abdominal pain   Pressure ulcer   AKI (acute kidney injury) ([place])   Gastroenteritis: Patient supported with parenteral fluids, anti-emetics, and slow advancement of diet as tolerated. Her diarrhea, nausea, and vomiting improved prior to discharge.  AKI: Baseline appears to be around 1.60-1.80. Her SCr was elevated and trended down during hospital stay, likely 2/2 decreased oral intake/dehydration from volume loss.  T2DM: Patient  reported taking Novolin 70/30 25 Units BID at home although she was instructed to decrease to 20 Units BID on last clinic visit. Last Hgb A1C was 7.2 on 01/01/16. Blood glucose was controlled on SSI-S.  HTN: Home Losartan-HCTZ 100-25 mg qd and Amlodipine 10 mg qd were held in setting of AKI and normotensive pressures, resumed on discharge.    Discharge Vitals:   BP 139/70 mmHg  Pulse 77  Temp(Src) 98.9 F (  37.2 C) (Oral)  Resp 18  Ht 5\' 9"  (1.753 m)  Wt 227 lb 4.7 oz (103.1 kg)  BMI 33.55 kg/m2  SpO2 99%  Discharge Labs:  Results for orders placed or performed during the hospital encounter of 03/07/16 (from the past 24 hour(s))  Glucose, capillary     Status: Abnormal   Collection Time: 03/08/16  4:46 PM  Result Value Ref Range   Glucose-Capillary 119 (H) 65 - 99 mg/dL  Glucose, capillary     Status: Abnormal   Collection Time: 03/08/16 10:20 PM  Result Value Ref Range   Glucose-Capillary 158 (H) 65 - 99 mg/dL  Basic metabolic panel     Status: Abnormal   Collection Time: 03/09/16  7:01 AM  Result Value Ref Range   Sodium 142 135 - 145 mmol/L   Potassium 3.5 3.5 - 5.1 mmol/L   Chloride 113 (H) 101 - 111 mmol/L   CO2 21 (L) 22 - 32 mmol/L   Glucose, Bld 102 (H) 65 - 99 mg/dL   BUN 14 6 - 20 mg/dL   Creatinine, Ser 2.18 (H) 0.44 - 1.00 mg/dL   Calcium 8.1 (L) 8.9 - 10.3 mg/dL   GFR calc non Af Amer 22 (L) >60 mL/min   GFR calc Af Amer 26 (L) >60 mL/min   Anion gap 8 5 - 15  Glucose, capillary     Status: None   Collection Time: 03/09/16  8:04 AM  Result Value Ref Range   Glucose-Capillary 86 65 - 99 mg/dL  Glucose, capillary     Status: Abnormal   Collection Time: 03/09/16 11:55 AM  Result Value Ref Range   Glucose-Capillary 113 (H) 65 - 99 mg/dL    Signed: Zada Finders, MD 03/09/2016, 2:39 PM    Services Ordered on Discharge: [place] Ordered on Discharge: 3 in 1

## 2016-03-09 NOTE — Progress Notes (Signed)
RN went to check on patient and asked how she was feeling. Patient stated "I am okay. The doctor just came in here and told me he was sending me home." RN then asked patient how she was getting home and patient stated that her husband was coming to pick her up. Melanie, RN also stated that she had told patient in the morning that doctors may discharge her home today and patient said her husband will pick her up.   1410: RN went to tell patient that the doctor has gathered her papework for discharge and assess discharge transportation. Patient once again her stated her husband will get her, but "he does not get off work until [employer]" RN asked patient if she has other rides and patient stated no. RN asked if patient could use a bus pass or cab voucher and patient stated "You are just going to kick me out in the cold". RN asked if patient had a key to her house and patient stated "yes". RN then offered patient to go to discharge lounge and wait for husband there. Patient stated "I have no clothes", but shirt, coat and undewear found in patient belonging bags and patient offered paper pants that she can wear. Patient agitated saying "you are just trying to kick me out. I want to speak with your supervisor".   Smita, RN went and spoke with patient. Patient still agitated. Seth Bake, NT came to see patient and patient agreeable to leave.

## 2016-03-09 NOTE — Progress Notes (Signed)
Patient ID: Joy Butler, female   DOB: 01/14/1951, 65 y.o.   MRN: [id number] Medicine attending: I examined this patient this morning together with resident physician Dr. Zada Finders and I concur with his evaluation and management plan which we discussed together. She is slow to improve and still complains of diffuse abdominal pain. Abdomen remains diffusely tender on exam. She had one watery bowel movement in the last 24 hours. She is taking small amounts of by mouth solids. Serum creatinine remains elevated but improved compared with admission values. Known chronic renal insufficiency. We will continue to monitor over the course of the day. Advance diet as tolerated. Discharge when she is able to eat.

## 2016-03-09 NOTE — Progress Notes (Signed)
   Subjective: Patient has some continued abdominal pain. She reports one episode of diarrhea yesterday. She denies any nausea or vomiting. She says she has been able to eat fairly well with some aggravation of pain. Objective: Vital signs in last 24 hours: Filed Vitals:   03/08/16 0508 03/08/16 0639 03/08/16 2123 03/09/16 0615  BP: 130/68 129/60 144/72 139/70  Pulse: 75 62 78 77  Temp: 99.4 F (37.4 C)  98.6 F (37 C) 98.9 F (37.2 C)  TempSrc: Oral     Resp: 16 16 16 18   Height:      Weight:      SpO2: 96% 91% 100% 99%   Weight change:   Intake/Output Summary (Last 24 hours) at 03/09/16 1155 Last data filed at 03/09/16 1100  Gross per 24 hour  Intake 1207.5 ml  Output    700 ml  Net  507.5 ml   General: resting in a chair, no acute distress Cardiac: RRR, no rubs, murmurs or gallops Pulm: clear to auscultation bilaterally, moving normal volumes of air Abd: soft, minimal tenderness with deep palpation, nondistended, hypoactive bowel sounds Ext: no pedal edema   Assessment/Plan: Active Problems:   HTN (hypertension)   DM (diabetes mellitus), type 2 (HCC)   Recurrent pulmonary embolism (HCC)   Gastroenteritis   Diarrhea   Generalized abdominal pain   Pressure ulcer   AKI (acute kidney injury) ([place])  Viral gastroenteritis: Diarrhea improving. Still some continued abdominal pain. Patient worked with PT yesterday who recommend HHPT. Patient reassured that this may take several days to improve and encouraged to rest when going home with oral hydration. -Zofran 4 mg po or iv prn -Tylenol prn -Soft diet, advance as tolerated -Likely discharge to home today   AKI: Baseline appears to be around 1.60-1.80. Her SCr improving at 2.18, likely 2/2 decreased oral intake/dehydration from volume loss. -Oral hydration  T2DM: Patient reports taking Novolin 70/30 25 Units BID at home even though she was instructed to decrease to 20 Units BID on last clinic visit. Last Hgb A1C was  7.2 on 01/01/16. -SSI-S  HTN: Hold BP meds in setting of AKI and normotensive pressures -Hold home Losartan-HCTZ 100-25 mg qd -Hold home Amlodipine 10 mg qd   Hx of Recurrent PE: Patient reports she is not taking Xarelto. Will need to continue indefinitely. -Xarelto 15 mg daily based on renal dosing   Dispo: Disposition is deferred at this time, awaiting improvement of current medical problems.  Anticipated discharge in approximately 1 day(s).    Zada Finders, MD 03/09/2016, 11:55 AM

## 2016-03-09 NOTE — Care Management Note (Signed)
Case Management Note  Patient Details  Name: Joy Butler MRN: [id number] Date of Birth: Jul 28, 1951  Subjective/Objective:                 Spoke with patient over the phone. She states that she needs a 3in1, has a walker at home already. PT rec HH PT, patient chose [facility] as she had them a year ago.    Action/Plan:  Order placed for 3 in 1, referral made to deliver to room prior to dc. Referal made to [facility], awaiting MD order.  Expected Discharge Date:                  Expected Discharge Plan:  [place]  In-House Referral:     Discharge planning Services  CM Consult  Post Acute Care Choice:  Home Health, Durable Medical Equipment Choice offered to:  Patient  DME Arranged:  3-N-1 DME Agency:  [place]:  PT [place]:  [place]  Status of Service:  Completed, signed off  Medicare Important Message Given:    Date Medicare IM Given:    Medicare IM give by:    Date Additional Medicare IM Given:    Additional Medicare Important Message give by:     If discussed at [place] of Stay Meetings, dates discussed:    Additional Comments:  Carles Collet, RN 03/09/2016, 11:47 AM

## 2016-03-09 NOTE — Discharge Instructions (Signed)
Please rest for the next few days. Your stomach virus may take up to a week before you are at your baseline health. Please continue to eat and drink fluids as tolerated. Try to avoid sugary drinks like sodas, fruit juices, or sweet tea.  We will have physical therapists help you at home for a few days.   Viral Gastroenteritis Viral gastroenteritis is also known as stomach flu. This condition affects the stomach and intestinal tract. It can cause sudden diarrhea and vomiting. The illness typically lasts 3 to 8 days. Most people develop an immune response that eventually gets rid of the virus. While this natural response develops, the virus can make you quite ill. CAUSES  Many different viruses can cause gastroenteritis, such as rotavirus or noroviruses. You can catch one of these viruses by consuming contaminated food or water. You may also catch a virus by sharing utensils or other personal items with an infected person or by touching a contaminated surface. SYMPTOMS  The most common symptoms are diarrhea and vomiting. These problems can cause a severe loss of body fluids (dehydration) and a body salt (electrolyte) imbalance. Other symptoms may include:  Fever.  Headache.  Fatigue.  Abdominal pain. DIAGNOSIS  Your caregiver can usually diagnose viral gastroenteritis based on your symptoms and a physical exam. A stool sample may also be taken to test for the presence of viruses or other infections. TREATMENT  This illness typically goes away on its own. Treatments are aimed at rehydration. The most serious cases of viral gastroenteritis involve vomiting so severely that you are not able to keep fluids down. In these cases, fluids must be given through an intravenous line (IV). HOME CARE INSTRUCTIONS   Drink enough fluids to keep your urine clear or pale yellow. Drink small amounts of fluids frequently and increase the amounts as tolerated.  Ask your caregiver for specific rehydration  instructions.  Avoid:  Foods high in sugar.  Alcohol.  Carbonated drinks.  Tobacco.  Juice.  Caffeine drinks.  Extremely hot or cold fluids.  Fatty, greasy foods.  Too much intake of anything at one time.  Dairy products until 24 to 48 hours after diarrhea stops.  You may consume probiotics. Probiotics are active cultures of beneficial bacteria. They may lessen the amount and number of diarrheal stools in adults. Probiotics can be found in yogurt with active cultures and in supplements.  Wash your hands well to avoid spreading the virus.  Only take over-the-counter or prescription medicines for pain, discomfort, or fever as directed by your caregiver. Do not give aspirin to children. Antidiarrheal medicines are not recommended.  Ask your caregiver if you should continue to take your regular prescribed and over-the-counter medicines.  Keep all follow-up appointments as directed by your caregiver. SEEK IMMEDIATE MEDICAL CARE IF:   You are unable to keep fluids down.  You do not urinate at least once every 6 to 8 hours.  You develop shortness of breath.  You notice blood in your stool or vomit. This may look like coffee grounds.  You have abdominal pain that increases or is concentrated in one small area (localized).  You have persistent vomiting or diarrhea.  You have a fever.  The patient is a child younger than 3 months, and he or she has a fever.  The patient is a child older than 3 months, and he or she has a fever and persistent symptoms.  The patient is a child older than 3 months, and he or she  has a fever and symptoms suddenly get worse.  The patient is a baby, and he or she has no tears when crying. MAKE SURE YOU:   Understand these instructions.  Will watch your condition.  Will get help right away if you are not doing well or get worse.   This information is not intended to replace advice given to you by your health care provider. Make sure you  discuss any questions you have with your health care provider.   Document Released: 12/07/2005 Document Revised: 02/29/2012 Document Reviewed: 09/23/2011 Elsevier Interactive Patient Education [employer].

## 2016-03-20 ENCOUNTER — Encounter: Payer: Self-pay | Admitting: Internal Medicine

## 2016-03-20 ENCOUNTER — Ambulatory Visit (INDEPENDENT_AMBULATORY_CARE_PROVIDER_SITE_OTHER): Payer: Medicare Other | Admitting: Internal Medicine

## 2016-03-20 VITALS — BP 132/68 | HR 103 | Temp 98.5°F | Ht 69.0 in | Wt 225.3 lb

## 2016-03-20 DIAGNOSIS — L988 Other specified disorders of the skin and subcutaneous tissue: Secondary | ICD-10-CM | POA: Diagnosis not present

## 2016-03-20 DIAGNOSIS — M546 Pain in thoracic spine: Secondary | ICD-10-CM

## 2016-03-20 DIAGNOSIS — Z09 Encounter for follow-up examination after completed treatment for conditions other than malignant neoplasm: Secondary | ICD-10-CM | POA: Diagnosis not present

## 2016-03-20 DIAGNOSIS — K529 Noninfective gastroenteritis and colitis, unspecified: Secondary | ICD-10-CM

## 2016-03-20 DIAGNOSIS — N179 Acute kidney failure, unspecified: Secondary | ICD-10-CM

## 2016-03-20 DIAGNOSIS — W19XXXA Unspecified fall, initial encounter: Secondary | ICD-10-CM

## 2016-03-20 DIAGNOSIS — Z8719 Personal history of other diseases of the digestive system: Secondary | ICD-10-CM | POA: Diagnosis not present

## 2016-03-20 NOTE — Assessment & Plan Note (Signed)
Patient doing well post-discharge.  She denies fevers, chills, N/V, constipation, or diarrhea. She has no abdominal complaint.  A/P: Viral gastro, resolved.  Will recheck BMP to ensure AKI resolved/improving. - BMP

## 2016-03-20 NOTE — Assessment & Plan Note (Signed)
Patient complaining of constant, worsening back pain since a fall 1 month ago.  She cannot give any details surrounding the fall, and cannot say whether she landed on her front or back. However, since that fall, she has had upper back pain, which feels like stabbing pain without radiation.  She states the pain comes from the skin, not deeper like bone.  Her grandson first noticed the lesion on the skin, which has not changed since it was first seen.  She has been taking Tylenol with codeine 2 tabs q6h for the pain, which has not helped.  She has not been doing PT.    On exam, there is an irregular border verrucous appearing plaque.  <0.5cm ulceration within the plaque with fibrinous exudate.  Surrounding skin appears normal with ~4-5 cm radius of hardened, indurated skin surrounding central plaque. Patient's bra strap appears to intermittently come into contact with lesion.  A/P: Back pain of unclear etiology.  The plaque and surrounding induration is possibly resolving abscess/scar from injury sustained with the fall.  Patient denies deeper pain and CXR taking admission negative for acute fracture, only showing extensive DJD.  Will investigate with [place] to look for possible abscess.  If no abscess seen, recommend punch biopsy to evaluate for dermal process. - [place] thoracic soft tissue - RTC 1 week with possible punch biopsy

## 2016-03-20 NOTE — Progress Notes (Signed)
Patient ID: Joy Butler, female   DOB: [birth year], 65 y.o.   MRN: [id number]   Subjective:   Patient ID: Joy Butler female   DOB: 03-16-1951 65 y.o.   MRN: [id number]  HPI: Joy Butler is a 65 y.o. female with PMH as below, here for hospital f/u of gastroenteritis.  Please see Problem-Based charting for the status of the patient's chronic medical issues.     Past Medical History  Diagnosis Date  . Hypertension   . Pulmonary embolism ([place])     2011 treated at [facility] in [place].  Was on Coumadin for over a year.  no known family history  . UTI (urinary tract infection)   . High cholesterol   . 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 Depression   . Neuropathy ([facility])     feet  . Kidney stones   . Pneumonia     "several times" (07/26/2014)  . Type II diabetes mellitus ([place]) dx'd 2000  . Daily headache   . Migraine     "last one was in the 1990's" (07/26/2014)  . Arthritis     "knees" (07/26/2014)  . Chronic back pain   . Cataract of both eyes   . Hypertensive emergency 12/03/2015   Current Outpatient Prescriptions  Medication Sig Dispense Refill  . acetaminophen-codeine (TYLENOL #3) 300-30 MG tablet TAKE 1 TO 2 TABLETS BY MOUTH EVERY 6 HOURS AS NEEDED FOR MODERATE PAIN **MUST LAST 30 DAYS** 45 tablet 4  . amLODipine (NORVASC) 10 MG tablet TAKE 1 TABLET BY MOUTH EVERY DAY 30 tablet 11  . dicyclomine (BENTYL) 20 MG tablet Take 1 tablet (20 mg total) by mouth 4 (four) times daily. 120 tablet 3  . diphenoxylate-atropine (LOMOTIL) 2.5-0.025 MG tablet Take 1 tablet by mouth 4 (four) times daily as needed for diarrhea or loose stools. 30 tablet 0  . gabapentin (NEURONTIN) 300 MG capsule TAKE 1 CAPSULE BY MOUTH THREE TIMES A DAY 180 capsule 2  . HYDROcodone-acetaminophen (NORCO/VICODIN) 5-325 MG tablet Take 1 tablet by mouth every 4 (four) hours as needed for moderate pain. 10 tablet 0  . insulin NPH-regular Human (NOVOLIN 70/30) (70-30) 100 UNIT/ML injection Inject  20 Units into the skin 2 (two) times daily with a meal. (Patient taking differently: Inject 25 Units into the skin 2 (two) times daily with a meal. ) 30 mL 1  . loratadine (CLARITIN) 10 MG tablet Take 1 tablet (10 mg total) by mouth daily. (Patient taking differently: Take 10 mg by mouth daily as needed for allergies or rhinitis. ) 30 tablet 2  . losartan-hydrochlorothiazide (HYZAAR) 100-25 MG per tablet Take 1 tablet by mouth daily. 90 tablet 2  . pravastatin (PRAVACHOL) 40 MG tablet TAKE 1 TABLET BY MOUTH EVERY EVENING 90 tablet 1  . Rivaroxaban (XARELTO) 15 MG TABS tablet Take 1 tablet (15 mg total) by mouth daily with supper. 60 tablet 0  . SANTYL ointment APPLY TO AFFECTED AREA DAILY 90 g 4  . traZODone (DESYREL) 150 MG tablet TAKE 1 TABLET BY MOUTH EVERY NIGHT AT BEDTIME 30 tablet 4   No current facility-administered medications for this visit.   Family History  Problem Relation Age of Onset  . Adopted: Yes  . Hypertension Mother    Social History   Social History  . Marital Status: Single    Spouse Name: N/A  . Number of Children: N/A  . Years of Education: N/A   Social History Main  Topics  . Smoking status: Current Every Day Smoker -- 0.25 packs/day for 44 years    Types: Cigarettes  . Smokeless tobacco: Never Used     Comment:  5 CIGARETTES A DAY or less  . Alcohol Use: No  . Drug Use: No  . Sexual Activity: Yes   Other Topics Concern  . None   Social History Narrative   Previously divorced now engaged with her partner of 4 years.  Has 6 grown children with 21 grandchildren.  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  Moved to [place] in August 2013 and was transiently homeless until first part of October 2013.     Review of Systems: No fevers, chills, N/V, constipation, or diarrhea.  Endorses back pain. Objective:  Physical Exam: Filed Vitals:   03/20/16 1428  BP: 132/68  Pulse: 103  Temp: 98.5 F (36.9 C)  TempSrc: Oral   Height: 5\' 9"  (1.753 m)  Weight: 225 lb 4.8 oz (102.195 kg)  SpO2: 100%   Physical Exam  Constitutional: She is oriented to person, place, and time and well-developed, well-nourished, and in no distress. No distress.  HENT:  Head: Normocephalic and atraumatic.  Eyes: EOM are normal. No scleral icterus.  Neck: No tracheal deviation present.  Cardiovascular: Normal rate, regular rhythm and normal heart sounds.   Pulmonary/Chest: Effort normal and breath sounds normal. No stridor. No respiratory distress. She has no wheezes.  Abdominal: Soft. She exhibits no distension. There is no tenderness. There is no rebound and no guarding.  Neurological: She is alert and oriented to person, place, and time.  Skin: Skin is warm and dry. She is not diaphoretic.  Irregular border verrucous appearing plaque.  <0.5cm ulceration within the plaque with fibrinous exudate.  Surrounding skin appears normal with ~4-5 cm radius of hardened, indurated skin surrounding central plaque. Patient's bra strap appears to intermittently come into contact with lesion.  Psychiatric:  Affect flat.     Assessment & Plan:   Patient and case were discussed with Dr. Lynnae January.  Please refer to Problem Based charting for further documentation.

## 2016-03-20 NOTE — Patient Instructions (Signed)
1. Follow up for your Ultrasound of the your back. 2. Continue Tylenol 3 for the pain. 3. Continue to take your Xarelto.  Pulmonary Embolism A pulmonary embolism (PE) is a sudden blockage or decrease of blood flow in one lung or both lungs. Most blockages come from a blood clot that travels from the legs or the pelvis to the lungs. PE is a dangerous and potentially life-threatening condition if it is not treated right away. CAUSES A pulmonary embolism occurs most commonly when a blood clot travels from one of your veins to your lungs. Rarely, PE is caused by air, fat, amniotic fluid, or part of a tumor traveling through your veins to your lungs. RISK FACTORS A PE is more likely to develop in:  People who smoke.  People who areolder, especially over 55 years of age.  People who are overweight (obese).  People who sit or lie still for a long time, such as during long-distance travel (over 4 hours), bed rest, hospitalization, or during recovery from certain medical conditions like a stroke.  People who do not engage in much physical activity (sedentary lifestyle).  People who have chronic breathing disorders.  People whohave a personal or family history of blood clots or blood clotting disease.  People whohave peripheral vascular disease (PVD), diabetes, or some types of cancer.  People who haveheart disease, especially if the person had a recent heart attack or has congestive heart failure.  People who have neurological diseases that affect the legs (leg paresis).  People who have had a traumatic injury, such as breaking a hip or leg.  People whohave recently had major or lengthy surgery, especially on the hip, knee, or abdomen.  People who have hada central line placed inside a large vein.  People who takemedicines that contain the hormone estrogen. These include birth control pills and hormone replacement therapy.  Pregnancy or during childbirth or the postpartum  period. SIGNS AND SYMPTOMS  The symptoms of a PE usually start suddenly and include:  Shortness of breath while active or at rest.  Coughing or coughing up blood or blood-tinged mucus.  Chest pain that is often worse with deep breaths.  Rapid or irregular heartbeat.  Feeling light-headed or dizzy.  Fainting.  Feelinganxious.  Sweating. There may also be pain and swelling in a leg if that is where the blood clot started. These symptoms may represent a serious problem that is an emergency. Do not wait to see if the symptoms will go away. Get medical help right away. Call your local emergency services (911 in the [place] Do not drive yourself to the hospital. DIAGNOSIS Your health care provider will take a medical history and perform a physical exam. You may also have other tests, including:  Blood tests to assess the clotting properties of your blood, assess oxygen levels in your blood, and find blood clots.  Imaging tests, such as CT, ultrasound, MRI, X-ray, and other tests to see if you have clots anywhere in your body.  An electrocardiogram (ECG) to look for heart strain from blood clots in the lungs. TREATMENT The main goals of PE treatment are:  To stop a blood clot from growing larger.  To stop new blood clots from forming. The type of treatment that you receive depends on many factors, such as the cause of your PE, your risk for bleeding or developing more clots, and other medical conditions that you have. Sometimes, a combination of treatments is necessary. This condition may be treated with:  Medicines, including newer oral blood thinners (anticoagulants), warfarin, low molecular weight heparins, thrombolytics, or heparins.  Wearing compression stockings or using different types of devices.  Surgery (rare) to remove the blood clot or to place a filter in your abdomen to stop the blood clot from traveling to your lungs. Treatments for a PE are often divided into  immediate treatment, long-term treatment (up to 3 months after PE), and extended treatment (more than 3 months after PE). Your treatment may continue for several months. This is called maintenance therapy, and it is used to prevent the forming of new blood clots. You can work with your health care provider to choose the treatment program that is best for you. What are anticoagulants? Anticoagulants are medicines that treat PEs. They can stop current blood clots from growing and stop new clots from forming. They cannot dissolve existing clots. Your body dissolves clots by itself over time. Anticoagulants are given by mouth, by injection, or through an IV tube. What are thrombolytics? Thrombolytics are clot-dissolving medicines that are used to dissolve a PE. They carry a high risk of bleeding, so they tend to be used only in severe cases or if you have very low blood pressure. HOME CARE INSTRUCTIONS If you are taking a newer oral anticoagulant:  Take the medicine every single day at the same time each day.  Understand what foods and drugs interact with this medicine.  Understand that there are no regular blood tests required when using this medicine.  Understandthe side effects of this medicine, including excessive bruising or bleeding. Ask your health care provider or pharmacist about other possible side effects. If you are taking warfarin:  Understand how to take warfarin and know which foods can affect how warfarin works in Veterinary surgeon.  Understand that it is dangerous to taketoo much or too little warfarin. Too much warfarin increases the risk of bleeding. Too little warfarin continues to allow the risk for blood clots.  Follow your PT and INR blood testing schedule. The PT and INR results allow your health care provider to adjust your dose of warfarin. It is very important that you have your PT and INR tested as often as told by your health care provider.  Avoid major changes in your  diet, or tell your health care provider before you change your diet. Arrange a visit with a registered dietitian to answer your questions. Many foods, especially foods that are high in vitamin K, can interfere with warfarin and affect the PT and INR results. Eat a consistent amount of foods that are high in vitamin K, such as:  Spinach, kale, broccoli, cabbage, collard greens, turnip greens, Brussels sprouts, peas, cauliflower, seaweed, and parsley.  Beef liver and pork liver.  Green tea.  Soybean oil.  Tell your health care provider about any and all medicines, vitamins, and supplements that you take, including aspirin and other over-the-counter anti-inflammatory medicines. Be especially cautious with aspirin and anti-inflammatory medicines. Do not take those before you ask your health care provider if it is safe to do so. This is important because many medicines can interfere with warfarin and affect the PT and INR results.  Do not start or stop taking any over-the-counter or prescription medicine unless your health care provider or pharmacist tells you to do so. If you take warfarin, you will also need to do these things:  Hold pressure over cuts for longer than usual.  Tell your dentist and other health care providers that you are taking warfarin  before you have any procedures in which bleeding may occur.  Avoid alcohol or drink very small amounts. Tell your health care provider if you change your alcohol intake.  Do not use tobacco products, including cigarettes, chewing tobacco, and e-cigarettes. If you need help quitting, ask your health care provider.  Avoid contact sports. General Instructions  Take over-the-counter and prescription medicines only as told by your health care provider. Anticoagulant medicines can have side effects, including easy bruising and difficulty stopping bleeding. If you are prescribed an anticoagulant, you will also need to do these things:  Hold  pressure over cuts for longer than usual.  Tell your dentist and other health care providers that you are taking anticoagulants before you have any procedures in which bleeding may occur.  Avoid contact sports.  Wear a medical alert bracelet or carry a medical alert card that says you have had a PE.  Ask your health care provider how soon you can go back to your normal activities. Stay active to prevent new blood clots from forming.  Make sure to exercise while traveling or when you have been sitting or standing for a long period of time. It is very important to exercise. Exercise your legs by walking or by tightening and relaxing your leg muscles often. Take frequent walks.  Wear compression stockings as told by your health care provider to help prevent more blood clots from forming.  Do not use tobacco products, including cigarettes, chewing tobacco, and e-cigarettes. If you need help quitting, ask your health care provider.  Keep all follow-up appointments with your health care provider. This is important. PREVENTION Take these actions to decrease your risk of developing another PE:  Exercise regularly. For at least 30 minutes every day, engage in:  Activity that involves moving your arms and legs.  Activity that encourages good blood flow through your body by increasing your heart rate.  Exercise your arms and legs every hour during long-distance travel (over 4 hours). Drink plenty of water and avoid drinking alcohol while traveling.  Avoid sitting or lying in bed for long periods of time without moving your legs.  Maintain a weight that is appropriate for your height. Ask your health care provider what weight is healthy for you.  If you are a woman who is over 14 years of age, avoid unnecessary use of medicines that contain estrogen. These include birth control pills.  Do not smoke, especially if you take estrogen medicines. If you need help quitting, ask your health care  provider.  If you are at very high risk for PE, wear compression stockings.  If you recently had a PE, have regularly scheduled ultrasound testing on your legs to check for new blood clots. If you are hospitalized, prevention measures may include:  Early walking after surgery, as soon as your health care provider says that it is safe.  Receiving anticoagulants to prevent blood clots. If you cannot take anticoagulants, other options may be available, such as wearing compression stockings or using different types of devices. SEEK IMMEDIATE MEDICAL CARE IF:  You have new or increased pain, swelling, or redness in an arm or leg.  You have numbness or tingling in an arm or leg.  You have shortness of breath while active or at rest.  You have chest pain.  You have a rapid or irregular heartbeat.  You feel light-headed or dizzy.  You cough up blood.  You notice blood in your vomit, bowel movement, or urine.  You  have a fever. These symptoms may represent a serious problem that is an emergency. Do not wait to see if the symptoms will go away. Get medical help right away. Call your local emergency services (911 in the [place] Do not drive yourself to the hospital.   This information is not intended to replace advice given to you by your health care provider. Make sure you discuss any questions you have with your health care provider.   Document Released: 12/04/2000 Document Revised: 08/28/2015 Document Reviewed: 04/03/2015 Elsevier Interactive Patient Education [employer].

## 2016-03-21 LAB — BMP8+ANION GAP
ANION GAP: 21 mmol/L — AB (ref 10.0–18.0)
BUN/Creatinine Ratio: 10 — ABNORMAL LOW (ref 11–26)
BUN: 19 mg/dL (ref 8–27)
CO2: 17 mmol/L — ABNORMAL LOW (ref 18–29)
CREATININE: 1.83 mg/dL — AB (ref 0.57–1.00)
Calcium: 8.4 mg/dL — ABNORMAL LOW (ref 8.7–10.3)
Chloride: 103 mmol/L (ref 96–106)
GFR, EST AFRICAN AMERICAN: 33 mL/min/{1.73_m2} — AB (ref >59)
GFR, EST NON AFRICAN AMERICAN: 28 mL/min/{1.73_m2} — AB (ref >59)
Glucose: 68 mg/dL (ref 65–99)
Potassium: 4.2 mmol/L (ref 3.5–5.2)
Sodium: 141 mmol/L (ref 134–144)

## 2016-03-24 ENCOUNTER — Other Ambulatory Visit: Payer: Self-pay | Admitting: Internal Medicine

## 2016-03-24 NOTE — Progress Notes (Signed)
Internal Medicine Clinic Attending  I saw and evaluated the patient.  I personally confirmed the key portions of the history and exam documented by Dr. Taylor and I reviewed pertinent patient test results.  The assessment, diagnosis, and plan were formulated together and I agree with the documentation in the resident's note.  

## 2016-03-27 ENCOUNTER — Inpatient Hospital Stay (HOSPITAL_COMMUNITY): Payer: Medicare Other

## 2016-03-27 ENCOUNTER — Inpatient Hospital Stay (HOSPITAL_COMMUNITY)
Admission: EM | Admit: 2016-03-27 | Discharge: 2016-04-01 | DRG: 571 | Disposition: A | Discharge status: Home Health | Payer: Medicare Other | Attending: Oncology | Admitting: Oncology

## 2016-03-27 ENCOUNTER — Ambulatory Visit: Payer: Medicare Other | Admitting: Internal Medicine

## 2016-03-27 ENCOUNTER — Encounter (HOSPITAL_COMMUNITY): Payer: Self-pay | Admitting: *Deleted

## 2016-03-27 ENCOUNTER — Emergency Department (HOSPITAL_COMMUNITY): Payer: Medicare Other

## 2016-03-27 ENCOUNTER — Encounter: Payer: Self-pay | Admitting: Internal Medicine

## 2016-03-27 DIAGNOSIS — N179 Acute kidney failure, unspecified: Secondary | ICD-10-CM | POA: Diagnosis present

## 2016-03-27 DIAGNOSIS — Z881 Allergy status to other antibiotic agents status: Secondary | ICD-10-CM | POA: Diagnosis not present

## 2016-03-27 DIAGNOSIS — M4316 Spondylolisthesis, lumbar region: Secondary | ICD-10-CM | POA: Diagnosis present

## 2016-03-27 DIAGNOSIS — E669 Obesity, unspecified: Secondary | ICD-10-CM | POA: Diagnosis present

## 2016-03-27 DIAGNOSIS — Z9889 Other specified postprocedural states: Secondary | ICD-10-CM | POA: Diagnosis not present

## 2016-03-27 DIAGNOSIS — B372 Candidiasis of skin and nail: Secondary | ICD-10-CM | POA: Diagnosis present

## 2016-03-27 DIAGNOSIS — E1140 Type 2 diabetes mellitus with diabetic neuropathy, unspecified: Secondary | ICD-10-CM | POA: Diagnosis present

## 2016-03-27 DIAGNOSIS — L02212 Cutaneous abscess of back [any part, except buttock]: Secondary | ICD-10-CM | POA: Diagnosis present

## 2016-03-27 DIAGNOSIS — R5381 Other malaise: Secondary | ICD-10-CM | POA: Diagnosis present

## 2016-03-27 DIAGNOSIS — Z7901 Long term (current) use of anticoagulants: Secondary | ICD-10-CM | POA: Diagnosis not present

## 2016-03-27 DIAGNOSIS — I129 Hypertensive chronic kidney disease with stage 1 through stage 4 chronic kidney disease, or unspecified chronic kidney disease: Secondary | ICD-10-CM | POA: Diagnosis present

## 2016-03-27 DIAGNOSIS — M47816 Spondylosis without myelopathy or radiculopathy, lumbar region: Secondary | ICD-10-CM | POA: Insufficient documentation

## 2016-03-27 DIAGNOSIS — Z86711 Personal history of pulmonary embolism: Secondary | ICD-10-CM | POA: Diagnosis not present

## 2016-03-27 DIAGNOSIS — E118 Type 2 diabetes mellitus with unspecified complications: Secondary | ICD-10-CM

## 2016-03-27 DIAGNOSIS — Z794 Long term (current) use of insulin: Secondary | ICD-10-CM | POA: Diagnosis not present

## 2016-03-27 DIAGNOSIS — F329 Major depressive disorder, single episode, unspecified: Secondary | ICD-10-CM | POA: Diagnosis present

## 2016-03-27 DIAGNOSIS — B9561 Methicillin susceptible Staphylococcus aureus infection as the cause of diseases classified elsewhere: Secondary | ICD-10-CM | POA: Diagnosis not present

## 2016-03-27 DIAGNOSIS — Z79899 Other long term (current) drug therapy: Secondary | ICD-10-CM | POA: Diagnosis not present

## 2016-03-27 DIAGNOSIS — Z6832 Body mass index (BMI) 32.0-32.9, adult: Secondary | ICD-10-CM | POA: Diagnosis not present

## 2016-03-27 DIAGNOSIS — B9562 Methicillin resistant Staphylococcus aureus infection as the cause of diseases classified elsewhere: Secondary | ICD-10-CM | POA: Diagnosis present

## 2016-03-27 DIAGNOSIS — L03312 Cellulitis of back [any part except buttock]: Secondary | ICD-10-CM | POA: Diagnosis not present

## 2016-03-27 DIAGNOSIS — G8929 Other chronic pain: Secondary | ICD-10-CM | POA: Diagnosis present

## 2016-03-27 DIAGNOSIS — G4700 Insomnia, unspecified: Secondary | ICD-10-CM | POA: Diagnosis present

## 2016-03-27 DIAGNOSIS — E7800 Pure hypercholesterolemia, unspecified: Secondary | ICD-10-CM | POA: Diagnosis present

## 2016-03-27 DIAGNOSIS — E11628 Type 2 diabetes mellitus with other skin complications: Secondary | ICD-10-CM | POA: Diagnosis not present

## 2016-03-27 DIAGNOSIS — R748 Abnormal levels of other serum enzymes: Secondary | ICD-10-CM | POA: Diagnosis present

## 2016-03-27 DIAGNOSIS — F17210 Nicotine dependence, cigarettes, uncomplicated: Secondary | ICD-10-CM | POA: Diagnosis present

## 2016-03-27 DIAGNOSIS — E1122 Type 2 diabetes mellitus with diabetic chronic kidney disease: Secondary | ICD-10-CM | POA: Diagnosis present

## 2016-03-27 DIAGNOSIS — R29898 Other symptoms and signs involving the musculoskeletal system: Secondary | ICD-10-CM | POA: Insufficient documentation

## 2016-03-27 DIAGNOSIS — B379 Candidiasis, unspecified: Secondary | ICD-10-CM

## 2016-03-27 DIAGNOSIS — I2690 Septic pulmonary embolism without acute cor pulmonale: Secondary | ICD-10-CM | POA: Diagnosis not present

## 2016-03-27 DIAGNOSIS — M549 Dorsalgia, unspecified: Secondary | ICD-10-CM

## 2016-03-27 DIAGNOSIS — N183 Chronic kidney disease, stage 3 (moderate): Secondary | ICD-10-CM | POA: Diagnosis present

## 2016-03-27 DIAGNOSIS — L0291 Cutaneous abscess, unspecified: Secondary | ICD-10-CM | POA: Diagnosis present

## 2016-03-27 DIAGNOSIS — M4806 Spinal stenosis, lumbar region: Secondary | ICD-10-CM | POA: Diagnosis present

## 2016-03-27 DIAGNOSIS — D72829 Elevated white blood cell count, unspecified: Secondary | ICD-10-CM | POA: Diagnosis not present

## 2016-03-27 DIAGNOSIS — N189 Chronic kidney disease, unspecified: Secondary | ICD-10-CM | POA: Insufficient documentation

## 2016-03-27 LAB — I-STAT BETA HCG BLOOD, ED (MC, WL, AP ONLY): I-stat hCG, quantitative: <5 m[IU]/mL (ref <5)

## 2016-03-27 LAB — CBC WITH DIFFERENTIAL/PLATELET
BASOS ABS: 0.2 10*3/uL — AB (ref 0.0–0.1)
Basophils Relative: 1 %
EOS PCT: 2 %
Eosinophils Absolute: 0.3 10*3/uL (ref 0.0–0.7)
HEMATOCRIT: 34 % — AB (ref 36.0–46.0)
HEMOGLOBIN: 11 g/dL — AB (ref 12.0–15.0)
LYMPHS ABS: 1.8 10*3/uL (ref 0.7–4.0)
LYMPHS PCT: 11 %
MCH: 25.8 pg — ABNORMAL LOW (ref 26.0–34.0)
MCHC: 32.4 g/dL (ref 30.0–36.0)
MCV: 79.8 fL (ref 78.0–100.0)
MONOS PCT: 6 %
Monocytes Absolute: 1 10*3/uL (ref 0.1–1.0)
NEUTROS PCT: 80 %
Neutro Abs: 13.4 10*3/uL — ABNORMAL HIGH (ref 1.7–7.7)
Platelets: 527 10*3/uL — ABNORMAL HIGH (ref 150–400)
RBC: 4.26 MIL/uL (ref 3.87–5.11)
RDW: 18.3 % — ABNORMAL HIGH (ref 11.5–15.5)
WBC: 16.7 10*3/uL — AB (ref 4.0–10.5)

## 2016-03-27 LAB — COMPREHENSIVE METABOLIC PANEL
ALBUMIN: 1.6 g/dL — AB (ref 3.5–5.0)
ALT: 14 U/L (ref 14–54)
ANION GAP: 12 (ref 5–15)
AST: 18 U/L (ref 15–41)
Alkaline Phosphatase: 204 U/L — ABNORMAL HIGH (ref 38–126)
BILIRUBIN TOTAL: 0.6 mg/dL (ref 0.3–1.2)
BUN: 22 mg/dL — AB (ref 6–20)
CHLORIDE: 109 mmol/L (ref 101–111)
CO2: 19 mmol/L — AB (ref 22–32)
Calcium: 8.5 mg/dL — ABNORMAL LOW (ref 8.9–10.3)
Creatinine, Ser: 1.95 mg/dL — ABNORMAL HIGH (ref 0.44–1.00)
GFR calc Af Amer: 30 mL/min — ABNORMAL LOW (ref >60)
GFR calc non Af Amer: 26 mL/min — ABNORMAL LOW (ref >60)
GLUCOSE: 157 mg/dL — AB (ref 65–99)
POTASSIUM: 3 mmol/L — AB (ref 3.5–5.1)
SODIUM: 140 mmol/L (ref 135–145)
Total Protein: 6.6 g/dL (ref 6.5–8.1)

## 2016-03-27 LAB — CBG MONITORING, ED
GLUCOSE-CAPILLARY: 109 mg/dL — AB (ref 65–99)
Glucose-Capillary: 159 mg/dL — ABNORMAL HIGH (ref 65–99)

## 2016-03-27 LAB — URINE MICROSCOPIC-ADD ON

## 2016-03-27 LAB — URINALYSIS, ROUTINE W REFLEX MICROSCOPIC
Glucose, UA: 250 mg/dL — AB
Ketones, ur: NEGATIVE mg/dL
Leukocytes, UA: NEGATIVE
NITRITE: NEGATIVE
Protein, ur: >300 mg/dL — AB
SPECIFIC GRAVITY, URINE: 1.025 (ref 1.005–1.030)
pH: 6 (ref 5.0–8.0)

## 2016-03-27 LAB — I-STAT CG4 LACTIC ACID, ED
LACTIC ACID, VENOUS: 1.91 mmol/L (ref 0.5–2.0)
Lactic Acid, Venous: 2.41 mmol/L (ref 0.5–2.0)

## 2016-03-27 LAB — LACTIC ACID, PLASMA: LACTIC ACID, VENOUS: 1.8 mmol/L (ref 0.5–2.0)

## 2016-03-27 LAB — GAMMA GT: GGT: 89 U/L — ABNORMAL HIGH (ref 7–50)

## 2016-03-27 MED ORDER — VANCOMYCIN HCL IN DEXTROSE 1-5 GM/200ML-% IV SOLN
1000.0000 mg | Freq: Once | INTRAVENOUS | Status: AC
  Administered 2016-03-27: 1000 mg via INTRAVENOUS
  Filled 2016-03-27: qty 200

## 2016-03-27 MED ORDER — NYSTATIN 100000 UNIT/GM EX POWD
Freq: Three times a day (TID) | CUTANEOUS | Status: DC
  Administered 2016-03-28 – 2016-03-29 (×4): via TOPICAL
  Administered 2016-03-29: 1 via TOPICAL
  Administered 2016-03-29 – 2016-03-30 (×3): via TOPICAL
  Administered 2016-03-30: 1 g via TOPICAL
  Administered 2016-03-30 – 2016-04-01 (×6): via TOPICAL
  Filled 2016-03-27: qty 15

## 2016-03-27 MED ORDER — TRAZODONE HCL 50 MG PO TABS
150.0000 mg | ORAL_TABLET | Freq: Every evening | ORAL | Status: DC | PRN
  Administered 2016-03-30: 150 mg via ORAL
  Filled 2016-03-27: qty 1

## 2016-03-27 MED ORDER — AMLODIPINE BESYLATE 10 MG PO TABS
10.0000 mg | ORAL_TABLET | Freq: Every day | ORAL | Status: DC
  Administered 2016-03-29 – 2016-04-01 (×4): 10 mg via ORAL
  Filled 2016-03-27 (×4): qty 1

## 2016-03-27 MED ORDER — INSULIN ASPART 100 UNIT/ML ~~LOC~~ SOLN
0.0000 [IU] | Freq: Three times a day (TID) | SUBCUTANEOUS | Status: DC
  Administered 2016-03-29 – 2016-04-01 (×3): 2 [IU] via SUBCUTANEOUS

## 2016-03-27 MED ORDER — GABAPENTIN 300 MG PO CAPS
300.0000 mg | ORAL_CAPSULE | Freq: Three times a day (TID) | ORAL | Status: DC
  Administered 2016-03-27 – 2016-03-31 (×10): 300 mg via ORAL
  Filled 2016-03-27 (×10): qty 1

## 2016-03-27 MED ORDER — POTASSIUM CHLORIDE IN NACL 20-0.9 MEQ/L-% IV SOLN
INTRAVENOUS | Status: DC
  Administered 2016-03-27: 23:00:00 via INTRAVENOUS
  Filled 2016-03-27: qty 1000

## 2016-03-27 MED ORDER — DEXTROSE 5 % IV SOLN
1.0000 g | Freq: Three times a day (TID) | INTRAVENOUS | Status: DC
  Administered 2016-03-27: 1 g via INTRAVENOUS
  Filled 2016-03-27 (×2): qty 1

## 2016-03-27 MED ORDER — INSULIN ASPART 100 UNIT/ML ~~LOC~~ SOLN: 0.0000 [IU] | Freq: Every day | SUBCUTANEOUS | Status: DC

## 2016-03-27 MED ORDER — ACETAMINOPHEN 650 MG RE SUPP: 650.0000 mg | Freq: Four times a day (QID) | RECTAL | Status: DC | PRN

## 2016-03-27 MED ORDER — METRONIDAZOLE IN NACL 5-0.79 MG/ML-% IV SOLN
500.0000 mg | Freq: Once | INTRAVENOUS | Status: AC
  Administered 2016-03-27: 500 mg via INTRAVENOUS
  Filled 2016-03-27: qty 100

## 2016-03-27 MED ORDER — POTASSIUM CHLORIDE CRYS ER 20 MEQ PO TBCR
20.0000 meq | EXTENDED_RELEASE_TABLET | Freq: Once | ORAL | Status: AC
Start: 2016-03-27 — End: 2016-03-27
  Administered 2016-03-27: 20 meq via ORAL
  Filled 2016-03-27: qty 1

## 2016-03-27 MED ORDER — PRAVASTATIN SODIUM 40 MG PO TABS
40.0000 mg | ORAL_TABLET | Freq: Every evening | ORAL | Status: DC
  Administered 2016-03-28 – 2016-04-01 (×5): 40 mg via ORAL
  Filled 2016-03-27 (×5): qty 1

## 2016-03-27 MED ORDER — DICYCLOMINE HCL 20 MG PO TABS: 20.0000 mg | ORAL_TABLET | Freq: Every day | ORAL | Status: DC | PRN

## 2016-03-27 MED ORDER — AZTREONAM 1 G IJ SOLR
1.0000 g | Freq: Three times a day (TID) | INTRAMUSCULAR | Status: DC
  Filled 2016-03-27 (×2): qty 1

## 2016-03-27 MED ORDER — ACETAMINOPHEN 325 MG PO TABS
650.0000 mg | ORAL_TABLET | Freq: Once | ORAL | Status: AC
  Administered 2016-03-27: 650 mg via ORAL

## 2016-03-27 MED ORDER — HEPARIN SODIUM (PORCINE) 5000 UNIT/ML IJ SOLN
5000.0000 [IU] | Freq: Three times a day (TID) | INTRAMUSCULAR | Status: DC
  Administered 2016-03-28: 5000 [IU] via SUBCUTANEOUS
  Filled 2016-03-27: qty 1

## 2016-03-27 MED ORDER — MORPHINE SULFATE (PF) 2 MG/ML IV SOLN
1.0000 mg | INTRAVENOUS | Status: DC | PRN
  Administered 2016-03-28 (×3): 1 mg via INTRAVENOUS
  Filled 2016-03-27 (×3): qty 1

## 2016-03-27 MED ORDER — ACETAMINOPHEN 325 MG PO TABS
650.0000 mg | ORAL_TABLET | Freq: Four times a day (QID) | ORAL | Status: DC | PRN
  Administered 2016-03-28 – 2016-04-01 (×5): 650 mg via ORAL
  Filled 2016-03-27 (×5): qty 2

## 2016-03-27 MED ORDER — ACETAMINOPHEN-CODEINE #3 300-30 MG PO TABS
1.0000 | ORAL_TABLET | Freq: Four times a day (QID) | ORAL | Status: DC | PRN
  Administered 2016-03-27 – 2016-03-28 (×3): 2 via ORAL
  Filled 2016-03-27 (×3): qty 2

## 2016-03-27 MED ORDER — POTASSIUM CHLORIDE IN NACL 20-0.9 MEQ/L-% IV SOLN: INTRAVENOUS | Status: DC

## 2016-03-27 MED ORDER — SODIUM CHLORIDE 0.9 % IV SOLN
1250.0000 mg | INTRAVENOUS | Status: DC
  Administered 2016-03-29: 1250 mg via INTRAVENOUS
  Filled 2016-03-27 (×2): qty 1250

## 2016-03-27 NOTE — ED Notes (Signed)
The pt fell 3 weeks ago at home   Causing an abrasion to her middile posterior chest  She has been to see her doctor but was told she had to have  A [place] before any treatment could be started.  She has a lesion middle of her posterior chest old abrasion that has purulent thick drainage.  She also has skin break down under both breasts since the fall and there is a foul odor coming from that area.  She is a diabetic

## 2016-03-27 NOTE — Progress Notes (Signed)
Pharmacy Antibiotic Note  Joy Butler is a 65 y.o. female admitted on 03/27/2016 with a wound infection on her back draining pus for the past several days.  Pharmacy has been consulted for Aztroenam and Vancomycin dosing.   She has already received a dose of vancomycin 1g IV in the ED.   Plan: -Vancomycin 1g IV x1 again to be given in order to complete a 2g load, followed by vancomycin 1250mg  IV q24h starting 4/8 -Vanc trough goal 15-25mcg/mL -Aztreonam 1g IV q8h  Weight: 222 lb 5 oz (100.84 kg)  Temp (24hrs), Avg:98.7 F (37.1 C), Min:98.7 F (37.1 C), Max:98.7 F (37.1 C)   Recent Labs Lab 03/27/16 1652 03/27/16 1721 03/27/16 2102  WBC 16.7*  --   --   CREATININE 1.95*  --   --   LATICACIDVEN  --  1.91 2.41*    Estimated Creatinine Clearance: 35.8 mL/min (by C-G formula based on Cr of 1.95).    Allergies  Allergen Reactions  . Keflex [Cephalexin] Itching and Swelling    Lips and eyes swelled up  . Clindamycin/Lincomycin Itching  . Latex Itching  . Sulfa Antibiotics Rash    Antimicrobials this admission: Aztreonam 4/7>> Vancomycin 4/7>>  Metronidazole 4/7 x1  Dose adjustments this admission: None  Microbiology results: 4/7 BCx2: 4/7 UCx:   Thank you for allowing pharmacy to be a part of this patient's care.  Simmone Cape D. Rakeya Glab, PharmD, BCPS Clinical Pharmacist Pager: [phone] 03/27/2016 9:41 PM

## 2016-03-27 NOTE — ED Provider Notes (Signed)
CSN: [id number]     Arrival date & time 03/27/16  1633 History   First MD Initiated Contact with Patient 03/27/16 1912     Chief Complaint  Patient presents with  . Wound Infection     (Consider location/radiation/quality/duration/timing/severity/associated sxs/prior Treatment) HPI Patient got a ration on her back about 3 weeks ago from a fall. She states it got a scab on it and remained painful. She reports this week however it got much more swollen and now the scab came off and it started draining pus over the past several days. She reports the area is very painful. She also reports she has developed moist rash with foul-smelling drainage under both breasts. She denies fevers but reports she has felt like she's had some chills. Patient is diabetic. She reports her blood sugars have been fairly well-controlled. She denies history of similar wounds. She denies general malaise, shortness of breath or other associated symptoms. Past Medical History  Diagnosis Date  . Hypertension   . Pulmonary embolism ([place])     2011 treated at [facility] in [place].  Was on Coumadin for over a year.  no known family history  . UTI (urinary tract infection)   . High cholesterol   . 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 Depression   . Neuropathy (HCC)     feet  . Kidney stones   . Pneumonia     "several times" (07/26/2014)  . Type II diabetes mellitus ([place]) dx'd 2000  . Daily headache   . Migraine     "last one was in the 1990's" (07/26/2014)  . Arthritis     "knees" (07/26/2014)  . Chronic back pain   . Cataract of both eyes   . Hypertensive emergency 12/03/2015   Past Surgical History  Procedure Laterality Date  . Cholecystectomy      1980  . Esophagogastroduodenoscopy  08/19/2012    Procedure: ESOPHAGOGASTRODUODENOSCOPY (EGD);  Surgeon: Winfield Cunas., MD;  Location: [facility] ENDOSCOPY;  Service: Endoscopy;  Laterality: N/A;  . Cesarean section  1982  . Carpal tunnel release Bilateral   .  Tonsillectomy    . Artery biopsy Right 05/09/2014    Procedure: BIOPSY TEMPORAL ARTERY;  Surgeon: Rosetta Posner, MD;  Location: [place];  Service: Vascular;  Laterality: Right;  . Appendectomy    . Vaginal hysterectomy    . Dilation and curettage of uterus  1982  . Cystoscopy w/ stone manipulation    . Breast biopsy Left   . Temporal artery biopsy / ligation Right 04/2014  . Eye surgery Bilateral   . Refractive surgery Bilateral   . Esophagogastroduodenoscopy (egd) with propofol N/A 12/06/2015    Procedure: ESOPHAGOGASTRODUODENOSCOPY (EGD) WITH PROPOFOL;  Surgeon: Wilford Corner, MD;  Location: [facility] ENDOSCOPY;  Service: Endoscopy;  Laterality: N/A;  . Colonoscopy with propofol N/A 12/06/2015    Procedure: COLONOSCOPY WITH PROPOFOL;  Surgeon: Wilford Corner, MD;  Location: [facility] ENDOSCOPY;  Service: Endoscopy;  Laterality: N/A;   Family History  Problem Relation Age of Onset  . Adopted: Yes  . Hypertension Mother    Social History  Substance Use Topics  . Smoking status: Current Every Day Smoker -- 0.25 packs/day for 44 years    Types: Cigarettes  . Smokeless tobacco: Never Used     Comment:  5 CIGARETTES A DAY or less  . Alcohol Use: No   OB History    No data available     Review  of Systems 10 Systems reviewed and are negative for acute change except as noted in the HPI.   Allergies  Keflex; Clindamycin/lincomycin; Latex; and Sulfa antibiotics  Home Medications   Prior to Admission medications   Medication Sig Start Date End Date Taking? Authorizing Provider  acetaminophen-codeine (TYLENOL #3) 300-30 MG tablet TAKE 1 TO 2 TABLETS BY MOUTH EVERY 6 HOURS AS NEEDED FOR MODERATE PAIN **MUST LAST 30 DAYS** 12/11/15  Yes [name], MD  amLODipine (NORVASC) 10 MG tablet TAKE 1 TABLET BY MOUTH EVERY DAY 10/23/15  Yes Bartholomew Crews, MD  dicyclomine (BENTYL) 20 MG tablet Take 1 tablet (20 mg total) by mouth 4 (four) times daily. Patient taking differently: Take 20 mg by mouth  daily as needed for spasms.  12/18/15  Yes [name], MD  diphenoxylate-atropine (LOMOTIL) 2.5-0.025 MG tablet Take 1 tablet by mouth 4 (four) times daily as needed for diarrhea or loose stools. 03/07/16  Yes Orpah Greek, MD  gabapentin (NEURONTIN) 300 MG capsule TAKE 1 CAPSULE BY MOUTH THREE TIMES A DAY 09/25/15  Yes [name], MD  insulin NPH-regular Human (NOVOLIN 70/30) (70-30) 100 UNIT/ML injection Inject 20 Units into the skin 2 (two) times daily with a meal. Patient taking differently: Inject 25 Units into the skin 2 (two) times daily with a meal.  01/13/16  Yes Francesca Oman, DO  loratadine (CLARITIN) 10 MG tablet Take 1 tablet (10 mg total) by mouth daily. Patient taking differently: Take 10 mg by mouth daily as needed for allergies or rhinitis.  08/19/15 08/18/16 Yes [name], MD  losartan-hydrochlorothiazide (HYZAAR) 100-25 MG tablet TAKE 1 TABLET BY MOUTH EVERY DAY 03/26/16  Yes [name], MD  pravastatin (PRAVACHOL) 40 MG tablet TAKE 1 TABLET BY MOUTH EVERY EVENING 01/20/16  Yes [name], MD  Rivaroxaban (XARELTO) 15 MG TABS tablet Take 1 tablet (15 mg total) by mouth daily with supper. 03/09/16  Yes Zada Finders, MD  SANTYL ointment APPLY TO AFFECTED AREA DAILY 12/18/15  Yes [name], MD  traZODone (DESYREL) 150 MG tablet TAKE 1 TABLET BY MOUTH EVERY NIGHT AT BEDTIME 12/11/15  Yes [name], MD  HYDROcodone-acetaminophen (NORCO/VICODIN) 5-325 MG tablet Take 1 tablet by mouth every 4 (four) hours as needed for moderate pain. 03/09/16   Milagros Loll, MD   BP 163/86 mmHg  Pulse 96  Temp(Src) 98.7 F (37.1 C) (Oral)  Resp 20  Wt 222 lb 5 oz (100.84 kg)  SpO2 99% Physical Exam  Constitutional: She is oriented to person, place, and time.  Patient sitting at bed nontoxic and alert. Moderately obese and deconditioned. No respiratory distress.  HENT:  Head: Normocephalic and atraumatic.  Mouth/Throat: Oropharynx is clear and moist.  Eyes: EOM are  normal. Pupils are equal, round, and reactive to light.  Neck: Neck supple.  Cardiovascular: Normal rate, regular rhythm, normal heart sounds and intact distal pulses.   Pulmonary/Chest: Effort normal and breath sounds normal. No respiratory distress. She has no wheezes. She has no rales.  Abdominal: Soft. She exhibits no distension. There is no tenderness.  Musculoskeletal: Normal range of motion. She exhibits no edema or tenderness.  Neurological: She is alert and oriented to person, place, and time. Coordination normal.  Skin: Skin is warm and dry.  Patient has macerated candidal rash beneath will breast folds. This encompasses the majority of the skin beneath the breasts. Associated foul discharge. No cellulitis or mastitis.  Very large indurated abscessed region in the central thoracic back. The  attached image. Reviewed induration on the horizontal plane is 20 cm. Induration in the vertical plane is all centimeters. With compression, thin purulent material oozes from openings as pictured. I cannot identify a specific fluctuance but pus is present as evidenced by drainage with pressure.  Psychiatric: She has a normal mood and affect.        ED Course  Procedures (including critical care time) Labs Review Labs Reviewed  COMPREHENSIVE METABOLIC PANEL - Abnormal; Notable for the following:    Potassium 3.0 (*)    CO2 19 (*)    Glucose, Bld 157 (*)    BUN 22 (*)    Creatinine, Ser 1.95 (*)    Calcium 8.5 (*)    Albumin 1.6 (*)    Alkaline Phosphatase 204 (*)    GFR calc non Af Amer 26 (*)    GFR calc Af Amer 30 (*)    All other components within normal limits  CBC WITH DIFFERENTIAL/PLATELET - Abnormal; Notable for the following:    WBC 16.7 (*)    Hemoglobin 11.0 (*)    HCT 34.0 (*)    MCH 25.8 (*)    RDW 18.3 (*)    Platelets 527 (*)    Neutro Abs 13.4 (*)    Basophils Absolute 0.2 (*)    All other components within normal limits  URINALYSIS, ROUTINE W REFLEX MICROSCOPIC  (NOT AT [facility]) - Abnormal; Notable for the following:    APPearance HAZY (*)    Glucose, UA 250 (*)    Hgb urine dipstick SMALL (*)    Bilirubin Urine SMALL (*)    Protein, ur >300 (*)    All other components within normal limits  URINE MICROSCOPIC-ADD ON - Abnormal; Notable for the following:    Squamous Epithelial / LPF 6-30 (*)    Bacteria, UA FEW (*)    Casts HYALINE CASTS (*)    All other components within normal limits  CBG MONITORING, ED - Abnormal; Notable for the following:    Glucose-Capillary 159 (*)    All other components within normal limits  CBG MONITORING, ED - Abnormal; Notable for the following:    Glucose-Capillary 109 (*)    All other components within normal limits  CULTURE, BLOOD (ROUTINE X 2)  CULTURE, BLOOD (ROUTINE X 2)  URINE CULTURE  I-STAT BETA HCG BLOOD, ED ([facility], [facility], [facility] ONLY)  I-STAT CG4 LACTIC ACID, ED  I-STAT CG4 LACTIC ACID, ED  I-STAT CG4 LACTIC ACID, ED    Imaging Review Dg Chest 2 View  03/27/2016  CLINICAL DATA:  [address] 3 weeks ago at home causing abrasion to posterior mid chest, purulent drainage from posterior chest abrasion site, skin breakdown between both breasts since the fall with foul odor, diabetes mellitus, hypertension EXAM: CHEST  2 VIEW COMPARISON:  03/07/2016 FINDINGS: Borderline enlargement of cardiac silhouette. Mild tortuosity of thoracic aorta. Mediastinal contours and pulmonary vascularity normal. Linear subsegmental atelectasis versus scarring at lingula. Lungs otherwise clear. No pleural effusion or pneumothorax. Scattered endplate spur formation thoracic spine. IMPRESSION: No acute abnormalities. Electronically Signed   By: Lavonia Dana M.D.   On: 03/27/2016 17:27   I have personally reviewed and evaluated these images and lab results as part of my medical decision-making.   EKG Interpretation None      Consult: (20:15) internal medicine teaching service for admission. MDM   Final diagnoses:  Abscess  Cellulitis of back  except buttock  Candida infection  Type 2 diabetes mellitus with other skin  complication [facility])   Patient presents with complex abscess of her back. It is draining. There is however very large area of associated induration and cellulitis. He is diabetic however blood sugars are controlled at this time. Although draining, patient may require wider and deeper surgical opening and debridement due to the apparent deep and broad area involved. As other secondary issue, patient has very extensive and exudative Candida under her breasts.    Charlesetta Shanks, MD 03/27/16 2026

## 2016-03-27 NOTE — ED Notes (Signed)
Pt discussed with dr Audie Pinto

## 2016-03-27 NOTE — Progress Notes (Signed)
Pharmacy Antibiotic Note  Joy Butler is a 65 y.o. female admitted on 03/27/2016 with a wound infection on her back draining pus for the past several days.  Pharmacy has been consulted for Aztroenam dosing. She has already received a dose of metronidazole and vancomycin in the ED.   Plan: Start Aztreonam 1 gm IV Q 8 hours Monitor CBC, renal fx, cultures and clinical progress F/u if maintenance doses of Vancomycin are continued   Weight: 222 lb 5 oz (100.84 kg)  Temp (24hrs), Avg:98.7 F (37.1 C), Min:98.7 F (37.1 C), Max:98.7 F (37.1 C)   Recent Labs Lab 03/27/16 1652 03/27/16 1721  WBC 16.7*  --   CREATININE 1.95*  --   LATICACIDVEN  --  1.91    Estimated Creatinine Clearance: 35.8 mL/min (by C-G formula based on Cr of 1.95).    Allergies  Allergen Reactions  . Keflex [Cephalexin] Itching and Swelling    Lips and eyes swelled up  . Clindamycin/Lincomycin Itching  . Latex Itching  . Sulfa Antibiotics Rash    Antimicrobials this admission: 4/7 Aztreonam>> 4/7 Vancomycin>> 4/7 Metronidazole>>  Dose adjustments this admission: None  Microbiology results: 4/7 BCx2>> 4/7 UCx>>   Thank you for allowing pharmacy to be a part of this patient's care.  Albertina Parr, PharmD., BCPS Clinical Pharmacist Pager [phone]

## 2016-03-27 NOTE — H&P (Signed)
Date: 03/27/2016               Patient Name:  Joy Butler MRN: [id number]  DOB: [birth year] Age / Sex: 65 y.o., female   PCP: [name], MD         Medical Service: Internal Medicine Teaching Service         Attending Physician: Dr. Charlesetta Shanks, MD    First Contact: Dr. Loleta Chance Pager: [phone]  Second Contact: Dr. 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 She is unsure how she fell, but since then, she has had stabbing, non-radiating back pain that feels like it's coming from her skin. She has been taking Tylenol #3 for the pain which has not helped. She was seen in our clinic 1 week ago for the above symptoms, where abscess was suspected and an ultrasound was ordered to further evaluate for possible abscess with treatment based on those findings. However, this was not done and she came to the ED as her symptoms continued to worsen. Over the past 2 days, she has noted painful skin breakdown underneath both breasts and suprapubically that is associated with foul smell. She has tried using an antibiotic spray since then, which has not helped. She also endorses chills, but denies fevers, malaise, chest pain, SOB, N/V/D, urinary symptoms, or any other issues at this time. She says she has never had any skin issues like this in the past.  Meds: Current Facility-Administered Medications  Medication Dose Route Frequency Provider Last Rate Last Dose  . aztreonam (AZACTAM) injection 1 g  1 g Intramuscular Q8H  Darnell Level Mancheril, RPH      . metroNIDAZOLE (FLAGYL) IVPB 500 mg  500 mg Intravenous Once Charlesetta Shanks, MD      . vancomycin (VANCOCIN) IVPB 1000 mg/200 mL premix  1,000 mg Intravenous Once Charlesetta Shanks, MD       Current Outpatient Prescriptions  Medication Sig Dispense Refill  . acetaminophen-codeine (TYLENOL #3) 300-30 MG tablet TAKE 1 TO 2 TABLETS BY MOUTH EVERY 6 HOURS AS NEEDED FOR MODERATE PAIN **MUST LAST 30 DAYS** 45 tablet 4  . amLODipine (NORVASC) 10 MG tablet TAKE 1 TABLET BY MOUTH EVERY DAY 30 tablet 11  . dicyclomine (BENTYL) 20 MG tablet Take 1 tablet (20 mg total) by mouth 4 (four) times daily. (Patient taking differently: Take 20 mg by mouth daily as needed for spasms. ) 120 tablet 3  . diphenoxylate-atropine (LOMOTIL) 2.5-0.025 MG tablet Take 1 tablet by mouth 4 (four) times daily as needed for diarrhea or loose stools. 30 tablet 0  . gabapentin (NEURONTIN) 300 MG capsule TAKE 1 CAPSULE BY MOUTH THREE TIMES A DAY 180 capsule 2  . insulin NPH-regular Human (NOVOLIN 70/30) (70-30) 100 UNIT/ML injection Inject 20 Units into the skin 2 (two) times daily with a meal. (Patient taking differently: Inject 25 Units into the skin 2 (two) times daily with a meal. ) 30 mL 1  . loratadine (CLARITIN) 10 MG tablet Take 1 tablet (10 mg total) by mouth  daily. (Patient taking differently: Take 10 mg by mouth daily as needed for allergies or rhinitis. ) 30 tablet 2  . losartan-hydrochlorothiazide (HYZAAR) 100-25 MG tablet TAKE 1 TABLET BY MOUTH EVERY DAY 90 tablet 1  . pravastatin (PRAVACHOL) 40 MG tablet TAKE 1 TABLET BY MOUTH EVERY EVENING 90 tablet 1  . Rivaroxaban (XARELTO) 15 MG TABS tablet Take 1 tablet (15 mg total) by mouth daily with supper. 60 tablet 0  . SANTYL ointment APPLY TO AFFECTED AREA DAILY 90 g 4  . traZODone (DESYREL) 150 MG tablet TAKE 1 TABLET BY MOUTH EVERY NIGHT AT BEDTIME 30 tablet 4  . HYDROcodone-acetaminophen (NORCO/VICODIN) 5-325 MG tablet Take 1 tablet by mouth  every 4 (four) hours as needed for moderate pain. 10 tablet 0    Allergies: Allergies as of 03/27/2016 - Review Complete 03/27/2016  Allergen Reaction Noted  . Keflex [cephalexin] Itching and Swelling 08/19/2012  . Clindamycin/lincomycin Itching 07/27/2014  . Latex Itching 09/01/2012  . Sulfa antibiotics Rash 08/16/2012   Past Medical History  Diagnosis Date  . Hypertension   . Pulmonary embolism ([place])     2011 treated at [facility] in [place].  Was on Coumadin for over a year.  no known family history  . UTI (urinary tract infection)   . High cholesterol   . 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 Depression   . Neuropathy (HCC)     feet  . Kidney stones   . Pneumonia     "several times" (07/26/2014)  . Type II diabetes mellitus ([place]) dx'd 2000  . Daily headache   . Migraine     "last one was in the 1990's" (07/26/2014)  . Arthritis     "knees" (07/26/2014)  . Chronic back pain   . Cataract of both eyes   . Hypertensive emergency 12/03/2015   Past Surgical History  Procedure Laterality Date  . Cholecystectomy      1980  . Esophagogastroduodenoscopy  08/19/2012    Procedure: ESOPHAGOGASTRODUODENOSCOPY (EGD);  Surgeon: Winfield Cunas., MD;  Location: [facility] ENDOSCOPY;  Service: Endoscopy;  Laterality: N/A;  . Cesarean section  1982  . Carpal tunnel release Bilateral   . Tonsillectomy    . Artery biopsy Right 05/09/2014    Procedure: BIOPSY TEMPORAL ARTERY;  Surgeon: Rosetta Posner, MD;  Location: [place];  Service: Vascular;  Laterality: Right;  . Appendectomy    . Vaginal hysterectomy    . Dilation and curettage of uterus  1982  . Cystoscopy w/ stone manipulation    . Breast biopsy Left   . Temporal artery biopsy / ligation Right 04/2014  . Eye surgery Bilateral   . Refractive surgery Bilateral   . Esophagogastroduodenoscopy (egd) with propofol N/A 12/06/2015    Procedure: ESOPHAGOGASTRODUODENOSCOPY (EGD) WITH PROPOFOL;  Surgeon: Wilford Corner, MD;  Location: [facility]  ENDOSCOPY;  Service: Endoscopy;  Laterality: N/A;  . Colonoscopy with propofol N/A 12/06/2015    Procedure: COLONOSCOPY WITH PROPOFOL;  Surgeon: Wilford Corner, MD;  Location: [facility] ENDOSCOPY;  Service: Endoscopy;  Laterality: N/A;   Family History  Problem Relation Age of Onset  . Adopted: Yes  . Hypertension Mother    Social History   Social History  . Marital Status: Single    Spouse Name: N/A  . Number of Children: N/A  . Years of Education: N/A   Occupational History  . Not on file.   Social History Main Topics  . Smoking status: Current Every  Day Smoker -- 0.25 packs/day for 44 years    Types: Cigarettes  . Smokeless tobacco: Never Used     Comment:  5 CIGARETTES A DAY or less  . Alcohol Use: No  . Drug Use: No  . Sexual Activity: Yes   Other Topics Concern  . Not on file   Social History Narrative   Previously divorced now engaged with her partner of 4 years.  Has 6 grown children with 21 grandchildren.  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  Moved to [place] in August 2013 and was transiently homeless until first part of October 2013.      Review of Systems: Pertinent items noted in HPI and remainder of comprehensive ROS otherwise negative.  Physical Exam: Blood pressure 163/86, pulse 96, temperature 98.7 F (37.1 C), temperature source Oral, resp. rate 20, weight 222 lb 5 oz (100.84 kg), SpO2 99 %.   Gen: Uncomfortable -appearing, alert and oriented to person, place, and time HEENT: Oropharynx clear without erythema or exudate.  Neck: No cervical LAD, no thyromegaly or nodules, no JVD noted. CV: Normal rate, regular rhythm, no murmurs, rubs, or gallops Pulmonary: Normal effort, CTA bilaterally, no crackles or wheezes Abdominal: Soft, non-tender, non-distended, without rebound, guarding, or masses Extremities: Distal pulses 2+ in upper and lower extremities bilaterally, no tenderness, erythema or edema Neuro: CN  II-XII grossly intact, no focal weakness or sensory deficits noted Skin: Back lesion as shown below, with increased warmth, induration roughly 5-6cm in diameter. Also with diffuse erythema with serous exudate and satellite lesions throughout both inframammary folds and suprapubic fold. No appreciable skin necrosis.    Lab results: Basic Metabolic Panel:  Recent Labs  03/27/16 1652  NA 140  K 3.0*  CL 109  CO2 19*  GLUCOSE 157*  BUN 22*  CREATININE 1.95*  CALCIUM 8.5*   Liver Function Tests:  Recent Labs  03/27/16 1652  AST 18  ALT 14  ALKPHOS 204*  BILITOT 0.6  PROT 6.6  ALBUMIN 1.6*   CBC:  Recent Labs  03/27/16 1652  WBC 16.7*  NEUTROABS 13.4*  HGB 11.0*  HCT 34.0*  MCV 79.8  PLT 527*   CBG:  Recent Labs  03/27/16 1650 03/27/16 1922  GLUCAP 159* 109*   Imaging results:  Dg Chest 2 View  03/27/2016  CLINICAL DATA:  [address] [address] 3 weeks ago at home causing abrasion to posterior mid chest, purulent drainage from posterior chest abrasion site, skin breakdown between both breasts since the fall with foul odor, diabetes mellitus, hypertension EXAM: CHEST  2 VIEW COMPARISON:  03/07/2016 FINDINGS: Borderline enlargement of cardiac silhouette. Mild tortuosity of thoracic aorta. Mediastinal contours and pulmonary vascularity normal. Linear subsegmental atelectasis versus scarring at lingula. Lungs otherwise clear. No pleural effusion or pneumothorax. Scattered endplate spur formation thoracic spine. IMPRESSION: No acute abnormalities. Electronically Signed   By: Lavonia Dana M.D.   On: 03/27/2016 17:27   Assessment & Plan by Problem: 1.Back abscess - Insulin-dependent diabetic, with worsening back pain after a fall, now with purulent drainage and subjective chills. Here afebrile, VSS, WBC 16. This is most likely a large purulent skin abscess in a diabetic patient. -Received IV vancomycin, aztreonam, and metronidazole because felt to have an element of skin necrosis. Will  continue IV vancomycin alone for now as on our exam, no appreciable necrosis. -Transition to oral abx as indicated (consider doxy for PO since has TMP-SMX and clindamycin mild adverse reactions). Will continue  IV vancomycin alone for now as on our exam, no appreciable necrosis. -Soft tissue ultrasound -Consult surgery in the AM for drainage; NPO at midnight tonight -Continue home Tylenol #3 PRN for pain. Morphine 1mg  q 4hrs for breakthrough pain -Follow-up wound cultures -Repeat CBC, BMP  2. Candidal intertrigo - patient morbidly obese, diabetic with worsening skin breakdown under both breasts and suprapubic region with foul odor from the area. Most likely exudative candidal intertrigo. -Nystatin powder under both breasts and suprapubic intertriginous fold  3. T2DM: Patient reports compliance with her current regimen. Last Hgb A1C was 7.2 on 01/01/16. -SSI -Continue home gabapentin for neuropathy  4. HTN: Hold BP meds in setting of AKI and normotensive pressures -Continue home amlodipine  5. Hx of Recurrent PE: Patient reports she last took Xarelto 2 days ago, has been taking it intermittently because she believes it causes her headaches. Will need to continue indefinitely. -Hold home Xarelto  6. HLD: -Continue home pravastatin 40 mg qhs  7. Insomnia -Continue home trazodone qhs  Dispo: Disposition is deferred at this time, awaiting improvement of current medical problems. Anticipated discharge in approximately 1-3 day(s).   The patient does have a current PCP [name], MD) and does need an [facility] hospital follow-up appointment after discharge.  The patient does have transportation limitations that hinder transportation to clinic appointments.  Signed: Norval Gable, MD 03/27/2016, 8:19 PM

## 2016-03-28 ENCOUNTER — Encounter (HOSPITAL_COMMUNITY): Payer: Self-pay | Admitting: General Surgery

## 2016-03-28 ENCOUNTER — Inpatient Hospital Stay (HOSPITAL_COMMUNITY): Payer: Medicare Other | Admitting: Anesthesiology

## 2016-03-28 ENCOUNTER — Encounter (HOSPITAL_COMMUNITY)
Admission: EM | Disposition: A | Discharge status: Home Health | Payer: Self-pay | Source: Home / Self Care | Attending: Internal Medicine

## 2016-03-28 DIAGNOSIS — B379 Candidiasis, unspecified: Secondary | ICD-10-CM

## 2016-03-28 DIAGNOSIS — L0291 Cutaneous abscess, unspecified: Secondary | ICD-10-CM

## 2016-03-28 DIAGNOSIS — E11628 Type 2 diabetes mellitus with other skin complications: Secondary | ICD-10-CM

## 2016-03-28 DIAGNOSIS — G8929 Other chronic pain: Secondary | ICD-10-CM

## 2016-03-28 DIAGNOSIS — M549 Dorsalgia, unspecified: Secondary | ICD-10-CM

## 2016-03-28 DIAGNOSIS — L03312 Cellulitis of back [any part except buttock]: Secondary | ICD-10-CM

## 2016-03-28 HISTORY — PX: INCISION AND DRAINAGE ABSCESS: SHX5864

## 2016-03-28 LAB — COMPREHENSIVE METABOLIC PANEL
ALBUMIN: 1.5 g/dL — AB (ref 3.5–5.0)
ALK PHOS: 185 U/L — AB (ref 38–126)
ALT: 13 U/L — AB (ref 14–54)
ANION GAP: 14 (ref 5–15)
AST: 18 U/L (ref 15–41)
BILIRUBIN TOTAL: 0.6 mg/dL (ref 0.3–1.2)
BUN: 21 mg/dL — AB (ref 6–20)
CALCIUM: 8.1 mg/dL — AB (ref 8.9–10.3)
CO2: 18 mmol/L — ABNORMAL LOW (ref 22–32)
CREATININE: 1.87 mg/dL — AB (ref 0.44–1.00)
Chloride: 109 mmol/L (ref 101–111)
GFR calc Af Amer: 31 mL/min — ABNORMAL LOW (ref >60)
GFR calc non Af Amer: 27 mL/min — ABNORMAL LOW (ref >60)
GLUCOSE: 94 mg/dL (ref 65–99)
Potassium: 3.2 mmol/L — ABNORMAL LOW (ref 3.5–5.1)
Sodium: 141 mmol/L (ref 135–145)
TOTAL PROTEIN: 5.9 g/dL — AB (ref 6.5–8.1)

## 2016-03-28 LAB — HIV ANTIBODY (ROUTINE TESTING W REFLEX): HIV Screen 4th Generation wRfx: NONREACTIVE

## 2016-03-28 LAB — MRSA PCR SCREENING: MRSA by PCR: NEGATIVE

## 2016-03-28 LAB — CBC
HEMATOCRIT: 31.9 % — AB (ref 36.0–46.0)
HEMOGLOBIN: 10.2 g/dL — AB (ref 12.0–15.0)
MCH: 25.4 pg — AB (ref 26.0–34.0)
MCHC: 32 g/dL (ref 30.0–36.0)
MCV: 79.4 fL (ref 78.0–100.0)
Platelets: 570 10*3/uL — ABNORMAL HIGH (ref 150–400)
RBC: 4.02 MIL/uL (ref 3.87–5.11)
RDW: 18.5 % — ABNORMAL HIGH (ref 11.5–15.5)
WBC: 16.6 10*3/uL — ABNORMAL HIGH (ref 4.0–10.5)

## 2016-03-28 LAB — GLUCOSE, CAPILLARY
GLUCOSE-CAPILLARY: 105 mg/dL — AB (ref 65–99)
GLUCOSE-CAPILLARY: 101 mg/dL — AB (ref 65–99)
Glucose-Capillary: 83 mg/dL (ref 65–99)

## 2016-03-28 SURGERY — INCISION AND DRAINAGE, ABSCESS
Anesthesia: General | Site: Back

## 2016-03-28 MED ORDER — MEPERIDINE HCL 25 MG/ML IJ SOLN: 6.2500 mg | INTRAMUSCULAR | Status: DC | PRN

## 2016-03-28 MED ORDER — PROPOFOL 10 MG/ML IV BOLUS
INTRAVENOUS | Status: DC | PRN
  Administered 2016-03-28: 150 mg via INTRAVENOUS

## 2016-03-28 MED ORDER — LIDOCAINE HCL (CARDIAC) 20 MG/ML IV SOLN
INTRAVENOUS | Status: AC
  Filled 2016-03-28: qty 10

## 2016-03-28 MED ORDER — HEPARIN SODIUM (PORCINE) 5000 UNIT/ML IJ SOLN
5000.0000 [IU] | Freq: Three times a day (TID) | INTRAMUSCULAR | Status: DC
Start: 2016-03-28 — End: 2016-04-01
  Administered 2016-03-28 – 2016-04-01 (×11): 5000 [IU] via SUBCUTANEOUS
  Filled 2016-03-28 (×11): qty 1

## 2016-03-28 MED ORDER — ONDANSETRON HCL 4 MG/2ML IJ SOLN
INTRAMUSCULAR | Status: DC | PRN
  Administered 2016-03-28: 4 mg via INTRAVENOUS

## 2016-03-28 MED ORDER — MIDAZOLAM HCL 2 MG/2ML IJ SOLN
INTRAMUSCULAR | Status: AC
  Filled 2016-03-28: qty 2

## 2016-03-28 MED ORDER — BUPIVACAINE HCL (PF) 0.25 % IJ SOLN
INTRAMUSCULAR | Status: DC | PRN
  Administered 2016-03-28 (×2): 10 mL

## 2016-03-28 MED ORDER — LIDOCAINE HCL (CARDIAC) 20 MG/ML IV SOLN
INTRAVENOUS | Status: DC | PRN
  Administered 2016-03-28: 60 mg via INTRAVENOUS

## 2016-03-28 MED ORDER — SUCCINYLCHOLINE CHLORIDE 20 MG/ML IJ SOLN
INTRAMUSCULAR | Status: DC | PRN
  Administered 2016-03-28: 100 mg via INTRAVENOUS

## 2016-03-28 MED ORDER — FENTANYL CITRATE (PF) 100 MCG/2ML IJ SOLN
INTRAMUSCULAR | Status: DC | PRN
  Administered 2016-03-28: 25 ug via INTRAVENOUS
  Administered 2016-03-28: 100 ug via INTRAVENOUS
  Administered 2016-03-28 (×5): 25 ug via INTRAVENOUS

## 2016-03-28 MED ORDER — ROCURONIUM BROMIDE 50 MG/5ML IV SOLN
INTRAVENOUS | Status: AC
  Filled 2016-03-28: qty 1

## 2016-03-28 MED ORDER — BUPIVACAINE-EPINEPHRINE (PF) 0.25% -1:200000 IJ SOLN
INTRAMUSCULAR | Status: AC
  Filled 2016-03-28: qty 30

## 2016-03-28 MED ORDER — MIDAZOLAM HCL 5 MG/5ML IJ SOLN
INTRAMUSCULAR | Status: DC | PRN
  Administered 2016-03-28: 2 mg via INTRAVENOUS

## 2016-03-28 MED ORDER — ONDANSETRON HCL 4 MG/2ML IJ SOLN
INTRAMUSCULAR | Status: AC
  Filled 2016-03-28: qty 2

## 2016-03-28 MED ORDER — PROPOFOL 10 MG/ML IV BOLUS
INTRAVENOUS | Status: AC
  Filled 2016-03-28: qty 20

## 2016-03-28 MED ORDER — LACTATED RINGERS IV SOLN: INTRAVENOUS | Status: DC

## 2016-03-28 MED ORDER — SODIUM CHLORIDE 0.9 % IV SOLN
INTRAVENOUS | Status: DC | PRN
  Administered 2016-03-28: 13:00:00 via INTRAVENOUS

## 2016-03-28 MED ORDER — FENTANYL CITRATE (PF) 250 MCG/5ML IJ SOLN
INTRAMUSCULAR | Status: AC
  Filled 2016-03-28: qty 5

## 2016-03-28 MED ORDER — 0.9 % SODIUM CHLORIDE (POUR BTL) OPTIME
TOPICAL | Status: DC | PRN
  Administered 2016-03-28: 1000 mL

## 2016-03-28 MED ORDER — LACTATED RINGERS IV SOLN
INTRAVENOUS | Status: DC | PRN
  Administered 2016-03-28: 13:00:00 via INTRAVENOUS

## 2016-03-28 MED ORDER — HYDROMORPHONE HCL 1 MG/ML IJ SOLN: 0.2500 mg | INTRAMUSCULAR | Status: DC | PRN

## 2016-03-28 MED ORDER — OXYCODONE HCL 5 MG PO TABS
5.0000 mg | ORAL_TABLET | ORAL | Status: DC | PRN
  Administered 2016-03-28 – 2016-03-29 (×4): 10 mg via ORAL
  Administered 2016-03-29: 5 mg via ORAL
  Administered 2016-03-29 – 2016-03-30 (×3): 10 mg via ORAL
  Filled 2016-03-28: qty 1
  Filled 2016-03-28 (×7): qty 2

## 2016-03-28 MED ORDER — MORPHINE SULFATE (PF) 2 MG/ML IV SOLN
1.0000 mg | INTRAVENOUS | Status: DC | PRN
  Administered 2016-03-28 – 2016-03-29 (×3): 2 mg via INTRAVENOUS
  Filled 2016-03-28 (×3): qty 1

## 2016-03-28 SURGICAL SUPPLY — 35 items
BANDAGE ELASTIC 4 VELCRO ST LF (GAUZE/BANDAGES/DRESSINGS) IMPLANT
BANDAGE ELASTIC 6 VELCRO ST LF (GAUZE/BANDAGES/DRESSINGS) IMPLANT
BNDG GAUZE ELAST 4 BULKY (GAUZE/BANDAGES/DRESSINGS) ×3 IMPLANT
CANISTER SUCTION 2500CC (MISCELLANEOUS) ×3 IMPLANT
COVER SURGICAL LIGHT HANDLE (MISCELLANEOUS) ×3 IMPLANT
DRAIN PENROSE 3/4X12 (DRAIN) ×3 IMPLANT
DRAPE EXTREMITY T 121X128X90 (DRAPE) IMPLANT
DRAPE LAPAROTOMY T 98X78 PEDS (DRAPES) ×3 IMPLANT
DRAPE PROXIMA HALF (DRAPES) IMPLANT
DRAPE UTILITY XL STRL (DRAPES) ×6 IMPLANT
DRSG PAD ABDOMINAL 8X10 ST (GAUZE/BANDAGES/DRESSINGS) ×3 IMPLANT
ELECT REM PT RETURN 9FT ADLT (ELECTROSURGICAL) ×3
ELECTRODE REM PT RTRN 9FT ADLT (ELECTROSURGICAL) ×1 IMPLANT
GLOVE BIOGEL M STRL SZ7.5 (GLOVE) IMPLANT
GLOVE BIOGEL PI IND STRL 8 (GLOVE) ×1 IMPLANT
GLOVE BIOGEL PI INDICATOR 8 (GLOVE) ×2
GLOVE SURG SS PI 6.5 STRL IVOR (GLOVE) ×3 IMPLANT
GLOVE SURG SS PI 7.5 STRL IVOR (GLOVE) ×6 IMPLANT
GOWN STRL REUS W/ TWL LRG LVL3 (GOWN DISPOSABLE) ×1 IMPLANT
GOWN STRL REUS W/ TWL XL LVL3 (GOWN DISPOSABLE) ×1 IMPLANT
GOWN STRL REUS W/TWL LRG LVL3 (GOWN DISPOSABLE) ×2
GOWN STRL REUS W/TWL XL LVL3 (GOWN DISPOSABLE) ×2
KIT BASIN OR (CUSTOM PROCEDURE TRAY) ×3 IMPLANT
KIT ROOM TURNOVER OR (KITS) ×3 IMPLANT
NS IRRIG 1000ML POUR BTL (IV SOLUTION) ×3 IMPLANT
PACK GENERAL/GYN (CUSTOM PROCEDURE TRAY) ×3 IMPLANT
PAD ARMBOARD 7.5X6 YLW CONV (MISCELLANEOUS) ×3 IMPLANT
SPONGE GAUZE 4X4 12PLY STER LF (GAUZE/BANDAGES/DRESSINGS) IMPLANT
STOCKINETTE IMPERVIOUS 9X36 MD (GAUZE/BANDAGES/DRESSINGS) IMPLANT
STOCKINETTE IMPERVIOUS LG (DRAPES) IMPLANT
SUT ETHILON 2 0 FS 18 (SUTURE) ×6 IMPLANT
SWAB COLLECTION DEVICE MRSA (MISCELLANEOUS) ×3 IMPLANT
TOWEL OR 17X24 6PK STRL BLUE (TOWEL DISPOSABLE) ×3 IMPLANT
TOWEL OR 17X26 10 PK STRL BLUE (TOWEL DISPOSABLE) ×3 IMPLANT
UNDERPAD 30X30 INCONTINENT (UNDERPADS AND DIAPERS) ×3 IMPLANT

## 2016-03-28 NOTE — Consult Note (Signed)
Reason for Consult: back abscess Referring Physician: Dr. Gilles Chiquito   HPI: Joy Butler is a 65 year old female with a history of HTN, PE on Xarelto, but non compliant(last dose on Monday), depression, chronic abdominal pain.  We have been asked to evaluate for a back abscess. The patient reports falling about 1 month ago and developing an opening which began draining spontaneously 2-3 weeks ago.  The drainage has been purulent and foul smelling.  Endorses to fever, chills.  She has been applying a dry dressing to this area. Work up reveals wbc 16.7k, US revealed a phlegmon 4.7x2x2.4cm.  We have therefore been asked to evaluate. The patient has been NPO since yesterday.   Past Medical History  Diagnosis Date  . Hypertension   . Pulmonary embolism ([place])     2011 treated at [facility] in [place].  Was on Coumadin for over a year.  no known family history  . UTI (urinary tract infection)   . High cholesterol   . 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 Depression   . Neuropathy ([facility])     feet  . Kidney stones   . Pneumonia     "several times" (07/26/2014)  . Type II diabetes mellitus ([place]) dx'd 2000  . Daily headache   . Migraine     "last one was in the 1990's" (07/26/2014)  . Arthritis     "knees" (07/26/2014)  . Chronic back pain   . Cataract of both eyes   . Hypertensive emergency 12/03/2015    Past Surgical History  Procedure Laterality Date  . Cholecystectomy      1980  . Esophagogastroduodenoscopy  08/19/2012    Procedure: ESOPHAGOGASTRODUODENOSCOPY (EGD);  Surgeon: Winfield Cunas., MD;  Location: [facility] ENDOSCOPY;  Service: Endoscopy;  Laterality: N/A;  . Cesarean section  1982  . Carpal tunnel release Bilateral   . Tonsillectomy    . Artery biopsy Right 05/09/2014    Procedure: BIOPSY TEMPORAL ARTERY;  Surgeon: Rosetta Posner, MD;  Location: [place];  Service: Vascular;  Laterality: Right;  . Appendectomy    . Vaginal hysterectomy    . Dilation and curettage of  uterus  1982  . Cystoscopy w/ stone manipulation    . Breast biopsy Left   . Temporal artery biopsy / ligation Right 04/2014  . Eye surgery Bilateral   . Refractive surgery Bilateral   . Esophagogastroduodenoscopy (egd) with propofol N/A 12/06/2015    Procedure: ESOPHAGOGASTRODUODENOSCOPY (EGD) WITH PROPOFOL;  Surgeon: Wilford Corner, MD;  Location: [facility] ENDOSCOPY;  Service: Endoscopy;  Laterality: N/A;  . Colonoscopy with propofol N/A 12/06/2015    Procedure: COLONOSCOPY WITH PROPOFOL;  Surgeon: Wilford Corner, MD;  Location: [facility] ENDOSCOPY;  Service: Endoscopy;  Laterality: N/A;    Family History  Problem Relation Age of Onset  . Adopted: Yes  . Hypertension Mother     Social History:  reports that she has been smoking Cigarettes.  She has a 11 pack-year smoking history. She has never used smokeless tobacco. She reports that she does not drink alcohol or use illicit drugs.  Allergies:  Allergies  Allergen Reactions  . Keflex [Cephalexin] Itching and Swelling    Lips and eyes swelled up  . Clindamycin/Lincomycin Itching  . Latex Itching  . Sulfa Antibiotics Rash    Medications:  Scheduled Meds: . amLODipine  10 mg Oral Daily  . gabapentin  300 mg Oral TID  . heparin  5,000 Units Subcutaneous  3 times per day  . insulin aspart  0-15 Units Subcutaneous TID WC  . insulin aspart  0-5 Units Subcutaneous QHS  . nystatin   Topical TID  . pravastatin  40 mg Oral QPM  . vancomycin  1,250 mg Intravenous Q24H   Continuous Infusions:  PRN Meds:.acetaminophen **OR** acetaminophen, acetaminophen-codeine, dicyclomine, morphine injection, traZODone   Results for orders placed or performed during the hospital encounter of 03/27/16 (from the past 48 hour(s))  CBG monitoring, ED     Status: Abnormal   Collection Time: 03/27/16  4:50 PM  Result Value Ref Range   Glucose-Capillary 159 (H) 65 - 99 mg/dL  Comprehensive metabolic panel     Status: Abnormal   Collection Time: 03/27/16  4:52  PM  Result Value Ref Range   Sodium 140 135 - 145 mmol/L   Potassium 3.0 (L) 3.5 - 5.1 mmol/L   Chloride 109 101 - 111 mmol/L   CO2 19 (L) 22 - 32 mmol/L   Glucose, Bld 157 (H) 65 - 99 mg/dL   BUN 22 (H) 6 - 20 mg/dL   Creatinine, Ser 1.95 (H) 0.44 - 1.00 mg/dL   Calcium 8.5 (L) 8.9 - 10.3 mg/dL   Total Protein 6.6 6.5 - 8.1 g/dL   Albumin 1.6 (L) 3.5 - 5.0 g/dL   AST 18 15 - 41 U/L   ALT 14 14 - 54 U/L   Alkaline Phosphatase 204 (H) 38 - 126 U/L   Total Bilirubin 0.6 0.3 - 1.2 mg/dL   GFR calc non Af Amer 26 (L) >60 mL/min   GFR calc Af Amer 30 (L) >60 mL/min    Comment: (NOTE) The eGFR has been calculated using the CKD EPI equation. This calculation has not been validated in all clinical situations. eGFR's persistently <60 mL/min signify possible Chronic Kidney Disease.    Anion gap 12 5 - 15  CBC with Differential     Status: Abnormal   Collection Time: 03/27/16  4:52 PM  Result Value Ref Range   WBC 16.7 (H) 4.0 - 10.5 K/uL   RBC 4.26 3.87 - 5.11 MIL/uL   Hemoglobin 11.0 (L) 12.0 - 15.0 g/dL   HCT 34.0 (L) 36.0 - 46.0 %   MCV 79.8 78.0 - 100.0 fL   MCH 25.8 (L) 26.0 - 34.0 pg   MCHC 32.4 30.0 - 36.0 g/dL   RDW 18.3 (H) 11.5 - 15.5 %   Platelets 527 (H) 150 - 400 K/uL   Neutrophils Relative % 80 %   Lymphocytes Relative 11 %   Monocytes Relative 6 %   Eosinophils Relative 2 %   Basophils Relative 1 %   Neutro Abs 13.4 (H) 1.7 - 7.7 K/uL   Lymphs Abs 1.8 0.7 - 4.0 K/uL   Monocytes Absolute 1.0 0.1 - 1.0 K/uL   Eosinophils Absolute 0.3 0.0 - 0.7 K/uL   Basophils Absolute 0.2 (H) 0.0 - 0.1 K/uL   WBC Morphology MILD LEFT SHIFT (1-5% METAS, OCC MYELO, OCC BANDS)    Smear Review      PLATELET CLUMPS NOTED ON SMEAR, COUNT APPEARS INCREASED  Urinalysis, Routine w reflex microscopic     Status: Abnormal   Collection Time: 03/27/16  5:02 PM  Result Value Ref Range   Color, Urine YELLOW YELLOW   APPearance HAZY (A) CLEAR   Specific Gravity, Urine 1.025 1.005 - 1.030    pH 6.0 5.0 - 8.0   Glucose, UA 250 (A) NEGATIVE mg/dL   Hgb urine  dipstick SMALL (A) NEGATIVE   Bilirubin Urine SMALL (A) NEGATIVE   Ketones, ur NEGATIVE NEGATIVE mg/dL   Protein, ur >300 (A) NEGATIVE mg/dL   Nitrite NEGATIVE NEGATIVE   Leukocytes, UA NEGATIVE NEGATIVE  Urine microscopic-add on     Status: Abnormal   Collection Time: 03/27/16  5:02 PM  Result Value Ref Range   Squamous Epithelial / LPF 6-30 (A) NONE SEEN   WBC, UA 0-5 0 - 5 WBC/hpf   RBC / HPF 0-5 0 - 5 RBC/hpf   Bacteria, UA FEW (A) NONE SEEN   Casts HYALINE CASTS (A) NEGATIVE  I-Stat beta hCG blood, ED     Status: None   Collection Time: 03/27/16  5:19 PM  Result Value Ref Range   I-stat hCG, quantitative <5.0 <5 mIU/mL   Comment 3            Comment:   GEST. AGE      CONC.  (mIU/mL)   <=1 WEEK        5 - 50     2 WEEKS       50 - 500     3 WEEKS       100 - 10,000     4 WEEKS     1,000 - 30,000        FEMALE AND NON-PREGNANT FEMALE:     LESS THAN 5 mIU/mL   I-Stat CG4 Lactic Acid, ED     Status: None   Collection Time: 03/27/16  5:21 PM  Result Value Ref Range   Lactic Acid, Venous 1.91 0.5 - 2.0 mmol/L  CBG monitoring, ED     Status: Abnormal   Collection Time: 03/27/16  7:22 PM  Result Value Ref Range   Glucose-Capillary 109 (H) 65 - 99 mg/dL  Culture, blood (Routine x 2)     Status: None (Preliminary result)   Collection Time: 03/27/16  8:42 PM  Result Value Ref Range   Specimen Description BLOOD RIGHT HAND    Special Requests BOTTLES DRAWN AEROBIC ONLY 5CC    Culture NO GROWTH < 12 HOURS    Report Status PENDING   I-Stat CG4 Lactic Acid, ED     Status: Abnormal   Collection Time: 03/27/16  9:02 PM  Result Value Ref Range   Lactic Acid, Venous 2.41 (HH) 0.5 - 2.0 mmol/L   Comment NOTIFIED PHYSICIAN   Lactic acid, plasma     Status: None   Collection Time: 03/27/16 10:09 PM  Result Value Ref Range   Lactic Acid, Venous 1.8 0.5 - 2.0 mmol/L  Gamma GT     Status: Abnormal   Collection Time:  03/27/16 10:09 PM  Result Value Ref Range   GGT 89 (H) 7 - 50 U/L  Comprehensive metabolic panel     Status: Abnormal   Collection Time: 03/28/16  4:30 AM  Result Value Ref Range   Sodium 141 135 - 145 mmol/L   Potassium 3.2 (L) 3.5 - 5.1 mmol/L   Chloride 109 101 - 111 mmol/L   CO2 18 (L) 22 - 32 mmol/L   Glucose, Bld 94 65 - 99 mg/dL   BUN 21 (H) 6 - 20 mg/dL   Creatinine, Ser 1.87 (H) 0.44 - 1.00 mg/dL   Calcium 8.1 (L) 8.9 - 10.3 mg/dL   Total Protein 5.9 (L) 6.5 - 8.1 g/dL   Albumin 1.5 (L) 3.5 - 5.0 g/dL   AST 18 15 - 41 U/L   ALT 13 (L)  14 - 54 U/L   Alkaline Phosphatase 185 (H) 38 - 126 U/L   Total Bilirubin 0.6 0.3 - 1.2 mg/dL   GFR calc non Af Amer 27 (L) >60 mL/min   GFR calc Af Amer 31 (L) >60 mL/min    Comment: (NOTE) The eGFR has been calculated using the CKD EPI equation. This calculation has not been validated in all clinical situations. eGFR's persistently <60 mL/min signify possible Chronic Kidney Disease.    Anion gap 14 5 - 15  CBC     Status: Abnormal   Collection Time: 03/28/16  4:30 AM  Result Value Ref Range   WBC 16.6 (H) 4.0 - 10.5 K/uL   RBC 4.02 3.87 - 5.11 MIL/uL   Hemoglobin 10.2 (L) 12.0 - 15.0 g/dL   HCT 31.9 (L) 36.0 - 46.0 %   MCV 79.4 78.0 - 100.0 fL   MCH 25.4 (L) 26.0 - 34.0 pg   MCHC 32.0 30.0 - 36.0 g/dL   RDW 18.5 (H) 11.5 - 15.5 %   Platelets 570 (H) 150 - 400 K/uL  MRSA PCR Screening     Status: None   Collection Time: 03/28/16  6:49 AM  Result Value Ref Range   MRSA by PCR NEGATIVE NEGATIVE    Comment:        The GeneXpert MRSA Assay (FDA approved for NASAL specimens only), is one component of a comprehensive MRSA colonization surveillance program. It is not intended to diagnose MRSA infection nor to guide or monitor treatment for MRSA infections.     Dg Chest 2 View  03/27/2016  CLINICAL DATA:  [address] [address] 3 weeks ago at home causing abrasion to posterior mid chest, purulent drainage from posterior chest abrasion site,  skin breakdown between both breasts since the fall with foul odor, diabetes mellitus, hypertension EXAM: CHEST  2 VIEW COMPARISON:  03/07/2016 FINDINGS: Borderline enlargement of cardiac silhouette. Mild tortuosity of thoracic aorta. Mediastinal contours and pulmonary vascularity normal. Linear subsegmental atelectasis versus scarring at lingula. Lungs otherwise clear. No pleural effusion or pneumothorax. Scattered endplate spur formation thoracic spine. IMPRESSION: No acute abnormalities. Electronically Signed   By: Lavonia Dana M.D.   On: 03/27/2016 17:27   [place] Chest  03/28/2016  CLINICAL DATA:  Area of redness and drainage about the posterior chest/ back at the level of T8. EXAM: CHEST ULTRASOUND COMPARISON:  None. FINDINGS: Targeted sonographic evaluation of the back in the area of redness. Complex heterogeneous collection in the subcutaneous tissues measures 4.7 x 2.0 x 2.4 cm consistent with phlegmon. Some phlegmonous fluid with movement on probe pressure. No peripheral hyperemia. There is edema in the adjacent skin. IMPRESSION: Complex heterogeneous phlegmon in the subcutaneous tissues in the area of clinical concern measuring 4.7 x 2.0 x 2.4 cm. Electronically Signed   By: Jeb Levering M.D.   On: 03/28/2016 02:34    Review of Systems  Constitutional: Positive for fever, chills and malaise/fatigue.  Respiratory: Negative for cough, hemoptysis, sputum production, shortness of breath and wheezing.   Cardiovascular: Negative for chest pain, palpitations, orthopnea, claudication, leg swelling and PND.  Gastrointestinal: Positive for nausea, abdominal pain and diarrhea.  Genitourinary: Negative for dysuria, urgency, frequency, hematuria and flank pain.  Neurological: Negative for dizziness, tingling, tremors, sensory change, speech change, focal weakness, seizures and loss of consciousness.  Psychiatric/Behavioral: Negative for depression, suicidal ideas, hallucinations, memory loss and substance  abuse. The patient is not nervous/anxious and does not have insomnia.    Blood pressure 172/92, pulse 81,  temperature 98.7 F (37.1 C), temperature source Oral, resp. rate 18, weight 100.84 kg (222 lb 5 oz), SpO2 100 %. Physical Exam  Constitutional: She is oriented to person, place, and time. She appears well-developed and well-nourished. No distress.  Cardiovascular: Normal rate, regular rhythm, normal heart sounds and intact distal pulses.  Exam reveals no gallop and no friction rub.   No murmur heard. Respiratory: Effort normal and breath sounds normal. No respiratory distress. She has no wheezes. She has no rales. She exhibits no tenderness.  GI: Soft. Bowel sounds are normal.  Musculoskeletal: Normal range of motion. She exhibits no edema or tenderness.  Neurological: She is alert and oriented to person, place, and time.  Skin: She is not diaphoretic.  approx 4x4 area of induration with cellulitis, central opening with purulent drainage to mid back.   Psychiatric: She has a normal mood and affect. Her behavior is normal. Judgment and thought content normal.    Assessment/Plan: Back abscess-would recommend a bedside I&D, however, the patient wishes to have it performed under anesthesia.  With this, keep NPO for now, will discuss with Dr. Redmond Pulling.   ID-on Vanc,use with caution given renal function.   follow cultures  Rodgerick Gilliand ANP-BC 03/28/2016, 10:04 AM

## 2016-03-28 NOTE — Progress Notes (Addendum)
Subjective: This morning, she acknowledges 10/10 back pain which improves to 5/10 with her pain medication. She acknowledges understanding that she will need surgical I&D.  Objective: Vital signs in last 24 hours: Filed Vitals:   03/27/16 1655 03/27/16 1930 03/27/16 2000 03/27/16 2030  BP:  155/94 151/90 172/92  Pulse:  84 80 81  Temp:      TempSrc:      Resp:  18 14 18   Weight: 222 lb 5 oz (100.84 kg)     SpO2:  100% 100% 100%   Weight change:   Intake/Output Summary (Last 24 hours) at 03/28/16 1133 Last data filed at 03/28/16 0845  Gross per 24 hour  Intake      0 ml  Output    100 ml  Net   -100 ml    General: elderly African American female, resting in bed, mild distress though smiling intermittnetly HEENT: no scleral icterus, extraocular motions intact Cardiac: RRR, no rubs, murmurs or gallops Pulm: clear to auscultation bilaterally, no wheezes, rales, or rhonchi Abd: soft, nontender, nondistended, BS present Back: chevron-shaped area spanning the midline partially covered with bandage with purulent drainage Ext: warm and well perfused, no pedal edema Neuro: responds to questions appropriately; moving all extremities freely  Lab Results: Basic Metabolic Panel:  Recent Labs Lab 03/27/16 1652 03/28/16 0430  NA 140 141  K 3.0* 3.2*  CL 109 109  CO2 19* 18*  GLUCOSE 157* 94  BUN 22* 21*  CREATININE 1.95* 1.87*  CALCIUM 8.5* 8.1*   Liver Function Tests:  Recent Labs Lab 03/27/16 1652 03/28/16 0430  AST 18 18  ALT 14 13*  ALKPHOS 204* 185*  BILITOT 0.6 0.6  PROT 6.6 5.9*  ALBUMIN 1.6* 1.5*   CBC:  Recent Labs Lab 03/27/16 1652 03/28/16 0430  WBC 16.7* 16.6*  NEUTROABS 13.4*  --   HGB 11.0* 10.2*  HCT 34.0* 31.9*  MCV 79.8 79.4  PLT 527* 570*   CBG:  Recent Labs Lab 03/27/16 1650 03/27/16 1922  GLUCAP 159* 109*   Urine Drug Screen: Drugs of Abuse     Component Value Date/Time   LABOPIA POSITIVE* 10/12/2012 0803   COCAINSCRNUR  NONE DETECTED 10/12/2012 0803   LABBENZ NONE DETECTED 10/12/2012 0803   AMPHETMU NONE DETECTED 10/12/2012 0803   THCU NONE DETECTED 10/12/2012 0803   LABBARB NONE DETECTED 10/12/2012 0803    Urinalysis:  Recent Labs Lab 03/27/16 1702  COLORURINE YELLOW  LABSPEC 1.025  PHURINE 6.0  GLUCOSEU 250*  HGBUR SMALL*  BILIRUBINUR SMALL*  KETONESUR NEGATIVE  PROTEINUR >300*  NITRITE NEGATIVE  LEUKOCYTESUR NEGATIVE   Micro Results: Recent Results (from the past 240 hour(s))  Urine culture     Status: None (Preliminary result)   Collection Time: 03/27/16  5:02 PM  Result Value Ref Range Status   Specimen Description URINE, CLEAN CATCH  Final   Special Requests NONE  Final   Culture TOO YOUNG TO READ  Final   Report Status PENDING  Incomplete  Culture, blood (Routine x 2)     Status: None (Preliminary result)   Collection Time: 03/27/16  8:42 PM  Result Value Ref Range Status   Specimen Description BLOOD RIGHT HAND  Final   Special Requests BOTTLES DRAWN AEROBIC ONLY 5CC  Final   Culture NO GROWTH < 12 HOURS  Final   Report Status PENDING  Incomplete  MRSA PCR Screening     Status: None   Collection Time: 03/28/16  6:49 AM  Result Value Ref Range Status   MRSA by PCR NEGATIVE NEGATIVE Final    Comment:        The GeneXpert MRSA Assay (FDA approved for NASAL specimens only), is one component of a comprehensive MRSA colonization surveillance program. It is not intended to diagnose MRSA infection nor to guide or monitor treatment for MRSA infections.    Studies/Results: Dg Chest 2 View  03/27/2016  CLINICAL DATA:  [place] [address] 3 weeks ago at home causing abrasion to posterior mid chest, purulent drainage from posterior chest abrasion site, skin breakdown between both breasts since the fall with foul odor, diabetes mellitus, hypertension EXAM: CHEST  2 VIEW COMPARISON:  03/07/2016 FINDINGS: Borderline enlargement of cardiac silhouette. Mild tortuosity of thoracic aorta. Mediastinal  contours and pulmonary vascularity normal. Linear subsegmental atelectasis versus scarring at lingula. Lungs otherwise clear. No pleural effusion or pneumothorax. Scattered endplate spur formation thoracic spine. IMPRESSION: No acute abnormalities. Electronically Signed   By: Lavonia Dana M.D.   On: 03/27/2016 17:27   [place] Chest  03/28/2016  CLINICAL DATA:  Area of redness and drainage about the posterior chest/ back at the level of T8. EXAM: CHEST ULTRASOUND COMPARISON:  None. FINDINGS: Targeted sonographic evaluation of the back in the area of redness. Complex heterogeneous collection in the subcutaneous tissues measures 4.7 x 2.0 x 2.4 cm consistent with phlegmon. Some phlegmonous fluid with movement on probe pressure. No peripheral hyperemia. There is edema in the adjacent skin. IMPRESSION: Complex heterogeneous phlegmon in the subcutaneous tissues in the area of clinical concern measuring 4.7 x 2.0 x 2.4 cm. Electronically Signed   By: Jeb Levering M.D.   On: 03/28/2016 02:34   Medications: I have reviewed the patient's current medications. Scheduled Meds: . amLODipine  10 mg Oral Daily  . gabapentin  300 mg Oral TID  . heparin  5,000 Units Subcutaneous 3 times per day  . insulin aspart  0-15 Units Subcutaneous TID WC  . insulin aspart  0-5 Units Subcutaneous QHS  . nystatin   Topical TID  . pravastatin  40 mg Oral QPM  . vancomycin  1,250 mg Intravenous Q24H   Continuous Infusions:  PRN Meds:.acetaminophen **OR** acetaminophen, acetaminophen-codeine, dicyclomine, morphine injection, traZODone Assessment/Plan: Active Problems:   Abscess  Ms. Altheide is a 65 year old female with insulin-dependent type 2 diabetes, obesity, hypertension, history of recurrent PE, hyperlipidemia, insomnia, CKD Stage 3 hospitalized for back abscess found to have candidal intertrigo and elevated alkaline phosphatase.  Back abscess: Likely in the setting of diabetes she cannot recall history of trauma or injury.  Ultrasound notable for complex heterogeneous collection in the subcutaneous tissues measuring 4.7 x 2.0 x 2.4 cm cyst with phlegmon. -Consulted surgery for surgical incision and drainage -Continue vancomycin IV with plan to transition to oral antibiotics pending wound cultures, likely doxycycline for MRSA coverage -Recheck BMET and CBC tomorrow morning -Switch to Percocet 5/325mg  every 6 hours as needed with morphine 1mg  ever 1 hour as needed for breakthrough pain [equivalent to to 10% of total opiate dose]  Candidal intertrigo: As noted on admission. -Continue nystatin powder  Elevated alkaline phosphatase: Elevated GGT 89 on admission though LFTs reassuring. Albumin 1.5-1.6 on admission and has been trending low since as far back as August 2013. Abdominal CT 03/07/16 without signs of hepatobiliary disease. Hepatitis B/C negative in 09/06/12. -Consider RUQ [place] for further imaging of bile ducts  Insulin-dependent type 2 diabetes: A1c 7.2, 01/01/16. Her medications include Novolin 70/30 25 units twice daily. -Continue sliding scale insulin -  Continue home gabapentin for neuropathy  History of recurrent PE: Last took Xarelto several days prior to admission the chart review is notable for a history of nonadherence to therapy for unclear reasons. -Continue heparin 5000 units TID  Chronic kidney disease stage III: GFR 30s since admission with Crt 1.9.  -Follow BMET   Hypertension: Her medications include amlodipine 10 mg, losartan/HCTZ 100/25 mg daily. Blood pressures since admission have trended 150s to 170/80s to 90s. -Continue home amlodipine -Holding losartan/HCTZ concern for dehydration on admission  Hyperlipidemia: Home medications include pravastatin 40 mg daily. -Continue home pravastatin  Insomnia: Home medications include trazodone 150 mg at bedtime. -Continue trazodone  #FEN:  -Diet: Nothing by mouth for surgical management followed by carb modified  #DVT prophylaxis: heparin    #CODE STATUS: FULL CODE -Surgical decision maker not established -Confirmed with patient on admission  Dispo: Disposition is deferred at this time, awaiting improvement of current medical problems.  Anticipated discharge in approximately 1-2 day(s).   The patient does have a current PCP [name], MD) and does need an [facility] hospital follow-up appointment after discharge.  The patient does not know have transportation limitations that hinder transportation to clinic appointments.  .Services Needed at time of discharge: Y = Yes, Blank = No PT:   OT:   RN:   Equipment:   Other:     LOS: 1 day   Riccardo Dubin, MD 03/28/2016, 11:33 AM

## 2016-03-28 NOTE — Transfer of Care (Signed)
Immediate Anesthesia Transfer of Care Note  Patient: Joy Butler  Procedure(s) Performed: Procedure(s): INCISION, DRAINAGE  AND DEBRIDEMENT BACK  ABSCESS (N/A)  Patient Location: PACU  Anesthesia Type:General  Level of Consciousness: patient cooperative and responds to stimulation  Airway & Oxygen Therapy: Patient Spontanous Breathing and Patient connected to nasal cannula oxygen  Post-op Assessment: Report given to RN and Post -op Vital signs reviewed and stable  Post vital signs: Reviewed and stable  Last Vitals:  Filed Vitals:   03/27/16 2000 03/27/16 2030  BP: 151/90 172/92  Pulse: 80 81  Temp:    Resp: 14 18    Complications: No apparent anesthesia complications

## 2016-03-28 NOTE — Anesthesia Preprocedure Evaluation (Addendum)
Anesthesia Evaluation  Patient identified by MRN, date of birth, ID band Patient awake    Reviewed: Allergy & Precautions, NPO status , Patient's Chart, lab work & pertinent test results  Airway Mallampati: II  TM Distance: >3 FB Neck ROM: Full    Dental  (+) Edentulous Upper, Edentulous Lower   Pulmonary Current Smoker,    breath sounds clear to auscultation       Cardiovascular hypertension, Pt. on medications  Rhythm:Regular Rate:Normal     Neuro/Psych  Headaches, PSYCHIATRIC DISORDERS Depression  Neuromuscular disease    GI/Hepatic negative GI ROS, Neg liver ROS,   Endo/Other  diabetes, Type 2, Insulin Dependent  Renal/GU Renal InsufficiencyRenal disease  negative genitourinary   Musculoskeletal  (+) Arthritis , Osteoarthritis,    Abdominal (+) + obese,  Abdomen: soft.    Peds negative pediatric ROS (+)  Hematology negative hematology ROS (+)   Anesthesia Other Findings   Reproductive/Obstetrics negative OB ROS                            Lab Results  Component Value Date   WBC 16.6* 03/28/2016   HGB 10.2* 03/28/2016   HCT 31.9* 03/28/2016   MCV 79.4 03/28/2016   PLT 570* 03/28/2016   Lab Results  Component Value Date   CREATININE 1.87* 03/28/2016   BUN 21* 03/28/2016   NA 141 03/28/2016   K 3.2* 03/28/2016   CL 109 03/28/2016   CO2 18* 03/28/2016   Lab Results  Component Value Date   INR 1.0 03/27/2015   INR 1.0 02/07/2015   INR 1.1 10/02/2014    02/2016 EKG: Sinus Tachycardia.   Anesthesia Physical Anesthesia Plan  ASA: III  Anesthesia Plan: General   Post-op Pain Management:    Induction: Intravenous  Airway Management Planned: Oral ETT  Additional Equipment:   Intra-op Plan:   Post-operative Plan: Extubation in OR  Informed Consent: I have reviewed the patients History and Physical, chart, labs and discussed the procedure including the risks,  benefits and alternatives for the proposed anesthesia with the patient or authorized representative who has indicated his/her understanding and acceptance.   Dental advisory given  Plan Discussed with: CRNA  Anesthesia Plan Comments:         Anesthesia Quick Evaluation

## 2016-03-28 NOTE — Progress Notes (Signed)
Cell phone on side rail of bed, with pt to room

## 2016-03-28 NOTE — Op Note (Signed)
03/27/2016 - 03/28/2016  2:11 PM  PATIENT:  Joy Butler  65 y.o. female  PRE-OPERATIVE DIAGNOSIS:  back abscess  POST-OPERATIVE DIAGNOSIS:  back abscess  PROCEDURE:  Procedure(s): INCISION, DRAINAGE  AND DEBRIDEMENT BACK  ABSCESS with scalpel  SURGEON:  Surgeon(s): Greer Pickerel, MD  ASSISTANTS: none   ANESTHESIA:   local and general  DRAINS: Penrose drain in the back abscess   LOCAL MEDICATIONS USED:  MARCAINE     SPECIMEN:  Source of Specimen:  aerobic/anaerobic cultures  DISPOSITION OF SPECIMEN:  micro  COUNTS:  YES  INDICATION FOR PROCEDURE: 65 year old female comes in with fever, elevated white blood cell count and a purulent draining fluctuant upper back abscess. She has a history of abscesses. Even though it was RE somewhat spontaneously draining it was not adequately drained. She declined bedside incision and drainage and requested surgery. Please see my note for additional details  PROCEDURE: After obtaining informed consent the patient was brought to the operating room and placed supine on the operating room table. She was then placed in the left lateral position with the appropriate padding. Sequential compression devices were placed. She was on broad-spectrum therapeutic antibiotics. The area was prepped with Betadine and then draped in the usual standard fashion. The location of the abscess was in her mid upper back over her thoracic spine area extending toward the right side. The abscess measured approximately 15 cm x 10 cm. There is a central area of skin necrosis and drainage. A surgical timeout was performed. Using 10 blade elliptically excise the area of skin and subcutaneous tissue that was necrotic. A large amount of purulent drainage then evacuated cavity. Cultures were obtained. As stated above the abscess cavity extended toward her right side toward her infrascapular area. I decided to make a counterincision and elliptically excised a small piece of skin and  subcutaneous tissue. There is really no necrotic muscle. The majority of the purulence was coming up through the deep dermis. I tried to milk as much of the purulence out of the wound. The cavity was copiously irrigated. Local was infiltrated in the surrounding deep dermis. I threaded a half inch Penrose drain through the abscess cavity and brought out through the counter incision and secured it to itself with 2 interrupted nylon sutures. The remaining cavity was packed with Kerlix after achieving hemostasis with cautery. The patient tolerated this procedure well. She was placed in supine position after dressings were applied and extubated and brought to the recovery room in stable condition. All needle, instrument, and sponge counts were correct 2. There are no immediate complications.   Tool used for debridement (curette, scapel, etc.)  scapel   Frequency of surgical debridement.   initial  Measurement of total devitalized tissue (wound surface) before and after surgical debridement.   15 x 10 cm abscess     Area and depth of devitalized tissue removed from wound.  Sharply excised with scalpel  3cm x 2cm x 1 cm of skin, subcu fat   Blood loss and description of tissue removed.  20cc, see above  PLAN OF CARE: pacu then return to room  PATIENT DISPOSITION:  PACU - hemodynamically stable.   Delay start of Pharmacological VTE agent (>24hrs) due to surgical blood loss or risk of bleeding:  no  Leighton Ruff. Redmond Pulling, MD, FACS General, Bariatric, & Minimally Invasive Surgery [facility], [place]

## 2016-03-29 LAB — GLUCOSE, CAPILLARY
GLUCOSE-CAPILLARY: 102 mg/dL — AB (ref 65–99)
GLUCOSE-CAPILLARY: 125 mg/dL — AB (ref 65–99)
GLUCOSE-CAPILLARY: 94 mg/dL (ref 65–99)
Glucose-Capillary: 85 mg/dL (ref 65–99)
Glucose-Capillary: 104 mg/dL — ABNORMAL HIGH (ref 65–99)

## 2016-03-29 LAB — BASIC METABOLIC PANEL
ANION GAP: 12 (ref 5–15)
BUN: 19 mg/dL (ref 6–20)
CALCIUM: 8 mg/dL — AB (ref 8.9–10.3)
CO2: 15 mmol/L — AB (ref 22–32)
Chloride: 112 mmol/L — ABNORMAL HIGH (ref 101–111)
Creatinine, Ser: 2 mg/dL — ABNORMAL HIGH (ref 0.44–1.00)
GFR calc Af Amer: 29 mL/min — ABNORMAL LOW (ref >60)
GFR calc non Af Amer: 25 mL/min — ABNORMAL LOW (ref >60)
GLUCOSE: 73 mg/dL (ref 65–99)
POTASSIUM: 4.6 mmol/L (ref 3.5–5.1)
Sodium: 139 mmol/L (ref 135–145)

## 2016-03-29 LAB — CBC
HEMATOCRIT: 29.2 % — AB (ref 36.0–46.0)
HEMOGLOBIN: 9.6 g/dL — AB (ref 12.0–15.0)
MCH: 26.3 pg (ref 26.0–34.0)
MCHC: 32.9 g/dL (ref 30.0–36.0)
MCV: 80 fL (ref 78.0–100.0)
Platelets: 505 10*3/uL — ABNORMAL HIGH (ref 150–400)
RBC: 3.65 MIL/uL — ABNORMAL LOW (ref 3.87–5.11)
RDW: 19.4 % — ABNORMAL HIGH (ref 11.5–15.5)
WBC: 18.1 10*3/uL — ABNORMAL HIGH (ref 4.0–10.5)

## 2016-03-29 LAB — URINE CULTURE

## 2016-03-29 MED ORDER — LOSARTAN POTASSIUM-HCTZ 100-25 MG PO TABS: 1.0000 | ORAL_TABLET | Freq: Every day | ORAL | Status: DC

## 2016-03-29 MED ORDER — LOSARTAN POTASSIUM 50 MG PO TABS
100.0000 mg | ORAL_TABLET | Freq: Every day | ORAL | Status: DC
  Administered 2016-03-29 – 2016-04-01 (×4): 100 mg via ORAL
  Filled 2016-03-29 (×4): qty 2

## 2016-03-29 MED ORDER — HYDROCHLOROTHIAZIDE 25 MG PO TABS
25.0000 mg | ORAL_TABLET | Freq: Every day | ORAL | Status: DC
  Administered 2016-03-29 – 2016-04-01 (×4): 25 mg via ORAL
  Filled 2016-03-29 (×4): qty 1

## 2016-03-29 MED ORDER — DOXYCYCLINE HYCLATE 100 MG PO TABS
100.0000 mg | ORAL_TABLET | Freq: Two times a day (BID) | ORAL | Status: DC
  Administered 2016-03-29 – 2016-04-01 (×7): 100 mg via ORAL
  Filled 2016-03-29 (×8): qty 1

## 2016-03-29 NOTE — Progress Notes (Signed)
Patient ID: Joy Butler, female   DOB: Nov 05, 1951, 65 y.o.   MRN: [id number]   Subjective: Joy Butler is still in some pain from her surgery but is feeling much better since the abscess was drained. She doesn't have anybody at home to help her change the dressings. She feels her pain regiment is sufficient and doesn't have any complaints.  Objective: Vital signs in last 24 hours: Filed Vitals:   03/28/16 1430 03/28/16 1445 03/28/16 1900 03/29/16 0519  BP: 125/54  142/56 157/81  Pulse: 80 81 70 83  Temp:  98.3 F (36.8 C) 98.1 F (36.7 C) 99.4 F (37.4 C)  TempSrc:      Resp: 15 12 10 16   Weight:      SpO2: 94% 99% 100% 100%   General: resting in bed comfortably, appropriately conversational Cardiac: regular rate and rhythm, no rubs, murmurs or gallops Pulm: breathing well, clear to auscultation bilaterally Abd: bowel sounds normal, soft, nondistended, non-tender Ext: warm and well perfused, without pedal edema Lymph: no cervical or supraclavicular lymphadenopathy Skin: very large bandage on her mid-back without drainage  Lab Results: Basic Metabolic Panel:  Recent Labs Lab 03/28/16 0430 03/29/16 0615  NA 141 139  K 3.2* 4.6  CL 109 112*  CO2 18* 15*  GLUCOSE 94 73  BUN 21* 19  CREATININE 1.87* 2.00*  CALCIUM 8.1* 8.0*   CBC:  Recent Labs Lab 03/27/16 1652 03/28/16 0430 03/29/16 0615  WBC 16.7* 16.6* 18.1*  NEUTROABS 13.4*  --   --   HGB 11.0* 10.2* 9.6*  HCT 34.0* 31.9* 29.2*  MCV 79.8 79.4 80.0  PLT 527* 570* 505*   Medications: I have reviewed the patient's current medications. Scheduled Meds: . amLODipine  10 mg Oral Daily  . doxycycline  100 mg Oral Q12H  . gabapentin  300 mg Oral TID  . heparin  5,000 Units Subcutaneous 3 times per day  . insulin aspart  0-15 Units Subcutaneous TID WC  . insulin aspart  0-5 Units Subcutaneous QHS  . nystatin   Topical TID  . pravastatin  40 mg Oral QPM   Continuous Infusions:  PRN Meds:.acetaminophen  **OR** acetaminophen, dicyclomine, morphine injection, oxyCODONE, traZODone   Assessment/Plan:  Joy Butler is a 65 year old female with insulin-dependent type 2 diabetes, history of recurrent PE, and CKD Stage 3 hospitalized for back abscess with adjacent purulent cellulitis.  Back abscess with purulent cellulitis: This got drained yesterday in the operating room. There appeared to be some adjacent erythema suggestive of additional purulent cellulitis so we'll continue her on doxycyline for another week to cover MRSA. -Stopped IV vancomycin -Started oral doxycycline (stop date 4/15) -Continue oxycodone 5-10mg  every 4 hours as needed with morphine 1mg  ever 1 hour as needed for breakthrough pain -Consulted home health for dressing changes  Insulin-dependent type 2 diabetes: A1c 7.2, 01/01/16. Her medications include Novolin 70/30 25 units twice daily. -Checking another hemoglobin a1c -Continue sliding scale insulin -Continue home gabapentin for neuropathy  History of recurrent PE: Last took Xarelto several days prior to admission the chart review is notable for a history of nonadherence to therapy for unclear reasons. Given her progressively worsening renal function, we'll resume her on a lower dose of rivaroxaban upon discharge; she may need to be started on warfarin on an outpatient basis. -Continue heparin 5000 units TID -Will resume rivaroxaban at 15mg  daily -May need to change back to warfarin if her renal function continues to worsen  Hypertension: Her medications include amlodipine 10  mg, losartan/HCTZ 100/25 mg daily. Blood pressures since admission have trended 150s to 170/80s to 90s. -Continue home amlodipine -Holding losartan/HCTZ concern for dehydration on admission  Chronic kidney disease stage III: GFR 30s since admission with Crt 1.9.  -Follow BMET  Dispo: Disposition is deferred at this time, awaiting improvement of current medical problems.  Anticipated discharge in  approximately 1-2 day(s).   The patient does have a current PCP Joy Estelle, MD) and does need an [facility] hospital follow-up appointment after discharge.  The patient does have transportation limitations that hinder transportation to clinic appointments.  .Services Needed at time of discharge: Y = Yes, Blank = No PT:   OT:   RN:   Equipment:   Other:     LOS: 2 days   Loleta Chance, MD 03/29/2016, 9:47 AM

## 2016-03-29 NOTE — Progress Notes (Addendum)
1 Day Post-Op  Subjective: Up in chair, C/O some itching near dressing  Objective: Vital signs in last 24 hours: Temp:  [97.3 F (36.3 C)-99.4 F (37.4 C)] 98.6 F (37 C) (04/09 0959) Pulse Rate:  [70-87] 77 (04/09 0959) Resp:  [10-21] 16 (04/09 0519) BP: (117-157)/(50-81) 152/58 mmHg (04/09 0959) SpO2:  [94 %-100 %] 98 % (04/09 0959) Last BM Date: 03/27/16  Intake/Output from previous day: 04/08 0701 - 04/09 0700 In: 940 [P.O.:240; I.V.:700] Out: 120 [Urine:100; Blood:20] Intake/Output this shift:    Back - dressing in place  Lab Results:   Recent Labs  03/28/16 0430 03/29/16 0615  WBC 16.6* 18.1*  HGB 10.2* 9.6*  HCT 31.9* 29.2*  PLT 570* 505*   BMET  Recent Labs  03/28/16 0430 03/29/16 0615  NA 141 139  K 3.2* 4.6  CL 109 112*  CO2 18* 15*  GLUCOSE 94 73  BUN 21* 19  CREATININE 1.87* 2.00*  CALCIUM 8.1* 8.0*   Anti-infectives: Anti-infectives    Start     Dose/Rate Route Frequency Ordered Stop   03/29/16 1000  doxycycline (VIBRA-TABS) tablet 100 mg     100 mg Oral Every 12 hours 03/29/16 0720     03/28/16 2100  vancomycin (VANCOCIN) 1,250 mg in sodium chloride 0.9 % 250 mL IVPB  Status:  Discontinued     1,250 mg 166.7 mL/hr over 90 Minutes Intravenous Every 24 hours 03/27/16 2140 03/29/16 0720   03/27/16 2200  aztreonam (AZACTAM) 1 g in dextrose 5 % 50 mL IVPB  Status:  Discontinued     1 g 100 mL/hr over 30 Minutes Intravenous 3 times per day 03/27/16 2140 03/27/16 2212   03/27/16 2200  vancomycin (VANCOCIN) IVPB 1000 mg/200 mL premix     1,000 mg 200 mL/hr over 60 Minutes Intravenous  Once 03/27/16 2140 03/27/16 2310   03/27/16 2100  aztreonam (AZACTAM) injection 1 g  Status:  Discontinued     1 g Intramuscular Every 8 hours 03/27/16 2016 03/27/16 2140   03/27/16 2015  metroNIDAZOLE (FLAGYL) IVPB 500 mg     500 mg 100 mL/hr over 60 Minutes Intravenous  Once 03/27/16 2010 03/27/16 2207   03/27/16 2015  vancomycin (VANCOCIN) IVPB 1000 mg/200  mL premix     1,000 mg 200 mL/hr over 60 Minutes Intravenous  Once 03/27/16 2010 03/27/16 2207     Results for orders placed or performed during the hospital encounter of 03/27/16  Urine culture     Status: None (Preliminary result)   Collection Time: 03/27/16  5:02 PM  Result Value Ref Range Status   Specimen Description URINE, CLEAN CATCH  Final   Special Requests NONE  Final   Culture TOO YOUNG TO READ  Final   Report Status PENDING  Incomplete  Culture, blood (Routine x 2)     Status: None (Preliminary result)   Collection Time: 03/27/16  8:42 PM  Result Value Ref Range Status   Specimen Description BLOOD RIGHT HAND  Final   Special Requests BOTTLES DRAWN AEROBIC ONLY 5CC  Final   Culture NO GROWTH < 24 HOURS  Final   Report Status PENDING  Incomplete  Wound culture     Status: None (Preliminary result)   Collection Time: 03/28/16  4:01 AM  Result Value Ref Range Status   Specimen Description BACK  Final   Special Requests SPINE  Final   Gram Stain PENDING  Incomplete   Culture   Final    Culture  reincubated for better growth Performed at [facility]    Report Status PENDING  Incomplete  MRSA PCR Screening     Status: None   Collection Time: 03/28/16  6:49 AM  Result Value Ref Range Status   MRSA by PCR NEGATIVE NEGATIVE Final    Comment:        The GeneXpert MRSA Assay (FDA approved for NASAL specimens only), is one component of a comprehensive MRSA colonization surveillance program. It is not intended to diagnose MRSA infection nor to guide or monitor treatment for MRSA infections.      Assessment/Plan: s/p Procedure(s): INCISION, DRAINAGE  AND DEBRIDEMENT BACK  ABSCESS (N/A) POD#1 Joy Butler) We will start changing packing tomorrow Continue ABX Cultures from the OR are pending   LOS: 2 days    Joy Butler 03/29/2016

## 2016-03-29 NOTE — Progress Notes (Signed)
  Date: 03/29/2016  Patient name: Joy Butler  Medical record number: [id number]  Date of birth: 1951-06-05   This patient has been seen and the plan of care was discussed with the house staff. Please see their note for complete details. I concur with their findings with the following additions/corrections:   Given elevated blood pressures, would restart home blood pressure medications today.  Renal function appears to be within baseline, would trend one more day after starting back her home blood pressure medications.  Agree with oral antibiotics.  Will need good follow up with wound care of some sort and possibly general surgery as an outpatient.   Sid Falcon, MD 03/29/2016, 12:38 PM

## 2016-03-29 NOTE — Anesthesia Postprocedure Evaluation (Signed)
Anesthesia Post Note  Patient: Joy Butler  Procedure(s) Performed: Procedure(s) (LRB): INCISION, DRAINAGE  AND DEBRIDEMENT BACK  ABSCESS (N/A)  Patient location during evaluation: PACU Anesthesia Type: General Level of consciousness: awake and alert Pain management: pain level controlled Vital Signs Assessment: post-procedure vital signs reviewed and stable Respiratory status: spontaneous breathing, nonlabored ventilation, respiratory function stable and patient connected to nasal cannula oxygen Cardiovascular status: blood pressure returned to baseline and stable Postop Assessment: no signs of nausea or vomiting Anesthetic complications: no    Last Vitals:  Filed Vitals:   03/28/16 1900 03/29/16 0519  BP: 142/56 157/81  Pulse: 70 83  Temp: 36.7 C 37.4 C  Resp: 10 16    Last Pain:  Filed Vitals:   03/29/16 0623  PainSc: [place] Jubilee Vivero

## 2016-03-30 DIAGNOSIS — E1122 Type 2 diabetes mellitus with diabetic chronic kidney disease: Secondary | ICD-10-CM

## 2016-03-30 DIAGNOSIS — N184 Chronic kidney disease, stage 4 (severe): Secondary | ICD-10-CM

## 2016-03-30 DIAGNOSIS — Z86711 Personal history of pulmonary embolism: Secondary | ICD-10-CM

## 2016-03-30 DIAGNOSIS — I129 Hypertensive chronic kidney disease with stage 1 through stage 4 chronic kidney disease, or unspecified chronic kidney disease: Secondary | ICD-10-CM

## 2016-03-30 DIAGNOSIS — Z7901 Long term (current) use of anticoagulants: Secondary | ICD-10-CM

## 2016-03-30 DIAGNOSIS — Z794 Long term (current) use of insulin: Secondary | ICD-10-CM

## 2016-03-30 DIAGNOSIS — D72829 Elevated white blood cell count, unspecified: Secondary | ICD-10-CM

## 2016-03-30 DIAGNOSIS — Z9889 Other specified postprocedural states: Secondary | ICD-10-CM

## 2016-03-30 DIAGNOSIS — N189 Chronic kidney disease, unspecified: Secondary | ICD-10-CM | POA: Insufficient documentation

## 2016-03-30 DIAGNOSIS — L02212 Cutaneous abscess of back [any part, except buttock]: Principal | ICD-10-CM

## 2016-03-30 DIAGNOSIS — B9561 Methicillin susceptible Staphylococcus aureus infection as the cause of diseases classified elsewhere: Secondary | ICD-10-CM

## 2016-03-30 DIAGNOSIS — M6281 Muscle weakness (generalized): Secondary | ICD-10-CM

## 2016-03-30 DIAGNOSIS — Z9114 Patient's other noncompliance with medication regimen: Secondary | ICD-10-CM

## 2016-03-30 LAB — BASIC METABOLIC PANEL
ANION GAP: 11 (ref 5–15)
BUN: 19 mg/dL (ref 6–20)
CHLORIDE: 107 mmol/L (ref 101–111)
CO2: 19 mmol/L — AB (ref 22–32)
Calcium: 8.2 mg/dL — ABNORMAL LOW (ref 8.9–10.3)
Creatinine, Ser: 2.2 mg/dL — ABNORMAL HIGH (ref 0.44–1.00)
GFR calc Af Amer: 26 mL/min — ABNORMAL LOW (ref >60)
GFR calc non Af Amer: 22 mL/min — ABNORMAL LOW (ref >60)
GLUCOSE: 83 mg/dL (ref 65–99)
POTASSIUM: 3.5 mmol/L (ref 3.5–5.1)
Sodium: 137 mmol/L (ref 135–145)

## 2016-03-30 LAB — GLUCOSE, CAPILLARY
GLUCOSE-CAPILLARY: 91 mg/dL (ref 65–99)
GLUCOSE-CAPILLARY: 103 mg/dL — AB (ref 65–99)
Glucose-Capillary: 114 mg/dL — ABNORMAL HIGH (ref 65–99)
Glucose-Capillary: 142 mg/dL — ABNORMAL HIGH (ref 65–99)

## 2016-03-30 LAB — HEMOGLOBIN A1C
HEMOGLOBIN A1C: 6.8 % — AB (ref 4.8–5.6)
Mean Plasma Glucose: 148 mg/dL

## 2016-03-30 LAB — MAGNESIUM: MAGNESIUM: 1.7 mg/dL (ref 1.7–2.4)

## 2016-03-30 MED ORDER — MORPHINE SULFATE (PF) 2 MG/ML IV SOLN
1.0000 mg | INTRAVENOUS | Status: DC | PRN
  Administered 2016-03-30 (×2): 1 mg via INTRAVENOUS
  Filled 2016-03-30 (×2): qty 1

## 2016-03-30 MED ORDER — OXYCODONE HCL 5 MG PO TABS
5.0000 mg | ORAL_TABLET | ORAL | Status: DC | PRN
  Administered 2016-03-30: 10 mg via ORAL
  Administered 2016-03-30: 15 mg via ORAL
  Filled 2016-03-30: qty 3
  Filled 2016-03-30: qty 2

## 2016-03-30 NOTE — Care Management Important Message (Signed)
Important Message  Patient Details  Name: Joy Butler MRN: [id number] Date of Birth: 1951/10/31   Medicare Important Message Given:  Yes    [name] May 03/30/2016, 12:48 PM

## 2016-03-30 NOTE — Progress Notes (Signed)
Patient ID: Joy Butler, female   DOB: May 20, 1951, 65 y.o.   MRN: [id number]   Subjective: Joy Butler is complaining of some back pain this morning. She also felt she could not move her legs yesterday afternoon, but denies any numbness, slurred speech, or unilateral weakness. She has not been out of bed walking. She denies any calf pain or shortness of breath.  Objective: Vital signs in last 24 hours: Filed Vitals:   03/29/16 0959 03/29/16 1345 03/29/16 2100 03/30/16 0603  BP: 152/58 150/67 156/60 153/66  Pulse: 77 83 80 82  Temp: 98.6 F (37 C) 98.3 F (36.8 C) 98.1 F (36.7 C) 99.1 F (37.3 C)  TempSrc: Oral Oral Oral Oral  Resp:  15  16  Weight:      SpO2: 98% 96% 98% 94%   General: resting in bed comfortably, appropriately conversational Cardiac: regular rate and rhythm, no rubs, murmurs or gallops Pulm: breathing well, clear to auscultation bilaterally Abd: bowel sounds normal, soft, nondistended, non-tender Ext: warm and well perfused, without pedal edema Lymph: no cervical or supraclavicular lymphadenopathy Skin: very large bandage on her mid-back without drainage Neuro: Lethargic, but alert and oriented 3, cranial nerves II through XII intact, bilateral lower extremity strength 4/5, compared to 5/5 strength in the upper extremities, sensation intact to light touch throughout  Lab Results: Basic Metabolic Panel:  Recent Labs Lab 03/29/16 0615 03/30/16 0553  NA 139 137  K 4.6 3.5  CL 112* 107  CO2 15* 19*  GLUCOSE 73 83  BUN 19 19  CREATININE 2.00* 2.20*  CALCIUM 8.0* 8.2*  MG  --  1.7   Hemoglobin A1C:  Recent Labs Lab 03/29/16 0722  HGBA1C 6.8*   Micro Results: Wound culture from 4/1 growing Staphylococcus aureus; sensitivities are not back yet  Medications: I have reviewed the patient's current medications. Scheduled Meds: . amLODipine  10 mg Oral Daily  . doxycycline  100 mg Oral Q12H  . gabapentin  300 mg Oral TID  . heparin  5,000 Units  Subcutaneous 3 times per day  . losartan  100 mg Oral Daily   And  . hydrochlorothiazide  25 mg Oral Daily  . insulin aspart  0-15 Units Subcutaneous TID WC  . insulin aspart  0-5 Units Subcutaneous QHS  . nystatin   Topical TID  . pravastatin  40 mg Oral QPM   Continuous Infusions:  PRN Meds:.acetaminophen **OR** acetaminophen, dicyclomine, morphine injection, oxyCODONE, traZODone   Assessment/Plan:  Joy Butler is a 65 year old female with insulin-dependent type 2 diabetes, history of recurrent pulmonary emboli on rivaroxaban, and CKD stage 3 hospitalized for back abscess with adjacent purulent cellulitis.  Back abscess with purulent cellulitis: This got drained 4/8 in the operating room. Cultures are growing Staphylococcus aureus, but sensitivities are not back yet. There appeared to be some adjacent erythema suggestive of additional purulent cellulitis so we'll continue her on doxycyline for another week to cover MRSA. -Continue oral doxycycline for 2 week course (stop date 4/22) -Continue oxycodone 5-15mg  every 4 hours as needed -Continue morphine 1mg  ever 1 hour as needed for breakthrough pain -Consulted home health for dressing changes   Deconditioning: Her bilateral lower extremities are weak; she has a normal neurologic exam, so I seriously doubt this is a stroke. I think she is simply deconditioned, so we will get physical therapy involved. -Consulted physical therapy  Insulin-dependent type 2 diabetes: A1c looks good at 6.8 this admission. Her medications include Novolin 70/30 25 units twice daily. -  Continue sliding scale insulin -Continue home gabapentin for neuropathy  History of recurrent PE: Last took Xarelto several days prior to admission the chart review is notable for a history of nonadherence to therapy for unclear reasons. Given her progressively worsening renal function, we'll resume her on a lower do se of rivaroxaban upon discharge; she may need to be started on  warfarin on an outpatient basis.  -Continue heparin 5000 units TID -Will resume rivaroxaban at 15mg  daily upon discharge -May need to change back to warfarin if her renal function continues to worsen  Hypertension: Pressures looking better  -Continue home amlodipine 10mg  daily -Continue losartan/HCTZ 100-25mg  daily  Dispo: Disposition is deferred at this time, awaiting improvement of current medical problems.  Anticipated discharge in approximately 1-2 day(s).   The patient does have a current PCP [name], MD) and does need an [facility] hospital follow-up appointment after discharge.  The patient does have transportation limitations that hinder transportation to clinic appointments.  .Services Needed at time of discharge: Y = Yes, Blank = No PT:   OT:   RN:   Equipment:   Other:     LOS: 3 days   Loleta Chance, MD 03/30/2016, 11:47 AM

## 2016-03-30 NOTE — Progress Notes (Signed)
Patient ID: Joy Butler, female   DOB: Mar 13, 1951, 65 y.o.   MRN: [id number] Medicine attending: I examined this patient this morning together with resident physician Dr. Loleta Chance and I concur with his evaluation and management plan which we discussed together. 65 year old woman with insulin-dependent diabetes and stage IV chronic renal insufficiency who presented on April 7 with an extensive abscess in her mid back. She underwent incision and drainage with debridement on April 8. Initial parenteral antibiotics. Transition to oral doxycycline today. She is still quite uncomfortable and having pain requiring narcotic analgesics. She needs at least another day to recuperate in the hospital before stable for discharge. Given her underlying diabetes and the extent of this abscess, I would plan on giving her a total of 14 days of antibiotics. White blood count remains elevated at 18,000.

## 2016-03-30 NOTE — Care Management Note (Signed)
Case Management Note  Patient Details  Name: Joy Butler MRN: [id number] Date of Birth: May 25, 1951  Subjective/Objective:    65 yr old female s/p I & D of back abscess                Action/Plan: Case manager spoke with patient concerning discharge needs. Physical therapy has reccommended SNF for patient, she states she wants to go home and her "husband" will assist her. CM asked patient how much assistance he will be able to provide and she isnt able to answer that/ CM has asked patient to let us know when he arrives so that we can further discuss discharge plan. Patient is lethargic and slow to respond to questions.   Expected Discharge Date:    to be determined              Expected Discharge Plan:     In-House Referral:  Clinical Social Work  Discharge planning Services  CM Consult  Post Acute Care Choice:  Home Health Choice offered to:  Patient  DME Arranged:  N/A DME Agency:     HH Arranged:  [place] Agency:  [place]  Status of Service:  In process, will continue to follow  Medicare Important Message Given:    Date Medicare IM Given:    Medicare IM give by:    Date Additional Medicare IM Given:    Additional Medicare Important Message give by:     If discussed at [place] of Stay Meetings, dates discussed:    Additional Comments:  Ninfa Meeker, RN 03/30/2016, 12:19 PM

## 2016-03-30 NOTE — Progress Notes (Signed)
Patient ID: Joy Butler, female   DOB: November 26, 1951, 65 y.o.   MRN: [id number]   LOS: 3 days   Subjective: No new c/o. Says back has been hurting.   Objective: Vital signs in last 24 hours: Temp:  [98.1 F (36.7 C)-99.1 F (37.3 C)] 99.1 F (37.3 C) (04/10 0603) Pulse Rate:  [80-83] 82 (04/10 0603) Resp:  [15-16] 16 (04/10 0603) BP: (150-156)/(60-67) 153/66 mmHg (04/10 0603) SpO2:  [94 %-98 %] 94 % (04/10 0603) Last BM Date: 03/27/16   Laboratory  CBC  Recent Labs  03/28/16 0430 03/29/16 0615  WBC 16.6* 18.1*  HGB 10.2* 9.6*  HCT 31.9* 29.2*  PLT 570* 505*   BMET  Recent Labs  03/29/16 0615 03/30/16 0553  NA 139 137  K 4.6 3.5  CL 112* 107  CO2 15* 19*  GLUCOSE 73 83  BUN 19 19  CREATININE 2.00* 2.20*  CALCIUM 8.0* 8.2*    Physical Exam General appearance: alert and no distress Back: Packing removed from wound without difficulty. Some mild necrotic areas, no purulence or odor noted, repacked with moistened Kerlix. Pt tolerated very well.   Assessment/Plan: s/p Procedure(s): INCISION, DRAINAGE AND DEBRIDEMENT BACK ABSCESS (N/A) POD#2 ([place]) Packing to commence twice daily Continue ABX for Staph aureus, not sensitized yet    Lisette Abu, PA-C Pager: [phone] 03/30/2016

## 2016-03-30 NOTE — Evaluation (Signed)
Physical Therapy Evaluation Patient Details Name: Joy Butler MRN: [id number] DOB: [birth year] Today's Date: 03/30/2016   History of Present Illness  65 yo admitted with back pain and abscess s/p I&D. PMHx: DM, neuropathy, falls, HTN, PE, IBS  Clinical Impression  Pt was lethargic but pleasant upon arrival. Pt was jerky and buckled throughout session, which she said was not typical. Pt was incontient and was aware of it, but did not notify anyone. Pt has considerable deficits in strength, ROM, and functional mobility. pt required constant cues for all basic mobility. Pt was max assist with all mobility, and was unable to maintain a standing position. Pt requested to move to chair, but was unable to perform standing with use of a stedy and was put back in bed. Pt would benefit from acute therapy to increase strength, ROM, and functional mobility in order to increase her independence and decrease caregiver burden. Recommendation fo follow up in a SNF. Pt does not want to go to a SNF, and so case manager was consulted and said she would call pt's husband to discuss possible discharge plans with her.     Follow Up Recommendations SNF;Supervision/Assistance - 24 hour    Equipment Recommendations  Rolling walker with 5" wheels    Recommendations for Other Services       Precautions / Restrictions Precautions Precautions: Fall      Mobility  Bed Mobility Overal bed mobility: Needs Assistance Bed Mobility: Supine to Sit;Sit to Supine     Supine to sit: Max assist Sit to supine: Mod assist   General bed mobility comments: cues to push off bed, for bringing legs off EOB, and reciprocal scooting. assistance for raising trunk and for reciprocal scooting with use of pad during supine to sit. pt leaned forward upon sitting on EOB and required assistance to maintain sitting balance. pt needed assistance to bring left LE onto bed during supine to sit. pt able to scoot up to [facility] with  trendelenburg and use of rails on her own.  Transfers Overall transfer level: Needs assistance Equipment used: Rolling walker (2 wheeled) Transfers: Sit to/from Stand Sit to Stand: +2 physical assistance;Max assist;From elevated surface         General transfer comment: cues for hand placement and pushing up with hands and legs, as well as for posture once she cleared the bed. pt buckled and sat down upon standing. pt requested to try and stand up, and so stedy was used in attempt, but pt was unable to initiate rise.  Ambulation/Gait             General Gait Details: not performed because patient was lethargic and weak during session and was unable to stand up.  Stairs            Wheelchair Mobility    Modified Rankin (Stroke Patients Only)       Balance Overall balance assessment: Needs assistance   Sitting balance-Leahy Scale: Poor Sitting balance - Comments: pt requires support for trunk to maintain upright     Standing balance-Leahy Scale: Poor                               Pertinent Vitals/Pain Pain Assessment: No/denies pain    Home Living Family/patient expects to be discharged to:: Private residence Living Arrangements: Spouse/significant other Available Help at Discharge: Family;Available 24 hours/day Type of Home: House Home Access: Stairs to enter Entrance Stairs-Rails: Right;Left;Can reach  both Entrance Stairs-Number of Steps: 3 Home Layout: One level Home Equipment: Cane - single point;Shower seat;Walker - 2 wheels      Prior Function Level of Independence: Independent with assistive device(s)         Comments: using RW, multi falls     Hand Dominance   Dominant Hand: Right    Extremity/Trunk Assessment   Upper Extremity Assessment: Generalized weakness           Lower Extremity Assessment: Generalized weakness      Cervical / Trunk Assessment: Kyphotic  Communication   Communication: No difficulties   Cognition Arousal/Alertness: Lethargic Behavior During Therapy: Flat affect Overall Cognitive Status: Impaired/Different from baseline Area of Impairment: Orientation;Attention;Following commands;Safety/judgement;Awareness Orientation Level: Disoriented to;Time;Situation Current Attention Level: Focused Memory: Decreased short-term memory Following Commands: Follows one step commands inconsistently;Follows one step commands with increased time Safety/Judgement: Decreased awareness of safety;Decreased awareness of deficits          General Comments      Exercises        Assessment/Plan    PT Assessment Patient needs continued PT services  PT Diagnosis Difficulty walking;Generalized weakness;Acute pain   PT Problem List Decreased strength;Decreased range of motion;Decreased activity tolerance;Decreased balance;Decreased mobility;Decreased coordination;Decreased cognition;Decreased knowledge of use of DME;Decreased safety awareness;Pain  PT Treatment Interventions DME instruction;Gait training;Stair training;Functional mobility training;Patient/family education;Therapeutic activities;Therapeutic exercise;Balance training   PT Goals (Current goals can be found in the Care Plan section) Acute Rehab PT Goals Patient Stated Goal: none stated    Frequency Min 3X/week   Barriers to discharge Decreased caregiver support pt said that she was unsure if husband would be available 24 hours to provide care    Co-evaluation               End of Session Equipment Utilized During Treatment: Gait belt Activity Tolerance: Patient limited by lethargy Patient left: in bed;with call bell/phone within reach Nurse Communication: Mobility status;Other (comment) (pt lethargic during session, linens and pt wet and needs to be cleaned)         Time: [id number] PT Time Calculation (min) (ACUTE ONLY): 29 min   Charges:   PT Evaluation $PT Eval Moderate Complexity: 1 Procedure PT  Treatments $Therapeutic Activity: 8-22 mins   PT G CodesHaze Justin 04/28/2016, 12:57 PM   Haze Justin, SPT [phone]

## 2016-03-31 ENCOUNTER — Encounter (HOSPITAL_COMMUNITY): Payer: Self-pay | Admitting: General Surgery

## 2016-03-31 ENCOUNTER — Inpatient Hospital Stay (HOSPITAL_COMMUNITY): Payer: Medicare Other

## 2016-03-31 LAB — CBC
HEMATOCRIT: 28 % — AB (ref 36.0–46.0)
Hemoglobin: 9.2 g/dL — ABNORMAL LOW (ref 12.0–15.0)
MCH: 26.3 pg (ref 26.0–34.0)
MCHC: 32.9 g/dL (ref 30.0–36.0)
MCV: 80 fL (ref 78.0–100.0)
Platelets: 530 10*3/uL — ABNORMAL HIGH (ref 150–400)
RBC: 3.5 MIL/uL — AB (ref 3.87–5.11)
RDW: 18.9 % — ABNORMAL HIGH (ref 11.5–15.5)
WBC: 15.9 10*3/uL — AB (ref 4.0–10.5)

## 2016-03-31 LAB — BASIC METABOLIC PANEL
Anion gap: 9 (ref 5–15)
BUN: 19 mg/dL (ref 6–20)
CHLORIDE: 108 mmol/L (ref 101–111)
CO2: 22 mmol/L (ref 22–32)
Calcium: 8.5 mg/dL — ABNORMAL LOW (ref 8.9–10.3)
Creatinine, Ser: 2.11 mg/dL — ABNORMAL HIGH (ref 0.44–1.00)
GFR calc non Af Amer: 23 mL/min — ABNORMAL LOW (ref >60)
GFR, EST AFRICAN AMERICAN: 27 mL/min — AB (ref >60)
Glucose, Bld: 99 mg/dL (ref 65–99)
POTASSIUM: 3.6 mmol/L (ref 3.5–5.1)
SODIUM: 139 mmol/L (ref 135–145)

## 2016-03-31 LAB — GLUCOSE, CAPILLARY
GLUCOSE-CAPILLARY: 110 mg/dL — AB (ref 65–99)
Glucose-Capillary: 106 mg/dL — ABNORMAL HIGH (ref 65–99)
Glucose-Capillary: 93 mg/dL (ref 65–99)
Glucose-Capillary: 95 mg/dL (ref 65–99)

## 2016-03-31 LAB — CULTURE, ROUTINE-ABSCESS

## 2016-03-31 LAB — WOUND CULTURE

## 2016-03-31 LAB — HCV COMMENT:

## 2016-03-31 LAB — HEPATITIS C ANTIBODY (REFLEX)

## 2016-03-31 MED ORDER — TRAMADOL HCL 50 MG PO TABS
50.0000 mg | ORAL_TABLET | Freq: Four times a day (QID) | ORAL | Status: DC | PRN
  Administered 2016-03-31: 50 mg via ORAL
  Filled 2016-03-31: qty 1

## 2016-03-31 MED ORDER — TRAMADOL HCL 50 MG PO TABS: 50.0000 mg | ORAL_TABLET | Freq: Four times a day (QID) | ORAL | Status: DC | PRN

## 2016-03-31 MED ORDER — GABAPENTIN 300 MG PO CAPS
300.0000 mg | ORAL_CAPSULE | Freq: Two times a day (BID) | ORAL | Status: DC
  Administered 2016-03-31 – 2016-04-01 (×2): 300 mg via ORAL
  Filled 2016-03-31 (×2): qty 1

## 2016-03-31 MED ORDER — DOXYCYCLINE HYCLATE 100 MG PO TABS: ORAL_TABLET | ORAL | Status: DC

## 2016-03-31 MED ORDER — INSULIN NPH ISOPHANE & REGULAR (70-30) 100 UNIT/ML ~~LOC~~ SUSP: 25.0000 [IU] | Freq: Two times a day (BID) | SUBCUTANEOUS | Status: DC

## 2016-03-31 NOTE — Discharge Instructions (Signed)
Ok to shower with wound open; do not soak. Wash wound daily with soap and water. Pack wound with moistened gauze twice daily and cover with dry dressing.

## 2016-03-31 NOTE — Progress Notes (Signed)
Patient ID: Joy Butler, female   DOB: Apr 01, 1951, 65 y.o.   MRN: [id number]   Subjective: Joy Butler is feeling well today. Her back pain continues to bother her but this is controlled on oral analgesics. We recommended she go to a SNF but she insisted to go home with home health PT which we've done today. Her leg weakness has resolved.  Objective: Vital signs in last 24 hours: Filed Vitals:   03/30/16 0603 03/30/16 1300 03/30/16 2115 03/31/16 0500  BP: 153/66 147/72 139/64 117/99  Pulse: 82 76 79 78  Temp: 99.1 F (37.3 C) 98.1 F (36.7 C) 97.7 F (36.5 C) 98.2 F (36.8 C)  TempSrc: Oral  Oral Oral  Resp: 16 18 14    Weight:      SpO2: 94% 100% 95% 94%   General: resting in bed comfortably, appropriately conversational Cardiac: regular rate and rhythm, no rubs, murmurs or gallops Pulm: breathing well, clear to auscultation bilaterally Abd: bowel sounds normal, soft, nondistended, non-tender Ext: warm and well perfused, without pedal edema Lymph: no cervical or supraclavicular lymphadenopathy Skin: very large bandage on her mid-back without drainage Neuro: Lethargic, but alert and oriented 3, cranial nerves II through XII intact, bilateral lower extremity strength 5/5, compared to 5/5 strength in the upper extremities, sensation intact to light touch throughout  Lab Results: Basic Metabolic Panel:  Recent Labs Lab 03/30/16 0553 03/31/16 0542  NA 137 139  K 3.5 3.6  CL 107 108  CO2 19* 22  GLUCOSE 83 99  BUN 19 19  CREATININE 2.20* 2.11*  CALCIUM 8.2* 8.5*  MG 1.7  --    CBC:  Recent Labs Lab 03/27/16 1652  03/29/16 0615 03/31/16 0542  WBC 16.7*  < > 18.1* 15.9*  NEUTROABS 13.4*  --   --   --   HGB 11.0*  < > 9.6* 9.2*  HCT 34.0*  < > 29.2* 28.0*  MCV 79.8  < > 80.0 80.0  PLT 527*  < > 505* 530*  < > = values in this interval not displayed.  Micro: Wound cultures growing MRSA  Medications: I have reviewed the patient's current  medications. Scheduled Meds: . amLODipine  10 mg Oral Daily  . doxycycline  100 mg Oral Q12H  . gabapentin  300 mg Oral TID  . heparin  5,000 Units Subcutaneous 3 times per day  . losartan  100 mg Oral Daily   And  . hydrochlorothiazide  25 mg Oral Daily  . insulin aspart  0-15 Units Subcutaneous TID WC  . insulin aspart  0-5 Units Subcutaneous QHS  . nystatin   Topical TID  . 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  MRSA back abscess with purulent cellulitis: This got drained 4/8 in the operating room. Cultures are growing MRSA. There appeared to be some adjacent erythema suggestive of additional purulent cellulitis so we'll continue her on doxycyline for another week to cover MRSA. -Continue oral doxycycline for 2 week course (stop date 4/22) -Continue tramadol 50mg  every 4 hours as needed for pain -Consulted home health for dressing changes   Insulin-dependent type 2 diabetes: A1c looks good at 6.8 this admission. Her medications include Novolin 70/30 25 units twice daily. -Continue sliding  scale insulin -Continue home gabapentin for neuropathy  History of recurrent pulmonary emboli: Given her progressively worsening renal function, we'll resume her on a lower do se of rivaroxaban upon discharge; she may need to be started on warfarin on an outpatient basis.  -Will resume rivaroxaban at 15mg  daily upon discharge -May need to change back to warfarin if her renal function continues to worsen  Hypertension: Pressures look good. -Continue home amlodipine 10mg  daily -Continue losartan/HCTZ 100-25mg  daily  Dispo: Discharge to home today.  The patient does  have a current PCP [name], MD) and does need an [facility] hospital follow-up appointment after discharge.  The patient does have transportation limitations that hinder transportation to clinic appointments.  .Services Needed at time of discharge: Y = Yes, Blank = No PT:   OT:   RN:   Equipment:   Other:     LOS: 4 days   Loleta Chance, MD 03/31/2016, 8:10 AM

## 2016-03-31 NOTE — Progress Notes (Signed)
Patient observed to be, and audibly heard to be disoriented to time and place shortly after receiving IV morphine earlier in the shift. Patient yelling out for help and yelling out for family members. Patient asking where she is. Needing constant reorientation, + effects for short periods of time and patient repeats these actions. Noted pt resting w/ eyes closed at 0300 am.

## 2016-03-31 NOTE — Care Management (Signed)
Case manager spoke with patient and her friend, Mr. Veverly Fells. He states that he will be available to assist her 24/7 at discharge, patient states she does not want to go to SNF, she wants to go home. At this time patient is more alert, sitting on side of the bed. She states she has used [facility] and wants to do so now. Case manager called referral to [facility], [facility] Specialist.

## 2016-03-31 NOTE — Progress Notes (Signed)
Pt's daughter at [facility] stating pt's cell phone is missing, phone unable to be located in room, husband contacted this RN stating concern for pt's belongings in room while daughter in room, this RN communicated with husband of the ability of not being responsible for monitoring pt belongings, 5 Psychologist, sport and exercise notified and updated, husband arrived at [facility] stated missing 20 dollars and cell phone, director updated

## 2016-03-31 NOTE — Progress Notes (Signed)
Patient ID: Joy Butler, female   DOB: [birth year], 65 y.o.   MRN: [id number]   LOS: 4 days   Subjective: Back sore but tolerating dressing changes.   Objective: Vital signs in last 24 hours: Temp:  [97.7 F (36.5 C)-98.2 F (36.8 C)] 98.2 F (36.8 C) (04/11 0500) Pulse Rate:  [76-79] 78 (04/11 0500) Resp:  [14-18] 14 (04/10 2115) BP: (117-147)/(64-99) 117/99 mmHg (04/11 0500) SpO2:  [94 %-100 %] 94 % (04/11 0500) Last BM Date: 03/28/16   Laboratory  CBC  Recent Labs  03/29/16 0615 03/31/16 0542  WBC 18.1* 15.9*  HGB 9.6* 9.2*  HCT 29.2* 28.0*  PLT 505* 530*   BMET  Recent Labs  03/30/16 0553 03/31/16 0542  NA 137 139  K 3.5 3.6  CL 107 108  CO2 19* 22  GLUCOSE 83 99  BUN 19 19  CREATININE 2.20* 2.11*  CALCIUM 8.2* 8.5*   CBG (last 3)   Recent Labs  03/30/16 1625 03/30/16 2133 03/31/16 0529  GLUCAP 114* 142* 106*    Physical Exam Back: Packing in place (just changed), surrounding induration but no erythema   Assessment/Plan: s/p Procedure(s): INCISION, DRAINAGE AND DEBRIDEMENT BACK ABSCESS (N/A) POD#3 ([place]) Packing twice daily Staph aureus intermediate to methcillin (MISA?), sensitive to tetracycline so doxy should be effective, WBC down, afebrile  Ok for discharge from surgical standpoint. Continue bid dressing changes, good glucose control, and doxycycline.     Lisette Abu, PA-C Pager: [phone] 03/31/2016

## 2016-03-31 NOTE — Progress Notes (Signed)
Initial Nutrition Assessment  DOCUMENTATION CODES:   Obesity unspecified  INTERVENTION:  Recommend nutritional supplementation at home post discharge.  Plans for pt to be discharged.   NUTRITION DIAGNOSIS:   Increased nutrient needs related to wound healing as evidenced by estimated needs.  GOAL:   Patient will meet greater than or equal to 90% of their needs  MONITOR:   PO intake, Weight trends, Labs, I & O's  REASON FOR ASSESSMENT:   Malnutrition Screening Tool    ASSESSMENT:   65 year old woman with insulin-dependent diabetes and stage IV chronic renal insufficiency who presented on April 7 with an extensive abscess in her mid back. She underwent incision and drainage with debridement on April 8.   Pt reports appetite has been fine currently and PTA with usual consumption of at least 2 meals a day. Weight has been stable with usual body weight ~220 lbs. Meal completion since admission has been 25-75% with 30% po intake today. Plans for discharge today. Pt was educated on starting nutritional supplementation at home especially if po intake is poor. Pt expressed understanding.   Limited nutrition-Focused physical exam completed. Findings are no fat depletion, no muscle depletion, and no edema.   Labs and medications reviewed.   Diet Order:  Diet Carb Modified Fluid consistency:: Thin; Room service appropriate?: Yes Diet - low sodium heart healthy  Skin:  Wound (see comment) (Unstageable pressure ulcer on L foot)  Last BM:  4/8  Height:   Ht Readings from Last 1 Encounters:  03/20/16 5\' 9"  (1.753 m)    Weight:   Wt Readings from Last 1 Encounters:  03/27/16 222 lb 5 oz (100.84 kg)    Ideal Body Weight:  65.9 kg  BMI:  Body mass index is 32.81 kg/(m^2).  Estimated Nutritional Needs:   Kcal:  1850-2050  Protein:  100-115 grams  Fluid:  1.8- 2 L/day  EDUCATION NEEDS:   Education needs addressed  Corrin Parker, MS, RD, LDN Pager #  [phone] After hours/ weekend pager # [phone]

## 2016-03-31 NOTE — Discharge Summary (Signed)
Name: Joy Butler MRN: [id number] DOB: 08/03/1951 65 y.o. PCP: [name], MD  Date of Admission: 03/27/2016  6:37 PM Date of Discharge: 03/31/2016 Attending Physician: Sid Falcon, MD  Discharge Diagnosis: 1. MRSA back abscess 2. 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     diphenoxylate-atropine 2.5-0.025 MG tablet  Commonly known as:  LOMOTIL  Take 1 tablet by mouth 4 (four) times daily as needed for diarrhea or loose stools.     doxycycline 100 MG tablet  Commonly known as:  VIBRA-TABS  Take 1 tablet (100 mg total) by mouth every 12 (twelve) hours until 4/18     gabapentin 300 MG capsule  Commonly known as:  NEURONTIN  TAKE 1 CAPSULE BY MOUTH THREE TIMES A DAY     insulin NPH-regular Human (70-30) 100 UNIT/ML injection  Commonly known as:  NOVOLIN 70/30  Inject 25 Units into the skin 2 (two) times daily with a meal.     loratadine 10 MG tablet  Commonly known as:  CLARITIN  Take 1 tablet (10 mg total) by mouth daily.     losartan-hydrochlorothiazide 100-25 MG tablet  Commonly known as:  HYZAAR  TAKE 1 TABLET BY MOUTH EVERY DAY     pravastatin 40 MG tablet  Commonly known as:  PRAVACHOL  TAKE 1 TABLET BY MOUTH EVERY EVENING     Rivaroxaban 15 MG Tabs tablet  Commonly known as:  XARELTO  Take 1 tablet (15 mg total) by mouth daily with supper.     traMADol 50 MG tablet  Commonly known as:  ULTRAM  Take 1 tablet (50 mg total) by mouth every 6 (six) hours as needed for moderate pain or severe pain.     traZODone 150 MG tablet  Commonly known as:  DESYREL  TAKE 1 TABLET BY MOUTH EVERY NIGHT AT BEDTIME       Disposition and follow-up:   [name] was discharged from [facility] in Good condition.  At the hospital follow up visit please address:  1. She is getting twice daily dressing changes  2. Check a BMP; if her CrCl continue to decline, she may need to be changed from rivaroxaban back to warfarin  Follow-up Appointments:     Follow-up Information    Schedule an appointment as soon as possible for a visit with Gayland Curry, MD.   Specialty:  General Surgery   Contact information:   [place]  [id number] [phone]       Follow up with [facility].   Why:  Someone from [facility] will contact you concerning start date and time for therapy.   Contact information:   [address] [place] [address] [phone]       Follow up with [facility]. Go on 04/09/2016.   Why:  Hospital follow-up  at [facility] information:   [address] [place] [id number]      Discharge Instructions: Discharge Instructions    Call MD for:  severe uncontrolled pain    Complete by:  As directed      Call MD for:  temperature >100.4    Complete by:  As directed      Diet - low sodium heart healthy    Complete by:  As directed      Face-to-face encounter (required for Medicare/Medicaid patients)    Complete by:  As directed   I Loleta Chance certify that this patient is under my care and that I, or a nurse practitioner or physician's assistant working with me, had a face-to-face encounter that meets the physician face-to-face encounter requirements with this patient on 03/31/2016. The encounter with the patient was in whole, or in part for the following medical condition(s) which is the primary reason for home health care (List medical condition):  1.  Back abscess: she needs twice daily dressing changes and will need to follow up with the general surgeon for a wound check.  2. Deconditioning: she needs physical therapy rehabilitation to increase her strength and balance.  The encounter with the patient was in whole, or in part, for the following medical condition, which is the primary reason for home health care:  back abscess, deconditioning  I certify that, based on my findings, the following services are medically necessary home health services:   Nursing Physical therapy    Reason for Medically Necessary Home Health Services:   Skilled Nursing- Post-Surgical Wound Assessment and Care Therapy- Therapeutic Exercises to Increase Strength and Endurance    My clinical findings support the need for the above services:  Pain interferes with ambulation/mobility  Further, I certify that my clinical findings support that this patient is homebound due to:  Pain interferes with ambulation/mobility     Home Health    Complete by:  As directed   To provide the following care/treatments:   RN PT       Increase activity slowly    Complete by:  As directed            Consultations:  Treatment Team:  Md Ccs, MD  Procedures Performed:  Ct Abdomen Pelvis Wo Contrast  03/07/2016  CLINICAL DATA:  Generalized abdominal pain EXAM: CT ABDOMEN AND PELVIS WITHOUT CONTRAST TECHNIQUE: Multidetector CT imaging of the abdomen and pelvis was performed following the standard protocol without IV contrast. COMPARISON:  12/03/2015 FINDINGS: Lower chest: Mild stable scarring in the bases, left greater than right. Hepatobiliary: Unremarkable unenhanced appearances of the liver and bile ducts. Prior cholecystectomy. Pancreas: Unremarkable unenhanced appearances of the pancreas. Spleen: Scarring, likely due to prior splenic infarctions. Unchanged. Adrenals/Urinary Tract: Unremarkable unenhanced appearances of the adrenals and kidneys except for extensive renal vascular  calcifications. Collecting systems and ureters are unremarkable. Urinary bladder is unremarkable. Stomach/Bowel: There are normal appearances of the stomach, small bowel and colon. The appendix is normal. Vascular/Lymphatic: The abdominal aorta is normal in caliber. There is mild atherosclerotic calcification. There is no adenopathy in the abdomen or pelvis. Reproductive: Uterus and ovaries are unremarkable. Other: No acute inflammatory changes are evident in the abdomen or pelvis. Musculoskeletal: Fat containing umbilical hernia. No significant skeletal lesion. IMPRESSION: No acute findings are evident in the abdomen or pelvis. Remote splenic infarctions. Fat containing umbilical hernia. No interval change from 12/03/2015. Electronically Signed   By: Valerie Roys.D.  On: 03/07/2016 06:12   [place] Chest  03/28/2016  CLINICAL DATA:  Area of redness and drainage about the posterior chest/ back at the level of T8. EXAM: CHEST ULTRASOUND COMPARISON:  None. FINDINGS: Targeted sonographic evaluation of the back in the area of redness. Complex heterogeneous collection in the subcutaneous tissues measures 4.7 x 2.0 x 2.4 cm consistent with phlegmon. Some phlegmonous fluid with movement on probe pressure. No peripheral hyperemia. There is edema in the adjacent skin. IMPRESSION: Complex heterogeneous phlegmon in the subcutaneous tissues in the area of clinical concern measuring 4.7 x 2.0 x 2.4 cm. Electronically Signed   By: Jeb Levering M.D.   On: 03/28/2016 02:34    Admission HPI:   Mrs. 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 She is unsure how she fell, but since then, she has had stabbing, non-radiating back pain that feels like it's coming from her skin. She has  been taking Tylenol #3 for the pain which has not helped. She was seen in our clinic 1 week ago for the above symptoms, where abscess was suspected and an ultrasound was ordered to further evaluate for possible abscess with treatment based on those findings. However, this was not done and she came to the ED as her symptoms continued to worsen. Over the past 2 days, she has noted painful skin breakdown underneath both breasts and suprapubically that is associated with foul smell. She has tried using an antibiotic spray since then, which has not helped. She also endorses chills, but denies fevers, malaise, chest pain, SOB, N/V/D, urinary symptoms, or any other issues at this time. She says she has never had any skin issues like this in the past.  Hospital Course by problem list:   1. MRSA back abscess: She presented with an enlarging back abscess that was drained in the operating room on April 8 without any complications. This abscess grew MRSA sensitive to tetracyclines; she was started on oral doxycycline 100 mg twice daily while in the hospital, and discharged on a total of a 2 week course, stop date 4/18. She was sent home with 21 tablets of tramadol 50 mg for her pain. Home health was ordered to assist her with twice daily dressing changes, and general surgery arranged follow-up in the clinic for another wound check.  2. 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 She was discharged on [place] oxygen and 50 mg daily, but should her renal function further decline, she may need to be transitioned back to warfarin.  Discharge Vitals:   BP 117/99 mmHg  Pulse 78  Temp(Src) 98.2 F (36.8 C) (Oral)  Resp 14  Wt 222 lb 5 oz (100.84 kg)  SpO2 94%  Discharge Labs:  Results for orders placed or performed during the hospital encounter of 03/27/16 (from the past 24 hour(s))  Glucose, capillary     Status: Abnormal   Collection Time:  03/30/16 11:54 AM  Result Value Ref Range   Glucose-Capillary 103 (H) 65 - 99 mg/dL  Glucose, capillary     Status: Abnormal   Collection Time: 03/30/16  4:25 PM  Result Value Ref Range   Glucose-Capillary 114 (H) 65 - 99 mg/dL  Glucose, capillary     Status: Abnormal  Collection Time: 03/30/16  9:33 PM  Result Value Ref Range   Glucose-Capillary 142 (H) 65 - 99 mg/dL   Comment 1 Notify RN   Glucose, capillary     Status: Abnormal   Collection Time: 03/31/16  5:29 AM  Result Value Ref Range   Glucose-Capillary 106 (H) 65 - 99 mg/dL  Basic metabolic panel     Status: Abnormal   Collection Time: 03/31/16  5:42 AM  Result Value Ref Range   Sodium 139 135 - 145 mmol/L   Potassium 3.6 3.5 - 5.1 mmol/L   Chloride 108 101 - 111 mmol/L   CO2 22 22 - 32 mmol/L   Glucose, Bld 99 65 - 99 mg/dL   BUN 19 6 - 20 mg/dL   Creatinine, Ser 2.11 (H) 0.44 - 1.00 mg/dL   Calcium 8.5 (L) 8.9 - 10.3 mg/dL   GFR calc non Af Amer 23 (L) >60 mL/min   GFR calc Af Amer 27 (L) >60 mL/min   Anion gap 9 5 - 15  CBC     Status: Abnormal   Collection Time: 03/31/16  5:42 AM  Result Value Ref Range   WBC 15.9 (H) 4.0 - 10.5 K/uL   RBC 3.50 (L) 3.87 - 5.11 MIL/uL   Hemoglobin 9.2 (L) 12.0 - 15.0 g/dL   HCT 28.0 (L) 36.0 - 46.0 %   MCV 80.0 78.0 - 100.0 fL   MCH 26.3 26.0 - 34.0 pg   MCHC 32.9 30.0 - 36.0 g/dL   RDW 18.9 (H) 11.5 - 15.5 %   Platelets 530 (H) 150 - 400 K/uL    Signed: Loleta Chance, MD 03/31/2016, 11:39 AM   Services Ordered on Discharge: Home health physical therapy and nursing for dressing changes

## 2016-04-01 DIAGNOSIS — M47816 Spondylosis without myelopathy or radiculopathy, lumbar region: Secondary | ICD-10-CM | POA: Insufficient documentation

## 2016-04-01 DIAGNOSIS — E118 Type 2 diabetes mellitus with unspecified complications: Secondary | ICD-10-CM | POA: Insufficient documentation

## 2016-04-01 DIAGNOSIS — I2690 Septic pulmonary embolism without acute cor pulmonale: Secondary | ICD-10-CM

## 2016-04-01 DIAGNOSIS — B9562 Methicillin resistant Staphylococcus aureus infection as the cause of diseases classified elsewhere: Secondary | ICD-10-CM

## 2016-04-01 DIAGNOSIS — R29898 Other symptoms and signs involving the musculoskeletal system: Secondary | ICD-10-CM | POA: Insufficient documentation

## 2016-04-01 DIAGNOSIS — Z794 Long term (current) use of insulin: Secondary | ICD-10-CM

## 2016-04-01 LAB — CULTURE, BLOOD (ROUTINE X 2): CULTURE: NO GROWTH

## 2016-04-01 LAB — GLUCOSE, CAPILLARY
GLUCOSE-CAPILLARY: 92 mg/dL (ref 65–99)
GLUCOSE-CAPILLARY: 142 mg/dL — AB (ref 65–99)
Glucose-Capillary: 131 mg/dL — ABNORMAL HIGH (ref 65–99)

## 2016-04-01 NOTE — Progress Notes (Signed)
Physical Therapy Treatment Patient Details Name: Joy Butler MRN: [id number] DOB: 11-20-1951 Today's Date: 04/01/2016    History of Present Illness 65 yo admitted with back pain and abscess s/p I&D. PMHx: DM, neuropathy, falls, HTN, PE, IBS    PT Comments    Patient is making progress toward mobility goals. Pt needs max encouragement to increase mobility. Continue to progress as tolerated until d/c.   Follow Up Recommendations  SNF;Supervision/Assistance - 24 hour     Equipment Recommendations  Rolling walker with 5" wheels    Recommendations for Other Services       Precautions / Restrictions Precautions Precautions: Fall Restrictions Weight Bearing Restrictions: No    Mobility  Bed Mobility               General bed mobility comments: OOB in chair upon arrival  Transfers Overall transfer level: Needs assistance Equipment used: Rolling walker (2 wheeled) Transfers: Sit to/from Stand Sit to Stand: Mod assist;Min assist         General transfer comment: mod A first trial and min A second and third trials from recliner; cues for hand placement  Ambulation/Gait Ambulation/Gait assistance: Min guard Ambulation Distance (Feet): 70 Feet (73ft and 59ft) Assistive device: Rolling walker (2 wheeled) Gait Pattern/deviations: Step-to pattern;Decreased step length - right;Decreased step length - left;Decreased stride length;Antalgic;Trunk flexed;Wide base of support Gait velocity: decreased   General Gait Details: max cues for sequencing and safe use of AD; one seated rest break; demonstrated bilat LE weakness initially with no LOB but unsteady at times; fatigued quickly   Stairs Stairs: Yes Stairs assistance: Min guard Stair Management: Two rails;Forwards Number of Stairs: 2 General stair comments: educated on sequnecing and technique; min guard for safety; cues for increased safety awareness  Wheelchair Mobility    Modified Rankin (Stroke Patients  Only)       Balance Overall balance assessment: Needs assistance Sitting-balance support: No upper extremity supported;Feet supported Sitting balance-Leahy Scale: Fair     Standing balance support: Bilateral upper extremity supported Standing balance-Leahy Scale: Poor                      Cognition Arousal/Alertness: Lethargic Behavior During Therapy: Flat affect Overall Cognitive Status: No family/caregiver present to determine baseline cognitive functioning Area of Impairment: Orientation;Attention;Following commands;Safety/judgement;Awareness Orientation Level: Disoriented to;Situation Current Attention Level: Focused Memory: Decreased short-term memory Following Commands: Follows one step commands inconsistently;Follows one step commands with increased time Safety/Judgement: Decreased awareness of safety          Exercises      General Comments        Pertinent Vitals/Pain Pain Assessment: 0-10 Pain Score: 9  Pain Location: back Pain Descriptors / Indicators: Sore Pain Intervention(s): Monitored during session;Premedicated before session;Repositioned    Home Living                      Prior Function            PT Goals (current goals can now be found in the care plan section) Acute Rehab PT Goals Patient Stated Goal: be able to walk Progress towards PT goals: Progressing toward goals    Frequency  Min 3X/week    PT Plan Current plan remains appropriate    Co-evaluation             End of Session Equipment Utilized During Treatment: Gait belt Activity Tolerance: Patient tolerated treatment well Patient left: in chair;with call bell/phone within  reach;with nursing/sitter in room     Time: 1255-1331 PT Time Calculation (min) (ACUTE ONLY): 36 min  Charges:  $Gait Training: 8-22 mins $Therapeutic Activity: 8-22 mins                    G Codes:      [place] April, PTA Pager: [phone]   04/01/2016, 1:43 PM

## 2016-04-01 NOTE — Progress Notes (Signed)
Patient ID: Joy Butler, female   DOB: December 17, 1951, 65 y.o.   MRN: [id number]   LOS: 5 days   Subjective: Having a lot of chest and back pain today, the latter extending down to sacral area.   Objective: Vital signs in last 24 hours: Temp:  [98.1 F (36.7 C)-98.4 F (36.9 C)] 98.3 F (36.8 C) (04/12 0605) Pulse Rate:  [76-83] 76 (04/12 0605) Resp:  [16] 16 (04/12 0605) BP: (140-159)/(66-78) 144/66 mmHg (04/12 0605) SpO2:  [92 %-99 %] 92 % (04/12 0605) Last BM Date: 03/28/16   Physical Exam General appearance: alert and mild distress Back: Wound unchanged, indurated but no new erythema   Assessment/Plan: s/p Procedure(s): INCISION, DRAINAGE AND DEBRIDEMENT BACK ABSCESS (N/A) POD#3 ([place]) Packing twice daily Staph aureus intermediate to methcillin (MISA?), sensitive to tetracycline so doxy should be effective,   Ok for discharge from surgical standpoint. Continue bid dressing changes, good glucose control, and doxycycline.     Lisette Abu, PA-C Pager: [phone] 04/01/2016

## 2016-04-01 NOTE — Progress Notes (Signed)
Rept to Dr. Melburn Hake that pt is comfortably sitting in chair and denies CP or any other s/sx distress at this time. EKG NSR. No further orders. Will continue to monitor.

## 2016-04-01 NOTE — Progress Notes (Signed)
L upper back dressing return demonstration with spouse. Old packing removed. Mod amount of yellow drainage noted on old dressing. Wound bed is yellow. Pt has a vaseline gauze inside open area held in place with sutures on the lateral surface of skin externally. Pt is to follow up with surgeon ASAP for wound check after discharge. Spouse and pt  verbalizes understanding of dressing change, frequency and follow up with surgeon and agree to comply. Home health RN to come out to home and assist with further education of dressing change. All discharge teachings done. Pt and spouse verb understanding to obtain tramadol from pharm tonight for pain and will obtain doxycycline and refill of 70/30 from [facility] tomorrow. Pt states [facility] will deliver meds to home. Pt will receive [facility] from [facility]. Pt and spouse verb understanding of all d/c teachings and agree to comply. Pt given RX and copy of written d/c instructions. No change from AM assessment. Pt d/c to home after dressing and d/c teachings via wheelchair with spouse without incident.

## 2016-04-01 NOTE — Consult Note (Addendum)
WOC wound consult note Reason for Consult: Consult requested for left foot wounds.  Pt is well-informed regarding topical treatment and states she has been followed in the past by Dr Sharol Given of the ortho service and was previously using Santyl ointment until the areas healed.   Wound type: Posterior plantar foot with healed lighter-colored scar tissue from previous wound which has healed. Anterior plantar foot with dry callous; 1.2X1.2cm, slightly raised above skin level; no open wound, drainage, or fluctuance.  No topical treatment indicated at this time.   Dressing procedure/placement/frequency: Encouraged patient to resume follow-up with ortho service after discharge to trim the callous and follow-up on orthotic shoes which were supposed to be ordered, according to patient. Please re-consult if further assistance is needed.  Thank-you,  Julien Girt MSN, [place], [place], [place], [place]

## 2016-04-01 NOTE — Clinical Social Work Note (Signed)
Patient continues to refuse SNF placement. CSW emphasized the recommendation of PT and MD, patient expressed understanding of recommendation, but stated patient will be discharging home with significant other.  RNCM notified. CSW signing off.  Lubertha Sayres, [facility]: [phone] Surgical: [phone]

## 2016-04-01 NOTE — Progress Notes (Signed)
Wet to dry dressing change done this am at 0600.  Patient tolerated dressing change with minimum discomfort.

## 2016-04-01 NOTE — Progress Notes (Signed)
Patient ID: Joy Butler, female   DOB: 1951-05-26, 65 y.o.   MRN: [id number]   Subjective: Joy Butler is complaining of some mild chest pressure and burning after eating her pancakes this morning. An EKG showed normal sinus rhythm without ischemic changes. She feels her leg strength is slowly returning but continues to adamantly refuse going to SNF despite our encouragement. We explained the very high fall risk but she continues to request to go home.  Objective: Vital signs in last 24 hours: Filed Vitals:   03/31/16 0500 03/31/16 1228 03/31/16 2025 04/01/16 0605  BP: 117/99 140/67 159/78 144/66  Pulse: 78 83 79 76  Temp: 98.2 F (36.8 C) 98.1 F (36.7 C) 98.4 F (36.9 C) 98.3 F (36.8 C)  TempSrc: Oral  Oral Oral  Resp:  16 16 16   Weight:      SpO2: 94% 95% 99% 92%      General: resting in bed comfortably, appropriately conversational Cardiac: regular rate and rhythm, no rubs, murmurs or gallops Pulm: breathing well, clear to auscultation bilaterally Abd: bowel sounds normal, soft, nondistended, non-tender Ext: warm and well perfused, without pedal edema Lymph: no cervical or supraclavicular lymphadenopathy Skin: very large bandage on her mid-back without drainage Neuro: Lethargic, but alert and oriented 3, cranial nerves II through XII intact, bilateral lower extremity strength 5/5, compared to 5/5 strength in the upper extremities, sensation intact to light touch throughout      Studies/Results: Mr Thoracic Spine Wo Contrast  03/31/2016  CLINICAL DATA:  Right lower extremity weakness. Recent drainage and debridement of back abscess. EXAM: MRI THORACIC AND LUMBAR SPINE WITHOUT CONTRAST TECHNIQUE: Multiplanar and multiecho pulse sequences of the thoracic and lumbar spine were obtained without intravenous contrast. COMPARISON:  CT abdomen and pelvis 03/07/2016. FINDINGS: MR THORACIC SPINE FINDINGS The study is mildly moderately degraded by motion artifact. There is rather  extensive edema throughout the subcutaneous soft tissues of the mid upper back, greatest at the T5 level with overlying skin defect related to recent incision and drainage. A well-defined residual fluid collection is not identified on this unenhanced study. Edema involves the medial aspects of the latissimus dorsi bilaterally. No vertebral marrow edema strongly suggestive of vertebral osteomyelitis is identified. There is very mild endplate edema at [id number] which is favored to be degenerative, with a T6 superior endplate Schmorl's node noted. There is mild T2 hyperintensity in the right anterolateral aspect of the T8-9 disc with mild type 2 endplate changes at this level and no significant vertebral marrow edema. Thoracic vertebral alignment is normal. Bridging osteophytosis is present diffusely throughout the mid and lower thoracic spine. Disc bulging results in mild to moderate spinal stenosis at T7-8, T8-9, T10-11, and T11-12. Facet hypertrophy contributes at the two latter levels. There is also severe right-sided neural foraminal stenosis at T10-11 with mild-to-moderate foraminal narrowing seen elsewhere. No spinal cord compression or gross spinal cord signal abnormality is identified. There are small bilateral pleural effusions. MR LUMBAR SPINE FINDINGS Vertebral alignment is normal. Vertebral body heights are preserved. Disc desiccation is present at L4-5 and L5-S1. There is mild disc space narrowing at L5-S1. No significant vertebral marrow edema is seen. Subcutaneous edema is present in the posterior soft tissues without evidence of focal fluid collection. The conus medullaris is normal in signal and terminates at L1. L1-2:  Negative. L2-3:  Slight facet hypertrophy without disc herniation or stenosis. L3-4: Disc bulging and moderate facet arthrosis result in mild bilateral lateral recess and mild bilateral neural foraminal  stenosis. Moderate facet joint effusions are present bilaterally. L4-5: Circumferential  disc bulging, broad central disc protrusion, ligamentum flavum hypertrophy, and advanced facet arthrosis result in moderate spinal stenosis, bilateral lateral recess stenosis, and moderate bilateral neural foraminal stenosis. A moderate left facet joint effusion is present. L5-S1: Disc bulging, right subarticular disc osteophyte complex, and advanced right facet arthrosis results in mild to moderate right lateral recess stenosis and mild bilateral neural foraminal stenosis without spinal stenosis. IMPRESSION: MR THORACIC SPINE IMPRESSION 1. Extensive edema/phlegmon throughout the subcutaneous soft tissues of the mid back consistent with known abscess in recent incision and drainage. No well-defined residual fluid collection or evidence of spinal infection identified on this motion degraded, unenhanced study. 2. Diffuse thoracic spondylosis with mild-to-moderate multilevel spinal stenosis in the mid and lower thoracic spine. MR LUMBAR SPINE IMPRESSION Lower lumbar disc and facet degeneration, worst at L4-5 where there is moderate spinal stenosis and bilateral neural foraminal stenosis. Electronically Signed   By: Logan Bores M.D.   On: 03/31/2016 20:44   Medications: I have reviewed the patient's current medications. Scheduled Meds: . amLODipine  10 mg Oral Daily  . doxycycline  100 mg Oral Q12H  . gabapentin  300 mg Oral BID  . heparin  5,000 Units Subcutaneous 3 times per day  . losartan  100 mg Oral Daily   And  . hydrochlorothiazide  25 mg Oral Daily  . insulin aspart  0-15 Units Subcutaneous TID WC  . insulin aspart  0-5 Units Subcutaneous QHS  . nystatin   Topical TID  . 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  MSSA back abscess with adjacent purulent cellulitis: This got drained 4/8 in the operating room. Cultures are growing MSSA. There appeared to be some adjacent erythema suggestive of additional purulent cellulitis so we'll continue her on doxycyline for another week. -Continue oral doxycycline for 2 week course (stop date 4/22) -Continue tramadol 50mg  every 4 hours as needed for pain -Consulted home health for dressing changes   Insulin-dependent type 2 diabetes: A1c looks good at 6.8 this admission. Her medications include Novolin 70/30 25 units twice daily. -Continue sliding scale insulin -Continue home gabapentin for neuropathy  History of recurrent pulmonary emboli: Given her progressively worsening renal function, we'll resume her on a lower do se of rivaroxaban upon discharge; she may need to be started on warfarin on an outpatient basis.  -Will resume rivaroxaban at 15mg  daily upon discharge -May need to change back to warfarin if her renal function continues to worsen  Hypertension: Pressures look good. -Continue home amlodipine 10mg  daily -Continue losartan/HCTZ 100-25mg  daily  Dispo: Discharge to home today.  The patient does have a current PCP [name], MD) and does need an [facility] hospital follow-up appointment after discharge.  The patient does have transportation limitations that hinder transportation to clinic appointments.  .Services Needed at time of discharge: Y = Yes, Blank = No PT:   OT:   RN:   Equipment:   Other:     LOS: 5 days   Loleta Chance, MD 04/01/2016, 8:19 AM

## 2016-04-01 NOTE — Progress Notes (Signed)
Pt c/o "pressure" and "burning" discomfort mid sternal chest pain x 1 hour. Pt has not verbalized pain until now. Pt denies any other s/sx distress. Pt states that she "hasn't had this pain before". Vital signs obtained. VSS. Dr. Beryle Beams and teaching service team in. Rept given to them. Per their order, mobilize pt to chair after obtaining 12 lead EKG.  Pt hasn't been up much this admission due to AMS related to morphine as per rept. Pt sat on side of bed this AM to eat breakfast and pt repts that she "didn't have pain at that time". Pt repts pain started when she laid back down. Will obtain EKG and mobilize pt to chair. Will continue to monitor.

## 2016-04-02 ENCOUNTER — Ambulatory Visit (HOSPITAL_COMMUNITY): Payer: Medicare Other | Attending: Internal Medicine

## 2016-04-02 LAB — CULTURE, BLOOD (ROUTINE X 2): Culture: NO GROWTH

## 2016-04-02 LAB — ANAEROBIC CULTURE

## 2016-04-05 ENCOUNTER — Emergency Department (HOSPITAL_COMMUNITY): Payer: Medicare Other

## 2016-04-05 ENCOUNTER — Encounter (HOSPITAL_COMMUNITY): Payer: Self-pay | Admitting: Emergency Medicine

## 2016-04-05 ENCOUNTER — Emergency Department (HOSPITAL_COMMUNITY)
Admission: EM | Admit: 2016-04-05 | Discharge: 2016-04-06 | Disposition: A | Discharge status: Home / Self Care | Payer: Medicare Other | Attending: Emergency Medicine | Admitting: Emergency Medicine

## 2016-04-05 DIAGNOSIS — Z794 Long term (current) use of insulin: Secondary | ICD-10-CM | POA: Diagnosis not present

## 2016-04-05 DIAGNOSIS — Y92009 Unspecified place in unspecified non-institutional (private) residence as the place of occurrence of the external cause: Secondary | ICD-10-CM | POA: Diagnosis not present

## 2016-04-05 DIAGNOSIS — Y9301 Activity, walking, marching and hiking: Secondary | ICD-10-CM | POA: Insufficient documentation

## 2016-04-05 DIAGNOSIS — M79604 Pain in right leg: Secondary | ICD-10-CM

## 2016-04-05 DIAGNOSIS — Z86711 Personal history of pulmonary embolism: Secondary | ICD-10-CM | POA: Diagnosis not present

## 2016-04-05 DIAGNOSIS — S8991XA Unspecified injury of right lower leg, initial encounter: Secondary | ICD-10-CM | POA: Diagnosis not present

## 2016-04-05 DIAGNOSIS — H269 Unspecified cataract: Secondary | ICD-10-CM | POA: Diagnosis not present

## 2016-04-05 DIAGNOSIS — G43909 Migraine, unspecified, not intractable, without status migrainosus: Secondary | ICD-10-CM | POA: Diagnosis not present

## 2016-04-05 DIAGNOSIS — Z79899 Other long term (current) drug therapy: Secondary | ICD-10-CM | POA: Insufficient documentation

## 2016-04-05 DIAGNOSIS — G8929 Other chronic pain: Secondary | ICD-10-CM | POA: Diagnosis not present

## 2016-04-05 DIAGNOSIS — Z7901 Long term (current) use of anticoagulants: Secondary | ICD-10-CM | POA: Diagnosis not present

## 2016-04-05 DIAGNOSIS — M25461 Effusion, right knee: Secondary | ICD-10-CM | POA: Diagnosis not present

## 2016-04-05 DIAGNOSIS — Y998 Other external cause status: Secondary | ICD-10-CM | POA: Insufficient documentation

## 2016-04-05 DIAGNOSIS — E7800 Pure hypercholesterolemia, unspecified: Secondary | ICD-10-CM | POA: Insufficient documentation

## 2016-04-05 DIAGNOSIS — R200 Anesthesia of skin: Secondary | ICD-10-CM | POA: Diagnosis not present

## 2016-04-05 DIAGNOSIS — Z87440 Personal history of urinary (tract) infections: Secondary | ICD-10-CM | POA: Diagnosis not present

## 2016-04-05 DIAGNOSIS — M25561 Pain in right knee: Secondary | ICD-10-CM

## 2016-04-05 DIAGNOSIS — F17210 Nicotine dependence, cigarettes, uncomplicated: Secondary | ICD-10-CM | POA: Diagnosis not present

## 2016-04-05 DIAGNOSIS — Z91040 Latex allergy status: Secondary | ICD-10-CM | POA: Insufficient documentation

## 2016-04-05 DIAGNOSIS — I10 Essential (primary) hypertension: Secondary | ICD-10-CM | POA: Insufficient documentation

## 2016-04-05 DIAGNOSIS — M25559 Pain in unspecified hip: Secondary | ICD-10-CM

## 2016-04-05 DIAGNOSIS — E119 Type 2 diabetes mellitus without complications: Secondary | ICD-10-CM | POA: Insufficient documentation

## 2016-04-05 DIAGNOSIS — Z8701 Personal history of pneumonia (recurrent): Secondary | ICD-10-CM | POA: Diagnosis not present

## 2016-04-05 DIAGNOSIS — S79911A Unspecified injury of right hip, initial encounter: Secondary | ICD-10-CM | POA: Diagnosis not present

## 2016-04-05 DIAGNOSIS — F329 Major depressive disorder, single episode, unspecified: Secondary | ICD-10-CM | POA: Insufficient documentation

## 2016-04-05 DIAGNOSIS — Z87442 Personal history of urinary calculi: Secondary | ICD-10-CM | POA: Insufficient documentation

## 2016-04-05 DIAGNOSIS — W108XXA Fall (on) (from) other stairs and steps, initial encounter: Secondary | ICD-10-CM | POA: Insufficient documentation

## 2016-04-05 DIAGNOSIS — G629 Polyneuropathy, unspecified: Secondary | ICD-10-CM | POA: Diagnosis not present

## 2016-04-05 DIAGNOSIS — S72009A Fracture of unspecified part of neck of unspecified femur, initial encounter for closed fracture: Secondary | ICD-10-CM

## 2016-04-05 LAB — COMPREHENSIVE METABOLIC PANEL
ALT: 14 U/L (ref 14–54)
AST: 21 U/L (ref 15–41)
Albumin: 1.6 g/dL — ABNORMAL LOW (ref 3.5–5.0)
Alkaline Phosphatase: 126 U/L (ref 38–126)
Anion gap: 11 (ref 5–15)
BUN: 15 mg/dL (ref 6–20)
CO2: 19 mmol/L — ABNORMAL LOW (ref 22–32)
Calcium: 8.1 mg/dL — ABNORMAL LOW (ref 8.9–10.3)
Chloride: 105 mmol/L (ref 101–111)
Creatinine, Ser: 1.68 mg/dL — ABNORMAL HIGH (ref 0.44–1.00)
GFR calc Af Amer: 36 mL/min — ABNORMAL LOW (ref >60)
GFR calc non Af Amer: 31 mL/min — ABNORMAL LOW (ref >60)
Glucose, Bld: 116 mg/dL — ABNORMAL HIGH (ref 65–99)
Potassium: 3.6 mmol/L (ref 3.5–5.1)
Sodium: 135 mmol/L (ref 135–145)
Total Bilirubin: 0.6 mg/dL (ref 0.3–1.2)
Total Protein: 6.7 g/dL (ref 6.5–8.1)

## 2016-04-05 LAB — TYPE AND SCREEN
ABO/RH(D): B POS
Antibody Screen: NEGATIVE

## 2016-04-05 LAB — CBC WITH DIFFERENTIAL/PLATELET
Basophils Absolute: 0 10*3/uL (ref 0.0–0.1)
Basophils Relative: 0 %
Eosinophils Absolute: 0.2 10*3/uL (ref 0.0–0.7)
Eosinophils Relative: 1 %
HCT: 29.9 % — ABNORMAL LOW (ref 36.0–46.0)
Hemoglobin: 9.6 g/dL — ABNORMAL LOW (ref 12.0–15.0)
Lymphocytes Relative: 13 %
Lymphs Abs: 2.1 10*3/uL (ref 0.7–4.0)
MCH: 25.9 pg — ABNORMAL LOW (ref 26.0–34.0)
MCHC: 32.1 g/dL (ref 30.0–36.0)
MCV: 80.8 fL (ref 78.0–100.0)
Monocytes Absolute: 1.5 10*3/uL — ABNORMAL HIGH (ref 0.1–1.0)
Monocytes Relative: 9 %
Neutro Abs: 12.4 10*3/uL — ABNORMAL HIGH (ref 1.7–7.7)
Neutrophils Relative %: 77 %
Platelets: 574 10*3/uL — ABNORMAL HIGH (ref 150–400)
RBC: 3.7 MIL/uL — ABNORMAL LOW (ref 3.87–5.11)
RDW: 18 % — ABNORMAL HIGH (ref 11.5–15.5)
WBC: 16.2 10*3/uL — ABNORMAL HIGH (ref 4.0–10.5)

## 2016-04-05 LAB — APTT: aPTT: 35 seconds (ref 24–37)

## 2016-04-05 LAB — ABO/RH: ABO/RH(D): B POS

## 2016-04-05 LAB — PROTIME-INR
INR: 1.27 (ref 0.00–1.49)
Prothrombin Time: 16.1 seconds — ABNORMAL HIGH (ref 11.6–15.2)

## 2016-04-05 MED ORDER — MORPHINE SULFATE (PF) 4 MG/ML IV SOLN
4.0000 mg | Freq: Once | INTRAVENOUS | Status: AC
  Administered 2016-04-05: 4 mg via INTRAVENOUS
  Filled 2016-04-05: qty 1

## 2016-04-05 MED ORDER — MORPHINE SULFATE (PF) 4 MG/ML IV SOLN
4.0000 mg | Freq: Once | INTRAVENOUS | Status: AC
Start: 2016-04-05 — End: 2016-04-05
  Administered 2016-04-05: 4 mg via INTRAVENOUS
  Filled 2016-04-05: qty 1

## 2016-04-05 NOTE — ED Notes (Signed)
Radiology to bring pt to treatment room.

## 2016-04-05 NOTE — ED Notes (Signed)
Received pt from home with c/o pt was ambulating up the stairs with a cane when her knees gave out causing her to fall up the steps. Pt c/o pain to right knee and right hip. Pt presents with swelling to right knee. No shortening of right leg noted. Pt has back surgery from previous fall 2 weeks ago.

## 2016-04-05 NOTE — ED Notes (Signed)
Per PA may hold I/O cath for urinalysis if patient is going to be discharged.

## 2016-04-05 NOTE — ED Provider Notes (Signed)
CSN: [id number]     Arrival date & time 04/05/16  1413 History   First MD Initiated Contact with Patient 04/05/16 1703     Chief Complaint  Patient presents with  . Fall  . Knee Pain  . Hip Pain     (Consider location/radiation/quality/duration/timing/severity/associated sxs/prior Treatment) HPI Comments: Patient is a 65 year old female who presents with right hip and knee pain following a fall this morning. Patient reports she was walking up steps with her cane and felt her legs giving out from under and the patient fell onto her side down 2 steps. Patient does not remember hitting her head or losing consciousness. Patient endorses numbness in her groin that is noted today. Patient has a history of urinary incontinence and had an episode of incontinence wall area in the room. Patient states she can hold her bowels and has not had a bowel movement in 10 days. Patient does not take pain medicine chronically but does take Tylenol #3 occasionally. Patient denies chest pain, shortness breath, abdominal pain, nausea, vomiting, headache. Patient is on Xarelto, history of PE.  Patient is a 65 y.o. female presenting with fall, knee pain, and hip pain. The history is provided by the patient.  Fall Associated symptoms include arthralgias. Pertinent negatives include no abdominal pain, chest pain, chills, fever, headaches, nausea, rash, sore throat or vomiting.  Knee Pain Associated symptoms: no back pain and no fever   Hip Pain Associated symptoms include arthralgias. Pertinent negatives include no abdominal pain, chest pain, chills, fever, headaches, nausea, rash, sore throat or vomiting.    Past Medical History  Diagnosis Date  . Hypertension   . Pulmonary embolism ([place])     2011 treated at [facility] in [place].  Was on Coumadin for over a year.  no known family history  . UTI (urinary tract infection)   . High cholesterol   . 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 Depression   .  Neuropathy ([facility])     feet  . Kidney stones   . Pneumonia     "several times" (07/26/2014)  . Type II diabetes mellitus ([place]) dx'd 2000  . Daily headache   . Migraine     "last one was in the 1990's" (07/26/2014)  . Arthritis     "knees" (07/26/2014)  . Chronic back pain   . Cataract of both eyes   . Hypertensive emergency 12/03/2015   Past Surgical History  Procedure Laterality Date  . Cholecystectomy      1980  . Esophagogastroduodenoscopy  08/19/2012    Procedure: ESOPHAGOGASTRODUODENOSCOPY (EGD);  Surgeon: Winfield Cunas., MD;  Location: [facility] ENDOSCOPY;  Service: Endoscopy;  Laterality: N/A;  . Cesarean section  1982  . Carpal tunnel release Bilateral   . Tonsillectomy    . Artery biopsy Right 05/09/2014    Procedure: BIOPSY TEMPORAL ARTERY;  Surgeon: Rosetta Posner, MD;  Location: [place];  Service: Vascular;  Laterality: Right;  . Appendectomy    . Vaginal hysterectomy    . Dilation and curettage of uterus  1982  . Cystoscopy w/ stone manipulation    . Breast biopsy Left   . Temporal artery biopsy / ligation Right 04/2014  . Eye surgery Bilateral   . Refractive surgery Bilateral   . Esophagogastroduodenoscopy (egd) with propofol N/A 12/06/2015    Procedure: ESOPHAGOGASTRODUODENOSCOPY (EGD) WITH PROPOFOL;  Surgeon: Wilford Corner, MD;  Location: [facility] ENDOSCOPY;  Service: Endoscopy;  Laterality: N/A;  . Colonoscopy  with propofol N/A 12/06/2015    Procedure: COLONOSCOPY WITH PROPOFOL;  Surgeon: Wilford Corner, MD;  Location: [facility] ENDOSCOPY;  Service: Endoscopy;  Laterality: N/A;  . Incision and drainage abscess N/A 03/28/2016    Procedure: INCISION, DRAINAGE  AND DEBRIDEMENT BACK  ABSCESS;  Surgeon: Greer Pickerel, MD;  Location: [place];  Service: General;  Laterality: N/A;   Family History  Problem Relation Age of Onset  . Adopted: Yes  . Hypertension Mother    Social History  Substance Use Topics  . Smoking status: Current Every Day Smoker -- 0.25 packs/day for 44 years    Types:  Cigarettes  . Smokeless tobacco: Never Used     Comment:  5 CIGARETTES A DAY or less  . Alcohol Use: No   OB History    No data available     Review of Systems  Constitutional: Negative for fever and chills.  HENT: Negative for facial swelling and sore throat.   Respiratory: Negative for shortness of breath.   Cardiovascular: Negative for chest pain.  Gastrointestinal: Negative for nausea, vomiting and abdominal pain.  Genitourinary: Negative for dysuria.  Musculoskeletal: Positive for arthralgias. Negative for back pain.  Skin: Negative for rash and wound.  Neurological: Negative for headaches.  Psychiatric/Behavioral: The patient is not nervous/anxious.       Allergies  Keflex; Clindamycin/lincomycin; Latex; and Sulfa antibiotics  Home Medications   Prior to Admission medications   Medication Sig Start Date End Date Taking? Authorizing Provider  amLODipine (NORVASC) 10 MG tablet TAKE 1 TABLET BY MOUTH EVERY DAY 10/23/15   Bartholomew Crews, MD  dicyclomine (BENTYL) 20 MG tablet Take 1 tablet (20 mg total) by mouth 4 (four) times daily. Patient taking differently: Take 20 mg by mouth daily as needed for spasms.  12/18/15   [name], MD  diphenoxylate-atropine (LOMOTIL) 2.5-0.025 MG tablet Take 1 tablet by mouth 4 (four) times daily as needed for diarrhea or loose stools. 03/07/16   Orpah Greek, MD  doxycycline (VIBRA-TABS) 100 MG tablet Take 1 tablet (100 mg total) by mouth every 12 (twelve) hours until 4/18 03/31/16   Loleta Chance, MD  gabapentin (NEURONTIN) 300 MG capsule TAKE 1 CAPSULE BY MOUTH THREE TIMES A DAY 09/25/15   [name], MD  insulin NPH-regular Human (NOVOLIN 70/30) (70-30) 100 UNIT/ML injection Inject 25 Units into the skin 2 (two) times daily with a meal. 03/31/16   Loleta Chance, MD  loratadine (CLARITIN) 10 MG tablet Take 1 tablet (10 mg total) by mouth daily. Patient taking differently: Take 10 mg by mouth daily as needed for allergies or  rhinitis.  08/19/15 08/18/16  [name], MD  losartan-hydrochlorothiazide (HYZAAR) 100-25 MG tablet TAKE 1 TABLET BY MOUTH EVERY DAY 03/26/16   [name], MD  pravastatin (PRAVACHOL) 40 MG tablet TAKE 1 TABLET BY MOUTH EVERY EVENING 01/20/16   [name], MD  Rivaroxaban (XARELTO) 15 MG TABS tablet Take 1 tablet (15 mg total) by mouth daily with supper. 03/09/16   Zada Finders, MD  traMADol (ULTRAM) 50 MG tablet Take 1 tablet (50 mg total) by mouth every 6 (six) hours as needed for moderate pain or severe pain. 03/31/16   Loleta Chance, MD  traZODone (DESYREL) 150 MG tablet TAKE 1 TABLET BY MOUTH EVERY NIGHT AT BEDTIME 12/11/15   [name], MD   BP 160/79 mmHg  Pulse 86  Temp(Src) 98.1 F (36.7 C) (Oral)  Resp 16  Ht 5\' 9"  (1.753 m)  Wt 90.719 kg  BMI 29.52 kg/m2  SpO2 97% Physical Exam  Constitutional: She appears well-developed and well-nourished. No distress.  HENT:  Head: Normocephalic and atraumatic.  Eyes: Conjunctivae are normal. Pupils are equal, round, and reactive to light. Right eye exhibits no discharge. Left eye exhibits no discharge. No scleral icterus.  Neck: Normal range of motion. Neck supple. No thyromegaly present.  Cardiovascular: Normal rate, regular rhythm, normal heart sounds and intact distal pulses.  Exam reveals no gallop and no friction rub.   No murmur heard. Pulmonary/Chest: Effort normal and breath sounds normal. No stridor. No respiratory distress. She has no wheezes. She has no rales.  Abdominal: Soft. Bowel sounds are normal. She exhibits no distension. There is no tenderness. There is no rebound and no guarding.  Musculoskeletal: She exhibits no edema.       Right hip: She exhibits decreased range of motion, decreased strength, tenderness and bony tenderness.       Right knee: She exhibits decreased range of motion, swelling and effusion. Tenderness found.       Legs: Cap refill less than 2 seconds on the right foot; patient has neuropathy at  baseline and had decreased sensation of right foot and leg; patient unable to move leg off the bed with good effort; 5/5 strength on left hip flexor; no shortening of right leg;  Lymphadenopathy:    She has no cervical adenopathy.  Neurological: She is alert. Coordination normal.  Skin: Skin is warm and dry. No rash noted. She is not diaphoretic. No pallor.  Psychiatric: She has a normal mood and affect.  Nursing note and vitals reviewed.   ED Course  Procedures (including critical care time) Labs Review Labs Reviewed  CBC WITH DIFFERENTIAL/PLATELET - Abnormal; Notable for the following:    WBC 16.2 (*)    RBC 3.70 (*)    Hemoglobin 9.6 (*)    HCT 29.9 (*)    MCH 25.9 (*)    RDW 18.0 (*)    Platelets 574 (*)    Neutro Abs 12.4 (*)    Monocytes Absolute 1.5 (*)    All other components within normal limits  PROTIME-INR - Abnormal; Notable for the following:    Prothrombin Time 16.1 (*)    All other components within normal limits  COMPREHENSIVE METABOLIC PANEL - Abnormal; Notable for the following:    CO2 19 (*)    Glucose, Bld 116 (*)    Creatinine, Ser 1.68 (*)    Calcium 8.1 (*)    Albumin 1.6 (*)    GFR calc non Af Amer 31 (*)    GFR calc Af Amer 36 (*)    All other components within normal limits  APTT  URINALYSIS, ROUTINE W REFLEX MICROSCOPIC (NOT AT [facility])  TYPE AND SCREEN  ABO/RH    Imaging Review Dg Chest 1 View  04/05/2016  CLINICAL DATA:  Recent fall with right hip pain and chest discomfort EXAM: CHEST 1 VIEW COMPARISON:  03/27/2016 FINDINGS: Cardiac shadow is at the upper limits of normal in size. Multiple small radiopaque densities are noted overlying the chest and extending beyond the chest wall consistent with extrinsic artifact. This was not present on the prior exam. No focal infiltrate or sizable effusion is noted. IMPRESSION: No acute abnormality noted. Electronically Signed   By: Inez Catalina M.D.   On: 04/05/2016 16:09   Ct Head Wo Contrast  04/05/2016   CLINICAL DATA:  Patient status post fall. No reported loss of consciousness. EXAM: CT HEAD WITHOUT  CONTRAST TECHNIQUE: Contiguous axial images were obtained from the base of the skull through the vertex without intravenous contrast. COMPARISON:  Brain CT 08/09/2012 FINDINGS: Ventricles and sulci are appropriate for patient's age. Periventricular and subcortical white matter hypodensity compatible with chronic microvascular ischemic changes. No evidence for acute cortically based infarct, intracranial hemorrhage, mass lesion or mass-effect. Paranasal sinuses are well aerated. Mastoid air cells unremarkable. Calvarium is intact. IMPRESSION: No acute intracranial process. Chronic microvascular ischemic changes. Electronically Signed   By: Lovey Newcomer M.D.   On: 04/05/2016 19:51   Ct Hip Right Wo Contrast  04/05/2016  CLINICAL DATA:  Right hip pain after falling onto concrete. History of urinary tract infection and pulmonary embolism. Initial encounter. EXAM: CT OF THE RIGHT HIP WITHOUT CONTRAST TECHNIQUE: Multidetector CT imaging of the right hip was performed according to the standard protocol. Multiplanar CT image reconstructions were also generated. COMPARISON:  Radiograph same date.  CT 03/07/2016. FINDINGS: This examination is limited to the right hip and right inferior hemipelvis. The entire pelvis is not imaged. The bones are adequately mineralized. There is no evidence of acute fracture or dislocation. There are mild degenerative changes at the right hip joint. The sacroiliac joints are not imaged. There is mild subcutaneous edema within the proximal thigh. No focal hematoma identified. There is moderate femoral artery atherosclerosis. The visualized internal pelvic contents appear unremarkable. IMPRESSION: 1. No acute findings are demonstrated. There is no evidence of proximal right femur fracture or dislocation. 2. Mild degenerative changes at the right hip. 3. Moderate atherosclerosis. Electronically  Signed   By: Richardean Sale M.D.   On: 04/05/2016 18:58   Dg Knee Complete 4 Views Right  04/05/2016  CLINICAL DATA:  Recent fall with painful right knee, initial encounter EXAM: RIGHT KNEE - COMPLETE 4+ VIEW COMPARISON:  None. FINDINGS: Degenerative changes are noted in all 3 joint compartments. The joint space narrowing is worst in the lateral joint compartment. Multiple well corticated loose bodies are noted posteriorly within the joint. Small joint effusion is noted. No acute fracture or dislocation is seen. IMPRESSION: Degenerative changes with small joint effusion. No acute fracture is seen. Electronically Signed   By: Inez Catalina M.D.   On: 04/05/2016 16:06   Dg Hip Unilat  With Pelvis 2-3 Views Right  04/05/2016  CLINICAL DATA:  Fall this morning with right hip pain, initial encounter EXAM: DG HIP (WITH OR WITHOUT PELVIS) 2-3V RIGHT COMPARISON:  03/19/2015 FINDINGS: Pelvic ring is intact. Proximal femur is well visualized. Mild irregularity is noted in the subcapital femoral neck suspicious for an undisplaced fracture. CT may be helpful for further evaluation. No gross soft tissue abnormality is seen. Degenerative changes of lumbar spine are noted. IMPRESSION: Irregularity in the subcapital right femoral neck suspicious for undisplaced fracture. CT is recommended for further evaluation. Electronically Signed   By: Inez Catalina M.D.   On: 04/05/2016 16:05   I have personally reviewed and evaluated these images and lab results as part of my medical decision-making.   EKG Interpretation None      9:00pm Patient unwilling to get up and walk; Patient evaluated by Dr. Billy Fischer and having increasing tenderness to right knee as well as middle to inferior thigh; femur x-ray ordered to fill in gaps between knee and hip imaging; if negative, suspect ligamentous injury to right knee  MDM   CT Head shows no intracranial process. CT Hip with Pelvis 2-3 R shows no acute findings; no evidence of  proximal R femur  fracture or dislocation. R hip with pelvis xray showed Irregularity in the subcapital right femoral neck suspicious for undisplaced fracture. Right knee x-ray shows degenerative changes with small joint effusion, no acute fracture. Chest x-ray shows no acute abnormality. CBC is unchanged. CBC shows creatinine 1.68, calcium 8.1, albumin 1.6, CO2 19, glucose 116. PT 16.1, INR 1.27. Suspect ligamentous injury to right knee. Right femur x-ray pending. At shift change, patient care transferred to  Dr. Billy Fischer for continued evaluation, follow up of R femus x-ray and determination of disposition. Anticipate discharge if femur xray negative, patient's pain controlled, and able to ambulate with knee sleeve and walker.   Final diagnoses:  Hip pain  Leg pain, anterior, right  Knee pain, acute, right       Frederica Kuster, PA-C 04/05/16 2221  Gareth Morgan, MD 04/07/16 1815

## 2016-04-05 NOTE — Discharge Instructions (Signed)
Medications: Percocet  Treatment: Take Percocet every 4-6 hours as needed for your pain. Ice your knee 3-4 times per day, 20 min on, 20 min off. Wear your knee sleeve all the time, with the exception of bathing. You may bear weight as tolerated.  Follow-up: Follow-up with Dr. Debroah Loop office for further evaluation of your knee and hip pain. Please return to emergency department if you develop any new or worsening symptoms.   Knee Pain Knee pain is a very common symptom and can have many causes. Knee pain often goes away when you follow your health care provider's instructions for relieving pain and discomfort at home. However, knee pain can develop into a condition that needs treatment. Some conditions may include:  Arthritis caused by wear and tear (osteoarthritis).  Arthritis caused by swelling and irritation (rheumatoid arthritis or gout).  A cyst or growth in your knee.  An infection in your knee joint.  An injury that will not heal.  Damage, swelling, or irritation of the tissues that support your knee (torn ligaments or tendinitis). If your knee pain continues, additional tests may be ordered to diagnose your condition. Tests may include X-rays or other imaging studies of your knee. You may also need to have fluid removed from your knee. Treatment for ongoing knee pain depends on the cause, but treatment may include:  Medicines to relieve pain or swelling.  Steroid injections in your knee.  Physical therapy.  Surgery. HOME CARE INSTRUCTIONS  Take medicines only as directed by your health care provider.  Rest your knee and keep it raised (elevated) while you are resting.  Do not do things that cause or worsen pain.  Avoid high-impact activities or exercises, such as running, jumping rope, or doing jumping jacks.  Apply ice to the knee area:  Put ice in a plastic bag.  Place a towel between your skin and the bag.  Leave the ice on for 20 minutes, 2-3 times a  day.  Ask your health care provider if you should wear an elastic knee support.  Keep a pillow under your knee when you sleep.  Lose weight if you are overweight. Extra weight can put pressure on your knee.  Do not use any tobacco products, including cigarettes, chewing tobacco, or electronic cigarettes. If you need help quitting, ask your health care provider. Smoking may slow the healing of any bone and joint problems that you may have. SEEK MEDICAL CARE IF:  Your knee pain continues, changes, or gets worse.  You have a fever along with knee pain.  Your knee buckles or locks up.  Your knee becomes more swollen. SEEK IMMEDIATE MEDICAL CARE IF:   Your knee joint feels hot to the touch.  You have chest pain or trouble breathing.   This information is not intended to replace advice given to you by your health care provider. Make sure you discuss any questions you have with your health care provider.   Document Released: 10/04/2007 Document Revised: 12/28/2014 Document Reviewed: 07/23/2014 Elsevier Interactive Patient Education [employer].

## 2016-04-06 LAB — URINALYSIS, ROUTINE W REFLEX MICROSCOPIC
Bilirubin Urine: NEGATIVE
Glucose, UA: 250 mg/dL — AB
Ketones, ur: NEGATIVE mg/dL
Leukocytes, UA: NEGATIVE
Nitrite: NEGATIVE
Protein, ur: >300 mg/dL — AB
Specific Gravity, Urine: 1.017 (ref 1.005–1.030)
pH: 7 (ref 5.0–8.0)

## 2016-04-06 LAB — URINE MICROSCOPIC-ADD ON

## 2016-04-06 MED ORDER — HYDROCODONE-ACETAMINOPHEN 5-325 MG PO TABS
2.0000 | ORAL_TABLET | Freq: Once | ORAL | Status: AC
Start: 2016-04-06 — End: 2016-04-06
  Administered 2016-04-06: 2 via ORAL
  Filled 2016-04-06: qty 2

## 2016-04-06 MED ORDER — OXYCODONE-ACETAMINOPHEN 5-325 MG PO TABS: 2.0000 | ORAL_TABLET | ORAL | Status: DC | PRN

## 2016-04-06 NOTE — ED Notes (Signed)
Pt stable, states understanding of discharge instructions 

## 2016-04-07 ENCOUNTER — Telehealth: Payer: Self-pay | Admitting: Internal Medicine

## 2016-04-07 NOTE — Telephone Encounter (Signed)
APPT. REMINDER CALL, NO ANSWER, NO VOICE MAIL °

## 2016-04-09 ENCOUNTER — Ambulatory Visit: Payer: Medicare Other | Admitting: Internal Medicine

## 2016-04-09 ENCOUNTER — Encounter: Payer: Self-pay | Admitting: Internal Medicine

## 2016-04-14 ENCOUNTER — Telehealth: Payer: Self-pay | Admitting: *Deleted

## 2016-04-14 ENCOUNTER — Encounter: Payer: Self-pay | Admitting: Internal Medicine

## 2016-04-14 ENCOUNTER — Ambulatory Visit (INDEPENDENT_AMBULATORY_CARE_PROVIDER_SITE_OTHER): Payer: Medicare Other | Admitting: Internal Medicine

## 2016-04-14 VITALS — BP 139/63 | HR 94 | Temp 97.7°F | Ht 69.0 in | Wt 212.2 lb

## 2016-04-14 DIAGNOSIS — L02212 Cutaneous abscess of back [any part, except buttock]: Secondary | ICD-10-CM

## 2016-04-14 DIAGNOSIS — I2699 Other pulmonary embolism without acute cor pulmonale: Secondary | ICD-10-CM | POA: Diagnosis not present

## 2016-04-14 DIAGNOSIS — B9689 Other specified bacterial agents as the cause of diseases classified elsewhere: Secondary | ICD-10-CM | POA: Diagnosis not present

## 2016-04-14 DIAGNOSIS — N179 Acute kidney failure, unspecified: Secondary | ICD-10-CM | POA: Diagnosis not present

## 2016-04-14 DIAGNOSIS — Z7901 Long term (current) use of anticoagulants: Secondary | ICD-10-CM

## 2016-04-14 MED ORDER — DOXYCYCLINE HYCLATE 100 MG PO CAPS: 100.0000 mg | ORAL_CAPSULE | Freq: Two times a day (BID) | ORAL | Status: DC

## 2016-04-14 MED ORDER — OXYCODONE-ACETAMINOPHEN 5-325 MG PO TABS: 1.0000 | ORAL_TABLET | Freq: Four times a day (QID) | ORAL | Status: DC | PRN

## 2016-04-14 NOTE — Progress Notes (Signed)
Patient ID: Joy Butler, female   DOB: 1951-02-09, 65 y.o.   MRN: [id number]     Subjective:   Patient ID: Joy Butler female    DOB: 12/26/1950 65 y.o.    MRN: [id number] Health Maintenance Due: Health Maintenance Due  Topic Date Due  . ZOSTAVAX  09/06/2009  . DEXA SCAN  09/06/2014  . MAMMOGRAM  02/07/2015  . LIPID PANEL  08/01/2015  . OPHTHALMOLOGY EXAM  01/10/2016  . FOOT EXAM  04/07/2016    _________________________________________________  HPI: [name] is a 65 y.o. female here for hospital f/u following an extensive abscess in her mid-back.  Pt has a PMH outlined below.  Please see problem-based charting assessment and plan for further status of patient's chronic medical problems addressed at today's visit.  PMH: Past Medical History  Diagnosis Date  . Hypertension   . Pulmonary embolism ([place])     2011 treated at [facility] in [place].  Was on Coumadin for over a year.  no known family history  . UTI (urinary tract infection)   . High cholesterol   . 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 Depression   . Neuropathy ([facility])     feet  . Kidney stones   . Pneumonia     "several times" (07/26/2014)  . Type II diabetes mellitus ([place]) dx'd 2000  . Daily headache   . Migraine     "last one was in the 1990's" (07/26/2014)  . Arthritis     "knees" (07/26/2014)  . Chronic back pain   . Cataract of both eyes   . Hypertensive emergency 12/03/2015    Medications: Current Outpatient Prescriptions on File Prior to Visit  Medication Sig Dispense Refill  . amLODipine (NORVASC) 10 MG tablet TAKE 1 TABLET BY MOUTH EVERY DAY 30 tablet 11  . dicyclomine (BENTYL) 20 MG tablet Take 1 tablet (20 mg total) by mouth 4 (four) times daily. (Patient taking differently: Take 20 mg by mouth daily as needed for spasms. ) 120 tablet 3  . diphenoxylate-atropine (LOMOTIL) 2.5-0.025 MG tablet Take 1 tablet by mouth 4 (four) times daily as needed for diarrhea or loose  stools. 30 tablet 0  . gabapentin (NEURONTIN) 300 MG capsule TAKE 1 CAPSULE BY MOUTH THREE TIMES A DAY 180 capsule 2  . insulin NPH-regular Human (NOVOLIN 70/30) (70-30) 100 UNIT/ML injection Inject 25 Units into the skin 2 (two) times daily with a meal. 30 mL 1  . loratadine (CLARITIN) 10 MG tablet Take 1 tablet (10 mg total) by mouth daily. (Patient taking differently: Take 10 mg by mouth daily as needed for allergies or rhinitis. ) 30 tablet 2  . losartan-hydrochlorothiazide (HYZAAR) 100-25 MG tablet TAKE 1 TABLET BY MOUTH EVERY DAY 90 tablet 1  . pravastatin (PRAVACHOL) 40 MG tablet TAKE 1 TABLET BY MOUTH EVERY EVENING 90 tablet 1  . Rivaroxaban (XARELTO) 15 MG TABS tablet Take 1 tablet (15 mg total) by mouth daily with supper. 60 tablet 0  . traZODone (DESYREL) 150 MG tablet TAKE 1 TABLET BY MOUTH EVERY NIGHT AT BEDTIME 30 tablet 4   No current facility-administered medications on file prior to visit.    Allergies: Allergies  Allergen Reactions  . Keflex [Cephalexin] Itching and Swelling    Lips and eyes swelled up  . Clindamycin/Lincomycin Itching  . Latex Itching  . Sulfa Antibiotics Rash    FH: Family History  Problem Relation Age of Onset  . Adopted:  Yes  . Hypertension Mother     SH: Social History   Social History  . Marital Status: Single    Spouse Name: N/A  . Number of Children: N/A  . Years of Education: N/A   Social History Main Topics  . Smoking status: Current Every Day Smoker -- 0.25 packs/day for 44 years    Types: Cigarettes  . Smokeless tobacco: Never Used     Comment:  5 CIGARETTES A DAY or less  . Alcohol Use: No  . Drug Use: No  . Sexual Activity: Yes   Other Topics Concern  . Not on file   Social History Narrative   Previously divorced now engaged with her partner of 4 years.  Has 6 grown children with 21 grandchildren.  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  Moved to [place] in August 2013 and was  transiently homeless until first part of October 2013.      Review of Systems: Constitutional: Negative for fever, chills.  Eyes: Negative for blurred vision.  Respiratory: Negative for cough and shortness of breath.  Cardiovascular: Negative for chest pain.  Gastrointestinal: Negative for nausea, vomiting. Neurological: Negative for dizziness.   Objective:   Vital Signs: Filed Vitals:   04/14/16 0913 04/14/16 0914  BP:  139/63  Pulse:  94  Temp: 97.7 F (36.5 C)   TempSrc: Oral   Height: 5\' 9"  (1.753 m)   Weight: 212 lb 3.2 oz (96.253 kg)   SpO2:  100%      BP Readings from Last 3 Encounters:  04/14/16 139/63  04/06/16 173/61  04/01/16 165/68    Physical Exam: Constitutional: Vital signs reviewed.  Patient is in NAD and cooperative with exam.  Head: Normocephalic and atraumatic. Eyes: EOMI, conjunctivae nl, no scleral icterus.  Neck: Supple. Cardiovascular: RRR. Pulmonary/Chest: Normal effort, CTAB; thoracic back with wound that is draining some purulent fluid, with granulation tissue.  Drain in place.   Abdominal: Soft. NT/ND +BS. Neurological: A&O x3, cranial nerves II-XII are grossly intact, moving all extremities.   Assessment & Plan:   Assessment and plan was discussed and formulated with my attending.

## 2016-04-14 NOTE — Assessment & Plan Note (Addendum)
Pt here for f/u of a mid-back abscess.  She was discharged on 4/11 following an extensive abscess (4.7x2x2.4cm) and underwent I&D, received IV abx, and was transitioned to oral doxycycline for a 14 day course.  At the time of discharge she had packing of the distal aspect of the linear wound on her back.  She also had a penrose drain.  She was to receive twice daily dressing changes at home from [facility].  She declined going to an facility for a short time. She was to f/u with surgery (Dr. Greer Pickerel) following discharge but states they do not know anything about this appointment.  She was supposed to be receiving home services for dressing changes but the husband has been doing this because no one came.  She did also not get her antibiotics stating they went to the wrong pharmacy.  Today, the wound is dressed with some purulent drainage coming from the wound.  We unpacked the wound and observed some granulation tissue at the base.  We repacked two separate wounds.  Caryl Pina called [facility] and apparently they had been trying to reach the patient to come out for dressing changes.  They attempted to call 7 times and were unable to make contact with the patient.   -made appointment for f/u with Dr. Greer Pickerel (general surgery) and was given to her by Ulis Rias -also sent doxycyline to correct pharmacy ([facility]) -refilled percocet #20 without refills -will have Caryl Pina call pt in the PM to make sure they are aware that [facility] has been trying to get in touch with them -f/u in 1 month with PCP

## 2016-04-14 NOTE — Addendum Note (Signed)
Addended by: Jones Bales on: 04/14/2016 04:42 PM   Modules accepted: Orders

## 2016-04-14 NOTE — Telephone Encounter (Signed)
Pt's sig other calls and states he spoke w/ [facility] today and they need a new referral order and nursing orders so they can do visits the original has expired, please have your nurse to assist you

## 2016-04-14 NOTE — Patient Instructions (Addendum)
Thank you for your visit today.  Please be sure to keep your appointment with the general surgeon tomorrow, Dr. Redmond Pulling. I have sent your antibiotics (doxycycline) to [facility].  Please take twice daily for 7 days. I will also have the home health come out to help you with dressing changes. I have also given you a short supply of pain medication.  Please return to the internal medicine clinic in about 3 weeks or sooner if needed.      Please be sure to bring all of your medications with you to every visit; this includes herbal supplements, vitamins, eye drops, and any over-the-counter medications.   Should you have any questions regarding your medications and/or any new or worsening symptoms, please be sure to call the clinic at [phone].   If you believe that you are suffering from a life threatening condition or one that may result in the loss of limb or function, then you should call 911 and proceed to the nearest Emergency Department.

## 2016-04-14 NOTE — Assessment & Plan Note (Signed)
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 -repeat BMP

## 2016-04-15 ENCOUNTER — Telehealth: Payer: Self-pay | Admitting: Licensed Clinical Social Worker

## 2016-04-15 NOTE — Telephone Encounter (Signed)
CSW placed call to Ms. Mauthe as pt has a referral for home health wound care. Pt states she has used [facility] earlier this year.  Pt notified referral will be sent to [facility], if they are unable to accept will forward to a [employer] network providers.  Pt agreeable.  HH network: [facility], [place] plus add'l agencies available. Call placed to [facility] and was instructed to fax referral to [phone].  Referral faxed.

## 2016-04-15 NOTE — Progress Notes (Signed)
I saw and evaluated the patient. I personally confirmed the key portions of Dr. Ferd Glassing history and exam and reviewed pertinent patient test results. The assessment, diagnosis, and plan were formulated together and I agree with the documentation in the resident's note.  I repacked the wounds and they looked surprisingly good.  There was some bleeding at the base of both wounds when I removed the guaze packing suggesting good blood flow to the wounds.  Apparently there was a communication breakdown at discharge.  We will try to correct this and give patient specific instructions that should be actionable as outlined in Dr. Ferd Glassing note.
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
## 2016-04-21 ENCOUNTER — Other Ambulatory Visit: Payer: Self-pay | Admitting: Internal Medicine

## 2016-04-21 NOTE — Telephone Encounter (Signed)
[facility] has contacted patient and left 7 messages with no response. Unable to contact so encounter was closed. Doctor aware.

## 2016-04-21 NOTE — Telephone Encounter (Signed)
See note above

## 2016-04-22 NOTE — Telephone Encounter (Signed)
Requesting Tylenol #3 refill. This removed from med list 04/01/2016. Percocet 5/325 mg. #20 was added 04/14/2016. Last appointment 04/14/2016. No recent UDS. Next appointment 05/05/2016.

## 2016-04-24 ENCOUNTER — Telehealth: Payer: Self-pay | Admitting: Student-PharmD

## 2016-04-24 NOTE — Telephone Encounter (Signed)
Per Dr Ferd Glassing last note, percoset was refilled with no refills. Also, per last discharge summary, tylenol # 3 was stopped.  Pt also does not have a pain contract here and no last UDS. I am not comfortable refilling this medication. She needs an appt to discuss pain med regimen. And I think next appt is 5/16. Pl ask the pt is she has the pain medicine to last until the appt in 5/16- I can prescribe the same for a week, but can not prescribe it for long term duration without knowing of the reason behind it.   Thanks, [employer]

## 2016-04-24 NOTE — Telephone Encounter (Signed)
Joy Butler is a 65 y.o. female who was contacted via telephone for monitoring of rivaroxaban (Xarelto) therapy.    ASSESSMENT Indication(s): PE Duration: indefinite  Labs:    Component Value Date/Time   AST 21 04/05/2016 2013   ALT 14 04/05/2016 2013   NA 135 04/05/2016 2013   NA 141 03/20/2016 1523   K 3.6 04/05/2016 2013   CL 105 04/05/2016 2013   CO2 19* 04/05/2016 2013   GLUCOSE 116* 04/05/2016 2013   GLUCOSE 68 03/20/2016 1523   HGBA1C 6.8* 03/29/2016 0722   HGBA1C 7.2 01/01/2016 1028   BUN 15 04/05/2016 2013   BUN 19 03/20/2016 1523   CREATININE 1.68* 04/05/2016 2013   CREATININE 1.27* 07/17/2015 1653   CALCIUM 8.1* 04/05/2016 2013   GFRAA 36* 04/05/2016 2013   GFRAA 51* 07/17/2015 1653   WBC 16.2* 04/05/2016 2013   HGB 9.6* 04/05/2016 2013   HCT 29.9* 04/05/2016 2013   PLT 574* 04/05/2016 2013    rivaroxaban (Xarelto) Dose: According to the patient, she was instructed to stop Xarelto following her hospital admission.   Patient Instructions: Patient advised to discuss continuation of therapy with physician at clinic appointment next week.   Recommendations:   Confirm if patient is still indicated for Xarelto therapy, possibly due to history of recurrent PE.   If continuing therapy, will need to monitor patient's renal function for potential dose adjustments. CrCl currently 50.2 mL/min. If renal function improves, will need to adjust Xarelto dose to 20 mg daily. If CrCl remains between 30-50 mL/min, dose can be maintained at 15 mg daily.   Follow-up Clinic appointment scheduled for 05/05/16.   [employer] Student  04/24/2016, 2:07 PM

## 2016-05-01 NOTE — Telephone Encounter (Signed)
Patient was contacted with Marylou Flesher, PharmD candidate. I agree with the assessment and plan of care documented.

## 2016-05-04 ENCOUNTER — Telehealth: Payer: Self-pay | Admitting: Internal Medicine

## 2016-05-04 NOTE — Addendum Note (Signed)
Addended by: Hulan Fray on: 05/04/2016 07:44 AM   Modules accepted: Orders

## 2016-05-04 NOTE — Telephone Encounter (Signed)
APT. REMINDER CALL, LMTCB °

## 2016-05-05 ENCOUNTER — Encounter: Payer: Self-pay | Admitting: Internal Medicine

## 2016-05-05 ENCOUNTER — Ambulatory Visit: Payer: Medicare Other | Admitting: Internal Medicine

## 2016-05-22 ENCOUNTER — Other Ambulatory Visit: Payer: Self-pay | Admitting: Internal Medicine

## 2016-05-25 NOTE — Telephone Encounter (Signed)
Pt needs an appt to discuss use of pain medications and referral to pain clinic if deemed necessary

## 2016-06-16 ENCOUNTER — Telehealth: Payer: Self-pay

## 2016-06-16 NOTE — Telephone Encounter (Addendum)
Joy Butler is a 65 y.o. female who was contacted via telephone for monitoring of rivaroxaban (Xarelto) therapy.    ASSESSMENT Indication(s): recurrent PE Duration: indefinite  Labs:    Component Value Date/Time   AST 21 04/05/2016 2013   ALT 14 04/05/2016 2013   NA 135 04/05/2016 2013   NA 141 03/20/2016 1523   K 3.6 04/05/2016 2013   CL 105 04/05/2016 2013   CO2 19* 04/05/2016 2013   GLUCOSE 116* 04/05/2016 2013   GLUCOSE 68 03/20/2016 1523   HGBA1C 6.8* 03/29/2016 0722   HGBA1C 7.2 01/01/2016 1028   BUN 15 04/05/2016 2013   BUN 19 03/20/2016 1523   CREATININE 1.68* 04/05/2016 2013   CREATININE 1.27* 07/17/2015 1653   CALCIUM 8.1* 04/05/2016 2013   GFRNONAA 31* 04/05/2016 2013   GFRNONAA 44* 07/17/2015 1653   GFRAA 36* 04/05/2016 2013   GFRAA 51* 07/17/2015 1653   WBC 16.2* 04/05/2016 2013   HGB 9.6* 04/05/2016 2013   HCT 29.9* 04/05/2016 2013   PLT 574* 04/05/2016 2013    rivaroxaban (Xarelto) Dose: 15 mg BID  Adherence: Patient reports adherence challenges. She states her doctor told her to stop taking Xarelto and has not been taking the medication.  Per pharmacy, last fills (30 day supply): 4/26, 3/31, 3/7, 2/17.  Patient Instructions: Patient advised to contact clinic or seek medical attention if signs/symptoms of bleeding or thromboembolism occur. Patient verbalized understanding by repeating back information.  Follow-up No follow up appointment scheduled.  Angelena Form PharmD Candidate  06/17/2016, 3:24 PM

## 2016-06-17 ENCOUNTER — Telehealth: Payer: Self-pay | Admitting: *Deleted

## 2016-06-17 NOTE — Telephone Encounter (Signed)
Called 6/27, no answer. Called today, offered aapt this pm refused due to transportation, will call if cancellation for tomorrow or friday

## 2016-06-22 ENCOUNTER — Ambulatory Visit: Payer: Medicare Other

## 2016-06-30 ENCOUNTER — Other Ambulatory Visit: Payer: Self-pay | Admitting: Internal Medicine

## 2016-07-01 NOTE — Telephone Encounter (Signed)
Tylenol #3 was started on 12/03/2015 by Dr. Evette Doffing for HA in ED. Per Dr. Tiburcio Pea patient was to stop taking this at discharge from separate ED visit on 04/01/2016. Previously has been refused, pending appointment. Patient cancelled appointment 06/22/2016, but has a new one for 07/06/2016.

## 2016-07-01 NOTE — Telephone Encounter (Signed)
Refill given for gabapentin -I can not refill tylenol # 3- Must keep appt

## 2016-07-06 ENCOUNTER — Ambulatory Visit (INDEPENDENT_AMBULATORY_CARE_PROVIDER_SITE_OTHER): Payer: Medicare Other | Admitting: Internal Medicine

## 2016-07-06 ENCOUNTER — Encounter: Payer: Self-pay | Admitting: Internal Medicine

## 2016-07-06 VITALS — BP 122/66 | HR 91 | Temp 98.2°F | Wt 206.4 lb

## 2016-07-06 DIAGNOSIS — Z9114 Patient's other noncompliance with medication regimen: Secondary | ICD-10-CM

## 2016-07-06 DIAGNOSIS — Z7901 Long term (current) use of anticoagulants: Secondary | ICD-10-CM | POA: Diagnosis not present

## 2016-07-06 DIAGNOSIS — I2699 Other pulmonary embolism without acute cor pulmonale: Secondary | ICD-10-CM

## 2016-07-06 DIAGNOSIS — Z794 Long term (current) use of insulin: Secondary | ICD-10-CM

## 2016-07-06 DIAGNOSIS — I10 Essential (primary) hypertension: Secondary | ICD-10-CM

## 2016-07-06 DIAGNOSIS — R5382 Chronic fatigue, unspecified: Secondary | ICD-10-CM | POA: Diagnosis not present

## 2016-07-06 DIAGNOSIS — E119 Type 2 diabetes mellitus without complications: Secondary | ICD-10-CM

## 2016-07-06 DIAGNOSIS — F17210 Nicotine dependence, cigarettes, uncomplicated: Secondary | ICD-10-CM

## 2016-07-06 DIAGNOSIS — Z79899 Other long term (current) drug therapy: Secondary | ICD-10-CM

## 2016-07-06 DIAGNOSIS — I2782 Chronic pulmonary embolism: Secondary | ICD-10-CM

## 2016-07-06 DIAGNOSIS — E1140 Type 2 diabetes mellitus with diabetic neuropathy, unspecified: Secondary | ICD-10-CM

## 2016-07-06 DIAGNOSIS — R5383 Other fatigue: Secondary | ICD-10-CM | POA: Insufficient documentation

## 2016-07-06 DIAGNOSIS — Z0000 Encounter for general adult medical examination without abnormal findings: Secondary | ICD-10-CM

## 2016-07-06 DIAGNOSIS — E7800 Pure hypercholesterolemia, unspecified: Secondary | ICD-10-CM | POA: Diagnosis not present

## 2016-07-06 LAB — GLUCOSE, CAPILLARY: Glucose-Capillary: 97 mg/dL (ref 65–99)

## 2016-07-06 LAB — POCT GLYCOSYLATED HEMOGLOBIN (HGB A1C): Hemoglobin A1C: 6.2

## 2016-07-06 MED ORDER — FERROUS SULFATE 325 (65 FE) MG PO TABS: 325.0000 mg | ORAL_TABLET | Freq: Every day | ORAL | Status: AC

## 2016-07-06 NOTE — Assessment & Plan Note (Addendum)
Pt only takes xarelto intermittently  -advised her to take xarelto daily -Check BMET for Cr, switch to coumadin if Cr elevated  Addendum: 07/06/2016: Patient's creatinine is elevated at 2.46 which is higher than the last one. Recently, it has been fluctuating around 2 and has worsened over the last year. Her creatinine clearance based on her age and weight is 33 mL/min. She has been taking her xarelto only intermittently. Her CrCl is right at the borderline, as for CrCl<30, xarelto is not recommended.   Patient has had recurrent PEs and is on long term anticoagulation. Patient used to be on warfarin but she had very poor compliance with it, so the coumadin was stopped as her INR was difficult to be therapeutic.   I discussed this with Dr. Heber Ashford, and we will continue the xarelto for now, and closely monitor her renal function monthly. If her renal function deteriorates, then we can consider switching to coumadin.

## 2016-07-06 NOTE — Assessment & Plan Note (Signed)
Does not want to do mammogram right now. Is up to date on colonoscopy  Re inquire abt mammogram at next visit in 3 months Will also inform her abt Dexa scan, and zostavax at next visit  Ordered lipid panel today

## 2016-07-06 NOTE — Assessment & Plan Note (Addendum)
Lab Results  Component Value Date   HGBA1C 6.2 07/06/2016   HGBA1C 6.8* 03/29/2016   HGBA1C 7.2 01/01/2016    Her A1c is improved and is 6.2. She is compliant with her Novolin 70/30 25 units BID. She did not bring meter. Denies hypoglycemia episodes. Checks CBG occasionally.   -Continue Novolin 25 units BID -Follow up in 3 months

## 2016-07-06 NOTE — Assessment & Plan Note (Addendum)
She says she has had chronic fatigue. She also feels off-balance sometimes, and few months ago had a fall, which she was seen here for by Dr Lovena Le in [place].  She did have low HgB with borderline low MCV. Also had borderline high TSH in the past. She denies constipation or feeling cold.  -Check CBC and ferritin, BMP, and TSH -Referred for home health PT for gait and balance training  Addendum: TSh 0.29, Ferritin of 89, CBC no leukocytosis. HgB improved to 13.7 -checking Free T4 as add-on

## 2016-07-06 NOTE — Assessment & Plan Note (Signed)
BP Readings from Last 3 Encounters:  07/06/16 122/66  04/14/16 139/63  04/06/16 173/61   Blood pressure is at goal. Pt is compliant with her amlodipine and the hyzaar.  Continue both meds -Check BMP due to elevated Cr

## 2016-07-06 NOTE — Assessment & Plan Note (Addendum)
Recheck lipid panel today  Addendum: 07/06/2016 LDL 152 Tot Chol 241  She will benefit from switching to high intensity statin. Will discuss at next visit

## 2016-07-06 NOTE — Progress Notes (Signed)
Patient ID: Joy Butler, female   DOB: July 21, 1951, 65 y.o.   MRN: [id number]    CC: HTN, T2DM, recurrent PE, weakness HPI: [name] is a 65 y.o. woman with PMH noted below here for routine follow up for her diabetes, HTN, recurrent PE, weakness.   Please see Problem List/A&P for the status of the patient's chronic medical problems   Past Medical History  Diagnosis Date  . Hypertension   . Pulmonary embolism ([place])     2011 treated at [facility] in [place].  Was on Coumadin for over a year.  no known family history  . UTI (urinary tract infection)   . High cholesterol   . 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 Depression   . Neuropathy ([facility])     feet  . Kidney stones   . Pneumonia     "several times" (07/26/2014)  . Type II diabetes mellitus ([place]) dx'd 2000  . Daily headache   . Migraine     "last one was in the 1990's" (07/26/2014)  . Arthritis     "knees" (07/26/2014)  . Chronic back pain   . Cataract of both eyes   . Hypertensive emergency 12/03/2015    Review of Systems:  Negative except as per HPI  Physical Exam: Filed Vitals:   07/06/16 1558  BP: 122/66  Pulse: 91  Temp: 98.2 F (36.8 C)  TempSrc: Oral  Weight: 206 lb 6.4 oz (93.622 kg)  SpO2: 100%    General: A&O, in NAD, in wheelchair, tired looking Neck: supple, midline trachea CV: RRR, normal s1, s2, no m/r/g Resp: equal and symmetric breath sounds, no wheezing or crackles  Abdomen: soft, nontender, nondistended, +BS Skin: warm, dry, intact, no open lesions or rashes noted. Back scar seems to have healed Extremities: trace pitting edema   Assessment & Plan:   See encounters tab for problem based medical decision making. Patient discussed with Dr. Angelia Mould

## 2016-07-06 NOTE — Patient Instructions (Signed)
Thank you for your visit today Your diabetes is well controlled- keep up the good work ! Please continue to take your medications  Please take the iron medicine  I will call you if your lab results are concerning Please follow up in 3 months

## 2016-07-07 LAB — CBC
HEMOGLOBIN: 13.7 g/dL (ref 11.1–15.9)
Hematocrit: 42.1 % (ref 34.0–46.6)
MCH: 27.3 pg (ref 26.6–33.0)
MCHC: 32.5 g/dL (ref 31.5–35.7)
MCV: 84 fL (ref 79–97)
PLATELETS: 373 10*3/uL (ref 150–379)
RBC: 5.01 x10E6/uL (ref 3.77–5.28)
RDW: 17.8 % — ABNORMAL HIGH (ref 12.3–15.4)
WBC: 8 10*3/uL (ref 3.4–10.8)

## 2016-07-07 LAB — LIPID PANEL
Chol/HDL Ratio: 5 ratio units — ABNORMAL HIGH (ref 0.0–4.4)
Cholesterol, Total: 241 mg/dL — ABNORMAL HIGH (ref 100–199)
HDL: 48 mg/dL (ref >39)
LDL Calculated: 152 mg/dL — ABNORMAL HIGH (ref 0–99)
Triglycerides: 203 mg/dL — ABNORMAL HIGH (ref 0–149)
VLDL Cholesterol Cal: 41 mg/dL — ABNORMAL HIGH (ref 5–40)

## 2016-07-07 LAB — BMP8+ANION GAP
Anion Gap: 17 mmol/L (ref 10.0–18.0)
BUN / CREAT RATIO: 14 (ref 12–28)
BUN: 34 mg/dL — AB (ref 8–27)
CHLORIDE: 106 mmol/L (ref 96–106)
CO2: 20 mmol/L (ref 18–29)
Calcium: 9.2 mg/dL (ref 8.7–10.3)
Creatinine, Ser: 2.46 mg/dL — ABNORMAL HIGH (ref 0.57–1.00)
GFR, EST AFRICAN AMERICAN: 23 mL/min/{1.73_m2} — AB (ref >59)
GFR, EST NON AFRICAN AMERICAN: 20 mL/min/{1.73_m2} — AB (ref >59)
GLUCOSE: 78 mg/dL (ref 65–99)
Potassium: 3.2 mmol/L — ABNORMAL LOW (ref 3.5–5.2)
Sodium: 143 mmol/L (ref 134–144)

## 2016-07-07 LAB — TSH: TSH: 0.295 u[IU]/mL — ABNORMAL LOW (ref 0.450–4.500)

## 2016-07-07 LAB — FERRITIN: Ferritin: 89 ng/mL (ref 15–150)

## 2016-07-07 NOTE — Progress Notes (Signed)
Internal Medicine Clinic Attending  Case discussed with Dr. Saraiya soon after the resident saw the patient.  We reviewed the resident's history and exam and pertinent patient test results.  I agree with the assessment, diagnosis, and plan of care documented in the resident's note.  

## 2016-07-07 NOTE — Addendum Note (Signed)
Addended by: [name] A on: 07/07/2016 09:57 AM   Modules accepted: Orders

## 2016-07-08 ENCOUNTER — Telehealth: Payer: Self-pay | Admitting: Licensed Clinical Social Worker

## 2016-07-08 LAB — T4, FREE: Free T4: 1.06 ng/dL (ref 0.82–1.77)

## 2016-07-08 LAB — SPECIMEN STATUS REPORT

## 2016-07-08 NOTE — Telephone Encounter (Signed)
CSW placed call to Ms. Edmon to discuss [facility] referral and network agencies with [facility].  Pt agreeable to referral to [facility] for [facility] PT.  However, pt states she is working on getting her house together as they have been doing some work and may want her start date to be a week out.  Pt informed agency will be contacting her within the next 2 days and to notify agency what start date works best.  CSW confirmed pt's address and phone number.  Referral sent to [facility].

## 2016-07-14 ENCOUNTER — Telehealth: Payer: Self-pay

## 2016-07-14 NOTE — Telephone Encounter (Signed)
[place] from [facility] calls and states they have tried to make contact with pt for PT services and first pt hung up on them and will now not answer the ph, they will close the case

## 2016-07-14 NOTE — Telephone Encounter (Signed)
Tanzania from [facility] at home requesting the nurse to call back regarding pt.

## 2016-07-17 ENCOUNTER — Other Ambulatory Visit: Payer: Self-pay | Admitting: Pharmacist

## 2016-07-17 DIAGNOSIS — I2699 Other pulmonary embolism without acute cor pulmonale: Secondary | ICD-10-CM

## 2016-07-21 MED ORDER — RIVAROXABAN 15 MG PO TABS: 15.0000 mg | ORAL_TABLET | Freq: Every day | ORAL | 3 refills | Status: DC

## 2016-07-24 NOTE — Addendum Note (Signed)
Addended by: Truddie Crumble on: 07/24/2016 09:46 AM   Modules accepted: Orders

## 2016-07-24 NOTE — Telephone Encounter (Signed)
Patient was contacted  by Hailey Hill, PharmD candidate. I agree with the assessment and plan of care documented. 

## 2016-08-10 ENCOUNTER — Other Ambulatory Visit: Payer: Self-pay | Admitting: Internal Medicine

## 2016-08-11 NOTE — Telephone Encounter (Signed)
Refilled loratadine and pravastatin  Unable to refill tylenol # 3 as no clear indication for opiates

## 2016-09-22 ENCOUNTER — Other Ambulatory Visit: Payer: Self-pay | Admitting: Internal Medicine

## 2016-09-22 DIAGNOSIS — I10 Essential (primary) hypertension: Secondary | ICD-10-CM

## 2016-09-23 NOTE — Telephone Encounter (Signed)
Medications not on pt's current med list.

## 2016-09-30 ENCOUNTER — Telehealth: Payer: Self-pay

## 2016-09-30 NOTE — Telephone Encounter (Signed)
Joy Butler is a 65 y.o. female who was contacted via telephone for monitoring of rivaroxaban (Xarelto) therapy.    ASSESSMENT Indication(s): Recurrent PE  Duration: indefinite  Labs:    Component Value Date/Time   AST 21 04/05/2016 2013   ALT 14 04/05/2016 2013   NA 143 07/06/2016 1631   K 3.2 (L) 07/06/2016 1631   CL 106 07/06/2016 1631   CO2 20 07/06/2016 1631   GLUCOSE 78 07/06/2016 1631   GLUCOSE 116 (H) 04/05/2016 2013   HGBA1C 6.2 07/06/2016 1603   HGBA1C 6.8 (H) 03/29/2016 0722   BUN 34 (H) 07/06/2016 1631   CREATININE 2.46 (H) 07/06/2016 1631   CREATININE 1.27 (H) 07/17/2015 1653   CALCIUM 9.2 07/06/2016 1631   GFRNONAA 20 (L) 07/06/2016 1631   GFRNONAA 44 (L) 07/17/2015 1653   GFRAA 23 (L) 07/06/2016 1631   GFRAA 51 (L) 07/17/2015 1653   WBC 8.0 07/06/2016 1631   WBC 16.2 (H) 04/05/2016 2013   HGB 9.6 (L) 04/05/2016 2013   HCT 42.1 07/06/2016 1631   PLT 373 07/06/2016 1631    rivaroxaban (Xarelto) Dose: 15 mg   Safety: Patient has not had recent bleeding/thromboembolic events. Patient reports potential complications with bleeding including headache following fall and head trauma, no signs of symptoms of thromboembolism. Medication changes: no.  Renal/hepatic/drug interaction concerns: no.  Adherence: Patient reports significant adherence challenges. Patient does not correctly recite the dose. Contacted pharmacy and records indicate refills are consistent. Refill dates 8/17-10/17 (30 day supply). Pharmacy calls patient each month to ask which medications patient wants and patient requests Xarelto, but admits to being non-adherent.    Patient Instructions: Patient advised to seek medical attention in response to fall and head trauma, but patient refused and stated that she is largely non-adherent so not worried about cranial bleeding. Patient verbalized understanding by repeating back information.  Follow-up Recommended labs to consider: CBC . Next  appointment 10/08/16 at   Darcella Cheshire PharmD Candidate  09/30/2016, 4:15 PM

## 2016-10-06 ENCOUNTER — Ambulatory Visit (INDEPENDENT_AMBULATORY_CARE_PROVIDER_SITE_OTHER): Payer: Medicare Other | Admitting: Orthopedic Surgery

## 2016-10-09 ENCOUNTER — Ambulatory Visit: Payer: Medicare Other | Admitting: Pharmacist

## 2016-10-10 ENCOUNTER — Inpatient Hospital Stay (HOSPITAL_COMMUNITY)
Admission: EM | Admit: 2016-10-10 | Discharge: 2016-11-05 | DRG: 870 | Disposition: A | Discharge status: Skilled Nursing Facility | Payer: Medicare Other | Attending: Student in an Organized Health Care Education/Training Program | Admitting: Student in an Organized Health Care Education/Training Program

## 2016-10-10 ENCOUNTER — Encounter (HOSPITAL_COMMUNITY): Payer: Self-pay | Admitting: Emergency Medicine

## 2016-10-10 DIAGNOSIS — F17210 Nicotine dependence, cigarettes, uncomplicated: Secondary | ICD-10-CM | POA: Diagnosis present

## 2016-10-10 DIAGNOSIS — J80 Acute respiratory distress syndrome: Secondary | ICD-10-CM | POA: Diagnosis present

## 2016-10-10 DIAGNOSIS — R6520 Severe sepsis without septic shock: Secondary | ICD-10-CM

## 2016-10-10 DIAGNOSIS — G894 Chronic pain syndrome: Secondary | ICD-10-CM | POA: Diagnosis present

## 2016-10-10 DIAGNOSIS — Z8249 Family history of ischemic heart disease and other diseases of the circulatory system: Secondary | ICD-10-CM

## 2016-10-10 DIAGNOSIS — R319 Hematuria, unspecified: Secondary | ICD-10-CM

## 2016-10-10 DIAGNOSIS — E11649 Type 2 diabetes mellitus with hypoglycemia without coma: Secondary | ICD-10-CM | POA: Diagnosis present

## 2016-10-10 DIAGNOSIS — N1330 Unspecified hydronephrosis: Secondary | ICD-10-CM

## 2016-10-10 DIAGNOSIS — A403 Sepsis due to Streptococcus pneumoniae: Secondary | ICD-10-CM | POA: Diagnosis not present

## 2016-10-10 DIAGNOSIS — Z6828 Body mass index (BMI) 28.0-28.9, adult: Secondary | ICD-10-CM

## 2016-10-10 DIAGNOSIS — E11621 Type 2 diabetes mellitus with foot ulcer: Secondary | ICD-10-CM | POA: Diagnosis present

## 2016-10-10 DIAGNOSIS — R0682 Tachypnea, not elsewhere classified: Secondary | ICD-10-CM

## 2016-10-10 DIAGNOSIS — Z9119 Patient's noncompliance with other medical treatment and regimen: Secondary | ICD-10-CM

## 2016-10-10 DIAGNOSIS — R339 Retention of urine, unspecified: Secondary | ICD-10-CM | POA: Diagnosis not present

## 2016-10-10 DIAGNOSIS — I5043 Acute on chronic combined systolic (congestive) and diastolic (congestive) heart failure: Secondary | ICD-10-CM | POA: Diagnosis present

## 2016-10-10 DIAGNOSIS — I130 Hypertensive heart and chronic kidney disease with heart failure and stage 1 through stage 4 chronic kidney disease, or unspecified chronic kidney disease: Secondary | ICD-10-CM | POA: Diagnosis present

## 2016-10-10 DIAGNOSIS — E860 Dehydration: Secondary | ICD-10-CM | POA: Diagnosis not present

## 2016-10-10 DIAGNOSIS — Z882 Allergy status to sulfonamides status: Secondary | ICD-10-CM

## 2016-10-10 DIAGNOSIS — Z01818 Encounter for other preprocedural examination: Secondary | ICD-10-CM

## 2016-10-10 DIAGNOSIS — F329 Major depressive disorder, single episode, unspecified: Secondary | ICD-10-CM | POA: Diagnosis present

## 2016-10-10 DIAGNOSIS — G43909 Migraine, unspecified, not intractable, without status migrainosus: Secondary | ICD-10-CM | POA: Diagnosis present

## 2016-10-10 DIAGNOSIS — Z883 Allergy status to other anti-infective agents status: Secondary | ICD-10-CM

## 2016-10-10 DIAGNOSIS — J9621 Acute and chronic respiratory failure with hypoxia: Secondary | ICD-10-CM

## 2016-10-10 DIAGNOSIS — A419 Sepsis, unspecified organism: Secondary | ICD-10-CM

## 2016-10-10 DIAGNOSIS — N170 Acute kidney failure with tubular necrosis: Secondary | ICD-10-CM | POA: Diagnosis present

## 2016-10-10 DIAGNOSIS — Z91199 Patient's noncompliance with other medical treatment and regimen due to unspecified reason: Secondary | ICD-10-CM

## 2016-10-10 DIAGNOSIS — J9811 Atelectasis: Secondary | ICD-10-CM | POA: Diagnosis not present

## 2016-10-10 DIAGNOSIS — M6281 Muscle weakness (generalized): Secondary | ICD-10-CM

## 2016-10-10 DIAGNOSIS — T17990A Other foreign object in respiratory tract, part unspecified in causing asphyxiation, initial encounter: Secondary | ICD-10-CM | POA: Diagnosis present

## 2016-10-10 DIAGNOSIS — K219 Gastro-esophageal reflux disease without esophagitis: Secondary | ICD-10-CM | POA: Diagnosis not present

## 2016-10-10 DIAGNOSIS — Z9289 Personal history of other medical treatment: Secondary | ICD-10-CM

## 2016-10-10 DIAGNOSIS — Z881 Allergy status to other antibiotic agents status: Secondary | ICD-10-CM

## 2016-10-10 DIAGNOSIS — F32A Depression, unspecified: Secondary | ICD-10-CM | POA: Diagnosis present

## 2016-10-10 DIAGNOSIS — A409 Streptococcal sepsis, unspecified: Secondary | ICD-10-CM

## 2016-10-10 DIAGNOSIS — I4581 Long QT syndrome: Secondary | ICD-10-CM | POA: Diagnosis present

## 2016-10-10 DIAGNOSIS — G9340 Encephalopathy, unspecified: Secondary | ICD-10-CM | POA: Diagnosis not present

## 2016-10-10 DIAGNOSIS — N281 Cyst of kidney, acquired: Secondary | ICD-10-CM | POA: Diagnosis present

## 2016-10-10 DIAGNOSIS — Z789 Other specified health status: Secondary | ICD-10-CM

## 2016-10-10 DIAGNOSIS — N312 Flaccid neuropathic bladder, not elsewhere classified: Secondary | ICD-10-CM | POA: Diagnosis not present

## 2016-10-10 DIAGNOSIS — J189 Pneumonia, unspecified organism: Secondary | ICD-10-CM

## 2016-10-10 DIAGNOSIS — E872 Acidosis: Secondary | ICD-10-CM | POA: Diagnosis present

## 2016-10-10 DIAGNOSIS — Z79899 Other long term (current) drug therapy: Secondary | ICD-10-CM

## 2016-10-10 DIAGNOSIS — E7800 Pure hypercholesterolemia, unspecified: Secondary | ICD-10-CM | POA: Diagnosis present

## 2016-10-10 DIAGNOSIS — Z978 Presence of other specified devices: Secondary | ICD-10-CM

## 2016-10-10 DIAGNOSIS — Y92019 Unspecified place in single-family (private) house as the place of occurrence of the external cause: Secondary | ICD-10-CM

## 2016-10-10 DIAGNOSIS — R55 Syncope and collapse: Secondary | ICD-10-CM

## 2016-10-10 DIAGNOSIS — N184 Chronic kidney disease, stage 4 (severe): Secondary | ICD-10-CM | POA: Diagnosis present

## 2016-10-10 DIAGNOSIS — E1122 Type 2 diabetes mellitus with diabetic chronic kidney disease: Secondary | ICD-10-CM | POA: Diagnosis present

## 2016-10-10 DIAGNOSIS — R7309 Other abnormal glucose: Secondary | ICD-10-CM

## 2016-10-10 DIAGNOSIS — M48061 Spinal stenosis, lumbar region without neurogenic claudication: Secondary | ICD-10-CM | POA: Diagnosis present

## 2016-10-10 DIAGNOSIS — E119 Type 2 diabetes mellitus without complications: Secondary | ICD-10-CM

## 2016-10-10 DIAGNOSIS — Z794 Long term (current) use of insulin: Secondary | ICD-10-CM

## 2016-10-10 DIAGNOSIS — Z7901 Long term (current) use of anticoagulants: Secondary | ICD-10-CM

## 2016-10-10 DIAGNOSIS — E878 Other disorders of electrolyte and fluid balance, not elsewhere classified: Secondary | ICD-10-CM | POA: Diagnosis present

## 2016-10-10 DIAGNOSIS — N179 Acute kidney failure, unspecified: Secondary | ICD-10-CM | POA: Diagnosis present

## 2016-10-10 DIAGNOSIS — L8990 Pressure ulcer of unspecified site, unspecified stage: Secondary | ICD-10-CM | POA: Insufficient documentation

## 2016-10-10 DIAGNOSIS — J13 Pneumonia due to Streptococcus pneumoniae: Secondary | ICD-10-CM | POA: Diagnosis present

## 2016-10-10 DIAGNOSIS — D72820 Lymphocytosis (symptomatic): Secondary | ICD-10-CM

## 2016-10-10 DIAGNOSIS — I2510 Atherosclerotic heart disease of native coronary artery without angina pectoris: Secondary | ICD-10-CM | POA: Diagnosis present

## 2016-10-10 DIAGNOSIS — R609 Edema, unspecified: Secondary | ICD-10-CM

## 2016-10-10 DIAGNOSIS — N189 Chronic kidney disease, unspecified: Secondary | ICD-10-CM

## 2016-10-10 DIAGNOSIS — W1830XA Fall on same level, unspecified, initial encounter: Secondary | ICD-10-CM | POA: Diagnosis present

## 2016-10-10 DIAGNOSIS — Z86711 Personal history of pulmonary embolism: Secondary | ICD-10-CM

## 2016-10-10 DIAGNOSIS — I10 Essential (primary) hypertension: Secondary | ICD-10-CM | POA: Diagnosis present

## 2016-10-10 DIAGNOSIS — Z91040 Latex allergy status: Secondary | ICD-10-CM

## 2016-10-10 DIAGNOSIS — J383 Other diseases of vocal cords: Secondary | ICD-10-CM

## 2016-10-10 DIAGNOSIS — R262 Difficulty in walking, not elsewhere classified: Secondary | ICD-10-CM

## 2016-10-10 DIAGNOSIS — Z4659 Encounter for fitting and adjustment of other gastrointestinal appliance and device: Secondary | ICD-10-CM

## 2016-10-10 DIAGNOSIS — R401 Stupor: Secondary | ICD-10-CM

## 2016-10-10 DIAGNOSIS — R1313 Dysphagia, pharyngeal phase: Secondary | ICD-10-CM

## 2016-10-10 DIAGNOSIS — I700 Atherosclerosis of aorta: Secondary | ICD-10-CM | POA: Diagnosis present

## 2016-10-10 DIAGNOSIS — D62 Acute posthemorrhagic anemia: Secondary | ICD-10-CM | POA: Diagnosis not present

## 2016-10-10 DIAGNOSIS — R490 Dysphonia: Secondary | ICD-10-CM | POA: Diagnosis not present

## 2016-10-10 DIAGNOSIS — R061 Stridor: Secondary | ICD-10-CM

## 2016-10-10 DIAGNOSIS — Z9049 Acquired absence of other specified parts of digestive tract: Secondary | ICD-10-CM

## 2016-10-10 DIAGNOSIS — R4020 Unspecified coma: Secondary | ICD-10-CM

## 2016-10-10 DIAGNOSIS — R6521 Severe sepsis with septic shock: Secondary | ICD-10-CM | POA: Diagnosis present

## 2016-10-10 DIAGNOSIS — E870 Hyperosmolality and hypernatremia: Secondary | ICD-10-CM | POA: Diagnosis not present

## 2016-10-10 DIAGNOSIS — R000 Tachycardia, unspecified: Secondary | ICD-10-CM

## 2016-10-10 DIAGNOSIS — E46 Unspecified protein-calorie malnutrition: Secondary | ICD-10-CM | POA: Diagnosis present

## 2016-10-10 DIAGNOSIS — E876 Hypokalemia: Secondary | ICD-10-CM | POA: Diagnosis present

## 2016-10-10 DIAGNOSIS — K5900 Constipation, unspecified: Secondary | ICD-10-CM | POA: Diagnosis not present

## 2016-10-10 DIAGNOSIS — H269 Unspecified cataract: Secondary | ICD-10-CM | POA: Diagnosis present

## 2016-10-10 DIAGNOSIS — Z452 Encounter for adjustment and management of vascular access device: Secondary | ICD-10-CM

## 2016-10-10 DIAGNOSIS — I509 Heart failure, unspecified: Secondary | ICD-10-CM

## 2016-10-10 LAB — CBG MONITORING, ED: GLUCOSE-CAPILLARY: 63 mg/dL — AB (ref 65–99)

## 2016-10-10 MED ORDER — DEXTROSE 50 % IV SOLN
1.0000 | Freq: Once | INTRAVENOUS | Status: AC
  Administered 2016-10-11: 50 mL via INTRAVENOUS
  Filled 2016-10-10: qty 50

## 2016-10-10 NOTE — ED Provider Notes (Signed)
[place] DEPT Provider Note   CSN: [id number] Arrival date & time: 10/10/16  2342  By signing my name below, I, Joy Butler, attest that this documentation has been prepared under the direction and in the presence of [name], MD. Electronically Signed: [name], ED Scribe. 10/10/16. 12:06 AM.   History   Chief Complaint Chief Complaint  Patient presents with  . Loss of Consciousness    HPI Comments: Joy Butler is a 65 y.o. female with a hx of DM, hypertension, and PE brought in by ambulance, who presents to the Emergency Department for evaluation s/p an episode of possible LOC. She reports associated bilateral leg pain and shortness of breath. She states that she is unsure of the mechanism of her fall, however per EMS, pt's family called because she was unresponsive PTA. Family reported syncopal episode.  Noted to be hypoxic by EMS.  Not on home O2.  She states that she has had a cough onset one week ago. No alleviating factors noted. Pt has not tried any medications PTA. She has an allergy to Keflex, Clindamycin, Latex, and Sulfa Antibiotics. She denies a hx of COPD or CHF. She also denies any fever, nausea, vomiting, visual disturbances, or any other symptoms.   The history is provided by the patient. No language interpreter was used.    Past Medical History:  Diagnosis Date  . Arthritis    "knees" (07/26/2014)  . Cataract of both eyes   . Chronic back pain   . Daily headache   . Depression   . High cholesterol   . Hypertension   . Hypertensive emergency 12/03/2015  . Kidney stones   . Migraine    "last one was in the 1990's" (07/26/2014)  . Neuropathy (HCC)    feet  . Pneumonia    "several times" (07/26/2014)  . Pulmonary embolism ([place])    2011 treated at [facility] in [place].  Was on Coumadin for over a year.  no known family history  . 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 Type II diabetes mellitus ([place]) dx'd 2000  . UTI  (urinary tract infection)     Patient Active Problem List   Diagnosis Date Noted  . Fatigue 07/06/2016  . Type 2 diabetes mellitus with complication, with long-term current use of insulin ([place])   . Weakness of right lower extremity   . Spondylosis of lumbar region without myelopathy or radiculopathy   . Chronic renal insufficiency   . Candida infection   . Cellulitis of back except buttock   . Type 2 diabetes mellitus with skin complication (HCC)   . Abscess of back 03/27/2016  . Fall 03/20/2016  . Pressure ulcer 03/09/2016  . AKI (acute kidney injury) ([place])   . Diarrhea   . Generalized abdominal pain   . Gastroenteritis 03/07/2016  . Acute kidney injury superimposed on chronic kidney disease ([place]) 01/15/2016  . Unexplained weight loss   . Abdominal pain 07/17/2015  . Leg swelling 04/08/2015  . Insomnia 04/08/2015  . Other bilateral secondary osteoarthritis of knee 03/27/2015  . Breast cyst 02/07/2015  . Cataract 01/23/2015  . Diabetic retinopathy ([place]) 08/08/2014  . Hyperlipidemia 07/31/2014  . Abnormality of gait 07/30/2014  . Diabetic foot ulcer ([place]) 07/26/2014  . Intertrigo 05/16/2014  . Falls 08/14/2013  . Depression 03/01/2013  . Health care maintenance 01/30/2013  . Hypercholesteremia 01/29/2013  . Smoking 01/29/2013  . External hemorrhoids 01/29/2013  . Long term current use  of anticoagulant therapy 10/24/2012  . Recurrent pulmonary embolism ([place]) 10/17/2012  . Diabetic peripheral neuropathy ([place]) 08/20/2012  . Chronic back pain 08/20/2012  . Essential hypertension 08/17/2012  . DM (diabetes mellitus), type 2 ([place]) 08/17/2012    Past Surgical History:  Procedure Laterality Date  . APPENDECTOMY    . ARTERY BIOPSY Right 05/09/2014   Procedure: BIOPSY TEMPORAL ARTERY;  Surgeon: Rosetta Posner, MD;  Location: [facility];  Service: Vascular;  Laterality: Right;  . BREAST BIOPSY Left   . CARPAL TUNNEL RELEASE Bilateral   . CESAREAN SECTION  1982  . CHOLECYSTECTOMY       1980  . COLONOSCOPY WITH PROPOFOL N/A 12/06/2015   Procedure: COLONOSCOPY WITH PROPOFOL;  Surgeon: Wilford Corner, MD;  Location[facility] ENDOSCOPY;  Service: Endoscopy;  Laterality: N/A;  . CYSTOSCOPY W/ STONE MANIPULATION    . DILATION AND CURETTAGE OF UTERUS  1982  . ESOPHAGOGASTRODUODENOSCOPY  08/19/2012   Procedure: ESOPHAGOGASTRODUODENOSCOPY (EGD);  Surgeon: Winfield Cunas., MD;  Location: [facility] ENDOSCOPY;  Service: Endoscopy;  Laterality: N/A;  . ESOPHAGOGASTRODUODENOSCOPY (EGD) WITH PROPOFOL N/A 12/06/2015   Procedure: ESOPHAGOGASTRODUODENOSCOPY (EGD) WITH PROPOFOL;  Surgeon: Wilford Corner, MD;  Location: [facility] ENDOSCOPY;  Service: Endoscopy;  Laterality: N/A;  . EYE SURGERY Bilateral   . INCISION AND DRAINAGE ABSCESS N/A 03/28/2016   Procedure: INCISION, DRAINAGE  AND DEBRIDEMENT BACK  ABSCESS;  Surgeon: Greer Pickerel, MD;  Location: [place];  Service: General;  Laterality: N/A;  . REFRACTIVE SURGERY Bilateral   . TEMPORAL ARTERY BIOPSY / LIGATION Right 04/2014  . TONSILLECTOMY    . VAGINAL HYSTERECTOMY      OB History    No data available       Home Medications    Prior to Admission medications   Medication Sig Start Date End Date Taking? Authorizing Provider  amLODipine (NORVASC) 10 MG tablet Take 1 tablet (10 mg total) by mouth daily. 09/23/16  Yes Oval Linsey, MD  ferrous sulfate 325 (65 FE) MG tablet Take 1 tablet (325 mg total) by mouth daily. 07/06/16 07/06/17 Yes [name], MD  insulin NPH-regular Human (NOVOLIN 70/30) (70-30) 100 UNIT/ML injection Inject 25 Units into the skin 2 (two) times daily with a meal. 03/31/16  Yes Loleta Chance, MD  loratadine (CLARITIN) 10 MG tablet TAKE 1 TABLET BY MOUTH ONCE DAILY 08/11/16  Yes [name], MD  losartan-hydrochlorothiazide (HYZAAR) 100-25 MG tablet TAKE 1 TABLET BY MOUTH EVERY DAY 03/26/16  Yes [name], MD  pravastatin (PRAVACHOL) 40 MG tablet TAKE 1 TABLET BY MOUTH EVERY EVENING 08/11/16  Yes [name], MD   Rivaroxaban (XARELTO) 15 MG TABS tablet Take 1 tablet (15 mg total) by mouth daily with supper. Please counsel on adherence 07/21/16  Yes [name], MD  gabapentin (NEURONTIN) 300 MG capsule TAKE 1 CAPSULE BY MOUTH THREE TIMES A DAY Patient not taking: Reported on 10/11/2016 07/01/16   [name], MD  traZODone (DESYREL) 150 MG tablet TAKE 1 TABLET BY MOUTH EVERY NIGHT AT BEDTIME Patient not taking: Reported on 10/11/2016 12/11/15   [name], MD    Family History Family History  Problem Relation Age of Onset  . Adopted: Yes  . Hypertension Mother     Social History Social History  Substance Use Topics  . Smoking status: Current Every Day Smoker    Packs/day: 0.25    Years: 44.00    Types: Cigarettes  . Smokeless tobacco: Never Used     Comment:  5 CIGARETTES A DAY  or less  . Alcohol use No     Allergies   Keflex [cephalexin]; Clindamycin/lincomycin; Latex; and Sulfa antibiotics   Review of Systems Review of Systems  Constitutional: Negative for chills and fever.  Eyes: Negative for visual disturbance.  Respiratory: Positive for shortness of breath.   Cardiovascular: Negative for chest pain.  Gastrointestinal: Negative for nausea and vomiting.  Musculoskeletal: Positive for arthralgias.  Neurological: Positive for syncope.  All other systems reviewed and are negative.    Physical Exam Updated Vital Signs BP 116/65   Pulse 103   Temp 100.6 F (38.1 C)   Resp 26   SpO2 98%   Physical Exam  Constitutional: She is oriented to person, place, and time.  Ill-appearing, overweight  HENT:  Head: Normocephalic and atraumatic.  Eyes: Pupils are equal, round, and reactive to light.  Cardiovascular: Regular rhythm and normal heart sounds.   Tachycardia  Pulmonary/Chest: She is in respiratory distress. She has no wheezes.  Increased work of breathing, speaking in short sentences, diffuse crackles most prominently right lung in all fields  Abdominal: Soft.  Bowel sounds are normal. There is no tenderness. There is no guarding.  Musculoskeletal:  Trace bilateral lower extremity edema  Neurological: She is alert and oriented to person, place, and time.  Skin: Skin is warm and dry.  Psychiatric: She has a normal mood and affect.  Nursing note and vitals reviewed.    ED Treatments / Results  DIAGNOSTIC STUDIES: Oxygen Saturation is 91% on [place], low by my interpretation.    COORDINATION OF CARE: 11:53 PM- Pt advised of plan for treatment and pt agrees. Pt will receive lab work, chest x-ray, and EKG for further evaluation. She will also receive dextrose 50% solution.   Labs (all labs ordered are listed, but only abnormal results are displayed) Labs Reviewed  CBC WITH DIFFERENTIAL/PLATELET - Abnormal; Notable for the following:       Result Value   WBC 18.7 (*)    RDW 16.2 (*)    Neutro Abs 17.4 (*)    All other components within normal limits  COMPREHENSIVE METABOLIC PANEL - Abnormal; Notable for the following:    Potassium 3.3 (*)    CO2 15 (*)    Glucose, Bld 58 (*)    BUN 46 (*)    Creatinine, Ser 3.85 (*)    Calcium 7.8 (*)    Total Protein 6.3 (*)    Albumin 1.6 (*)    ALT 13 (*)    Total Bilirubin 2.8 (*)    GFR calc non Af Amer 11 (*)    GFR calc Af Amer 13 (*)    All other components within normal limits  BRAIN NATRIURETIC PEPTIDE - Abnormal; Notable for the following:    B Natriuretic Peptide 284.5 (*)    All other components within normal limits  CBG MONITORING, ED - Abnormal; Notable for the following:    Glucose-Capillary 63 (*)    All other components within normal limits  I-STAT CG4 LACTIC ACID, ED - Abnormal; Notable for the following:    Lactic Acid, Venous 2.84 (*)    All other components within normal limits  I-STAT ARTERIAL BLOOD GAS, ED - Abnormal; Notable for the following:    pCO2 arterial 24.9 (*)    pO2, Arterial 57.0 (*)    Bicarbonate 13.8 (*)    Acid-base deficit 10.0 (*)    All other components  within normal limits  CULTURE, BLOOD (ROUTINE X 2)  CULTURE, BLOOD (  ROUTINE X 2)  URINE CULTURE  URINALYSIS, ROUTINE W REFLEX MICROSCOPIC (NOT AT [facility])  I-STAT TROPOININ, ED  CBG MONITORING, ED    EKG  EKG Interpretation  Date/Time:  Saturday October 10 2016 23:56:17 EDT Ventricular Rate:  109 PR Interval:    QRS Duration: 90 QT Interval:  377 QTC Calculation: 508 R Axis:   162 Text Interpretation:  Sinus tachycardia Atrial premature complex Right axis deviation Prolonged QT interval Confirmed by Dina Rich  MD, Cesily Cuoco ([id number]) on 10/10/2016 11:58:53 PM       Radiology Dg Chest Portable 1 View  Result Date: 10/11/2016 CLINICAL DATA:  Shortness of breath tonight. EXAM: PORTABLE CHEST 1 VIEW COMPARISON:  Chest radiograph April 05, 2016. FINDINGS: Dense RIGHT greater than LEFT alveolar airspace opacities. Cardiac silhouette is mildly enlarged and unchanged. Mildly calcified aortic knob. No pleural effusion. No pneumothorax. Severe RIGHT and moderate LEFT shoulder degenerative change. IMPRESSION: Multifocal pneumonia. Followup PA and lateral chest X-ray is recommended in 3-4 weeks following trial of antibiotic therapy to ensure resolution and exclude underlying malignancy. Mild cardiomegaly. Electronically Signed   By: Elon Alas M.D.   On: 10/11/2016 00:49    Procedures Procedures (including critical care time)  CRITICAL CARE Performed by: [name]   Total critical care time: 30 minutes  Critical care time was exclusive of separately billable procedures and treating other patients.  Critical care was necessary to treat or prevent imminent or life-threatening deterioration.  Critical care was time spent personally by me on the following activities: development of treatment plan with patient and/or surrogate as well as nursing, discussions with consultants, evaluation of patient's response to treatment, examination of patient, obtaining history from patient or  surrogate, ordering and performing treatments and interventions, ordering and review of laboratory studies, ordering and review of radiographic studies, pulse oximetry and re-evaluation of patient's condition.   Medications Ordered in ED Medications  0.9 %  sodium chloride infusion (1,000 mLs Intravenous New Bag/Given 10/11/16 0101)  levofloxacin (LEVAQUIN) IVPB 750 mg (750 mg Intravenous New Bag/Given 10/11/16 0102)  vancomycin (VANCOCIN) IVPB 1000 mg/200 mL premix (not administered)  dextrose 50 % solution 50 mL (50 mLs Intravenous Given 10/11/16 0013)  morphine 4 MG/ML injection 4 mg (4 mg Intravenous Given 10/11/16 0101)  ondansetron (ZOFRAN) injection 4 mg (4 mg Intravenous Given 10/11/16 0101)     Initial Impression / Assessment and Plan / ED Course  [name], MD has reviewed the triage vital signs and the nursing notes.  Pertinent labs & imaging results that were available during my care of the patient were reviewed by me and considered in my medical decision making (see chart for details).  Clinical Course    Patient presents after an episode of loss of consciousness. Found to be hypoxic. His ill appearing on exam. Increased work of breathing. Hypoxic in the mid 70s on room air. She is placed on a nonrebreather. Otherwise vital signs notable for tachycardia. No hypertension. Lactate mildly elevated at 2.84. No signs of volume overload. Rectal temp 100.8. Sepsis workup initiated. Given hypoxia and pulmonary exam in the setting of a lactate less than 4 and normal pressure, fluids were held to prevent volume overload. Chest x-ray shows multifocal pneumonia. ABG is compensated. However, given workup breathing, patient was placed on BiPAP. She was given Levaquin and vancomycin per protocol for community-acquired pneumonia with a cephalosporin allergy. Will admit to the hospital.  After history, exam, and medical workup I feel the patient has been appropriately  medically screened  and is safe for discharge home. Pertinent diagnoses were discussed with the patient. Patient was given return precautions.     Final Clinical Impressions(s) / ED Diagnoses   Final diagnoses:  Community acquired pneumonia of right lung, unspecified part of lung  Syncope, unspecified syncope type  Acute on chronic respiratory failure with hypoxia (HCC)    New Prescriptions New Prescriptions   No medications on file   I personally performed the services described in this documentation, which was scribed in my presence. The recorded information has been reviewed and is accurate.    [name], MD 10/11/16 [phone]

## 2016-10-10 NOTE — ED Notes (Signed)
CBG resulted: 63. RN notified.

## 2016-10-10 NOTE — ED Triage Notes (Signed)
Per [facility]: Pt to ED from home following unwitnessed syncopal episode. Per EMS, pt's husband found her on the floor, unresponsive. He did a few chest compressions and then she came around. Per pt, she remembers everything and her legs gave out on her while walking to the bathroom. Pt 80% RA with initial responders, placed on NRB, increased to 90%. Hx blood clots, on blood thinner. Pt reports bilateral leg pain and chest pain with cough and "not feeling well." Pt A&O x 4 at this time. EMS VS: (CBG 61 with Fire) CBG 91, 114/67, P 111 ST. Pt reports normally being weak in her legs and uses a cane to get around. Respirations labored.

## 2016-10-11 ENCOUNTER — Emergency Department (HOSPITAL_COMMUNITY): Payer: Medicare Other

## 2016-10-11 ENCOUNTER — Inpatient Hospital Stay (HOSPITAL_COMMUNITY): Payer: Medicare Other

## 2016-10-11 DIAGNOSIS — J9621 Acute and chronic respiratory failure with hypoxia: Secondary | ICD-10-CM

## 2016-10-11 DIAGNOSIS — N170 Acute kidney failure with tubular necrosis: Secondary | ICD-10-CM | POA: Diagnosis present

## 2016-10-11 DIAGNOSIS — J9601 Acute respiratory failure with hypoxia: Secondary | ICD-10-CM | POA: Diagnosis not present

## 2016-10-11 DIAGNOSIS — R1319 Other dysphagia: Secondary | ICD-10-CM | POA: Diagnosis not present

## 2016-10-11 DIAGNOSIS — A419 Sepsis, unspecified organism: Secondary | ICD-10-CM | POA: Diagnosis not present

## 2016-10-11 DIAGNOSIS — N179 Acute kidney failure, unspecified: Secondary | ICD-10-CM

## 2016-10-11 DIAGNOSIS — Z9119 Patient's noncompliance with other medical treatment and regimen: Secondary | ICD-10-CM | POA: Diagnosis not present

## 2016-10-11 DIAGNOSIS — J387 Other diseases of larynx: Secondary | ICD-10-CM | POA: Diagnosis not present

## 2016-10-11 DIAGNOSIS — J80 Acute respiratory distress syndrome: Secondary | ICD-10-CM | POA: Diagnosis present

## 2016-10-11 DIAGNOSIS — Z86711 Personal history of pulmonary embolism: Secondary | ICD-10-CM | POA: Diagnosis not present

## 2016-10-11 DIAGNOSIS — J13 Pneumonia due to Streptococcus pneumoniae: Secondary | ICD-10-CM | POA: Diagnosis present

## 2016-10-11 DIAGNOSIS — J810 Acute pulmonary edema: Secondary | ICD-10-CM | POA: Diagnosis not present

## 2016-10-11 DIAGNOSIS — E872 Acidosis: Secondary | ICD-10-CM | POA: Diagnosis present

## 2016-10-11 DIAGNOSIS — R55 Syncope and collapse: Secondary | ICD-10-CM | POA: Diagnosis present

## 2016-10-11 DIAGNOSIS — A403 Sepsis due to Streptococcus pneumoniae: Secondary | ICD-10-CM | POA: Diagnosis present

## 2016-10-11 DIAGNOSIS — I4581 Long QT syndrome: Secondary | ICD-10-CM | POA: Diagnosis present

## 2016-10-11 DIAGNOSIS — Y92019 Unspecified place in single-family (private) house as the place of occurrence of the external cause: Secondary | ICD-10-CM | POA: Diagnosis not present

## 2016-10-11 DIAGNOSIS — J9811 Atelectasis: Secondary | ICD-10-CM | POA: Diagnosis not present

## 2016-10-11 DIAGNOSIS — J181 Lobar pneumonia, unspecified organism: Secondary | ICD-10-CM | POA: Diagnosis not present

## 2016-10-11 DIAGNOSIS — E878 Other disorders of electrolyte and fluid balance, not elsewhere classified: Secondary | ICD-10-CM | POA: Diagnosis not present

## 2016-10-11 DIAGNOSIS — N189 Chronic kidney disease, unspecified: Secondary | ICD-10-CM

## 2016-10-11 DIAGNOSIS — E1122 Type 2 diabetes mellitus with diabetic chronic kidney disease: Secondary | ICD-10-CM | POA: Diagnosis present

## 2016-10-11 DIAGNOSIS — N184 Chronic kidney disease, stage 4 (severe): Secondary | ICD-10-CM | POA: Diagnosis present

## 2016-10-11 DIAGNOSIS — F17210 Nicotine dependence, cigarettes, uncomplicated: Secondary | ICD-10-CM | POA: Diagnosis present

## 2016-10-11 DIAGNOSIS — R061 Stridor: Secondary | ICD-10-CM | POA: Diagnosis not present

## 2016-10-11 DIAGNOSIS — Z794 Long term (current) use of insulin: Secondary | ICD-10-CM | POA: Diagnosis not present

## 2016-10-11 DIAGNOSIS — Z883 Allergy status to other anti-infective agents status: Secondary | ICD-10-CM | POA: Diagnosis not present

## 2016-10-11 DIAGNOSIS — J189 Pneumonia, unspecified organism: Secondary | ICD-10-CM | POA: Diagnosis present

## 2016-10-11 DIAGNOSIS — E11649 Type 2 diabetes mellitus with hypoglycemia without coma: Secondary | ICD-10-CM | POA: Diagnosis present

## 2016-10-11 DIAGNOSIS — E1165 Type 2 diabetes mellitus with hyperglycemia: Secondary | ICD-10-CM | POA: Diagnosis not present

## 2016-10-11 DIAGNOSIS — D72829 Elevated white blood cell count, unspecified: Secondary | ICD-10-CM | POA: Diagnosis not present

## 2016-10-11 DIAGNOSIS — N1330 Unspecified hydronephrosis: Secondary | ICD-10-CM | POA: Diagnosis present

## 2016-10-11 DIAGNOSIS — R490 Dysphonia: Secondary | ICD-10-CM | POA: Diagnosis not present

## 2016-10-11 DIAGNOSIS — W1830XA Fall on same level, unspecified, initial encounter: Secondary | ICD-10-CM | POA: Diagnosis present

## 2016-10-11 DIAGNOSIS — D62 Acute posthemorrhagic anemia: Secondary | ICD-10-CM | POA: Diagnosis not present

## 2016-10-11 DIAGNOSIS — Z7901 Long term (current) use of anticoagulants: Secondary | ICD-10-CM | POA: Diagnosis not present

## 2016-10-11 DIAGNOSIS — E46 Unspecified protein-calorie malnutrition: Secondary | ICD-10-CM | POA: Diagnosis present

## 2016-10-11 DIAGNOSIS — E1140 Type 2 diabetes mellitus with diabetic neuropathy, unspecified: Secondary | ICD-10-CM | POA: Diagnosis not present

## 2016-10-11 DIAGNOSIS — R6521 Severe sepsis with septic shock: Secondary | ICD-10-CM

## 2016-10-11 DIAGNOSIS — J983 Compensatory emphysema: Secondary | ICD-10-CM | POA: Diagnosis not present

## 2016-10-11 DIAGNOSIS — I5043 Acute on chronic combined systolic (congestive) and diastolic (congestive) heart failure: Secondary | ICD-10-CM | POA: Diagnosis present

## 2016-10-11 DIAGNOSIS — G9340 Encephalopathy, unspecified: Secondary | ICD-10-CM | POA: Diagnosis not present

## 2016-10-11 DIAGNOSIS — R6520 Severe sepsis without septic shock: Secondary | ICD-10-CM

## 2016-10-11 DIAGNOSIS — J383 Other diseases of vocal cords: Secondary | ICD-10-CM | POA: Diagnosis not present

## 2016-10-11 DIAGNOSIS — E870 Hyperosmolality and hypernatremia: Secondary | ICD-10-CM | POA: Diagnosis not present

## 2016-10-11 DIAGNOSIS — I130 Hypertensive heart and chronic kidney disease with heart failure and stage 1 through stage 4 chronic kidney disease, or unspecified chronic kidney disease: Secondary | ICD-10-CM | POA: Diagnosis present

## 2016-10-11 LAB — BLOOD CULTURE ID PANEL (REFLEXED)
Acinetobacter baumannii: NOT DETECTED
CANDIDA KRUSEI: NOT DETECTED
Candida albicans: NOT DETECTED
Candida glabrata: NOT DETECTED
Candida parapsilosis: NOT DETECTED
Candida tropicalis: NOT DETECTED
ENTEROCOCCUS SPECIES: NOT DETECTED
ESCHERICHIA COLI: NOT DETECTED
Enterobacter cloacae complex: NOT DETECTED
Enterobacteriaceae species: NOT DETECTED
HAEMOPHILUS INFLUENZAE: NOT DETECTED
Klebsiella oxytoca: NOT DETECTED
Klebsiella pneumoniae: NOT DETECTED
LISTERIA MONOCYTOGENES: NOT DETECTED
NEISSERIA MENINGITIDIS: NOT DETECTED
PROTEUS SPECIES: NOT DETECTED
PSEUDOMONAS AERUGINOSA: NOT DETECTED
SERRATIA MARCESCENS: NOT DETECTED
STAPHYLOCOCCUS AUREUS BCID: NOT DETECTED
STAPHYLOCOCCUS SPECIES: NOT DETECTED
STREPTOCOCCUS AGALACTIAE: NOT DETECTED
STREPTOCOCCUS PNEUMONIAE: DETECTED — AB
STREPTOCOCCUS PYOGENES: NOT DETECTED
STREPTOCOCCUS SPECIES: DETECTED — AB

## 2016-10-11 LAB — I-STAT CG4 LACTIC ACID, ED
LACTIC ACID, VENOUS: 2.1 mmol/L — AB (ref 0.5–1.9)
Lactic Acid, Venous: 2.84 mmol/L (ref 0.5–1.9)

## 2016-10-11 LAB — CBG MONITORING, ED: Glucose-Capillary: 100 mg/dL — ABNORMAL HIGH (ref 65–99)

## 2016-10-11 LAB — POCT I-STAT 3, ART BLOOD GAS (G3+)
ACID-BASE DEFICIT: 11 mmol/L — AB (ref 0.0–2.0)
Acid-base deficit: 13 mmol/L — ABNORMAL HIGH (ref 0.0–2.0)
BICARBONATE: 14.8 mmol/L — AB (ref 20.0–28.0)
Bicarbonate: 15.3 mmol/L — ABNORMAL LOW (ref 20.0–28.0)
O2 SAT: 99 %
O2 SAT: 98 %
PCO2 ART: 43.6 mmHg (ref 32.0–48.0)
PO2 ART: 184 mmHg — AB (ref 83.0–108.0)
PO2 ART: 110 mmHg — AB (ref 83.0–108.0)
Patient temperature: 98.3
TCO2: 17 mmol/L (ref 0–100)
TCO2: 16 mmol/L (ref 0–100)
pCO2 arterial: 31.8 mmHg — ABNORMAL LOW (ref 32.0–48.0)
pH, Arterial: 7.153 — CL (ref 7.350–7.450)
pH, Arterial: 7.276 — ABNORMAL LOW (ref 7.350–7.450)

## 2016-10-11 LAB — COMPREHENSIVE METABOLIC PANEL
ALK PHOS: 112 U/L (ref 38–126)
ALK PHOS: 123 U/L (ref 38–126)
ALT: 12 U/L — ABNORMAL LOW (ref 14–54)
ALT: 13 U/L — AB (ref 14–54)
ANION GAP: 15 (ref 5–15)
AST: 14 U/L — ABNORMAL LOW (ref 15–41)
AST: 25 U/L (ref 15–41)
Albumin: 1.5 g/dL — ABNORMAL LOW (ref 3.5–5.0)
Albumin: 1.6 g/dL — ABNORMAL LOW (ref 3.5–5.0)
Anion gap: 14 (ref 5–15)
BILIRUBIN TOTAL: 2.5 mg/dL — AB (ref 0.3–1.2)
BILIRUBIN TOTAL: 2.8 mg/dL — AB (ref 0.3–1.2)
BUN: 46 mg/dL — ABNORMAL HIGH (ref 6–20)
BUN: 49 mg/dL — ABNORMAL HIGH (ref 6–20)
CALCIUM: 7.4 mg/dL — AB (ref 8.9–10.3)
CALCIUM: 7.8 mg/dL — AB (ref 8.9–10.3)
CO2: 12 mmol/L — ABNORMAL LOW (ref 22–32)
CO2: 15 mmol/L — AB (ref 22–32)
CREATININE: 3.85 mg/dL — AB (ref 0.44–1.00)
Chloride: 109 mmol/L (ref 101–111)
Chloride: 113 mmol/L — ABNORMAL HIGH (ref 101–111)
Creatinine, Ser: 3.84 mg/dL — ABNORMAL HIGH (ref 0.44–1.00)
GFR calc non Af Amer: 11 mL/min — ABNORMAL LOW (ref >60)
GFR calc non Af Amer: 11 mL/min — ABNORMAL LOW (ref >60)
GFR, EST AFRICAN AMERICAN: 13 mL/min — AB (ref >60)
GFR, EST AFRICAN AMERICAN: 13 mL/min — AB (ref >60)
GLUCOSE: 105 mg/dL — AB (ref 65–99)
Glucose, Bld: 58 mg/dL — ABNORMAL LOW (ref 65–99)
Potassium: 3.3 mmol/L — ABNORMAL LOW (ref 3.5–5.1)
Potassium: 3.3 mmol/L — ABNORMAL LOW (ref 3.5–5.1)
SODIUM: 138 mmol/L (ref 135–145)
Sodium: 140 mmol/L (ref 135–145)
TOTAL PROTEIN: 5.8 g/dL — AB (ref 6.5–8.1)
TOTAL PROTEIN: 6.3 g/dL — AB (ref 6.5–8.1)

## 2016-10-11 LAB — BLOOD GAS, ARTERIAL
Acid-base deficit: 9.3 mmol/L — ABNORMAL HIGH (ref 0.0–2.0)
BICARBONATE: 14.5 mmol/L — AB (ref 20.0–28.0)
DRAWN BY: 418751
FIO2: 70
MECHVT: 580 mL
O2 Saturation: 98.5 %
PATIENT TEMPERATURE: 98.6
PEEP/CPAP: 10 cmH2O
PO2 ART: 134 mmHg — AB (ref 83.0–108.0)
RATE: 30 resp/min
pCO2 arterial: 23.7 mmHg — ABNORMAL LOW (ref 32.0–48.0)
pH, Arterial: 7.404 (ref 7.350–7.450)

## 2016-10-11 LAB — BASIC METABOLIC PANEL
Anion gap: 14 (ref 5–15)
BUN: 51 mg/dL — ABNORMAL HIGH (ref 6–20)
CHLORIDE: 112 mmol/L — AB (ref 101–111)
CO2: 17 mmol/L — ABNORMAL LOW (ref 22–32)
CREATININE: 3.99 mg/dL — AB (ref 0.44–1.00)
Calcium: 6.8 mg/dL — ABNORMAL LOW (ref 8.9–10.3)
GFR, EST AFRICAN AMERICAN: 12 mL/min — AB (ref >60)
GFR, EST NON AFRICAN AMERICAN: 11 mL/min — AB (ref >60)
Glucose, Bld: 114 mg/dL — ABNORMAL HIGH (ref 65–99)
Potassium: 2.8 mmol/L — ABNORMAL LOW (ref 3.5–5.1)
SODIUM: 143 mmol/L (ref 135–145)

## 2016-10-11 LAB — CBC WITH DIFFERENTIAL/PLATELET
BASOS PCT: 0 %
Basophils Absolute: 0 10*3/uL (ref 0.0–0.1)
EOS ABS: 0 10*3/uL (ref 0.0–0.7)
EOS PCT: 0 %
HEMATOCRIT: 37 % (ref 36.0–46.0)
HEMOGLOBIN: 13.1 g/dL (ref 12.0–15.0)
LYMPHS PCT: 5 %
Lymphs Abs: 0.9 10*3/uL (ref 0.7–4.0)
MCH: 27.9 pg (ref 26.0–34.0)
MCHC: 35.4 g/dL (ref 30.0–36.0)
MCV: 78.9 fL (ref 78.0–100.0)
MONOS PCT: 2 %
Monocytes Absolute: 0.4 10*3/uL (ref 0.1–1.0)
NEUTROS ABS: 17.4 10*3/uL — AB (ref 1.7–7.7)
Neutrophils Relative %: 93 %
OTHER: 0 %
Platelets: 397 10*3/uL (ref 150–400)
RBC: 4.69 MIL/uL (ref 3.87–5.11)
RDW: 16.2 % — ABNORMAL HIGH (ref 11.5–15.5)
WBC: 18.7 10*3/uL — ABNORMAL HIGH (ref 4.0–10.5)

## 2016-10-11 LAB — RAPID URINE DRUG SCREEN, HOSP PERFORMED
Amphetamines: NOT DETECTED
Barbiturates: NOT DETECTED
Benzodiazepines: NOT DETECTED
Cocaine: NOT DETECTED
OPIATES: POSITIVE — AB
TETRAHYDROCANNABINOL: NOT DETECTED

## 2016-10-11 LAB — I-STAT ARTERIAL BLOOD GAS, ED
ACID-BASE DEFICIT: 10 mmol/L — AB (ref 0.0–2.0)
Acid-base deficit: 10 mmol/L — ABNORMAL HIGH (ref 0.0–2.0)
BICARBONATE: 15.2 mmol/L — AB (ref 20.0–28.0)
Bicarbonate: 13.8 mmol/L — ABNORMAL LOW (ref 20.0–28.0)
O2 Saturation: 87 %
O2 Saturation: 92 %
PH ART: 7.358 (ref 7.350–7.450)
PO2 ART: 57 mmHg — AB (ref 83.0–108.0)
TCO2: 15 mmol/L (ref 0–100)
TCO2: 16 mmol/L (ref 0–100)
pCO2 arterial: 24.9 mmHg — ABNORMAL LOW (ref 32.0–48.0)
pCO2 arterial: 31.5 mmHg — ABNORMAL LOW (ref 32.0–48.0)
pH, Arterial: 7.293 — ABNORMAL LOW (ref 7.350–7.450)
pO2, Arterial: 69 mmHg — ABNORMAL LOW (ref 83.0–108.0)

## 2016-10-11 LAB — PROCALCITONIN: Procalcitonin: 42.77 ng/mL

## 2016-10-11 LAB — APTT: aPTT: 42 seconds — ABNORMAL HIGH (ref 24–36)

## 2016-10-11 LAB — INFLUENZA PANEL BY PCR (TYPE A & B)
H1N1FLUPCR: NOT DETECTED
INFLBPCR: NEGATIVE
Influenza A By PCR: NEGATIVE

## 2016-10-11 LAB — LACTIC ACID, PLASMA
Lactic Acid, Venous: 2.2 mmol/L (ref 0.5–1.9)
Lactic Acid, Venous: 2.2 mmol/L (ref 0.5–1.9)

## 2016-10-11 LAB — GLUCOSE, CAPILLARY
GLUCOSE-CAPILLARY: 108 mg/dL — AB (ref 65–99)
GLUCOSE-CAPILLARY: 118 mg/dL — AB (ref 65–99)
GLUCOSE-CAPILLARY: 108 mg/dL — AB (ref 65–99)
Glucose-Capillary: 79 mg/dL (ref 65–99)

## 2016-10-11 LAB — URINALYSIS, ROUTINE W REFLEX MICROSCOPIC
Glucose, UA: NEGATIVE mg/dL
KETONES UR: NEGATIVE mg/dL
LEUKOCYTES UA: NEGATIVE
NITRITE: NEGATIVE
Specific Gravity, Urine: 1.018 (ref 1.005–1.030)
pH: 5.5 (ref 5.0–8.0)

## 2016-10-11 LAB — TROPONIN I
TROPONIN I: 0.17 ng/mL — AB (ref <0.03)
TROPONIN I: 0.62 ng/mL — AB (ref <0.03)
Troponin I: 2.63 ng/mL (ref <0.03)
Troponin I: 0.08 ng/mL (ref <0.03)

## 2016-10-11 LAB — MRSA PCR SCREENING: MRSA BY PCR: NEGATIVE

## 2016-10-11 LAB — PROTIME-INR
INR: 1.42
Prothrombin Time: 17.5 seconds — ABNORMAL HIGH (ref 11.4–15.2)

## 2016-10-11 LAB — BRAIN NATRIURETIC PEPTIDE: B Natriuretic Peptide: 284.5 pg/mL — ABNORMAL HIGH (ref 0.0–100.0)

## 2016-10-11 LAB — URINE MICROSCOPIC-ADD ON

## 2016-10-11 LAB — HEPARIN LEVEL (UNFRACTIONATED)
HEPARIN UNFRACTIONATED: 0.19 [IU]/mL — AB (ref 0.30–0.70)
HEPARIN UNFRACTIONATED: 0.2 [IU]/mL — AB (ref 0.30–0.70)
Heparin Unfractionated: <0.1 IU/mL — ABNORMAL LOW (ref 0.30–0.70)

## 2016-10-11 LAB — CORTISOL
CORTISOL PLASMA: 21.9 ug/dL
CORTISOL PLASMA: 32.8 ug/dL

## 2016-10-11 LAB — I-STAT TROPONIN, ED: Troponin i, poc: 0.07 ng/mL (ref 0.00–0.08)

## 2016-10-11 LAB — STREP PNEUMONIAE URINARY ANTIGEN: Strep Pneumo Urinary Antigen: POSITIVE — AB

## 2016-10-11 MED ORDER — SODIUM CHLORIDE 0.9% FLUSH
3.0000 mL | Freq: Two times a day (BID) | INTRAVENOUS | Status: DC
  Administered 2016-10-11 – 2016-10-25 (×19): 3 mL via INTRAVENOUS
  Administered 2016-10-26: 13:00:00 via INTRAVENOUS
  Administered 2016-10-27 – 2016-11-05 (×12): 3 mL via INTRAVENOUS

## 2016-10-11 MED ORDER — LEVOFLOXACIN IN D5W 750 MG/150ML IV SOLN
750.0000 mg | Freq: Once | INTRAVENOUS | Status: AC
  Administered 2016-10-11: 750 mg via INTRAVENOUS
  Filled 2016-10-11: qty 150

## 2016-10-11 MED ORDER — SODIUM CHLORIDE 0.9 % IV SOLN
INTRAVENOUS | Status: DC
  Administered 2016-10-11 (×2): via INTRAVENOUS
  Administered 2016-10-12: 125 mL/h via INTRAVENOUS

## 2016-10-11 MED ORDER — INSULIN NPH (HUMAN) (ISOPHANE) 100 UNIT/ML ~~LOC~~ SUSP: 10.0000 [IU] | Freq: Two times a day (BID) | SUBCUTANEOUS | Status: DC

## 2016-10-11 MED ORDER — INSULIN ASPART 100 UNIT/ML ~~LOC~~ SOLN
0.0000 [IU] | SUBCUTANEOUS | Status: DC
  Administered 2016-10-13 – 2016-10-16 (×8): 1 [IU] via SUBCUTANEOUS
  Administered 2016-10-16: 2 [IU] via SUBCUTANEOUS
  Administered 2016-10-16 – 2016-10-17 (×5): 1 [IU] via SUBCUTANEOUS
  Administered 2016-10-17: 2 [IU] via SUBCUTANEOUS
  Administered 2016-10-17: 1 [IU] via SUBCUTANEOUS
  Administered 2016-10-17: 2 [IU] via SUBCUTANEOUS
  Administered 2016-10-17: 1 [IU] via SUBCUTANEOUS
  Administered 2016-10-18: 2 [IU] via SUBCUTANEOUS
  Administered 2016-10-18: 1 [IU] via SUBCUTANEOUS
  Administered 2016-10-18 (×2): 2 [IU] via SUBCUTANEOUS
  Administered 2016-10-18: 1 [IU] via SUBCUTANEOUS
  Administered 2016-10-18: 2 [IU] via SUBCUTANEOUS
  Administered 2016-10-19 (×2): 7 [IU] via SUBCUTANEOUS

## 2016-10-11 MED ORDER — FENTANYL CITRATE (PF) 100 MCG/2ML IJ SOLN
50.0000 ug | INTRAMUSCULAR | Status: DC | PRN
  Administered 2016-10-11 – 2016-10-20 (×30): 50 ug via INTRAVENOUS
  Filled 2016-10-11 (×28): qty 2

## 2016-10-11 MED ORDER — ONDANSETRON HCL 4 MG/2ML IJ SOLN
4.0000 mg | Freq: Once | INTRAMUSCULAR | Status: AC
  Administered 2016-10-11: 4 mg via INTRAVENOUS
  Filled 2016-10-11: qty 2

## 2016-10-11 MED ORDER — FAMOTIDINE IN NACL 20-0.9 MG/50ML-% IV SOLN
20.0000 mg | INTRAVENOUS | Status: DC
  Administered 2016-10-12 – 2016-10-24 (×13): 20 mg via INTRAVENOUS
  Filled 2016-10-11 (×14): qty 50

## 2016-10-11 MED ORDER — ACETAMINOPHEN 325 MG PO TABS
650.0000 mg | ORAL_TABLET | ORAL | Status: DC | PRN
  Administered 2016-10-21: 650 mg via ORAL
  Filled 2016-10-11: qty 2

## 2016-10-11 MED ORDER — MORPHINE SULFATE (PF) 4 MG/ML IV SOLN: 2.0000 mg | INTRAVENOUS | Status: DC | PRN

## 2016-10-11 MED ORDER — LEVOFLOXACIN IN D5W 500 MG/100ML IV SOLN
500.0000 mg | INTRAVENOUS | Status: DC
  Administered 2016-10-13: 500 mg via INTRAVENOUS
  Filled 2016-10-11: qty 100

## 2016-10-11 MED ORDER — SODIUM CHLORIDE 0.9 % IV BOLUS (SEPSIS)
1000.0000 mL | Freq: Once | INTRAVENOUS | Status: AC
  Administered 2016-10-11: 1000 mL via INTRAVENOUS

## 2016-10-11 MED ORDER — ASPIRIN 300 MG RE SUPP: 300.0000 mg | RECTAL | Status: AC

## 2016-10-11 MED ORDER — ROCURONIUM BROMIDE 50 MG/5ML IV SOLN
INTRAVENOUS | Status: AC | PRN
  Administered 2016-10-11: 100 mg via INTRAVENOUS

## 2016-10-11 MED ORDER — GUAIFENESIN 100 MG/5ML PO SOLN
15.0000 mL | Freq: Four times a day (QID) | ORAL | Status: DC
  Administered 2016-10-12 (×3): 300 mg
  Administered 2016-10-12: 15 mg
  Administered 2016-10-13 – 2016-10-15 (×10): 300 mg
  Filled 2016-10-11 (×16): qty 15

## 2016-10-11 MED ORDER — SODIUM CHLORIDE 0.9 % IV SOLN
250.0000 mL | INTRAVENOUS | Status: DC | PRN
  Administered 2016-10-20: 10 mL/h via INTRAVENOUS

## 2016-10-11 MED ORDER — INSULIN ASPART 100 UNIT/ML ~~LOC~~ SOLN: 0.0000 [IU] | Freq: Three times a day (TID) | SUBCUTANEOUS | Status: DC

## 2016-10-11 MED ORDER — CHLORHEXIDINE GLUCONATE 0.12% ORAL RINSE (MEDLINE KIT)
15.0000 mL | Freq: Two times a day (BID) | OROMUCOSAL | Status: DC
  Administered 2016-10-11 – 2016-11-04 (×43): 15 mL via OROMUCOSAL

## 2016-10-11 MED ORDER — INSULIN ASPART 100 UNIT/ML ~~LOC~~ SOLN: 0.0000 [IU] | Freq: Every day | SUBCUTANEOUS | Status: DC

## 2016-10-11 MED ORDER — BISACODYL 10 MG RE SUPP: 10.0000 mg | Freq: Every day | RECTAL | Status: DC | PRN

## 2016-10-11 MED ORDER — POTASSIUM CHLORIDE 20 MEQ/15ML (10%) PO SOLN
10.0000 meq | Freq: Once | ORAL | Status: AC
  Administered 2016-10-11: 10 meq
  Filled 2016-10-11: qty 15

## 2016-10-11 MED ORDER — RIVAROXABAN 15 MG PO TABS: 15.0000 mg | ORAL_TABLET | Freq: Every day | ORAL | Status: DC

## 2016-10-11 MED ORDER — WARFARIN - PHARMACIST DOSING INPATIENT
Freq: Every day | Status: DC
  Administered 2016-10-16 – 2016-10-17 (×2)

## 2016-10-11 MED ORDER — IPRATROPIUM-ALBUTEROL 0.5-2.5 (3) MG/3ML IN SOLN
3.0000 mL | Freq: Four times a day (QID) | RESPIRATORY_TRACT | Status: DC
  Administered 2016-10-11 – 2016-10-26 (×61): 3 mL via RESPIRATORY_TRACT
  Filled 2016-10-11 (×61): qty 3

## 2016-10-11 MED ORDER — FAMOTIDINE IN NACL 20-0.9 MG/50ML-% IV SOLN
20.0000 mg | Freq: Two times a day (BID) | INTRAVENOUS | Status: DC
  Filled 2016-10-11 (×2): qty 50

## 2016-10-11 MED ORDER — SODIUM BICARBONATE 8.4 % IV SOLN
100.0000 meq | Freq: Once | INTRAVENOUS | Status: AC
  Administered 2016-10-11: 100 meq via INTRAVENOUS
  Filled 2016-10-11 (×2): qty 100

## 2016-10-11 MED ORDER — MIDAZOLAM HCL 2 MG/2ML IJ SOLN
1.0000 mg | INTRAMUSCULAR | Status: DC | PRN
  Administered 2016-10-11: 21:00:00 via INTRAVENOUS
  Administered 2016-10-12 – 2016-10-16 (×6): 1 mg via INTRAVENOUS
  Filled 2016-10-11 (×7): qty 2

## 2016-10-11 MED ORDER — ACETAMINOPHEN 325 MG PO TABS
650.0000 mg | ORAL_TABLET | Freq: Four times a day (QID) | ORAL | Status: DC | PRN
  Administered 2016-10-21: 650 mg via ORAL
  Filled 2016-10-11: qty 2

## 2016-10-11 MED ORDER — FERROUS SULFATE 325 (65 FE) MG PO TABS: 325.0000 mg | ORAL_TABLET | Freq: Every day | ORAL | Status: DC

## 2016-10-11 MED ORDER — PRAVASTATIN SODIUM 40 MG PO TABS
40.0000 mg | ORAL_TABLET | Freq: Every evening | ORAL | Status: DC
  Administered 2016-10-11 – 2016-11-05 (×22): 40 mg via ORAL
  Filled 2016-10-11 (×24): qty 1

## 2016-10-11 MED ORDER — ONDANSETRON HCL 4 MG/2ML IJ SOLN
4.0000 mg | Freq: Four times a day (QID) | INTRAMUSCULAR | Status: DC | PRN
  Administered 2016-10-11 – 2016-10-22 (×3): 4 mg via INTRAVENOUS
  Filled 2016-10-11 (×3): qty 2

## 2016-10-11 MED ORDER — WARFARIN SODIUM 7.5 MG PO TABS
7.5000 mg | ORAL_TABLET | Freq: Once | ORAL | Status: AC
Start: 2016-10-11 — End: 2016-10-11
  Administered 2016-10-11: 7.5 mg via ORAL
  Filled 2016-10-11: qty 1

## 2016-10-11 MED ORDER — ETOMIDATE 2 MG/ML IV SOLN
INTRAVENOUS | Status: AC | PRN
  Administered 2016-10-11: 30 mg via INTRAVENOUS

## 2016-10-11 MED ORDER — MORPHINE SULFATE (PF) 4 MG/ML IV SOLN
4.0000 mg | Freq: Once | INTRAVENOUS | Status: AC
  Administered 2016-10-11: 4 mg via INTRAVENOUS
  Filled 2016-10-11: qty 1

## 2016-10-11 MED ORDER — PRAVASTATIN SODIUM 40 MG PO TABS: 40.0000 mg | ORAL_TABLET | Freq: Every evening | ORAL | Status: DC

## 2016-10-11 MED ORDER — NOREPINEPHRINE BITARTRATE 1 MG/ML IV SOLN
2.0000 ug/min | INTRAVENOUS | Status: DC
  Administered 2016-10-11: 2 ug/min via INTRAVENOUS
  Filled 2016-10-11: qty 4

## 2016-10-11 MED ORDER — SODIUM CHLORIDE 0.9 % IV SOLN
1000.0000 mL | INTRAVENOUS | Status: DC
  Administered 2016-10-11 (×2): 1000 mL via INTRAVENOUS

## 2016-10-11 MED ORDER — VANCOMYCIN HCL IN DEXTROSE 1-5 GM/200ML-% IV SOLN
1000.0000 mg | Freq: Once | INTRAVENOUS | Status: AC
  Administered 2016-10-11: 1000 mg via INTRAVENOUS
  Filled 2016-10-11: qty 200

## 2016-10-11 MED ORDER — VANCOMYCIN HCL IN DEXTROSE 1-5 GM/200ML-% IV SOLN
1000.0000 mg | INTRAVENOUS | Status: DC
  Administered 2016-10-13: 1000 mg via INTRAVENOUS
  Filled 2016-10-11: qty 200

## 2016-10-11 MED ORDER — ACETAMINOPHEN 650 MG RE SUPP: 650.0000 mg | Freq: Four times a day (QID) | RECTAL | Status: DC | PRN

## 2016-10-11 MED ORDER — GUAIFENESIN ER 600 MG PO TB12
600.0000 mg | ORAL_TABLET | Freq: Two times a day (BID) | ORAL | Status: DC
  Administered 2016-10-11: 600 mg via ORAL
  Filled 2016-10-11 (×2): qty 1

## 2016-10-11 MED ORDER — SODIUM BICARBONATE 8.4 % IV SOLN
INTRAVENOUS | Status: AC
  Administered 2016-10-11: 50 meq via INTRAVENOUS
  Filled 2016-10-11: qty 50

## 2016-10-11 MED ORDER — MIDAZOLAM HCL 2 MG/2ML IJ SOLN
1.0000 mg | INTRAMUSCULAR | Status: DC | PRN
  Administered 2016-10-14: 1 mg via INTRAVENOUS
  Filled 2016-10-11: qty 2

## 2016-10-11 MED ORDER — ORAL CARE MOUTH RINSE
15.0000 mL | Freq: Four times a day (QID) | OROMUCOSAL | Status: DC
  Administered 2016-10-12 – 2016-10-30 (×67): 15 mL via OROMUCOSAL

## 2016-10-11 MED ORDER — FENTANYL CITRATE (PF) 100 MCG/2ML IJ SOLN
50.0000 ug | INTRAMUSCULAR | Status: AC | PRN
  Administered 2016-10-12 – 2016-10-15 (×3): 50 ug via INTRAVENOUS
  Filled 2016-10-11 (×3): qty 2

## 2016-10-11 MED ORDER — AMLODIPINE BESYLATE 5 MG PO TABS: 10.0000 mg | ORAL_TABLET | Freq: Every day | ORAL | Status: DC

## 2016-10-11 MED ORDER — HEPARIN (PORCINE) IN NACL 100-0.45 UNIT/ML-% IJ SOLN
1900.0000 [IU]/h | INTRAMUSCULAR | Status: AC
  Administered 2016-10-11: 1500 [IU]/h via INTRAVENOUS
  Administered 2016-10-11: 1250 [IU]/h via INTRAVENOUS
  Administered 2016-10-13 – 2016-10-15 (×5): 1900 [IU]/h via INTRAVENOUS
  Filled 2016-10-11 (×15): qty 250

## 2016-10-11 MED ORDER — ASPIRIN 81 MG PO CHEW
324.0000 mg | CHEWABLE_TABLET | ORAL | Status: AC
  Administered 2016-10-11: 324 mg via ORAL
  Filled 2016-10-11: qty 4

## 2016-10-11 MED ORDER — DOCUSATE SODIUM 50 MG/5ML PO LIQD
100.0000 mg | Freq: Two times a day (BID) | ORAL | Status: DC | PRN
  Administered 2016-11-02: 100 mg
  Filled 2016-10-11: qty 10

## 2016-10-11 MED ORDER — POTASSIUM CHLORIDE CRYS ER 20 MEQ PO TBCR: 40.0000 meq | EXTENDED_RELEASE_TABLET | Freq: Once | ORAL | Status: DC

## 2016-10-11 MED ORDER — SODIUM BICARBONATE 8.4 % IV SOLN
50.0000 meq | Freq: Once | INTRAVENOUS | Status: AC
  Administered 2016-10-11: 50 meq via INTRAVENOUS
  Filled 2016-10-11: qty 50

## 2016-10-11 NOTE — Progress Notes (Signed)
Levophed just shut off and fentanyl versed given for xsedation

## 2016-10-11 NOTE — Progress Notes (Signed)
Bedside handoff communication to Laurena Spies, RN.

## 2016-10-11 NOTE — ED Provider Notes (Signed)
I was contacted by the primary team who mentioned that the patient's work of breathing had increased and patient had become more somnolent. They consulted critical care team who was not available at the time. They requested assistance with intubation. Upon my assessment patient was awake however somnolent. She was hypotensive with systolics in the [id number]. She did not seem to be in any respiratory distress however she was on nonrebreather and satting well.. On evaluation of the patient's imaging she did have significant bilateral pneumonia. Patient was previously identified as DNR/DNI however during internal medicine's conversation with her as well as my conversation with her the patient reported that she would be unable to intubation.  2 L of IV fluids were initiated with minimal blood pressure response. Levophed was started. With improved hemodynamics. Patient was intubated under RSI. Confirmed equal bilateral breath sounds and CO2 detector.  Critical care fellow was at bedside for procedure. Care was handed off to them.  CRITICAL CARE Performed by: Grayce Sessions Jamilex Bohnsack Total critical care time: 30 minutes Critical care time was exclusive of separately billable procedures and treating other patients. Critical care was necessary to treat or prevent imminent or life-threatening deterioration. Critical care was time spent personally by me on the following activities: development of treatment plan with patient and/or surrogate as well as nursing, discussions with consultants, evaluation of patient's response to treatment, examination of patient, obtaining history from patient or surrogate, ordering and performing treatments and interventions, ordering and review of laboratory studies, ordering and review of radiographic studies, pulse oximetry and re-evaluation of patient's condition.   INTUBATION Performed by: Grayce Sessions Jamesyn Lindell  Required items: required blood products, implants, devices, and special  equipment available Patient identity confirmed: provided demographic data and hospital-assigned identification number Time out: Immediately prior to procedure a "time out" was called to verify the correct patient, procedure, equipment, support staff and site/side marked as required.  Indications: somnolence   Intubation method: DL  Preoxygenation: NRB  Sedatives: 30 mgEtomidate Paralytic: 100 mg Roc  Tube Size: 7.5 cuffed  Post-procedure assessment: chest rise and ETCO2 monitor Breath sounds: equal and absent over the epigastrium Tube secured with: ETT holder Chest x-ray interpreted by radiologist and me.  Chest x-ray findings: endotracheal tube in appropriate position  Patient tolerated the procedure well with no immediate complications.      Joy Blank, MD 10/12/16 [phone]

## 2016-10-11 NOTE — Consult Note (Signed)
PULMONARY / CRITICAL CARE MEDICINE   Name: Joy Butler MRN: [id number] DOB: Jan 20, 1951    ADMISSION DATE:  10/10/2016 CONSULTATION DATE:  10/11/16   REFERRING MD:  Dr. Reesa Chew / IMTS   CHIEF COMPLAINT:  Multi-focal PNA / Acute Respiratory Failure   HISTORY OF PRESENT ILLNESS:   [name], occasional cigarette smoking, with PMH of arthritis, cataracts, chronic back pain, migraines, depression, HLD, HTN, splenic infarct, DM II, PE (2011, on xarelto with intermittent compliance) who presented to [facility] on 10/22 with reports of lower extremity pain and shortness of breath. The patient also reported a possible syncopal episode.  She presented to the ER via EMS. Per report, she had a memory of the fall. EMS found the patient to be hypoxic with room air saturations of 80%. She does not wear home oxygen at baseline. She was placed on a nonrebreather with improvement to 90%. The patient reported development of a cough, weakness and "not feeling well" one week prior to presentation. Initial ER evaluation found her to have increased work of breathing on exam with saturations in the 70s on room air. She had a mild elevation of lactic acid at 2.84 and rectal temperature of 100.8. The patient was found to have a chest x-ray concerning for multifocal pneumonia. Initially she was not hypotensive. However, later in the morning the patient developed hypotension and increased work of breathing. ABG evaluation demonstrated worsening acidosis. Initial labs-NA 138, K3.3, creatinine 3.85 (up from 2.46), because 58, anion gap 14, alkaline phosphatase 123, albumin 1.6, BNP 284, troponin 0.07, WBC 18.7, hemoglobin 13.1, and platelets 397.  She felt nausea prior to intubation and vomited (the patient was able to notify staff, sat upright and secretions cleared via suction). Due to bilateral opacities, hypoxic respiratory failure and developing hypotension the decision was made to intubate the patient for airway  protection.  PCCM called for ICU admission.  PAST MEDICAL HISTORY :  She  has a past medical history of Arthritis; Cataract of both eyes; Chronic back pain; Daily headache; Depression; High cholesterol; Hypertension; Hypertensive emergency (12/03/2015); Kidney stones; Migraine; Neuropathy ([place]); Pneumonia; Pulmonary embolism ([place]); Splenic infarction; Type II diabetes mellitus ([place]) (dx'd 2000); and UTI (urinary tract infection).  PAST SURGICAL HISTORY: She  has a past surgical history that includes Cholecystectomy; Esophagogastroduodenoscopy (08/19/2012); Cesarean section (1982); Carpal tunnel release (Bilateral); Tonsillectomy; Artery Biopsy (Right, 05/09/2014); Appendectomy; Vaginal hysterectomy; Dilation and curettage of uterus (1982); Cystoscopy w/ stone manipulation; Breast biopsy (Left); Temporal artery biopsy / ligation (Right, 04/2014); Eye surgery (Bilateral); Refractive surgery (Bilateral); Esophagogastroduodenoscopy (egd) with propofol (N/A, 12/06/2015); Colonoscopy with propofol (N/A, 12/06/2015); and Incision and drainage abscess (N/A, 03/28/2016).  Allergies  Allergen Reactions  . Keflex [Cephalexin] Itching and Swelling    Lips and eyes swelled up  . Clindamycin/Lincomycin Itching  . Latex Itching  . Sulfa Antibiotics Rash    No current facility-administered medications on file prior to encounter.    Current Outpatient Prescriptions on File Prior to Encounter  Medication Sig  . amLODipine (NORVASC) 10 MG tablet Take 1 tablet (10 mg total) by mouth daily.  . ferrous sulfate 325 (65 FE) MG tablet Take 1 tablet (325 mg total) by mouth daily.  . insulin NPH-regular Human (NOVOLIN 70/30) (70-30) 100 UNIT/ML injection Inject 25 Units into the skin 2 (two) times daily with a meal.  . loratadine (CLARITIN) 10 MG tablet TAKE 1 TABLET BY MOUTH ONCE DAILY  . losartan-hydrochlorothiazide (HYZAAR) 100-25 MG tablet TAKE 1 TABLET BY MOUTH EVERY DAY  .  pravastatin (PRAVACHOL) 40 MG tablet TAKE  1 TABLET BY MOUTH EVERY EVENING  . Rivaroxaban (XARELTO) 15 MG TABS tablet Take 1 tablet (15 mg total) by mouth daily with supper. Please counsel on adherence    FAMILY HISTORY:  Her is adopted.    SOCIAL HISTORY: She  reports that she has been smoking Cigarettes.  She has a 11.00 pack-year smoking history. She has never used smokeless tobacco. She reports that she does not drink alcohol or use drugs.  REVIEW OF SYSTEMS:  Unable to complete as patient is on mechanical ventilation   SUBJECTIVE:   VITAL SIGNS: BP (!) 89/48   Pulse 93   Temp 100.6 F (38.1 C)   Resp 21   Ht 5\' 9"  (1.753 m)   SpO2 95%   HEMODYNAMICS:    VENTILATOR SETTINGS:    INTAKE / OUTPUT: I/O last 3 completed shifts: In: 350 [IV Piggyback:350] Out: -   PHYSICAL EXAMINATION: General:  Ill appearing female  Neuro: awake/appropriate prior to intubation, currently sedate  HEENT:  MM pink/dry, no jvd, ETT in place Cardiovascular:  s1s2 rrr, no m/r/g, mild tachycardia  Lungs:  Shallow, mild tachypnea, bronchial breath sounds on R, coarse on L Abdomen:  Obese/soft, bsx4 active  Musculoskeletal:  No acute deformities, BLE symmetrical  Skin:  Warm/dry, no edema  LABS:  BMET  Recent Labs Lab 10/10/16 0005  NA 138  K 3.3*  CL 109  CO2 15*  BUN 46*  CREATININE 3.85*  GLUCOSE 58*    Electrolytes  Recent Labs Lab 10/10/16 0005  CALCIUM 7.8*    CBC  Recent Labs Lab 10/10/16 0005  WBC 18.7*  HGB 13.1  HCT 37.0  PLT 397    Coag's  Recent Labs Lab 10/11/16 0520  APTT 42*  INR 1.42    Sepsis Markers  Recent Labs Lab 10/11/16 0019 10/11/16 0526  LATICACIDVEN 2.84* 2.10*    ABG  Recent Labs Lab 10/11/16 0016 10/11/16 0900  PHART 7.358 7.293*  PCO2ART 24.9* 31.5*  PO2ART 57.0* 69.0*    Liver Enzymes  Recent Labs Lab 10/10/16 0005  AST 25  ALT 13*  ALKPHOS 123  BILITOT 2.8*  ALBUMIN 1.6*    Cardiac Enzymes No results for input(s): TROPONINI, PROBNP in  the last 168 hours.  Glucose  Recent Labs Lab 10/10/16 2351 10/11/16 0733  GLUCAP 63* 100*    Imaging Ct Chest Wo Contrast  Result Date: 10/11/2016 CLINICAL DATA:  65 year old female with pneumonia. EXAM: CT CHEST WITHOUT CONTRAST TECHNIQUE: Multidetector CT imaging of the chest was performed following the standard protocol without IV contrast. COMPARISON:  Chest radiograph dated 10/11/2016 FINDINGS: Evaluation of this exam is limited in the absence of intravenous contrast. Cardiovascular: Mild atherosclerotic calcification of the aortic arch. No aneurysmal dilatation. There is mild dilatation of the main pulmonary trunk indicative of a degree of pulmonary hypertension. Evaluation of the vasculature is limited in the absence of intravenous contrast. Mild cardiomegaly. No pericardial effusion. Multi vessel coronary artery disease Mediastinum/Nodes: Mediastinal adenopathy along the right paratracheal region measure up to 15 mm in short axis. Probable right hilar adenopathy. Evaluation of the hila is limited on this noncontrast study. The esophagus and thyroid gland are unremarkable. Lungs/Pleura: There consolidative changes of the lower lobes with air bronchograms involving the superior and posterior basal segments. There is a large area of consolidative changes in the right upper lobe involving the posterior segment as well as patchy areas of airspace density in the lingula findings are  most compatible with multifocal pneumonia. Correlation with clinical exam and follow-up to resolution recommended to exclude underlying mass. There is no pleural effusion. No pneumothorax. The central airways are patent. Upper Abdomen: Mild diffuse fatty infiltration of the liver. The visualized upper abdomen is otherwise unremarkable. Musculoskeletal: Mild diffuse subcutaneous edema. No fluid collection or hematoma.6 multilevel degenerative changes of the spine with endplate irregularity and anterior osteophyte  formation. Disc desiccation with diffuse disc bulge and vacuum phenomenon noted at T12-L1. No acute fracture. IMPRESSION: Extensive bilateral airspace consolidative changes most compatible with multifocal pneumonia. Clinical correlation and follow-up resolution recommended. Mild cardiomegaly with multi vessel coronary artery disease. Electronically Signed   By: Anner Crete M.D.   On: 10/11/2016 04:52   Dg Chest Portable 1 View  Result Date: 10/11/2016 CLINICAL DATA:  Shortness of breath tonight. EXAM: PORTABLE CHEST 1 VIEW COMPARISON:  Chest radiograph April 05, 2016. FINDINGS: Dense RIGHT greater than LEFT alveolar airspace opacities. Cardiac silhouette is mildly enlarged and unchanged. Mildly calcified aortic knob. No pleural effusion. No pneumothorax. Severe RIGHT and moderate LEFT shoulder degenerative change. IMPRESSION: Multifocal pneumonia. Followup PA and lateral chest X-ray is recommended in 3-4 weeks following trial of antibiotic therapy to ensure resolution and exclude underlying malignancy. Mild cardiomegaly. Electronically Signed   By: Elon Alas M.D.   On: 10/11/2016 00:49     STUDIES:  ECHO 10/22 >>  CT Chest 10/22 >> extensive bilateral consolidative changes consistent with multifocal pneumonia, mild cardiomegaly with multivessel coronary disease  CULTURES: Influenza 10/22 >>  BCx2 10/22 >>  UA 10/22 >>  UC 10/22 >>  ANTIBIOTICS: Vanco 10/22 >>  Levaquin 10/22 >>   SIGNIFICANT EVENTS: 10/22  Admit per IMTS with PNA, failed BiPAP & required intubation  LINES/TUBES: ETT 10/22 >>   DISCUSSION: [name] with PMH of PE (non-compliant with xarelto), HTN, HLD admitted 10/22 per IMTS with multifocal PNA.  Developed worsening respiratory failure requiring intubation in ER.    ASSESSMENT / PLAN:  PULMONARY A: Acute Hypoxic Respiratory Failure - in the setting of multi-focal PNA  Multi-focal PNA - R>L opacities  Hx PE - on Xarelto, non-compliant with  anticoagulation  P:   PRVC 8 cc/kg  Wean PEEP / FiO2 for sats > 92% Trend CXR  May need low Vt ventilation  See ID  Heparin gtt per pharmacy for hx PE   CARDIOVASCULAR A:  Septic Shock - in setting of multifocal PNA Syncopal Episode - prior to admit Hx HTN, HLD P:  ICU monitoring of hemodynamics  Volume resuscitation, 30 ml/kg  Levophed for MAP >65  Will reassess for need of TLC on arrival to ICU  Assess ECHO to review RV (hx non-compliance with anticoagulation) Hold home norvasc  RENAL A:   Acute Kidney Injury  - baseline sr cr ~ 2.4 P:   Trend BMP / UOP  Replace electrolytes as indicated   GASTROINTESTINAL A:   Protein Calorie Malnutrition  Nausea / Vomiting  P:   NPO  Place OGT  Begin TF pm 10/22  PRN zofran for nausea / vomiting   HEMATOLOGIC A:   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  Staff reports no family have visited this am.    - Inter-disciplinary family meet or Palliative Care meeting due by:  10/29   Noe Gens, NP-C [facility] Pgr: [phone] or if no answer [phone] 10/11/2016, 9:58 AM

## 2016-10-11 NOTE — Progress Notes (Signed)
[facility] Progress Note Patient Name: Joy Butler DOB: 03-26-1951 MRN: [id number]   Date of Service  10/11/2016  HPI/Events of Note  ABG on 70%/PRVC 30/tV 580/P 14 = 7.27/31.8/110/14.  eICU Interventions  Will order: 1. NaHCO3 100 meq IV now.  2. ABG at 9:30 PM.         Lysle Dingwall 10/11/2016, 3:45 PM

## 2016-10-11 NOTE — Progress Notes (Signed)
[facility] Progress Note Patient Name: Joy Butler DOB: 1951-05-15 MRN: [id number]   Date of Service  10/11/2016  HPI/Events of Note  K+ = 2.8 and Creatinine = 3.99.  eICU Interventions  Will replace K+ cautiously.      Intervention Category Intermediate Interventions: Electrolyte abnormality - evaluation and management  Lizanne Erker Cornelia Copa 10/11/2016, 7:31 PM

## 2016-10-11 NOTE — Progress Notes (Signed)
[facility] for Heparin and Warfarin Indication: pulmonary embolus  Allergies  Allergen Reactions  . Keflex [Cephalexin] Itching and Swelling    Lips and eyes swelled up  . Clindamycin/Lincomycin Itching  . Latex Itching  . Sulfa Antibiotics Rash    Patient Measurements: Height = 69 inches Weight = 94 kg IBW = 66.2 kg Heparin Dosing Weight: 86 kg  Vital Signs: Temp: 100.6 F (38.1 C) (10/22 0041) Temp Source: Rectal (10/22 0014) BP: 104/48 (10/22 1042) Pulse Rate: 102 (10/22 1042)  Labs:  Recent Labs  10/10/16 0005 10/11/16 0520 10/11/16 1208  HGB 13.1  --   --   HCT 37.0  --   --   PLT 397  --   --   APTT  --  42*  --   LABPROT  --  17.5*  --   INR  --  1.42  --   HEPARINUNFRC  --  <0.10* 0.19*  CREATININE 3.85*  --  3.84*    CrCl cannot be calculated (Unknown ideal weight.).   Medical History: Past Medical History:  Diagnosis Date  . Arthritis    "knees" (07/26/2014)  . Cataract of both eyes   . Chronic back pain   . Daily headache   . Depression   . High cholesterol   . Hypertension   . Hypertensive emergency 12/03/2015  . Kidney stones   . Migraine    "last one was in the 1990's" (07/26/2014)  . Neuropathy ([facility])    feet  . Pneumonia    "several times" (07/26/2014)  . Pulmonary embolism ([place])    2011 treated at [facility] in [place].  Was on Coumadin for over a year.  no known family history  . 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 Type II diabetes mellitus ([place]) dx'd 2000  . UTI (urinary tract infection)     Medications:  See med rec - was on Xarelto PTA but hx of noncompliance.  Last dose 10/20 at 1800.  Assessment: 65 yo F presented to ED after syncopal event.  Hypoxic on presentation and placed on NRB.  CXR in ER shows multifocal PNA.  Pt has hx of PE and reportedly noncompliance with Xarelto.  With worsening renal function, to change patient to Coumadin for hx VTE.  Last Xarelto dose reported  10/20 at 1800. Baseline heparin level is undetectable implying Xarelto is cleared and heparin levels can be used for monitoring heparin going forward.  Initial heparin level is subtherapeutic at 0.19 on heparin 1250 units/hr. Nurse reports no issues with infusion or bleeding.  Goal of Therapy:  INR 2-3 Heparin level 0.3-0.7 units/ml Monitor platelets by anticoagulation protocol: Yes    Plan:  Increase heparin to 1500 units/hr 8h HL Warfarin 7.5mg  tonight x1 Daily INR/HL/CBC  Andrey Cota. Diona Foley, PharmD, [place] Clinical Pharmacist Pager [phone] 10/11/2016 11:28 AM

## 2016-10-11 NOTE — Procedures (Signed)
Arterial Catheter Insertion Procedure Note Joy Butler [id number] [birth year]  Procedure: Insertion of Arterial Catheter  Indications: Blood pressure monitoring  Procedure Details Consent: Risks of procedure as well as the alternatives and risks of each were explained to the (patient/caregiver).  Consent for procedure obtained. Time Out: Verified patient identification, verified procedure, site/side was marked, verified correct patient position, special equipment/implants available, medications/allergies/relevent history reviewed, required imaging and test results available.  Performed  Maximum sterile technique was used including antiseptics, cap, gloves, gown, hand hygiene and mask. Skin prep: Chlorhexidine; local anesthetic administered 20 gauge catheter was inserted into left radial artery using the Seldinger technique.  Evaluation Blood flow good; BP tracing good. Complications: No apparent complications.   Mcneil Sober 10/11/2016

## 2016-10-11 NOTE — Progress Notes (Signed)
eLink Physician-Brief Progress Note Patient Name: Joy Butler DOB: 04/28/1951 MRN: [id number]   Date of Service  10/11/2016  HPI/Events of Note  Request to change Guaifenesin from pills to liquid.   eICU Interventions  Will change order to  Guaifenesin Solution 300 mg via tube Q 6 hours.      Intervention Category Minor Interventions: Routine modifications to care plan (e.g. PRN medications for pain, fever)  Supreme Rybarczyk Eugene 10/11/2016, 9:33 PM

## 2016-10-11 NOTE — Progress Notes (Signed)
PHARMACY - PHYSICIAN COMMUNICATION CRITICAL VALUE ALERT - BLOOD CULTURE IDENTIFICATION (BCID)  Results for orders placed or performed during the hospital encounter of 10/10/16  Blood Culture ID Panel (Reflexed) (Collected: 10/11/2016 12:05 AM)  Result Value Ref Range   Enterococcus species NOT DETECTED NOT DETECTED   Listeria monocytogenes NOT DETECTED NOT DETECTED   Staphylococcus species NOT DETECTED NOT DETECTED   Staphylococcus aureus NOT DETECTED NOT DETECTED   Streptococcus species DETECTED (A) NOT DETECTED   Streptococcus agalactiae NOT DETECTED NOT DETECTED   Streptococcus pneumoniae DETECTED (A) NOT DETECTED   Streptococcus pyogenes NOT DETECTED NOT DETECTED   Acinetobacter baumannii NOT DETECTED NOT DETECTED   Enterobacteriaceae species NOT DETECTED NOT DETECTED   Enterobacter cloacae complex NOT DETECTED NOT DETECTED   Escherichia coli NOT DETECTED NOT DETECTED   Klebsiella oxytoca NOT DETECTED NOT DETECTED   Klebsiella pneumoniae NOT DETECTED NOT DETECTED   Proteus species NOT DETECTED NOT DETECTED   Serratia marcescens NOT DETECTED NOT DETECTED   Haemophilus influenzae NOT DETECTED NOT DETECTED   Neisseria meningitidis NOT DETECTED NOT DETECTED   Pseudomonas aeruginosa NOT DETECTED NOT DETECTED   Candida albicans NOT DETECTED NOT DETECTED   Candida glabrata NOT DETECTED NOT DETECTED   Candida krusei NOT DETECTED NOT DETECTED   Candida parapsilosis NOT DETECTED NOT DETECTED   Candida tropicalis NOT DETECTED NOT DETECTED    Name of physician (or Provider) Contacted: none  Changes to prescribed antibiotics required: Pt currently on levofloxacin and vancomycin as empiric ABX - doses appropriate and cover strep pneumoniae  Bonnita Nasuti Pharm.D. CPP, BCPS Clinical Pharmacist [phone] 10/11/2016 5:24 PM

## 2016-10-11 NOTE — Progress Notes (Signed)
   Subjective: Patient was not feeling good when seen this morning. She complained of lower back pain and pain in her legs. She appeared  little drowsy, but was able to answer our questions. She states that her chest pain has improved, but she is still having difficulty in breathing. She was feeling uncomfortable with BiPAP.  Objective:  Vital signs in last 24 hours: Vitals:   10/11/16 0730 10/11/16 0745 10/11/16 0815 10/11/16 0830  BP: 128/84 (!) 110/54 106/58 (!) 101/53  Pulse: 98 93 98 94  Resp: 21 18    Temp:      TempSrc:      SpO2: 95% 95% 94% 95%   Gen. Current blood pressure is 89/41, with 93% saturation.           Looks tired and drowsy, with increased effort of                      Breathing. Lungs. Bilateral crackles and rhonchi. Patient was on BiPAP. CV.   Tachycardia. No murmur rub or gallop Abdomen. Soft, nontender, nondistended, bowel sounds positive. Neuro. Alert and oriented 3. Strength and sensations grossly intact.Exam was limited because she was very exhausted. Extremities. No edema, no cyanosis, pulses 2+ bilaterally.  Labs. ABG    Component Value Date/Time   PHART 7.293 (L) 10/11/2016 0900   PCO2ART 31.5 (L) 10/11/2016 0900   PO2ART 69.0 (L) 10/11/2016 0900   HCO3 15.2 (L) 10/11/2016 0900   TCO2 16 10/11/2016 0900   ACIDBASEDEF 10.0 (H) 10/11/2016 0900   O2SAT 92.0 10/11/2016 0900   CT chest. IMPRESSION: Extensive bilateral airspace consolidative changes most compatible with multifocal pneumonia. Clinical correlation and follow-up resolution recommended.  Mild cardiomegaly with multi vessel coronary artery disease. Assessment/Plan:  Joy Butler is a 65 yo female with PMHx of recurrent PE on chronic anticoagulation, HLD, CKD III, Depression, T2DM who presents after a syncopal episode at home.   CAP. Her CT chest confirmed extensive multifocal pneumonia. She started decompensating with becoming more acidotic, with increased effort of breathing and  becoming more lethargic and drowsy. PC CM was contacted, and they decided to intubate her. -We will transfer care to ICU.  Acute on Chronic Kidney Failure. She had an acute kidney injury with creatinine of 3.85. She  met the criteria for "sepsis. -IV fluid resuscitation.  Hypotension. Her blood pressure remains on the softer side. Gave her a bolus of normal saline. -ICU team is taking care of her hypotension, with Levophed.  Code Status. Full  Dispo: Anticipated discharge in approximately 2-3 day(s).   Lorella Nimrod, MD 10/11/2016, 9:27 AM Pager: [phone]

## 2016-10-11 NOTE — H&P (Signed)
Date: 10/11/2016               Patient Name:  Joy Butler MRN: [id number]  DOB: [birth year] Age / Sex: 65 y.o., female   PCP: [name], MD         Medical Service: Internal Medicine Teaching Service         Attending Physician: Dr. Aldine Contes, MD    First Contact: Dr. Reesa Chew Pager: [phone]  Second Contact: Dr. Juleen China Pager: [phone]       After Hours (After 5p/  First Contact Pager: [phone]  weekends / holidays): Second Contact Pager: [phone]   Chief Complaint: syncope  History of Present Illness: Ms. 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   Patient states she has been having shortness of breath, non productive cough, and R upper chest pain for about one week. Today when she fell, she states that her legs could not hold her up, she endorses associated diaphoresis, lightheadedness and worse chest pain. She denies sick contacts. She states that she has not taken her Xarelto for 5 days, and has not had anything to eat or taken her insulin in the last day. She reports poor PO intake over the last week in general.   EMS found patient to be hypoxic in the 70s at home and hypoglycemic in the 60s. In the ED, patient was satting 91% on non-rebreather, febrile to 100.8, normotensive, tachycardic to 111, and tachypneic. Patient was placed on BiPAP due to work of breathing.   Labs: WBC 18.7 with left shift, Hgb 13.1, Hct 37.0, Plt 397. Na 138, K 3.3, Cl 109, CO2 15, BUN 46, Cr 3.85 (B/l 2 - 2.4), Glu 58, Ca 7.8, Alb 1.6, Protein 6.3, ALT/AST Alk phos wnl, Bili 2.8. BNP 284.5. ABG: 7.358/24.9/57/13.8. iStat trop negative. LA 2.84.   Meds: Current Facility-Administered Medications  Medication Dose Route Frequency Provider Last Rate Last Dose  . 0.9 %  sodium chloride infusion  1,000 mL Intravenous Continuous [name], MD 125 mL/hr at 10/11/16 0101 1,000  mL at 10/11/16 0101  . vancomycin (VANCOCIN) IVPB 1000 mg/200 mL premix  1,000 mg Intravenous Once [name], MD 200 mL/hr at 10/11/16 0251 1,000 mg at 10/11/16 0251   Current Outpatient Prescriptions  Medication Sig Dispense Refill  . amLODipine (NORVASC) 10 MG tablet Take 1 tablet (10 mg total) by mouth daily. 90 tablet 3  . ferrous sulfate 325 (65 FE) MG tablet Take 1 tablet (325 mg total) by mouth daily. 30 tablet 3  . insulin NPH-regular Human (NOVOLIN 70/30) (70-30) 100 UNIT/ML injection Inject 25 Units into the skin 2 (two) times daily with a meal. 30 mL 1  . loratadine (CLARITIN) 10 MG tablet TAKE 1 TABLET BY MOUTH ONCE DAILY 30 tablet 1  . losartan-hydrochlorothiazide (HYZAAR) 100-25 MG tablet TAKE 1 TABLET BY MOUTH EVERY DAY 90 tablet 1  . pravastatin (PRAVACHOL) 40 MG tablet TAKE 1 TABLET BY MOUTH EVERY EVENING 90 tablet 0  . Rivaroxaban (XARELTO) 15 MG TABS tablet Take 1 tablet (15 mg total) by mouth daily with supper. Please counsel on adherence 60 tablet 3    Allergies: Allergies as of 10/10/2016 - Review Complete 10/10/2016  Allergen Reaction Noted  . Keflex [cephalexin] Itching and Swelling 08/19/2012  . Clindamycin/lincomycin Itching 07/27/2014  . Latex Itching 09/01/2012  . Sulfa antibiotics Rash 08/16/2012  Past Medical History:  Diagnosis Date  . Arthritis    "knees" (07/26/2014)  . Cataract of both eyes   . Chronic back pain   . Daily headache   . Depression   . High cholesterol   . Hypertension   . Hypertensive emergency 12/03/2015  . Kidney stones   . Migraine    "last one was in the 1990's" (07/26/2014)  . Neuropathy (HCC)    feet  . Pneumonia    "several times" (07/26/2014)  . Pulmonary embolism ([place])    2011 treated at [facility] in [place].  Was on Coumadin for over a year.  no known family history  . 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 Type II diabetes mellitus ([place]) dx'd 2000  . UTI (urinary tract infection)     Family History:  Denies family history of blood clots, heart disease or stroke. Family History  Problem Relation Age of Onset  . Adopted: Yes  . Hypertension Mother     Social History:  Tobacco Use: 1 ppd  Alcohol Use: Denies Illicit Drug Use: Denies  Review of Systems: A complete ROS was negative except as per HPI.   Physical Exam: Vitals:   10/11/16 0100 10/11/16 0104 10/11/16 0115 10/11/16 0200  BP:  113/64 116/65 115/67  Pulse: 106 102 103 96  Resp: (!) 28 (!) 28 26 (!) 30  Temp:      TempSrc:      SpO2: 98% 98% 98% 97%   General: Vital signs reviewed.  Patient is well-developed and well-nourished, in mild distress over tight BiPAP mask and cooperative with exam.  Head: Normocephalic and atraumatic. Eyes: EOMI, no scleral icterus.  Neck: Supple, trachea midline.  Cardiovascular: RRR, S1 normal, S2 normal, no murmurs, gallops, or rubs. Pulmonary/Chest: Diffuse crackles over R lung, and lower left lung, no wheezing appreciated. Pt on BiPAP at evaluation. Abdominal: Soft, non-tender, non-distended, BS +, no masses, organomegaly, or guarding present.  Musculoskeletal: No joint deformities, erythema, or stiffness, ROM full and nontender. Extremities: Minimal lower extremity edema bilaterally,  pulses symmetric and intact bilaterally. No cyanosis or clubbing. Neurological: A&O x3, Strength is normal and symmetric bilaterally, difficult to fully assess as patient on BiPAP no focal motor deficit, sensory intact to light touch bilaterally.  Skin: Warm, dry and intact. No rashes or erythema. Psychiatric: Normal mood and affect.  Cognition and memory are normal.   EKG: I reviewed the EKG personally and agree with report - Sinus tachycardia  CXR: I reviewed the CXR personally and agree with report - consistent with multifocal pneumonia R>L  Assessment & Plan by Problem: Principal Problem:   CAP (community acquired pneumonia) Active Problems:   DM (diabetes mellitus), type 2 ([place])    Hypercholesteremia   Depression   Acute kidney injury superimposed on chronic kidney disease ([place])   Severe sepsis with acute organ dysfunction [facility])  Ms. 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   CAP: Patient with five day history of shortness of breath, chest pain and cough presented to the ED with syncopal episode and found to be hypoxic and febrile. CXR consistent with multifocal pneumonia. 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 Patient has  had decreased PO intake over the last week. --IVF NS 128m/hr  History of Recurrent PE: Patient is on chronic anticoagulation for recurrent PEs. She previously was on Xarelto 15 mg QD; however, given her worsening renal function, patient is no longer a candidate. Patient reports noncompliance with Xarelto - last dose was 5 days prior to admission. She is at increased risk of PE and presentation could be consistent with PE with syncope associated with shortness of breath and chest pain, tachycardia and mild fever. Well's score for PE is 3, putting her at moderate risk. Patient is still going to be started on heparin ggt as not candidate for Xarelto due to poor renal function. Patient not a candidate for VQ scan or CT angio due to poor renal function and CAP. --Heparin ggt with bridge to coumadin --Chest CT   T2DM: Well controlled on home Novolin 25 units BID. However, patient is hypoglycemic in the 50-60s in the ED. She received one ampule of dextrose 50%. --SSI-M --monitor CBG's  HTN: Patient with history of hypertension on losartan-HCTZ 100-28mdaily and amlodipine 1030maily. BP on admission controlled at 115/67.  --continue amlodipine 83m58maily --resume losartan-HCTZ if becomes hypertensive  Hyperlipidemia: Patient on pravastatin 40mg2mly --continue pravastatin 40mg 83my.  Hypokalemia: K 3.3 on admission.  --Kdur 40 mEq once  DVT/PE ppx: heparin ggt FEN: 125ml/h75mPO CODE: DNR - has previously been FULL at other admissions.  Dispo: Admit patient to Inpatient with expected length of stay greater than 2 midnights.  Signed: Kiaja Shorty Alphonzo GrieveTS - PGY1 Pager [phone]

## 2016-10-11 NOTE — Progress Notes (Signed)
RT attempted to place aline times 3 attempts between 2 RTs with no success.  RN notified and MD.  RT will continue to monitor.

## 2016-10-11 NOTE — Progress Notes (Addendum)
ANTICOAGULATION and ANTIBIOTIC CONSULT NOTE - Initial Consult  Pharmacy Consult for Heparin >> Coumadin, Vancomycin and Levaquin Indication: pulmonary embolus (history) and PNA  Allergies  Allergen Reactions  . Keflex [Cephalexin] Itching and Swelling    Lips and eyes swelled up  . Clindamycin/Lincomycin Itching  . Latex Itching  . Sulfa Antibiotics Rash    Patient Measurements: Height = 69 inches Weight = 94 kg IBW = 66.2 kg Heparin Dosing Weight: 86 kg  Vital Signs: Temp: 100.6 F (38.1 C) (10/22 0041) Temp Source: Rectal (10/22 0014) BP: 115/67 (10/22 0200) Pulse Rate: 96 (10/22 0200)  Labs:  Recent Labs  10/10/16 0005  HGB 13.1  HCT 37.0  PLT 397  CREATININE 3.85*    CrCl cannot be calculated (Unknown ideal weight.).   Medical History: Past Medical History:  Diagnosis Date  . Arthritis    "knees" (07/26/2014)  . Cataract of both eyes   . Chronic back pain   . Daily headache   . Depression   . High cholesterol   . Hypertension   . Hypertensive emergency 12/03/2015  . Kidney stones   . Migraine    "last one was in the 1990's" (07/26/2014)  . Neuropathy (HCC)    feet  . Pneumonia    "several times" (07/26/2014)  . Pulmonary embolism ([place])    2011 treated at [facility] in [place].  Was on Coumadin for over a year.  no known family history  . 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 Type II diabetes mellitus ([place]) dx'd 2000  . UTI (urinary tract infection)     Medications:  See med rec - was on Xarelto PTA but hx of noncompliance.  Last dose 10/20 at 1800.  Assessment: 65 yo F presented to ED after syncopal event.  Hypoxic on presentation and placed on NRB.  CXR in ER shows multifocal PNA.  Pt has hx of PE and reportedly noncompliance with Xarelto.  With worsening renal function, to change patient to Coumadin for hx VTE.  Last Xarelto dose reported ly 10/20 at 1800.  Will obtain baseline labs to determine if residual drug remains in  system.  Goal of Therapy:  INR 2-3 Heparin level 0.3-0.7 units/ml Monitor platelets by anticoagulation protocol: Yes  Vancomycin trough 15-20 mcg/ml   Plan:  Baseline heparin level, aptt, INR Heparin at 1250 units/hr Heparin level in 8 hours Coumadin 7.5mg  PO x 1 tonight Heparin level (or aptt) daily, CBC and INR daily Vanc 1gm IV q48h - next dose 10/24 Levaquin 500mg  IV q48h - next dose 10/24  Catha Ontko, Pharm.D., BCPS Clinical Pharmacist Pager [phone] 10/11/2016 4:50 AM   Addendum: Baseline heparin level is undetectable implying Xarelto is cleared and heparin levels can be used for monitoring heparin going forward.  [employer], Pharm.D., BCPS Clinical Pharmacist Pager [phone] 10/11/2016 6:25 AM

## 2016-10-11 NOTE — ED Notes (Signed)
Dr. Cardama at bedside.  

## 2016-10-12 ENCOUNTER — Ambulatory Visit (HOSPITAL_COMMUNITY): Payer: Medicare Other

## 2016-10-12 ENCOUNTER — Inpatient Hospital Stay (HOSPITAL_COMMUNITY): Payer: Medicare Other

## 2016-10-12 DIAGNOSIS — J983 Compensatory emphysema: Secondary | ICD-10-CM

## 2016-10-12 DIAGNOSIS — J189 Pneumonia, unspecified organism: Secondary | ICD-10-CM

## 2016-10-12 LAB — RESPIRATORY PANEL BY PCR
Adenovirus: NOT DETECTED
BORDETELLA PERTUSSIS-RVPCR: NOT DETECTED
CORONAVIRUS 229E-RVPPCR: NOT DETECTED
CORONAVIRUS HKU1-RVPPCR: NOT DETECTED
CORONAVIRUS NL63-RVPPCR: NOT DETECTED
CORONAVIRUS OC43-RVPPCR: NOT DETECTED
Chlamydophila pneumoniae: NOT DETECTED
Influenza A: NOT DETECTED
Influenza B: NOT DETECTED
METAPNEUMOVIRUS-RVPPCR: NOT DETECTED
Mycoplasma pneumoniae: NOT DETECTED
PARAINFLUENZA VIRUS 1-RVPPCR: NOT DETECTED
PARAINFLUENZA VIRUS 2-RVPPCR: NOT DETECTED
PARAINFLUENZA VIRUS 3-RVPPCR: NOT DETECTED
Parainfluenza Virus 4: NOT DETECTED
RHINOVIRUS / ENTEROVIRUS - RVPPCR: NOT DETECTED
Respiratory Syncytial Virus: NOT DETECTED

## 2016-10-12 LAB — PHOSPHORUS: PHOSPHORUS: 4.6 mg/dL (ref 2.5–4.6)

## 2016-10-12 LAB — POCT I-STAT 3, ART BLOOD GAS (G3+)
ACID-BASE DEFICIT: 8 mmol/L — AB (ref 0.0–2.0)
ACID-BASE EXCESS: 22 mmol/L — AB (ref 0.0–2.0)
BICARBONATE: 16.6 mmol/L — AB (ref 20.0–28.0)
Bicarbonate: 48.1 mmol/L — ABNORMAL HIGH (ref 20.0–28.0)
O2 Saturation: 93 %
O2 Saturation: 97 %
PCO2 ART: 30.8 mmHg — AB (ref 32.0–48.0)
PCO2 ART: 65.2 mmHg — AB (ref 32.0–48.0)
PH ART: 7.341 — AB (ref 7.350–7.450)
PH ART: 7.476 — AB (ref 7.350–7.450)
PO2 ART: 68 mmHg — AB (ref 83.0–108.0)
PO2 ART: 91 mmHg (ref 83.0–108.0)
Patient temperature: 98.6
Patient temperature: 98.8
TCO2: 18 mmol/L (ref 0–100)
TCO2: >50 mmol/L (ref 0–100)

## 2016-10-12 LAB — CBC
HEMATOCRIT: 27.8 % — AB (ref 36.0–46.0)
Hemoglobin: 9.7 g/dL — ABNORMAL LOW (ref 12.0–15.0)
MCH: 27.2 pg (ref 26.0–34.0)
MCHC: 34.9 g/dL (ref 30.0–36.0)
MCV: 77.9 fL — AB (ref 78.0–100.0)
PLATELETS: 274 10*3/uL (ref 150–400)
RBC: 3.57 MIL/uL — ABNORMAL LOW (ref 3.87–5.11)
RDW: 16.2 % — AB (ref 11.5–15.5)
WBC: 20.9 10*3/uL — ABNORMAL HIGH (ref 4.0–10.5)

## 2016-10-12 LAB — BLOOD GAS, ARTERIAL
ACID-BASE DEFICIT: 7.8 mmol/L — AB (ref 0.0–2.0)
Bicarbonate: 15.7 mmol/L — ABNORMAL LOW (ref 20.0–28.0)
DRAWN BY: 418751
FIO2: 40
MECHVT: 580 mL
O2 Saturation: 95.4 %
PCO2 ART: 23.8 mmHg — AB (ref 32.0–48.0)
PEEP: 8 cmH2O
PH ART: 7.434 (ref 7.350–7.450)
Patient temperature: 98.6
RATE: 30 resp/min
pO2, Arterial: 79.6 mmHg — ABNORMAL LOW (ref 83.0–108.0)

## 2016-10-12 LAB — GLUCOSE, CAPILLARY
GLUCOSE-CAPILLARY: 75 mg/dL (ref 65–99)
GLUCOSE-CAPILLARY: 77 mg/dL (ref 65–99)
GLUCOSE-CAPILLARY: 81 mg/dL (ref 65–99)
GLUCOSE-CAPILLARY: 115 mg/dL — AB (ref 65–99)
Glucose-Capillary: 89 mg/dL (ref 65–99)
Glucose-Capillary: 63 mg/dL — ABNORMAL LOW (ref 65–99)

## 2016-10-12 LAB — TROPONIN I: Troponin I: 0.23 ng/mL (ref <0.03)

## 2016-10-12 LAB — LEGIONELLA PNEUMOPHILA SEROGP 1 UR AG: L. pneumophila Serogp 1 Ur Ag: NEGATIVE

## 2016-10-12 LAB — BASIC METABOLIC PANEL
ANION GAP: 12 (ref 5–15)
Anion gap: 8 (ref 5–15)
BUN: 53 mg/dL — AB (ref 6–20)
BUN: 55 mg/dL — ABNORMAL HIGH (ref 6–20)
CALCIUM: 6.7 mg/dL — AB (ref 8.9–10.3)
CALCIUM: 6.9 mg/dL — AB (ref 8.9–10.3)
CO2: 17 mmol/L — AB (ref 22–32)
CO2: 17 mmol/L — AB (ref 22–32)
CREATININE: 3.97 mg/dL — AB (ref 0.44–1.00)
CREATININE: 3.98 mg/dL — AB (ref 0.44–1.00)
Chloride: 114 mmol/L — ABNORMAL HIGH (ref 101–111)
Chloride: 117 mmol/L — ABNORMAL HIGH (ref 101–111)
GFR calc Af Amer: 12 mL/min — ABNORMAL LOW (ref >60)
GFR calc non Af Amer: 11 mL/min — ABNORMAL LOW (ref >60)
GFR, EST AFRICAN AMERICAN: 12 mL/min — AB (ref >60)
GFR, EST NON AFRICAN AMERICAN: 11 mL/min — AB (ref >60)
GLUCOSE: 82 mg/dL (ref 65–99)
Glucose, Bld: 92 mg/dL (ref 65–99)
Potassium: 2.6 mmol/L — CL (ref 3.5–5.1)
Potassium: 3.1 mmol/L — ABNORMAL LOW (ref 3.5–5.1)
SODIUM: 142 mmol/L (ref 135–145)
Sodium: 143 mmol/L (ref 135–145)

## 2016-10-12 LAB — HEPARIN LEVEL (UNFRACTIONATED)
Heparin Unfractionated: 0.35 IU/mL (ref 0.30–0.70)
Heparin Unfractionated: 0.39 IU/mL (ref 0.30–0.70)

## 2016-10-12 LAB — ECHOCARDIOGRAM COMPLETE
Height: 69 in
WEIGHTICAEL: 3432.12 [oz_av]

## 2016-10-12 LAB — URINE CULTURE

## 2016-10-12 LAB — PROTIME-INR
INR: 1.41
Prothrombin Time: 17.4 seconds — ABNORMAL HIGH (ref 11.4–15.2)

## 2016-10-12 LAB — PROCALCITONIN: PROCALCITONIN: 59.52 ng/mL

## 2016-10-12 LAB — MAGNESIUM: MAGNESIUM: 1.9 mg/dL (ref 1.7–2.4)

## 2016-10-12 MED ORDER — POTASSIUM CHLORIDE 10 MEQ/100ML IV SOLN
INTRAVENOUS | Status: AC
  Filled 2016-10-12: qty 400

## 2016-10-12 MED ORDER — POTASSIUM CHLORIDE 10 MEQ/100ML IV SOLN
10.0000 meq | INTRAVENOUS | Status: AC
  Administered 2016-10-12 (×6): 10 meq via INTRAVENOUS
  Filled 2016-10-12 (×3): qty 100

## 2016-10-12 MED ORDER — PRO-STAT SUGAR FREE PO LIQD
30.0000 mL | Freq: Three times a day (TID) | ORAL | Status: DC
  Administered 2016-10-12 – 2016-10-19 (×20): 30 mL
  Filled 2016-10-12 (×23): qty 30

## 2016-10-12 MED ORDER — DEXTROSE-NACL 5-0.9 % IV SOLN
INTRAVENOUS | Status: DC
  Administered 2016-10-12 – 2016-10-14 (×3): via INTRAVENOUS

## 2016-10-12 MED ORDER — DEXTROSE 50 % IV SOLN
50.0000 mL | Freq: Once | INTRAVENOUS | Status: AC
  Administered 2016-10-12: 50 mL via INTRAVENOUS

## 2016-10-12 MED ORDER — VITAL HIGH PROTEIN PO LIQD
1000.0000 mL | ORAL | Status: DC
  Administered 2016-10-12 – 2016-10-17 (×7): 1000 mL
  Administered 2016-10-17: 23:00:00
  Administered 2016-10-17: 1000 mL
  Administered 2016-10-18 (×2)
  Administered 2016-10-18: 1000 mL
  Administered 2016-10-18: 10:00:00
  Administered 2016-10-19: 1000 mL
  Administered 2016-10-19 (×5)
  Filled 2016-10-12 (×2): qty 1000

## 2016-10-12 MED ORDER — DEXTROSE 50 % IV SOLN
INTRAVENOUS | Status: AC
  Filled 2016-10-12: qty 50

## 2016-10-12 MED ORDER — WARFARIN SODIUM 7.5 MG PO TABS
7.5000 mg | ORAL_TABLET | Freq: Once | ORAL | Status: AC
  Administered 2016-10-12: 7.5 mg via ORAL
  Filled 2016-10-12: qty 1

## 2016-10-12 MED ORDER — MAGNESIUM SULFATE 2 GM/50ML IV SOLN
2.0000 g | Freq: Once | INTRAVENOUS | Status: AC
  Administered 2016-10-12: 2 g via INTRAVENOUS
  Filled 2016-10-12: qty 50

## 2016-10-12 MED ORDER — POTASSIUM CHLORIDE 10 MEQ/100ML IV SOLN
10.0000 meq | INTRAVENOUS | Status: AC
  Administered 2016-10-12 (×4): 10 meq via INTRAVENOUS

## 2016-10-12 NOTE — Progress Notes (Signed)
[place] for Heparin Indication: pulmonary embolus  Allergies  Allergen Reactions  . Keflex [Cephalexin] Itching and Swelling    Lips and eyes swelled up  . Clindamycin/Lincomycin Itching  . Latex Itching  . Sulfa Antibiotics Rash    Patient Measurements: Height = 69 inches Weight = 94 kg IBW = 66.2 kg Heparin Dosing Weight: 86 kg  Vital Signs: Temp: 97.9 F (36.6 C) (10/22 2134) Temp Source: Axillary (10/22 2134) BP: 106/62 (10/22 2330) Pulse Rate: 88 (10/22 2330)  Labs:  Recent Labs  10/10/16 0005 10/11/16 0520  10/11/16 1208 10/11/16 1429 10/11/16 1806 10/11/16 2226  HGB 13.1  --   --   --   --   --   --   HCT 37.0  --   --   --   --   --   --   PLT 397  --   --   --   --   --   --   APTT  --  42*  --   --   --   --   --   LABPROT  --  17.5*  --   --   --   --   --   INR  --  1.42  --   --   --   --   --   HEPARINUNFRC  --  <0.10*  --  0.19*  --   --  0.20*  CREATININE 3.85*  --   --  3.84*  --  3.99*  --   TROPONINI  --   --   < > 0.17* 2.63* 0.08* 0.62*  < > = values in this interval not displayed.  Estimated Creatinine Clearance: 16.5 mL/min (by C-G formula based on SCr of 3.99 mg/dL (H)).   Medical History: Past Medical History:  Diagnosis Date  . Arthritis    "knees" (07/26/2014)  . Cataract of both eyes   . Chronic back pain   . Daily headache   . Depression   . High cholesterol   . Hypertension   . Hypertensive emergency 12/03/2015  . Kidney stones   . Migraine    "last one was in the 1990's" (07/26/2014)  . Neuropathy (HCC)    feet  . Pneumonia    "several times" (07/26/2014)  . Pulmonary embolism ([place])    2011 treated at [facility] in [place].  Was on Coumadin for over a year.  no known family history  . 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 Type II diabetes mellitus ([place]) dx'd 2000  . UTI (urinary tract infection)     Medications:  See med rec - was on Xarelto PTA but hx of  noncompliance.  Last dose 10/20 at 1800.  Assessment: 65 yo F presented to ED after syncopal event.  Hypoxic on presentation and placed on NRB.  CXR in ER shows multifocal PNA.  Pt has hx of PE and reportedly noncompliance with Xarelto.  With worsening renal function, to change patient to Coumadin for hx VTE.  Last Xarelto dose reported 10/20 at 1800. Baseline heparin level is undetectable implying Xarelto is cleared and heparin levels can be used for monitoring heparin going forward.  Heparin level remains subtherapeutic on heparin 1500 units/hr. Nurse reports no issues with infusion or bleeding.  Goal of Therapy:  INR 2-3 Heparin level 0.3-0.7 units/ml Monitor platelets by anticoagulation protocol: Yes    Plan:  Increase heparin to 1800 units/hr  Heparin level in 8 hours  [employer], Pharm.D., BCPS Clinical Pharmacist Pager [phone] 10/12/2016 12:01 AM

## 2016-10-12 NOTE — Progress Notes (Signed)
PULMONARY / CRITICAL CARE MEDICINE   Name: Joy Butler MRN: [id number] DOB: [birth year]    ADMISSION DATE:  10/10/2016 CONSULTATION DATE:  10/11/16   REFERRING MD:  Dr. Reesa Chew / IMTS   CHIEF COMPLAINT:  Multi-focal PNA / Acute Respiratory Failure   HISTORY OF PRESENT ILLNESS:   [name], occasional cigarette smoking, with PMH of arthritis, cataracts, chronic back pain, migraines, depression, HLD, HTN, splenic infarct, DM II, PE (2011, on xarelto with intermittent compliance) who presented to [facility] on [facility]22 with reports of lower extremity pain and shortness of breath. The patient also reported a possible syncopal episode.  She presented to the ER via EMS. Per report, she had a memory of the fall. EMS found the patient to be hypoxic with room air saturations of 80%. She does not wear home oxygen at baseline. She was placed on a nonrebreather with improvement to 90%. The patient reported development of a cough, weakness and "not feeling well" one week prior to presentation. Initial ER evaluation found her to have increased work of breathing on exam with saturations in the 70s on room air. She had a mild elevation of lactic acid at 2.84 and rectal temperature of 100.8. The patient was found to have a chest x-ray concerning for multifocal pneumonia. Initially she was not hypotensive. However, later in the morning the patient developed hypotension and increased work of breathing. ABG evaluation demonstrated worsening acidosis. Initial labs-NA 138, K3.3, creatinine 3.85 (up from 2.46), bun 58, anion gap 14, alkaline phosphatase 123, albumin 1.6, BNP 284, troponin 0.07, WBC 18.7, hemoglobin 13.1, and platelets 397.  She felt nausea prior to intubation and vomited (the patient was able to notify staff, sat upright and secretions cleared via suction). Due to bilateral opacities, hypoxic respiratory failure and developing hypotension the decision was made to intubate the patient for airway protection.  PCCM  called for ICU admission.  SUBJECTIVE: Indicated HA this a.m. Indicated sensation of SOB with ETT. Has had increased secretions per nurse. Does have good cough and oriented off sedation this a.m. Has not yet had SBT. On 40 FiO2, PEEP of 8, RR 30, TV 580.   VITAL SIGNS: BP 131/70   Pulse 86   Temp 98.8 F (37.1 C) (Oral)   Resp (!) 30   Ht 5\' 9"  (1.753 m)   Wt 97.3 kg (214 lb 8.1 oz)   SpO2 100%   BMI 31.68 kg/m   HEMODYNAMICS:    VENTILATOR SETTINGS: Vent Mode: PRVC FiO2 (%):  [40 %-80 %] 40 % Set Rate:  [30 bmp] 30 bmp Vt Set:  [580 mL] 580 mL PEEP:  [5 cmH20-12 cmH20] 5 cmH20 Plateau Pressure:  [20 cmH20-25 cmH20] 25 cmH20  INTAKE / OUTPUT: I/O last 3 completed shifts: In: 5167.1 [I.V.:2617.1; IV Piggyback:2550] Out: 600 [Urine:600]  PHYSICAL EXAMINATION: General:  Ill appearing female, NAD Neuro: awake and alert, following commands  HEENT:  MM pink/dry, no jvd, ETT in place Cardiovascular:  s1s2 rrr, no m/r/g, mild tachycardia  Lungs: Coarse breath sounds throughout  Abdomen:  Obese/soft, mild diffuse TTP, bsx4 active  Musculoskeletal:  No acute deformities, BLE symmetrical  Skin:  Warm/dry, no edema  LABS:  BMET  Recent Labs Lab 10/11/16 1208 10/11/16 1806 10/12/16 0400  NA 140 143 143  K 3.3* 2.8* 2.6*  CL 113* 112* 114*  CO2 12* 17* 17*  BUN 49* 51* 53*  CREATININE 3.84* 3.99* 3.97*  GLUCOSE 105* 114* 82    Electrolytes  Recent Labs  Lab 10/11/16 1208 10/11/16 1806 10/12/16 0400  CALCIUM 7.4* 6.8* 6.7*  MG  --   --  1.9  PHOS  --   --  4.6   CBC  Recent Labs Lab 10/10/16 0005 10/12/16 0400  WBC 18.7* 20.9*  HGB 13.1 9.7*  HCT 37.0 27.8*  PLT 397 274   Coag's  Recent Labs Lab 10/11/16 0520 10/12/16 0400  APTT 42*  --   INR 1.42 1.41   Sepsis Markers  Recent Labs Lab 10/11/16 0526 10/11/16 1208 10/11/16 1429 10/12/16 0400  LATICACIDVEN 2.10* 2.2* 2.2*  --   PROCALCITON  --  42.77  --  59.52   ABG  Recent  Labs Lab 10/11/16 2040 10/12/16 0016 10/12/16 0313  PHART 7.404 7.476* 7.434  PCO2ART 23.7* 65.2* 23.8*  PO2ART 134* 68.0* 79.6*   Liver Enzymes  Recent Labs Lab 10/10/16 0005 10/11/16 1208  AST 25 14*  ALT 13* 12*  ALKPHOS 123 112  BILITOT 2.8* 2.5*  ALBUMIN 1.6* 1.5*   Cardiac Enzymes  Recent Labs Lab 10/11/16 1806 10/11/16 2226 10/12/16 0400  TROPONINI 0.08* 0.62* 0.23*   Glucose  Recent Labs Lab 10/11/16 1651 10/11/16 2130 10/11/16 2359 10/12/16 0407 10/12/16 0733 10/12/16 0813  GLUCAP 118* 108* 79 75 63* 89   Imaging Ct Head Wo Contrast  Result Date: 10/12/2016 CLINICAL DATA:  Recent altered mental status and now in coma EXAM: CT HEAD WITHOUT CONTRAST TECHNIQUE: Contiguous axial images were obtained from the base of the skull through the vertex without intravenous contrast. COMPARISON:  4/16/ 17 FINDINGS: Brain: No evidence of acute infarction, hemorrhage, hydrocephalus, extra-axial collection or mass lesion/mass effect. Chronic atrophic and ischemic changes are again noted. Bilateral basal ganglia calcifications are seen and stable. Vascular: No hyperdense vessel or unexpected calcification. Skull: Normal. Negative for fracture or focal lesion. Sinuses/Orbits: No acute finding. Other: None. IMPRESSION: Chronic atrophic and ischemic changes similar to that noted on the prior exam. No acute abnormality is noted. Electronically Signed   By: Inez Catalina M.D.   On: 10/12/2016 07:05   Dg Chest Port 1 View  Result Date: 10/12/2016 CLINICAL DATA:  Respiratory failure. EXAM: PORTABLE CHEST 1 VIEW COMPARISON:  10/11/2016. FINDINGS: Endotracheal tube and NG tube in stable position. Cardiomegaly with diffuse bilateral pulmonary alveolar infiltrates noted consistent with pulmonary edema. No pleural effusion or pneumothorax. IMPRESSION: 1. Lines and tubes in stable position. 2. Cardiomegaly with diffuse bilateral pulmonary alveolar infiltrates consistent with pulmonary  edema. Similar findings noted on prior exams. Electronically Signed   By: Marcello Moores  Register   On: 10/12/2016 07:45   Dg Abd Portable 1v  Result Date: 10/11/2016 CLINICAL DATA:  NG tube placement EXAM: PORTABLE ABDOMEN - 1 VIEW COMPARISON:  None. FINDINGS: Scattered large and small bowel gas is noted. Nasogastric catheter is seen within the stomach. No acute bony abnormality is seen. IMPRESSION: Nasogastric catheter within the stomach. Electronically Signed   By: Inez Catalina M.D.   On: 10/11/2016 12:50   STUDIES:  ECHO 10/22 >>  CT Chest 10/22 >> extensive bilateral consolidative changes consistent with multifocal pneumonia, mild cardiomegaly with multivessel coronary disease  CULTURES: Influenza 10/22: negative BCx2 10/22 >> strep pneumo UA 10/22: proteinuria, small hgb, moderate bilirubin, negative nitrites and leukocytes; rbc 0-5 on add-on UC 10/22 >> Strep pneumo urinary Ag 10/22: positive Respiratory panel 10/22 >>  ANTIBIOTICS: Vanco 10/22 >>  Levaquin 10/22 >>   SIGNIFICANT EVENTS: 10/22  Admit per IMTS with PNA, failed BiPAP & required intubation  LINES/TUBES: ETT 10/22 >>  PIV x 2 Foley 10/22 >>  DISCUSSION: [name] with PMH of PE (non-compliant with xarelto), HTN, HLD admitted 10/22 per IMTS with multifocal PNA.  Developed worsening respiratory failure requiring intubation in ER.    ASSESSMENT / PLAN:  PULMONARY A: Acute Hypoxic Respiratory Failure - in the setting of multi-focal PNA  Multi-focal PNA - R>L opacities  Hx PE - on Xarelto, non-compliant with anticoagulation  Strep pneumo positive with urine Ag testing P:   PRVC 8 cc/kg  Wean PEEP / FiO2 for sats > 92% Trend CXR - stable Decrease RR to 18 See ID  Heparin gtt per pharmacy for hx PE  Duonebs q6h Guaifenesin PT q6h  CARDIOVASCULAR A:  Septic Shock - in setting of multifocal PNA Syncopal Episode - prior to admit Hx HTN, HLD P:  ICU monitoring of hemodynamics  KVO IVF once TF are  started Levophed for MAP >65; stopped and started 10/22  Assess ECHO to review RV (hx non-compliance with anticoagulation) Hold home norvasc  RENAL A:   Acute Kidney Injury  - baseline sr cr ~ 2.4 Hypokalemia Pseudo-hypocalcemia - corrects normal (8.7) for hypoalbuminemia P:   Trend BMP / UOP  Replace electrolytes as indicated - 6 runs of K ordered KVO IVF given given hyperchloremia  GASTROINTESTINAL A:   Protein Calorie Malnutrition  Nausea / Vomiting  P:   NPO  Place OGT  TF per nutrition PRN zofran for nausea / vomiting   HEMATOLOGIC A:   Recurrent PE - on Xarelto, non-compliant P:  Trend CBC  Heparin gtt per pharmacy  Bridge to warfarin via heparin per pharmacy   INFECTIOUS A:   Multifocal PNA  P:   Follow pan cultures  Abx as above, note allergies (keflex - facial swelling, clinda, sulfa) Trend PCT : 42.77 > 59.52  ENDOCRINE A:   DM    Hypoglycemia to 75 this am; got 25 mg D50 P:   SSI  Add glucose to fluids  NEUROLOGIC A:   Chronic Back Pain Migraines   P:   RASS goal: 0 to -1  PRN versed/fentanyl for sedation / pain  Serial neuro exams   FAMILY  - Updates: No family at bedside.   - Inter-disciplinary family meet or Palliative Care meeting due by:  10/29  The patient is critically ill with multiple organ systems failure and requires high complexity decision making for assessment and support, frequent evaluation and titration of therapies, application of advanced monitoring technologies and extensive interpretation of multiple databases.   Critical Care Time devoted to patient care services described in this note is  35  Minutes. This time reflects time of care of this signee Dr Jennet Maduro. This critical care time does not reflect procedure time, or teaching time or supervisory time of PA/NP/Med student/Med Resident etc but could involve care discussion time.  Rush Farmer, M.D. [facility] Pulmonary/Critical Care Medicine. Pager:  [phone]. After hours pager: [phone].

## 2016-10-12 NOTE — Progress Notes (Signed)
CBG Low (62). MD at bedside, D50 Amp administered per protocol. CGB now 89

## 2016-10-12 NOTE — Progress Notes (Signed)
Initial Nutrition Assessment  DOCUMENTATION CODES:   Obesity unspecified  INTERVENTION:    Initiate TF via OGT with Vital High Protein at goal rate of 45 ml/h (1080 ml per day) and Prostat 30 ml TID to provide 1380 kcals, 140 gm protein, 903 ml free water daily.  NUTRITION DIAGNOSIS:   Inadequate oral intake related to inability to eat as evidenced by NPO status.  GOAL:   Provide needs based on ASPEN/SCCM guidelines  MONITOR:   Vent status, Labs, TF tolerance, I & O's  REASON FOR ASSESSMENT:   Consult Enteral/tube feeding initiation and management  ASSESSMENT:   [name], occasional cigarette smoking, with PMH of arthritis, cataracts, chronic back pain, migraines, depression, HLD, HTN, splenic infarct, DM II, PE (2011, on xarelto with intermittent compliance) who presented to [facility] on 10/22 with reports of lower extremity pain and shortness of breath. Admitted with multi-focal PNA / acute respiratory failure.  Discussed patient in ICU rounds and with RN today. Received MD Consult for TF initiation and management. Nutrition-Focused physical exam completed. Findings are no fat depletion, no muscle depletion, and no edema.  Patient is currently intubated on ventilator support MV: 10 L/min Temp (24hrs), Avg:98.5 F (36.9 C), Min:97.9 F (36.6 C), Max:98.9 F (37.2 C)  Diet Order:  Diet NPO time specified  Skin:  Reviewed, no issues  Last BM:  10/22  Height:   Ht Readings from Last 1 Encounters:  10/11/16 5\' 9"  (1.753 m)    Weight:   Wt Readings from Last 1 Encounters:  10/12/16 214 lb 8.1 oz (97.3 kg)    Ideal Body Weight:  65.9 kg  BMI:  Body mass index is 31.68 kg/m.  Estimated Nutritional Needs:   Kcal:  [id number]  Protein:  132 gm  Fluid:  1.8-2 L  EDUCATION NEEDS:   No education needs identified at this time  Molli Barrows, [place], [place], [place] Pager [phone] After Hours Pager [phone]

## 2016-10-12 NOTE — Progress Notes (Signed)
eLink Physician-Brief Progress Note Patient Name: Joy Butler DOB: 1951/08/08 MRN: [id number]   Date of Service  10/12/2016  HPI/Events of Note  Repeat K  Now lower at 2.6  eICU Interventions  6 more runs of K. Recheck BMP.        Shaquina Gillham 10/12/2016, 5:13 AM

## 2016-10-12 NOTE — Progress Notes (Signed)
PULMONARY / CRITICAL CARE MEDICINE   Name: CLORISA ANTUNEZ MRN: [id number] DOB: 06-17-1951    ADMISSION DATE:  10/10/2016 CONSULTATION DATE:  10/11/16   REFERRING MD:  Dr. Reesa Chew / IMTS   CHIEF COMPLAINT:  Multi-focal PNA / Acute Respiratory Failure   HISTORY OF PRESENT ILLNESS:   [name], occasional cigarette smoking, with PMH of arthritis, cataracts, chronic back pain, migraines, depression, HLD, HTN, splenic infarct, DM II, PE (2011, on xarelto with intermittent compliance) who presented to [facility] on 10/22 with reports of lower extremity pain and shortness of breath. The patient also reported a possible syncopal episode.  She presented to the ER via EMS. Per report, she had a memory of the fall. EMS found the patient to be hypoxic with room air saturations of 80%. She does not wear home oxygen at baseline. She was placed on a nonrebreather with improvement to 90%. The patient reported development of a cough, weakness and "not feeling well" one week prior to presentation. Initial ER evaluation found her to have increased work of breathing on exam with saturations in the 70s on room air. She had a mild elevation of lactic acid at 2.84 and rectal temperature of 100.8. The patient was found to have a chest x-ray concerning for multifocal pneumonia. Initially she was not hypotensive. However, later in the morning the patient developed hypotension and increased work of breathing. ABG evaluation demonstrated worsening acidosis. Initial labs-NA 138, K3.3, creatinine 3.85 (up from 2.46), bun 58, anion gap 14, alkaline phosphatase 123, albumin 1.6, BNP 284, troponin 0.07, WBC 18.7, hemoglobin 13.1, and platelets 397.  She felt nausea prior to intubation and vomited (the patient was able to notify staff, sat upright and secretions cleared via suction). Due to bilateral opacities, hypoxic respiratory failure and developing hypotension the decision was made to intubate the patient for airway protection.  PCCM  called for ICU admission.  PAST MEDICAL HISTORY :  She  has a past medical history of Arthritis; Cataract of both eyes; Chronic back pain; Daily headache; Depression; High cholesterol; Hypertension; Hypertensive emergency (12/03/2015); Kidney stones; Migraine; Neuropathy ([place]); Pneumonia; Pulmonary embolism ([place]); Splenic infarction; Type II diabetes mellitus ([place]) (dx'd 2000); and UTI (urinary tract infection).  PAST SURGICAL HISTORY: She  has a past surgical history that includes Cholecystectomy; Esophagogastroduodenoscopy (08/19/2012); Cesarean section (1982); Carpal tunnel release (Bilateral); Tonsillectomy; Artery Biopsy (Right, 05/09/2014); Appendectomy; Vaginal hysterectomy; Dilation and curettage of uterus (1982); Cystoscopy w/ stone manipulation; Breast biopsy (Left); Temporal artery biopsy / ligation (Right, 04/2014); Eye surgery (Bilateral); Refractive surgery (Bilateral); Esophagogastroduodenoscopy (egd) with propofol (N/A, 12/06/2015); Colonoscopy with propofol (N/A, 12/06/2015); and Incision and drainage abscess (N/A, 03/28/2016).  Allergies  Allergen Reactions  . Keflex [Cephalexin] Itching and Swelling    Lips and eyes swelled up  . Clindamycin/Lincomycin Itching  . Latex Itching  . Sulfa Antibiotics Rash    No current facility-administered medications on file prior to encounter.    Current Outpatient Prescriptions on File Prior to Encounter  Medication Sig  . amLODipine (NORVASC) 10 MG tablet Take 1 tablet (10 mg total) by mouth daily.  . ferrous sulfate 325 (65 FE) MG tablet Take 1 tablet (325 mg total) by mouth daily.  . insulin NPH-regular Human (NOVOLIN 70/30) (70-30) 100 UNIT/ML injection Inject 25 Units into the skin 2 (two) times daily with a meal.  . loratadine (CLARITIN) 10 MG tablet TAKE 1 TABLET BY MOUTH ONCE DAILY  . losartan-hydrochlorothiazide (HYZAAR) 100-25 MG tablet TAKE 1 TABLET BY MOUTH EVERY DAY  .  pravastatin (PRAVACHOL) 40 MG tablet TAKE 1 TABLET BY MOUTH  EVERY EVENING  . Rivaroxaban (XARELTO) 15 MG TABS tablet Take 1 tablet (15 mg total) by mouth daily with supper. Please counsel on adherence    FAMILY HISTORY:  She is adopted.    SOCIAL HISTORY: She  reports that she has been smoking Cigarettes.  She has a 11.00 pack-year smoking history. She has never used smokeless tobacco. She reports that she does not drink alcohol or use drugs.  REVIEW OF SYSTEMS:  Unable to complete as patient is on mechanical ventilation   SUBJECTIVE: Indicated HA this a.m. Indicated sensation of SOB with ETT. Has had increased secretions per nurse. Does have good cough and oriented off sedation this a.m. Has not yet had SBT. On 40 FiO2, PEEP of 8, RR 30, TV 580.   VITAL SIGNS: BP 125/63   Pulse 86   Temp 98.8 F (37.1 C) (Axillary)   Resp (!) 30   Ht 5\' 9"  (1.753 m)   Wt 214 lb 8.1 oz (97.3 kg)   SpO2 99%   BMI 31.68 kg/m   HEMODYNAMICS:    VENTILATOR SETTINGS: Vent Mode: PRVC FiO2 (%):  [40 %-100 %] 40 % Set Rate:  [20 bmp-30 bmp] 30 bmp Vt Set:  [580 mL] 580 mL PEEP:  [8 cmH20-14 cmH20] 8 cmH20 Plateau Pressure:  [20 [id number] cmH20] 23 cmH20  INTAKE / OUTPUT: I/O last 3 completed shifts: In: 5167.1 [I.V.:2617.1; IV Piggyback:2550] Out: 600 [Urine:600]  PHYSICAL EXAMINATION: General:  Ill appearing female  Neuro: awake and alert, following commands  HEENT:  MM pink/dry, no jvd, ETT in place Cardiovascular:  s1s2 rrr, no m/r/g, mild tachycardia  Lungs: Coarse breath sounds throughout  Abdomen:  Obese/soft, mild diffuse TTP, bsx4 active  Musculoskeletal:  No acute deformities, BLE symmetrical  Skin:  Warm/dry, no edema  LABS:  BMET  Recent Labs Lab 10/11/16 1208 10/11/16 1806 10/12/16 0400  NA 140 143 143  K 3.3* 2.8* 2.6*  CL 113* 112* 114*  CO2 12* 17* 17*  BUN 49* 51* 53*  CREATININE 3.84* 3.99* 3.97*  GLUCOSE 105* 114* 82    Electrolytes  Recent Labs Lab 10/11/16 1208 10/11/16 1806 10/12/16 0400  CALCIUM 7.4*  6.8* 6.7*  MG  --   --  1.9  PHOS  --   --  4.6    CBC  Recent Labs Lab 10/10/16 0005 10/12/16 0400  WBC 18.7* 20.9*  HGB 13.1 9.7*  HCT 37.0 27.8*  PLT 397 274    Coag's  Recent Labs Lab 10/11/16 0520 10/12/16 0400  APTT 42*  --   INR 1.42 1.41    Sepsis Markers  Recent Labs Lab 10/11/16 0526 10/11/16 1208 10/11/16 1429  LATICACIDVEN 2.10* 2.2* 2.2*  PROCALCITON  --  42.77  --     ABG  Recent Labs Lab 10/11/16 2040 10/12/16 0016 10/12/16 0313  PHART 7.404 7.476* 7.434  PCO2ART 23.7* 65.2* 23.8*  PO2ART 134* 68.0* 79.6*    Liver Enzymes  Recent Labs Lab 10/10/16 0005 10/11/16 1208  AST 25 14*  ALT 13* 12*  ALKPHOS 123 112  BILITOT 2.8* 2.5*  ALBUMIN 1.6* 1.5*    Cardiac Enzymes  Recent Labs Lab 10/11/16 1806 10/11/16 2226 10/12/16 0400  TROPONINI 0.08* 0.62* 0.23*    Glucose  Recent Labs Lab 10/11/16 0733 10/11/16 1254 10/11/16 1651 10/11/16 2130 10/11/16 2359 10/12/16 0407  GLUCAP 100* 108* 118* 108* 79 75    Imaging Ct Head  Wo Contrast  Result Date: 10/12/2016 CLINICAL DATA:  Recent altered mental status and now in coma EXAM: CT HEAD WITHOUT CONTRAST TECHNIQUE: Contiguous axial images were obtained from the base of the skull through the vertex without intravenous contrast. COMPARISON:  4/16/ 17 FINDINGS: Brain: No evidence of acute infarction, hemorrhage, hydrocephalus, extra-axial collection or mass lesion/mass effect. Chronic atrophic and ischemic changes are again noted. Bilateral basal ganglia calcifications are seen and stable. Vascular: No hyperdense vessel or unexpected calcification. Skull: Normal. Negative for fracture or focal lesion. Sinuses/Orbits: No acute finding. Other: None. IMPRESSION: Chronic atrophic and ischemic changes similar to that noted on the prior exam. No acute abnormality is noted. Electronically Signed   By: Inez Catalina M.D.   On: 10/12/2016 07:05   Dg Chest Port 1 View  Result Date:  10/11/2016 CLINICAL DATA:  65 year old female status post intubation. EXAM: PORTABLE CHEST 1 VIEW COMPARISON:  Chest x-ray 10/11/2016. FINDINGS: An endotracheal tube is in place with tip 2.9 cm above the carina. Patchy multifocal consolidative airspace disease scattered throughout the lungs bilaterally, compatible with multilobar pneumonia, most confluent in the lower lobes of the lungs bilaterally and in the right upper lobe. No pleural effusions. No evidence of pulmonary edema. Heart size is borderline enlarged. Upper mediastinal contours are distorted by patient positioning. IMPRESSION: 1. Tip of endotracheal tube is 2.9 cm above the carina. 2. Multilobar pneumonia redemonstrated, as above, with little change compared to the prior examination. Electronically Signed   By: Vinnie Langton M.D.   On: 10/11/2016 10:47   Dg Abd Portable 1v  Result Date: 10/11/2016 CLINICAL DATA:  NG tube placement EXAM: PORTABLE ABDOMEN - 1 VIEW COMPARISON:  None. FINDINGS: Scattered large and small bowel gas is noted. Nasogastric catheter is seen within the stomach. No acute bony abnormality is seen. IMPRESSION: Nasogastric catheter within the stomach. Electronically Signed   By: Inez Catalina M.D.   On: 10/11/2016 12:50     STUDIES:  ECHO 10/22 >>  CT Chest 10/22 >> extensive bilateral consolidative changes consistent with multifocal pneumonia, mild cardiomegaly with multivessel coronary disease  CULTURES: Influenza 10/22: negative BCx2 10/22 >> gram positive cocci, too young to read UA 10/22: proteinuria, small hgb, moderate bilirubin, negative nitrites and leukocytes; rbc 0-5 on add-on UC 10/22 >> Strep pneumo urinary Ag 10/22: positive Respiratory panel 10/22 >>  ANTIBIOTICS: Vanco 10/22 >>  Levaquin 10/22 >>   SIGNIFICANT EVENTS: 10/22  Admit per IMTS with PNA, failed BiPAP & required intubation  LINES/TUBES: ETT 10/22 >>  PIV x 2 Foley 10/22 >>  DISCUSSION: [name] with PMH of PE  (non-compliant with xarelto), HTN, HLD admitted 10/22 per IMTS with multifocal PNA.  Developed worsening respiratory failure requiring intubation in ER.    ASSESSMENT / PLAN:  PULMONARY A: Acute Hypoxic Respiratory Failure - in the setting of multi-focal PNA  Multi-focal PNA - R>L opacities  Hx PE - on Xarelto, non-compliant with anticoagulation  Strep pneumo positive with urine Ag testing P:   PRVC 8 cc/kg  Wean PEEP / FiO2 for sats > 92% Trend CXR - stable May need low Vt ventilation  See ID  Heparin gtt per pharmacy for hx PE  Duonebs q6h Guaifenesin PT q6h  CARDIOVASCULAR A:  Septic Shock - in setting of multifocal PNA Syncopal Episode - prior to admit Hx HTN, HLD P:  ICU monitoring of hemodynamics  Fluids at 125 mL/hr S/p 2 L boluses Levophed for MAP >65; stopped and started 10/22  Assess  ECHO to review RV (hx non-compliance with anticoagulation) Hold home norvasc  RENAL A:   Acute Kidney Injury  - baseline sr cr ~ 2.4 Hypokalemia Pseudo-hypocalcemia - corrects normal (8.7) for hypoalbuminemia P:   Trend BMP / UOP  Replace electrolytes as indicated - 6 runs of K ordered  GASTROINTESTINAL A:   Protein Calorie Malnutrition  Nausea / Vomiting  P:   NPO  Place OGT  Nutrition order placed to start TFs - though may be able to be extubated PRN zofran for nausea / vomiting   HEMATOLOGIC A:   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   Staff reports no family have visited this am.    - Inter-disciplinary family meet or Palliative Care meeting due by:  10/29  Olene Floss, MD [place], PGY-2  Rush Farmer, M.D. [facility] Pulmonary/Critical Care Medicine. Pager: [phone]. After hours pager: [phone].

## 2016-10-12 NOTE — Progress Notes (Signed)
RT note-rate change per Dr. Nelda Marseille.

## 2016-10-12 NOTE — Progress Notes (Signed)
  Echocardiogram 2D Echocardiogram has been performed.  Jennette Dubin 10/12/2016, 9:41 AM

## 2016-10-12 NOTE — Progress Notes (Signed)
[place] for Heparin Indication: pulmonary embolus  Allergies  Allergen Reactions  . Keflex [Cephalexin] Itching and Swelling    Lips and eyes swelled up  . Clindamycin/Lincomycin Itching  . Latex Itching  . Sulfa Antibiotics Rash    Patient Measurements: Height = 69 inches Weight = 94 kg IBW = 66.2 kg Heparin Dosing Weight: 86 kg  Vital Signs: Temp: 98.9 F (37.2 C) (10/23 0735) Temp Source: Oral (10/23 0735) BP: 125/63 (10/23 0700) Pulse Rate: 86 (10/23 0700)  Labs:  Recent Labs  10/10/16 0005  10/11/16 0520 10/11/16 1208  10/11/16 1806 10/11/16 2226 10/12/16 0400 10/12/16 0730  HGB 13.1  --   --   --   --   --   --  9.7*  --   HCT 37.0  --   --   --   --   --   --  27.8*  --   PLT 397  --   --   --   --   --   --  274  --   APTT  --   --  42*  --   --   --   --   --   --   LABPROT  --   --  17.5*  --   --   --   --  17.4*  --   INR  --   --  1.42  --   --   --   --  1.41  --   HEPARINUNFRC  --   < > <0.10* 0.19*  --   --  0.20*  --  0.35  CREATININE 3.85*  --   --  3.84*  --  3.99*  --  3.97*  --   TROPONINI  --   --   --  0.17*  < > 0.08* 0.62* 0.23*  --   < > = values in this interval not displayed.  Estimated Creatinine Clearance: 17.1 mL/min (by C-G formula based on SCr of 3.97 mg/dL (H)).   Medical History: Past Medical History:  Diagnosis Date  . Arthritis    "knees" (07/26/2014)  . Cataract of both eyes   . Chronic back pain   . Daily headache   . Depression   . High cholesterol   . Hypertension   . Hypertensive emergency 12/03/2015  . Kidney stones   . Migraine    "last one was in the 1990's" (07/26/2014)  . Neuropathy (HCC)    feet  . Pneumonia    "several times" (07/26/2014)  . Pulmonary embolism ([place])    2011 treated at [facility] in [place].  Was on Coumadin for over a year.  no known family history  . 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 Type II diabetes mellitus ([place]) dx'd 2000  . UTI  (urinary tract infection)     Medications:  See med rec - was on Xarelto PTA but hx of noncompliance.  Last dose 10/20 at 1800.  Assessment: 65 yo F presented to ED after syncopal event and pneumonia with history of PE reportedly noncompliant with Xarelto prior to admission now to start IV Heparin and conversion to Coumadin per pharmacy consult.    Last Xarelto dose reported 10/20 at 1800. Baseline heparin level is undetectable implying Xarelto is cleared and heparin levels can be used for monitoring heparin going forward.  Heparin level this morning is therapeutic at 0.35 on 1800 units/hr but  at low end of normal. INR today is unchanged at 1.41 after Coumadin 7.5mg  po x1 yesterday. Noted to be on Levaquin which can increase the INR. H/H is down from admission at 9.7/27.8. Platelets are stable. No bleeding or issues reported.   Goal of Therapy:  INR 2-3 Heparin level 0.3-0.7 units/ml Monitor platelets by anticoagulation protocol: Yes    Plan:  Increase heparin to 1900 units/hr. Recheck heparin level in 8 hours to ensure maintains therapeutic level.  Daily heparin level and CBC while on therapy.  Coumadin 7.5 mg po x1 again tonight.  Follow-up PT/INR in AM.  Monitor drug-drug interaction with Levaquin. Monitor for signs and symptoms of bleeding.    Sloan Leiter, PharmD, BCPS Clinical Pharmacist [phone] 10/12/2016 9:00 AM

## 2016-10-12 NOTE — Progress Notes (Signed)
[place] for Heparin Indication: pulmonary embolus  Allergies  Allergen Reactions  . Keflex [Cephalexin] Itching and Swelling    Lips and eyes swelled up  . Clindamycin/Lincomycin Itching  . Latex Itching  . Sulfa Antibiotics Rash    Patient Measurements: Height = 69 inches Weight = 94 kg IBW = 66.2 kg Heparin Dosing Weight: 86 kg  Vital Signs: Temp: 97.8 F (36.6 C) (10/23 1630) Temp Source: Oral (10/23 1630) BP: 117/82 (10/23 1800) Pulse Rate: 92 (10/23 1800)  Labs:  Recent Labs  10/10/16 0005 10/11/16 0520  10/11/16 1806 10/11/16 2226 10/12/16 0400 10/12/16 0730 10/12/16 1316 10/12/16 1800  HGB 13.1  --   --   --   --  9.7*  --   --   --   HCT 37.0  --   --   --   --  27.8*  --   --   --   PLT 397  --   --   --   --  274  --   --   --   APTT  --  42*  --   --   --   --   --   --   --   LABPROT  --  17.5*  --   --   --  17.4*  --   --   --   INR  --  1.42  --   --   --  1.41  --   --   --   HEPARINUNFRC  --  <0.10*  < >  --  0.20*  --  0.35  --  0.39  CREATININE 3.85*  --   < > 3.99*  --  3.97*  --  3.98*  --   TROPONINI  --   --   < > 0.08* 0.62* 0.23*  --   --   --   < > = values in this interval not displayed.  Estimated Creatinine Clearance: 17 mL/min (by C-G formula based on SCr of 3.98 mg/dL (H)).   Medical History: Past Medical History:  Diagnosis Date  . Arthritis    "knees" (07/26/2014)  . Cataract of both eyes   . Chronic back pain   . Daily headache   . Depression   . High cholesterol   . Hypertension   . Hypertensive emergency 12/03/2015  . Kidney stones   . Migraine    "last one was in the 1990's" (07/26/2014)  . Neuropathy (HCC)    feet  . Pneumonia    "several times" (07/26/2014)  . Pulmonary embolism ([place])    2011 treated at [facility] in [place].  Was on Coumadin for over a year.  no known family history  . 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 Type II diabetes mellitus ([place]) dx'd  2000  . UTI (urinary tract infection)    Medications:  See med rec - was on Xarelto PTA but hx of noncompliance.  Last dose 10/20 at 1800.  Assessment: 66 yo F presented to ED after syncopal event and pneumonia with history of PE reportedly noncompliant with Xarelto prior to admission now to start IV Heparin and conversion to Coumadin per pharmacy consult.    - Heparin level therapeutic twice (0.35 and 0.39) - INR today is unchanged at 1.41 after Coumadin 7.5mg  po x1 yesterday. Noted to be on Levaquin which can increase the INR.  - H/H is down from admission  at 9.7/27.8. Platelets are stable. No bleeding or issues reported.   Goal of Therapy:  INR 2-3 Heparin level 0.3-0.7 units/ml Monitor platelets by anticoagulation protocol: Yes    Plan:  Continue heparin 1900 units/hr. Daily heparin level and CBC while on therapy.  Coumadin 7.5 mg po x1 again tonight.  Follow-up PT/INR in AM.  Monitor drug-drug interaction with Levaquin. Monitor for signs and symptoms of bleeding.    Georga Bora, PharmD Clinical Pharmacist Pager: [phone] 10/12/2016 7:10 PM

## 2016-10-13 ENCOUNTER — Inpatient Hospital Stay (HOSPITAL_COMMUNITY): Payer: Medicare Other

## 2016-10-13 DIAGNOSIS — J9621 Acute and chronic respiratory failure with hypoxia: Secondary | ICD-10-CM

## 2016-10-13 LAB — GLUCOSE, CAPILLARY
GLUCOSE-CAPILLARY: 108 mg/dL — AB (ref 65–99)
GLUCOSE-CAPILLARY: 143 mg/dL — AB (ref 65–99)
GLUCOSE-CAPILLARY: 96 mg/dL (ref 65–99)
Glucose-Capillary: 115 mg/dL — ABNORMAL HIGH (ref 65–99)
Glucose-Capillary: 114 mg/dL — ABNORMAL HIGH (ref 65–99)
Glucose-Capillary: 104 mg/dL — ABNORMAL HIGH (ref 65–99)

## 2016-10-13 LAB — CULTURE, BLOOD (ROUTINE X 2)

## 2016-10-13 LAB — BLOOD GAS, ARTERIAL
Acid-base deficit: 10.6 mmol/L — ABNORMAL HIGH (ref 0.0–2.0)
Bicarbonate: 14.1 mmol/L — ABNORMAL LOW (ref 20.0–28.0)
DRAWN BY: 345601
FIO2: 40
MECHVT: 580 mL
O2 SAT: 95.5 %
PATIENT TEMPERATURE: 98.6
PCO2 ART: 27.2 mmHg — AB (ref 32.0–48.0)
PEEP: 5 cmH2O
PH ART: 7.333 — AB (ref 7.350–7.450)
RATE: 18 resp/min
pO2, Arterial: 89.6 mmHg (ref 83.0–108.0)

## 2016-10-13 LAB — PROCALCITONIN: Procalcitonin: 46.55 ng/mL

## 2016-10-13 LAB — BASIC METABOLIC PANEL
Anion gap: 10 (ref 5–15)
BUN: 63 mg/dL — AB (ref 6–20)
CO2: 16 mmol/L — ABNORMAL LOW (ref 22–32)
Calcium: 7.7 mg/dL — ABNORMAL LOW (ref 8.9–10.3)
Chloride: 117 mmol/L — ABNORMAL HIGH (ref 101–111)
Creatinine, Ser: 3.7 mg/dL — ABNORMAL HIGH (ref 0.44–1.00)
GFR, EST AFRICAN AMERICAN: 14 mL/min — AB (ref >60)
GFR, EST NON AFRICAN AMERICAN: 12 mL/min — AB (ref >60)
Glucose, Bld: 123 mg/dL — ABNORMAL HIGH (ref 65–99)
POTASSIUM: 4 mmol/L (ref 3.5–5.1)
SODIUM: 143 mmol/L (ref 135–145)

## 2016-10-13 LAB — CBC
HEMATOCRIT: 27.2 % — AB (ref 36.0–46.0)
HEMOGLOBIN: 9.5 g/dL — AB (ref 12.0–15.0)
MCH: 27.4 pg (ref 26.0–34.0)
MCHC: 34.9 g/dL (ref 30.0–36.0)
MCV: 78.4 fL (ref 78.0–100.0)
Platelets: 277 10*3/uL (ref 150–400)
RBC: 3.47 MIL/uL — ABNORMAL LOW (ref 3.87–5.11)
RDW: 16.7 % — AB (ref 11.5–15.5)
WBC: 19.8 10*3/uL — ABNORMAL HIGH (ref 4.0–10.5)

## 2016-10-13 LAB — RENAL FUNCTION PANEL
ALBUMIN: 1.1 g/dL — AB (ref 3.5–5.0)
Anion gap: 10 (ref 5–15)
BUN: 58 mg/dL — AB (ref 6–20)
CALCIUM: 7.5 mg/dL — AB (ref 8.9–10.3)
CO2: 16 mmol/L — ABNORMAL LOW (ref 22–32)
CREATININE: 3.92 mg/dL — AB (ref 0.44–1.00)
Chloride: 115 mmol/L — ABNORMAL HIGH (ref 101–111)
GFR calc Af Amer: 13 mL/min — ABNORMAL LOW (ref >60)
GFR, EST NON AFRICAN AMERICAN: 11 mL/min — AB (ref >60)
GLUCOSE: 133 mg/dL — AB (ref 65–99)
PHOSPHORUS: 4.4 mg/dL (ref 2.5–4.6)
Potassium: 3.5 mmol/L (ref 3.5–5.1)
SODIUM: 141 mmol/L (ref 135–145)

## 2016-10-13 LAB — PROTIME-INR
INR: 1.91
Prothrombin Time: 22.2 seconds — ABNORMAL HIGH (ref 11.4–15.2)

## 2016-10-13 LAB — MAGNESIUM: MAGNESIUM: 2.3 mg/dL (ref 1.7–2.4)

## 2016-10-13 LAB — HEPARIN LEVEL (UNFRACTIONATED): HEPARIN UNFRACTIONATED: 0.47 [IU]/mL (ref 0.30–0.70)

## 2016-10-13 MED ORDER — POTASSIUM CHLORIDE 10 MEQ/100ML IV SOLN
10.0000 meq | INTRAVENOUS | Status: AC
  Administered 2016-10-13 (×4): 10 meq via INTRAVENOUS
  Filled 2016-10-13 (×4): qty 100

## 2016-10-13 MED ORDER — WARFARIN SODIUM 4 MG PO TABS
4.0000 mg | ORAL_TABLET | Freq: Once | ORAL | Status: AC
  Administered 2016-10-13: 4 mg via ORAL
  Filled 2016-10-13: qty 1

## 2016-10-13 MED ORDER — AMLODIPINE BESYLATE 10 MG PO TABS
10.0000 mg | ORAL_TABLET | Freq: Every day | ORAL | Status: DC
  Administered 2016-10-13 – 2016-11-01 (×18): 10 mg
  Filled 2016-10-13 (×20): qty 1

## 2016-10-13 MED ORDER — FUROSEMIDE 10 MG/ML IJ SOLN
120.0000 mg | Freq: Two times a day (BID) | INTRAVENOUS | Status: AC
  Administered 2016-10-13 – 2016-10-14 (×2): 120 mg via INTRAVENOUS
  Filled 2016-10-13 (×2): qty 12

## 2016-10-13 MED ORDER — WARFARIN SODIUM 2.5 MG PO TABS: 2.5000 mg | ORAL_TABLET | Freq: Once | ORAL | Status: DC

## 2016-10-13 MED ORDER — WARFARIN SODIUM 5 MG PO TABS: 5.0000 mg | ORAL_TABLET | Freq: Once | ORAL | Status: DC

## 2016-10-13 NOTE — Progress Notes (Signed)
PULMONARY / CRITICAL CARE MEDICINE   Name: Joy Butler MRN: [id number] DOB: 1951/07/03    ADMISSION DATE:  10/10/2016 CONSULTATION DATE:  10/11/16   REFERRING MD:  Dr. Reesa Chew / IMTS   CHIEF COMPLAINT:  Multi-focal PNA / Acute Respiratory Failure   HISTORY OF PRESENT ILLNESS:   [name], occasional cigarette smoking, with PMH of arthritis, cataracts, chronic back pain, migraines, depression, HLD, HTN, splenic infarct, DM II, PE (2011, on xarelto with intermittent compliance) who presented to [facility] on 10/22 with reports of lower extremity pain and shortness of breath. The patient also reported a possible syncopal episode.  She presented to the ER via EMS. Per report, she had a memory of the fall. EMS found the patient to be hypoxic with room air saturations of 80%. She does not wear home oxygen at baseline. She was placed on a nonrebreather with improvement to 90%. The patient reported development of a cough, weakness and "not feeling well" one week prior to presentation. Initial ER evaluation found her to have increased work of breathing on exam with saturations in the 70s on room air. She had a mild elevation of lactic acid at 2.84 and rectal temperature of 100.8. The patient was found to have a chest x-ray concerning for multifocal pneumonia. Initially she was not hypotensive. However, later in the morning the patient developed hypotension and increased work of breathing. ABG evaluation demonstrated worsening acidosis. Initial labs-NA 138, K3.3, creatinine 3.85 (up from 2.46), bun 58, anion gap 14, alkaline phosphatase 123, albumin 1.6, BNP 284, troponin 0.07, WBC 18.7, hemoglobin 13.1, and platelets 397.  She felt nausea prior to intubation and vomited (the patient was able to notify staff, sat upright and secretions cleared via suction). Due to bilateral opacities, hypoxic respiratory failure and developing hypotension the decision was made to intubate the patient for airway protection.  PCCM  called for ICU admission.  SUBJECTIVE:  No acute events overnight On SBT with settings of FiO2 40, PEEP 5, and 10 of pressure support  VITAL SIGNS: BP 128/73 (BP Location: Right Arm)   Pulse 87   Temp 98.7 F (37.1 C)   Resp (!) 21   Ht 5\' 9"  (1.753 m)   Wt 100.4 kg (221 lb 5.5 oz)   SpO2 99%   BMI 32.69 kg/m   HEMODYNAMICS:    VENTILATOR SETTINGS: Vent Mode: CPAP;PSV FiO2 (%):  [40 %] 40 % Set Rate:  [18 bmp-30 bmp] 18 bmp Vt Set:  [580 mL] 580 mL PEEP:  [5 cmH20] 5 cmH20 Pressure Support:  [10 cmH20] 10 cmH20 Plateau Pressure:  [16 cmH20-20 cmH20] 18 cmH20  INTAKE / OUTPUT: I/O last 3 completed shifts: In: 4351.9 [I.V.:2936.9; NG/GT:765; IV Piggyback:650] Out: 1140 [Urine:1140]  PHYSICAL EXAMINATION: General:  Ill appearing female, NAD, intubated Neuro: Eyes closed but opens to voice, nods answers to questions and following commands  HEENT:  MM pink/dry, no jvd, ETT in place Cardiovascular:  s1s2 rrr, no m/r/g Lungs: Mild coarse breath sounds throughout  Abdomen:  Obese/soft, NT, bsx4 active  Musculoskeletal:  No acute deformities, BLE symmetrical  Skin:  Warm/dry, no edema  LABS:  BMET  Recent Labs Lab 10/12/16 0400 10/12/16 1316 10/13/16 0255  NA 143 142 141  K 2.6* 3.1* 3.5  CL 114* 117* 115*  CO2 17* 17* 16*  BUN 53* 55* 58*  CREATININE 3.97* 3.98* 3.92*  GLUCOSE 82 92 133*    Electrolytes  Recent Labs Lab 10/12/16 0400 10/12/16 1316 10/13/16 0255  CALCIUM 6.7* 6.9* 7.5*  MG 1.9  --  2.3  PHOS 4.6  --  4.4   CBC  Recent Labs Lab 10/10/16 0005 10/12/16 0400 10/13/16 0255  WBC 18.7* 20.9* 19.8*  HGB 13.1 9.7* 9.5*  HCT 37.0 27.8* 27.2*  PLT 397 274 277   Coag's  Recent Labs Lab 10/11/16 0520 10/12/16 0400 10/13/16 0256  APTT 42*  --   --   INR 1.42 1.41 1.91   Sepsis Markers  Recent Labs Lab 10/11/16 0526 10/11/16 1208 10/11/16 1429 10/12/16 0400 10/13/16 0255  LATICACIDVEN 2.10* 2.2* 2.2*  --   --    PROCALCITON  --  42.77  --  59.52 46.55   ABG  Recent Labs Lab 10/12/16 0313 10/12/16 1348 10/13/16 0422  PHART 7.434 7.341* 7.333*  PCO2ART 23.8* 30.8* 27.2*  PO2ART 79.6* 91.0 89.6   Liver Enzymes  Recent Labs Lab 10/10/16 0005 10/11/16 1208 10/13/16 0255  AST 25 14*  --   ALT 13* 12*  --   ALKPHOS 123 112  --   BILITOT 2.8* 2.5*  --   ALBUMIN 1.6* 1.5* 1.1*   Cardiac Enzymes  Recent Labs Lab 10/11/16 1806 10/11/16 2226 10/12/16 0400  TROPONINI 0.08* 0.62* 0.23*   Glucose  Recent Labs Lab 10/12/16 1149 10/12/16 1629 10/12/16 2024 10/13/16 0008 10/13/16 0420 10/13/16 0828  GLUCAP 77 81 115* 108* 115* 114*   Imaging Dg Chest Port 1 View  Result Date: 10/13/2016 CLINICAL DATA:  Intubation. EXAM: PORTABLE CHEST 1 VIEW COMPARISON:  10/12/2016 FINDINGS: Endotracheal tube terminates 4.7 cm above carina.Nasogastric tube extends beyond the inferior aspect of the film. Patient rotated left. Cardiomegaly accentuated by AP portable technique. Atherosclerosis in the transverse aorta. No pleural effusion or pneumothorax. Right greater than left airspace opacities with relative sparing of the left apex, similar. IMPRESSION: No significant change since 1 day prior. Right greater than left multifocal airspace disease. Pulmonary edema and/or multifocal pneumonia. Cardiomegaly with aortic atherosclerosis. Electronically Signed   By: Abigail Miyamoto M.D.   On: 10/13/2016 07:52   STUDIES:  ECHO 10/22: LVEF 40-45% with mild LDH and G1DD (worse compared to EF of 50-55% 08/2012) CT Chest 10/22 >> extensive bilateral consolidative changes consistent with multifocal pneumonia, mild cardiomegaly with multivessel coronary disease  CULTURES: Influenza 10/22: negative BCx2 10/22 >> strep pneumo UA 10/22: proteinuria, small hgb, moderate bilirubin, negative nitrites and leukocytes; rbc 0-5 on add-on UC 10/22: < 10,000 colonies Strep pneumo urinary Ag 10/22: positive Respiratory panel  10/22: Negative  ANTIBIOTICS: Vanco 10/22 >>  Levaquin 10/22 >>   SIGNIFICANT EVENTS: 10/22  Admit per IMTS with PNA, failed BiPAP & required intubation  LINES/TUBES: ETT 10/22 >>  PIV x 2 Foley 10/22 >>  DISCUSSION: [name] with PMH of PE (non-compliant with xarelto), HTN, HLD admitted 10/22 per IMTS with multifocal PNA.  Developed worsening respiratory failure requiring intubation in ER.    ASSESSMENT / PLAN:  PULMONARY A: Acute Hypoxic Respiratory Failure - in the setting of multi-focal PNA  Multi-focal PNA - R>L opacities  Hx PE - on Xarelto, non-compliant with anticoagulation  Strep pneumo positive with urine Ag testing P:   PRVC 8 cc/kg  Wean PEEP / FiO2 for sats > 92% Trend CXR - stable Decrease RR to 18 See ID  Heparin gtt per pharmacy for hx PE  Duonebs q6h Guaifenesin PT q6h Active diureses  CARDIOVASCULAR A:  Septic Shock - in setting of multifocal PNA Syncopal Episode - prior to admit  Hx HTN, HLD P:  ICU monitoring of hemodynamics  KVO IVF once TF are started No longer needing pressors ECHO without RV abnormality (hx non-compliance with anticoagulation) Restart home norvasc  RENAL A:   Acute Kidney Injury  - baseline sr cr ~ 2.4 Hypokalemia Pseudo-hypocalcemia - corrects normal (8.7) for hypoalbuminemia P:   Trend BMP / UOP  Replace electrolytes as indicated - 4 runs of K ordered KVO IVF given given hyperchloremia Lasix 120 mg IV q6 hours x3 doses Replace electrolytes as indicated.  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   - Inter-disciplinary family meet or Palliative Care meeting due by:  10/29  The patient is critically ill with multiple organ systems failure and requires high complexity decision making for assessment and support, frequent evaluation and titration of therapies, application of advanced monitoring technologies and extensive interpretation of multiple databases.   Critical Care Time devoted to patient care services described in this note is  35  Minutes. This time reflects time of care of this signee Dr Jennet Maduro. This critical care time does not reflect procedure time, or teaching time or supervisory time of PA/NP/Med student/Med Resident etc but could involve care discussion time.  Rush Farmer, M.D. [facility] Pulmonary/Critical Care Medicine. Pager: [phone]. After hours pager: [phone].

## 2016-10-13 NOTE — Care Management Note (Signed)
Case Management Note  Patient Details  Name: Joy Butler MRN: [id number] Date of Birth: [birth year]  Subjective/Objective:    Pt admitted with with CAP                Action/Plan:  Pt is now intubated   Expected Discharge Date:                  Expected Discharge Plan:     In-House Referral:     Discharge planning Services  CM Consult  Post Acute Care Choice:    Choice offered to:     DME Arranged:    DME Agency:     HH Arranged:    HH Agency:     Status of Service:  In process, will continue to follow  If discussed at Long Length of Stay Meetings, dates discussed:    Additional Comments:  Maryclare Labrador, RN 10/13/2016, 2:36 PM

## 2016-10-13 NOTE — Progress Notes (Signed)
[place] for Heparin Indication: pulmonary embolus  Allergies  Allergen Reactions  . Keflex [Cephalexin] Itching and Swelling    Lips and eyes swelled up  . Clindamycin/Lincomycin Itching  . Latex Itching  . Sulfa Antibiotics Rash    Patient Measurements: Height = 69 inches Weight = 94 kg IBW = 66.2 kg Heparin Dosing Weight: 86 kg  Vital Signs: Temp: 98.1 F (36.7 C) (10/24 0420) Temp Source: Oral (10/24 0420) BP: 128/73 (10/24 0600) Pulse Rate: 87 (10/24 0600)  Labs:  Recent Labs  10/11/16 0520  10/11/16 1806 10/11/16 2226 10/12/16 0400 10/12/16 0730 10/12/16 1316 10/12/16 1800 10/13/16 0255 10/13/16 0256  HGB  --   --   --   --  9.7*  --   --   --  9.5*  --   HCT  --   --   --   --  27.8*  --   --   --  27.2*  --   PLT  --   --   --   --  274  --   --   --  277  --   APTT 42*  --   --   --   --   --   --   --   --   --   LABPROT 17.5*  --   --   --  17.4*  --   --   --   --  22.2*  INR 1.42  --   --   --  1.41  --   --   --   --  1.91  HEPARINUNFRC <0.10*  < >  --  0.20*  --  0.35  --  0.39  --  0.47  CREATININE  --   < > 3.99*  --  3.97*  --  3.98*  --  3.92*  --   TROPONINI  --   < > 0.08* 0.62* 0.23*  --   --   --   --   --   < > = values in this interval not displayed.  Estimated Creatinine Clearance: 17.6 mL/min (by C-G formula based on SCr of 3.92 mg/dL (H)).   Medical History: Past Medical History:  Diagnosis Date  . Arthritis    "knees" (07/26/2014)  . Cataract of both eyes   . Chronic back pain   . Daily headache   . Depression   . High cholesterol   . Hypertension   . Hypertensive emergency 12/03/2015  . Kidney stones   . Migraine    "last one was in the 1990's" (07/26/2014)  . Neuropathy (HCC)    feet  . Pneumonia    "several times" (07/26/2014)  . Pulmonary embolism ([place])    2011 treated at [facility] in [place].  Was on Coumadin for over a year.  no known family history  . 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 Type II diabetes mellitus ([place]) dx'd 2000  . UTI (urinary tract infection)     Medications:  See med rec - was on Xarelto PTA but hx of noncompliance.  Last dose 10/20 at 1800.  Assessment: 65 yo F presented to ED after syncopal event and pneumonia with history of PE, reportedly noncompliant with Xarelto prior to admission now to start IV Heparin with conversion to Coumadin per pharmacy consult. Last Xarelto dose reported 10/20 at 1800. Baseline heparin level is undetectable implying Xarelto is cleared and heparin  levels can be used for monitoring heparin going forward.  Heparin level this morning is therapeutic at 0.47 on 1900 units/hr. INR is subtherapeutic at 1.94 but up significantly from 1.41 after two consecutive days of 7.5mg . Of note, patient is on Levaquin which can increase the INR. Hgb down from admit at 9.5, platelets are stable, no S/Sx bleeding noted.   Goal of Therapy:  INR 2-3 Heparin level 0.3-0.7 units/ml Monitor platelets by anticoagulation protocol: Yes    Plan:  -Continue heparin to 1900 units/hr. -Coumadin 4 mg po x1 -Monitor daily heparin level, CBC, INR, S/Sx bleeding -Watch drug-drug interaction with Levaquin closely  Arrie Senate, PharmD PGY-1 Pharmacy Resident Pager: [phone] 10/13/2016

## 2016-10-13 NOTE — Progress Notes (Signed)
PULMONARY / CRITICAL CARE MEDICINE   Name: Joy Butler MRN: [id number] DOB: [birth year]    ADMISSION DATE:  10/10/2016 CONSULTATION DATE:  10/11/16   REFERRING MD:  Dr. Reesa Chew / IMTS   CHIEF COMPLAINT:  Multi-focal PNA / Acute Respiratory Failure   HISTORY OF PRESENT ILLNESS:   [name], occasional cigarette smoking, with PMH of arthritis, cataracts, chronic back pain, migraines, depression, HLD, HTN, splenic infarct, DM II, PE (2011, on xarelto with intermittent compliance) who presented to [facility] on 10/22 with reports of lower extremity pain and shortness of breath. The patient also reported a possible syncopal episode.  She presented to the ER via EMS. Per report, she had a memory of the fall. EMS found the patient to be hypoxic with room air saturations of 80%. She does not wear home oxygen at baseline. She was placed on a nonrebreather with improvement to 90%. The patient reported development of a cough, weakness and "not feeling well" one week prior to presentation. Initial ER evaluation found her to have increased work of breathing on exam with saturations in the 70s on room air. She had a mild elevation of lactic acid at 2.84 and rectal temperature of 100.8. The patient was found to have a chest x-ray concerning for multifocal pneumonia. Initially she was not hypotensive. However, later in the morning the patient developed hypotension and increased work of breathing. ABG evaluation demonstrated worsening acidosis. Initial labs-NA 138, K3.3, creatinine 3.85 (up from 2.46), bun 58, anion gap 14, alkaline phosphatase 123, albumin 1.6, BNP 284, troponin 0.07, WBC 18.7, hemoglobin 13.1, and platelets 397.  She felt nausea prior to intubation and vomited (the patient was able to notify staff, sat upright and secretions cleared via suction). Due to bilateral opacities, hypoxic respiratory failure and developing hypotension the decision was made to intubate the patient for airway protection.  PCCM  called for ICU admission.  SUBJECTIVE:  No acute events overnight On SBT with settings of FiO2 40, PEEP 5, and 10 of pressure support  VITAL SIGNS: BP 128/73 (BP Location: Right Arm)   Pulse 87   Temp 98.1 F (36.7 C) (Oral)   Resp (!) 21   Ht 5\' 9"  (1.753 m)   Wt 221 lb 5.5 oz (100.4 kg)   SpO2 99%   BMI 32.69 kg/m   HEMODYNAMICS:    VENTILATOR SETTINGS: Vent Mode: CPAP;PSV FiO2 (%):  [40 %] 40 % Set Rate:  [18 bmp-30 bmp] 18 bmp Vt Set:  [580 mL] 580 mL PEEP:  [5 cmH20-8 cmH20] 5 cmH20 Pressure Support:  [10 cmH20] 10 cmH20 Plateau Pressure:  [16 cmH20-25 cmH20] 18 cmH20  INTAKE / OUTPUT: I/O last 3 completed shifts: In: 4207.9 [I.V.:2837.9; NG/GT:720; IV Piggyback:650] Out: 79 [Urine:1140]  PHYSICAL EXAMINATION: General:  Ill appearing female, NAD, intubated Neuro: Eyes closed but opens to voice, nods answers to questions and following commands  HEENT:  MM pink/dry, no jvd, ETT in place Cardiovascular:  s1s2 rrr, no m/r/g Lungs: Mild coarse breath sounds throughout  Abdomen:  Obese/soft, NT, bsx4 active  Musculoskeletal:  No acute deformities, BLE symmetrical  Skin:  Warm/dry, no edema  LABS:  BMET  Recent Labs Lab 10/12/16 0400 10/12/16 1316 10/13/16 0255  NA 143 142 141  K 2.6* 3.1* 3.5  CL 114* 117* 115*  CO2 17* 17* 16*  BUN 53* 55* 58*  CREATININE 3.97* 3.98* 3.92*  GLUCOSE 82 92 133*    Electrolytes  Recent Labs Lab 10/12/16 0400 10/12/16 1316 10/13/16  0255  CALCIUM 6.7* 6.9* 7.5*  MG 1.9  --  2.3  PHOS 4.6  --  4.4   CBC  Recent Labs Lab 10/10/16 0005 10/12/16 0400 10/13/16 0255  WBC 18.7* 20.9* 19.8*  HGB 13.1 9.7* 9.5*  HCT 37.0 27.8* 27.2*  PLT 397 274 277   Coag's  Recent Labs Lab 10/11/16 0520 10/12/16 0400 10/13/16 0256  APTT 42*  --   --   INR 1.42 1.41 1.91   Sepsis Markers  Recent Labs Lab 10/11/16 0526 10/11/16 1208 10/11/16 1429 10/12/16 0400 10/13/16 0255  LATICACIDVEN 2.10* 2.2* 2.2*  --    --   PROCALCITON  --  42.77  --  59.52 46.55   ABG  Recent Labs Lab 10/12/16 0313 10/12/16 1348 10/13/16 0422  PHART 7.434 7.341* 7.333*  PCO2ART 23.8* 30.8* 27.2*  PO2ART 79.6* 91.0 89.6   Liver Enzymes  Recent Labs Lab 10/10/16 0005 10/11/16 1208 10/13/16 0255  AST 25 14*  --   ALT 13* 12*  --   ALKPHOS 123 112  --   BILITOT 2.8* 2.5*  --   ALBUMIN 1.6* 1.5* 1.1*   Cardiac Enzymes  Recent Labs Lab 10/11/16 1806 10/11/16 2226 10/12/16 0400  TROPONINI 0.08* 0.62* 0.23*   Glucose  Recent Labs Lab 10/12/16 0813 10/12/16 1149 10/12/16 1629 10/12/16 2024 10/13/16 0008 10/13/16 0420  GLUCAP 89 77 81 115* 108* 115*   Imaging Dg Chest Port 1 View  Result Date: 10/13/2016 CLINICAL DATA:  Intubation. EXAM: PORTABLE CHEST 1 VIEW COMPARISON:  10/12/2016 FINDINGS: Endotracheal tube terminates 4.7 cm above carina.Nasogastric tube extends beyond the inferior aspect of the film. Patient rotated left. Cardiomegaly accentuated by AP portable technique. Atherosclerosis in the transverse aorta. No pleural effusion or pneumothorax. Right greater than left airspace opacities with relative sparing of the left apex, similar. IMPRESSION: No significant change since 1 day prior. Right greater than left multifocal airspace disease. Pulmonary edema and/or multifocal pneumonia. Cardiomegaly with aortic atherosclerosis. Electronically Signed   By: Abigail Miyamoto M.D.   On: 10/13/2016 07:52   STUDIES:  ECHO 10/22: LVEF 40-45% with mild LDH and G1DD (worse compared to EF of 50-55% 08/2012) CT Chest 10/22 >> extensive bilateral consolidative changes consistent with multifocal pneumonia, mild cardiomegaly with multivessel coronary disease  CULTURES: Influenza 10/22: negative BCx2 10/22 >> strep pneumo UA 10/22: proteinuria, small hgb, moderate bilirubin, negative nitrites and leukocytes; rbc 0-5 on add-on UC 10/22: < 10,000 colonies Strep pneumo urinary Ag 10/22: positive Respiratory  panel 10/22: Negative  ANTIBIOTICS: Vanco 10/22 >>  Levaquin 10/22 >>   SIGNIFICANT EVENTS: 10/22  Admit per IMTS with PNA, failed BiPAP & required intubation  LINES/TUBES: ETT 10/22 >>  PIV x 2 Foley 10/22 >>  DISCUSSION: [name] with PMH of PE (non-compliant with xarelto), HTN, HLD admitted 10/22 per IMTS with multifocal PNA.  Developed worsening respiratory failure requiring intubation in ER.    ASSESSMENT / PLAN:  PULMONARY A: Acute Hypoxic Respiratory Failure - in the setting of multi-focal PNA  Multi-focal PNA - R>L opacities  Hx PE - on Xarelto, non-compliant with anticoagulation  Strep pneumo positive with urine Ag testing P:   PRVC 8 cc/kg  Wean PEEP / FiO2 for sats > 92% Trend CXR - stable Decrease RR to 18 See ID  Heparin gtt per pharmacy for hx PE  Duonebs q6h Guaifenesin PT q6h  CARDIOVASCULAR A:  Septic Shock - in setting of multifocal PNA Syncopal Episode - prior to admit  Hx HTN, HLD P:  ICU monitoring of hemodynamics  KVO IVF once TF are started No longer needing pressors ECHO without RV abnormality (hx non-compliance with anticoagulation) Restart home norvasc  RENAL A:   Acute Kidney Injury  - baseline sr cr ~ 2.4 Hypokalemia Pseudo-hypocalcemia - corrects normal (8.7) for hypoalbuminemia P:   Trend BMP / UOP  Replace electrolytes as indicated - 4 runs of K ordered KVO IVF given given hyperchloremia  GASTROINTESTINAL A:   Protein Calorie Malnutrition  Nausea / Vomiting  P:   NPO  TF per nutrition PRN zofran for nausea / vomiting   HEMATOLOGIC A:   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   - Inter-disciplinary family meet or Palliative Care meeting due by:  10/29  Olene Floss, MD [place], PGY-2  Rush Farmer, M.D. [facility] Pulmonary/Critical Care Medicine. Pager: [phone]. After hours pager: [phone].

## 2016-10-13 NOTE — Progress Notes (Signed)
ET tube advanced 2cm per MD orders.  ETT holder changed.  PT tolerated well with not apparent complications.

## 2016-10-13 NOTE — Progress Notes (Signed)
Interim Progress Note:   Nursing reported patient trying to move ET tube. Soft restraints ordered to help prevent self-extubation.   Smiley Houseman, MD PGY 2 Family Medicine

## 2016-10-14 ENCOUNTER — Inpatient Hospital Stay (HOSPITAL_COMMUNITY): Payer: Medicare Other

## 2016-10-14 LAB — RENAL FUNCTION PANEL
Albumin: 1.1 g/dL — ABNORMAL LOW (ref 3.5–5.0)
Anion gap: 11 (ref 5–15)
BUN: 67 mg/dL — AB (ref 6–20)
CHLORIDE: 114 mmol/L — AB (ref 101–111)
CO2: 18 mmol/L — AB (ref 22–32)
CREATININE: 3.51 mg/dL — AB (ref 0.44–1.00)
Calcium: 7.8 mg/dL — ABNORMAL LOW (ref 8.9–10.3)
GFR calc non Af Amer: 12 mL/min — ABNORMAL LOW (ref >60)
GFR, EST AFRICAN AMERICAN: 14 mL/min — AB (ref >60)
Glucose, Bld: 119 mg/dL — ABNORMAL HIGH (ref 65–99)
Phosphorus: 3.6 mg/dL (ref 2.5–4.6)
Potassium: 3.6 mmol/L (ref 3.5–5.1)
Sodium: 143 mmol/L (ref 135–145)

## 2016-10-14 LAB — GLUCOSE, CAPILLARY
GLUCOSE-CAPILLARY: 113 mg/dL — AB (ref 65–99)
GLUCOSE-CAPILLARY: 108 mg/dL — AB (ref 65–99)
Glucose-Capillary: 105 mg/dL — ABNORMAL HIGH (ref 65–99)
Glucose-Capillary: 122 mg/dL — ABNORMAL HIGH (ref 65–99)
Glucose-Capillary: 123 mg/dL — ABNORMAL HIGH (ref 65–99)
Glucose-Capillary: 128 mg/dL — ABNORMAL HIGH (ref 65–99)
Glucose-Capillary: 94 mg/dL (ref 65–99)

## 2016-10-14 LAB — CBC
HEMATOCRIT: 27.1 % — AB (ref 36.0–46.0)
HEMOGLOBIN: 9.2 g/dL — AB (ref 12.0–15.0)
MCH: 27 pg (ref 26.0–34.0)
MCHC: 33.9 g/dL (ref 30.0–36.0)
MCV: 79.5 fL (ref 78.0–100.0)
Platelets: 279 10*3/uL (ref 150–400)
RBC: 3.41 MIL/uL — ABNORMAL LOW (ref 3.87–5.11)
RDW: 17.2 % — ABNORMAL HIGH (ref 11.5–15.5)
WBC: 18.6 10*3/uL — AB (ref 4.0–10.5)

## 2016-10-14 LAB — BLOOD GAS, ARTERIAL
ACID-BASE DEFICIT: 6.7 mmol/L — AB (ref 0.0–2.0)
BICARBONATE: 17.6 mmol/L — AB (ref 20.0–28.0)
Drawn by: 418751
FIO2: 40
LHR: 18 {breaths}/min
MECHVT: 580 mL
O2 SAT: 96.2 %
PATIENT TEMPERATURE: 98.6
PCO2 ART: 31.1 mmHg — AB (ref 32.0–48.0)
PEEP/CPAP: 5 cmH2O
PO2 ART: 87.9 mmHg (ref 83.0–108.0)
pH, Arterial: 7.371 (ref 7.350–7.450)

## 2016-10-14 LAB — PROTIME-INR
INR: 2.34
Prothrombin Time: 26 seconds — ABNORMAL HIGH (ref 11.4–15.2)

## 2016-10-14 LAB — MAGNESIUM: Magnesium: 2.2 mg/dL (ref 1.7–2.4)

## 2016-10-14 LAB — HEPARIN LEVEL (UNFRACTIONATED): Heparin Unfractionated: 0.61 IU/mL (ref 0.30–0.70)

## 2016-10-14 MED ORDER — WARFARIN SODIUM 2.5 MG PO TABS
2.5000 mg | ORAL_TABLET | Freq: Once | ORAL | Status: AC
  Administered 2016-10-14: 2.5 mg via ORAL
  Filled 2016-10-14: qty 1

## 2016-10-14 MED ORDER — POTASSIUM CHLORIDE 20 MEQ/15ML (10%) PO SOLN
40.0000 meq | Freq: Once | ORAL | Status: AC
  Administered 2016-10-14: 40 meq
  Filled 2016-10-14: qty 30

## 2016-10-14 MED ORDER — LEVOFLOXACIN 500 MG PO TABS
500.0000 mg | ORAL_TABLET | ORAL | Status: AC
  Administered 2016-10-15 – 2016-10-19 (×3): 500 mg
  Filled 2016-10-14 (×3): qty 1

## 2016-10-14 MED ORDER — POTASSIUM CHLORIDE 20 MEQ/15ML (10%) PO SOLN
40.0000 meq | Freq: Once | ORAL | Status: AC
Start: 2016-10-14 — End: 2016-10-14
  Administered 2016-10-14: 40 meq
  Filled 2016-10-14: qty 30

## 2016-10-14 MED ORDER — FUROSEMIDE 10 MG/ML IJ SOLN
80.0000 mg | Freq: Four times a day (QID) | INTRAMUSCULAR | Status: AC
  Administered 2016-10-14 – 2016-10-15 (×3): 80 mg via INTRAVENOUS
  Filled 2016-10-14 (×3): qty 8

## 2016-10-14 NOTE — Progress Notes (Signed)
PULMONARY / CRITICAL CARE MEDICINE   Name: Joy Butler MRN: [id number] DOB: April 10, 1951    ADMISSION DATE:  10/10/2016 CONSULTATION DATE:  10/11/16   REFERRING MD:  Dr. Reesa Chew / IMTS   CHIEF COMPLAINT:  Multi-focal PNA / Acute Respiratory Failure   HISTORY OF PRESENT ILLNESS:   [name], occasional cigarette smoking, with PMH of arthritis, cataracts, chronic back pain, migraines, depression, HLD, HTN, splenic infarct, DM II, PE (2011, on xarelto with intermittent compliance) who presented to [facility] on 10/22 with reports of lower extremity pain and shortness of breath. The patient also reported a possible syncopal episode.  She presented to the ER via EMS. Per report, she had a memory of the fall. EMS found the patient to be hypoxic with room air saturations of 80%. She does not wear home oxygen at baseline. She was placed on a nonrebreather with improvement to 90%. The patient reported development of a cough, weakness and "not feeling well" one week prior to presentation. Initial ER evaluation found her to have increased work of breathing on exam with saturations in the 70s on room air. She had a mild elevation of lactic acid at 2.84 and rectal temperature of 100.8. The patient was found to have a chest x-ray concerning for multifocal pneumonia. Initially she was not hypotensive. However, later in the morning the patient developed hypotension and increased work of breathing. ABG evaluation demonstrated worsening acidosis. Initial labs-NA 138, K3.3, creatinine 3.85 (up from 2.46), bun 58, anion gap 14, alkaline phosphatase 123, albumin 1.6, BNP 284, troponin 0.07, WBC 18.7, hemoglobin 13.1, and platelets 397.  She felt nausea prior to intubation and vomited (the patient was able to notify staff, sat upright and secretions cleared via suction). Due to bilateral opacities, hypoxic respiratory failure and developing hypotension the decision was made to intubate the patient for airway protection.  PCCM  called for ICU admission.  SUBJECTIVE:  No acute events overnight On SBT with settings of FiO2 40, PEEP 5. P/F 220.  VITAL SIGNS: BP (!) 157/75   Pulse 83   Temp 98.8 F (37.1 C) (Oral)   Resp 19   Ht 5\' 9"  (1.753 m)   Wt 219 lb 2.2 oz (99.4 kg)   SpO2 100%   BMI 32.36 kg/m   HEMODYNAMICS:    VENTILATOR SETTINGS: Vent Mode: PRVC FiO2 (%):  [40 %] 40 % Set Rate:  [18 bmp] 18 bmp Vt Set:  [580 mL] 580 mL PEEP:  [5 cmH20] 5 cmH20 Pressure Support:  [10 cmH20] 10 cmH20 Plateau Pressure:  [20 cmH20-21 cmH20] 20 cmH20  INTAKE / OUTPUT: I/O last 3 completed shifts: In: 4106.4 [I.V.:1809.4; NG/GT:1335; IV Piggyback:962] Out: 1890 [Urine:1890]  PHYSICAL EXAMINATION: General:  Ill appearing female, NAD, intubated Neuro: Eyes closed but opens to voice, nods answers to questions and following commands  HEENT:  MM pink/dry, no jvd, ETT in place Cardiovascular:  s1s2 rrr, no m/r/g Lungs: CTAB, improved from yesterday when coarse Abdomen:  Obese/soft, NT, bsx4 active  Musculoskeletal:  No acute deformities, BLE symmetrical  Skin:  Warm/dry, no edema  LABS:  BMET  Recent Labs Lab 10/13/16 0255 10/13/16 1850 10/14/16 0448  NA 141 143 143  K 3.5 4.0 3.6  CL 115* 117* 114*  CO2 16* 16* 18*  BUN 58* 63* 67*  CREATININE 3.92* 3.70* 3.51*  GLUCOSE 133* 123* 119*    Electrolytes  Recent Labs Lab 10/12/16 0400  10/13/16 0255 10/13/16 1850 10/14/16 0447 10/14/16 0448  CALCIUM 6.7*  < >  7.5* 7.7*  --  7.8*  MG 1.9  --  2.3  --  2.2  --   PHOS 4.6  --  4.4  --   --  3.6  < > = values in this interval not displayed. CBC  Recent Labs Lab 10/12/16 0400 10/13/16 0255 10/14/16 0447  WBC 20.9* 19.8* 18.6*  HGB 9.7* 9.5* 9.2*  HCT 27.8* 27.2* 27.1*  PLT 274 277 279   Coag's  Recent Labs Lab 10/11/16 0520 10/12/16 0400 10/13/16 0256 10/14/16 0447  APTT 42*  --   --   --   INR 1.42 1.41 1.91 2.34   Sepsis Markers  Recent Labs Lab 10/11/16 0526  10/11/16 1208 10/11/16 1429 10/12/16 0400 10/13/16 0255  LATICACIDVEN 2.10* 2.2* 2.2*  --   --   PROCALCITON  --  42.77  --  59.52 46.55   ABG  Recent Labs Lab 10/12/16 1348 10/13/16 0422 10/14/16 0310  PHART 7.341* 7.333* 7.371  PCO2ART 30.8* 27.2* 31.1*  PO2ART 91.0 89.6 87.9   Liver Enzymes  Recent Labs Lab 10/10/16 0005 10/11/16 1208 10/13/16 0255 10/14/16 0448  AST 25 14*  --   --   ALT 13* 12*  --   --   ALKPHOS 123 112  --   --   BILITOT 2.8* 2.5*  --   --   ALBUMIN 1.6* 1.5* 1.1* 1.1*   Cardiac Enzymes  Recent Labs Lab 10/11/16 1806 10/11/16 2226 10/12/16 0400  TROPONINI 0.08* 0.62* 0.23*   Glucose  Recent Labs Lab 10/13/16 0828 10/13/16 1203 10/13/16 1549 10/13/16 1945 10/14/16 0014 10/14/16 0406  GLUCAP 114* 96 143* 104* 94 105*   Imaging No results found. STUDIES:  ECHO 10/22: LVEF 40-45% with mild LDH and G1DD (worse compared to EF of 50-55% 08/2012) CT Chest 10/22 >> extensive bilateral consolidative changes consistent with multifocal pneumonia, mild cardiomegaly with multivessel coronary disease  CULTURES: Influenza 10/22: negative BCx2 10/22 >> strep pneumo UA 10/22: proteinuria, small hgb, moderate bilirubin, negative nitrites and leukocytes; rbc 0-5 on add-on UC 10/22: < 10,000 colonies Strep pneumo urinary Ag 10/22: positive Respiratory panel 10/22: Negative  ANTIBIOTICS: Vanco 10/22 >>  Levaquin 10/22 >>   SIGNIFICANT EVENTS: 10/22  Admit per IMTS with PNA, failed BiPAP & required intubation  LINES/TUBES: ETT 10/22 >>  PIV x 2 Foley 10/22 >>  DISCUSSION: [name] with PMH of PE (non-compliant with xarelto), HTN, HLD admitted 10/22 per IMTS with multifocal PNA.  Developed worsening respiratory failure requiring intubation in ER. Found to have Strep pneumo bacteremia  ASSESSMENT / PLAN:  PULMONARY A: Acute Hypoxic Respiratory Failure - in the setting of multi-focal PNA  Multi-focal PNA - R>L opacities  Hx PE - on  Xarelto, non-compliant with anticoagulation  Strep pneumo positive with urine Ag testing Net pos 4.4 L P:   PRVC 8 cc/kg  Wean PEEP / FiO2 for sats > 92% Trend CXR - stable Decrease RR to 18 See ID  Heparin gtt per pharmacy for hx PE  Duonebs q6h Guaifenesin PT q6h Continue diureses  CARDIOVASCULAR A:  Septic Shock - in setting of multifocal PNA Syncopal Episode - prior to admit Hx HTN, HLD P:  ICU monitoring of hemodynamics  KVO IVF once TF are started No longer needing pressors ECHO without RV abnormality (hx non-compliance with anticoagulation) Restart home norvasc  RENAL A:   Acute Kidney Injury  - baseline sr cr ~ 2.4 Hypokalemia Pseudo-hypocalcemia - corrects normal (8.7) for hypoalbuminemia P:  Trend BMP / UOP  Replace electrolytes as indicated - 4 runs of K ordered KVO IVF given given hyperchloremia Continue lasix 120 mg IV q6 hours x3 doses Replace electrolytes as indicated.  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   - Inter-disciplinary family meet or Palliative Care meeting due by:  10/29  Olene Floss, MD [place], PGY-2  Rush Farmer, M.D. [facility] Pulmonary/Critical Care Medicine. Pager: [phone]. After hours pager:  [phone].

## 2016-10-14 NOTE — Progress Notes (Signed)
[place] for Heparin/Warfarin Indication: pulmonary embolus  Allergies  Allergen Reactions  . Keflex [Cephalexin] Itching and Swelling    Lips and eyes swelled up  . Clindamycin/Lincomycin Itching  . Latex Itching  . Sulfa Antibiotics Rash    Patient Measurements: Height = 69 inches Weight = 94 kg IBW = 66.2 kg Heparin Dosing Weight: 86 kg  Vital Signs: Temp: 98.8 F (37.1 C) (10/25 0407) Temp Source: Oral (10/25 0407) BP: 157/75 (10/25 0400) Pulse Rate: 83 (10/25 0400)  Labs:  Recent Labs  10/11/16 1806 10/11/16 2226  10/12/16 0400  10/12/16 1800 10/13/16 0255 10/13/16 0256 10/13/16 1850 10/14/16 0447 10/14/16 0448  HGB  --   --   < > 9.7*  --   --  9.5*  --   --  9.2*  --   HCT  --   --   --  27.8*  --   --  27.2*  --   --  27.1*  --   PLT  --   --   --  274  --   --  277  --   --  279  --   LABPROT  --   --   --  17.4*  --   --   --  22.2*  --  26.0*  --   INR  --   --   --  1.41  --   --   --  1.91  --  2.34  --   HEPARINUNFRC  --  0.20*  --   --   < > 0.39  --  0.47  --   --  0.61  CREATININE 3.99*  --   --  3.97*  < >  --  3.92*  --  3.70*  --  3.51*  TROPONINI 0.08* 0.62*  --  0.23*  --   --   --   --   --   --   --   < > = values in this interval not displayed.  Estimated Creatinine Clearance: 19.5 mL/min (by C-G formula based on SCr of 3.51 mg/dL (H)).   Medical History: Past Medical History:  Diagnosis Date  . Arthritis    "knees" (07/26/2014)  . Cataract of both eyes   . Chronic back pain   . Daily headache   . Depression   . High cholesterol   . Hypertension   . Hypertensive emergency 12/03/2015  . Kidney stones   . Migraine    "last one was in the 1990's" (07/26/2014)  . Neuropathy (HCC)    feet  . Pneumonia    "several times" (07/26/2014)  . [place]monary [place]    2011 treated at [facility] in [place].  Was on Coumadin for over a year.  no known family history  . Splenic infarction    On CT  scan 09/2012  . Type II diabetes mellitus ([place]) dx'd 2000  . UTI (urinary tract infection)     Medications:  See med rec - was on Xarelto PTA but hx of noncompliance.  Last dose 10/20 at 1800.  Assessment: 65 yo F presented to ED after syncopal event and pneumonia with history of PE, reportedly noncompliant with Xarelto prior to admission now to start IV Heparin with conversion to Coumadin per pharmacy consult. Last Xarelto dose reported 10/20 at 1800. Baseline heparin level is undetectable implying Xarelto is cleared and heparin levels can be used for monitoring heparin going forward.  Heparin level this morning is therapeutic at 0.61 on 1900 units/hr. INR is therapeutic at 2.34 but up significantly from 1.91 yesterday. Of note, patient is on Levaquin which can increase the INR. Hgb stable at 9.2, platelets normal, no S/Sx bleeding noted.   Goal of Therapy:  INR 2-3 Heparin level 0.3-0.7 units/ml Monitor platelets by anticoagulation protocol: Yes    Plan:  -Continue heparin to 1900 units/hr until INR therapeutic x2 days total -Coumadin 2.5mg  po x1 -Monitor daily heparin level, CBC, INR, S/Sx bleeding -Watch drug-drug interaction with Levaquin closely  Arrie Senate, PharmD PGY-1 Pharmacy Resident Pager: [phone] 10/14/2016

## 2016-10-14 NOTE — Progress Notes (Signed)
PULMONARY / CRITICAL CARE MEDICINE   Name: Joy Butler MRN: [id number] DOB: October 01, 1951    ADMISSION DATE:  10/10/2016 CONSULTATION DATE:  10/11/16   REFERRING MD:  Dr. Reesa Chew / IMTS   CHIEF COMPLAINT:  Multi-focal PNA / Acute Respiratory Failure   HISTORY OF PRESENT ILLNESS:   [name], occasional cigarette smoking, with PMH of arthritis, cataracts, chronic back pain, migraines, depression, HLD, HTN, splenic infarct, DM II, PE (2011, on xarelto with intermittent compliance) who presented to [facility] on 10/22 with reports of lower extremity pain and shortness of breath. The patient also reported a possible syncopal episode.  She presented to the ER via EMS. Per report, she had a memory of the fall. EMS found the patient to be hypoxic with room air saturations of 80%. She does not wear home oxygen at baseline. She was placed on a nonrebreather with improvement to 90%. The patient reported development of a cough, weakness and "not feeling well" one week prior to presentation. Initial ER evaluation found her to have increased work of breathing on exam with saturations in the 70s on room air. She had a mild elevation of lactic acid at 2.84 and rectal temperature of 100.8. The patient was found to have a chest x-ray concerning for multifocal pneumonia. Initially she was not hypotensive. However, later in the morning the patient developed hypotension and increased work of breathing. ABG evaluation demonstrated worsening acidosis. Initial labs-NA 138, K3.3, creatinine 3.85 (up from 2.46), bun 58, anion gap 14, alkaline phosphatase 123, albumin 1.6, BNP 284, troponin 0.07, WBC 18.7, hemoglobin 13.1, and platelets 397.  She felt nausea prior to intubation and vomited (the patient was able to notify staff, sat upright and secretions cleared via suction). Due to bilateral opacities, hypoxic respiratory failure and developing hypotension the decision was made to intubate the patient for airway protection.  PCCM  called for ICU admission.  SUBJECTIVE:  No acute events overnight On SBT with settings of FiO2 40, PEEP 5. P/F 220.  VITAL SIGNS: BP (!) 145/85   Pulse (!) 113   Temp 99.5 F (37.5 C) (Oral)   Resp (!) 27   Ht 5\' 9"  (1.753 m)   Wt 99.4 kg (219 lb 2.2 oz)   SpO2 98%   BMI 32.36 kg/m   HEMODYNAMICS:    VENTILATOR SETTINGS: Vent Mode: PSV;CPAP FiO2 (%):  [40 %] 40 % Set Rate:  [18 bmp] 18 bmp Vt Set:  [554 mL-580 mL] 554 mL PEEP:  [5 cmH20] 5 cmH20 Pressure Support:  [5 cmH20] 5 cmH20 Plateau Pressure:  [20 S192499 cmH20] 21 cmH20  INTAKE / OUTPUT: I/O last 3 completed shifts: In: 3607 [I.V.:1345; NG/GT:1650; IV Piggyback:612] Out: 4005 [Urine:4005]  PHYSICAL EXAMINATION: General:  Ill appearing female, NAD, intubated Neuro: Eyes closed but opens to voice, nods answers to questions and following commands  HEENT:  MM pink/dry, no jvd, ETT in place Cardiovascular:  s1s2 rrr, no m/r/g Lungs: CTAB, improved from yesterday when coarse Abdomen:  Obese/soft, NT, bsx4 active  Musculoskeletal:  No acute deformities, BLE symmetrical  Skin:  Warm/dry, no edema  LABS:  BMET  Recent Labs Lab 10/13/16 0255 10/13/16 1850 10/14/16 0448  NA 141 143 143  K 3.5 4.0 3.6  CL 115* 117* 114*  CO2 16* 16* 18*  BUN 58* 63* 67*  CREATININE 3.92* 3.70* 3.51*  GLUCOSE 133* 123* 119*    Electrolytes  Recent Labs Lab 10/12/16 0400  10/13/16 0255 10/13/16 1850 10/14/16 0447 10/14/16 0448  CALCIUM 6.7*  < > 7.5* 7.7*  --  7.8*  MG 1.9  --  2.3  --  2.2  --   PHOS 4.6  --  4.4  --   --  3.6  < > = values in this interval not displayed. CBC  Recent Labs Lab 10/12/16 0400 10/13/16 0255 10/14/16 0447  WBC 20.9* 19.8* 18.6*  HGB 9.7* 9.5* 9.2*  HCT 27.8* 27.2* 27.1*  PLT 274 277 279   Coag's  Recent Labs Lab 10/11/16 0520 10/12/16 0400 10/13/16 0256 10/14/16 0447  APTT 42*  --   --   --   INR 1.42 1.41 1.91 2.34   Sepsis Markers  Recent Labs Lab  10/11/16 0526 10/11/16 1208 10/11/16 1429 10/12/16 0400 10/13/16 0255  LATICACIDVEN 2.10* 2.2* 2.2*  --   --   PROCALCITON  --  42.77  --  59.52 46.55   ABG  Recent Labs Lab 10/12/16 1348 10/13/16 0422 10/14/16 0310  PHART 7.341* 7.333* 7.371  PCO2ART 30.8* 27.2* 31.1*  PO2ART 91.0 89.6 87.9   Liver Enzymes  Recent Labs Lab 10/10/16 0005 10/11/16 1208 10/13/16 0255 10/14/16 0448  AST 25 14*  --   --   ALT 13* 12*  --   --   ALKPHOS 123 112  --   --   BILITOT 2.8* 2.5*  --   --   ALBUMIN 1.6* 1.5* 1.1* 1.1*   Cardiac Enzymes  Recent Labs Lab 10/11/16 1806 10/11/16 2226 10/12/16 0400  TROPONINI 0.08* 0.62* 0.23*   Glucose  Recent Labs Lab 10/13/16 1549 10/13/16 1945 10/14/16 0014 10/14/16 0406 10/14/16 0800 10/14/16 1144  GLUCAP 143* 104* 94 105* 113* 108*   Imaging Dg Chest Port 1 View  Result Date: 10/14/2016 CLINICAL DATA:  Intubation. EXAM: PORTABLE CHEST 1 VIEW COMPARISON:  10/13/2016. FINDINGS: Endotracheal tube and NG tube in stable position. Cardiomegaly again noted. Diffuse bilateral pulmonary infiltrates and/or edema again noted. No prominent pleural effusion or pneumothorax. Left costophrenic angle not imaged. IMPRESSION: 1. Lines and tubes in stable position. 2. Stable cardiomegaly. 3. Diffuse bilateral pulmonary infiltrates and or edema again noted. No significant change. Electronically Signed   By: Marcello Moores  Register   On: 10/14/2016 07:20   STUDIES:  ECHO 10/22: LVEF 40-45% with mild LDH and G1DD (worse compared to EF of 50-55% 08/2012) CT Chest 10/22 >> extensive bilateral consolidative changes consistent with multifocal pneumonia, mild cardiomegaly with multivessel coronary disease  CULTURES: Influenza 10/22: negative BCx2 10/22 >> strep pneumo UA 10/22: proteinuria, small hgb, moderate bilirubin, negative nitrites and leukocytes; rbc 0-5 on add-on UC 10/22: < 10,000 colonies Strep pneumo urinary Ag 10/22: positive Respiratory panel  10/22: Negative  ANTIBIOTICS: Vanco 10/22 >>  Levaquin 10/22 >>   SIGNIFICANT EVENTS: 10/22  Admit per IMTS with PNA, failed BiPAP & required intubation  LINES/TUBES: ETT 10/22 >>  PIV x 2 Foley 10/22 >>  DISCUSSION: [name] with PMH of PE (non-compliant with xarelto), HTN, HLD admitted 10/22 per IMTS with multifocal PNA.  Developed worsening respiratory failure requiring intubation in ER. Found to have Strep pneumo bacteremia  ASSESSMENT / PLAN:  PULMONARY A: Acute Hypoxic Respiratory Failure - in the setting of multi-focal PNA  Multi-focal PNA - R>L opacities  Hx PE - on Xarelto, non-compliant with anticoagulation  Strep pneumo positive with urine Ag testing Net pos 4.4 L P:   PRVC 8 cc/kg  Wean PEEP / FiO2 for sats > 92% Trend CXR - stable PS trials  but no extubation given increase WOB See ID  Heparin gtt per pharmacy for hx PE  Duonebs q6h Guaifenesin PT q6h Continue diureses  CARDIOVASCULAR A:  Septic Shock - in setting of multifocal PNA Syncopal Episode - prior to admit Hx HTN, HLD P:  ICU monitoring of hemodynamics  KVO IVF once TF are started No longer needing pressors ECHO without RV abnormality (hx non-compliance with anticoagulation) Restart home norvasc  RENAL A:   Acute Kidney Injury  - baseline sr cr ~ 2.4 Hypokalemia Pseudo-hypocalcemia - corrects normal (8.7) for hypoalbuminemia P:   Trend BMP / UOP  Replace electrolytes as indicated - 4 runs of K ordered KVO IVF given given hyperchloremia Continue lasix 80 mg IV q6 hours x3 doses Replace electrolytes as indicated.  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   - Inter-disciplinary family meet or Palliative Care meeting due by:  10/29  The patient is critically ill with multiple organ systems failure and requires high complexity decision making for assessment and support, frequent evaluation and titration of therapies, application of advanced monitoring technologies and extensive interpretation of multiple databases.   Critical Care Time devoted to patient care services described in this note is  35  Minutes. This time reflects time of care of this signee Dr Jennet Maduro. This critical care time does not reflect procedure time, or teaching time or supervisory time of PA/NP/Med student/Med Resident etc but could involve care discussion time.  Rush Farmer, M.D. [facility] Pulmonary/Critical Care Medicine. Pager: [phone]. After hours pager: [phone].

## 2016-10-14 NOTE — Progress Notes (Signed)
Pharmacy Antibiotic Note  Joy Butler is a 65 y.o. female admitted on 10/10/2016 with pneumonia.  Pharmacy has been consulted for levofloxacin dosing. Blood cultures now 2 of 2 positive for strep pneumo sensitive to levaquin, PCN, CTX. Of note, patient has several antibiotic allergies including cephalexin. Currently afebrile, WBC down to 18.6, CrCl ~20 ml/min.  Plan: -Change levofloxacin IV to PO -Continue levofloxacin 500mg  q48hr  Height: 5\' 9"  (175.3 cm) Weight: 219 lb 2.2 oz (99.4 kg) IBW/kg (Calculated) : 66.2  Temp (24hrs), Avg:99.1 F (37.3 C), Min:98.8 F (37.1 C), Max:99.5 F (37.5 C)   Recent Labs Lab 10/10/16 0005 10/11/16 0019 10/11/16 0526 10/11/16 1208 10/11/16 1429  10/12/16 0400 10/12/16 1316 10/13/16 0255 10/13/16 1850 10/14/16 0447 10/14/16 0448  WBC 18.7*  --   --   --   --   --  20.9*  --  19.8*  --  18.6*  --   CREATININE 3.85*  --   --  3.84*  --   < > 3.97* 3.98* 3.92* 3.70*  --  3.51*  LATICACIDVEN  --  2.84* 2.10* 2.2* 2.2*  --   --   --   --   --   --   --   < > = values in this interval not displayed.  Estimated Creatinine Clearance: 19.5 mL/min (by C-G formula based on SCr of 3.51 mg/dL (H)).    Allergies  Allergen Reactions  . Keflex [Cephalexin] Itching and Swelling    Lips and eyes swelled up  . Clindamycin/Lincomycin Itching  . Latex Itching  . Sulfa Antibiotics Rash    Antimicrobials this admission:  Vanc 10/22 >> 10/24 LVQ 10/22 >>  Dose adjustments this admission:  none  Microbiology results:  10/22 BCx: 2/2 Strep Pneumo (Sens: CTX, LVQ, Pen) 10/22 UCx: <10k insiginifcant growth 10/22 Sputum: sent 10/22 MRSA PCR: negative 10/23 Resp PCR: neg   Thank you for allowing pharmacy to be a part of this patient's care.  Arrie Senate, PharmD PGY-1 Pharmacy Resident Pager: [phone] 10/14/2016

## 2016-10-14 NOTE — Progress Notes (Signed)
Patient in Afib with rate of 112 .Hillary Moen informed and dr Nelda Marseille informed.

## 2016-10-15 ENCOUNTER — Inpatient Hospital Stay (HOSPITAL_COMMUNITY): Payer: Medicare Other

## 2016-10-15 DIAGNOSIS — J810 Acute pulmonary edema: Secondary | ICD-10-CM

## 2016-10-15 LAB — CBC
HEMATOCRIT: 27.9 % — AB (ref 36.0–46.0)
HEMOGLOBIN: 9.5 g/dL — AB (ref 12.0–15.0)
MCH: 27.3 pg (ref 26.0–34.0)
MCHC: 34.1 g/dL (ref 30.0–36.0)
MCV: 80.2 fL (ref 78.0–100.0)
PLATELETS: 306 10*3/uL (ref 150–400)
RBC: 3.48 MIL/uL — AB (ref 3.87–5.11)
RDW: 17.3 % — ABNORMAL HIGH (ref 11.5–15.5)
WBC: 21.6 10*3/uL — AB (ref 4.0–10.5)

## 2016-10-15 LAB — BASIC METABOLIC PANEL
ANION GAP: 10 (ref 5–15)
Anion gap: 11 (ref 5–15)
BUN: 83 mg/dL — ABNORMAL HIGH (ref 6–20)
BUN: 95 mg/dL — AB (ref 6–20)
CHLORIDE: 109 mmol/L (ref 101–111)
CO2: 21 mmol/L — AB (ref 22–32)
CO2: 23 mmol/L (ref 22–32)
CREATININE: 3.51 mg/dL — AB (ref 0.44–1.00)
Calcium: 8.3 mg/dL — ABNORMAL LOW (ref 8.9–10.3)
Calcium: 8.5 mg/dL — ABNORMAL LOW (ref 8.9–10.3)
Chloride: 113 mmol/L — ABNORMAL HIGH (ref 101–111)
Creatinine, Ser: 3.44 mg/dL — ABNORMAL HIGH (ref 0.44–1.00)
GFR calc Af Amer: 15 mL/min — ABNORMAL LOW (ref >60)
GFR calc Af Amer: 14 mL/min — ABNORMAL LOW (ref >60)
GFR calc non Af Amer: 12 mL/min — ABNORMAL LOW (ref >60)
GFR, EST NON AFRICAN AMERICAN: 13 mL/min — AB (ref >60)
GLUCOSE: 120 mg/dL — AB (ref 65–99)
GLUCOSE: 113 mg/dL — AB (ref 65–99)
POTASSIUM: 4 mmol/L (ref 3.5–5.1)
POTASSIUM: 4.6 mmol/L (ref 3.5–5.1)
Sodium: 144 mmol/L (ref 135–145)
Sodium: 143 mmol/L (ref 135–145)

## 2016-10-15 LAB — PROTIME-INR
INR: 2.39
Prothrombin Time: 26.5 seconds — ABNORMAL HIGH (ref 11.4–15.2)

## 2016-10-15 LAB — BLOOD GAS, ARTERIAL
Acid-base deficit: 2.5 mmol/L — ABNORMAL HIGH (ref 0.0–2.0)
BICARBONATE: 22 mmol/L (ref 20.0–28.0)
Drawn by: 418751
FIO2: 40
LHR: 24 {breaths}/min
O2 Saturation: 96.4 %
PATIENT TEMPERATURE: 98.6
PCO2 ART: 39.3 mmHg (ref 32.0–48.0)
PEEP: 8 cmH2O
VT: 390 mL
pH, Arterial: 7.367 (ref 7.350–7.450)
pO2, Arterial: 95.3 mmHg (ref 83.0–108.0)

## 2016-10-15 LAB — GLUCOSE, CAPILLARY
GLUCOSE-CAPILLARY: 113 mg/dL — AB (ref 65–99)
GLUCOSE-CAPILLARY: 146 mg/dL — AB (ref 65–99)
GLUCOSE-CAPILLARY: 132 mg/dL — AB (ref 65–99)
GLUCOSE-CAPILLARY: 145 mg/dL — AB (ref 65–99)
Glucose-Capillary: 120 mg/dL — ABNORMAL HIGH (ref 65–99)

## 2016-10-15 LAB — PHOSPHORUS: Phosphorus: 3.4 mg/dL (ref 2.5–4.6)

## 2016-10-15 LAB — MAGNESIUM: Magnesium: 1.9 mg/dL (ref 1.7–2.4)

## 2016-10-15 LAB — HEPARIN LEVEL (UNFRACTIONATED): Heparin Unfractionated: 0.54 IU/mL (ref 0.30–0.70)

## 2016-10-15 MED ORDER — METOLAZONE 5 MG PO TABS
5.0000 mg | ORAL_TABLET | Freq: Every day | ORAL | Status: AC
  Administered 2016-10-15: 5 mg via ORAL
  Filled 2016-10-15: qty 1

## 2016-10-15 MED ORDER — WARFARIN SODIUM 4 MG PO TABS
4.0000 mg | ORAL_TABLET | Freq: Once | ORAL | Status: AC
  Administered 2016-10-15: 4 mg via ORAL
  Filled 2016-10-15: qty 1

## 2016-10-15 MED ORDER — MAGNESIUM SULFATE 2 GM/50ML IV SOLN
2.0000 g | Freq: Once | INTRAVENOUS | Status: AC
  Administered 2016-10-15: 2 g via INTRAVENOUS
  Filled 2016-10-15: qty 50

## 2016-10-15 MED ORDER — POTASSIUM CHLORIDE 20 MEQ/15ML (10%) PO SOLN
40.0000 meq | Freq: Once | ORAL | Status: AC
  Administered 2016-10-15: 40 meq
  Filled 2016-10-15: qty 30

## 2016-10-15 MED ORDER — FUROSEMIDE 10 MG/ML IJ SOLN
100.0000 mg | Freq: Four times a day (QID) | INTRAVENOUS | Status: AC
  Administered 2016-10-15 – 2016-10-16 (×3): 100 mg via INTRAVENOUS
  Filled 2016-10-15 (×3): qty 10

## 2016-10-15 MED ORDER — FUROSEMIDE 10 MG/ML IJ SOLN
80.0000 mg | Freq: Four times a day (QID) | INTRAMUSCULAR | Status: DC
  Administered 2016-10-15: 80 mg via INTRAVENOUS
  Filled 2016-10-15 (×2): qty 8

## 2016-10-15 NOTE — Progress Notes (Signed)
[place] for Heparin/Warfarin Indication: pulmonary embolus  Allergies  Allergen Reactions  . Keflex [Cephalexin] Itching and Swelling    Lips and eyes swelled up  . Clindamycin/Lincomycin Itching  . Latex Itching  . Sulfa Antibiotics Rash    Patient Measurements: Height = 69 inches Weight = 94 kg IBW = 66.2 kg Heparin Dosing Weight: 86 kg  Vital Signs: Temp: 97.8 F (36.6 C) (10/26 0822) Temp Source: Oral (10/26 0822) BP: 158/78 (10/26 1000) Pulse Rate: 95 (10/26 1000)  Labs:  Recent Labs  10/13/16 0255 10/13/16 0256 10/13/16 1850 10/14/16 0447 10/14/16 0448 10/15/16 0358 10/15/16 0359  HGB 9.5*  --   --  9.2*  --  9.5*  --   HCT 27.2*  --   --  27.1*  --  27.9*  --   PLT 277  --   --  279  --  306  --   LABPROT  --  22.2*  --  26.0*  --  26.5*  --   INR  --  1.91  --  2.34  --  2.39  --   HEPARINUNFRC  --  0.47  --   --  0.61  --  0.54  CREATININE 3.92*  --  3.70*  --  3.51* 3.44*  --     Estimated Creatinine Clearance: 19.6 mL/min (by C-G formula based on SCr of 3.44 mg/dL (H)).   Medical History: Past Medical History:  Diagnosis Date  . Arthritis    "knees" (07/26/2014)  . Cataract of both eyes   . Chronic back pain   . Daily headache   . Depression   . High cholesterol   . Hypertension   . Hypertensive emergency 12/03/2015  . Kidney stones   . Migraine    "last one was in the 1990's" (07/26/2014)  . Neuropathy (HCC)    feet  . Pneumonia    "several times" (07/26/2014)  . Pulmonary embolism ([place])    2011 treated at [facility] in [place].  Was on Coumadin for over a year.  no known family history  . 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 Type II diabetes mellitus ([place]) dx'd 2000  . UTI (urinary tract infection)     Medications:  See med rec - was on Xarelto PTA but hx of noncompliance.  Last dose 10/20 at 1800.  Assessment: 65 yo F presented to ED after syncopal event and pneumonia with history  of PE, reportedly noncompliant with Xarelto prior to admission now to start IV Heparin with conversion to Coumadin per pharmacy consult. Last Xarelto dose reported 10/20 at 1800. Baseline heparin level is undetectable implying Xarelto is cleared and heparin levels can be used for monitoring heparin going forward.  Heparin level this morning remains therapeutic at 0.54 on 1900 units/hr. INR is now therapeutic x2 at 2.39. Of note, patient is on Levaquin which can increase the INR. Hgb stable at 9.5, platelets normal, no S/Sx bleeding noted.   Goal of Therapy:  INR 2-3 Heparin level 0.3-0.7 units/ml Monitor platelets by anticoagulation protocol: Yes    Plan:  -Continue heparin at 1900 units/hr until 1700 then turn off to complete 5 total days of bridge -Coumadin 4mg  po x1 -Monitor daily CBC, INR, S/Sx bleeding -Watch drug-drug interaction with Levaquin closely  Arrie Senate, PharmD PGY-1 Pharmacy Resident Pager: [phone] 10/15/2016

## 2016-10-15 NOTE — Progress Notes (Signed)
PULMONARY / CRITICAL CARE MEDICINE   Name: Joy Butler MRN: [id number] DOB: 12-09-1951    ADMISSION DATE:  10/10/2016 CONSULTATION DATE:  10/11/16   REFERRING MD:  Dr. Reesa Chew / IMTS   CHIEF COMPLAINT:  Multi-focal PNA / Acute Respiratory Failure   HISTORY OF PRESENT ILLNESS:   [name], occasional cigarette smoking, with PMH of arthritis, cataracts, chronic back pain, migraines, depression, HLD, HTN, splenic infarct, DM II, PE (2011, on xarelto with intermittent compliance) who presented to [facility] on 10/22 with reports of lower extremity pain and shortness of breath. The patient also reported a possible syncopal episode.  She presented to the ER via EMS. Per report, she had a memory of the fall. EMS found the patient to be hypoxic with room air saturations of 80%. She does not wear home oxygen at baseline. She was placed on a nonrebreather with improvement to 90%. The patient reported development of a cough, weakness and "not feeling well" one week prior to presentation. Initial ER evaluation found her to have increased work of breathing on exam with saturations in the 70s on room air. She had a mild elevation of lactic acid at 2.84 and rectal temperature of 100.8. The patient was found to have a chest x-ray concerning for multifocal pneumonia. Initially she was not hypotensive. However, later in the morning the patient developed hypotension and increased work of breathing. ABG evaluation demonstrated worsening acidosis. Initial labs-NA 138, K3.3, creatinine 3.85 (up from 2.46), bun 58, anion gap 14, alkaline phosphatase 123, albumin 1.6, BNP 284, troponin 0.07, WBC 18.7, hemoglobin 13.1, and platelets 397.  She felt nausea prior to intubation and vomited (the patient was able to notify staff, sat upright and secretions cleared via suction). Due to bilateral opacities, hypoxic respiratory failure and developing hypotension the decision was made to intubate the patient for airway protection.  PCCM  called for ICU admission.  Past Medical History:  Diagnosis Date  . Arthritis    "knees" (07/26/2014)  . Cataract of both eyes   . Chronic back pain   . Daily headache   . Depression   . High cholesterol   . Hypertension   . Hypertensive emergency 12/03/2015  . Kidney stones   . Migraine    "last one was in the 1990's" (07/26/2014)  . Neuropathy (HCC)    feet  . Pneumonia    "several times" (07/26/2014)  . Pulmonary embolism ([place])    2011 treated at [facility] in [place].  Was on Coumadin for over a year.  no known family history  . 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 Type II diabetes mellitus ([place]) dx'd 2000  . UTI (urinary tract infection)     SUBJECTIVE:  No acute events overnight On SBT with settings of FiO2 40, PEEP 5.  VITAL SIGNS: BP (!) 158/78   Pulse 95   Temp 97.8 F (36.6 C) (Oral)   Resp (!) 24   Ht 5\' 9"  (1.753 m)   Wt 96 kg (211 lb 10.3 oz)   SpO2 99%   BMI 31.25 kg/m   HEMODYNAMICS:    VENTILATOR SETTINGS: Vent Mode: PSV;CPAP FiO2 (%):  [40 %] 40 % Set Rate:  [18 bmp] 18 bmp Vt Set:  [580 mL] 580 mL PEEP:  [5 cmH20] 5 cmH20 Pressure Support:  [5 cmH20] 5 cmH20 Plateau Pressure:  [17 cmH20-21 cmH20] 18 cmH20  INTAKE / OUTPUT: I/O last 3 completed shifts: In: 2816 [I.V.:1136; NG/GT:1680] Out:  8350 [Urine:8350]  PHYSICAL EXAMINATION: General:  Ill appearing female, NAD, intubated Neuro: Eyes closed but opens to voice, nods answers to questions and following commands  HEENT:  MM pink/dry, no jvd, ETT in place Cardiovascular:  s1s2 rrr, no m/r/g Lungs: CTAB, improved  Abdomen:  Obese/soft, mildly TTP diffusely, bsx4 active  Musculoskeletal:  No acute deformities, BLE symmetrical  Skin:  Warm/dry, no edema  LABS:  BMET  Recent Labs Lab 10/13/16 1850 10/14/16 0448 10/15/16 0358  NA 143 143 144  K 4.0 3.6 4.0  CL 117* 114* 113*  CO2 16* 18* 21*  BUN 63* 67* 83*  CREATININE 3.70* 3.51* 3.44*  GLUCOSE 123* 119* 120*     Electrolytes  Recent Labs Lab 10/13/16 0255 10/13/16 1850 10/14/16 0447 10/14/16 0448 10/15/16 0358  CALCIUM 7.5* 7.7*  --  7.8* 8.3*  MG 2.3  --  2.2  --  1.9  PHOS 4.4  --   --  3.6 3.4   CBC  Recent Labs Lab 10/13/16 0255 10/14/16 0447 10/15/16 0358  WBC 19.8* 18.6* 21.6*  HGB 9.5* 9.2* 9.5*  HCT 27.2* 27.1* 27.9*  PLT 277 279 306   Coag's  Recent Labs Lab 10/11/16 0520  10/13/16 0256 10/14/16 0447 10/15/16 0358  APTT 42*  --   --   --   --   INR 1.42  < > 1.91 2.34 2.39  < > = values in this interval not displayed. Sepsis Markers  Recent Labs Lab 10/11/16 0526 10/11/16 1208 10/11/16 1429 10/12/16 0400 10/13/16 0255  LATICACIDVEN 2.10* 2.2* 2.2*  --   --   PROCALCITON  --  42.77  --  59.52 46.55   ABG  Recent Labs Lab 10/13/16 0422 10/14/16 0310 10/15/16 0420  PHART 7.333* 7.371 7.367  PCO2ART 27.2* 31.1* 39.3  PO2ART 89.6 87.9 95.3   Liver Enzymes  Recent Labs Lab 10/10/16 0005 10/11/16 1208 10/13/16 0255 10/14/16 0448  AST 25 14*  --   --   ALT 13* 12*  --   --   ALKPHOS 123 112  --   --   BILITOT 2.8* 2.5*  --   --   ALBUMIN 1.6* 1.5* 1.1* 1.1*   Cardiac Enzymes  Recent Labs Lab 10/11/16 1806 10/11/16 2226 10/12/16 0400  TROPONINI 0.08* 0.62* 0.23*   Glucose  Recent Labs Lab 10/14/16 1144 10/14/16 1604 10/14/16 1944 10/14/16 2344 10/15/16 0335 10/15/16 0819  GLUCAP 108* 123* 122* 128* 120* 113*   Imaging Dg Chest Port 1 View  Result Date: 10/15/2016 CLINICAL DATA:  Endotracheal tube. EXAM: PORTABLE CHEST 1 VIEW COMPARISON:  10/14/2016 FINDINGS: Endotracheal tube terminates 2.5-3 cm above the carina. Enteric tube courses into the left upper abdomen with tip not imaged. Cardiac silhouette remains mildly enlarged. Relatively diffuse bilateral pulmonary opacities have not significantly changed. No sizable pleural effusion or pneumothorax is identified. IMPRESSION: Unchanged bilateral pulmonary opacities which  may reflect edema or pneumonia. Electronically Signed   By: Logan Bores M.D.   On: 10/15/2016 07:31   STUDIES:  ECHO 10/22: LVEF 40-45% with mild LDH and G1DD (worse compared to EF of 50-55% 08/2012) CT Chest 10/22 >> extensive bilateral consolidative changes consistent with multifocal pneumonia, mild cardiomegaly with multivessel coronary disease  CULTURES: Influenza 10/22: negative BCx2 10/22 >> strep pneumo UA 10/22: proteinuria, small hgb, moderate bilirubin, negative nitrites and leukocytes; rbc 0-5 on add-on UC 10/22: < 10,000 colonies Strep pneumo urinary Ag 10/22: positive Respiratory panel 10/22: Negative  ANTIBIOTICS: Vanco 10/22 >>  10/24 Levaquin 10/22 >>   SIGNIFICANT EVENTS: 10/22  Admit per IMTS with PNA, failed BiPAP & required intubation  LINES/TUBES: ETT 10/22 >>  PIV x 2 Foley 10/22 >>  DISCUSSION: [name] with PMH of PE (non-compliant with xarelto), HTN, CKD, HLD admitted 10/22 per IMTS with multifocal PNA.  Developed worsening respiratory failure requiring intubation in ER. Found to have Strep pneumo bacteremia  ASSESSMENT / PLAN:  PULMONARY A: Acute Hypoxic Respiratory Failure - in the setting of multi-focal PNA  Multi-focal PNA - R>L opacities  Hx PE - on Xarelto, non-compliant with anticoagulation  Strep pneumo positive with urine Ag testing Net pos 0.9 L P:   PRVC 8 cc/kg  Wean PEEP / FiO2 for sats > 92% Trend CXR - stable See ID  Heparin gtt per pharmacy for hx PE  Duonebs q6h Discontinue guaifenesin in anticipation of possible extubation Continue diureses  CARDIOVASCULAR A:  Septic Shock - in setting of multifocal PNA Syncopal Episode - prior to admit Hx HTN, HLD P:  ICU monitoring of hemodynamics  KVO IVF once TF are started No longer needing pressors ECHO without RV abnormality (hx non-compliance with anticoagulation) Restart home norvasc  RENAL A:   Acute on Chronic Kidney Injury  - baseline sr cr ~  2.4 Hypokalemia Pseudo-hypocalcemia - corrects normal (8.7) for hypoalbuminemia At least CKD IIIB given labs earlier this year P:   Trend BMP / UOP  Replace electrolytes as indicated - 4 runs of K ordered KVO IVF given given hyperchloremia Continue lasix 80 mg IV q6 hours x3 doses Replace electrolytes as indicated. 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   - Inter-disciplinary family meet or Palliative Care meeting due by:  10/29  Olene Floss, MD [place], PGY-2  Rush Farmer, M.D. [facility] Pulmonary/Critical Care Medicine. Pager: [phone]. After hours pager: [phone].

## 2016-10-15 NOTE — Progress Notes (Signed)
PULMONARY / CRITICAL CARE MEDICINE   Name: CHARLEIGH HOLLERAN MRN: [id number] DOB: 04-Jan-1951    ADMISSION DATE:  10/10/2016 CONSULTATION DATE:  10/11/16   REFERRING MD:  Dr. Reesa Chew / IMTS   CHIEF COMPLAINT:  Multi-focal PNA / Acute Respiratory Failure   HISTORY OF PRESENT ILLNESS:   [name], occasional cigarette smoking, with PMH of arthritis, cataracts, chronic back pain, migraines, depression, HLD, HTN, splenic infarct, DM II, PE (2011, on xarelto with intermittent compliance) who presented to [facility] on 10/22 with reports of lower extremity pain and shortness of breath. The patient also reported a possible syncopal episode.  She presented to the ER via EMS. Per report, she had a memory of the fall. EMS found the patient to be hypoxic with room air saturations of 80%. She does not wear home oxygen at baseline. She was placed on a nonrebreather with improvement to 90%. The patient reported development of a cough, weakness and "not feeling well" one week prior to presentation. Initial ER evaluation found her to have increased work of breathing on exam with saturations in the 70s on room air. She had a mild elevation of lactic acid at 2.84 and rectal temperature of 100.8. The patient was found to have a chest x-ray concerning for multifocal pneumonia. Initially she was not hypotensive. However, later in the morning the patient developed hypotension and increased work of breathing. ABG evaluation demonstrated worsening acidosis. Initial labs-NA 138, K3.3, creatinine 3.85 (up from 2.46), bun 58, anion gap 14, alkaline phosphatase 123, albumin 1.6, BNP 284, troponin 0.07, WBC 18.7, hemoglobin 13.1, and platelets 397.  She felt nausea prior to intubation and vomited (the patient was able to notify staff, sat upright and secretions cleared via suction). Due to bilateral opacities, hypoxic respiratory failure and developing hypotension the decision was made to intubate the patient for airway protection.  PCCM  called for ICU admission.  Past Medical History:  Diagnosis Date  . Arthritis    "knees" (07/26/2014)  . Cataract of both eyes   . Chronic back pain   . Daily headache   . Depression   . High cholesterol   . Hypertension   . Hypertensive emergency 12/03/2015  . Kidney stones   . Migraine    "last one was in the 1990's" (07/26/2014)  . Neuropathy (HCC)    feet  . Pneumonia    "several times" (07/26/2014)  . Pulmonary embolism ([place])    2011 treated at [facility] in [place].  Was on Coumadin for over a year.  no known family history  . 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 Type II diabetes mellitus ([place]) dx'd 2000  . UTI (urinary tract infection)     SUBJECTIVE:  No acute events overnight On SBT with settings of FiO2 40, PEEP 5.  VITAL SIGNS: BP (!) 143/81   Pulse 92   Temp 97.8 F (36.6 C) (Oral)   Resp 17   Ht 5\' 9"  (1.753 m)   Wt 211 lb 10.3 oz (96 kg)   SpO2 99%   BMI 31.25 kg/m   HEMODYNAMICS:    VENTILATOR SETTINGS: Vent Mode: PRVC FiO2 (%):  [40 %] 40 % Set Rate:  [18 bmp] 18 bmp Vt Set:  [554 mL-580 mL] 580 mL PEEP:  [5 cmH20] 5 cmH20 Pressure Support:  [5 cmH20] 5 cmH20 Plateau Pressure:  [17 cmH20-21 cmH20] 18 cmH20  INTAKE / OUTPUT: I/O last 3 completed shifts: In: [id number] [I.V.:1167; NG/GT:1755; IV  [id number] Out: [id number] [Urine:7250]  PHYSICAL EXAMINATION: General:  Ill appearing female, NAD, intubated Neuro: Eyes closed but opens to voice, nods answers to questions and following commands  HEENT:  MM pink/dry, no jvd, ETT in place Cardiovascular:  s1s2 rrr, no m/r/g Lungs: CTAB, improved  Abdomen:  Obese/soft, mildly TTP diffusely, bsx4 active  Musculoskeletal:  No acute deformities, BLE symmetrical  Skin:  Warm/dry, no edema  LABS:  BMET  Recent Labs Lab 10/13/16 1850 10/14/16 0448 10/15/16 0358  NA 143 143 144  K 4.0 3.6 4.0  CL 117* 114* 113*  CO2 16* 18* 21*  BUN 63* 67* 83*  CREATININE 3.70* 3.51* 3.44*  GLUCOSE 123*  119* 120*    Electrolytes  Recent Labs Lab 10/13/16 0255 10/13/16 1850 10/14/16 0447 10/14/16 0448 10/15/16 0358  CALCIUM 7.5* 7.7*  --  7.8* 8.3*  MG 2.3  --  2.2  --  1.9  PHOS 4.4  --   --  3.6 3.4   CBC  Recent Labs Lab 10/13/16 0255 10/14/16 0447 10/15/16 0358  WBC 19.8* 18.6* 21.6*  HGB 9.5* 9.2* 9.5*  HCT 27.2* 27.1* 27.9*  PLT 277 279 306   Coag's  Recent Labs Lab 10/11/16 0520  10/13/16 0256 10/14/16 0447 10/15/16 0358  APTT 42*  --   --   --   --   INR 1.42  < > 1.91 2.34 2.39  < > = values in this interval not displayed. Sepsis Markers  Recent Labs Lab 10/11/16 0526 10/11/16 1208 10/11/16 1429 10/12/16 0400 10/13/16 0255  LATICACIDVEN 2.10* 2.2* 2.2*  --   --   PROCALCITON  --  42.77  --  59.52 46.55   ABG  Recent Labs Lab 10/13/16 0422 10/14/16 0310 10/15/16 0420  PHART 7.333* 7.371 7.367  PCO2ART 27.2* 31.1* 39.3  PO2ART 89.6 87.9 95.3   Liver Enzymes  Recent Labs Lab 10/10/16 0005 10/11/16 1208 10/13/16 0255 10/14/16 0448  AST 25 14*  --   --   ALT 13* 12*  --   --   ALKPHOS 123 112  --   --   BILITOT 2.8* 2.5*  --   --   ALBUMIN 1.6* 1.5* 1.1* 1.1*   Cardiac Enzymes  Recent Labs Lab 10/11/16 1806 10/11/16 2226 10/12/16 0400  TROPONINI 0.08* 0.62* 0.23*   Glucose  Recent Labs Lab 10/14/16 0800 10/14/16 1144 10/14/16 1604 10/14/16 1944 10/14/16 2344 10/15/16 0335  GLUCAP 113* 108* 123* 122* 128* 120*   Imaging No results found. STUDIES:  ECHO 10/22: LVEF 40-45% with mild LDH and G1DD (worse compared to EF of 50-55% 08/2012) CT Chest 10/22 >> extensive bilateral consolidative changes consistent with multifocal pneumonia, mild cardiomegaly with multivessel coronary disease  CULTURES: Influenza 10/22: negative BCx2 10/22 >> strep pneumo UA 10/22: proteinuria, small hgb, moderate bilirubin, negative nitrites and leukocytes; rbc 0-5 on add-on UC 10/22: < 10,000 colonies Strep pneumo urinary Ag 10/22:  positive Respiratory panel 10/22: Negative  ANTIBIOTICS: Vanco 10/22 >> 10/24 Levaquin 10/22 >>   SIGNIFICANT EVENTS: 10/22  Admit per IMTS with PNA, failed BiPAP & required intubation  LINES/TUBES: ETT 10/22 >>  PIV x 2 Foley 10/22 >>  DISCUSSION: [name] with PMH of PE (non-compliant with xarelto), HTN, CKD, HLD admitted 10/22 per IMTS with multifocal PNA.  Developed worsening respiratory failure requiring intubation in ER. Found to have Strep pneumo bacteremia  ASSESSMENT / PLAN:  PULMONARY A: Acute Hypoxic Respiratory Failure - in the setting of multi-focal  PNA  Multi-focal PNA - R>L opacities  Hx PE - on Xarelto, non-compliant with anticoagulation  Strep pneumo positive with urine Ag testing Net pos 0.9 L P:   PRVC 8 cc/kg  Wean PEEP / FiO2 for sats > 92% Trend CXR - stable See ID  Heparin gtt per pharmacy for hx PE  Duonebs q6h Discontinue guaifenesin in anticipation of possible extubation Continue diureses  CARDIOVASCULAR A:  Septic Shock - in setting of multifocal PNA Syncopal Episode - prior to admit Hx HTN, HLD P:  ICU monitoring of hemodynamics  KVO IVF once TF are started No longer needing pressors ECHO without RV abnormality (hx non-compliance with anticoagulation) Restart home norvasc  RENAL A:   Acute on Chronic Kidney Injury  - baseline sr cr ~ 2.4 Hypokalemia Pseudo-hypocalcemia - corrects normal (8.7) for hypoalbuminemia At least CKD IIIB given labs earlier this year P:   Trend BMP / UOP  Replace electrolytes as indicated - 4 runs of K ordered KVO IVF given given hyperchloremia Continue lasix 80 mg IV q6 hours x3 doses Replace electrolytes as indicated. 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   - Inter-disciplinary family meet or Palliative Care meeting due by:  10/29  Olene Floss, MD [place], PGY-2  Rush Farmer, M.D. [facility] Pulmonary/Critical Care Medicine. Pager: [phone]. After hours pager: [phone].

## 2016-10-15 NOTE — Care Management Important Message (Signed)
Important Message  Patient Details  Name: Joy Butler MRN: [id number] Date of Birth: 1951/09/13   Medicare Important Message Given:  Yes    [name] May 10/15/2016, 9:19 AM

## 2016-10-15 NOTE — Progress Notes (Signed)
PULMONARY / CRITICAL CARE MEDICINE   Name: Joy Butler MRN: [id number] DOB: [birth year]    ADMISSION DATE:  10/10/2016 CONSULTATION DATE:  10/11/16   REFERRING MD:  Dr. Reesa Chew / IMTS   CHIEF COMPLAINT:  Multi-focal PNA / Acute Respiratory Failure   HISTORY OF PRESENT ILLNESS:   [name], occasional cigarette smoking, with PMH of arthritis, cataracts, chronic back pain, migraines, depression, HLD, HTN, splenic infarct, DM II, PE (2011, on xarelto with intermittent compliance) who presented to [facility] on 10/22 with reports of lower extremity pain and shortness of breath. The patient also reported a possible syncopal episode.  She presented to the ER via EMS. Per report, she had a memory of the fall. EMS found the patient to be hypoxic with room air saturations of 80%. She does not wear home oxygen at baseline. She was placed on a nonrebreather with improvement to 90%. The patient reported development of a cough, weakness and "not feeling well" one week prior to presentation. Initial ER evaluation found her to have increased work of breathing on exam with saturations in the 70s on room air. She had a mild elevation of lactic acid at 2.84 and rectal temperature of 100.8. The patient was found to have a chest x-ray concerning for multifocal pneumonia. Initially she was not hypotensive. However, later in the morning the patient developed hypotension and increased work of breathing. ABG evaluation demonstrated worsening acidosis. Initial labs-NA 138, K3.3, creatinine 3.85 (up from 2.46), bun 58, anion gap 14, alkaline phosphatase 123, albumin 1.6, BNP 284, troponin 0.07, WBC 18.7, hemoglobin 13.1, and platelets 397.  She felt nausea prior to intubation and vomited (the patient was able to notify staff, sat upright and secretions cleared via suction). Due to bilateral opacities, hypoxic respiratory failure and developing hypotension the decision was made to intubate the patient for airway protection.  PCCM  called for ICU admission.  SUBJECTIVE:  No acute events overnight Tolerating 8/5 but respiratory rate remains high. Negative 4 liters over 24 hours.  VITAL SIGNS: BP (!) 158/78   Pulse 95   Temp 97.8 F (36.6 C) (Oral)   Resp (!) 24   Ht 5\' 9"  (1.753 m)   Wt 96 kg (211 lb 10.3 oz)   SpO2 99%   BMI 31.25 kg/m   HEMODYNAMICS:    VENTILATOR SETTINGS: Vent Mode: PSV;CPAP FiO2 (%):  [40 %] 40 % Set Rate:  [18 bmp] 18 bmp Vt Set:  [580 mL] 580 mL PEEP:  [5 cmH20] 5 cmH20 Pressure Support:  [5 cmH20] 5 cmH20 Plateau Pressure:  [17 cmH20-21 cmH20] 18 cmH20  INTAKE / OUTPUT: I/O last 3 completed shifts: In: 2816 [I.V.:1136; NG/GT:1680] Out: 8350 [Urine:8350]  PHYSICAL EXAMINATION: General:  Ill appearing female, NAD, intubated Neuro: Eyes closed but opens to voice, nods answers to questions and following commands  HEENT:  MM pink/dry, no jvd, ETT in place Cardiovascular:  s1s2 rrr, no m/r/g Lungs: CTAB, improved  Abdomen:  Obese/soft, mildly TTP diffusely, bsx4 active  Musculoskeletal:  No acute deformities, BLE symmetrical  Skin:  Warm/dry, no edema  LABS:  BMET  Recent Labs Lab 10/13/16 1850 10/14/16 0448 10/15/16 0358  NA 143 143 144  K 4.0 3.6 4.0  CL 117* 114* 113*  CO2 16* 18* 21*  BUN 63* 67* 83*  CREATININE 3.70* 3.51* 3.44*  GLUCOSE 123* 119* 120*    Electrolytes  Recent Labs Lab 10/13/16 0255 10/13/16 1850 10/14/16 0447 10/14/16 0448 10/15/16 0358  CALCIUM 7.5* 7.7*  --  7.8* 8.3*  MG 2.3  --  2.2  --  1.9  PHOS 4.4  --   --  3.6 3.4   CBC  Recent Labs Lab 10/13/16 0255 10/14/16 0447 10/15/16 0358  WBC 19.8* 18.6* 21.6*  HGB 9.5* 9.2* 9.5*  HCT 27.2* 27.1* 27.9*  PLT 277 279 306   Coag's  Recent Labs Lab 10/11/16 0520  10/13/16 0256 10/14/16 0447 10/15/16 0358  APTT 42*  --   --   --   --   INR 1.42  < > 1.91 2.34 2.39  < > = values in this interval not displayed. Sepsis Markers  Recent Labs Lab 10/11/16 0526  10/11/16 1208 10/11/16 1429 10/12/16 0400 10/13/16 0255  LATICACIDVEN 2.10* 2.2* 2.2*  --   --   PROCALCITON  --  42.77  --  59.52 46.55   ABG  Recent Labs Lab 10/13/16 0422 10/14/16 0310 10/15/16 0420  PHART 7.333* 7.371 7.367  PCO2ART 27.2* 31.1* 39.3  PO2ART 89.6 87.9 95.3   Liver Enzymes  Recent Labs Lab 10/10/16 0005 10/11/16 1208 10/13/16 0255 10/14/16 0448  AST 25 14*  --   --   ALT 13* 12*  --   --   ALKPHOS 123 112  --   --   BILITOT 2.8* 2.5*  --   --   ALBUMIN 1.6* 1.5* 1.1* 1.1*   Cardiac Enzymes  Recent Labs Lab 10/11/16 1806 10/11/16 2226 10/12/16 0400  TROPONINI 0.08* 0.62* 0.23*   Glucose  Recent Labs Lab 10/14/16 1144 10/14/16 1604 10/14/16 1944 10/14/16 2344 10/15/16 0335 10/15/16 0819  GLUCAP 108* 123* 122* 128* 120* 113*   Imaging Dg Chest Port 1 View  Result Date: 10/15/2016 CLINICAL DATA:  Endotracheal tube. EXAM: PORTABLE CHEST 1 VIEW COMPARISON:  10/14/2016 FINDINGS: Endotracheal tube terminates 2.5-3 cm above the carina. Enteric tube courses into the left upper abdomen with tip not imaged. Cardiac silhouette remains mildly enlarged. Relatively diffuse bilateral pulmonary opacities have not significantly changed. No sizable pleural effusion or pneumothorax is identified. IMPRESSION: Unchanged bilateral pulmonary opacities which may reflect edema or pneumonia. Electronically Signed   By: Logan Bores M.D.   On: 10/15/2016 07:31   STUDIES:  ECHO 10/22: LVEF 40-45% with mild LDH and G1DD (worse compared to EF of 50-55% 08/2012) CT Chest 10/22 >> extensive bilateral consolidative changes consistent with multifocal pneumonia, mild cardiomegaly with multivessel coronary disease  CULTURES: Influenza 10/22: negative BCx2 10/22 >> strep pneumo UA 10/22: proteinuria, small hgb, moderate bilirubin, negative nitrites and leukocytes; rbc 0-5 on add-on UC 10/22: < 10,000 colonies Strep pneumo urinary Ag 10/22: positive Respiratory panel  10/22: Negative  ANTIBIOTICS: Vanco 10/22 >> 10/24 Levaquin 10/22 >>   SIGNIFICANT EVENTS: 10/22  Admit per IMTS with PNA, failed BiPAP & required intubation  LINES/TUBES: ETT 10/22 >>  PIV x 2 Foley 10/22 >>  DISCUSSION: [name] with PMH of PE (non-compliant with xarelto), HTN, CKD, HLD admitted 10/22 per IMTS with multifocal PNA.  Developed worsening respiratory failure requiring intubation in ER. Found to have Strep pneumo bacteremia  ASSESSMENT / PLAN:  PULMONARY A: Acute Hypoxic Respiratory Failure - in the setting of multi-focal PNA  Multi-focal PNA - R>L opacities  Hx PE - on Xarelto, non-compliant with anticoagulation  Strep pneumo positive with urine Ag testing Net pos 0.9 L P:   Continue active diureses PRVC 8 cc/kg  Wean PEEP / FiO2 for sats > 92% Trend CXR - stable See ID  Heparin gtt  per pharmacy for hx PE  Duonebs q6h  CARDIOVASCULAR A:  Septic Shock - in setting of multifocal PNA Syncopal Episode - prior to admit Hx HTN, HLD P:  ICU monitoring of hemodynamics  KVO IVF once TF are started D/C pressors ECHO without RV abnormality (hx non-compliance with anticoagulation) Continue home norvasc  RENAL A:   Acute on Chronic Kidney Injury  - baseline sr cr ~ 2.4 Hypokalemia Pseudo-hypocalcemia - corrects normal (8.7) for hypoalbuminemia At least CKD IIIB given labs earlier this year P:   Trend BMP / UOP  KVO IVF given hyperchloremia Continue lasix 100 mg IV q6 hours x3 doses Replace electrolytes as indicated. 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, non-compliant P:  Trend CBC  Heparin gtt per pharmacy  Bridging to warfarin via heparin per pharmacy once  extubated  INFECTIOUS A:   Multifocal PNA  Leukocytosis not resolved --> 19.8 > 18.6 > 21.6 P:   Follow pan cultures  Abx as above, note allergies (keflex - facial swelling, clinda, sulfa) Trend PCT : 42.77 > 59.52 > 46.55 Vanc off 10/24 Day 5 levaquin  ENDOCRINE A:   DM - last hgb A1c 6.2 on 07/06/16   CBGs low 100s P:   SSI   NEUROLOGIC A:   Chronic Back Pain Migraines   P:   RASS goal: 0 to -1  PRN versed/fentanyl for sedation / pain  Serial neuro exams   FAMILY  - Updates: No family at bedside.   - Inter-disciplinary family meet or Palliative Care meeting due by:  10/29  The patient is critically ill with multiple organ systems failure and requires high complexity decision making for assessment and support, frequent evaluation and titration of therapies, application of advanced monitoring technologies and extensive interpretation of multiple databases.   Critical Care Time devoted to patient care services described in this note is  35  Minutes. This time reflects time of care of this signee Dr Jennet Maduro. This critical care time does not reflect procedure time, or teaching time or supervisory time of PA/NP/Med student/Med Resident etc but could involve care discussion time.  Rush Farmer, M.D. [facility] Pulmonary/Critical Care Medicine. Pager: [phone]. After hours pager: [phone].

## 2016-10-16 ENCOUNTER — Inpatient Hospital Stay (HOSPITAL_COMMUNITY): Payer: Medicare Other

## 2016-10-16 DIAGNOSIS — J9601 Acute respiratory failure with hypoxia: Secondary | ICD-10-CM

## 2016-10-16 LAB — BASIC METABOLIC PANEL
Anion gap: 11 (ref 5–15)
BUN: 106 mg/dL — AB (ref 6–20)
CHLORIDE: 108 mmol/L (ref 101–111)
CO2: 24 mmol/L (ref 22–32)
CREATININE: 3.51 mg/dL — AB (ref 0.44–1.00)
Calcium: 8.7 mg/dL — ABNORMAL LOW (ref 8.9–10.3)
GFR calc Af Amer: 14 mL/min — ABNORMAL LOW (ref >60)
GFR calc non Af Amer: 12 mL/min — ABNORMAL LOW (ref >60)
Glucose, Bld: 152 mg/dL — ABNORMAL HIGH (ref 65–99)
Potassium: 3.8 mmol/L (ref 3.5–5.1)
Sodium: 143 mmol/L (ref 135–145)

## 2016-10-16 LAB — CBC
HEMATOCRIT: 30.1 % — AB (ref 36.0–46.0)
HEMOGLOBIN: 9.9 g/dL — AB (ref 12.0–15.0)
MCH: 27.4 pg (ref 26.0–34.0)
MCHC: 32.9 g/dL (ref 30.0–36.0)
MCV: 83.4 fL (ref 78.0–100.0)
Platelets: 355 10*3/uL (ref 150–400)
RBC: 3.61 MIL/uL — ABNORMAL LOW (ref 3.87–5.11)
RDW: 17.5 % — ABNORMAL HIGH (ref 11.5–15.5)
WBC: 24.5 10*3/uL — ABNORMAL HIGH (ref 4.0–10.5)

## 2016-10-16 LAB — BLOOD GAS, ARTERIAL
ACID-BASE DEFICIT: 1.3 mmol/L (ref 0.0–2.0)
BICARBONATE: 22.2 mmol/L (ref 20.0–28.0)
DRAWN BY: 418751
FIO2: 40
LHR: 18 {breaths}/min
MECHVT: 580 mL
O2 SAT: 96.8 %
PEEP/CPAP: 5 cmH2O
PH ART: 7.441 (ref 7.350–7.450)
PO2 ART: 82 mmHg — AB (ref 83.0–108.0)
Patient temperature: 98.6
pCO2 arterial: 33.2 mmHg (ref 32.0–48.0)

## 2016-10-16 LAB — MAGNESIUM: Magnesium: 2.2 mg/dL (ref 1.7–2.4)

## 2016-10-16 LAB — GLUCOSE, CAPILLARY
GLUCOSE-CAPILLARY: 145 mg/dL — AB (ref 65–99)
GLUCOSE-CAPILLARY: 148 mg/dL — AB (ref 65–99)
GLUCOSE-CAPILLARY: 170 mg/dL — AB (ref 65–99)
Glucose-Capillary: 148 mg/dL — ABNORMAL HIGH (ref 65–99)
Glucose-Capillary: 120 mg/dL — ABNORMAL HIGH (ref 65–99)
Glucose-Capillary: 132 mg/dL — ABNORMAL HIGH (ref 65–99)

## 2016-10-16 LAB — PROTIME-INR
INR: 2.17
Prothrombin Time: 24.5 seconds — ABNORMAL HIGH (ref 11.4–15.2)

## 2016-10-16 LAB — PHOSPHORUS: Phosphorus: 3.6 mg/dL (ref 2.5–4.6)

## 2016-10-16 MED ORDER — WARFARIN SODIUM 5 MG PO TABS
5.0000 mg | ORAL_TABLET | Freq: Once | ORAL | Status: AC
  Administered 2016-10-16: 5 mg via ORAL
  Filled 2016-10-16: qty 1

## 2016-10-16 MED ORDER — POLYETHYLENE GLYCOL 3350 17 G PO PACK
17.0000 g | PACK | Freq: Every day | ORAL | Status: DC
  Administered 2016-10-16 – 2016-10-19 (×4): 17 g via ORAL
  Filled 2016-10-16 (×5): qty 1

## 2016-10-16 MED ORDER — METOPROLOL TARTRATE 5 MG/5ML IV SOLN
2.5000 mg | INTRAVENOUS | Status: DC | PRN
  Administered 2016-10-16: 2.5 mg via INTRAVENOUS
  Filled 2016-10-16: qty 5

## 2016-10-16 NOTE — Care Management Note (Signed)
Case Management Note  Patient Details  Name: Joy Butler MRN: [id number] Date of Birth: Jul 11, 1951  Subjective/Objective:    Pt admitted with with CAP                Action/Plan:  Pt is now intubated   Expected Discharge Date:                  Expected Discharge Plan:     In-House Referral:     Discharge planning Services  CM Consult  Post Acute Care Choice:    Choice offered to:     DME Arranged:    DME Agency:     HH Arranged:    HH Agency:     Status of Service:  In process, will continue to follow  If discussed at Long Length of Stay Meetings, dates discussed:    Additional Comments: 10/16/2016 Pt remains intubated-  CM unable to reach family/husband.  CM will continue to follow for discharge needs Maryclare Labrador, RN 10/16/2016, 3:39 PM

## 2016-10-16 NOTE — Progress Notes (Signed)
PULMONARY / CRITICAL CARE MEDICINE   Name: Joy Butler MRN: [id number] DOB: 1951/09/16    ADMISSION DATE:  10/10/2016 CONSULTATION DATE:  10/11/16   REFERRING MD:  Dr. Reesa Chew / IMTS   CHIEF COMPLAINT:  Multi-focal PNA / Acute Respiratory Failure   HISTORY OF PRESENT ILLNESS:   [name], occasional cigarette smoking, with PMH of arthritis, cataracts, chronic back pain, migraines, depression, HLD, HTN, splenic infarct, DM II, PE (2011, on xarelto with intermittent compliance) who presented to [facility] on 10/22 with reports of lower extremity pain and shortness of breath. The patient also reported a possible syncopal episode.  She presented to the ER via EMS. Per report, she had a memory of the fall. EMS found the patient to be hypoxic with room air saturations of 80%. She does not wear home oxygen at baseline. She was placed on a nonrebreather with improvement to 90%. The patient reported development of a cough, weakness and "not feeling well" one week prior to presentation. Initial ER evaluation found her to have increased work of breathing on exam with saturations in the 70s on room air. She had a mild elevation of lactic acid at 2.84 and rectal temperature of 100.8. The patient was found to have a chest x-ray concerning for multifocal pneumonia. Initially she was not hypotensive. However, later in the morning the patient developed hypotension and increased work of breathing. ABG evaluation demonstrated worsening acidosis. Initial labs-NA 138, K3.3, creatinine 3.85 (up from 2.46), bun 58, anion gap 14, alkaline phosphatase 123, albumin 1.6, BNP 284, troponin 0.07, WBC 18.7, hemoglobin 13.1, and platelets 397.  She felt nausea prior to intubation and vomited (the patient was able to notify staff, sat upright and secretions cleared via suction). Due to bilateral opacities, hypoxic respiratory failure and developing hypotension the decision was made to intubate the patient for airway protection.  PCCM  called for ICU admission.  SUBJECTIVE:  No acute events overnight Negative 4 liters in the past 24 hours. Placed on 5/5  VITAL SIGNS: BP (!) 162/61   Pulse 95   Temp 99.8 F (37.7 C) (Oral)   Resp 18   Ht 5\' 9"  (1.753 m)   Wt 91.2 kg (201 lb 1 oz)   SpO2 99%   BMI 29.69 kg/m   HEMODYNAMICS:    VENTILATOR SETTINGS: Vent Mode: PRVC FiO2 (%):  [40 %] 40 % Set Rate:  [18 bmp] 18 bmp Vt Set:  [580 mL] 580 mL PEEP:  [5 cmH20] 5 cmH20 Plateau Pressure:  [18 cmH20-29 cmH20] 18 cmH20  INTAKE / OUTPUT: I/O last 3 completed shifts: In: 2657 [I.V.:722; EX:2596887; IV Piggyback:260] Out: 89 [Urine:7825]  PHYSICAL EXAMINATION: General:  Ill appearing female, NAD, intubated Neuro: Eyes closed but opens to voice, nods answers to questions and following commands  HEENT:  MM pink/dry, no jvd, ETT in place Cardiovascular:  s1s2 rrr, no m/r/g Lungs: CTAB, improved  Abdomen:  Obese/soft, mildly TTP diffusely, bsx4 active  Musculoskeletal:  No acute deformities, BLE symmetrical  Skin:  Warm/dry, no edema  LABS:  BMET  Recent Labs Lab 10/15/16 0358 10/15/16 1800 10/16/16 0400  NA 144 143 143  K 4.0 4.6 3.8  CL 113* 109 108  CO2 21* 23 24  BUN 83* 95* 106*  CREATININE 3.44* 3.51* 3.51*  GLUCOSE 120* 113* 152*    Electrolytes  Recent Labs Lab 10/14/16 0447 10/14/16 0448 10/15/16 0358 10/15/16 1800 10/16/16 0400  CALCIUM  --  7.8* 8.3* 8.5* 8.7*  MG 2.2  --  1.9  --  2.2  PHOS  --  3.6 3.4  --  3.6   CBC  Recent Labs Lab 10/14/16 0447 10/15/16 0358 10/16/16 0400  WBC 18.6* 21.6* 24.5*  HGB 9.2* 9.5* 9.9*  HCT 27.1* 27.9* 30.1*  PLT 279 306 355   Coag's  Recent Labs Lab 10/11/16 0520  10/14/16 0447 10/15/16 0358 10/16/16 0400  APTT 42*  --   --   --   --   INR 1.42  < > 2.34 2.39 2.17  < > = values in this interval not displayed. Sepsis Markers  Recent Labs Lab 10/11/16 0526 10/11/16 1208 10/11/16 1429 10/12/16 0400 10/13/16 0255   LATICACIDVEN 2.10* 2.2* 2.2*  --   --   PROCALCITON  --  42.77  --  59.52 46.55   ABG  Recent Labs Lab 10/14/16 0310 10/15/16 0420 10/16/16 0322  PHART 7.371 7.367 7.441  PCO2ART 31.1* 39.3 33.2  PO2ART 87.9 95.3 82.0*   Liver Enzymes  Recent Labs Lab 10/10/16 0005 10/11/16 1208 10/13/16 0255 10/14/16 0448  AST 25 14*  --   --   ALT 13* 12*  --   --   ALKPHOS 123 112  --   --   BILITOT 2.8* 2.5*  --   --   ALBUMIN 1.6* 1.5* 1.1* 1.1*   Cardiac Enzymes  Recent Labs Lab 10/11/16 1806 10/11/16 2226 10/12/16 0400  TROPONINI 0.08* 0.62* 0.23*   Glucose  Recent Labs Lab 10/15/16 1614 10/15/16 2056 10/16/16 0025 10/16/16 0433 10/16/16 0819 10/16/16 1140  GLUCAP 132* 145* 145* 148* 170* 120*   Imaging Dg Chest Port 1 View  Result Date: 10/16/2016 CLINICAL DATA:  65 year old female with intubation. EXAM: PORTABLE CHEST 1 VIEW COMPARISON:  Chest radiograph dated 10/15/2016 FINDINGS: Endotracheal tube approximately 5 cm above the carina. Enteric tube courses into the left hemi abdomen with tip beyond the inferior margin of the image. Left mid lung field and right lung base pulmonary opacities with no significant interval change. No pleural effusion or pneumothorax. Top-normal cardiac silhouette. No acute osseous pathology. Bilateral shoulder arthritic changes. IMPRESSION: Bilateral pulmonary airspace opacities with no significant interval change. Endotracheal tube above the carina an enteric tube extends into the left hemi abdomen. Electronically Signed   By: Anner Crete M.D.   On: 10/16/2016 05:24   STUDIES:  ECHO 10/22: LVEF 40-45% with mild LDH and G1DD (worse compared to EF of 50-55% 08/2012) CT Chest 10/22 >> extensive bilateral consolidative changes consistent with multifocal pneumonia, mild cardiomegaly with multivessel coronary disease  CULTURES: Influenza 10/22: negative BCx2 10/22 >> strep pneumo UA 10/22: proteinuria, small hgb, moderate bilirubin,  negative nitrites and leukocytes; rbc 0-5 on add-on UC 10/22: < 10,000 colonies Strep pneumo urinary Ag 10/22: positive Respiratory panel 10/22: Negative  ANTIBIOTICS: Vanco 10/22 >> 10/24 Levaquin 10/22 >>   SIGNIFICANT EVENTS: 10/22  Admit per IMTS with PNA, failed BiPAP & required intubation  LINES/TUBES: ETT 10/22 >>  PIV x 2 Foley 10/22 >>  DISCUSSION: [name] with PMH of PE (non-compliant with xarelto), HTN, CKD, HLD admitted 10/22 per IMTS with multifocal PNA.  Developed worsening respiratory failure requiring intubation in ER. Found to have Strep pneumo bacteremia  ASSESSMENT / PLAN:  PULMONARY A: Acute Hypoxic Respiratory Failure - in the setting of multi-focal PNA  Multi-focal PNA - R>L opacities  Hx PE - on Xarelto, non-compliant with anticoagulation  Strep pneumo positive with urine Ag testing Net pos 0.9 L P:  Hold further diureses at this point Place on 5/5, hopefully to extubate today. Wean FiO2 for sats > 92% Trend CXR - stable See ID section Heparin gtt per pharmacy for hx PE  Duonebs q6h  CARDIOVASCULAR A:  Septic Shock - in setting of multifocal PNA Syncopal Episode - prior to admit Hx HTN, HLD P:  ICU monitoring of hemodynamics  KVO IVF once TF are started D/Ced pressors ECHO without RV abnormality (hx non-compliance with anticoagulation) Continue home norvasc  RENAL A:   Acute on Chronic Kidney Injury  - baseline sr cr ~ 2.4 Hypokalemia Pseudo-hypocalcemia - corrects normal (8.7) for hypoalbuminemia At least CKD IIIB given labs earlier this year P:   Trend BMP / UOP  KVO IVF given hyperchloremia Hold further lasix at this point Replace electrolytes as indicated. 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  ENDOCRINE A:   DM - last hgb A1c 6.2 on 07/06/16   CBGs low 100s P:   SSI  CBGs  NEUROLOGIC A:   Chronic Back Pain Migraines   P:   RASS goal: 0 to -1  PRN versed/fentanyl for sedation / pain   FAMILY  - Updates: No family at bedside. Hope is to extubate today.  - Inter-disciplinary family meet or Palliative Care meeting due by:  10/29  The patient is critically ill with multiple organ systems failure and requires high complexity decision making for assessment and support, frequent evaluation and titration of therapies, application of advanced monitoring technologies and extensive interpretation of multiple databases.   Critical Care Time devoted to patient care services described in this note is  35  Minutes. This time reflects time of care of this signee Dr Jennet Maduro. This critical care time does not reflect procedure time, or teaching time or supervisory time of PA/NP/Med student/Med Resident etc but could involve care discussion time.  Rush Farmer, M.D. [facility] Pulmonary/Critical Care Medicine. Pager: [phone]. After hours pager: [phone].

## 2016-10-16 NOTE — Consult Note (Signed)
[place] Nurse wound consult note Reason for Consult: left foot  Patient with history of DM, LE pain. She is currently intubated, no family in the room Wound type: neuropathic foot ulcers x 2 Measurement: 3cm x 3cm x 0; 1.0cm x 1.0cm x 0 Wound bed: both areas are hard, calleoused. Skin is dark but not open, not fluctuant  Drainage (amount, consistency, odor) none Periwound: intact  Dressing procedure/placement/frequency: No topical care needed.  Discussed POC with patient and bedside nurse.  Re consult if needed, will not follow at this time. Thanks  Conna Terada [employer], RN,CWOCN, CNS [phone])

## 2016-10-16 NOTE — Progress Notes (Signed)
eLink Physician-Brief Progress Note Patient Name: ZAHLI SUMMITT DOB: Jan 08, 1951 MRN: [id number]   Date of Service  10/16/2016  HPI/Events of Note  Episodic AF with HR in the 120s.  HD stable.  eICU Interventions  PRN lopressor ordered for HR greater than 115     Intervention Category Intermediate Interventions: Arrhythmia - evaluation and management  Keaisha Sublette 10/16/2016, 5:57 AM

## 2016-10-16 NOTE — Progress Notes (Signed)
[place] for Warfarin Indication: pulmonary embolus  Allergies  Allergen Reactions  . Keflex [Cephalexin] Itching and Swelling    Lips and eyes swelled up  . Clindamycin/Lincomycin Itching  . Latex Itching  . Sulfa Antibiotics Rash    Patient Measurements: Height = 69 inches Weight = 94 kg IBW = 66.2 kg Heparin Dosing Weight: 86 kg  Vital Signs: Temp: 99.1 F (37.3 C) (10/27 0400) Temp Source: Oral (10/27 0400) BP: 125/84 (10/27 0600) Pulse Rate: 129 (10/27 0600)  Labs:  Recent Labs  10/14/16 0447 10/14/16 0448 10/15/16 0358 10/15/16 0359 10/15/16 1800 10/16/16 0400  HGB 9.2*  --  9.5*  --   --  9.9*  HCT 27.1*  --  27.9*  --   --  30.1*  PLT 279  --  306  --   --  355  LABPROT 26.0*  --  26.5*  --   --  24.5*  INR 2.34  --  2.39  --   --  2.17  HEPARINUNFRC  --  0.61  --  0.54  --   --   CREATININE  --  3.51* 3.44*  --  3.51* 3.51*    Estimated Creatinine Clearance: 18.7 mL/min (by C-G formula based on SCr of 3.51 mg/dL (H)).   Medical History: Past Medical History:  Diagnosis Date  . Arthritis    "knees" (07/26/2014)  . Cataract of both eyes   . Chronic back pain   . Daily headache   . Depression   . High cholesterol   . Hypertension   . Hypertensive emergency 12/03/2015  . Kidney stones   . Migraine    "last one was in the 1990's" (07/26/2014)  . Neuropathy (HCC)    feet  . Pneumonia    "several times" (07/26/2014)  . Pulmonary embolism ([place])    2011 treated at [facility] in [place].  Was on Coumadin for over a year.  no known family history  . 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 Type II diabetes mellitus ([place]) dx'd 2000  . UTI (urinary tract infection)     Medications:  See med rec - was on Xarelto PTA but hx of noncompliance.  Last dose 10/20 at 1800.  Assessment: 65 yo F presented to ED after syncopal event and pneumonia with history of PE, reportedly noncompliant with Xarelto prior to  admission now to start IV Heparin with conversion to Coumadin per pharmacy consult. Last Xarelto dose reported 10/20 at 1800. INR therapeutic for 2 days with heparin bridge for 5 days, so heparin gtt now off. INR decreased slightly but remains therapeutic at 2.17. Of note, patient is on Levaquin which can increase the INR. Hgb stable at 9.9, platelets 355, no S/Sx bleeding noted.   Goal of Therapy:   Monitor platelets by anticoagulation protocol: Yes    Plan:  -Coumadin 5mg  po x1 -Monitor daily CBC, INR, S/Sx bleeding -Watch drug-drug interaction with Levaquin closely  Arrie Senate, PharmD PGY-1 Pharmacy Resident Pager: [phone] 10/16/2016

## 2016-10-17 DIAGNOSIS — R6520 Severe sepsis without septic shock: Secondary | ICD-10-CM

## 2016-10-17 LAB — GLUCOSE, CAPILLARY
GLUCOSE-CAPILLARY: 166 mg/dL — AB (ref 65–99)
GLUCOSE-CAPILLARY: 182 mg/dL — AB (ref 65–99)
Glucose-Capillary: 144 mg/dL — ABNORMAL HIGH (ref 65–99)
Glucose-Capillary: 150 mg/dL — ABNORMAL HIGH (ref 65–99)
Glucose-Capillary: 159 mg/dL — ABNORMAL HIGH (ref 65–99)
Glucose-Capillary: 168 mg/dL — ABNORMAL HIGH (ref 65–99)

## 2016-10-17 LAB — CBC
HCT: 27.9 % — ABNORMAL LOW (ref 36.0–46.0)
HEMOGLOBIN: 9.6 g/dL — AB (ref 12.0–15.0)
MCH: 27.6 pg (ref 26.0–34.0)
MCHC: 34.4 g/dL (ref 30.0–36.0)
MCV: 80.2 fL (ref 78.0–100.0)
PLATELETS: 421 10*3/uL — AB (ref 150–400)
RBC: 3.48 MIL/uL — AB (ref 3.87–5.11)
RDW: 17.2 % — ABNORMAL HIGH (ref 11.5–15.5)
WBC: 22.5 10*3/uL — AB (ref 4.0–10.5)

## 2016-10-17 LAB — BASIC METABOLIC PANEL
ANION GAP: 14 (ref 5–15)
BUN: 132 mg/dL — ABNORMAL HIGH (ref 6–20)
CHLORIDE: 106 mmol/L (ref 101–111)
CO2: 25 mmol/L (ref 22–32)
Calcium: 8.5 mg/dL — ABNORMAL LOW (ref 8.9–10.3)
Creatinine, Ser: 3.75 mg/dL — ABNORMAL HIGH (ref 0.44–1.00)
GFR, EST AFRICAN AMERICAN: 13 mL/min — AB (ref >60)
GFR, EST NON AFRICAN AMERICAN: 12 mL/min — AB (ref >60)
Glucose, Bld: 175 mg/dL — ABNORMAL HIGH (ref 65–99)
POTASSIUM: 3.3 mmol/L — AB (ref 3.5–5.1)
SODIUM: 145 mmol/L (ref 135–145)

## 2016-10-17 LAB — MAGNESIUM: MAGNESIUM: 2.1 mg/dL (ref 1.7–2.4)

## 2016-10-17 LAB — PROTIME-INR
INR: 2.28
PROTHROMBIN TIME: 25.5 s — AB (ref 11.4–15.2)

## 2016-10-17 LAB — PHOSPHORUS: PHOSPHORUS: 3.9 mg/dL (ref 2.5–4.6)

## 2016-10-17 MED ORDER — DEXMEDETOMIDINE HCL IN NACL 200 MCG/50ML IV SOLN
0.4000 ug/kg/h | INTRAVENOUS | Status: DC
  Administered 2016-10-17 (×3): 0.4 ug/kg/h via INTRAVENOUS
  Administered 2016-10-18: 0.5 ug/kg/h via INTRAVENOUS
  Administered 2016-10-18: 0.6 ug/kg/h via INTRAVENOUS
  Filled 2016-10-17 (×5): qty 50

## 2016-10-17 MED ORDER — WARFARIN SODIUM 5 MG PO TABS
5.0000 mg | ORAL_TABLET | Freq: Once | ORAL | Status: AC
  Administered 2016-10-17: 5 mg via ORAL
  Filled 2016-10-17 (×2): qty 1

## 2016-10-17 MED ORDER — POTASSIUM CHLORIDE 20 MEQ/15ML (10%) PO SOLN
40.0000 meq | Freq: Once | ORAL | Status: AC
  Administered 2016-10-17: 40 meq
  Filled 2016-10-17: qty 30

## 2016-10-17 NOTE — Progress Notes (Signed)
SLP Cancellation Note  Patient Details Name: Joy Butler MRN: [id number] DOB: 12/02/1951   Cancelled treatment:       Reason Eval/Treat Not Completed: Medical issues which prohibited therapy; pt remains intubated   ADAMS,PAT, M.S., CCC-SLP 10/17/2016, 9:49 AM

## 2016-10-17 NOTE — Progress Notes (Signed)
eLink Physician-Brief Progress Note Patient Name: Joy Butler DOB: 1951-04-26 MRN: [id number]   Date of Service  10/17/2016  HPI/Events of Note  Hypokalemia  eICU Interventions  Potassium replaced     Intervention Category Intermediate Interventions: Electrolyte abnormality - evaluation and management  DETERDING,ELIZABETH 10/17/2016, 4:29 AM

## 2016-10-17 NOTE — Procedures (Signed)
Pt failed SBT due to decreased VT 200, inc PS to 12 and pt is tolerating well, will try to wean PS down today as tolerated, RT will monitor

## 2016-10-17 NOTE — Progress Notes (Signed)
PULMONARY / CRITICAL CARE MEDICINE   Name: Joy Butler MRN: [id number] DOB: 1950-12-24    ADMISSION DATE:  10/10/2016 CONSULTATION DATE:  10/11/16   REFERRING MD:  Dr. Reesa Chew / IMTS   CHIEF COMPLAINT:  Multi-focal PNA / Acute Respiratory Failure   HISTORY OF PRESENT ILLNESS:   [name], occasional cigarette smoking, with PMH of arthritis, cataracts, chronic back pain, migraines, depression, HLD, HTN, splenic infarct, DM II, PE (2011, on xarelto with intermittent compliance) who presented to [facility] on 10/22 with reports of lower extremity pain and shortness of breath. The patient also reported a possible syncopal episode.  She presented to the ER via EMS. Per report, she had a memory of the fall. EMS found the patient to be hypoxic with room air saturations of 80%. She does not wear home oxygen at baseline. She was placed on a nonrebreather with improvement to 90%. The patient reported development of a cough, weakness and "not feeling well" one week prior to presentation. Initial ER evaluation found her to have increased work of breathing on exam with saturations in the 70s on room air. She had a mild elevation of lactic acid at 2.84 and rectal temperature of 100.8. The patient was found to have a chest x-ray concerning for multifocal pneumonia. Initially she was not hypotensive. However, later in the morning the patient developed hypotension and increased work of breathing. ABG evaluation demonstrated worsening acidosis. Initial labs-NA 138, K3.3, creatinine 3.85 (up from 2.46), bun 58, anion gap 14, alkaline phosphatase 123, albumin 1.6, BNP 284, troponin 0.07, WBC 18.7, hemoglobin 13.1, and platelets 397.  She felt nausea prior to intubation and vomited (the patient was able to notify staff, sat upright and secretions cleared via suction). Due to bilateral opacities, hypoxic respiratory failure and developing hypotension the decision was made to intubate the patient for airway protection.  PCCM  called for ICU admission.  SUBJECTIVE:  No events overnight.  VITAL SIGNS: BP (!) 146/58   Pulse 92   Temp 98.7 F (37.1 C) (Oral)   Resp 19   Ht 5\' 9"  (1.753 m)   Wt 87.9 kg (193 lb 12.6 oz)   SpO2 99%   BMI 28.62 kg/m   HEMODYNAMICS:    VENTILATOR SETTINGS: Vent Mode: PSV;CPAP FiO2 (%):  [40 %] 40 % Set Rate:  [18 bmp] 18 bmp Vt Set:  [580 mL] 580 mL PEEP:  [5 cmH20] 5 cmH20 Pressure Support:  [5 cmH20-12 cmH20] 12 cmH20 Plateau Pressure:  [17 cmH20-21 cmH20] 17 cmH20  INTAKE / OUTPUT: I/O last 3 completed shifts: In: 2411 [I.V.:356; NG/GT:1945; IV Piggyback:110] Out: [id number] [Urine:4615]  PHYSICAL EXAMINATION: General:  Ill appearing female, NAD, intubated Neuro: Eyes closed but opens to voice, nods answers to questions and following commands  HEENT:  MM pink/dry, no jvd, ETT in place Cardiovascular:  s1s2 rrr, no m/r/g Lungs: CTAB, improved  Abdomen:  Obese/soft, mildly TTP diffusely, bsx4 active  Musculoskeletal:  No acute deformities, BLE symmetrical  Skin:  Warm/dry, no edema  LABS:  BMET  Recent Labs Lab 10/15/16 1800 10/16/16 0400 10/17/16 0330  NA 143 143 145  K 4.6 3.8 3.3*  CL 109 108 106  CO2 23 24 25   BUN 95* 106* 132*  CREATININE 3.51* 3.51* 3.75*  GLUCOSE 113* 152* 175*    Electrolytes  Recent Labs Lab 10/15/16 0358 10/15/16 1800 10/16/16 0400 10/17/16 0330  CALCIUM 8.3* 8.5* 8.7* 8.5*  MG 1.9  --  2.2 2.1  PHOS 3.4  --  3.6 3.9   CBC  Recent Labs Lab 10/15/16 0358 10/16/16 0400 10/17/16 0330  WBC 21.6* 24.5* 22.5*  HGB 9.5* 9.9* 9.6*  HCT 27.9* 30.1* 27.9*  PLT 306 355 421*   Coag's  Recent Labs Lab 10/11/16 0520  10/15/16 0358 10/16/16 0400 10/17/16 0330  APTT 42*  --   --   --   --   INR 1.42  < > 2.39 2.17 2.28  < > = values in this interval not displayed. Sepsis Markers  Recent Labs Lab 10/11/16 0526 10/11/16 1208 10/11/16 1429 10/12/16 0400 10/13/16 0255  LATICACIDVEN 2.10* 2.2* 2.2*  --   --    PROCALCITON  --  42.77  --  59.52 46.55   ABG  Recent Labs Lab 10/14/16 0310 10/15/16 0420 10/16/16 0322  PHART 7.371 7.367 7.441  PCO2ART 31.1* 39.3 33.2  PO2ART 87.9 95.3 82.0*   Liver Enzymes  Recent Labs Lab 10/11/16 1208 10/13/16 0255 10/14/16 0448  AST 14*  --   --   ALT 12*  --   --   ALKPHOS 112  --   --   BILITOT 2.5*  --   --   ALBUMIN 1.5* 1.1* 1.1*   Cardiac Enzymes  Recent Labs Lab 10/11/16 1806 10/11/16 2226 10/12/16 0400  TROPONINI 0.08* 0.62* 0.23*   Glucose  Recent Labs Lab 10/16/16 0433 10/16/16 0819 10/16/16 1140 10/16/16 1610 10/16/16 2047 10/17/16 0006  GLUCAP 148* 170* 120* 148* 132* 168*   Imaging No results found. STUDIES:  ECHO 10/22: LVEF 40-45% with mild LDH and G1DD (worse compared to EF of 50-55% 08/2012) CT Chest 10/22 >> extensive bilateral consolidative changes consistent with multifocal pneumonia, mild cardiomegaly with multivessel coronary disease  CULTURES: Influenza 10/22: negative BCx2 10/22 >> strep pneumo UA 10/22: proteinuria, small hgb, moderate bilirubin, negative nitrites and leukocytes; rbc 0-5 on add-on UC 10/22: < 10,000 colonies Strep pneumo urinary Ag 10/22: positive Respiratory panel 10/22: Negative  ANTIBIOTICS: Vanco 10/22 >> 10/24 Levaquin 10/22 >>   SIGNIFICANT EVENTS: 10/22  Admit per IMTS with PNA, failed BiPAP & required intubation  LINES/TUBES: ETT 10/22 >>  PIV x 2>> Foley 10/22 >>  DISCUSSION: [name] with PMH of PE (non-compliant with xarelto), HTN, CKD, HLD admitted 10/22 per IMTS with multifocal PNA.  Developed worsening respiratory failure requiring intubation in ER. Found to have Strep pneumo bacteremia  ASSESSMENT / PLAN:  PULMONARY A: Acute Hypoxic Respiratory Failure - in the setting of multi-focal PNA  Multi-focal PNA - R>L opacities  Hx PE - on Xarelto, non-compliant with anticoagulation  Strep pneumo positive with urine Ag testing Net pos 0.9 L P:   Hold  further diureses at this point Begin PS trails but no extubation given increased secretion and worsening renal function. 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 P:   Trend BMP/UOP  KVO IVF given hyperchloremia Hold further lasix at this point  Replace electrolytes as indicated. If UOP drops then will likely need to call renal for ? Of dialysis, no acute indications right now.  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  ENDOCRINE A:   DM - last hgb A1c 6.2 on 07/06/16   CBGs low 100s P:   SSI  CBGs  NEUROLOGIC A:   Chronic Back Pain Migraines   P:   RASS goal: 0 to -1  PRN versed/fentanyl for sedation / pain  Add precedex  FAMILY  - Updates: No family at bedside.   - Inter-disciplinary family  meet or Palliative Care meeting due by:  10/29  The patient is critically ill with multiple organ systems failure and requires high complexity decision making for assessment and support, frequent evaluation and titration of therapies, application of advanced monitoring technologies and extensive interpretation of multiple databases.   Critical Care Time devoted to patient care services described in this note is  35  Minutes. This time reflects time of care of this signee Dr Jennet Maduro. This critical care time does not reflect procedure time, or teaching time or supervisory time of PA/NP/Med student/Med Resident etc but could involve care discussion time.  Rush Farmer, M.D. [facility] Pulmonary/Critical Care Medicine. Pager: [phone]. After hours pager: [phone].

## 2016-10-17 NOTE — Progress Notes (Signed)
[place] for Warfarin Indication: pulmonary embolus  Allergies  Allergen Reactions  . Keflex [Cephalexin] Itching and Swelling    Lips and eyes swelled up  . Clindamycin/Lincomycin Itching  . Latex Itching  . Sulfa Antibiotics Rash    Patient Measurements: Height = 69 inches Weight = 94 kg IBW = 66.2 kg Heparin Dosing Weight: 86 kg  Vital Signs: Temp: 98 F (36.7 C) (10/28 1222) Temp Source: Oral (10/28 1222) BP: 110/70 (10/28 1400) Pulse Rate: 92 (10/28 1400)  Labs:  Recent Labs  10/15/16 0358 10/15/16 0359 10/15/16 1800 10/16/16 0400 10/17/16 0330  HGB 9.5*  --   --  9.9* 9.6*  HCT 27.9*  --   --  30.1* 27.9*  PLT 306  --   --  355 421*  LABPROT 26.5*  --   --  24.5* 25.5*  INR 2.39  --   --  2.17 2.28  HEPARINUNFRC  --  0.54  --   --   --   CREATININE 3.44*  --  3.51* 3.51* 3.75*    Estimated Creatinine Clearance: 17.2 mL/min (by C-G formula based on SCr of 3.75 mg/dL (H)).  Assessment: 65 yo F presented to ED after syncopal event and pneumonia with history of PE, reportedly noncompliant with Xarelto prior to admission now transitioned to warfarin. INR remains therapeutic. No bleeding noted. Of note, patient is on Levaquin which can increase the INR. CBC is stable.   Goal of Therapy:  INR 2-3 Monitor platelets by anticoagulation protocol: Yes    Plan:  -Repeat Coumadin 5mg  po x1 -Monitor daily CBC, INR, S/Sx bleeding -Watch drug-drug interaction with Levaquin closely  Salome Arnt, PharmD, BCPS Pager # [phone] 10/17/2016 2:59 PM

## 2016-10-17 NOTE — Progress Notes (Signed)
Wasted 53mcg of fentanyl with Rosetta Posner RN.

## 2016-10-17 NOTE — Progress Notes (Signed)
Pharmacy Antibiotic Note  Joy Butler is a 65 y.o. female admitted on 10/10/2016 with pneumonia.  Pharmacy has been consulted for levofloxacin dosing. Blood cultures now 2 of 2 positive for strep pneumo sensitive to levaquin, PCN, CTX. Of note, patient has several antibiotic allergies including cephalexin. Currently afebrile but WBC remains elevated at 22.5, SCr up to 3.75.   Plan: Continue levaquin 500mg  PO Q48H F/u renal fxn, C&S, clinical status  Height: 5\' 9"  (175.3 cm) Weight: 193 lb 12.6 oz (87.9 kg) IBW/kg (Calculated) : 66.2  Temp (24hrs), Avg:99 F (37.2 C), Min:98 F (36.7 C), Max:99.6 F (37.6 C)   Recent Labs Lab 10/11/16 0019 10/11/16 0526 10/11/16 1208 10/11/16 1429  10/13/16 0255  10/14/16 0447 10/14/16 0448 10/15/16 0358 10/15/16 1800 10/16/16 0400 10/17/16 0330  WBC  --   --   --   --   < > 19.8*  --  18.6*  --  21.6*  --  24.5* 22.5*  CREATININE  --   --  3.84*  --   < > 3.92*  < >  --  3.51* 3.44* 3.51* 3.51* 3.75*  LATICACIDVEN 2.84* 2.10* 2.2* 2.2*  --   --   --   --   --   --   --   --   --   < > = values in this interval not displayed.  Estimated Creatinine Clearance: 17.2 mL/min (by C-G formula based on SCr of 3.75 mg/dL (H)).    Allergies  Allergen Reactions  . Keflex [Cephalexin] Itching and Swelling    Lips and eyes swelled up  . Clindamycin/Lincomycin Itching  . Latex Itching  . Sulfa Antibiotics Rash    Antimicrobials this admission:  Vanc 10/22 >> 10/24 LVQ 10/22 >>(11/4)  Dose adjustments this admission:  none  Microbiology results:  10/22 BCx: 2/2 Strep Pneumo (Sens: CTX, LVQ, Pen) 10/22 UCx: <10k insiginifcant growth 10/22 Sputum: sent 10/22 MRSA PCR: negative 10/23 Resp PCR: neg   Thank you for allowing pharmacy to be a part of this patient's care.  Salome Arnt, PharmD, BCPS Pager # [phone] 10/17/2016 3:01 PM

## 2016-10-17 NOTE — Progress Notes (Signed)
Upon assessment pt was alert, and slightly confused pulling on tubings, but followed commands. Pt did asked for pain medicine few times.. Failed weaning, RR increased, and it  Difficult to suction. RT was notified. After RT pt seems more calm. I was told that wound nurse came and assessed the skin. No documentation from Wound  RN was found on MASD (open, looks like cuts) under both breasts and on the bottom. On coccyx it seems more healed with pink edges. Under breasts seems in a healing state as well, but more darker pink color. At this time, pt seems comfortable and resting.

## 2016-10-18 ENCOUNTER — Inpatient Hospital Stay (HOSPITAL_COMMUNITY): Payer: Medicare Other

## 2016-10-18 LAB — BASIC METABOLIC PANEL
ANION GAP: 13 (ref 5–15)
BUN: 152 mg/dL — AB (ref 6–20)
CALCIUM: 8.4 mg/dL — AB (ref 8.9–10.3)
CO2: 24 mmol/L (ref 22–32)
CREATININE: 3.84 mg/dL — AB (ref 0.44–1.00)
Chloride: 110 mmol/L (ref 101–111)
GFR calc Af Amer: 13 mL/min — ABNORMAL LOW (ref >60)
GFR, EST NON AFRICAN AMERICAN: 11 mL/min — AB (ref >60)
GLUCOSE: 192 mg/dL — AB (ref 65–99)
Potassium: 3.7 mmol/L (ref 3.5–5.1)
Sodium: 147 mmol/L — ABNORMAL HIGH (ref 135–145)

## 2016-10-18 LAB — CBC
HEMATOCRIT: 27.4 % — AB (ref 36.0–46.0)
Hemoglobin: 9.1 g/dL — ABNORMAL LOW (ref 12.0–15.0)
MCH: 26.8 pg (ref 26.0–34.0)
MCHC: 33.2 g/dL (ref 30.0–36.0)
MCV: 80.8 fL (ref 78.0–100.0)
PLATELETS: 488 10*3/uL — AB (ref 150–400)
RBC: 3.39 MIL/uL — ABNORMAL LOW (ref 3.87–5.11)
RDW: 17.3 % — AB (ref 11.5–15.5)
WBC: 20.4 10*3/uL — AB (ref 4.0–10.5)

## 2016-10-18 LAB — BLOOD GAS, ARTERIAL
ACID-BASE EXCESS: 2.3 mmol/L — AB (ref 0.0–2.0)
BICARBONATE: 25.3 mmol/L (ref 20.0–28.0)
DRAWN BY: 398661
FIO2: 40
LHR: 18 {breaths}/min
MECHVT: 580 mL
O2 Saturation: 95.9 %
PATIENT TEMPERATURE: 98.6
PCO2 ART: 32.2 mmHg (ref 32.0–48.0)
PEEP/CPAP: 5 cmH2O
PO2 ART: 82.7 mmHg — AB (ref 83.0–108.0)
pH, Arterial: 7.507 — ABNORMAL HIGH (ref 7.350–7.450)

## 2016-10-18 LAB — GLUCOSE, CAPILLARY
GLUCOSE-CAPILLARY: 165 mg/dL — AB (ref 65–99)
GLUCOSE-CAPILLARY: 180 mg/dL — AB (ref 65–99)
GLUCOSE-CAPILLARY: 137 mg/dL — AB (ref 65–99)
GLUCOSE-CAPILLARY: 143 mg/dL — AB (ref 65–99)
GLUCOSE-CAPILLARY: 192 mg/dL — AB (ref 65–99)
Glucose-Capillary: 168 mg/dL — ABNORMAL HIGH (ref 65–99)

## 2016-10-18 LAB — MAGNESIUM: Magnesium: 2.1 mg/dL (ref 1.7–2.4)

## 2016-10-18 LAB — PROTIME-INR
INR: 2.44
Prothrombin Time: 27 seconds — ABNORMAL HIGH (ref 11.4–15.2)

## 2016-10-18 LAB — PHOSPHORUS: Phosphorus: 4.6 mg/dL (ref 2.5–4.6)

## 2016-10-18 MED ORDER — FENTANYL 2500MCG IN NS 250ML (10MCG/ML) PREMIX INFUSION
10.0000 ug/h | INTRAVENOUS | Status: DC
  Administered 2016-10-18: 10 ug/h via INTRAVENOUS
  Filled 2016-10-18: qty 250

## 2016-10-18 MED ORDER — MIDAZOLAM HCL 2 MG/2ML IJ SOLN
INTRAMUSCULAR | Status: AC
  Filled 2016-10-18: qty 2

## 2016-10-18 MED ORDER — WARFARIN SODIUM 5 MG PO TABS
5.0000 mg | ORAL_TABLET | Freq: Once | ORAL | Status: AC
  Administered 2016-10-18: 5 mg via ORAL
  Filled 2016-10-18: qty 1

## 2016-10-18 MED ORDER — DEXMEDETOMIDINE HCL IN NACL 400 MCG/100ML IV SOLN
0.4000 ug/kg/h | INTRAVENOUS | Status: DC
  Administered 2016-10-18: 0.4 ug/kg/h via INTRAVENOUS
  Administered 2016-10-18: 0.8 ug/kg/h via INTRAVENOUS
  Administered 2016-10-18: 0.5 ug/kg/h via INTRAVENOUS
  Administered 2016-10-18: 0.6 ug/kg/h via INTRAVENOUS
  Administered 2016-10-19: 1.2 ug/kg/h via INTRAVENOUS
  Administered 2016-10-19: 0.6 ug/kg/h via INTRAVENOUS
  Administered 2016-10-19: 1.2 ug/kg/h via INTRAVENOUS
  Administered 2016-10-19: 0.6 ug/kg/h via INTRAVENOUS
  Administered 2016-10-19 (×4): 1.2 ug/kg/h via INTRAVENOUS
  Administered 2016-10-19: 0.5 ug/kg/h via INTRAVENOUS
  Administered 2016-10-19 – 2016-10-20 (×2): 1.2 ug/kg/h via INTRAVENOUS
  Administered 2016-10-20: 1.6 ug/kg/h via INTRAVENOUS
  Administered 2016-10-20: 1.2 ug/kg/h via INTRAVENOUS
  Administered 2016-10-20: 1.3 ug/kg/h via INTRAVENOUS
  Administered 2016-10-20: 1.8 ug/kg/h via INTRAVENOUS
  Administered 2016-10-20: 1.4 ug/kg/h via INTRAVENOUS
  Administered 2016-10-21: 0.6 ug/kg/h via INTRAVENOUS
  Administered 2016-10-21: 0.4 ug/kg/h via INTRAVENOUS
  Administered 2016-10-21: 1 ug/kg/h via INTRAVENOUS
  Administered 2016-10-22: 0.8 ug/kg/h via INTRAVENOUS
  Administered 2016-10-22: 1 ug/kg/h via INTRAVENOUS
  Filled 2016-10-18 (×2): qty 50
  Filled 2016-10-18: qty 100
  Filled 2016-10-18: qty 50
  Filled 2016-10-18 (×2): qty 100
  Filled 2016-10-18: qty 50
  Filled 2016-10-18 (×3): qty 100
  Filled 2016-10-18: qty 50
  Filled 2016-10-18: qty 100
  Filled 2016-10-18 (×3): qty 50
  Filled 2016-10-18: qty 100
  Filled 2016-10-18: qty 50
  Filled 2016-10-18: qty 100
  Filled 2016-10-18: qty 50
  Filled 2016-10-18 (×2): qty 100
  Filled 2016-10-18 (×2): qty 50
  Filled 2016-10-18 (×2): qty 100

## 2016-10-18 MED ORDER — MIDAZOLAM HCL 2 MG/2ML IJ SOLN
2.0000 mg | Freq: Once | INTRAMUSCULAR | Status: AC
  Administered 2016-10-18: 2 mg via INTRAVENOUS

## 2016-10-18 MED ORDER — ROCURONIUM BROMIDE 50 MG/5ML IV SOLN
1.0000 mg/kg | Freq: Once | INTRAVENOUS | Status: AC
  Administered 2016-10-18: 10 mg via INTRAVENOUS

## 2016-10-18 MED ORDER — FENTANYL CITRATE (PF) 100 MCG/2ML IJ SOLN
100.0000 ug | Freq: Once | INTRAMUSCULAR | Status: AC
  Administered 2016-10-18: 100 ug via INTRAVENOUS

## 2016-10-18 MED ORDER — FENTANYL CITRATE (PF) 100 MCG/2ML IJ SOLN
INTRAMUSCULAR | Status: AC
  Filled 2016-10-18: qty 2

## 2016-10-18 MED ORDER — ETOMIDATE 2 MG/ML IV SOLN
0.3000 mg/kg | Freq: Once | INTRAVENOUS | Status: AC
  Administered 2016-10-18: 20 mg via INTRAVENOUS

## 2016-10-18 MED ORDER — METHYLPREDNISOLONE SODIUM SUCC 125 MG IJ SOLR
60.0000 mg | Freq: Four times a day (QID) | INTRAMUSCULAR | Status: DC
  Administered 2016-10-18 – 2016-10-20 (×7): 60 mg via INTRAVENOUS
  Filled 2016-10-18 (×5): qty 0.96
  Filled 2016-10-18: qty 2
  Filled 2016-10-18 (×2): qty 0.96
  Filled 2016-10-18: qty 2

## 2016-10-18 NOTE — Procedures (Signed)
ETT pulled back 3cm per MD order, now 22 @ lip

## 2016-10-18 NOTE — Progress Notes (Signed)
[place] for Warfarin Indication: pulmonary embolus  Allergies  Allergen Reactions  . Keflex [Cephalexin] Itching and Swelling    Lips and eyes swelled up  . Clindamycin/Lincomycin Itching  . Latex Itching  . Sulfa Antibiotics Rash    Vital Signs: Temp: 98.4 F (36.9 C) (10/29 1228) Temp Source: Oral (10/29 1228) BP: 169/70 (10/29 1157) Pulse Rate: 96 (10/29 1157)  Labs:  Recent Labs  10/16/16 0400 10/17/16 0330 10/18/16 0420  HGB 9.9* 9.6* 9.1*  HCT 30.1* 27.9* 27.4*  PLT 355 421* 488*  LABPROT 24.5* 25.5* 27.0*  INR 2.17 2.28 2.44  CREATININE 3.51* 3.75* 3.84*    Estimated Creatinine Clearance: 16.8 mL/min (by C-G formula based on SCr of 3.84 mg/dL (H)).  Assessment: 65 yo F presented to ED after syncopal event and pneumonia with history of PE, reportedly noncompliant with Xarelto prior to admission now transitioned to warfarin. INR remains therapeutic. No bleeding noted. Of note, patient is on Levaquin which can increase the INR. CBC is stable.   Goal of Therapy:  INR 2-3 Monitor platelets by anticoagulation protocol: Yes    Plan:  -Repeat Coumadin 5mg  po x1 -Monitor daily CBC, INR, S/Sx bleeding -Watch drug-drug interaction with Levaquin closely  Salome Arnt, PharmD, BCPS Pager # [phone] 10/18/2016 1:44 PM

## 2016-10-18 NOTE — Progress Notes (Signed)
SLP Cancellation Note  Patient Details Name: YAMILED PICONE MRN: [id number] DOB: July 09, 1951   Cancelled treatment:       Reason Eval/Treat Not Completed: Medical issues which prohibited therapy. Pt still intubated. Will continue to follow for bedside swallow eval readiness.   Kern Reap, [place], CCC-SLP 10/18/2016, 8:49 AM [phone]

## 2016-10-18 NOTE — Procedures (Signed)
Post extubation pt developed stridor and inability to clear secretions.  Pt re-intubated at this time at bedside by ICU MD, 7.5 ETT secured 25 @ lip, +ETCo2 color change, =BBS, PCXR pending, placed on previous vent settings.

## 2016-10-18 NOTE — Progress Notes (Signed)
Today update: Pt was extubated at 10 am, but had developed severe hoarse breathing. Pt was re intubated (see MAR) at 12pm. Fentanyl and Precedex infusing.  No BM for today. UO is getting better. Family called and updated

## 2016-10-18 NOTE — Progress Notes (Signed)
PULMONARY / CRITICAL CARE MEDICINE   Name: Joy Butler MRN: [id number] DOB: Jan 22, 1951    ADMISSION DATE:  10/10/2016 CONSULTATION DATE:  10/11/16   REFERRING MD:  Dr. Reesa Chew / IMTS   CHIEF COMPLAINT:  Multi-focal PNA / Acute Respiratory Failure   HISTORY OF PRESENT ILLNESS:   66 y/o F, occasional cigarette smoking, with PMH of arthritis, cataracts, chronic back pain, migraines, depression, HLD, HTN, splenic infarct, DM II, PE (2011, on xarelto with intermittent compliance) who presented to [facility] on 10/22 with reports of lower extremity pain and shortness of breath. The patient also reported a possible syncopal episode.  She presented to the ER via EMS. Per report, she had a memory of the fall. EMS found the patient to be hypoxic with room air saturations of 80%. She does not wear home oxygen at baseline. She was placed on a nonrebreather with improvement to 90%. The patient reported development of a cough, weakness and "not feeling well" one week prior to presentation. Initial ER evaluation found her to have increased work of breathing on exam with saturations in the 70s on room air. She had a mild elevation of lactic acid at 2.84 and rectal temperature of 100.8. The patient was found to have a chest x-ray concerning for multifocal pneumonia. Initially she was not hypotensive. However, later in the morning the patient developed hypotension and increased work of breathing. ABG evaluation demonstrated worsening acidosis. Initial labs-NA 138, K3.3, creatinine 3.85 (up from 2.46), bun 58, anion gap 14, alkaline phosphatase 123, albumin 1.6, BNP 284, troponin 0.07, WBC 18.7, hemoglobin 13.1, and platelets 397.  She felt nausea prior to intubation and vomited (the patient was able to notify staff, sat upright and secretions cleared via suction). Due to bilateral opacities, hypoxic respiratory failure and developing hypotension the decision was made to intubate the patient for airway protection.  PCCM  called for ICU admission.  SUBJECTIVE:  No events overnight.  Weaning well this AM and much more interactive.  VITAL SIGNS: BP (!) 121/56   Pulse 73   Temp 98.2 F (36.8 C) (Oral)   Resp (!) 23   Ht 5\' 9"  (1.753 m)   Wt 87.9 kg (193 lb 12.6 oz)   SpO2 98%   BMI 28.62 kg/m   HEMODYNAMICS:    VENTILATOR SETTINGS: Vent Mode: CPAP;PSV FiO2 (%):  [40 %] 40 % Set Rate:  [18 bmp] 18 bmp Vt Set:  [580 mL] 580 mL PEEP:  [5 cmH20] 5 cmH20 Pressure Support:  [8 cmH20-12 cmH20] 8 cmH20 Plateau Pressure:  [12 cmH20-18 cmH20] 12 cmH20  INTAKE / OUTPUT: I/O last 3 completed shifts: In: 2383.8 [I.V.:508.8; NG/GT:1825; IV Piggyback:50] Out: 2520 [Urine:2520]  PHYSICAL EXAMINATION: General:  Chronically ill appearing female, NAD, intubated Neuro: Eyes opens to voice, nods answers to questions and following commands  HEENT:  MM pink/dry, no jvd, ETT in place Cardiovascular:  s1s2 rrr, no m/r/g Lungs: CTAB, improved  Abdomen:  Obese/soft, mildly TTP diffusely, bsx4 active  Musculoskeletal:  No acute deformities, BLE symmetrical  Skin:  Warm/dry, no edema  LABS:  BMET  Recent Labs Lab 10/16/16 0400 10/17/16 0330 10/18/16 0420  NA 143 145 147*  K 3.8 3.3* 3.7  CL 108 106 110  CO2 24 25 24   BUN 106* 132* 152*  CREATININE 3.51* 3.75* 3.84*  GLUCOSE 152* 175* 192*    Electrolytes  Recent Labs Lab 10/16/16 0400 10/17/16 0330 10/18/16 0420  CALCIUM 8.7* 8.5* 8.4*  MG 2.2 2.1 2.1  PHOS 3.6 3.9 4.6   CBC  Recent Labs Lab 10/16/16 0400 10/17/16 0330 10/18/16 0420  WBC 24.5* 22.5* 20.4*  HGB 9.9* 9.6* 9.1*  HCT 30.1* 27.9* 27.4*  PLT 355 421* 488*   Coag's  Recent Labs Lab 10/16/16 0400 10/17/16 0330 10/18/16 0420  INR 2.17 2.28 2.44   Sepsis Markers  Recent Labs Lab 10/11/16 1208 10/11/16 1429 10/12/16 0400 10/13/16 0255  LATICACIDVEN 2.2* 2.2*  --   --   PROCALCITON 42.77  --  59.52 46.55   ABG  Recent Labs Lab 10/16/16 0322  10/18/16 0440 10/18/16 0455  PHART 7.441 7.476* 7.507*  PCO2ART 33.2 38.2 32.2  PO2ART 82.0* 86.8 82.7*   Liver Enzymes  Recent Labs Lab 10/11/16 1208 10/13/16 0255 10/14/16 0448  AST 14*  --   --   ALT 12*  --   --   ALKPHOS 112  --   --   BILITOT 2.5*  --   --   ALBUMIN 1.5* 1.1* 1.1*   Cardiac Enzymes  Recent Labs Lab 10/11/16 1806 10/11/16 2226 10/12/16 0400  TROPONINI 0.08* 0.62* 0.23*   Glucose  Recent Labs Lab 10/17/16 1223 10/17/16 1631 10/17/16 2015 10/18/16 0012 10/18/16 0426 10/18/16 0822  GLUCAP 182* 159* 150* 168* 165* 180*   Imaging No results found. STUDIES:  ECHO 10/22: LVEF 40-45% with mild LDH and G1DD (worse compared to EF of 50-55% 08/2012) CT Chest 10/22 >> extensive bilateral consolidative changes consistent with multifocal pneumonia, mild cardiomegaly with multivessel coronary disease  CULTURES: Influenza 10/22: negative BCx2 10/22 >> strep pneumo UA 10/22: proteinuria, small hgb, moderate bilirubin, negative nitrites and leukocytes; rbc 0-5 on add-on UC 10/22: < 10,000 colonies Strep pneumo urinary Ag 10/22: positive Respiratory panel 10/22: Negative  ANTIBIOTICS: Vanco 10/22 >> 10/24 Levaquin 10/22 >>   SIGNIFICANT EVENTS: 10/22  Admit per IMTS with PNA, failed BiPAP & required intubation  LINES/TUBES: ETT 10/22 >>  PIV x 2>> Foley 10/22 >>  DISCUSSION: [name] with PMH of PE (non-compliant with xarelto), HTN, CKD, HLD admitted 10/22 per IMTS with multifocal PNA.  Developed worsening respiratory failure requiring intubation in ER. Found to have Strep pneumo bacteremia  ASSESSMENT / PLAN:  PULMONARY A: Acute Hypoxic Respiratory Failure - in the setting of multi-focal PNA  Multi-focal PNA - R>L opacities  Hx PE - on Xarelto, non-compliant with anticoagulation  Strep pneumo positive with urine Ag testing Net pos 0.9 L P:   Hold further diureses at this point Extubate IS per RT protocol Wean FiO2 for sats >  92% Trend CXR - stable See ID section Heparin gtt per pharmacy for hx PE, once able to take PO will switch to oral agent SLP Mobilize Duonebs q6h  CARDIOVASCULAR A:  Septic Shock - in setting of multifocal PNA Syncopal Episode - prior to admit Hx HTN, HLD P:  ICU monitoring of hemodynamics  KVO IVF once TF are started D/Ced pressors ECHO without RV abnormality (hx non-compliance with anticoagulation) Continue home norvasc  RENAL A:   Acute on Chronic Kidney Injury  - baseline sr cr ~ 2.4 Hypokalemia Pseudo-hypocalcemia - corrects normal (8.7) for hypoalbuminemia At least CKD IIIB given labs earlier this year Renal function deteriorating. P:   Trend BMP/UOP  KVO IVF given hyperchloremia Hold further lasix at this point Replace electrolytes as indicated. If UOP drops then will likely need to call renal for ? Of dialysis, no acute indications right now.  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  ENDOCRINE A:   DM - last hgb A1c 6.2 on 07/06/16   CBGs low 100s P:   SSI  CBGs  NEUROLOGIC A:   Chronic Back Pain Migraines   P:   RASS goal: 0 to -1  D/C versed/fentanyl PRN fentanyl for pain only. D/C precedex  FAMILY  - Updates: No family at bedside.   - Inter-disciplinary family meet or Palliative Care meeting due by:  10/29  The patient is  critically ill with multiple organ systems failure and requires high complexity decision making for assessment and support, frequent evaluation and titration of therapies, application of advanced monitoring technologies and extensive interpretation of multiple databases.   Critical Care Time devoted to patient care services described in this note is  35  Minutes. This time reflects time of care of this signee Dr Jennet Maduro. This critical care time does not reflect procedure time, or teaching time or supervisory time of PA/NP/Med student/Med Resident etc but could involve care discussion time.  Rush Farmer, M.D. [facility] Pulmonary/Critical Care Medicine. Pager: [phone]. After hours pager: [phone].

## 2016-10-18 NOTE — Procedures (Signed)
Intubation Procedure Note Joy Butler Joy [id number] 1951/08/07  Procedure: Intubation Indications: Stridor  Procedure Details Consent: Risks of procedure as well as the alternatives and risks of each were explained to the (patient/caregiver).  Consent for procedure obtained. Time Out: Verified patient identification, verified procedure, site/side was marked, verified correct patient position, special equipment/implants available, medications/allergies/relevent history reviewed, required imaging and test results available.  Performed  Maximum sterile technique was used including gloves, hand hygiene and mask.  MAC    Evaluation Hemodynamic Status: BP stable throughout; O2 sats: stable throughout Patient's Current Condition: stable Complications: No apparent complications Patient did tolerate procedure well. Chest X-ray ordered to verify placement.  CXR: pending.   YACOUB,WESAM 10/18/2016

## 2016-10-18 NOTE — Procedures (Signed)
Extubation Procedure Note  Patient Details:   Name: Joy Butler DOB: 1951/11/10 MRN: [id number]   Airway Documentation:  Airway 7.5 mm (Active)  Secured at (cm) 23 cm 10/18/2016  8:26 AM  Measured From Lips 10/18/2016  8:26 AM  Secured Location Right 10/18/2016  8:26 AM  Secured By [employer] 10/18/2016  8:26 AM  Tube Holder Repositioned Yes 10/18/2016  8:26 AM  Cuff Pressure (cm H2O) 26 cm H2O 10/18/2016  4:41 AM  Site Condition Dry 10/18/2016  8:26 AM    Evaluation  O2 sats: stable throughout Complications: No apparent complications Patient did tolerate procedure well. Bilateral Breath Sounds: Rhonchi   Yes   Positive cuff leak noted. Pt placed on nasal cannula 4 Lpm with humidity, no stridor, Pt able to get 600 on incentive spirometer.  Mingo Amber Sabrina Arriaga 10/18/2016, 10:35 AM

## 2016-10-18 NOTE — Progress Notes (Signed)
Pt extubated, but  Continues low pitch hourse sounds. IS and BF done. Pt follows commands. Small secretions with suction.  RT aware, paged MD.

## 2016-10-19 DIAGNOSIS — J181 Lobar pneumonia, unspecified organism: Secondary | ICD-10-CM

## 2016-10-19 LAB — BASIC METABOLIC PANEL
Anion gap: 13 (ref 5–15)
Anion gap: 14 (ref 5–15)
BUN: 158 mg/dL — AB (ref 6–20)
BUN: 168 mg/dL — ABNORMAL HIGH (ref 6–20)
CHLORIDE: 112 mmol/L — AB (ref 101–111)
CO2: 22 mmol/L (ref 22–32)
CO2: 21 mmol/L — AB (ref 22–32)
CREATININE: 3.81 mg/dL — AB (ref 0.44–1.00)
CREATININE: 3.76 mg/dL — AB (ref 0.44–1.00)
Calcium: 8.6 mg/dL — ABNORMAL LOW (ref 8.9–10.3)
Calcium: 8.9 mg/dL (ref 8.9–10.3)
Chloride: 113 mmol/L — ABNORMAL HIGH (ref 101–111)
GFR calc Af Amer: 13 mL/min — ABNORMAL LOW (ref >60)
GFR calc non Af Amer: 11 mL/min — ABNORMAL LOW (ref >60)
GFR, EST AFRICAN AMERICAN: 13 mL/min — AB (ref >60)
GFR, EST NON AFRICAN AMERICAN: 11 mL/min — AB (ref >60)
Glucose, Bld: 323 mg/dL — ABNORMAL HIGH (ref 65–99)
Glucose, Bld: 304 mg/dL — ABNORMAL HIGH (ref 65–99)
POTASSIUM: 4 mmol/L (ref 3.5–5.1)
Potassium: 4 mmol/L (ref 3.5–5.1)
SODIUM: 148 mmol/L — AB (ref 135–145)
Sodium: 147 mmol/L — ABNORMAL HIGH (ref 135–145)

## 2016-10-19 LAB — BLOOD GAS, ARTERIAL

## 2016-10-19 LAB — CBC
HCT: 28.4 % — ABNORMAL LOW (ref 36.0–46.0)
Hemoglobin: 9.6 g/dL — ABNORMAL LOW (ref 12.0–15.0)
MCH: 27.6 pg (ref 26.0–34.0)
MCHC: 33.8 g/dL (ref 30.0–36.0)
MCV: 81.6 fL (ref 78.0–100.0)
PLATELETS: 595 10*3/uL — AB (ref 150–400)
RBC: 3.48 MIL/uL — ABNORMAL LOW (ref 3.87–5.11)
RDW: 17.5 % — ABNORMAL HIGH (ref 11.5–15.5)
WBC: 21.4 10*3/uL — ABNORMAL HIGH (ref 4.0–10.5)

## 2016-10-19 LAB — PROTIME-INR
INR: 2.78
PROTHROMBIN TIME: 29.9 s — AB (ref 11.4–15.2)

## 2016-10-19 LAB — MAGNESIUM: MAGNESIUM: 2.4 mg/dL (ref 1.7–2.4)

## 2016-10-19 LAB — GLUCOSE, CAPILLARY
GLUCOSE-CAPILLARY: 316 mg/dL — AB (ref 65–99)
GLUCOSE-CAPILLARY: 289 mg/dL — AB (ref 65–99)
GLUCOSE-CAPILLARY: 271 mg/dL — AB (ref 65–99)
Glucose-Capillary: 240 mg/dL — ABNORMAL HIGH (ref 65–99)
Glucose-Capillary: 275 mg/dL — ABNORMAL HIGH (ref 65–99)
Glucose-Capillary: 320 mg/dL — ABNORMAL HIGH (ref 65–99)

## 2016-10-19 LAB — PHOSPHORUS: Phosphorus: 6 mg/dL — ABNORMAL HIGH (ref 2.5–4.6)

## 2016-10-19 LAB — HEPARIN LEVEL (UNFRACTIONATED): HEPARIN UNFRACTIONATED: 0.49 [IU]/mL (ref 0.30–0.70)

## 2016-10-19 MED ORDER — DEXTROSE 5 % IV SOLN
INTRAVENOUS | Status: DC
  Administered 2016-10-19 – 2016-10-20 (×2): via INTRAVENOUS

## 2016-10-19 MED ORDER — VITAMIN K1 10 MG/ML IJ SOLN
2.5000 mg | Freq: Once | INTRAMUSCULAR | Status: AC
  Administered 2016-10-19: 2.5 mg via INTRAVENOUS
  Filled 2016-10-19: qty 0.25

## 2016-10-19 MED ORDER — HEPARIN (PORCINE) IN NACL 100-0.45 UNIT/ML-% IJ SOLN
1900.0000 [IU]/h | INTRAMUSCULAR | Status: DC
  Administered 2016-10-19 – 2016-10-20 (×2): 1900 [IU]/h via INTRAVENOUS
  Filled 2016-10-19 (×4): qty 250

## 2016-10-19 MED ORDER — VITAMIN K1 10 MG/ML IJ SOLN
5.0000 mg | Freq: Once | INTRAVENOUS | Status: DC
  Filled 2016-10-19: qty 0.5

## 2016-10-19 MED ORDER — WARFARIN SODIUM 3 MG PO TABS: 3.0000 mg | ORAL_TABLET | Freq: Once | ORAL | Status: DC

## 2016-10-19 MED ORDER — VITAL AF 1.2 CAL PO LIQD
1000.0000 mL | ORAL | Status: DC
  Administered 2016-10-19: 1000 mL

## 2016-10-19 MED ORDER — INSULIN ASPART 100 UNIT/ML ~~LOC~~ SOLN
0.0000 [IU] | SUBCUTANEOUS | Status: DC
  Administered 2016-10-19 (×2): 8 [IU] via SUBCUTANEOUS
  Administered 2016-10-19: 5 [IU] via SUBCUTANEOUS
  Administered 2016-10-19: 8 [IU] via SUBCUTANEOUS
  Administered 2016-10-20: 5 [IU] via SUBCUTANEOUS
  Administered 2016-10-20: 11 [IU] via SUBCUTANEOUS
  Administered 2016-10-20 (×2): 15 [IU] via SUBCUTANEOUS
  Administered 2016-10-20: 5 [IU] via SUBCUTANEOUS
  Administered 2016-10-20: 15 [IU] via SUBCUTANEOUS
  Administered 2016-10-21 (×2): 8 [IU] via SUBCUTANEOUS
  Administered 2016-10-21: 11 [IU] via SUBCUTANEOUS

## 2016-10-19 MED ORDER — INSULIN ASPART 100 UNIT/ML ~~LOC~~ SOLN
4.0000 [IU] | SUBCUTANEOUS | Status: DC
Start: 2016-10-19 — End: 2016-10-21
  Administered 2016-10-19 – 2016-10-21 (×13): 4 [IU] via SUBCUTANEOUS

## 2016-10-19 MED ORDER — INSULIN GLARGINE 100 UNIT/ML ~~LOC~~ SOLN
15.0000 [IU] | Freq: Every day | SUBCUTANEOUS | Status: DC
  Administered 2016-10-19: 15 [IU] via SUBCUTANEOUS
  Filled 2016-10-19 (×2): qty 0.15

## 2016-10-19 MED ORDER — POTASSIUM CHLORIDE 10 MEQ/100ML IV SOLN
10.0000 meq | INTRAVENOUS | Status: AC
  Administered 2016-10-19 (×4): 10 meq via INTRAVENOUS
  Filled 2016-10-19 (×3): qty 100

## 2016-10-19 NOTE — Progress Notes (Signed)
100 ml fentanyl wasted in the sink. Witnessed by Donald Siva RN.

## 2016-10-19 NOTE — Progress Notes (Signed)
PT Cancellation Note/ Discharge  Patient Details Name: Joy Butler MRN: [id number] DOB: Sep 04, 1951   Cancelled Treatment:    Reason Eval/Treat Not Completed: Medical issues which prohibited therapy (pt intubated after order received, will sign off and await new order)   Lanetta Inch Beth 10/19/2016, 7:18 AM Elwyn Reach, [place]

## 2016-10-19 NOTE — Progress Notes (Signed)
Inpatient Diabetes Program Recommendations  AACE/ADA: New Consensus Statement on Inpatient Glycemic Control (2015)  Target Ranges:  Prepandial:   less than 140 mg/dL      Peak postprandial:   less than 180 mg/dL (1-2 hours)      Critically ill patients:  140 - 180 mg/dL   Review of Glycemic Control  Diabetes history: DM 2 Outpatient Diabetes medications: 70/30 25 units BID (Long acting Basal equivalent 35 units, Short acting insulin equivalent 15 units) Current orders for Inpatient glycemic control: Lantus 15 units, Novolog Moderate Q4, Novolog 4 units tube feed coverage Q4hours  Inpatient Diabetes Program Recommendations:   IV Solumedrol 60 mg Q 6hours. Glucose trends in the 200-300 range. Please consider placing patient on the ICU Hyperglycemia Protocol Phase 2 IV insulin while patient is intubated and on Tube Feeds.  Thanks,  Tama Headings RN, MSN, [facility] Inpatient Diabetes Coordinator Team Pager [phone] (8a-5p)

## 2016-10-19 NOTE — Care Management Note (Signed)
Case Management Note  Patient Details  Name: Joy Butler MRN: [id number] Date of Birth: 1951/02/15  Subjective/Objective:    Pt admitted with with CAP                Action/Plan:  Pt is now intubated   Expected Discharge Date:                  Expected Discharge Plan:     In-House Referral:     Discharge planning Services  CM Consult  Post Acute Care Choice:    Choice offered to:     DME Arranged:    DME Agency:     HH Arranged:    HH Agency:     Status of Service:  In process, will continue to follow  If discussed at Long Length of Stay Meetings, dates discussed:    Additional Comments: 10/19/2016  Pt remains intubated.  CM was able to reach husband by phone.  Husband informed CM; pt uses walker and cane in the home when necessary due to pre - existing knee issues, pt is independent with all ADL's, husband denied barriers to obtaining medications, and stated wife does not receive any HH.  CM will continue to follow for discharge needs  10/16/16 Pt remains intubated-  CM unable to reach family/husband.  CM will continue to follow for discharge needs Maryclare Labrador, RN 10/19/2016, 2:42 PM

## 2016-10-19 NOTE — Progress Notes (Signed)
SLP Cancellation Note  Patient Details Name: Joy Butler MRN: [id number] DOB: Apr 21, 1951   Cancelled treatment:       Reason Eval/Treat Not Completed: Patient not medically ready, was extubated on previous date but then reintubated. Will s/o off and await new orders once medically ready.   [name] 10/19/2016, 8:57 AM  [name], M.A. CCC-SLP [phone]

## 2016-10-19 NOTE — Progress Notes (Signed)
Pt placed back on full support due to increased WOB inc RR, pt tolerating well

## 2016-10-19 NOTE — Progress Notes (Signed)
Nutrition Follow-up  DOCUMENTATION CODES:   Obesity unspecified  INTERVENTION:    Change TF to Vital AF 1.2 at goal rate of 60 ml/h (1440 ml per day) to provide 1728 kcal, 108 gm protein, 1168 ml free water daily.  NUTRITION DIAGNOSIS:   Inadequate oral intake related to inability to eat as evidenced by NPO status.  Ongoing  GOAL:   Provide needs based on ASPEN/SCCM guidelines  Met  MONITOR:   Vent status, Labs, TF tolerance, I & O's  ASSESSMENT:   [name], occasional cigarette smoking, with PMH of arthritis, cataracts, chronic back pain, migraines, depression, HLD, HTN, splenic infarct, DM II, PE (2011, on xarelto with intermittent compliance) who presented to [facility] on 10/22 with reports of lower extremity pain and shortness of breath. Admitted with multi-focal PNA / acute respiratory failure.  Patient remains on ventilator support MV: 10 L/min Temp (24hrs), Avg:97.9 F (36.6 C), Min:97.3 F (36.3 C), Max:98.4 F (36.9 C)  Patient is currently receiving Vital High Protein via OGT at 45 ml/h with Prostat 30 ml TID to provide 1380 kcals, 140 gm protein, 903 ml free water daily.  Still has ileus per RN, but is tolerating TF well.  Labs reviewed: sodium elevated at 148, phosphorus elevated at 6 CBG's: [phone] Medications reviewed and include KCl, Solumedrol, Insulin. Patient no longer meets criteria for obesity or hypocaloric feeding. Will adjust TF regimen.  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  Estimated Nutritional Needs:   Kcal:  1683  Protein:  100-120 gm  Fluid:  >/= 1.7 L  EDUCATION NEEDS:   No education needs identified at this  time  Molli Barrows, [place], [place], [place] Pager [phone] After Hours Pager [phone]

## 2016-10-19 NOTE — Progress Notes (Signed)
PULMONARY / CRITICAL CARE MEDICINE   Name: Joy Butler MRN: [id number] DOB: 1951-02-28    ADMISSION DATE:  10/10/2016 CONSULTATION DATE:  10/11/16   REFERRING MD:  Dr. Reesa Chew / IMTS   CHIEF COMPLAINT:  Multi-focal PNA / Acute Respiratory Failure   HISTORY OF PRESENT ILLNESS:   [name], occasional cigarette smoking, with PMH of arthritis, cataracts, chronic back pain, migraines, depression, HLD, HTN, splenic infarct, DM II, PE (2011, on xarelto with intermittent compliance) who presented to [facility] on 10/22 with reports of lower extremity pain and shortness of breath. The patient also reported a possible syncopal episode.  She presented to the ER via EMS. Per report, she had a memory of the fall. EMS found the patient to be hypoxic with room air saturations of 80%. She does not wear home oxygen at baseline. She was placed on a nonrebreather with improvement to 90%. The patient reported development of a cough, weakness and "not feeling well" one week prior to presentation. Initial ER evaluation found her to have increased work of breathing on exam with saturations in the 70s on room air. She had a mild elevation of lactic acid at 2.84 and rectal temperature of 100.8. The patient was found to have a chest x-ray concerning for multifocal pneumonia. Initially she was not hypotensive. However, later in the morning the patient developed hypotension and increased work of breathing. ABG evaluation demonstrated worsening acidosis. Initial labs-NA 138, K3.3, creatinine 3.85 (up from 2.46), bun 58, anion gap 14, alkaline phosphatase 123, albumin 1.6, BNP 284, troponin 0.07, WBC 18.7, hemoglobin 13.1, and platelets 397.  She felt nausea prior to intubation and vomited (the patient was able to notify staff, sat upright and secretions cleared via suction). Due to bilateral opacities, hypoxic respiratory failure and developing hypotension the decision was made to intubate the patient for airway protection.  PCCM  called for ICU admission.  SUBJECTIVE:  No events overnight.  Weaning well this AM and much more interactive.  VITAL SIGNS: BP 137/62   Pulse 73   Temp 97.5 F (36.4 C) (Oral)   Resp 19   Ht 5\' 9"  (1.753 m)   Wt 193 lb 9 oz (87.8 kg)   SpO2 99%   BMI 28.58 kg/m   HEMODYNAMICS:    VENTILATOR SETTINGS: Vent Mode: PRVC FiO2 (%):  [40 %-100 %] 40 % Set Rate:  [18 bmp] 18 bmp Vt Set:  [530 mL-580 mL] 530 mL PEEP:  [5 cmH20] 5 cmH20 Pressure Support:  [8 cmH20] 8 cmH20 Plateau Pressure:  [12 cmH20-22 cmH20] 17 cmH20  INTAKE / OUTPUT: I/O last 3 completed shifts: In: 2730.5 [I.V.:813; NG/GT:1867.5; IV Piggyback:50] Out: 2160 [Urine:2160]  PHYSICAL EXAMINATION: General:  Chronically ill appearing female, NAD, intubated Neuro: Eyes opens to voice, nods answers to questions and following commands  HEENT:  MM pink/dry, no jvd, ETT in place Cardiovascular:  s1s2 rrr, no m/r/g Lungs: CTAB, improved  Abdomen:  Obese/soft, mildly TTP diffusely, bsx4 active  Musculoskeletal:  No acute deformities, BLE symmetrical  Skin:  Warm/dry, no edema  LABS:  BMET  Recent Labs Lab 10/17/16 0330 10/18/16 0420 10/19/16 0456  NA 145 147* 148*  K 3.3* 3.7 4.0  CL 106 110 113*  CO2 25 24 22   BUN 132* 152* 158*  CREATININE 3.75* 3.84* 3.81*  GLUCOSE 175* 192* 323*    Electrolytes  Recent Labs Lab 10/17/16 0330 10/18/16 0420 10/19/16 0456  CALCIUM 8.5* 8.4* 8.6*  MG 2.1 2.1 2.4  PHOS  3.9 4.6 6.0*   CBC  Recent Labs Lab 10/17/16 0330 10/18/16 0420 10/19/16 0456  WBC 22.5* 20.4* 21.4*  HGB 9.6* 9.1* 9.6*  HCT 27.9* 27.4* 28.4*  PLT 421* 488* 595*   Coag's  Recent Labs Lab 10/17/16 0330 10/18/16 0420 10/19/16 0456  INR 2.28 2.44 2.78   Sepsis Markers  Recent Labs Lab 10/13/16 0255  PROCALCITON 46.55   ABG  Recent Labs Lab 10/16/16 0322 10/18/16 0440 10/18/16 0455  PHART 7.441 7.476* 7.507*  PCO2ART 33.2 38.2 32.2  PO2ART 82.0* 86.8 82.7*    Liver Enzymes  Recent Labs Lab 10/13/16 0255 10/14/16 0448  ALBUMIN 1.1* 1.1*   Cardiac Enzymes No results for input(s): TROPONINI, PROBNP in the last 168 hours. Glucose  Recent Labs Lab 10/18/16 0822 10/18/16 1227 10/18/16 1727 10/18/16 1938 10/19/16 0003 10/19/16 0404  GLUCAP 180* 137* 143* 192* 320* 316*   Imaging Dg Chest Port 1 View  Result Date: 10/18/2016 CLINICAL DATA:  Intubated. EXAM: PORTABLE CHEST 1 VIEW COMPARISON:  10/16/2016. FINDINGS: The endotracheal tube tip is just above the carina. Increased left lower lobe airspace opacity. Decreased mild patchy opacity at the right lung base. Thoracic spine degenerative changes. IMPRESSION: 1. Dense left lower lobe atelectasis or pneumonia, increased. 2. Improved mild patchy atelectasis or pneumonia at the right lung base. 3. Low position of the endotracheal tube. It is recommended that this be retracted 4 5 cm. Electronically Signed   By: Claudie Revering M.D.   On: 10/18/2016 12:59   Dg Abd Portable 1v  Result Date: 10/18/2016 CLINICAL DATA:  OG tube EXAM: PORTABLE ABDOMEN - 1 VIEW COMPARISON:  10/11/2016 FINDINGS: Enteric tube terminates in the mid gastric body. Mildly dilated loops of small bowel in the left mid/ lower abdomen. Degenerative changes of the lumbar spine. IMPRESSION: Enteric tube terminates in the mid gastric body. Mildly dilated small bowel in the left mid/ lower abdomen, possibly reflecting partial small bowel obstruction versus adynamic small bowel ileus. Electronically Signed   By: Julian Hy M.D.   On: 10/18/2016 12:59   STUDIES:  ECHO 10/22: LVEF 40-45% with mild LDH and G1DD (worse compared to EF of 50-55% 08/2012) CT Chest 10/22 >> extensive bilateral consolidative changes consistent with multifocal pneumonia, mild cardiomegaly with multivessel coronary disease  CULTURES: Influenza 10/22: negative BCx2 10/22 >> strep pneumo UA 10/22: proteinuria, small hgb, moderate bilirubin, negative  nitrites and leukocytes; rbc 0-5 on add-on UC 10/22: < 10,000 colonies Strep pneumo urinary Ag 10/22: positive Respiratory panel 10/22: Negative  ANTIBIOTICS: Vanco 10/22 >> 10/24 Levaquin 10/22 >>   SIGNIFICANT EVENTS: 10/22  Admit per IMTS with PNA, failed BiPAP & required intubation  LINES/TUBES: ETT 10/22 >> 10/29 PIV x 2>> Foley 10/22 >> ETT 10/29 >>  DISCUSSION: [name] with PMH of PE (non-compliant with xarelto), HTN, CKD, HLD admitted 10/22 per IMTS with multifocal PNA.  Developed worsening respiratory failure requiring intubation in ER. Found to have Strep pneumo bacteremia. Extubated 10/29 but reintubated for significant stridor; steroids started.   ASSESSMENT / PLAN:  PULMONARY A: Acute Hypoxic Respiratory Failure - in the setting of multi-focal PNA  Multi-focal PNA - R>L opacities  Hx PE - on Xarelto, non-compliant with anticoagulation  Strep pneumo positive with urine Ag testing Net neg 3.2 L P:   Hold further diureses at this point Wean off vent as able.  IS per RT protocol Wean FiO2 for sats > 92% Trend CXR - stable See ID section Solumedrol 60 mg q6h  x 4 doses then dc Consider trach Duonebs q6h  CARDIOVASCULAR A:  Septic Shock - in setting of multifocal PNA Syncopal Episode - prior to admit Hx HTN, HLD P:  ICU monitoring of hemodynamics  KVO IVF once TF are started D/Ced pressors ECHO without RV abnormality (hx non-compliance with anticoagulation) Continue home norvasc Lopressor 2.5 mg q3h prn  RENAL A:   Acute on Chronic Kidney Injury  - baseline sr cr ~ 2.4 Hypokalemia - stable Pseudo-hypocalcemia - corrects normal (8.7) for hypoalbuminemia 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 Start free water flushes If UOP drops then will likely need to call renal  for ? Of dialysis, no acute indications right now.  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, non-compliant coumadin induced coagulapthy P:  Trend CBC Dc coumadin, may need trach, add vit K , add hep drip, coags in am   INFECTIOUS A:   Multifocal PNA  Leukocytosis not resolved --> 19.8 > 18.6 > 21.4 P:   Follow pan cultures  Abx as above, note allergies (keflex - facial swelling, clinda, sulfa) Trend PCT down Vanc off 10/24 Day 9 levaquin, consider 10 days as rapid progress infectious wise  ENDOCRINE A:   DM - last hgb A1c 6.2 on 07/06/16   CBGs low 300s P:   SSI  Lantus and TF coverage started with steroids CBGs q4h Allow dc steroids  NEUROLOGIC A:   Chronic Back Pain Migraines   P:   RASS goal: 0 to -1  D/C versed Continue precedex and fentanyl with re-intubation  FAMILY  - Updates: No family at bedside. Updated by phone 10/29.  Olene Floss, MD [facility], PGY-2  STAFF NOTE: I, Merrie Roof, MD FACP have personally reviewed patient's available data, including medical history, events of note, physical examination and test results as part of my evaluation. I have discussed with resident/NP and other care providers such as pharmacist, RN and RRT. In addition, I personally evaluated patient and elicited key findings of: awake, FC, improved coarse BS rt, no edema, WEaning today , cpap 5 ps5, no extubation until possible am , allow steroids to work, dc steroids after 4 doses, keep pos, add d5w, follow up na in pm and am , no trach indicated at this stage, dc coumadin, start hep, add vit K (low dose) Incase trach needed, rapid infectious progress - change to 10 days abx, follow renal fxn with continued correction NA, WUA, precedex The patient is critically ill  with multiple organ systems failure and requires high complexity decision making for assessment and support, frequent evaluation and titration of therapies, application of advanced monitoring technologies and extensive interpretation of multiple databases.   Critical Care Time devoted to patient care services described in this note is 35 Minutes. This time reflects time of care of this signee: Merrie Roof, MD FACP. This critical care time does not reflect procedure time, or teaching time or supervisory time of PA/NP/Med student/Med Resident etc but could involve care discussion time. Rest per NP/medical resident whose note is outlined above and that I agree with   Quillian Quince  J. Titus Mould, MD, [place] Pgr: [facility] 10/19/2016 9:54 AM

## 2016-10-19 NOTE — Progress Notes (Signed)
ANTICOAGULATION CONSULT NOTE - Follow Up Consult  Pharmacy Consult for Heparin Indication: pulmonary embolus  Allergies  Allergen Reactions  . Keflex [Cephalexin] Itching and Swelling    Lips and eyes swelled up  . Clindamycin/Lincomycin Itching  . Latex Itching  . Sulfa Antibiotics Rash    Patient Measurements: Height: 5\' 9"  (175.3 cm) Weight: 193 lb 9 oz (87.8 kg) IBW/kg (Calculated) : 66.2 Heparin Dosing Weight: 86 kg  Vital Signs: Temp: 97.5 F (36.4 C) (10/30 2030) Temp Source: Oral (10/30 2030) BP: 135/62 (10/30 1600) Pulse Rate: 63 (10/30 2011)  Labs:  Recent Labs  10/17/16 0330 10/18/16 0420 10/19/16 0456 10/19/16 2021  HGB 9.6* 9.1* 9.6*  --   HCT 27.9* 27.4* 28.4*  --   PLT 421* 488* 595*  --   LABPROT 25.5* 27.0* 29.9*  --   INR 2.28 2.44 2.78  --   HEPARINUNFRC  --   --   --  0.49  CREATININE 3.75* 3.84* 3.81* 3.76*    Estimated Creatinine Clearance: 17.1 mL/min (by C-G formula based on SCr of 3.76 mg/dL (H)).  Assessment: 52 yoF w/ history of PE on Xarelto PTA. Pt was reportedly noncompliant with Xarelto so transitioned to warfarin w/ heparin bridge. INR therapeutic (2.78) today, but now considering trach so Vitamin K 2.5 mg IV given and transitioned to heparin drip.  Pt was previously therapeutic at 1900 units, restarted at same rate today.    Heparin level is therapeutic (0.49) on 1900 units/hr tonight.  Goal of Therapy:  Heparin level 0.3-0.7 units/ml Monitor platelets by anticoagulation protocol: Yes   Plan:   Continue heparin drip at 1900 units/hr.  Daily heparin level, PT/INR and CBC.  Arty Baumgartner, [place] Pager: [phone] 10/19/2016,9:43 PM

## 2016-10-19 NOTE — Progress Notes (Addendum)
[place] for Heparin Indication: pulmonary embolus  Allergies  Allergen Reactions  . Keflex [Cephalexin] Itching and Swelling    Lips and eyes swelled up  . Clindamycin/Lincomycin Itching  . Latex Itching  . Sulfa Antibiotics Rash    Vital Signs: Temp: 97.5 F (36.4 C) (10/30 0405) Temp Source: Oral (10/30 0405) BP: 137/62 (10/30 0600) Pulse Rate: 73 (10/30 0600)  Labs:  Recent Labs  10/17/16 0330 10/18/16 0420 10/19/16 0456  HGB 9.6* 9.1* 9.6*  HCT 27.9* 27.4* 28.4*  PLT 421* 488* 595*  LABPROT 25.5* 27.0* 29.9*  INR 2.28 2.44 2.78  CREATININE 3.75* 3.84* 3.81*    Estimated Creatinine Clearance: 16.9 mL/min (by C-G formula based on SCr of 3.81 mg/dL (H)).  Assessment: 75 yoF w/ history of PE on Xarelto PTA. Pt was reportedly noncompliant with Xarelto so transitioned to warfarin w/ heparin bridge. INR therapeutic and heparin off, but now considering trach so will give vitamin k and transition to heparin drip per CCM. Pt was previously therapeutic at 1900 units, will hold bolus given INR >2 and restart heparin at prior drip rate.  Goal of Therapy:  Anti-Xa level 0.3-0.7 Monitor platelets by anticoagulation protocol: Yes    Plan:  -Discontinue Coumadin and give vitamin K 2.5 mg IV x1 -Restart heparin at 1900 units/hr -Follow-up 8-hr anti-Xa level -Monitor daily CBC, anti-Xa, S/Sx bleeding  Arrie Senate, PharmD PGY-1 Pharmacy Resident Pager: [phone] 10/19/2016

## 2016-10-20 LAB — CBC
HEMATOCRIT: 32.2 % — AB (ref 36.0–46.0)
HEMOGLOBIN: 10.4 g/dL — AB (ref 12.0–15.0)
MCH: 27.5 pg (ref 26.0–34.0)
MCHC: 32.3 g/dL (ref 30.0–36.0)
MCV: 85.2 fL (ref 78.0–100.0)
Platelets: 616 10*3/uL — ABNORMAL HIGH (ref 150–400)
RBC: 3.78 MIL/uL — AB (ref 3.87–5.11)
RDW: 18.3 % — AB (ref 11.5–15.5)
WBC: 24.2 10*3/uL — AB (ref 4.0–10.5)

## 2016-10-20 LAB — GLUCOSE, CAPILLARY
GLUCOSE-CAPILLARY: 351 mg/dL — AB (ref 65–99)
GLUCOSE-CAPILLARY: 250 mg/dL — AB (ref 65–99)
GLUCOSE-CAPILLARY: 230 mg/dL — AB (ref 65–99)
Glucose-Capillary: 321 mg/dL — ABNORMAL HIGH (ref 65–99)
Glucose-Capillary: 367 mg/dL — ABNORMAL HIGH (ref 65–99)
Glucose-Capillary: 374 mg/dL — ABNORMAL HIGH (ref 65–99)

## 2016-10-20 LAB — BASIC METABOLIC PANEL
ANION GAP: 12 (ref 5–15)
Anion gap: 14 (ref 5–15)
BUN: 171 mg/dL — ABNORMAL HIGH (ref 6–20)
BUN: 159 mg/dL — ABNORMAL HIGH (ref 6–20)
CHLORIDE: 113 mmol/L — AB (ref 101–111)
CHLORIDE: 113 mmol/L — AB (ref 101–111)
CO2: 22 mmol/L (ref 22–32)
CO2: 18 mmol/L — ABNORMAL LOW (ref 22–32)
CREATININE: 3.3 mg/dL — AB (ref 0.44–1.00)
Calcium: 8.8 mg/dL — ABNORMAL LOW (ref 8.9–10.3)
Calcium: 8.6 mg/dL — ABNORMAL LOW (ref 8.9–10.3)
Creatinine, Ser: 3.57 mg/dL — ABNORMAL HIGH (ref 0.44–1.00)
GFR calc Af Amer: 14 mL/min — ABNORMAL LOW (ref >60)
GFR calc non Af Amer: 12 mL/min — ABNORMAL LOW (ref >60)
GFR, EST AFRICAN AMERICAN: 16 mL/min — AB (ref >60)
GFR, EST NON AFRICAN AMERICAN: 13 mL/min — AB (ref >60)
GLUCOSE: 399 mg/dL — AB (ref 65–99)
Glucose, Bld: 343 mg/dL — ABNORMAL HIGH (ref 65–99)
POTASSIUM: 3.5 mmol/L (ref 3.5–5.1)
Potassium: 4.1 mmol/L (ref 3.5–5.1)
SODIUM: 145 mmol/L (ref 135–145)
Sodium: 147 mmol/L — ABNORMAL HIGH (ref 135–145)

## 2016-10-20 LAB — HEPARIN LEVEL (UNFRACTIONATED)
HEPARIN UNFRACTIONATED: 0.97 [IU]/mL — AB (ref 0.30–0.70)
HEPARIN UNFRACTIONATED: 0.72 [IU]/mL — AB (ref 0.30–0.70)

## 2016-10-20 LAB — PROTIME-INR
INR: 1.95
Prothrombin Time: 22.5 seconds — ABNORMAL HIGH (ref 11.4–15.2)

## 2016-10-20 LAB — MAGNESIUM: Magnesium: 2.3 mg/dL (ref 1.7–2.4)

## 2016-10-20 LAB — PHOSPHORUS: Phosphorus: 5.2 mg/dL — ABNORMAL HIGH (ref 2.5–4.6)

## 2016-10-20 MED ORDER — INSULIN GLARGINE 100 UNIT/ML ~~LOC~~ SOLN
20.0000 [IU] | Freq: Every day | SUBCUTANEOUS | Status: DC
  Administered 2016-10-20 – 2016-10-21 (×2): 20 [IU] via SUBCUTANEOUS
  Filled 2016-10-20 (×3): qty 0.2

## 2016-10-20 MED ORDER — POLYETHYLENE GLYCOL 3350 17 G PO PACK
17.0000 g | PACK | Freq: Two times a day (BID) | ORAL | Status: DC
  Filled 2016-10-20: qty 1

## 2016-10-20 MED ORDER — ETOMIDATE 2 MG/ML IV SOLN
INTRAVENOUS | Status: AC
  Administered 2016-10-20: 20 mg
  Filled 2016-10-20: qty 10

## 2016-10-20 MED ORDER — ACETYLCYSTEINE 20 % IN SOLN
4.0000 mL | Freq: Once | RESPIRATORY_TRACT | Status: AC
  Administered 2016-10-20: 4 mL via RESPIRATORY_TRACT
  Filled 2016-10-20 (×3): qty 4

## 2016-10-20 MED ORDER — FENTANYL CITRATE (PF) 100 MCG/2ML IJ SOLN
INTRAMUSCULAR | Status: AC
  Filled 2016-10-20: qty 2

## 2016-10-20 MED ORDER — FENTANYL 2500MCG IN NS 250ML (10MCG/ML) PREMIX INFUSION
25.0000 ug/h | INTRAVENOUS | Status: DC
  Administered 2016-10-20: 100 ug/h via INTRAVENOUS
  Filled 2016-10-20: qty 250

## 2016-10-20 MED ORDER — POLYETHYLENE GLYCOL 3350 17 G PO PACK
17.0000 g | PACK | Freq: Every day | ORAL | Status: DC | PRN
  Filled 2016-10-20: qty 1

## 2016-10-20 MED ORDER — SODIUM CHLORIDE 0.9 % IV SOLN
500.0000 mg | Freq: Two times a day (BID) | INTRAVENOUS | Status: DC
  Administered 2016-10-20 – 2016-10-23 (×6): 500 mg via INTRAVENOUS
  Filled 2016-10-20 (×7): qty 500

## 2016-10-20 MED ORDER — FREE WATER
250.0000 mL | Freq: Four times a day (QID) | Status: DC
  Administered 2016-10-20: 250 mL
  Administered 2016-10-20: 150 mL
  Administered 2016-10-20 – 2016-10-22 (×7): 250 mL

## 2016-10-20 MED ORDER — VANCOMYCIN HCL IN DEXTROSE 1-5 GM/200ML-% IV SOLN
1000.0000 mg | INTRAVENOUS | Status: DC
  Administered 2016-10-20 – 2016-10-22 (×2): 1000 mg via INTRAVENOUS
  Filled 2016-10-20 (×2): qty 200

## 2016-10-20 MED ORDER — HEPARIN (PORCINE) IN NACL 100-0.45 UNIT/ML-% IJ SOLN
1400.0000 [IU]/h | INTRAMUSCULAR | Status: DC
  Administered 2016-10-20 (×2): 1600 [IU]/h via INTRAVENOUS
  Administered 2016-10-21: 1100 [IU]/h via INTRAVENOUS
  Administered 2016-10-22: 1400 [IU]/h via INTRAVENOUS
  Filled 2016-10-20 (×9): qty 250

## 2016-10-20 MED ORDER — SODIUM CHLORIDE 0.45 % IV SOLN
INTRAVENOUS | Status: DC
  Administered 2016-10-20: 23:00:00 via INTRAVENOUS
  Administered 2016-10-20: 100 mL/h via INTRAVENOUS
  Administered 2016-10-21 – 2016-10-24 (×4): via INTRAVENOUS

## 2016-10-20 MED ORDER — INSULIN REGULAR BOLUS VIA INFUSION: 12.0000 [IU] | Freq: Once | INTRAVENOUS | Status: DC

## 2016-10-20 MED ORDER — FENTANYL BOLUS VIA INFUSION
25.0000 ug | INTRAVENOUS | Status: DC | PRN
  Filled 2016-10-20: qty 25

## 2016-10-20 MED ORDER — DEXAMETHASONE SODIUM PHOSPHATE 10 MG/ML IJ SOLN
10.0000 mg | Freq: Once | INTRAMUSCULAR | Status: AC
  Administered 2016-10-20: 10 mg via INTRAVENOUS
  Filled 2016-10-20: qty 1

## 2016-10-20 MED ORDER — VITAL AF 1.2 CAL PO LIQD
1000.0000 mL | ORAL | Status: DC
  Administered 2016-10-20 – 2016-10-22 (×2): 1000 mL

## 2016-10-20 MED ORDER — INSULIN ASPART 100 UNIT/ML ~~LOC~~ SOLN
12.0000 [IU] | Freq: Once | SUBCUTANEOUS | Status: AC
  Administered 2016-10-20: 12 [IU] via INTRAVENOUS

## 2016-10-20 MED ORDER — VANCOMYCIN HCL IN DEXTROSE 1-5 GM/200ML-% IV SOLN
1000.0000 mg | INTRAVENOUS | Status: DC
  Filled 2016-10-20: qty 200

## 2016-10-20 MED ORDER — FENTANYL CITRATE (PF) 100 MCG/2ML IJ SOLN
50.0000 ug | Freq: Once | INTRAMUSCULAR | Status: AC
  Administered 2016-10-20: 50 ug via INTRAVENOUS

## 2016-10-20 NOTE — Progress Notes (Signed)
ANTICOAGULATION CONSULT NOTE - Follow Up Consult  Pharmacy Consult for Heparin Indication: History of pulmonary embolus  Allergies  Allergen Reactions  . Keflex [Cephalexin] Itching and Swelling    Lips and eyes swelled up  . Clindamycin/Lincomycin Itching  . Latex Itching  . Sulfa Antibiotics Rash    Patient Measurements: Height: 5\' 9"  (175.3 cm) Weight: 196 lb 13.9 oz (89.3 kg) IBW/kg (Calculated) : 66.2 Heparin Dosing Weight: 86 kg  Vital Signs: Temp: 97.5 F (36.4 C) (10/31 0407) Temp Source: Oral (10/31 0407) BP: 152/70 (10/31 0421) Pulse Rate: 71 (10/31 0421)  Labs:  Recent Labs  10/18/16 0420 10/19/16 0456 10/19/16 2021 10/20/16 0653  HGB 9.1* 9.6*  --  10.4*  HCT 27.4* 28.4*  --  32.2*  PLT 488* 595*  --  616*  LABPROT 27.0* 29.9*  --  22.5*  INR 2.44 2.78  --  1.95  HEPARINUNFRC  --   --  0.49 0.97*  CREATININE 3.84* 3.81* 3.76*  --     Estimated Creatinine Clearance: 17.3 mL/min (by C-G formula based on SCr of 3.76 mg/dL (H)).  Assessment: 43 yoF w/ history of PE on Xarelto PTA. Pt was reportedly noncompliant with Xarelto so transitioned to warfarin w/ heparin bridge. Given potential need for trach, switched back to heparin drip alone. Initial heparin level therapeutic at 0.49, now elevated at 0.97 on repeat morning level. No S/Sx bleeding noted, Hgb wnl, plt 616.  Goal of Therapy:  Heparin level 0.3-0.7 units/ml Monitor platelets by anticoagulation protocol: Yes   Plan:  -Hold heparin x1 hr given significant increase in level -Restart heparin at 0900 at 1600 units/hr -Follow-up 8-hr heparin level -Daily heparin level, CBC, S/Sx bleeding  Arrie Senate, PharmD PGY-1 Pharmacy Resident Pager: [phone] 10/20/2016

## 2016-10-20 NOTE — Procedures (Signed)
Bedside Bronchoscopy Procedure Note JAYMEE CELA [id number] March 06, 1951  Procedure: Bronchoscopy Indications: Diagnostic evaluation of the airways, Obtain specimens for culture and/or other diagnostic studies and Remove secretions  Procedure Details: ET Tube Size: ET Tube secured at lip (cm): Bite block in place: Yes In preparation for procedure, Patient hyper-oxygenated with 100 % FiO2 and Saline given via ETT (60 ml) Airway entered and the following bronchi were examined: RUL, RML, RLL, LUL, LLL and Bronchi.   Bronchoscope removed.  , Patient placed back on 40% FiO2 at conclusion of procedure.    Evaluation BP (!) 154/70   Pulse 73   Temp 98.1 F (36.7 C) (Oral)   Resp (!) 24   Ht 5\' 9"  (1.753 m)   Wt 196 lb 13.9 oz (89.3 kg)   SpO2 100%   BMI 29.07 kg/m  Breath Sounds:Rhonch O2 sats: stable throughout Patient's Current Condition: stable Specimens:  Sent : No apparent complications Patient did tolerate procedure well.   Ciro Backer 10/20/2016, 1:35 PM

## 2016-10-20 NOTE — Progress Notes (Signed)
Pharmacy Antibiotic Note  Joy Butler is a 65 y.o. female admitted on 10/10/2016 with HCAP/VAP. Pharmacy has been consulted for Vancomycin/Primaxin dosing. Pt completed a 10-day course of antibiotics today for Strep pneumo bacteremia; however, bronchoscopy is concerning for ongoing infection so broad-spectrum antibiotics restarted. WBC up to 24, remains afebrile, CrCl ~18 ml/min. Of note, pt has an drug allergy for cephalosporins, so primaxin chosen for now.   Plan: -Vancomycin 1000 mg IV q48h -Primaxin 500mg  IV q12h -Follow-up Cx data, clinical status, S/Sx infection, renal function -Obtain vancomycin trough and narrow antibiotic coverage as appropriate  Height: 5\' 9"  (175.3 cm) Weight: 196 lb 13.9 oz (89.3 kg) IBW/kg (Calculated) : 66.2  Temp (24hrs), Avg:97.9 F (36.6 C), Min:97.5 F (36.4 C), Max:98.9 F (37.2 C)   Recent Labs Lab 10/16/16 0400 10/17/16 0330 10/18/16 0420 10/19/16 0456 10/19/16 2021 10/20/16 0653  WBC 24.5* 22.5* 20.4* 21.4*  --  24.2*  CREATININE 3.51* 3.75* 3.84* 3.81* 3.76* 3.57*    Estimated Creatinine Clearance: 18.2 mL/min (by C-G formula based on SCr of 3.57 mg/dL (H)).    Allergies  Allergen Reactions  . Keflex [Cephalexin] Itching and Swelling    Lips and eyes swelled up  . Clindamycin/Lincomycin Itching  . Latex Itching  . Sulfa Antibiotics Rash    Antimicrobials this admission: Primaxin 10/31 >> Vanc 10/22 >> 10/24; 10/31 >> LVQ 10/22 >>10/31  Dose adjustments this admission: none  Microbiology results: 10/22 BCx: 2/2 Strep Pneumo (Sens: CTX, LVQ, Pen) 10/22 UCx: <10k insiginifcant growth 10/22 MRSA PCR: negative 10/23 Resp PCR: neg  Thank you for allowing pharmacy to be a part of this patient's care.  Einar Grad 10/20/2016 2:08 PM

## 2016-10-20 NOTE — Procedures (Signed)
Bronchoscopy Procedure Note MARRI WOJTKOWIAK [id number] 1951-04-29  Procedure: Bronchoscopy Indications: Diagnostic evaluation of the airways, Obtain specimens for culture and/or other diagnostic studies and Remove secretions  Procedure Details Consent: Risks of procedure as well as the alternatives and risks of each were explained to the (patient/caregiver).  Consent for procedure obtained. Time Out: Verified patient identification, verified procedure, site/side was marked, verified correct patient position, special equipment/implants available, medications/allergies/relevent history reviewed, required imaging and test results available.  Performed  In preparation for procedure, patient was given 100% FiO2 and bronchoscope lubricated. Sedation: Etomidate  Airway entered and the following bronchi were examined: RUL, RML, RLL, LUL, LLL and Bronchi.   Procedures performed: Brushings performed- no Bronchoscope removed.  , Patient placed back on 100% FiO2 at conclusion of procedure.    Evaluation Hemodynamic Status: BP stable throughout; O2 sats: stable throughout Patient's Current Condition: stable Specimens:  Sent purulent fluid Complications: No apparent complications Patient did tolerate procedure well.   Nikyah Lackman J. 10/20/2016  Mucous plugging, was reintubation mucous vs upper airway and assess hypoxia int needs  1. ETT 90% obstructed pus thick 2. Left main collapse full distal 3. Erythema upper division< LLL take off 4. All above suctioned clear 5. BAL pus thick , will repeat culture, new organism?? 5. mucomysts 20% 4 ml into left main and then cleared  Appears that secretions were the issue, rest again, repeat leak in am and follow secre5tion status

## 2016-10-20 NOTE — Progress Notes (Addendum)
PULMONARY / CRITICAL CARE MEDICINE   Name: Joy Butler MRN: [id number] DOB: November 25, 1951    ADMISSION DATE:  10/10/2016 CONSULTATION DATE:  10/11/16   REFERRING MD:  Dr. Reesa Chew / IMTS   CHIEF COMPLAINT:  Multi-focal PNA / Acute Respiratory Failure   HISTORY OF PRESENT ILLNESS:   [name], occasional cigarette smoking, with PMH of arthritis, cataracts, chronic back pain, migraines, depression, HLD, HTN, splenic infarct, DM II, PE (2011, on xarelto with intermittent compliance) who presented to [facility] on 10/22 with reports of lower extremity pain and shortness of breath. The patient also reported a possible syncopal episode.  She presented to the ER via EMS. Per report, she had a memory of the fall. EMS found the patient to be hypoxic with room air saturations of 80%. She does not wear home oxygen at baseline. She was placed on a nonrebreather with improvement to 90%. The patient reported development of a cough, weakness and "not feeling well" one week prior to presentation. Initial ER evaluation found her to have increased work of breathing on exam with saturations in the 70s on room air. She had a mild elevation of lactic acid at 2.84 and rectal temperature of 100.8. The patient was found to have a chest x-ray concerning for multifocal pneumonia. Initially she was not hypotensive. However, later in the morning the patient developed hypotension and increased work of breathing. ABG evaluation demonstrated worsening acidosis. Initial labs-NA 138, K3.3, creatinine 3.85 (up from 2.46), bun 58, anion gap 14, alkaline phosphatase 123, albumin 1.6, BNP 284, troponin 0.07, WBC 18.7, hemoglobin 13.1, and platelets 397.  She felt nausea prior to intubation and vomited (the patient was able to notify staff, sat upright and secretions cleared via suction). Due to bilateral opacities, hypoxic respiratory failure and developing hypotension the decision was made to intubate the patient for airway protection.  PCCM  called for ICU admission.  SUBJECTIVE:  No events overnight. Afebrile. Has been able to move in bed and to chair. Seems disoriented. Has not had a BM in last couple of days but abdomen soft and +BS. Precedex at 1.2.   VITAL SIGNS: BP (!) 152/70   Pulse 71   Temp 97.5 F (36.4 C) (Oral)   Resp (!) 25   Ht 5\' 9"  (1.753 m)   Wt 196 lb 13.9 oz (89.3 kg)   SpO2 100%   BMI 29.07 kg/m   HEMODYNAMICS:    VENTILATOR SETTINGS: Vent Mode: PRVC FiO2 (%):  [40 %] 40 % Set Rate:  [18 bmp] 18 bmp Vt Set:  [500 mL-530 mL] 530 mL PEEP:  [5 cmH20] 5 cmH20 Pressure Support:  [10 cmH20] 10 cmH20 Plateau Pressure:  [15 cmH20-17 cmH20] 16 cmH20  INTAKE / OUTPUT: I/O last 3 completed shifts: In: 5135 [I.V.:2724; NG/GT:2161; IV Piggyback:250] Out: 2360 [Urine:2360]  PHYSICAL EXAMINATION: General:  Chronically ill appearing female, NAD, intubated Neuro: Eyes opens to voice, nods answers to questions and following commands; indicating she wants food and water and not seeming to understand why she can't have these  HEENT:  MM pink/dry, no jvd, ETT in place Cardiovascular:  s1s2 rrr, no m/r/g Lungs: CTAB, improved  Abdomen:  Obese/soft, no TTP, bsx4 active  Musculoskeletal:  No acute deformities, BLE symmetrical  Skin:  Warm/dry, no edema  LABS:  BMET  Recent Labs Lab 10/19/16 0456 10/19/16 2021 10/20/16 0653  NA 148* 147* 147*  K 4.0 4.0 3.5  CL 113* 112* 113*  CO2 22 21* 22  BUN  158* 168* 171*  CREATININE 3.81* 3.76* 3.57*  GLUCOSE 323* 304* 399*    Electrolytes  Recent Labs Lab 10/18/16 0420 10/19/16 0456 10/19/16 2021 10/20/16 0653  CALCIUM 8.4* 8.6* 8.9 8.8*  MG 2.1 2.4  --  2.3  PHOS 4.6 6.0*  --  5.2*   CBC  Recent Labs Lab 10/18/16 0420 10/19/16 0456 10/20/16 0653  WBC 20.4* 21.4* 24.2*  HGB 9.1* 9.6* 10.4*  HCT 27.4* 28.4* 32.2*  PLT 488* 595* 616*   Coag's  Recent Labs Lab 10/18/16 0420 10/19/16 0456 10/20/16 0653  INR 2.44 2.78 1.95    Sepsis Markers No results for input(s): LATICACIDVEN, PROCALCITON, O2SATVEN in the last 168 hours. ABG  Recent Labs Lab 10/16/16 0322 10/18/16 0440 10/18/16 0455  PHART 7.441 TEST WILL BE CREDITED 7.507*  PCO2ART 33.2 TEST WILL BE CREDITED 32.2  PO2ART 82.0* TEST WILL BE CREDITED 82.7*   Liver Enzymes  Recent Labs Lab 10/14/16 0448  ALBUMIN 1.1*   Cardiac Enzymes No results for input(s): TROPONINI, PROBNP in the last 168 hours. Glucose  Recent Labs Lab 10/19/16 0754 10/19/16 1139 10/19/16 1603 10/19/16 2028 10/20/16 0019 10/20/16 0408  GLUCAP 289* 240* 271* 275* 321* 351*   Imaging No results found. STUDIES:  ECHO 10/22: LVEF 40-45% with mild LDH and G1DD (worse compared to EF of 50-55% 08/2012) CT Chest 10/22 >> extensive bilateral consolidative changes consistent with multifocal pneumonia, mild cardiomegaly with multivessel coronary disease  CULTURES: Influenza 10/22: negative BCx2 10/22 >> strep pneumo UA 10/22: proteinuria, small hgb, moderate bilirubin, negative nitrites and leukocytes; rbc 0-5 on add-on UC 10/22: < 10,000 colonies Strep pneumo urinary Ag 10/22: positive Respiratory panel 10/22: Negative  ANTIBIOTICS: Vanco 10/22 >> 10/24 Levaquin 10/22 >>   SIGNIFICANT EVENTS: 10/22  Admit per IMTS with PNA, failed BiPAP & required intubation  LINES/TUBES: ETT 10/22 >> 10/29 PIV x 2>> Foley 10/22 >> ETT 10/29 >>  DISCUSSION: [name] with PMH of PE (non-compliant with xarelto), HTN, CKD, HLD admitted 10/22 per IMTS with multifocal PNA.  Developed worsening respiratory failure requiring intubation in ER. Found to have Strep pneumo bacteremia. Extubated 10/29 but reintubated for significant stridor; steroids started.   ASSESSMENT / PLAN:  PULMONARY A: Acute Hypoxic Respiratory Failure - in the setting of multi-focal PNA  Multi-focal PNA - R>L opacities  Hx PE - on Xarelto, non-compliant with anticoagulation  Strep pneumo positive with  urine Ag testing Net neg 1.0 P:   Hold further diureses at this point Wean off vent as able.  IS per RT protocol Wean FiO2 for sats > 92% Trend CXR - stable See ID section S/p 7 doses solumedrol Consider trach Duonebs q6h  CARDIOVASCULAR A:  Septic Shock - in setting of multifocal PNA Syncopal Episode - prior to admit Hx HTN, HLD P:  ICU monitoring of hemodynamics  TFs D/Ced pressors ECHO without RV abnormality (hx non-compliance with anticoagulation) Continue home norvasc Lopressor 2.5 mg q3h prn  RENAL A:   Acute on Chronic Kidney Injury  - baseline sr cr ~ 2.4 Hypokalemia - stable Pseudo-hypocalcemia - corrects normal (8.7) for hypoalbuminemia At least CKD IIIB given labs earlier this year Renal function worse but stable Free water deficit of 2.5 L P:   Trend BMP/UOP  KVO IVF given hyperchloremia Hold further lasix at this point Replace electrolytes as indicated. Start free water flushes If UOP drops then will likely need to call renal for ? Of dialysis, no acute indications right now.  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, non-compliant coumadin induced coagulapthy P:  Trend CBC Dc coumadin 10/30 S/p K May need trach Hep drip PT/INR pending  INFECTIOUS A:   Multifocal PNA  Leukocytosis not resolved --> 19.8 > 18.6 > 21.4 > 24.2 P:   Follow pan cultures  Abx as above, note allergies (keflex - facial swelling, clinda, sulfa) Trend PCT down Vanc off 10/24 Day 10 levaquin, d/c today  ENDOCRINE A:   DM - last hgb A1c 6.2 on 07/06/16   CBGs low 300s P:   SSI  Increase lantus to 20 U and TF coverage started with steroids CBGs q4h Steroids d/c'ed after 4 doses Changed D5W to free water flushes  NEUROLOGIC A:   Chronic Back Pain Migraines   P:   RASS  goal: 0 to -1  D/C versed Continue precedex and fentanyl with re-intubation  FAMILY  - Updates: No family at bedside. Updated by phone 10/29.  Olene Floss, MD [facility], PGY-2  STAFF NOTE: I, Merrie Roof, MD FACP have personally reviewed patient's available data, including medical history, events of note, physical examination and test results as part of my evaluation. I have discussed with resident/NP and other care providers such as pharmacist, RN and RRT. In addition, I personally evaluated patient and elicited key findings of: awake, fc well, coarse but improved BS, no edema, s/p steroids for upper airway however d/w Dr Nelda Marseille felt secretions were cause and cords not concerning, wean cpap 5 ps 5, goal 30 min , assess leak testing, appear strong and cough strong, stop dates for abx, BM had, neg balance obtained, continued NA for correction, hold lasix now, glu elevated = change d5w to 1/2 NS, dc roids, dose 12 units IV reg insulin x 1 and follow glu in 1 hr, re assess if successful, if not will need drip, she looked hemoconcatreted, increase fluids back, wean precedex The patient is critically ill with multiple organ systems failure and requires high complexity decision making for assessment and support, frequent evaluation and titration of therapies, application of advanced monitoring technologies and extensive interpretation of multiple databases.   Critical Care Time devoted to patient care services described in this note is35  Minutes. This time reflects time of care of this signee: Merrie Roof, MD FACP. This critical care time does not reflect procedure time, or teaching time or supervisory time of PA/NP/Med student/Med Resident etc but could involve care discussion time. Rest per NP/medical resident whose note is outlined above and that I agree with   Lavon Paganini. Titus Mould, MD, [place] Pgr: [facility] 10/20/2016 10:32  AM

## 2016-10-20 NOTE — Progress Notes (Signed)
ANTICOAGULATION CONSULT NOTE - Follow Up Consult  Pharmacy Consult for Heparin Indication: History of pulmonary embolus  Allergies  Allergen Reactions  . Keflex [Cephalexin] Itching and Swelling    Lips and eyes swelled up  . Clindamycin/Lincomycin Itching  . Latex Itching  . Sulfa Antibiotics Rash    Patient Measurements: Height: 5\' 9"  (175.3 cm) Weight: 196 lb 13.9 oz (89.3 kg) IBW/kg (Calculated) : 66.2 Heparin Dosing Weight: 86 kg  Vital Signs: Temp: 97.6 F (36.4 C) (10/31 1700) Temp Source: Oral (10/31 1700) BP: 166/79 (10/31 1900) Pulse Rate: 78 (10/31 1900)  Labs:  Recent Labs  10/18/16 0420 10/19/16 0456 10/19/16 2021 10/20/16 0653 10/20/16 1843  HGB 9.1* 9.6*  --  10.4*  --   HCT 27.4* 28.4*  --  32.2*  --   PLT 488* 595*  --  616*  --   LABPROT 27.0* 29.9*  --  22.5*  --   INR 2.44 2.78  --  1.95  --   HEPARINUNFRC  --   --  0.49 0.97* 0.72*  CREATININE 3.84* 3.81* 3.76* 3.57*  --     Estimated Creatinine Clearance: 18.2 mL/min (by C-G formula based on SCr of 3.57 mg/dL (H)).  Assessment: 36 yoF w/ history of PE on Xarelto PTA. Pt was reportedly noncompliant with Xarelto so transitioned to warfarin w/ heparin bridge. Given potential need for trach, switched back to heparin drip alone.    Repeat heparin level slightly elevated at 0.72 after holding x1 hour and decreasing to 1600 units/hr.  No S/Sx bleeding noted - confirmed with RN.  H/H low-stable. Platelets elevated.   Goal of Therapy:  Heparin level 0.3-0.7 units/ml Monitor platelets by anticoagulation protocol: Yes   Plan:  -Decrease heparin to 1500 units/hr -Follow-up AM level -Daily heparin level, CBC, S/Sx bleeding  Sloan Leiter, PharmD, BCPS Clinical Pharmacist [phone] 10/20/2016

## 2016-10-20 NOTE — Progress Notes (Signed)
Tube feeds held and stomach emptied in preparation for bronch.  550 cc removed from stomach. After bronch, orders given to restart TF at 30/hr.

## 2016-10-20 NOTE — Care Management Important Message (Signed)
Important Message  Patient Details  Name: Joy Butler MRN: [id number] Date of Birth: 1951/02/27   Medicare Important Message Given:  Yes    [name] May 10/20/2016, 11:34 AM

## 2016-10-20 NOTE — Progress Notes (Signed)
PT Cancellation Note  Patient Details Name: Joy Butler MRN: [id number] DOB: 05-03-1951   Cancelled Treatment:    Reason Eval/Treat Not Completed: Medical issues which prohibited therapy. Pt being prepped for a bronch and failed attempted extubation. PT to return as able once medically appropriate.   Kingsley Callander 10/20/2016, 1:00 PM   Kittie Plater, PT, DPT Pager #: [phone] Office #: [phone]

## 2016-10-20 NOTE — Progress Notes (Signed)
eLink Physician-Brief Progress Note Patient Name: Joy Butler DOB: [birth year] MRN: [id number]   Date of Service  10/20/2016  HPI/Events of Note    eICU Interventions  Fentanyl infusion     Intervention Category Major Interventions: Delirium, psychosis, severe agitation - evaluation and management  [name] 10/20/2016, 8:37 PM

## 2016-10-21 LAB — HEPARIN LEVEL (UNFRACTIONATED)
HEPARIN UNFRACTIONATED: 0.81 [IU]/mL — AB (ref 0.30–0.70)
HEPARIN UNFRACTIONATED: 0.36 [IU]/mL (ref 0.30–0.70)
Heparin Unfractionated: 0.9 IU/mL — ABNORMAL HIGH (ref 0.30–0.70)

## 2016-10-21 LAB — GLUCOSE, CAPILLARY
GLUCOSE-CAPILLARY: 162 mg/dL — AB (ref 65–99)
GLUCOSE-CAPILLARY: 181 mg/dL — AB (ref 65–99)
Glucose-Capillary: 194 mg/dL — ABNORMAL HIGH (ref 65–99)
Glucose-Capillary: 198 mg/dL — ABNORMAL HIGH (ref 65–99)
Glucose-Capillary: 255 mg/dL — ABNORMAL HIGH (ref 65–99)
Glucose-Capillary: 288 mg/dL — ABNORMAL HIGH (ref 65–99)
Glucose-Capillary: 318 mg/dL — ABNORMAL HIGH (ref 65–99)

## 2016-10-21 LAB — CBC
HCT: 32.5 % — ABNORMAL LOW (ref 36.0–46.0)
Hemoglobin: 10.8 g/dL — ABNORMAL LOW (ref 12.0–15.0)
MCH: 27.6 pg (ref 26.0–34.0)
MCHC: 33.2 g/dL (ref 30.0–36.0)
MCV: 83.1 fL (ref 78.0–100.0)
Platelets: 596 10*3/uL — ABNORMAL HIGH (ref 150–400)
RBC: 3.91 MIL/uL (ref 3.87–5.11)
RDW: 18 % — ABNORMAL HIGH (ref 11.5–15.5)
WBC: 21.1 10*3/uL — ABNORMAL HIGH (ref 4.0–10.5)

## 2016-10-21 LAB — PROTIME-INR
INR: 2.07
Prothrombin Time: 23.6 seconds — ABNORMAL HIGH (ref 11.4–15.2)

## 2016-10-21 MED ORDER — INSULIN ASPART 100 UNIT/ML ~~LOC~~ SOLN
0.0000 [IU] | Freq: Three times a day (TID) | SUBCUTANEOUS | Status: DC
  Administered 2016-10-21 (×2): 2 [IU] via SUBCUTANEOUS

## 2016-10-21 MED ORDER — PROPOFOL 1000 MG/100ML IV EMUL: 5.0000 ug/kg/min | INTRAVENOUS | Status: DC

## 2016-10-21 MED ORDER — INSULIN ASPART 100 UNIT/ML ~~LOC~~ SOLN
0.0000 [IU] | Freq: Every day | SUBCUTANEOUS | Status: DC
  Administered 2016-10-21: 0 [IU] via SUBCUTANEOUS
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
## 2016-10-21 NOTE — Progress Notes (Signed)
ANTICOAGULATION CONSULT NOTE - Follow Up Consult  Pharmacy Consult for Heparin Indication: History of pulmonary embolus  Allergies  Allergen Reactions  . Keflex [Cephalexin] Itching and Swelling    Lips and eyes swelled up  . Clindamycin/Lincomycin Itching  . Latex Itching  . Sulfa Antibiotics Rash    Patient Measurements: Height: 5\' 9"  (175.3 cm) Weight: 200 lb 6.4 oz (90.9 kg) IBW/kg (Calculated) : 66.2 Heparin Dosing Weight: 86 kg  Vital Signs: Temp: 97.3 F (36.3 C) (11/01 1934) Temp Source: Oral (11/01 1934) BP: 125/76 (11/01 1835) Pulse Rate: 69 (11/01 2019)  Labs:  Recent Labs  10/19/16 0456  10/19/16 2021 10/20/16 0653  10/20/16 2254 10/21/16 0228 10/21/16 1150 10/21/16 2139  HGB 9.6*  --   --  10.4*  --   --  10.8*  --   --   HCT 28.4*  --   --  32.2*  --   --  32.5*  --   --   PLT 595*  --   --  616*  --   --  596*  --   --   LABPROT 29.9*  --   --  22.5*  --   --  23.6*  --   --   INR 2.78  --   --  1.95  --   --  2.07  --   --   HEPARINUNFRC  --   < > 0.49 0.97*  < >  --  0.81* 0.90* 0.36  CREATININE 3.81*  --  3.76* 3.57*  --  3.30*  --   --   --   < > = values in this interval not displayed.  Estimated Creatinine Clearance: 19.9 mL/min (by C-G formula based on SCr of 3.3 mg/dL (H)).  Assessment: 65 yo female w/ history of PE on Xarelto PTA. Pt was reportedly noncompliant with Xarelto so transitioned to warfarin w/ heparin bridge on admission. Given potential need for trach, warfarin discontinued and heparin drip continued alone. Heparin level previously elevated, now down to 0.36 in goal.  Goal of Therapy:  Heparin level 0.3-0.7 units/ml Monitor platelets by anticoagulation protocol: Yes   Plan:  -Continue heparin at 1100 units/hr -Daily heparin level and CBC  Sherif Millspaugh S. Alford Highland, PharmD, [facility] Clinical Staff Pharmacist Pager [phone]  10/21/16 10:10 PM

## 2016-10-21 NOTE — Progress Notes (Signed)
Rehab Admissions Coordinator Note:  Patient was screened by Retta Diones for appropriateness for an Inpatient Acute Rehab Consult.  At this time, patient is on the vent.  Once off the vent, then can consider inpatient rehab consult if needed.  Retta Diones 10/21/2016, 10:38 AM  I can be reached at [phone].

## 2016-10-21 NOTE — Progress Notes (Signed)
ANTICOAGULATION CONSULT NOTE - Follow Up Consult  Pharmacy Consult for Heparin Indication: History of pulmonary embolus  Allergies  Allergen Reactions  . Keflex [Cephalexin] Itching and Swelling    Lips and eyes swelled up  . Clindamycin/Lincomycin Itching  . Latex Itching  . Sulfa Antibiotics Rash    Patient Measurements: Height: 5\' 9"  (175.3 cm) Weight: 200 lb 6.4 oz (90.9 kg) IBW/kg (Calculated) : 66.2 Heparin Dosing Weight: 86 kg  Vital Signs: Temp: 97.2 F (36.2 C) (11/01 1203) Temp Source: Oral (11/01 1203) BP: 159/93 (11/01 1130) Pulse Rate: 73 (11/01 1100)  Labs:  Recent Labs  10/19/16 0456  10/19/16 2021 10/20/16 0653 10/20/16 1843 10/20/16 2254 10/21/16 0228 10/21/16 1150  HGB 9.6*  --   --  10.4*  --   --  10.8*  --   HCT 28.4*  --   --  32.2*  --   --  32.5*  --   PLT 595*  --   --  616*  --   --  596*  --   LABPROT 29.9*  --   --  22.5*  --   --  23.6*  --   INR 2.78  --   --  1.95  --   --  2.07  --   HEPARINUNFRC  --   < > 0.49 0.97* 0.72*  --  0.81* 0.90*  CREATININE 3.81*  --  3.76* 3.57*  --  3.30*  --   --   < > = values in this interval not displayed.  Estimated Creatinine Clearance: 19.9 mL/min (by C-G formula based on SCr of 3.3 mg/dL (H)).  Assessment: 65 yo female w/ history of PE on Xarelto PTA. Pt was reportedly noncompliant with Xarelto so transitioned to warfarin w/ heparin bridge on admission. Given potential need for trach, warfarin discontinued and heparin drip continued alone. Heparin level remains elevated (0.9) on heparin gtt at 1300 units/hr. Labs drawn in arm opposite heparin gtt. CBC stable. No s/s bleeding noted.   Goal of Therapy:  Heparin level 0.3-0.7 units/ml Monitor platelets by anticoagulation protocol: Yes   Plan:  -Decrease heparin to 1100 units/hr -F/u 8 hr heparin level -Daily heparin level and CBC -Monitor for s/s bleeding   Argie Ramming, PharmD Pharmacy Resident  Pager [phone] 10/21/16 12:47  PM

## 2016-10-21 NOTE — Progress Notes (Addendum)
RT, RN and PT walked patient on ventilator. No complications. Patient tolerated well. Patient vital signs stable throughout. Patient sitting in chair. RT will continue to monitor.

## 2016-10-21 NOTE — Progress Notes (Signed)
ANTICOAGULATION CONSULT NOTE - Follow Up Consult  Pharmacy Consult for Heparin Indication: History of pulmonary embolus  Allergies  Allergen Reactions  . Keflex [Cephalexin] Itching and Swelling    Lips and eyes swelled up  . Clindamycin/Lincomycin Itching  . Latex Itching  . Sulfa Antibiotics Rash    Patient Measurements: Height: 5\' 9"  (175.3 cm) Weight: 196 lb 13.9 oz (89.3 kg) IBW/kg (Calculated) : 66.2 Heparin Dosing Weight: 86 kg  Vital Signs: Temp: 97.3 F (36.3 C) (11/01 0013) Temp Source: Oral (11/01 0013) BP: 143/72 (11/01 0200) Pulse Rate: 62 (11/01 0200)  Labs:  Recent Labs  10/19/16 0456  10/19/16 2021 10/20/16 0653 10/20/16 1843 10/20/16 2254 10/21/16 0228  HGB 9.6*  --   --  10.4*  --   --  10.8*  HCT 28.4*  --   --  32.2*  --   --  32.5*  PLT 595*  --   --  616*  --   --  596*  LABPROT 29.9*  --   --  22.5*  --   --  23.6*  INR 2.78  --   --  1.95  --   --  2.07  HEPARINUNFRC  --   < > 0.49 0.97* 0.72*  --  0.81*  CREATININE 3.81*  --  3.76* 3.57*  --  3.30*  --   < > = values in this interval not displayed.  Estimated Creatinine Clearance: 19.7 mL/min (by C-G formula based on SCr of 3.3 mg/dL (H)).  Assessment: 64 yoF w/ history of PE on Xarelto PTA. Pt was reportedly noncompliant with Xarelto so transitioned to warfarin w/ heparin bridge. Given potential need for trach, switched back to heparin drip alone. Heparin level remains elevated (0.81) on gtt at 1500 units/hr. CBC stable.  Goal of Therapy:  Heparin level 0.3-0.7 units/ml Monitor platelets by anticoagulation protocol: Yes   Plan:  -Decrease heparin to 1300 units/hr -F/u 8 hr heparin level  Sherlon Handing, PharmD, BCPS Clinical pharmacist, pager [phone] 10/21/2016 3:19 AM

## 2016-10-21 NOTE — Evaluation (Addendum)
Physical Therapy Evaluation Patient Details Name: Joy Butler MRN: [id number] DOB: December 22, 1950 Today's Date: 10/21/2016   History of Present Illness  65 yo admitted with CAP with VDRF 10/22-10/29 with reintubation same day, bronchoscopy 10/31. PMhx: arthritis, back pain, DM, neuropathy, HTN, falls, PE  Clinical Impression  Pt pleasant and moving well but does state anxiety about moving on the vent. Pt reassured and encouraged throughout with pt able to walk 8' today. RT present throughout to manage vent. Pt with decreased strength, transfers, gait and function who will benefit from acute therapy to maximize mobility and independence. Recommend OOB daily with nursing assist as well as pt encouraged for HEP.   Pt on CPAP 40% before and after session ETT 23 cm maintained  HR 63-72 sats 100%    Follow Up Recommendations CIR;Supervision/Assistance - 24 hour    Equipment Recommendations  None recommended by PT    Recommendations for Other Services OT consult;Rehab consult     Precautions / Restrictions Precautions Precautions: Fall Precaution Comments: vent, panda Restrictions Weight Bearing Restrictions: No      Mobility  Bed Mobility Overal bed mobility: Needs Assistance Bed Mobility: Supine to Sit     Supine to sit: Min assist     General bed mobility comments: cues for sequence with increased time and assist to elevate trunk from surface  Transfers Overall transfer level: Needs assistance   Transfers: Sit to/from Stand Sit to Stand: Mod assist;+2 physical assistance;+2 safety/equipment;From elevated surface         General transfer comment: pt unable to rise from bed in lowest position and with elevated able to rise with assist for anterior translation and powering up  Ambulation/Gait Ambulation/Gait assistance: +2 safety/equipment;Mod assist Ambulation Distance (Feet): 8 Feet Assistive device: Rolling walker (2 wheeled) Gait Pattern/deviations:  Step-through pattern;Decreased stride length;Trunk flexed   Gait velocity interpretation: Below normal speed for age/gender General Gait Details: cues for posture, position in RW, safety with RT present to control vent and nurse for chair. 2 person assist with gait for balance, RW position and safety  Stairs            Wheelchair Mobility    Modified Rankin (Stroke Patients Only)       Balance Overall balance assessment: Needs assistance   Sitting balance-Leahy Scale: Fair       Standing balance-Leahy Scale: Poor                               Pertinent Vitals/Pain Pain Assessment: No/denies pain    Home Living Family/patient expects to be discharged to:: Private residence Living Arrangements: Spouse/significant other Available Help at Discharge: Family;Available 24 hours/day Type of Home: House Home Access: Stairs to enter Entrance Stairs-Rails: Right;Left;Can reach both Entrance Stairs-Number of Steps: 3 Home Layout: One level Home Equipment: Cane - single point;Shower seat;Walker - 2 wheels;Bedside commode      Prior Function Level of Independence: Needs assistance   Gait / Transfers Assistance Needed: ambulated with RW for shorter distances  ADL's / Homemaking Assistance Needed: assist for housekeeping, assist for bathing and at times assist for dressing lower body  Comments: using RW, multi falls. State     Hand Dominance   Dominant Hand: Right    Extremity/Trunk Assessment   Upper Extremity Assessment: Generalized weakness           Lower Extremity Assessment: Generalized weakness      Cervical / Trunk Assessment: Kyphotic  Communication   Communication: Other (comment) (ETT vent)  Cognition Arousal/Alertness: Awake/alert Behavior During Therapy: WFL for tasks assessed/performed Overall Cognitive Status: Difficult to assess                      General Comments      Exercises General Exercises - Lower  Extremity Long Arc Quad: AROM;Both;10 reps;Seated   Assessment/Plan    PT Assessment Patient needs continued PT services  PT Problem List Decreased strength;Decreased mobility;Decreased activity tolerance;Cardiopulmonary status limiting activity;Decreased balance;Decreased knowledge of use of DME          PT Treatment Interventions Gait training;Therapeutic exercise;Stair training;Balance training;Functional mobility training;Patient/family education;Therapeutic activities;DME instruction    PT Goals (Current goals can be found in the Care Plan section)  Acute Rehab PT Goals Patient Stated Goal: return home PT Goal Formulation: With patient Time For Goal Achievement: 11/04/16 Potential to Achieve Goals: Good    Frequency Min 3X/week   Barriers to discharge        Co-evaluation               End of Session Equipment Utilized During Treatment: Gait belt Activity Tolerance: Patient tolerated treatment well Patient left: in chair;with call bell/phone within reach;with nursing/sitter in room;with chair alarm set Nurse Communication: Mobility status         Time: [id number] PT Time Calculation (min) (ACUTE ONLY): 26 min   Charges:   PT Evaluation $PT Eval High Complexity: 1 Procedure     PT G CodesMelford Aase 10/21/2016, 10:20 AM  Elwyn Reach, [place]

## 2016-10-21 NOTE — Progress Notes (Signed)
RT note-ETT found to be 2 cm out, cuff deflated and ETT placed to to 24cm. In the process of deflating the cuff, patient did have a cuff leak, RN notified.

## 2016-10-21 NOTE — Progress Notes (Signed)
PULMONARY / CRITICAL CARE MEDICINE   Name: Joy Butler MRN: [id number] DOB: [birth year]    ADMISSION DATE:  10/10/2016 CONSULTATION DATE:  10/11/16   REFERRING MD:  Dr. Reesa Chew / IMTS   CHIEF COMPLAINT:  Multi-focal PNA / Acute Respiratory Failure   Brief:  [name], occasional cigarette smoking, with PMH of arthritis, cataracts, chronic back pain, migraines, depression, HLD, HTN, splenic infarct, DM II, PE (2011, on xarelto with intermittent compliance) who presented to [facility] on 10/22 with reports of lower extremity pain and shortness of breath. The patient also reported a possible syncopal episode.  She presented to the ER via EMS. Per report, she had a memory of the fall. EMS found the patient to be hypoxic with room air saturations of 80%. She does not wear home oxygen at baseline. She was placed on a nonrebreather with improvement to 90%. The patient reported development of a cough, weakness and "not feeling well" one week prior to presentation. Initial ER evaluation found her to have increased work of breathing on exam with saturations in the 70s on room air. She had a mild elevation of lactic acid at 2.84 and rectal temperature of 100.8. The patient was found to have a chest x-ray concerning for multifocal pneumonia. Initially she was not hypotensive. However, later in the morning the patient developed hypotension and increased work of breathing. ABG evaluation demonstrated worsening acidosis. Initial labs-NA 138, K3.3, creatinine 3.85 (up from 2.46), bun 58, anion gap 14, alkaline phosphatase 123, albumin 1.6, BNP 284, troponin 0.07, WBC 18.7, hemoglobin 13.1, and platelets 397.  She felt nausea prior to intubation and vomited (the patient was able to notify staff, sat upright and secretions cleared via suction). Due to bilateral opacities, hypoxic respiratory failure and developing hypotension the decision was made to intubate the patient for airway protection.  PCCM called for ICU  admission.  SUBJECTIVE:  Patient sitting in the chair with ETT in place. She is without complaint except for thirst. She is asking for extubation and water. She walked today with the help of RT and RN. She is on pressure support. She is still on tube feed.  RT asking if we can CT neck for her stridor.  Patient had bronchoscopy concerning for ongoing infection and started on Primaxin due to Cephalosporins allergy.   VITAL SIGNS: BP (!) 147/78   Pulse (!) 55   Temp 97.2 F (36.2 C) (Oral)   Resp 18   Ht 5\' 9"  (1.753 m)   Wt 90.9 kg (200 lb 6.4 oz)   SpO2 100%   BMI 29.59 kg/m   HEMODYNAMICS:  Stable  VENTILATOR SETTINGS: Vent Mode: PSV;CPAP FiO2 (%):  [40 %] 40 % Set Rate:  [18 bmp] 18 bmp Vt Set:  [530 mL] 530 mL PEEP:  [5 cmH20] 5 cmH20 Pressure Support:  [5 cmH20-8 cmH20] 8 cmH20 Plateau Pressure:  [14 cmH20-18 cmH20] 14 cmH20  INTAKE / OUTPUT: I/O last 3 completed shifts: In: 6501.9 [I.V.:4211.9; NG/GT:1840; IV Piggyback:450] Out: 82 [Urine:3650; Emesis/NG output:550]  PHYSICAL EXAMINATION: General:  Chronically ill appearing female, NAD, intubated, Sitting in the chair Neuro: Eyes opens to voice, nods answers to questions and following commands; indicating she wants food and water and not seeming to understand why she can't have these  HEENT:  MM pink/dry, no jvd, ETT in place Cardiovascular:  s1s2 rrr, no m/r/g Lungs: CTAB, improved  Abdomen:  Obese/soft, no TTP, bsx4 active  Musculoskeletal:  No acute deformities, BLE symmetrical  Skin:  Warm/dry, no edema  LABS:  BMET  Recent Labs Lab 10/19/16 2021 10/20/16 0653 10/20/16 2254  NA 147* 147* 145  K 4.0 3.5 4.1  CL 112* 113* 113*  CO2 21* 22 18*  BUN 168* 171* 159*  CREATININE 3.76* 3.57* 3.30*  GLUCOSE 304* 399* 343*    Electrolytes  Recent Labs Lab 10/18/16 0420 10/19/16 0456 10/19/16 2021 10/20/16 0653 10/20/16 2254  CALCIUM 8.4* 8.6* 8.9 8.8* 8.6*  MG 2.1 2.4  --  2.3  --   PHOS 4.6  6.0*  --  5.2*  --    CBC  Recent Labs Lab 10/19/16 0456 10/20/16 0653 10/21/16 0228  WBC 21.4* 24.2* 21.1*  HGB 9.6* 10.4* 10.8*  HCT 28.4* 32.2* 32.5*  PLT 595* 616* 596*   Coag's  Recent Labs Lab 10/19/16 0456 10/20/16 0653 10/21/16 0228  INR 2.78 1.95 2.07   Sepsis Markers No results for input(s): LATICACIDVEN, PROCALCITON, O2SATVEN in the last 168 hours. ABG  Recent Labs Lab 10/16/16 0322 10/18/16 0440 10/18/16 0455  PHART 7.441 TEST WILL BE CREDITED 7.507*  PCO2ART 33.2 TEST WILL BE CREDITED 32.2  PO2ART 82.0* TEST WILL BE CREDITED 82.7*   Liver Enzymes No results for input(s): AST, ALT, ALKPHOS, BILITOT, ALBUMIN in the last 168 hours. Cardiac Enzymes No results for input(s): TROPONINI, PROBNP in the last 168 hours. Glucose  Recent Labs Lab 10/20/16 1155 10/20/16 1600 10/20/16 1953 10/21/16 0011 10/21/16 0352 10/21/16 0736  GLUCAP 250* 230* 367* 318* 288* 255*   Imaging No results found. STUDIES:  ECHO 10/22: LVEF 40-45% with mild LDH and G1DD (worse compared to EF of 50-55% 08/2012) CT Chest 10/22 >> extensive bilateral consolidative changes consistent with multifocal pneumonia, mild cardiomegaly with multivessel coronary disease  CULTURES: Influenza 10/22: negative BCx2 10/22 >> strep pneumo UA 10/22: proteinuria, small hgb, moderate bilirubin, negative nitrites and leukocytes; rbc 0-5 on add-on UC 10/22: < 10,000 colonies Strep pneumo urinary Ag 10/22: positive Respiratory panel 10/22: Negative  ANTIBIOTICS: Vanco 10/22 >> 10/24, 10/31>> Levaquin 10/22 >> 10/24 PRIMAXIN 10/31>>  SIGNIFICANT EVENTS: 10/22  Admit per IMTS with PNA, failed BiPAP & required intubation  LINES/TUBES: ETT 10/22 >> 10/29>Stridor>10/29>> PIV x 2>> Foley 10/22 >>  DISCUSSION: [name] with PMH of PE (non-compliant with xarelto), HTN, CKD, HLD admitted 10/22 per IMTS with multifocal PNA.  Developed worsening respiratory failure requiring intubation in ER.  Found to have Strep pneumo bacteremia. Extubated 10/29 but reintubated for significant stridor; steroids started.   ASSESSMENT / PLAN:  PULMONARY A: Acute Hypoxic Respiratory Failure - in the setting of multi-focal PNA  Multi-focal PNA - R>L opacities  Hx PE - on Xarelto, non-compliant with anticoagulation  Strep pneumo positive with urine Ag testing Net neg 1.0 P:   Hold further diureses at this point Wean off vent as able.  IS per RT protocol Wean FiO2 for sats > 92% Trend CXR - stable See ID section S/p 7 doses solumedrol Consider trach Duonebs q6h  CARDIOVASCULAR A:  Septic Shock - in setting of multifocal PNA Syncopal Episode - prior to admit Hx HTN, HLD P:  ICU monitoring of hemodynamics  TFs D/Ced pressors ECHO without RV abnormality (hx non-compliance with anticoagulation) Continue home norvasc Lopressor 2.5 mg q3h prn  RENAL A:   Acute on Chronic Kidney Injury  - baseline sr cr ~ 2.4 Hypokalemia - stable Pseudo-hypocalcemia - corrects normal (8.7) for hypoalbuminemia At least CKD IIIB given labs earlier this year Renal function worse but stable  Free water deficit of 2.5 L P:   Trend BMP/UOP  KVO IVF given hyperchloremia Hold further lasix at this point Replace electrolytes as indicated. If UOP drops then will likely need to call renal for ? Of dialysis, no acute indications right now. 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 S/p K P:  Trend CBC Dcd coumadin 10/30 Hep drip PT/INR pending  INFECTIOUS A:   Multifocal PNA  Leukocytosis not resolved --> 19.8 > 18.6 > 21.4 > 24.2>21.1 P:   Follow pan cultures  Abx as above, note allergies (keflex - facial swelling, clinda, sulfa) Trend PCT down 47 on  10/24 Vanc 10/22>>10/24, 10/31>> Levaquin 10/22>24 PRIMAXIN 10/31>>  ENDOCRINE A:   DM - last hgb A1c 6.2 on 07/06/16   CBGs low 300s P:   SSI  Novolog 4u q4h Lantus 20u and TF coverage started with steroids CBGs q4h Steroids d/c'ed after 4 doses  NEUROLOGIC A:   Chronic Back Pain Migraines   P:   RASS goal: 0 to -1  D/C versed and precedex On fentanyl with re-intubation  FAMILY  - Updates: No family at bedside. Will update after round.   Wendee Beavers, MD. [place], PGY-2  10/21/2016 9:20 AM

## 2016-10-22 DIAGNOSIS — J13 Pneumonia due to Streptococcus pneumoniae: Secondary | ICD-10-CM

## 2016-10-22 DIAGNOSIS — L8990 Pressure ulcer of unspecified site, unspecified stage: Secondary | ICD-10-CM | POA: Insufficient documentation

## 2016-10-22 LAB — GLUCOSE, CAPILLARY
GLUCOSE-CAPILLARY: 247 mg/dL — AB (ref 65–99)
GLUCOSE-CAPILLARY: 274 mg/dL — AB (ref 65–99)
GLUCOSE-CAPILLARY: 138 mg/dL — AB (ref 65–99)
GLUCOSE-CAPILLARY: 64 mg/dL — AB (ref 65–99)
GLUCOSE-CAPILLARY: 99 mg/dL (ref 65–99)
GLUCOSE-CAPILLARY: 92 mg/dL (ref 65–99)
Glucose-Capillary: 91 mg/dL (ref 65–99)
Glucose-Capillary: 102 mg/dL — ABNORMAL HIGH (ref 65–99)

## 2016-10-22 LAB — CULTURE, RESPIRATORY W GRAM STAIN: Culture: NORMAL

## 2016-10-22 LAB — CULTURE, RESPIRATORY

## 2016-10-22 LAB — CBC
HEMATOCRIT: 28.9 % — AB (ref 36.0–46.0)
Hemoglobin: 9.2 g/dL — ABNORMAL LOW (ref 12.0–15.0)
MCH: 26.5 pg (ref 26.0–34.0)
MCHC: 31.8 g/dL (ref 30.0–36.0)
MCV: 83.3 fL (ref 78.0–100.0)
Platelets: 675 10*3/uL — ABNORMAL HIGH (ref 150–400)
RBC: 3.47 MIL/uL — ABNORMAL LOW (ref 3.87–5.11)
RDW: 17.7 % — AB (ref 11.5–15.5)
WBC: 17 10*3/uL — ABNORMAL HIGH (ref 4.0–10.5)

## 2016-10-22 LAB — BASIC METABOLIC PANEL
Anion gap: 11 (ref 5–15)
BUN: 136 mg/dL — AB (ref 6–20)
CALCIUM: 8.2 mg/dL — AB (ref 8.9–10.3)
CO2: 21 mmol/L — AB (ref 22–32)
CREATININE: 2.83 mg/dL — AB (ref 0.44–1.00)
Chloride: 112 mmol/L — ABNORMAL HIGH (ref 101–111)
GFR calc non Af Amer: 16 mL/min — ABNORMAL LOW (ref >60)
GFR, EST AFRICAN AMERICAN: 19 mL/min — AB (ref >60)
GLUCOSE: 263 mg/dL — AB (ref 65–99)
Potassium: 3.2 mmol/L — ABNORMAL LOW (ref 3.5–5.1)
Sodium: 144 mmol/L (ref 135–145)

## 2016-10-22 LAB — HEPARIN LEVEL (UNFRACTIONATED)
HEPARIN UNFRACTIONATED: 0.28 [IU]/mL — AB (ref 0.30–0.70)
Heparin Unfractionated: <0.1 IU/mL — ABNORMAL LOW (ref 0.30–0.70)
Heparin Unfractionated: 0.6 IU/mL (ref 0.30–0.70)

## 2016-10-22 LAB — MAGNESIUM: MAGNESIUM: 1.8 mg/dL (ref 1.7–2.4)

## 2016-10-22 LAB — PROTIME-INR
INR: 2.01
Prothrombin Time: 23.1 seconds — ABNORMAL HIGH (ref 11.4–15.2)

## 2016-10-22 MED ORDER — INSULIN ASPART 100 UNIT/ML ~~LOC~~ SOLN
2.0000 [IU] | SUBCUTANEOUS | Status: DC
  Administered 2016-10-22: 6 [IU] via SUBCUTANEOUS

## 2016-10-22 MED ORDER — MAGNESIUM SULFATE 2 GM/50ML IV SOLN: 2.0000 g | Freq: Once | INTRAVENOUS | Status: DC

## 2016-10-22 MED ORDER — FENTANYL CITRATE (PF) 100 MCG/2ML IJ SOLN
25.0000 ug | INTRAMUSCULAR | Status: DC | PRN
  Administered 2016-10-22 – 2016-10-23 (×4): 25 ug via INTRAVENOUS
  Administered 2016-10-23: 50 ug via INTRAVENOUS
  Administered 2016-10-23: 25 ug via INTRAVENOUS
  Filled 2016-10-22 (×5): qty 2

## 2016-10-22 MED ORDER — INSULIN ASPART 100 UNIT/ML ~~LOC~~ SOLN: 1.0000 [IU] | SUBCUTANEOUS | Status: DC

## 2016-10-22 MED ORDER — DEXTROSE 50 % IV SOLN
25.0000 mL | Freq: Once | INTRAVENOUS | Status: AC
  Administered 2016-10-22: 25 mL via INTRAVENOUS

## 2016-10-22 MED ORDER — HEPARIN BOLUS VIA INFUSION
2000.0000 [IU] | Freq: Once | INTRAVENOUS | Status: AC
  Administered 2016-10-22: 2000 [IU] via INTRAVENOUS
  Filled 2016-10-22: qty 2000

## 2016-10-22 MED ORDER — FUROSEMIDE 10 MG/ML IJ SOLN
20.0000 mg | Freq: Once | INTRAMUSCULAR | Status: AC
  Administered 2016-10-22: 20 mg via INTRAVENOUS
  Filled 2016-10-22: qty 2

## 2016-10-22 MED ORDER — DEXAMETHASONE SODIUM PHOSPHATE 4 MG/ML IJ SOLN
4.0000 mg | Freq: Once | INTRAMUSCULAR | Status: AC
  Administered 2016-10-22: 4 mg via INTRAVENOUS
  Filled 2016-10-22: qty 1

## 2016-10-22 MED ORDER — POTASSIUM CHLORIDE 20 MEQ/15ML (10%) PO SOLN
40.0000 meq | Freq: Once | ORAL | Status: AC
  Administered 2016-10-22: 40 meq
  Filled 2016-10-22: qty 30

## 2016-10-22 MED ORDER — MAGNESIUM SULFATE IN D5W 1-5 GM/100ML-% IV SOLN
1.0000 g | Freq: Once | INTRAVENOUS | Status: AC
  Administered 2016-10-22: 1 g via INTRAVENOUS
  Filled 2016-10-22: qty 100

## 2016-10-22 MED ORDER — RACEPINEPHRINE HCL 2.25 % IN NEBU
INHALATION_SOLUTION | RESPIRATORY_TRACT | Status: AC
  Filled 2016-10-22: qty 0.5

## 2016-10-22 MED ORDER — INSULIN GLARGINE 100 UNIT/ML ~~LOC~~ SOLN
22.0000 [IU] | Freq: Every day | SUBCUTANEOUS | Status: DC
  Administered 2016-10-22: 22 [IU] via SUBCUTANEOUS
  Filled 2016-10-22 (×2): qty 0.22

## 2016-10-22 MED ORDER — INSULIN ASPART 100 UNIT/ML ~~LOC~~ SOLN
2.0000 [IU] | SUBCUTANEOUS | Status: DC
  Administered 2016-10-22 – 2016-10-23 (×3): 2 [IU] via SUBCUTANEOUS
  Administered 2016-10-24 (×4): 4 [IU] via SUBCUTANEOUS
  Administered 2016-10-24 – 2016-10-25 (×3): 2 [IU] via SUBCUTANEOUS
  Administered 2016-10-25 (×2): 4 [IU] via SUBCUTANEOUS
  Administered 2016-10-25: 2 [IU] via SUBCUTANEOUS
  Administered 2016-10-25: 4 [IU] via SUBCUTANEOUS
  Administered 2016-10-25: 6 [IU] via SUBCUTANEOUS
  Administered 2016-10-26: 2 [IU] via SUBCUTANEOUS
  Administered 2016-10-26 (×2): 4 [IU] via SUBCUTANEOUS
  Administered 2016-10-26: 2 [IU] via SUBCUTANEOUS
  Administered 2016-10-27: 6 [IU] via SUBCUTANEOUS

## 2016-10-22 MED ORDER — RACEPINEPHRINE HCL 2.25 % IN NEBU
0.5000 mL | INHALATION_SOLUTION | Freq: Once | RESPIRATORY_TRACT | Status: AC
  Administered 2016-10-22: 0.5 mL via RESPIRATORY_TRACT

## 2016-10-22 MED ORDER — DEXTROSE 50 % IV SOLN
INTRAVENOUS | Status: AC
  Administered 2016-10-22: 25 mL via INTRAVENOUS
  Filled 2016-10-22: qty 50

## 2016-10-22 MED ORDER — SUMATRIPTAN SUCCINATE 6 MG/0.5ML ~~LOC~~ SOLN
6.0000 mg | Freq: Once | SUBCUTANEOUS | Status: AC
  Administered 2016-10-22: 6 mg via SUBCUTANEOUS
  Filled 2016-10-22: qty 0.5

## 2016-10-22 NOTE — Procedures (Signed)
Extubation Procedure Note  Patient Details:   Name: Joy Butler DOB: 16-Jan-1951 MRN: [id number]   Airway Documentation:     Evaluation  O2 sats: stable throughout Complications: No apparent complications Patient did tolerate procedure well. Bilateral Breath Sounds: Clear   Yes   Order received for extubation.  Cuff leak positive prior to extubation.  Patient extubated to [place].  Patient able to vocalize post extubation; no stridor was noted.  Will continue to monitor.  Phillis Knack [facility] 10/22/2016, 10:24 AM

## 2016-10-22 NOTE — Progress Notes (Signed)
Called to notify Dr Alva Garnet left dorsalis and posterior tib pulses absent by palpation and doppler, feet equally cool to the touch, no swelling or redness noted, pt denies discomfort.

## 2016-10-22 NOTE — Progress Notes (Signed)
ANTICOAGULATION CONSULT NOTE - Follow Up Consult  Pharmacy Consult for Heparin Indication: History of pulmonary embolus  Allergies  Allergen Reactions  . Keflex [Cephalexin] Itching and Swelling    Lips and eyes swelled up  . Clindamycin/Lincomycin Itching  . Latex Itching  . Sulfa Antibiotics Rash    Patient Measurements: Height: 5\' 9"  (175.3 cm) Weight: 200 lb 6.4 oz (90.9 kg) IBW/kg (Calculated) : 66.2 Heparin Dosing Weight: 86 kg  Vital Signs: Temp: 97.4 F (36.3 C) (11/01 2349) Temp Source: Oral (11/01 2349) BP: 125/76 (11/01 1835) Pulse Rate: 60 (11/02 0107)  Labs:  Recent Labs  10/19/16 2021 10/20/16 [id number]  10/20/16 2254 10/21/16 0228 10/21/16 1150 10/21/16 2139 10/22/16 0224 10/22/16 0228  HGB  --  10.4*  --   --  10.8*  --   --  9.2*  --   HCT  --  32.2*  --   --  32.5*  --   --  28.9*  --   PLT  --  616*  --   --  596*  --   --  675*  --   LABPROT  --  22.5*  --   --  23.6*  --   --  23.1*  --   INR  --  1.95  --   --  2.07  --   --  2.01  --   HEPARINUNFRC 0.49 0.97*  < >  --  0.81* 0.90* 0.36  --  0.28*  CREATININE 3.76* 3.57*  --  3.30*  --   --   --   --   --   < > = values in this interval not displayed.  Estimated Creatinine Clearance: 19.9 mL/min (by C-G formula based on SCr of 3.3 mg/dL (H)).  Assessment: 65 yo female w/ history of PE on Xarelto PTA. Pt was reportedly noncompliant with Xarelto so transitioned to warfarin w/ heparin bridge on admission. Given potential need for trach, warfarin discontinued and heparin drip continued alone. Heparin level now down to slightly subtherapeutic (0.28) on gtt at 1100 units/hr. Hgb down to 9.2,  No bleeding noted. No issue with infusion per RN.  Goal of Therapy:  Heparin level 0.3-0.7 units/ml Monitor platelets by anticoagulation protocol: Yes   Plan:  -Increase heparin to 1200 units/hr -Will f/u 8 hr heparin level  Sherlon Handing, PharmD, BCPS Clinical pharmacist, pager [phone] 10/22/16 3:23  AM

## 2016-10-22 NOTE — NC FL2 (Signed)
[place] MEDICAID FL2 LEVEL OF CARE SCREENING TOOL     IDENTIFICATION  Patient Name: Joy Butler Birthdate: Feb 08, 1951 Sex: female Admission Date (Current Location): 10/10/2016  [facility] and [place] Number:  Herbalist and Address:  [facility], Wing [address] [place], [place] [address]      Provider Number: [id number]  Attending Physician Name and Address:  Brand Males, MD  Relative Name and Phone Number:  Ileene Hutchinson (Spouse) [phone]; [phone]    Current Level of Care: Hospital Recommended Level of Care: [place] Prior Approval Number:    Date Approved/Denied:   PASRR Number:   [id number]  Discharge Plan: SNF    Current Diagnoses: Patient Active Problem List   Diagnosis Date Noted  . Pressure injury of skin 10/22/2016  . CAP (community acquired pneumonia) 10/11/2016  . Severe sepsis with acute organ dysfunction ([place]) 10/11/2016  . Pneumonia 10/11/2016  . Acute on chronic respiratory failure with hypoxia ([place]) 10/11/2016  . ARDS (adult respiratory distress syndrome) ([place]) 10/11/2016  . Septic shock ([place]) 10/11/2016  . Fatigue 07/06/2016  . Type 2 diabetes mellitus with complication, with long-term current use of insulin ([place])   . Weakness of right lower extremity   . Spondylosis of lumbar region without myelopathy or radiculopathy   . Chronic renal insufficiency   . Candida infection   . Cellulitis of back except buttock   . Type 2 diabetes mellitus with skin complication (HCC)   . Abscess of back 03/27/2016  . Fall 03/20/2016  . Pressure ulcer 03/09/2016  . AKI (acute kidney injury) ([place])   . Diarrhea   . Generalized abdominal pain   . Gastroenteritis 03/07/2016  . Acute kidney injury superimposed on chronic kidney disease ([place]) 01/15/2016  . Unexplained weight loss   . Abdominal pain 07/17/2015  . Leg swelling 04/08/2015  . Insomnia 04/08/2015  . Other bilateral secondary  osteoarthritis of knee 03/27/2015  . Breast cyst 02/07/2015  . Cataract 01/23/2015  . Diabetic retinopathy ([place]) 08/08/2014  . Hyperlipidemia 07/31/2014  . Abnormality of gait 07/30/2014  . Diabetic foot ulcer ([place]) 07/26/2014  . Intertrigo 05/16/2014  . Falls 08/14/2013  . Depression 03/01/2013  . Health care maintenance 01/30/2013  . Hypercholesteremia 01/29/2013  . Smoking 01/29/2013  . External hemorrhoids 01/29/2013  . Long term current use of anticoagulant therapy 10/24/2012  . Recurrent pulmonary embolism ([place]) 10/17/2012  . Diabetic peripheral neuropathy ([place]) 08/20/2012  . Chronic back pain 08/20/2012  . DM (diabetes mellitus), type 2 ([place]) 08/17/2012    Orientation RESPIRATION BLADDER Height & Weight     Self, Situation, Time, Place  O2 (4L NASAL CANNULA) Continent, Indwelling catheter Weight: 200 lb 6.4 oz (90.9 kg) Height:  _0  (175.3 cm)  BEHAVIORAL SYMPTOMS/MOOD NEUROLOGICAL BOWEL NUTRITION STATUS   (NONE)  (NONE) Continent Diet (NPO- PENDING SWALLOW EVAL )  AMBULATORY STATUS COMMUNICATION OF NEEDS Skin   Limited Assist Verbally PU Stage and Appropriate Care, Other (Comment)   PU Stage 2 Dressing:  (EVERY 5 DAYS)                   Personal Care Assistance Level of Assistance  Dressing, Feeding, Bathing Bathing Assistance: Limited assistance Feeding assistance: Independent Dressing Assistance: Limited assistance     Functional Limitations Info  Sight, Hearing, Speech Sight Info: Impaired Hearing Info: Adequate Speech Info: Adequate    SPECIAL CARE FACTORS FREQUENCY  PT (By licensed PT)     PT Frequency:  MIN 3X/WEEK              Contractures Contractures Info: Not present    Additional Factors Info  Code Status, Allergies, Insulin Sliding Scale Code Status Info: FULL Allergies Info: KEFLEX; CLINDAMYCIN/LINCOMYCIN; LATEX; SULFA ANTIBIOTICS   Insulin Sliding Scale Info: novoLOG injection 2-6 units every 4 hrs; Lantus injection 22 units        Current Medications (10/22/2016):  This is the current hospital active medication list Current Facility-Administered Medications  Medication Dose Route Frequency Provider Last Rate Last Dose  . 0.45 % sodium chloride infusion   Intravenous Continuous Brand Males, MD 10 mL/hr at 10/22/16 0909    . 0.9 %  sodium chloride infusion  250 mL Intravenous PRN Donita Brooks, NP 10 mL/hr at 10/20/16 0939 10 mL/hr at 10/20/16 0939  . amLODipine (NORVASC) tablet 10 mg  10 mg Per Tube Daily Rogue Bussing, MD   10 mg at 10/22/16 1000  . bisacodyl (DULCOLAX) suppository 10 mg  10 mg Rectal Daily PRN Donita Brooks, NP      . chlorhexidine gluconate (MEDLINE KIT) (PERIDEX) 0.12 % solution 15 mL  15 mL Mouth Rinse BID Brand Males, MD   15 mL at 10/22/16 0756  . docusate (COLACE) 50 MG/5ML liquid 100 mg  100 mg Per Tube BID PRN Donita Brooks, NP      . famotidine (PEPCID) IVPB 20 mg premix  20 mg Intravenous Q24H Brand Males, MD   20 mg at 10/22/16 0935  . fentaNYL (SUBLIMAZE) injection 25-50 mcg  25-50 mcg Intravenous Q2H PRN [name], MD   25 mcg at 10/22/16 7616  . heparin ADULT infusion 100 units/mL (25000 units/234m sodium chloride 0.45%)  1,200 Units/hr Intravenous Continuous CFranky Macho RPH 12 mL/hr at 10/22/16 0326 1,200 Units/hr at 10/22/16 0326  . imipenem-cilastatin (PRIMAXIN) 500 mg in sodium chloride 0.9 % 100 mL IVPB  500 mg Intravenous Q12H MEinar Grad RPH   500 mg at 10/22/16 0500  . insulin aspart (novoLOG) injection 2-6 Units  2-6 Units Subcutaneous Q4H TMercy Riding MD   2 Units at 10/22/16 1225  . insulin glargine (LANTUS) injection 22 Units  22 Units Subcutaneous Daily TMercy Riding MD   22 Units at 10/22/16 0935  . ipratropium-albuterol (DUONEB) 0.5-2.5 (3) MG/3ML nebulizer solution 3 mL  3 mL Nebulization Q6H BDonita Brooks NP   3 mL at 10/22/16 0758  . MEDLINE mouth rinse  15 mL Mouth Rinse QID MBrand Males MD   15 mL at 10/22/16 1226   . metoprolol (LOPRESSOR) injection 2.5-5 mg  2.5-5 mg Intravenous Q3H PRN EColbert Coyer MD   2.5 mg at 10/16/16 00737 . ondansetron (ZOFRAN) injection 4 mg  4 mg Intravenous Q6H PRN BDonita Brooks NP   4 mg at 10/19/16 1018  . polyethylene glycol (MIRALAX / GLYCOLAX) packet 17 g  17 g Per Tube Daily PRN HRogue Bussing MD      . pravastatin (PRAVACHOL) tablet 40 mg  40 mg Oral QPM Alexa RAngela Burke MD   40 mg at 10/21/16 1705  . sodium chloride flush (NS) 0.9 % injection 3 mL  3 mL Intravenous Q12H Alexa RAngela Burke MD   3 mL at 10/22/16 0929  . vancomycin (VANCOCIN) IVPB 1000 mg/200 mL premix  1,000 mg Intravenous Q48H MEinar Grad RPH   1,000 mg at 10/20/16 1602     Discharge Medications: Please see discharge  summary for a list of discharge medications.  Relevant Imaging Results:  Relevant Lab Results:   Additional Information SS #: [id number]  Judeth Horn, LCSW

## 2016-10-22 NOTE — Progress Notes (Signed)
Called and updated patient's sister, Iran Planas at [phone] with patient's permission. Daphinie asks about the definite diagnosis of her sister and prognosis. I told her that her sister has pneumonia and ARDS that caused her to be intubated. She has been extubated few minutes ago. She is still on antibiotics to treat the infection. I could not give her a definite prognosis at this time. However, there is clinical improvement. There is a potential for transfer to CIR per PT recommendation. Daphine voiced understanding and appreciated the call. She had no further question.

## 2016-10-22 NOTE — Progress Notes (Signed)
RT NOTE:  Racepinephrine ordered for audible stridor. Secondary opinion also felt this was needed. Pt has raspy breath sounds on inhalation/exhalation, deep suction w/little return, pt whispers when speaking. O2 has been increased to 55% VM (SpO2 93-95%) RT will monitor.

## 2016-10-22 NOTE — Progress Notes (Signed)
PULMONARY / CRITICAL CARE MEDICINE   Name: DEZAREE WELCOME MRN: [id number] DOB: February 12, 1951    ADMISSION DATE:  10/10/2016 CONSULTATION DATE:  10/11/16   REFERRING MD:  Dr. Reesa Chew / IMTS   CHIEF COMPLAINT:  Multi-focal PNA / Acute Respiratory Failure   Brief:  [name], occasional cigarette smoking, with PMH of arthritis, cataracts, chronic back pain, migraines, depression, HLD, HTN, splenic infarct, DM II, PE (2011, on xarelto with intermittent compliance) who presented to [facility] on 10/22 with reports of lower extremity pain and shortness of breath. The patient also reported a possible syncopal episode.  She presented to the ER via EMS. Per report, she had a memory of the fall. EMS found the patient to be hypoxic with room air saturations of 80%. She does not wear home oxygen at baseline. She was placed on a nonrebreather with improvement to 90%. The patient reported development of a cough, weakness and "not feeling well" one week prior to presentation. Initial ER evaluation found her to have increased work of breathing on exam with saturations in the 70s on room air. She had a mild elevation of lactic acid at 2.84 and rectal temperature of 100.8. The patient was found to have a chest x-ray concerning for multifocal pneumonia. Initially she was not hypotensive. However, later in the morning the patient developed hypotension and increased work of breathing. ABG evaluation demonstrated worsening acidosis. Initial labs-NA 138, K3.3, creatinine 3.85 (up from 2.46), bun 58, anion gap 14, alkaline phosphatase 123, albumin 1.6, BNP 284, troponin 0.07, WBC 18.7, hemoglobin 13.1, and platelets 397.  She felt nausea prior to intubation and vomited (the patient was able to notify staff, sat upright and secretions cleared via suction). Due to bilateral opacities, hypoxic respiratory failure and developing hypotension the decision was made to intubate the patient for airway protection.  PCCM called for ICU  admission.  TUDIES:  ECHO 10/22: LVEF 40-45% with mild LDH and G1DD (worse compared to EF of 50-55% 08/2012) CT Chest 10/22 >> extensive bilateral consolidative changes consistent with multifocal pneumonia, mild cardiomegaly with multivessel coronary disease  CULTURES: Influenza 10/22: negative BCx2 10/22 >> strep pneumo UA 10/22: proteinuria, small hgb, moderate bilirubin, negative nitrites and leukocytes; rbc 0-5 on add-on UC 10/22: < 10,000 colonies Strep pneumo urinary Ag 10/22: positive Respiratory panel 10/22: Negative  ANTIBIOTICS: Vanco 10/22 >> 10/24, 10/31>> Levaquin 10/22 >> 10/24 PRIMAXIN 10/31>>   LINES/TUBES: ETT 10/22 >> 10/29>Resp secretion>10/29>> PIV x 2>> Foley 10/22 >>   SIGNIFICANT EVENTS: 10/22  Admit per IMTS with PNA, failed BiPAP & required intubation  11/1: walked without complications. CIR to assess for admission when she is off vent.   Subjective:  11/2: Patient awake. Responds to questions. She likes to have her mint taken off. She is also asking for water. Reports back pain but is controlled with as needed fentanyl. She is okay with updating her daughter    VITAL SIGNS: BP 125/76   Pulse 62   Temp 97.1 F (36.2 C) (Axillary)   Resp (!) 22   Ht 5\' 9"  (1.753 m)   Wt 90.9 kg (200 lb 6.4 oz)   SpO2 100%   BMI 29.59 kg/m   HEMODYNAMICS:  Stable  VENTILATOR SETTINGS: Vent Mode: PRVC FiO2 (%):  [40 %] 40 % Set Rate:  [18 bmp] 18 bmp Vt Set:  [530 mL] 530 mL PEEP:  [5 cmH20] 5 cmH20 Pressure Support:  [5 cmH20-8 cmH20] 8 cmH20 Plateau Pressure:  [14 cmH20-18 cmH20] 15  cmH20  INTAKE / OUTPUT: I/O last 3 completed shifts: In: 6498.7 [I.V.:4228.7; NG/GT:1670; IV [id number] Out: [id number] [Urine:3625; Emesis/NG output:550]  PHYSICAL EXAMINATION: General:  Chronically ill appearing female, NAD, intubated, lying in bed Neuro: Eyes opens to voice, nods answers to questions and following commands; indicating she wants food and water   HEENT:  MM pink/dry, no jvd, ETT in place Cardiovascular:  s1s2 rrr, no m/r/g, diminished DP and PT pulses in LE's Lungs: CTAB, improved  Abdomen:  Obese/soft, no TTP, bsx4 active  Musculoskeletal:  No acute deformities, BLE symmetrical  Skin:  Cold extremity, no edema  LABS:  BMET  Recent Labs Lab 10/20/16 0653 10/20/16 2254 10/22/16 0224  NA 147* 145 144  K 3.5 4.1 3.2*  CL 113* 113* 112*  CO2 22 18* 21*  BUN 171* 159* 136*  CREATININE 3.57* 3.30* 2.83*  GLUCOSE 399* 343* 263*    Electrolytes  Recent Labs Lab 10/18/16 0420 10/19/16 0456  10/20/16 0653 10/20/16 2254 10/22/16 0224  CALCIUM 8.4* 8.6*  < > 8.8* 8.6* 8.2*  MG 2.1 2.4  --  2.3  --   --   PHOS 4.6 6.0*  --  5.2*  --   --   < > = values in this interval not displayed. CBC  Recent Labs Lab 10/20/16 0653 10/21/16 0228 10/22/16 0224  WBC 24.2* 21.1* 17.0*  HGB 10.4* 10.8* 9.2*  HCT 32.2* 32.5* 28.9*  PLT 616* 596* 675*   Coag's  Recent Labs Lab 10/20/16 0653 10/21/16 0228 10/22/16 0224  INR 1.95 2.07 2.01   Sepsis Markers No results for input(s): LATICACIDVEN, PROCALCITON, O2SATVEN in the last 168 hours. ABG  Recent Labs Lab 10/16/16 0322 10/18/16 0440 10/18/16 0455  PHART 7.441 TEST WILL BE CREDITED 7.507*  PCO2ART 33.2 TEST WILL BE CREDITED 32.2  PO2ART 82.0* TEST WILL BE CREDITED 82.7*   Liver Enzymes No results for input(s): AST, ALT, ALKPHOS, BILITOT, ALBUMIN in the last 168 hours. Cardiac Enzymes No results for input(s): TROPONINI, PROBNP in the last 168 hours. Glucose  Recent Labs Lab 10/21/16 0736 10/21/16 1200 10/21/16 1522 10/21/16 1937 10/21/16 2252 10/22/16 0334  GLUCAP 255* 198* 162* 181* 194* 247*   Imaging No results found. S DISCUSSION: [name] with PMH of PE (non-compliant with xarelto), HTN, CKD, HLD admitted 10/22 per IMTS with multifocal PNA.  Developed worsening respiratory failure requiring intubation in ER. Found to have Strep pneumo  bacteremia. Extubated 10/29 but reintubated for respiratory secretion.   ASSESSMENT / PLAN:  PULMONARY A: Acute Hypoxic Respiratory Failure - in the setting of multi-focal PNA  Multi-focal PNA - R>L opacities  Hx PE - on Xarelto, non-compliant with anticoagulation  Strep pneumo positive with urine Ag testing Net neg 1.0 P:   Hold further diureses at this point. No sign of fluid overload Wean off vent as able.  IS per RT protocol Wean FiO2 for sats > 92% Trend CXR - stable See ID section S/p 7 doses solumedrol Consider trach Duonebs q6h  CARDIOVASCULAR A:  Septic Shock - in setting of multifocal PNA Syncopal Episode - prior to admit Hx HTN, HLD LVEF 40-45% with mild LDH and G1DD (worse compared to EF of 50-55% 08/2012) P:  ICU monitoring of hemodynamics  D/Ced pressors Continue home norvasc Lopressor 2.5 mg q3h prn  RENAL A:   Acute on Chronic Kidney Injury  - improving. sCr 2.83. b/l~ 2.4 Hypokalemia - stable Pseudo-hypocalcemia - corrects normal (8.7) for hypoalbuminemia At least CKD IIIB given  labs earlier this year Renal function worse but stable Free water deficit of 2.5 L P:   Trend BMP/UOP  KVO IVF given hyperchloremia Hold further lasix at this point Replace electrolytes as indicated. If UOP drops then will likely need to call renal for ? Of dialysis, no acute indications right now. 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 S/p K P:  Trend CBC Dcd coumadin 10/30 Hep drip PT/INR pending  INFECTIOUS A:   Multifocal PNA  Leukocytosis not resolved --> 19.8 > 18.6 > 21.4 > 17.0 P:   Follow pan cultures  Abx as above, note allergies (keflex - facial swelling, clinda, sulfa) Trend PCT down 47 on 10/24 Vanc  10/22>>10/24, 10/31>> Levaquin 10/22>10/24 PRIMAXIN 10/31>>  ENDOCRINE A:   DM - last hgb A1c 6.2 on 07/06/16   Am CBG 247 P:   SSI-renal Increase Lantus to 22u and TF coverage started with steroids CBGs q4h Steroids d/c'ed after 4 doses  NEUROLOGIC A:   Chronic Back Pain Migraines   P:   RASS goal: 0 to -1  Precedex  Fentanyl PRN for pain  FAMILY  - Updates: No family at bedside. Will update after round.   Wendee Beavers, MD. [place], PGY-2  10/22/2016 6:57 AM

## 2016-10-22 NOTE — Care Management Note (Signed)
Case Management Note  Patient Details  Name: ZAKIYA ZWEIG MRN: [id number] Date of Birth: [birth year]  Subjective/Objective:    Pt admitted with with CAP                Action/Plan:  Pt is now intubated   Expected Discharge Date:                  Expected Discharge Plan:  [place]  In-House Referral:     Discharge planning Services  CM Consult  Post Acute Care Choice:    Choice offered to:     DME Arranged:    DME Agency:     HH Arranged:    [place] Agency:     Status of Service:  In process, will continue to follow  If discussed at Long Length of Stay Meetings, dates discussed:    Additional Comments: 10/22/2016  Pt extubated this am.  CIR recommended and CSW consulted as back up plan  10/20/16 Pt remains intubated.  CM was able to reach husband by phone.  Husband informed CM; pt uses walker and cane in the home when necessary due to pre - existing knee issues, pt is independent with all ADL's, husband denied barriers to obtaining medications, and stated wife does not receive any HH.  CM will continue to follow for discharge needs  10/16/16 Pt remains intubated-  CM unable to reach family/husband.  CM will continue to follow for discharge needs Maryclare Labrador, RN 10/22/2016, 10:31 AM

## 2016-10-22 NOTE — Progress Notes (Signed)
[facility] Progress Note Patient Name: Joy Butler DOB: 1951/05/20 MRN: [id number]   Date of Service  10/22/2016  HPI/Events of Note    eICU Interventions  PRN fentanyl     Intervention Category Intermediate Interventions: Pain - evaluation and management  [name] 10/22/2016, 12:01 AM

## 2016-10-22 NOTE — Progress Notes (Signed)
Noted pt extubated. Please place an inpt rehab consult order if you would like pt to be considered for an inpt rehab admission. [phone]

## 2016-10-22 NOTE — Progress Notes (Signed)
ANTICOAGULATION CONSULT NOTE - Follow Up Consult  Pharmacy Consult for Heparin Indication: History of pulmonary embolus  Allergies  Allergen Reactions  . Keflex [Cephalexin] Itching and Swelling    Lips and eyes swelled up  . Clindamycin/Lincomycin Itching  . Latex Itching  . Sulfa Antibiotics Rash    Patient Measurements: Height: 5\' 9"  (175.3 cm) Weight: 200 lb 6.4 oz (90.9 kg) IBW/kg (Calculated) : 66.2 Heparin Dosing Weight: 86 kg  Vital Signs: Temp: 97.6 F (36.4 C) (11/02 1156) Temp Source: Oral (11/02 1156) BP: 134/79 (11/02 1200) Pulse Rate: 78 (11/02 1200)  Labs:  Recent Labs  10/20/16 0653  10/20/16 2254 10/21/16 0228  10/21/16 2139 10/22/16 0224 10/22/16 0228 10/22/16 1200  HGB 10.4*  --   --  10.8*  --   --  9.2*  --   --   HCT 32.2*  --   --  32.5*  --   --  28.9*  --   --   PLT 616*  --   --  596*  --   --  675*  --   --   LABPROT 22.5*  --   --  23.6*  --   --  23.1*  --   --   INR 1.95  --   --  2.07  --   --  2.01  --   --   HEPARINUNFRC 0.97*  < >  --  0.81*  < > 0.36  --  0.28* <0.10*  CREATININE 3.57*  --  3.30*  --   --   --  2.83*  --   --   < > = values in this interval not displayed.  Estimated Creatinine Clearance: 23.2 mL/min (by C-G formula based on SCr of 2.83 mg/dL (H)).  Assessment: 65 yo female w/ history of PE on Xarelto PTA. Pt was reportedly noncompliant with Xarelto so transitioned to warfarin w/ heparin bridge on admission. Given potential need for trach, warfarin discontinued and heparin drip continued alone. Heparin level labile. Heparin now subtherapeutic (<0.10) on gtt at 1200 units/hr. Nursing reports heparin gtt has not been held or stopped today. Hgb low but stable at 9.2, plt wnl, and INR 2.01.  No bleeding noted.  Goal of Therapy:  Heparin level 0.3-0.7 units/ml Monitor platelets by anticoagulation protocol: Yes   Plan:  -Heparin bolus 2000 units x1  -Increase heparin to 1400 units/hr -Will f/u 8 hr heparin  level -Daily heparin level and CBC -Monitor for s/s bleeding   Argie Ramming, PharmD Pharmacy Resident  Pager [phone] 10/22/16 2:13 PM

## 2016-10-23 DIAGNOSIS — D62 Acute posthemorrhagic anemia: Secondary | ICD-10-CM

## 2016-10-23 DIAGNOSIS — Z91199 Patient's noncompliance with other medical treatment and regimen due to unspecified reason: Secondary | ICD-10-CM

## 2016-10-23 DIAGNOSIS — Z9119 Patient's noncompliance with other medical treatment and regimen: Secondary | ICD-10-CM

## 2016-10-23 DIAGNOSIS — R000 Tachycardia, unspecified: Secondary | ICD-10-CM

## 2016-10-23 DIAGNOSIS — D72820 Lymphocytosis (symptomatic): Secondary | ICD-10-CM

## 2016-10-23 DIAGNOSIS — Z86711 Personal history of pulmonary embolism: Secondary | ICD-10-CM

## 2016-10-23 DIAGNOSIS — E1140 Type 2 diabetes mellitus with diabetic neuropathy, unspecified: Secondary | ICD-10-CM

## 2016-10-23 DIAGNOSIS — I10 Essential (primary) hypertension: Secondary | ICD-10-CM

## 2016-10-23 DIAGNOSIS — R0682 Tachypnea, not elsewhere classified: Secondary | ICD-10-CM

## 2016-10-23 DIAGNOSIS — R7309 Other abnormal glucose: Secondary | ICD-10-CM

## 2016-10-23 DIAGNOSIS — E870 Hyperosmolality and hypernatremia: Secondary | ICD-10-CM

## 2016-10-23 DIAGNOSIS — I509 Heart failure, unspecified: Secondary | ICD-10-CM

## 2016-10-23 DIAGNOSIS — G894 Chronic pain syndrome: Secondary | ICD-10-CM

## 2016-10-23 DIAGNOSIS — E876 Hypokalemia: Secondary | ICD-10-CM

## 2016-10-23 DIAGNOSIS — Z794 Long term (current) use of insulin: Secondary | ICD-10-CM

## 2016-10-23 DIAGNOSIS — I5043 Acute on chronic combined systolic (congestive) and diastolic (congestive) heart failure: Secondary | ICD-10-CM

## 2016-10-23 LAB — CBC
HEMATOCRIT: 32.3 % — AB (ref 36.0–46.0)
HEMOGLOBIN: 10.6 g/dL — AB (ref 12.0–15.0)
MCH: 27.5 pg (ref 26.0–34.0)
MCHC: 32.8 g/dL (ref 30.0–36.0)
MCV: 83.7 fL (ref 78.0–100.0)
Platelets: 711 10*3/uL — ABNORMAL HIGH (ref 150–400)
RBC: 3.86 MIL/uL — ABNORMAL LOW (ref 3.87–5.11)
RDW: 17.9 % — AB (ref 11.5–15.5)
WBC: 20.8 10*3/uL — ABNORMAL HIGH (ref 4.0–10.5)

## 2016-10-23 LAB — BASIC METABOLIC PANEL
Anion gap: 9 (ref 5–15)
BUN: 125 mg/dL — AB (ref 6–20)
CALCIUM: 8.8 mg/dL — AB (ref 8.9–10.3)
CHLORIDE: 116 mmol/L — AB (ref 101–111)
CO2: 21 mmol/L — AB (ref 22–32)
CREATININE: 2.85 mg/dL — AB (ref 0.44–1.00)
GFR calc non Af Amer: 16 mL/min — ABNORMAL LOW (ref >60)
GFR, EST AFRICAN AMERICAN: 19 mL/min — AB (ref >60)
GLUCOSE: 168 mg/dL — AB (ref 65–99)
Potassium: 4.7 mmol/L (ref 3.5–5.1)
Sodium: 146 mmol/L — ABNORMAL HIGH (ref 135–145)

## 2016-10-23 LAB — PROTIME-INR
INR: 1.65
Prothrombin Time: 19.7 seconds — ABNORMAL HIGH (ref 11.4–15.2)

## 2016-10-23 LAB — GLUCOSE, CAPILLARY
GLUCOSE-CAPILLARY: 124 mg/dL — AB (ref 65–99)
GLUCOSE-CAPILLARY: 145 mg/dL — AB (ref 65–99)
GLUCOSE-CAPILLARY: 151 mg/dL — AB (ref 65–99)
Glucose-Capillary: 124 mg/dL — ABNORMAL HIGH (ref 65–99)
Glucose-Capillary: 177 mg/dL — ABNORMAL HIGH (ref 65–99)
Glucose-Capillary: 85 mg/dL (ref 65–99)

## 2016-10-23 LAB — HEPARIN LEVEL (UNFRACTIONATED): HEPARIN UNFRACTIONATED: 0.63 [IU]/mL (ref 0.30–0.70)

## 2016-10-23 MED ORDER — FUROSEMIDE 10 MG/ML IJ SOLN
80.0000 mg | Freq: Three times a day (TID) | INTRAMUSCULAR | Status: DC
  Administered 2016-10-23 (×3): 80 mg via INTRAVENOUS
  Filled 2016-10-23 (×4): qty 8

## 2016-10-23 MED ORDER — DEXAMETHASONE SODIUM PHOSPHATE 4 MG/ML IJ SOLN
2.0000 mg | Freq: Three times a day (TID) | INTRAMUSCULAR | Status: AC
  Administered 2016-10-24 – 2016-10-25 (×3): 2 mg via INTRAVENOUS
  Filled 2016-10-23 (×3): qty 1

## 2016-10-23 MED ORDER — FUROSEMIDE 10 MG/ML IJ SOLN: 60.0000 mg | Freq: Three times a day (TID) | INTRAMUSCULAR | Status: DC

## 2016-10-23 MED ORDER — INSULIN GLARGINE 100 UNIT/ML ~~LOC~~ SOLN
20.0000 [IU] | Freq: Every day | SUBCUTANEOUS | Status: DC
  Filled 2016-10-23: qty 0.2

## 2016-10-23 MED ORDER — ENOXAPARIN SODIUM 100 MG/ML ~~LOC~~ SOLN
1.0000 mg/kg | SUBCUTANEOUS | Status: DC
  Administered 2016-10-23 – 2016-11-05 (×13): 90 mg via SUBCUTANEOUS
  Filled 2016-10-23 (×3): qty 1
  Filled 2016-10-23: qty 0.9
  Filled 2016-10-23 (×10): qty 1

## 2016-10-23 MED ORDER — DEXAMETHASONE SODIUM PHOSPHATE 4 MG/ML IJ SOLN
2.0000 mg | Freq: Four times a day (QID) | INTRAMUSCULAR | Status: AC
  Administered 2016-10-23 – 2016-10-24 (×4): 2 mg via INTRAVENOUS
  Filled 2016-10-23 (×2): qty 0.5
  Filled 2016-10-23 (×2): qty 1

## 2016-10-23 MED ORDER — DEXAMETHASONE SODIUM PHOSPHATE 4 MG/ML IJ SOLN
2.0000 mg | Freq: Two times a day (BID) | INTRAMUSCULAR | Status: AC
  Administered 2016-10-25 (×2): 2 mg via INTRAVENOUS
  Filled 2016-10-23 (×2): qty 1

## 2016-10-23 NOTE — Progress Notes (Signed)
Patient needs to be picked by IMTS 10/24/16 and not triad hospitalist. I called Dr Reesa Chew PGY 1 of IMTS and handed over patient to her 10/24/16  Dr. Brand Males, M.D., [facility].C.P Pulmonary and Critical Care Medicine Staff Physician [facility] Pager: [phone], If no answer or between  15:00h - 7:00h: call [phone]  10/23/2016 5:35 PM

## 2016-10-23 NOTE — Progress Notes (Signed)
I will follow up Monday with pt's progress with therapy to assist in determining rehab venue options with [facility]. I will order an OT eval. [phone]

## 2016-10-23 NOTE — Evaluation (Signed)
Clinical/Bedside Swallow Evaluation Patient Details  Name: Joy Butler MRN: [id number] Date of Birth: October 14, 1951  Today's Date: 10/23/2016 Time: SLP Start Time (ACUTE ONLY): 1033 SLP Stop Time (ACUTE ONLY): 1049 SLP Time Calculation (min) (ACUTE ONLY): 16 min  Past Medical History:  Past Medical History:  Diagnosis Date  . Arthritis    "knees" (07/26/2014)  . Cataract of both eyes   . Chronic back pain   . Daily headache   . Depression   . High cholesterol   . Hypertension   . Hypertensive emergency 12/03/2015  . Kidney stones   . Migraine    "last one was in the 1990's" (07/26/2014)  . Neuropathy (HCC)    feet  . Pneumonia    "several times" (07/26/2014)  . Pulmonary embolism ([place])    2011 treated at [facility] in [place].  Was on Coumadin for over a year.  no known family history  . 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 Type II diabetes mellitus ([place]) dx'd 2000  . UTI (urinary tract infection)    Past Surgical History:  Past Surgical History:  Procedure Laterality Date  . APPENDECTOMY    . ARTERY BIOPSY Right 05/09/2014   Procedure: BIOPSY TEMPORAL ARTERY;  Surgeon: Rosetta Posner, MD;  Location: [place];  Service: Vascular;  Laterality: Right;  . BREAST BIOPSY Left   . CARPAL TUNNEL RELEASE Bilateral   . CESAREAN SECTION  1982  . CHOLECYSTECTOMY     1980  . COLONOSCOPY WITH PROPOFOL N/A 12/06/2015   Procedure: COLONOSCOPY WITH PROPOFOL;  Surgeon: Wilford Corner, MD;  Location: [facility] ENDOSCOPY;  Service: Endoscopy;  Laterality: N/A;  . CYSTOSCOPY W/ STONE MANIPULATION    . DILATION AND CURETTAGE OF UTERUS  1982  . ESOPHAGOGASTRODUODENOSCOPY  08/19/2012   Procedure: ESOPHAGOGASTRODUODENOSCOPY (EGD);  Surgeon: Winfield Cunas., MD;  Location: [facility] ENDOSCOPY;  Service: Endoscopy;  Laterality: N/A;  . ESOPHAGOGASTRODUODENOSCOPY (EGD) WITH PROPOFOL N/A 12/06/2015   Procedure: ESOPHAGOGASTRODUODENOSCOPY (EGD) WITH PROPOFOL;  Surgeon: Wilford Corner, MD;  Location: [facility]  ENDOSCOPY;  Service: Endoscopy;  Laterality: N/A;  . EYE SURGERY Bilateral   . INCISION AND DRAINAGE ABSCESS N/A 03/28/2016   Procedure: INCISION, DRAINAGE  AND DEBRIDEMENT BACK  ABSCESS;  Surgeon: Greer Pickerel, MD;  Location: [place];  Service: General;  Laterality: N/A;  . REFRACTIVE SURGERY Bilateral   . TEMPORAL ARTERY BIOPSY / LIGATION Right 04/2014  . TONSILLECTOMY    . VAGINAL HYSTERECTOMY     HPI:  [name] with PMH of PE (non-compliant with xarelto), HTN, CKD, HLD admitted 10/22 per IMTS with multifocal PNA. Developed worsening respiratory failure requiring intubation 10/22-10/29 with reintubation 10/29-11/2.   Assessment / Plan / Recommendation Clinical Impression  Pt presents with what is likely an acute, reversible dysphagia s/p prolonged intubation. Her low, hoarse voice and weak cough are suggestive of incomplete glottal closure, and immediate coughing with ice chips and small amounts of water is concerning for impaired airway protection. Recommend to remain NPO with additional SLP f/u to determine readiness for PO diet. Prognosis is good for diet initiation with increased time post-extubation.    Aspiration Risk  Severe aspiration risk    Diet Recommendation NPO;Alternative means - temporary   Medication Administration: Via alternative means    Other  Recommendations Oral Care Recommendations: Oral care QID Other Recommendations: Have oral suction available   Follow up Recommendations  (tba)      Frequency and Duration min 2x/week  2 weeks       Prognosis Prognosis for Safe Diet Advancement: Good      Swallow Study   General HPI: [name] with PMH of PE (non-compliant with xarelto), HTN, CKD, HLD admitted 10/22 per IMTS with multifocal PNA. Developed worsening respiratory failure requiring intubation 10/22-10/29 with reintubation 10/29-11/2. Type of Study: Bedside Swallow Evaluation Previous Swallow Assessment: none in chart Diet Prior to this Study:  NPO Temperature Spikes Noted: No Respiratory Status: Nasal cannula History of Recent Intubation: Yes Length of Intubations (days): 12 days (across 2 intubations) Date extubated: 10/22/16 Behavior/Cognition: Alert;Cooperative;Requires cueing Oral Cavity Assessment: Dry Oral Care Completed by SLP: No Patient Positioning: Upright in chair Baseline Vocal Quality: Hoarse;Low vocal intensity Volitional Cough: Weak;Congested    Oral/Motor/Sensory Function Overall Oral Motor/Sensory Function: Within functional limits   Ice Chips Ice chips: Impaired Presentation: Spoon Pharyngeal Phase Impairments: Cough - Immediate   Thin Liquid Thin Liquid: Impaired Presentation: Spoon Pharyngeal  Phase Impairments: Cough - Immediate    Nectar Thick Nectar Thick Liquid: Not tested   Honey Thick Honey Thick Liquid: Not tested   Puree Puree: Not tested   Solid   GO   Solid: Not tested        Joy Butler 10/23/2016,10:57 AM  Joy Butler, M.A. CCC-SLP [phone]

## 2016-10-23 NOTE — Care Management Important Message (Signed)
Important Message  Patient Details  Name: Joy Butler MRN: [id number] Date of Birth: [birth year]   Medicare Important Message Given:  Yes    [name] May 10/23/2016, 10:19 AM

## 2016-10-23 NOTE — Progress Notes (Signed)
ANTICOAGULATION CONSULT NOTE - Follow Up Consult  Pharmacy Consult for Heparin Indication: History of pulmonary embolus  Allergies  Allergen Reactions  . Keflex [Cephalexin] Itching and Swelling    Lips and eyes swelled up  . Clindamycin/Lincomycin Itching  . Latex Itching  . Sulfa Antibiotics Rash    Patient Measurements: Height: 5\' 9"  (175.3 cm) Weight: 200 lb 6.4 oz (90.9 kg) IBW/kg (Calculated) : 66.2 Heparin Dosing Weight: 86 kg  Vital Signs: Temp: 97.4 F (36.3 C) (11/02 2317) Temp Source: Oral (11/02 2317) BP: 143/90 (11/02 2300) Pulse Rate: 100 (11/02 2300)  Labs:  Recent Labs  10/20/16 0653  10/20/16 2254 10/21/16 0228  10/22/16 0224 10/22/16 0228 10/22/16 1200 10/22/16 2301  HGB 10.4*  --   --  10.8*  --  9.2*  --   --   --   HCT 32.2*  --   --  32.5*  --  28.9*  --   --   --   PLT 616*  --   --  596*  --  675*  --   --   --   LABPROT 22.5*  --   --  23.6*  --  23.1*  --   --   --   INR 1.95  --   --  2.07  --  2.01  --   --   --   HEPARINUNFRC 0.97*  < >  --  0.81*  < >  --  0.28* <0.10* 0.60  CREATININE 3.57*  --  3.30*  --   --  2.83*  --   --   --   < > = values in this interval not displayed.  Estimated Creatinine Clearance: 23.2 mL/min (by C-G formula based on SCr of 2.83 mg/dL (H)).  Assessment: 65 yo female w/ history of PE on Xarelto PTA. Pt was reportedly noncompliant with Xarelto so transitioned to warfarin w/ heparin bridge on admission. Given potential need for trach, warfarin discontinued and heparin drip continued alone. Heparin level labile. Heparin level back to therapeutic (0.6) on gtt at 1400 units/hr. No bleeding noted.  Goal of Therapy:  Heparin level 0.3-0.7 units/ml Monitor platelets by anticoagulation protocol: Yes   Plan:  -Continue heparin at 1400 units/hr -Daily heparin level and CBC  Sherlon Handing, PharmD, BCPS Clinical pharmacist, pager [phone] 10/23/16 12:41 AM

## 2016-10-23 NOTE — Progress Notes (Signed)
Patient to be transferred to stepdown. ICU will still see patient the morning of 10/24/2016 (overlap). Talked to [facility] hospitalist, Dr. Sherral Hammers.

## 2016-10-23 NOTE — Progress Notes (Signed)
ANTICOAGULATION CONSULT NOTE - Follow Up Consult  Pharmacy Consult for Heparin Indication: History of pulmonary embolus  Allergies  Allergen Reactions  . Keflex [Cephalexin] Itching and Swelling    Lips and eyes swelled up  . Clindamycin/Lincomycin Itching  . Latex Itching  . Sulfa Antibiotics Rash    Patient Measurements: Height: 5\' 9"  (175.3 cm) Weight: 200 lb 13.4 oz (91.1 kg) IBW/kg (Calculated) : 66.2 Heparin Dosing Weight: 86 kg  Vital Signs: Temp: 97.3 F (36.3 C) (11/03 0811) Temp Source: Oral (11/03 0811) BP: 169/89 (11/03 0500) Pulse Rate: 102 (11/03 0600)  Labs:  Recent Labs  10/20/16 2254  10/21/16 0228  10/22/16 0224  10/22/16 1200 10/22/16 2301 10/23/16 0536  HGB  --   < > 10.8*  --  9.2*  --   --   --  10.6*  HCT  --   --  32.5*  --  28.9*  --   --   --  32.3*  PLT  --   --  596*  --  675*  --   --   --  711*  LABPROT  --   --  23.6*  --  23.1*  --   --   --  19.7*  INR  --   --  2.07  --  2.01  --   --   --  1.65  HEPARINUNFRC  --   --  0.81*  < >  --   < > <0.10* 0.60 0.63  CREATININE 3.30*  --   --   --  2.83*  --   --   --  2.85*  < > = values in this interval not displayed.  Estimated Creatinine Clearance: 23 mL/min (by C-G formula based on SCr of 2.85 mg/dL (H)).  Assessment: 65 yo female w/ history of PE on Xarelto PTA. Pt was reportedly noncompliant with Xarelto so transitioned to warfarin w/ heparin bridge on admission. Given potential need for trach, warfarin discontinued and heparin drip continued alone. Heparin level labile. Heparin level therapeutic (0.63) on gtt at 1400 units/hr. No bleeding noted.  Goal of Therapy:  Heparin level 0.3-0.7 units/ml Monitor platelets by anticoagulation protocol: Yes   Plan:  -Continue heparin at 1400 units/hr -Daily heparin level and CBC -Monitor for s/s bleeding   Argie Ramming, PharmD Pharmacy Resident  Pager [phone] 10/23/16 8:47 AM

## 2016-10-23 NOTE — Progress Notes (Signed)
ANTICOAGULATION CONSULT NOTE - Follow Up Consult  Pharmacy Consult for Heparin transitioned to Lovenox  Indication: History of pulmonary embolus  Allergies  Allergen Reactions  . Keflex [Cephalexin] Itching and Swelling    Lips and eyes swelled up  . Clindamycin/Lincomycin Itching  . Latex Itching  . Sulfa Antibiotics Rash    Patient Measurements: Height: 5\' 9"  (175.3 cm) Weight: 200 lb 13.4 oz (91.1 kg) IBW/kg (Calculated) : 66.2 Heparin Dosing Weight: 86 kg  Vital Signs: Temp: 97.3 F (36.3 C) (11/03 0811) Temp Source: Oral (11/03 0811) BP: 153/122 (11/03 1000) Pulse Rate: 101 (11/03 1000)  Labs:  Recent Labs  10/20/16 2254  10/21/16 0228  10/22/16 0224  10/22/16 1200 10/22/16 2301 10/23/16 0536  HGB  --   < > 10.8*  --  9.2*  --   --   --  10.6*  HCT  --   --  32.5*  --  28.9*  --   --   --  32.3*  PLT  --   --  596*  --  675*  --   --   --  711*  LABPROT  --   --  23.6*  --  23.1*  --   --   --  19.7*  INR  --   --  2.07  --  2.01  --   --   --  1.65  HEPARINUNFRC  --   --  0.81*  < >  --   < > <0.10* 0.60 0.63  CREATININE 3.30*  --   --   --  2.83*  --   --   --  2.85*  < > = values in this interval not displayed.  Estimated Creatinine Clearance: 23 mL/min (by C-G formula based on SCr of 2.85 mg/dL (H)).  Assessment: 65 yo female w/ history of PE on Xarelto PTA. Pt was reportedly noncompliant with Xarelto so transitioned to warfarin w/ heparin bridge on admission. Given potential need for trach, warfarin discontinued and heparin drip continued alone. To improve fluid status, will transition from to heparin gtt to treatment dose lovenox.   Goal of Therapy:  Anti-Xa level 0.6-1 units/ml 4hrs after LMWH dose given Monitor platelets by anticoagulation protocol: Yes   Plan:  -D/C heparin gtt -Start lovenox 1mg /kg q24hr  -CBC every 3 days  -Monitor for s/s bleeding   Argie Ramming, PharmD Pharmacy Resident  Pager [phone] 10/23/16 10:50 AM

## 2016-10-23 NOTE — Progress Notes (Signed)
Physical Therapy Treatment Patient Details Name: Joy Butler MRN: [id number] DOB: Oct 12, 1951 Today's Date: 10/23/2016    History of Present Illness 65 yo admitted with CAP with VDRF 10/22-10/29 with reintubation same day, bronchoscopy 10/31. PMhx: arthritis, back pain, DM, neuropathy, HTN, falls, PE    PT Comments    Pt pleasant and able to speak softly off the vent today. Pt reports back pain as problematic for her but decreased pain after transition to chair. Pt with increased weakness and need for assist today. Pt unable to walk and difficulty rising from higher surface. Pt encouraged to perform HEP and to be transferred to chair daily with nursing. Will continue to follow to maximize function and continue to recommend CIR.   sats 100% on 14L HFNC HR 104 BP 146/98   Follow Up Recommendations  CIR;Supervision/Assistance - 24 hour     Equipment Recommendations       Recommendations for Other Services OT consult;Rehab consult     Precautions / Restrictions Precautions Precautions: Fall Restrictions Weight Bearing Restrictions: No    Mobility  Bed Mobility Overal bed mobility: Needs Assistance Bed Mobility: Supine to Sit     Supine to sit: Mod assist;HOB elevated;+2 for safety/equipment     General bed mobility comments: increased time with assist to fully pivot legs and elevate trunk, cues for sequence  Transfers Overall transfer level: Needs assistance   Transfers: Sit to/from Stand Sit to Stand: Max assist;From elevated surface;+2 physical assistance         General transfer comment: pt required bed height elevated, assist for knee extension with knee blocked, anteiror translation and rise x 4 trials with pt able to stand max 20 sec today, unable to shift weight or take steps. Semi standing for pivot to chair with max assist to control and move pelvis  Ambulation/Gait             General Gait Details: unable today   Stairs             Wheelchair Mobility    Modified Rankin (Stroke Patients Only)       Balance Overall balance assessment: Needs assistance   Sitting balance-Leahy Scale: Poor Sitting balance - Comments: pt required min assist x 5 min EOB for balance with multimodal cues Postural control: Posterior lean                          Cognition Arousal/Alertness: Awake/alert Behavior During Therapy: WFL for tasks assessed/performed Overall Cognitive Status: Impaired/Different from baseline Area of Impairment: Memory;Safety/judgement     Memory: Decreased short-term memory   Safety/Judgement: Decreased awareness of deficits     General Comments: continues to ask for drinks despite education that she hasnt been cleared yet, decreased awareness of impaired balance    Exercises General Exercises - Lower Extremity Long Arc Quad: AAROM;Both;15 reps;Seated Hip ABduction/ADduction: AAROM;Both;15 reps;Seated Hip Flexion/Marching: AAROM;Both;15 reps;Seated Toe Raises: AROM;Both;15 reps;Seated    General Comments        Pertinent Vitals/Pain Pain Assessment: 0-10 Pain Score: 3  Pain Location: back Pain Descriptors / Indicators: Aching Pain Intervention(s): Limited activity within patient's tolerance;Repositioned;Monitored during session    Home Living                      Prior Function            PT Goals (current goals can now be found in the care plan section) Progress towards PT goals:  Progressing toward goals    Frequency    Min 3X/week      PT Plan Current plan remains appropriate    Co-evaluation             End of Session Equipment Utilized During Treatment: Gait belt;Oxygen Activity Tolerance: Patient tolerated treatment well Patient left: in chair;with call bell/phone within reach;with chair alarm set     Time: [id number] PT Time Calculation (min) (ACUTE ONLY): 26 min  Charges:  $Therapeutic Exercise: 8-22 mins $Therapeutic Activity: 8-22  mins                    G Codes:      Melford Aase 2016/11/13, 9:48 AM  Elwyn Reach, [place]

## 2016-10-23 NOTE — Progress Notes (Signed)
PULMONARY / CRITICAL CARE MEDICINE   Name: Joy Butler MRN: [id number] DOB: 01-10-1951    ADMISSION DATE:  10/10/2016 CONSULTATION DATE:  10/11/16   REFERRING MD:  Dr. Reesa Chew / IMTS   CHIEF COMPLAINT:  Multi-focal PNA / Acute Respiratory Failure   Brief:  [name], occasional cigarette smoking, with PMH of arthritis, cataracts, chronic back pain, migraines, depression, HLD, HTN, splenic infarct, DM II, PE (2011, on xarelto with intermittent compliance) who presented to [facility] on 10/22 with reports of lower extremity pain and shortness of breath. The patient also reported a possible syncopal episode.  She presented to the ER via EMS. Per report, she had a memory of the fall. EMS found the patient to be hypoxic with room air saturations of 80%. She does not wear home oxygen at baseline. She was placed on a nonrebreather with improvement to 90%. The patient reported development of a cough, weakness and "not feeling well" one week prior to presentation. Initial ER evaluation found her to have increased work of breathing on exam with saturations in the 70s on room air. She had a mild elevation of lactic acid at 2.84 and rectal temperature of 100.8. The patient was found to have a chest x-ray concerning for multifocal pneumonia. Initially she was not hypotensive. However, later in the morning the patient developed hypotension and increased work of breathing. ABG evaluation demonstrated worsening acidosis. Initial labs-NA 138, K3.3, creatinine 3.85 (up from 2.46), bun 58, anion gap 14, alkaline phosphatase 123, albumin 1.6, BNP 284, troponin 0.07, WBC 18.7, hemoglobin 13.1, and platelets 397.  She felt nausea prior to intubation and vomited (the patient was able to notify staff, sat upright and secretions cleared via suction). Due to bilateral opacities, hypoxic respiratory failure and developing hypotension the decision was made to intubate the patient for airway protection.  PCCM called for ICU  admission.  STUDIES:  ECHO 10/22: LVEF 40-45% with mild LDH and G1DD (worse compared to EF of 50-55% 08/2012) CT Chest 10/22 >> extensive bilateral consolidative changes consistent with multifocal pneumonia, mild cardiomegaly with multivessel coronary disease  CULTURES: Influenza 10/22: negative BCx2 10/22 >> strep pneumo UA 10/22: proteinuria, small hgb, moderate bilirubin, negative nitrites and leukocytes; rbc 0-5 on add-on UC 10/22: < 10,000 colonies Strep pneumo urinary Ag 10/22: positive Respiratory panel 10/22: Negative  ANTIBIOTICS: Vanco 10/22 >> 10/24, 10/31>> Levaquin 10/22 >> 10/24 PRIMAXIN 10/31>>   LINES/TUBES: ETT 10/22 >> 10/29>Resp secretion>10/29>>11/02 PIV x 2>> Foley 10/22 >>   SIGNIFICANT EVENTS: 10/22  Admit per IMTS with PNA, failed BiPAP & required intubation  11/1: Walked without complications.  11/2: successfully extubated to 4L by [place].   Subjective:  Patient failed her swallow assessment yesterday. She was started on Venturi mask with FiO2 of 55% over night for stridor. She is on high flow nasal canula at 10L and 55%. She complains about low back pain. No other complaints.   VITAL SIGNS: BP (!) 147/100   Pulse 100   Temp 97.2 F (36.2 C) (Oral)   Resp (!) 28   Ht 5\' 9"  (1.753 m)   Wt 90.9 kg (200 lb 6.4 oz)   SpO2 97%   BMI 29.59 kg/m   HEMODYNAMICS:  Stable  VENTILATOR SETTINGS: Vent Mode: CPAP FiO2 (%):  [40 %-55 %] 55 % PEEP:  [5 cmH20] 5 cmH20 Pressure Support:  [5 cmH20] 5 cmH20  INTAKE / OUTPUT: I/O last 3 completed shifts: In: 5770.4 [I.V.:3475.4; NG/GT:1595; IV [id number] Out: [id number]  PHYSICAL  EXAMINATION: General:  Chronically ill appearing female, NAD, lying in bed Neuro:  answers to questions and following commands HEENT:  MM pink/dry, no jvd Cardiovascular:  s1s2 rrr, no m/r/g, diminished DP and PT pulses in LE's Lungs: upper air sounds, improved  Abdomen:  Obese/soft, no TTP, bsx4 active   Musculoskeletal:  No acute deformities, BLE symmetrical  Skin:  Cold extremity, no edema  LABS:  BMET  Recent Labs Lab 10/20/16 2254 10/22/16 0224 10/23/16 0536  NA 145 144 146*  K 4.1 3.2* 4.7  CL 113* 112* 116*  CO2 18* 21* 21*  BUN 159* 136* 125*  CREATININE 3.30* 2.83* 2.85*  GLUCOSE 343* 263* 168*    Electrolytes  Recent Labs Lab 10/18/16 0420 10/19/16 0456  10/20/16 0653 10/20/16 2254 10/22/16 0224 10/22/16 0727 10/23/16 0536  CALCIUM 8.4* 8.6*  < > 8.8* 8.6* 8.2*  --  8.8*  MG 2.1 2.4  --  2.3  --   --  1.8  --   PHOS 4.6 6.0*  --  5.2*  --   --   --   --   < > = values in this interval not displayed. CBC  Recent Labs Lab 10/21/16 0228 10/22/16 0224 10/23/16 0536  WBC 21.1* 17.0* 20.8*  HGB 10.8* 9.2* 10.6*  HCT 32.5* 28.9* 32.3*  PLT 596* 675* 711*   Coag's  Recent Labs Lab 10/21/16 0228 10/22/16 0224 10/23/16 0536  INR 2.07 2.01 1.65   Sepsis Markers No results for input(s): LATICACIDVEN, PROCALCITON, O2SATVEN in the last 168 hours. ABG  Recent Labs Lab 10/18/16 0440 10/18/16 0455  PHART TEST WILL BE CREDITED 7.507*  PCO2ART TEST WILL BE CREDITED 32.2  PO2ART TEST WILL BE CREDITED 82.7*   Liver Enzymes No results for input(s): AST, ALT, ALKPHOS, BILITOT, ALBUMIN in the last 168 hours. Cardiac Enzymes No results for input(s): TROPONINI, PROBNP in the last 168 hours. Glucose  Recent Labs Lab 10/22/16 1538 10/22/16 1624 10/22/16 1833 10/22/16 1941 10/22/16 2320 10/23/16 0334  GLUCAP 64* 91 99 92 102* 124*   Imaging No results found. S DISCUSSION: [name] with PMH of PE (non-compliant with xarelto), HTN, CKD, HLD admitted 10/22 per IMTS with multifocal PNA.  Developed worsening respiratory failure requiring intubation in ER. Found to have Strep pneumo bacteremia. Extubated 10/29 but reintubated for respiratory secretion.   ASSESSMENT / PLAN:  PULMONARY A: Acute Hypoxic Respiratory Failure - in the setting of  multi-focal PNA  Multi-focal PNA - R>L opacities  Hx PE - on Xarelto, non-compliant with anticoagulation  Strep pneumo positive with urine Ag testing Net neg 1.0 P:   IS per RT protocol HFNC. Titrate oxygen for O2 > 92% Trend CXR - stable See ID section S/p 7 doses solumedrol Duonebs q6h  CARDIOVASCULAR A:  Septic Shock - in setting of multifocal PNA Syncopal Episode - prior to admit Hx HTN, HLD LVEF 40-45% with mild LDH and G1DD (worse compared to EF of 50-55% 08/2012) P:  ICU monitoring of hemodynamics  D/Ced pressors Continue home norvasc Consider BB given HFrEF Lopressor 2.5 mg q3h prn  RENAL A:   Acute on Chronic Kidney Injury  - improving. sCr 2.85. b/l~ 2.4 Hypokalemia - resolved Pseudo-hypocalcemia - corrects normal (8.8) for hypoalbuminemia Hypernatremia At least CKD IIIB given labs earlier this year Renal function worse but stable Free water deficit of 1.8 L P:   Trend BMP/UOP  KVO IVF given hyperchloremia Replace electrolytes as indicated. 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 S/p K P:  Trend CBC Dcd coumadin 10/30 Hep drip PT/INR pending  INFECTIOUS A:   Multifocal PNA  Leukocytosis not resolved --> 19.8 > 18.6 > 21.4 > 17.0>21 P:   Follow pan cultures  Abx as above, note allergies (keflex - facial swelling, clinda, sulfa) Trend PCT down 47 on 10/24 Vanc 10/22>>10/24, 10/31>> Levaquin 10/22>10/24 PRIMAXIN 10/31>>  ENDOCRINE A:   DM - last hgb A1c 6.2 on 07/06/16   Am CBG 247 P:   SSI-renal Hold lantus pending feeding/?NGT CBGs q4h  NEUROLOGIC A:   Chronic Back Pain Migraines   P:   RASS goal: 0 to -1  Fentanyl PRN for pain CIR consulted   FAMILY  - Updates: No  family at bedside. Will update after round.  - Talked to sister with patient's permission on 11/2  Wendee Beavers, MD. [facility], PGY-2  10/23/2016 6:31 AM

## 2016-10-23 NOTE — Progress Notes (Signed)
Pt received from 67M per bed Alert but confused bed alarm placed on

## 2016-10-23 NOTE — Consult Note (Signed)
Physical Medicine and Rehabilitation Consult Reason for Consult: Debilitation/acute hypoxic respiratory failure Referring Physician: Critical care   HPI: Joy Butler is a 65 y.o. 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 Per chart review patient lives with spouse. Used a cane and walker prior to admission. One level home with 3 steps to entry. There has been reports of multiple falls in the past. Presented 10/11/2016 with progressive shortness of breath, nonproductive cough with associated right sided chest pain 1 week. Patient required intubation. CT of the chest showed extensive bilateral airspace consolidative changes most compatible with multifocal pneumonia. Placed on broad-spectrum antibiotics. Cranial CT scan for altered mental status showed chronic changes no acute abnormality. Troponin 2.63, lactic acid 2.2, creatinine 3.84 with baseline creatinine 1.68, potassium 3.3. Echocardiogram with ejection fraction [id number] grade 1 diastolic dysfunction. Patient was diuresed with monitoring renal function and latest creatinine 2.85. Extubated 10/22/2016. Physical therapy evaluation completed 10/21/2016 with recommendations of physical medicine rehabilitation consult.  Review of Systems  Unable to perform ROS: Acuity of condition   Past Medical History:  Diagnosis Date  . Arthritis    "knees" (07/26/2014)  . Cataract of both eyes   . Chronic back pain   . Daily headache   . Depression   . High cholesterol   . Hypertension   . Hypertensive emergency 12/03/2015  . Kidney stones   . Migraine    "last one was in the 1990's" (07/26/2014)  . Neuropathy (HCC)    feet  . Pneumonia    "several times" (07/26/2014)  . Pulmonary embolism ([place])    2011 treated at [facility] in [place].  Was on Coumadin for over a year.  no known family history  . Splenic infarction    On CT scan  09/2012  . Type II diabetes mellitus ([place]) dx'd 2000  . UTI (urinary tract infection)    Past Surgical History:  Procedure Laterality Date  . APPENDECTOMY    . ARTERY BIOPSY Right 05/09/2014   Procedure: BIOPSY TEMPORAL ARTERY;  Surgeon: Rosetta Posner, MD;  Location: [place];  Service: Vascular;  Laterality: Right;  . BREAST BIOPSY Left   . CARPAL TUNNEL RELEASE Bilateral   . CESAREAN SECTION  1982  . CHOLECYSTECTOMY     1980  . COLONOSCOPY WITH PROPOFOL N/A 12/06/2015   Procedure: COLONOSCOPY WITH PROPOFOL;  Surgeon: Wilford Corner, MD;  Location: [facility] ENDOSCOPY;  Service: Endoscopy;  Laterality: N/A;  . CYSTOSCOPY W/ STONE MANIPULATION    . DILATION AND CURETTAGE OF UTERUS  1982  . ESOPHAGOGASTRODUODENOSCOPY  08/19/2012   Procedure: ESOPHAGOGASTRODUODENOSCOPY (EGD);  Surgeon: Winfield Cunas., MD;  Location: [facility] ENDOSCOPY;  Service: Endoscopy;  Laterality: N/A;  . ESOPHAGOGASTRODUODENOSCOPY (EGD) WITH PROPOFOL N/A 12/06/2015   Procedure: ESOPHAGOGASTRODUODENOSCOPY (EGD) WITH PROPOFOL;  Surgeon: Wilford Corner, MD;  Location: [facility] ENDOSCOPY;  Service: Endoscopy;  Laterality: N/A;  . EYE SURGERY Bilateral   . INCISION AND DRAINAGE ABSCESS N/A 03/28/2016   Procedure: INCISION, DRAINAGE  AND DEBRIDEMENT BACK  ABSCESS;  Surgeon: Greer Pickerel, MD;  Location: [place];  Service: General;  Laterality: N/A;  . REFRACTIVE SURGERY Bilateral   . TEMPORAL ARTERY BIOPSY / LIGATION Right 04/2014  . TONSILLECTOMY    . VAGINAL HYSTERECTOMY     Family History  Problem Relation Age of Onset  . Adopted: Yes  . Hypertension Mother    Social History:  reports  that she has been smoking Cigarettes.  She has a 11.00 pack-year smoking history. She has never used smokeless tobacco. She reports that she does not drink alcohol or use drugs. Allergies:  Allergies  Allergen Reactions  . Keflex [Cephalexin] Itching and Swelling    Lips and eyes swelled up  . Clindamycin/Lincomycin Itching  . Latex Itching  .  Sulfa Antibiotics Rash   Medications Prior to Admission  Medication Sig Dispense Refill  . amLODipine (NORVASC) 10 MG tablet Take 1 tablet (10 mg total) by mouth daily. 90 tablet 3  . ferrous sulfate 325 (65 FE) MG tablet Take 1 tablet (325 mg total) by mouth daily. 30 tablet 3  . insulin NPH-regular Human (NOVOLIN 70/30) (70-30) 100 UNIT/ML injection Inject 25 Units into the skin 2 (two) times daily with a meal. 30 mL 1  . loratadine (CLARITIN) 10 MG tablet TAKE 1 TABLET BY MOUTH ONCE DAILY 30 tablet 1  . losartan-hydrochlorothiazide (HYZAAR) 100-25 MG tablet TAKE 1 TABLET BY MOUTH EVERY DAY 90 tablet 1  . pravastatin (PRAVACHOL) 40 MG tablet TAKE 1 TABLET BY MOUTH EVERY EVENING 90 tablet 0  . Rivaroxaban (XARELTO) 15 MG TABS tablet Take 1 tablet (15 mg total) by mouth daily with supper. Please counsel on adherence 60 tablet 3    Home: Home Living Family/patient expects to be discharged to:: Private residence Living Arrangements: Spouse/significant other Available Help at Discharge: Family, Available 24 hours/day Type of Home: House Home Access: Stairs to enter [employer] of Steps: 3 Entrance Stairs-Rails: Right, Left, Can reach both Home Layout: One level Bathroom Shower/Tub: Tub/shower unit, Architectural technologist: Standard Home Equipment: [employer] - single point, Civil engineer, contracting, Environmental consultant - 2 wheels, Bedside commode  Functional History: Prior Function Level of Independence: Needs assistance Gait / Transfers Assistance Needed: ambulated with RW for shorter distances ADL's / Homemaking Assistance Needed: assist for housekeeping, assist for bathing and at times assist for dressing lower body Comments: using RW, multi falls. State Functional Status:  Mobility: Bed Mobility Overal bed mobility: Needs Assistance Bed Mobility: Supine to Sit Supine to sit: Min assist General bed mobility comments: cues for sequence with increased time and assist to elevate trunk from  surface Transfers Overall transfer level: Needs assistance Transfers: Sit to/from Stand Sit to Stand: Mod assist, +2 physical assistance, +2 safety/equipment, From elevated surface General transfer comment: pt unable to rise from bed in lowest position and with elevated able to rise with assist for anterior translation and powering up Ambulation/Gait Ambulation/Gait assistance: +2 safety/equipment, Mod assist Ambulation Distance (Feet): 8 Feet Assistive device: Rolling walker (2 wheeled) Gait Pattern/deviations: Step-through pattern, Decreased stride length, Trunk flexed General Gait Details: cues for posture, position in RW, safety with RT present to control vent and nurse for chair. 2 person assist with gait for balance, RW position and safety Gait velocity interpretation: Below normal speed for age/gender    ADL:    Cognition: Cognition Overall Cognitive Status: Difficult to assess Orientation Level: (P) Disoriented to place, Disoriented to time, Disoriented to situation Cognition Arousal/Alertness: Awake/alert Behavior During Therapy: WFL for tasks assessed/performed Overall Cognitive Status: Difficult to assess Difficult to assess due to: Intubated  Blood pressure (!) 169/89, pulse (!) 102, temperature 97.2 F (36.2 C), temperature source Oral, resp. rate (!) 25, height 5\' 9"  (1.753 m), weight 90.9 kg (200 lb 6.4 oz), SpO2 97 %. Physical Exam  Vitals reviewed. Constitutional: She appears well-developed. She appears distressed.  65 year old obese female  HENT:  Head: Normocephalic and  atraumatic.  Eyes: EOM are normal.  Pupils sluggish to light  Neck: Normal range of motion. Neck supple. No thyromegaly present.  Cardiovascular: Regular rhythm.   Tachycardia  Respiratory:  Decreased breath sounds at the bases with limited inspiratory effort Increased WOB [place]  GI: Soft. Bowel sounds are normal. She exhibits no distension.  Musculoskeletal: She exhibits no edema or  tenderness.  Neurological: She is alert.  Right lean Able to mouth short words with breaks due to WOB Sensation intact to light touch LUE tremor Motor: 4--4/5 throughout  Skin: Skin is warm and dry.  Psychiatric:  Unable to assess due to respiratory distress    Results for orders placed or performed during the hospital encounter of 10/10/16 (from the past 24 hour(s))  Magnesium     Status: None   Collection Time: 10/22/16  7:27 AM  Result Value Ref Range   Magnesium 1.8 1.7 - 2.4 mg/dL  Glucose, capillary     Status: Abnormal   Collection Time: 10/22/16  8:20 AM  Result Value Ref Range   Glucose-Capillary 274 (H) 65 - 99 mg/dL   Comment 1 Notify RN    Comment 2 Document in Chart   Glucose, capillary     Status: Abnormal   Collection Time: 10/22/16 11:53 AM  Result Value Ref Range   Glucose-Capillary 138 (H) 65 - 99 mg/dL   Comment 1 Notify RN    Comment 2 Document in Chart   Heparin level (unfractionated)     Status: Abnormal   Collection Time: 10/22/16 12:00 PM  Result Value Ref Range   Heparin Unfractionated <0.10 (L) 0.30 - 0.70 IU/mL  Glucose, capillary     Status: Abnormal   Collection Time: 10/22/16  3:38 PM  Result Value Ref Range   Glucose-Capillary 64 (L) 65 - 99 mg/dL   Comment 1 Notify RN    Comment 2 Document in Chart   Glucose, capillary     Status: None   Collection Time: 10/22/16  4:24 PM  Result Value Ref Range   Glucose-Capillary 91 65 - 99 mg/dL   Comment 1 Document in Chart   Glucose, capillary     Status: None   Collection Time: 10/22/16  6:33 PM  Result Value Ref Range   Glucose-Capillary 99 65 - 99 mg/dL   Comment 1 Document in Chart   Glucose, capillary     Status: None   Collection Time: 10/22/16  7:41 PM  Result Value Ref Range   Glucose-Capillary 92 65 - 99 mg/dL   Comment 1 Notify RN    Comment 2 Document in Chart   Heparin level (unfractionated)     Status: None   Collection Time: 10/22/16 11:01 PM  Result Value Ref Range   Heparin  Unfractionated 0.60 0.30 - 0.70 IU/mL  Glucose, capillary     Status: Abnormal   Collection Time: 10/22/16 11:20 PM  Result Value Ref Range   Glucose-Capillary 102 (H) 65 - 99 mg/dL   Comment 1 Notify RN    Comment 2 Document in Chart   Glucose, capillary     Status: Abnormal   Collection Time: 10/23/16  3:34 AM  Result Value Ref Range   Glucose-Capillary 124 (H) 65 - 99 mg/dL   Comment 1 Notify RN    Comment 2 Document in Chart   Protime-INR     Status: Abnormal   Collection Time: 10/23/16  5:36 AM  Result Value Ref Range   Prothrombin Time 19.7 (H) 11.4 -  15.2 seconds   INR 1.65   Heparin level (unfractionated)     Status: None   Collection Time: 10/23/16  5:36 AM  Result Value Ref Range   Heparin Unfractionated 0.63 0.30 - 0.70 IU/mL  CBC     Status: Abnormal   Collection Time: 10/23/16  5:36 AM  Result Value Ref Range   WBC 20.8 (H) 4.0 - 10.5 K/uL   RBC 3.86 (L) 3.87 - 5.11 MIL/uL   Hemoglobin 10.6 (L) 12.0 - 15.0 g/dL   HCT 32.3 (L) 36.0 - 46.0 %   MCV 83.7 78.0 - 100.0 fL   MCH 27.5 26.0 - 34.0 pg   MCHC 32.8 30.0 - 36.0 g/dL   RDW 17.9 (H) 11.5 - 15.5 %   Platelets 711 (H) 150 - 400 K/uL  Basic metabolic panel     Status: Abnormal   Collection Time: 10/23/16  5:36 AM  Result Value Ref Range   Sodium 146 (H) 135 - 145 mmol/L   Potassium 4.7 3.5 - 5.1 mmol/L   Chloride 116 (H) 101 - 111 mmol/L   CO2 21 (L) 22 - 32 mmol/L   Glucose, Bld 168 (H) 65 - 99 mg/dL   BUN 125 (H) 6 - 20 mg/dL   Creatinine, Ser 2.85 (H) 0.44 - 1.00 mg/dL   Calcium 8.8 (L) 8.9 - 10.3 mg/dL   GFR calc non Af Amer 16 (L) >60 mL/min   GFR calc Af Amer 19 (L) >60 mL/min   Anion gap 9 5 - 15   No results found.  Assessment/Plan: Diagnosis:  Debilitation/acute hypoxic respiratory failure/PNA Labs and images independently reviewed.  Records reviewed and summated above.  1. Does the need for close, 24 hr/day medical supervision in concert with the patient's rehab needs make it unreasonable  for this patient to be served in a less intensive setting? Yes  2. Co-Morbidities requiring supervision/potential complications: chronic back pain (Biofeedback training with therapies to help reduce reliance on opiate pain medications -on IV fentanyl, monitor pain control during therapies, and sedation at rest and titrate to maximum efficacy to ensure participation and gains in therapies), diabetes mellitus with labile CBGs (Monitor in accordance with exercise and adjust meds as necessary), recurrent pulmonary emboli (on Heparin GGT, transition as appropriate), medical noncompliance (counsel and educate), Combined CHF (Monitor in accordance with increased physical activity and avoid UE resistance excercises), multifocal pneumonia, Tachycardia (monitor in accordance with pain and increasing activity), tachypnea (monitor RR and O2 Sats with increased physical exertion), HTN (monitor and provide prns in accordance with increased physical exertion and pain), hypernatremia (cont to monitor, treat as necessary), hypokalemia (continue to monitor and replete as necessary), AKI on CKD (avoid nephrotoxic meds), leukocytosis (cont to monitor for signs and symptoms of infection, further workup if indicated), ABLA (transfuse if necessary to ensure appropriate perfusion for increased activity tolerance) 3. Due to bladder management, bowel management, safety, skin/wound care, disease management, medication administration, pain management and patient education, does the patient require 24 hr/day rehab nursing? Yes 4. Does the patient require coordinated care of a physician, rehab nurse, PT (1-2 hrs/day, 5 days/week), OT (1-2 hrs/day, 5 days/week) and SLP (1-2 hrs/day, 5 days/week) to address physical and functional deficits in the context of the above medical diagnosis(es)? Yes Addressing deficits in the following areas: balance, endurance, locomotion, strength, transferring, bowel/bladder control, bathing, dressing, feeding,  grooming, toileting, speech, swallowing and psychosocial support 5. Can the patient actively participate in an intensive therapy program of at least 3  hrs of therapy per day at least 5 days per week? Not at present 6. The potential for patient to make measurable gains while on inpatient rehab is excellent 7. Anticipated functional outcomes upon discharge from inpatient rehab are min assist  with PT, min assist with OT, independent and modified independent with SLP. 8. Estimated rehab length of stay to reach the above functional goals is: 14-18 days. 9. Does the patient have adequate social supports and living environment to accommodate these discharge functional goals? Potentially 10. Anticipated D/C setting: Home 11. Anticipated post D/C treatments: HH therapy and Home excercise program 12. Overall Rehab/Functional Prognosis: good  RECOMMENDATIONS: This patient's condition is appropriate for continued rehabilitative care in the following setting: If adequate caregiver support available, recommend CIR when medically stable and demonstartes ability to tolerate 3 hours therapy/day.   Patient has agreed to participate in recommended program. Potentially Note that insurance prior authorization may be required for reimbursement for recommended care.  Comment: Rehab Admissions Coordinator to follow up.  Delice Lesch, MD, [facility] Drown 10/23/2016

## 2016-10-24 ENCOUNTER — Inpatient Hospital Stay (HOSPITAL_COMMUNITY): Payer: Medicare Other

## 2016-10-24 DIAGNOSIS — Z7901 Long term (current) use of anticoagulants: Secondary | ICD-10-CM

## 2016-10-24 DIAGNOSIS — I129 Hypertensive chronic kidney disease with stage 1 through stage 4 chronic kidney disease, or unspecified chronic kidney disease: Secondary | ICD-10-CM

## 2016-10-24 DIAGNOSIS — A409 Streptococcal sepsis, unspecified: Secondary | ICD-10-CM

## 2016-10-24 DIAGNOSIS — E1122 Type 2 diabetes mellitus with diabetic chronic kidney disease: Secondary | ICD-10-CM

## 2016-10-24 DIAGNOSIS — E878 Other disorders of electrolyte and fluid balance, not elsewhere classified: Secondary | ICD-10-CM

## 2016-10-24 DIAGNOSIS — R1313 Dysphagia, pharyngeal phase: Secondary | ICD-10-CM

## 2016-10-24 DIAGNOSIS — E1165 Type 2 diabetes mellitus with hyperglycemia: Secondary | ICD-10-CM

## 2016-10-24 DIAGNOSIS — I2782 Chronic pulmonary embolism: Secondary | ICD-10-CM

## 2016-10-24 DIAGNOSIS — Z79899 Other long term (current) drug therapy: Secondary | ICD-10-CM

## 2016-10-24 DIAGNOSIS — R490 Dysphonia: Secondary | ICD-10-CM

## 2016-10-24 DIAGNOSIS — N183 Chronic kidney disease, stage 3 (moderate): Secondary | ICD-10-CM

## 2016-10-24 DIAGNOSIS — R1310 Dysphagia, unspecified: Secondary | ICD-10-CM

## 2016-10-24 DIAGNOSIS — D72829 Elevated white blood cell count, unspecified: Secondary | ICD-10-CM

## 2016-10-24 LAB — GLUCOSE, CAPILLARY
GLUCOSE-CAPILLARY: 152 mg/dL — AB (ref 65–99)
Glucose-Capillary: 173 mg/dL — ABNORMAL HIGH (ref 65–99)
Glucose-Capillary: 142 mg/dL — ABNORMAL HIGH (ref 65–99)
Glucose-Capillary: 174 mg/dL — ABNORMAL HIGH (ref 65–99)
Glucose-Capillary: 156 mg/dL — ABNORMAL HIGH (ref 65–99)
Glucose-Capillary: 140 mg/dL — ABNORMAL HIGH (ref 65–99)

## 2016-10-24 LAB — BASIC METABOLIC PANEL
Anion gap: 14 (ref 5–15)
BUN: 126 mg/dL — ABNORMAL HIGH (ref 6–20)
CHLORIDE: 116 mmol/L — AB (ref 101–111)
CO2: 20 mmol/L — AB (ref 22–32)
CREATININE: 2.98 mg/dL — AB (ref 0.44–1.00)
Calcium: 9.1 mg/dL (ref 8.9–10.3)
GFR calc non Af Amer: 15 mL/min — ABNORMAL LOW (ref >60)
GFR, EST AFRICAN AMERICAN: 18 mL/min — AB (ref >60)
Glucose, Bld: 183 mg/dL — ABNORMAL HIGH (ref 65–99)
Potassium: 4.4 mmol/L (ref 3.5–5.1)
Sodium: 150 mmol/L — ABNORMAL HIGH (ref 135–145)

## 2016-10-24 LAB — PROTIME-INR
INR: 1.49
Prothrombin Time: 18.2 seconds — ABNORMAL HIGH (ref 11.4–15.2)

## 2016-10-24 LAB — CBC
HCT: 37 % (ref 36.0–46.0)
Hemoglobin: 12 g/dL (ref 12.0–15.0)
MCH: 27.3 pg (ref 26.0–34.0)
MCHC: 32.4 g/dL (ref 30.0–36.0)
MCV: 84.1 fL (ref 78.0–100.0)
PLATELETS: 645 10*3/uL — AB (ref 150–400)
RBC: 4.4 MIL/uL (ref 3.87–5.11)
RDW: 18 % — ABNORMAL HIGH (ref 11.5–15.5)
WBC: 12.6 10*3/uL — ABNORMAL HIGH (ref 4.0–10.5)

## 2016-10-24 LAB — PHOSPHORUS: Phosphorus: 6.9 mg/dL — ABNORMAL HIGH (ref 2.5–4.6)

## 2016-10-24 LAB — MAGNESIUM: Magnesium: 2.1 mg/dL (ref 1.7–2.4)

## 2016-10-24 MED ORDER — FAMOTIDINE 20 MG PO TABS
20.0000 mg | ORAL_TABLET | Freq: Every day | ORAL | Status: DC
  Administered 2016-10-26 – 2016-11-05 (×11): 20 mg via ORAL
  Filled 2016-10-24 (×12): qty 1

## 2016-10-24 MED ORDER — DEXTROSE-NACL 5-0.45 % IV SOLN
INTRAVENOUS | Status: DC
  Administered 2016-10-24 – 2016-10-25 (×2): via INTRAVENOUS

## 2016-10-24 MED ORDER — DEXTROSE 10 % IV SOLN: INTRAVENOUS | Status: DC

## 2016-10-24 MED ORDER — DEXTROSE-NACL 5-0.45 % IV SOLN
INTRAVENOUS | Status: DC
  Administered 2016-10-24: 17:00:00 via INTRAVENOUS

## 2016-10-24 MED ORDER — DEXTROSE-NACL 5-0.45 % IV SOLN: INTRAVENOUS | Status: DC

## 2016-10-24 MED ORDER — LABETALOL HCL 5 MG/ML IV SOLN
10.0000 mg | INTRAVENOUS | Status: DC | PRN
  Administered 2016-10-24 – 2016-10-29 (×6): 10 mg via INTRAVENOUS
  Filled 2016-10-24 (×8): qty 4

## 2016-10-24 NOTE — Plan of Care (Signed)
Problem: Education: Goal: Knowledge of No Name General Education information/materials will improve Outcome: Progressing Discussed with husband and patient about nothing by mouth status with supervision to use ice chips during oral care with some teach back displayed.

## 2016-10-24 NOTE — Progress Notes (Signed)
Speech Language Pathology Treatment: Dysphagia  Patient Details Name: Joy Butler MRN: [id number] DOB: August 03, 1951 Today's Date: 10/24/2016 Time: 1005-1040 SLP Time Calculation (min) (ACUTE ONLY): 35 min  Assessment / Plan / Recommendation Clinical Impression  Patient seen to determine progress with swallow function and readiness for PO's. Patient has been asking frequently for water and asked SLP if she could have "a little bit of soda". Patient exhibited hoarse voice, delayed, non-productive cough with ice chips, immediate and prolonged cough with small sip of water. Patient continues to exhibit vocal quality that is indicative of poor vocal cord adduction, and continues to be a high aspiration risk. Recommendation for patient to be able to have small amount of ice chips following oral care and full supervision (patient is impulsive and does not follow directions).   HPI HPI: [name] with PMH of PE (non-compliant with xarelto), HTN, CKD, HLD admitted 10/22 per IMTS with multifocal PNA. Developed worsening respiratory failure requiring intubation 10/22-10/29 with reintubation 10/29-11/2.      SLP Plan  Continue with current plan of care     Recommendations  Diet recommendations: NPO;Other(comment) (ice chips after oral care and with full supervision) Medication Administration: Via alternative means Supervision: Full supervision/cueing for compensatory strategies                Oral Care Recommendations: Oral care QID Follow up Recommendations:  (TBA) Plan: Continue with current plan of care       Sonia Baller, MA, CCC-SLP 10/24/16 12:41 PM

## 2016-10-24 NOTE — Progress Notes (Addendum)
Transfer Notes.                                Ms. Thrall, 65 y. o lady, with PMHx of recurrent PE on chronic anticoagulation, HLD, CKD III, Depression, T2DM who presents after a syncopal episode at home.  She was found to have multifocal pneumonia on chest x-ray, later transferred to ICU because of worsening hypoxemia/acidosis despite BiPAP requiring intubation to protect airways. Later found to have strep pneumonia, with positive blood cultures for strep pneumonia. She was treated with  -vancomycin from October 22 to November 3. -Levaquin from October 22 to October 30. -Primaxin from October 31 to November 3 She was first extubated on October 29, and then reintubated the same day due to increased respiratory secretions. Eventually weaned off from vent and extubated on on November 2. She developed stridor and dysphagia because of prolonged intubation, she failed swallow study, needs reevaluation.  She became volume overload because of excessive fluid resuscitation due to shock, echo done on October 22 shows mild worsening of LVEF 40-45%( 50-55% on 08/2012) with mild LDH and grade 1 diastolic dysfunction. She was then diuresed and lost 9 pounds over 24 hour. Net fluid loss of -5411. She was started on a 3 day tapering course of steroid yesterday.  Subjective: She was feeling extremely thirsty when seen this morning, with dry lips and mucous membranes. Still unable to swallow, cough with sip of water. Denies any chest pain or shortness of breath.  Objective:  Vital signs in last 24 hours: Vitals:   10/24/16 0600 10/24/16 0719 10/24/16 0800 10/24/16 1002  BP: (!) 162/86  136/83 134/90  Pulse: 94  98   Resp: 18  (!) 22   Temp:   97.7 F (36.5 C)   TempSrc:   Oral   SpO2: 99% 95% 100%   Weight:      Height:       Gen. Chronically ill and fatigued-looking woman, dry lips and mucous membranes. Neck. Hoarse and weak voice ,Supple, no JVD, no carotid  bruit. Lungs. Few scattered wheeze. CV. Mildly tachycardic, no murmurs/rubs or gallop. Abdomen. Soft, nontender, bowel sounds positive. Extremities. No edema, no cyanosis, pulses 2+ bilaterally.  Labs. CBC Latest Ref Rng & Units 10/24/2016 10/23/2016 10/22/2016  WBC 4.0 - 10.5 K/uL 12.6(H) 20.8(H) 17.0(H)  Hemoglobin 12.0 - 15.0 g/dL 12.0 10.6(L) 9.2(L)  Hematocrit 36.0 - 46.0 % 37.0 32.3(L) 28.9(L)  Platelets 150 - 400 K/uL 645(H) 711(H) 675(H)   BMP Latest Ref Rng & Units 10/24/2016 10/23/2016 10/22/2016  Glucose 65 - 99 mg/dL 183(H) 168(H) 263(H)  BUN 6 - 20 mg/dL 126(H) 125(H) 136(H)  Creatinine 0.44 - 1.00 mg/dL 2.98(H) 2.85(H) 2.83(H)  BUN/Creat Ratio 12 - 28 - - -  Sodium 135 - 145 mmol/L 150(H) 146(H) 144  Potassium 3.5 - 5.1 mmol/L 4.4 4.7 3.2(L)  Chloride 101 - 111 mmol/L 116(H) 116(H) 112(H)  CO2 22 - 32 mmol/L 20(L) 21(L) 21(L)  Calcium 8.9 - 10.3 mg/dL 9.1 8.8(L) 8.2(L)   Mag. 2.1 Phosphorus. 6.9 INR. 1.49 Prothrombin Time. 18.2  CXR. FINDINGS: There has been significant improvement with resolution of the consolidation at the left lung base. No new lung abnormalities.  No convincing pleural effusion.  No pneumothorax.  Heart, mediastinum  and hila are unremarkable. Next item all support apparatus has been removed.  IMPRESSION: 1. Resolved left lung base pneumonia. 2. No acute cardiopulmonary disease.  Assessment/Plan:  Ms. Belke, 62 y. o lady, with PMHx of recurrent PE on chronic anticoagulation, HLD, CKD III, Depression, T2DM who presents after a syncopal episode at home. She was found to have multifocal pneumonia on chest x-ray, extubated on November 2, after 10 days of intubation.   Strep pneumonia bacteremia and pneumonia. Her chest x-ray done today shows resolved pneumonia. She has completed her antibiotic course. Her leukocytosis is improving. -Monitor oxygen saturation at room air. -Can give her oxygen if needed. -She will need inpatient  rehabilitation once medically stable.  Electrolyte abnormalities. She is having hypernatremia and hyper chloremia, most probably due to severe  Dehydration. Her free water deficit was 3.1 L. -1 liter half saline 5% dextrose bolus. -1 liter later half saline 5% dextrose at 100 mL/hr for 10 hours. -1 L  10%  dextrose at 100 ml/hr. for 10 hours -Repeat BMP tomorrow. And reassess her need.  Acute on chronic kidney disease. Her BUN today was 126, improved from 171 4 days ago. Creatinine is improving it was 2.98 today. She is very dehydrated. -IV fluid resuscitation, with half saline and dextrose or 2 L and then 10% dextrose and another one L. She has mildly elevated phosphorus at 6.9. -Repeat BMP tomorrow.  Dysphagia and hoarse voice. Most probably due to prolonged intubation. She was unable to swallow a sip of water in front of [place] today morning, started coughing. Needs reevaluation by swallow team. Patient refused feeding through NG tube. -IV 1/2 NS with 5% dextrose -Re consider NG tube feeding if still unable to swallow after evaluation.  Hypertension. Her blood pressure remains mostly within goal. -Continue amlodipine 10 mg daily.  History of reoccurring PE. She was on Xarelto at home. She developed coagulopathy on Coumadin requiring vitamin K. She was initially on heparin infusion, now she is on Lovenox 90 mg daily.  Diabetes. Last A1c was 6.2 on 07/06/2016. Her CBG stays between 142-177 over the last 24 hour. Continue sliding scale, will add Lantus once start eating.  Dispo: Anticipated discharge in approximately 2-3 day(s).   Lorella Nimrod, MD 10/24/2016, 10:40 AM Pager: [phone]  Medicine attending: I personally examined this patient today together with resident physician Dr. Cooper Render and I concur with her evaluation and management plan which we discussed together. This woman was initially admitted to the medical service on October 21 with acute hypoxic respiratory failure  and an extensive multifocal bilateral pulmonary infiltrates. She was in septic shock and decompensated rapidly. She was transferred to the intensive care unit where she was intubated. Blood cultures grew strep pneumoniae. She was treated with a 10 day course of vancomycin. She was given intermittent doses of Levaquin and Primaxin. She required prolonged intubation. She received intensive diuresis following aggressive fluid resuscitation. She has developed a post intubation laryngo-pharyngeal trauma with swallowing dysfunction. She had chronic renal insufficiency prior to this admission with creatinines in the range of 1.7-2.5 with normal BUNs. With intensive diuretic therapy, BUN rose as high as 159 with creatinine up to 3.3. Electrolytes today show progressive hypernatremia with sodium 150, persistent azotemia with BUN 126 and creatinine 3. Serum bicarbonate 20. Anion gap 14. Pneumonia completely resolved on chest x-ray today. Efforts at this time will be directed to cautiously rehydrating her and supporting her nutritionally until her traumatic laryngitis resolves. We recommended a feeding tube but she  declined. She may need temporary parenteral nutrition.  Murriel Hopper, MD, [place]  Hematology-Oncology/Internal Medicine

## 2016-10-24 NOTE — Progress Notes (Signed)
See MAR for new orders.

## 2016-10-24 NOTE — Plan of Care (Signed)
Problem: Education: Goal: Knowledge of [place] [facility] Education information/materials will improve Outcome: Progressing Paged IMTS and informed that patient is ICU and still under the care of critical care.  I explained that she is in a stepdown unit on [address]. I called e-link to see if they could give me something for the patient's SBP>170's HR 105-110's and anxiety / restlessness with no response at this time.

## 2016-10-24 NOTE — Plan of Care (Signed)
Problem: Safety: Goal: Ability to remain free from injury will improve Outcome: Progressing Informed in report patient did not pass her swallow screen therefore has been kept nothing by mouth.  Explained to patient with no teach back displayed.

## 2016-10-24 NOTE — Progress Notes (Signed)
PULMONARY / CRITICAL CARE MEDICINE   Name: Joy Butler MRN: [id number] DOB: [birth year]    ADMISSION DATE:  10/10/2016 CONSULTATION DATE:  10/11/16   REFERRING MD:  Dr. Reesa Chew / IMTS   CHIEF COMPLAINT:  Multi-focal PNA / Acute Respiratory Failure   Brief:  [name], occasional cigarette smoking, with PMH of arthritis, cataracts, chronic back pain, migraines, depression, HLD, HTN, splenic infarct, DM II, PE (2011, on xarelto with intermittent compliance) who presented to [facility] on 10/22 with reports of lower extremity pain and shortness of breath. The patient also reported a possible syncopal episode.  She presented to the ER via EMS. Per report, she had a memory of the fall. EMS found the patient to be hypoxic with room air saturations of 80%. She does not wear home oxygen at baseline. She was placed on a nonrebreather with improvement to 90%. The patient reported development of a cough, weakness and "not feeling well" one week prior to presentation. Initial ER evaluation found her to have increased work of breathing on exam with saturations in the 70s on room air. She had a mild elevation of lactic acid at 2.84 and rectal temperature of 100.8. The patient was found to have a chest x-ray concerning for multifocal pneumonia. Initially she was not hypotensive. However, later in the morning the patient developed hypotension and increased work of breathing. ABG evaluation demonstrated worsening acidosis. Initial labs-NA 138, K3.3, creatinine 3.85 (up from 2.46), bun 58, anion gap 14, alkaline phosphatase 123, albumin 1.6, BNP 284, troponin 0.07, WBC 18.7, hemoglobin 13.1, and platelets 397.  She felt nausea prior to intubation and vomited (the patient was able to notify staff, sat upright and secretions cleared via suction). Due to bilateral opacities, hypoxic respiratory failure and developing hypotension the decision was made to intubate the patient for airway protection.  PCCM called for ICU  admission.  STUDIES:  ECHO 10/22: LVEF 40-45% with mild LDH and G1DD (worse compared to EF of 50-55% 08/2012) CT Chest 10/22 >> extensive bilateral consolidative changes consistent with multifocal pneumonia, mild cardiomegaly with multivessel coronary disease  CULTURES: Influenza 10/22: negative BCx2 10/22 >> strep pneumo UA 10/22: proteinuria, small hgb, moderate bilirubin, negative nitrites and leukocytes; rbc 0-5 on add-on UC 10/22: < 10,000 colonies Strep pneumo urinary Ag 10/22: positive Respiratory panel 10/22: Negative  ANTIBIOTICS: Vanco 10/22 >> 10/24, 10/31>> Levaquin 10/22 >> 10/24 PRIMAXIN 10/31>>   LINES/TUBES: ETT 10/22 >> 10/29>Resp secretion>10/29>>11/02 PIV x 2>> Foley 10/22 >>   SIGNIFICANT EVENTS: 10/22  Admit per IMTS with PNA, failed BiPAP & required intubation  11/1: Walked without complications.  11/2: successfully extubated to 4L by [place].   Subjective:  Patient had failed previous swallowing assessment. Remains hoarse. Nurses ask updated swallow eval. Patient says she swallows water ok.. She had used Venturi mask with FiO2 of 55% over night for stridor. She is on high flow nasal canula at 10L and 55%. She complains about low back pain. Wants water.  VITAL SIGNS: BP (!) 162/86   Pulse 94   Temp 97.9 F (36.6 C) (Oral)   Resp 18   Ht 5\' 9"  (1.753 m)   Wt 87 kg (191 lb 12.8 oz)   SpO2 95%   BMI 28.32 kg/m   HEMODYNAMICS:  Stable  VENTILATOR SETTINGS:    INTAKE / OUTPUT: I/O last 3 completed shifts: In: 1351.2 [I.V.:1201.2; IV Piggyback:150] Out: [id number]  PHYSICAL EXAMINATION: General:  Chronically ill appearing female, NAD, lying in bed Neuro:  answers  to questions and following commands HEENT:  MM pink/dry, no jvd. Slight stridor. Weak voice/ hoarse Cardiovascular:  s1s2 rrr, no m/r/g, diminished DP and PT pulses in LE's Lungs: upper air sounds, improved  Abdomen:  Obese/soft, no TTP, bs very faint Musculoskeletal:  No  acute deformities, BLE symmetrical  Skin:  Cold extremity, no edema  LABS:  BMET  Recent Labs Lab 10/22/16 0224 10/23/16 0536 10/24/16 0552  NA 144 146* 150*  K 3.2* 4.7 4.4  CL 112* 116* 116*  CO2 21* 21* 20*  BUN 136* 125* 126*  CREATININE 2.83* 2.85* 2.98*  GLUCOSE 263* 168* 183*    Electrolytes  Recent Labs Lab 10/19/16 0456  10/20/16 0653  10/22/16 0224 10/22/16 0727 10/23/16 0536 10/24/16 0552  CALCIUM 8.6*  < > 8.8*  < > 8.2*  --  8.8* 9.1  MG 2.4  --  2.3  --   --  1.8  --  2.1  PHOS 6.0*  --  5.2*  --   --   --   --  6.9*  < > = values in this interval not displayed. CBC  Recent Labs Lab 10/22/16 0224 10/23/16 0536 10/24/16 0552  WBC 17.0* 20.8* 12.6*  HGB 9.2* 10.6* 12.0  HCT 28.9* 32.3* 37.0  PLT 675* 711* 645*   Coag's  Recent Labs Lab 10/22/16 0224 10/23/16 0536 10/24/16 0552  INR 2.01 1.65 1.49   Sepsis Markers No results for input(s): LATICACIDVEN, PROCALCITON, O2SATVEN in the last 168 hours. ABG  Recent Labs Lab 10/18/16 0440 10/18/16 0455  PHART TEST WILL BE CREDITED 7.507*  PCO2ART TEST WILL BE CREDITED 32.2  PO2ART TEST WILL BE CREDITED 82.7*   Liver Enzymes No results for input(s): AST, ALT, ALKPHOS, BILITOT, ALBUMIN in the last 168 hours. Cardiac Enzymes No results for input(s): TROPONINI, PROBNP in the last 168 hours. Glucose  Recent Labs Lab 10/23/16 1143 10/23/16 1551 10/23/16 2036 10/23/16 2341 10/24/16 0352 10/24/16 0737  GLUCAP 124* 85 151* 177* 173* 142*   Imaging No results found. S DISCUSSION: [name] with PMH of PE (non-compliant with xarelto), HTN, CKD, HLD admitted 10/22 per IMTS with multifocal PNA.  Developed worsening respiratory failure requiring intubation in ER. Found to have Strep pneumo bacteremia. Extubated 10/29 but reintubated for respiratory secretion.  Seen by PCCM today for 1-day overlap. Changing service to IMTS 11/4.  ASSESSMENT / PLAN:  PULMONARY A: Acute Hypoxic  Respiratory Failure - in the setting of multi-focal PNA  Multi-focal PNA - R>L opacities  Hx PE - on Xarelto, non-compliant with anticoagulation  Strep pneumo positive with urine Ag testing Net neg 1.0 CXR 11/4 cleared LLL but prominent parenchymal markings Hoarse since extubation P:   IS per RT protocol HFNC. Titrate oxygen for O2 > 92% Trend CXR - stable See ID section Duonebs q6h  CARDIOVASCULAR A:  Septic Shock - in setting of multifocal PNA Syncopal Episode - prior to admit Hx HTN, HLD LVEF 40-45% with mild LDH and G1DD (worse compared to EF of 50-55% 08/2012) P:  ICU monitoring of hemodynamics  D/Ced pressors Continue home norvasc Consider BB given HFrEF Lopressor 2.5 mg q3h prn  RENAL A:   Acute on Chronic Kidney Injury  - improving. sCr 2.85. b/l~ 2.4 Hypokalemia - resolved Pseudo-hypocalcemia - corrects normal (8.8) for hypoalbuminemia Hypernatremia At least CKD IIIB given labs earlier this year Renal function worse but stable Free water deficit of 1.8 L P:   Trend BMP/UOP  KVO IVF given hyperchloremia  Replace electrolytes as indicated. Free water  GASTROINTESTINAL A:   Protein Calorie Malnutrition - NPO for enteric feeds; contribution of CKD Nausea / Vomiting  Denies abd pain. 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 S/p K P:  Trend CBC Dcd coumadin 10/30 Hep drip PT/INR pending  INFECTIOUS A:   Multifocal PNA  Leukocytosis not resolved --> 19.8 > 18.6 > 21.4 > 17.0>21> 12.6 P:   Follow pan cultures  Abx as above, note allergies (keflex - facial swelling, clinda, sulfa) Trend PCT down 47 on 10/24 Vanc 10/22>>10/24, 10/31>> Levaquin 10/22>10/24 PRIMAXIN 10/31>>  ENDOCRINE A:   DM - last hgb A1c 6.2 on  07/06/16   Am CBG 142 P:   SSI-renal Hold lantus pending feeding/?NGT CBGs q4h  NEUROLOGIC A:   Chronic Back Pain Migraines   P:   RASS goal: 0 to -1  Fentanyl PRN for pain Reconsider pain management now in SDU CIR consulted   FAMILY  - Updates: No family at bedside.    [place], MD PCCM [phone] After 3:00 PM- [phone]  10/24/2016 8:16 AM

## 2016-10-25 ENCOUNTER — Inpatient Hospital Stay (HOSPITAL_COMMUNITY): Payer: Medicare Other

## 2016-10-25 DIAGNOSIS — Z789 Other specified health status: Secondary | ICD-10-CM

## 2016-10-25 LAB — CBC
HCT: 35 % — ABNORMAL LOW (ref 36.0–46.0)
HEMOGLOBIN: 11.3 g/dL — AB (ref 12.0–15.0)
MCH: 27.3 pg (ref 26.0–34.0)
MCHC: 32.3 g/dL (ref 30.0–36.0)
MCV: 84.5 fL (ref 78.0–100.0)
PLATELETS: 700 10*3/uL — AB (ref 150–400)
RBC: 4.14 MIL/uL (ref 3.87–5.11)
RDW: 17.7 % — ABNORMAL HIGH (ref 11.5–15.5)
WBC: 12.2 10*3/uL — AB (ref 4.0–10.5)

## 2016-10-25 LAB — BASIC METABOLIC PANEL
ANION GAP: 11 (ref 5–15)
Anion gap: 11 (ref 5–15)
BUN: 109 mg/dL — AB (ref 6–20)
BUN: 102 mg/dL — ABNORMAL HIGH (ref 6–20)
CALCIUM: 8.8 mg/dL — AB (ref 8.9–10.3)
CHLORIDE: 121 mmol/L — AB (ref 101–111)
CO2: 23 mmol/L (ref 22–32)
CO2: 21 mmol/L — ABNORMAL LOW (ref 22–32)
Calcium: 9 mg/dL (ref 8.9–10.3)
Chloride: 118 mmol/L — ABNORMAL HIGH (ref 101–111)
Creatinine, Ser: 2.87 mg/dL — ABNORMAL HIGH (ref 0.44–1.00)
Creatinine, Ser: 2.82 mg/dL — ABNORMAL HIGH (ref 0.44–1.00)
GFR calc Af Amer: 18 mL/min — ABNORMAL LOW (ref >60)
GFR calc non Af Amer: 16 mL/min — ABNORMAL LOW (ref >60)
GFR, EST AFRICAN AMERICAN: 19 mL/min — AB (ref >60)
GFR, EST NON AFRICAN AMERICAN: 16 mL/min — AB (ref >60)
GLUCOSE: 135 mg/dL — AB (ref 65–99)
Glucose, Bld: 193 mg/dL — ABNORMAL HIGH (ref 65–99)
POTASSIUM: 3.9 mmol/L (ref 3.5–5.1)
Potassium: 3.7 mmol/L (ref 3.5–5.1)
SODIUM: 153 mmol/L — AB (ref 135–145)
Sodium: 152 mmol/L — ABNORMAL HIGH (ref 135–145)

## 2016-10-25 LAB — GLUCOSE, CAPILLARY
GLUCOSE-CAPILLARY: 138 mg/dL — AB (ref 65–99)
GLUCOSE-CAPILLARY: 185 mg/dL — AB (ref 65–99)
GLUCOSE-CAPILLARY: 235 mg/dL — AB (ref 65–99)
Glucose-Capillary: 168 mg/dL — ABNORMAL HIGH (ref 65–99)
Glucose-Capillary: 194 mg/dL — ABNORMAL HIGH (ref 65–99)
Glucose-Capillary: 125 mg/dL — ABNORMAL HIGH (ref 65–99)

## 2016-10-25 LAB — OSMOLALITY, URINE: OSMOLALITY UR: 458 mosm/kg (ref 300–900)

## 2016-10-25 LAB — MAGNESIUM: Magnesium: 2 mg/dL (ref 1.7–2.4)

## 2016-10-25 LAB — PROTIME-INR
INR: 1.44
Prothrombin Time: 17.7 seconds — ABNORMAL HIGH (ref 11.4–15.2)

## 2016-10-25 LAB — PHOSPHORUS: Phosphorus: 6 mg/dL — ABNORMAL HIGH (ref 2.5–4.6)

## 2016-10-25 MED ORDER — IPRATROPIUM-ALBUTEROL 0.5-2.5 (3) MG/3ML IN SOLN
RESPIRATORY_TRACT | Status: AC
  Filled 2016-10-25: qty 3

## 2016-10-25 MED ORDER — DEXTROSE 5 % IV SOLN
INTRAVENOUS | Status: AC
  Administered 2016-10-25: 100 mL via INTRAVENOUS
  Administered 2016-10-25: 15:00:00 via INTRAVENOUS

## 2016-10-25 MED ORDER — IPRATROPIUM-ALBUTEROL 0.5-2.5 (3) MG/3ML IN SOLN
3.0000 mL | Freq: Once | RESPIRATORY_TRACT | Status: AC
  Administered 2016-10-25: 3 mL via RESPIRATORY_TRACT

## 2016-10-25 NOTE — Plan of Care (Addendum)
Problem: Education: Goal: Knowledge of [place] [facility] Education information/materials will improve Outcome: Progressing Patient is more lethargic but arousable, increased inspiratory and expiratory wheezing from this morning and blood glucose is elevated from 140's to 230's.  No teach back displayed at this time and will page IMTS after breathing treatment

## 2016-10-25 NOTE — Progress Notes (Signed)
Dr. Benjamine Mola paged regarding pt's breathing labored, expiratory wheezes.  Sao2 is 98% on room air.  Dr. Benjamine Mola states he will come by to check on pt.  Will continue to monitor closely.

## 2016-10-25 NOTE — Progress Notes (Signed)
ANTICOAGULATION CONSULT NOTE - Follow Up Consult  Pharmacy Consult for Heparin transitioned to Lovenox  Indication: History of pulmonary embolus  Allergies  Allergen Reactions  . Keflex [Cephalexin] Itching and Swelling    Lips and eyes swelled up  . Clindamycin/Lincomycin Itching  . Latex Itching  . Sulfa Antibiotics Rash    Patient Measurements: Height: 5\' 9"  (175.3 cm) Weight: 194 lb 3.6 oz (88.1 kg) IBW/kg (Calculated) : 66.2 Heparin Dosing Weight: 86 kg  Vital Signs: Temp: 97.3 F (36.3 C) (11/05 0833) Temp Source: Oral (11/05 0833) BP: 155/87 (11/05 0833) Pulse Rate: 85 (11/05 0608)  Labs:  Recent Labs  10/22/16 2301  10/23/16 0536 10/24/16 0552 10/25/16 0513  HGB  --   < > 10.6* 12.0 11.3*  HCT  --   --  32.3* 37.0 35.0*  PLT  --   --  711* 645* 700*  LABPROT  --   --  19.7* 18.2* 17.7*  INR  --   --  1.65 1.49 1.44  HEPARINUNFRC 0.60  --  0.63  --   --   CREATININE  --   --  2.85* 2.98* 2.87*  < > = values in this interval not displayed.  Estimated Creatinine Clearance: 22.5 mL/min (by C-G formula based on SCr of 2.87 mg/dL (H)).  Assessment: 65 yo female w/ history of PE on Xarelto PTA.  Now continues on Lovenox CBC stable  Goal of Therapy:  Anti-Xa level 0.6-1 units/ml 4hrs after LMWH dose given Monitor platelets by anticoagulation protocol: Yes   Plan:  Continue Lovenox 90 mg sq Q 24 hours Continue to follow  Thank you Anette Guarneri, PharmD [phone] 10/25/16 12:21 PM

## 2016-10-25 NOTE — Progress Notes (Signed)
Dr. Juleen China and I examined the patient at 10:30pm tonight as her nurse Leana Roe paged informing me that the pt has had worsening wheezing with inspiration and expiration in addition to a cbg of 230. The patient had no complaints of shortness of breath, wheezing, chest pain, leg swelling during the examination. She did complain of increased thirst and requested water however patient is NPO secondary to dysphagia post intubation.  General: The patient was resting comfortably in bed with the head of the bed minimally elevated. In no acute distress and she was alert although appeared fatigued. Voice soft and hoarse. HENT: No maxillary sinus tenderness  Cardiovascular: RRR without murmur, gallop or rub.  Pulmonary: Faint expiratory wheezing. Coarse upper airway noises. No apparent volume overload Abdomen: Soft, non-tender and non-distended.  Extremities: No LE edema to suggest volume overload  Assessment & Plan: This is a [name] here with a long hospital course secondary to strep pneumo pneumonia + bacteremia which has resolved via clear CXR & s/p 13 days of Vancomycin therapy. She did require 2 separate intubations and subsequently developed dysphagia and hoarseness. I was paged about increased inspiratory and expiratory wheezing by nursing without improvement after breathing treatment. There was also concern for increased lethargy. On physical exam, the patient appeared fatigued and responded appropriately to all commands and questions. Pt had minimal wheezing however does have coarse upper airway breath sounds, likely secondary to recent intubations. She does not appear volume overloaded and feel its best at this time to continue current ordered fluid therapy of D5W @ 200 ml/hr for patients hypernatremia. Nurse reported the patient has been receiving 3mL hr x 3 hours and was informed to keep at 200 mL/hr and page if patient develops symptoms of shortness of breath. Nursing staff noted patient is having  excellent urine output.

## 2016-10-25 NOTE — Procedures (Signed)
Central Venous Catheter Insertion Procedure Note Joy Butler [id number] Sep 14, 1951  Procedure: Insertion of Central Venous Catheter Indications: Assessment of intravascular volume  Procedure Details Consent: Risks of procedure as well as the alternatives and risks of each were explained to the (patient/caregiver).  Consent for procedure obtained. Time Out: Verified patient identification, verified procedure, site/side was marked, verified correct patient position, special equipment/implants available, medications/allergies/relevent history reviewed, required imaging and test results available.  Performed Real time [place] was used to ID and cannulate vessel  Maximum sterile technique was used including antiseptics, cap, gloves, gown, hand hygiene, mask and sheet. Skin prep: Chlorhexidine; local anesthetic administered A antimicrobial bonded/coated triple lumen catheter was placed in the left internal jugular vein using the Seldinger technique.  Evaluation Blood flow good Complications: No apparent complications Patient did tolerate procedure well. Chest X-ray ordered to verify placement.  CXR: pending.  Joy Butler 10/25/2016, 1:28 PM  Joy Butler ACNP-BC [place] Pager # [phone] OR # [phone] if no answer

## 2016-10-25 NOTE — Progress Notes (Signed)
Medicine attending: Clinical status and database reviewed with resident physician Dr. Vernelle Emerald and I concur with his evaluation and management plan which we discussed together. We calculated total body water needs. Fluid replacement with hypotonic fluids initiated yesterday. Some improvement with decreasing BUN and creatinine. Sodium lagging behind with persistent hypernatremia 152. Mental status stable. Still with significant swallowing difficulty. Poor vascular access. Central line will be placed via internal jugular approach in attempt to save venous access in her arms for likely ultimate dialysis. If no improvement in her swallowing function soon, then initiate parenteral nutrition.

## 2016-10-25 NOTE — Progress Notes (Signed)
   Subjective: [name] appears more comfortable today with decreased hoarseness of voice. She remains very thirsty but is NPO due to very poor swallow function.  She lost peripheral IV access today while requiring many ongoing treatments so central IV catheter planned.  Objective:  Vital signs in last 24 hours: Vitals:   10/25/16 0600 10/25/16 0608 10/25/16 0756 10/25/16 0833  BP: (!) 175/92 (!) 174/96  (!) 155/87  Pulse: 85 85    Resp: 18 17  16   Temp:    97.3 F (36.3 C)  TempSrc:    Oral  SpO2: 99% 100% 100% 97%  Weight:      Height:       GENERAL- Chronically ill appearing woman, uncomfortable but in no acute distress HEENT- Hoarse voice, mucous membranes and lips slightly dry CARDIAC- RRR, no murmurs, rubs or gallops. RESP- Coarse upper airway noises, faint expiratory wheezes ABDOMEN- Soft, nontender, no guarding or rebound NEURO- Strength upper and lower extremities- 5/5 EXTREMITIES- pulse 2+, symmetric, no pedal edema PSYCH- Anxious   Assessment/Plan:  Principal Problem:   CAP (community acquired pneumonia) Active Problems:   DM (diabetes mellitus), type 2 (HCC)   Hypercholesteremia   Depression   Acute kidney injury superimposed on chronic kidney disease (HCC)   Severe sepsis with acute organ dysfunction (HCC)   Pneumonia   Acute on chronic respiratory failure with hypoxia (HCC)   ARDS (adult respiratory distress syndrome) (HCC)   Septic shock (HCC)   Pressure injury of skin   Chronic pain syndrome   Labile blood glucose   History of pulmonary embolism   Acute on chronic combined systolic and diastolic congestive heart failure (HCC)   Medically noncompliant   Tachypnea   Tachycardia   Hypokalemia   Hypernatremia   Lymphocytosis   Acute blood loss anemia   Streptococcal sepsis (HCC)   Dysphagia, pharyngeal phase   Difficult intravenous access  Dysphagia: 2/2 laryngeal trauma when intubated for respiratory support. Her voice is partially improved  today but last swallow evaluation remained very poor. She is adamant in refusing NG tube. -NPO except ice with close supervision -Daily swallowing assessments -PCCM consulted for central access placement for therapies while NPO and possibly TPN if laryngeal injury continues to limit nutritional intake.  Acute on chronic kidney disease: BUN and SCr trending downward today with replacing intravascular volume. She is producing a very brisk UOP without further diuretics so we will continue more aggressive rates today. Will avoid PICC placement since baseline CrCl may be around 30 and unclear if there is more damage from this hospitalization.  Hypernatremia: Na increased today despite 1/2NS and d5W fluid yesterday. There may be some insensible losses and she is NPO but likely dilute urine output picking up -Increase D5W to 226mL/hr -F/U Repeat Bmet today PM  Streptococcal pneumonia with bacteremia: Resolved s/p 13 days vancomycin, with clear CXR and improved leukocytosis.  Hypertension: Elevated despite her clinical dehydration/hypernatremia. She is usually on many PO Rx but not currently so advance IV PRNs in moderation to prevent severe HTN. -IV hydralazine, labetalol PRN to maintain SBP <170  History of reoccurring PE: PTA xarelto chronically, on lovenox.  Diabetes: Reduced IVF to 5% dextrose due to moderate elevation of CBGs overnight.   Dispo: Anticipated discharge for CIR likely pending improvement of her renal injury and dysphagia.   Joy Salina, MD PGY-II Internal Medicine Resident Pager# [phone] 10/25/2016, 1:40 PM

## 2016-10-26 ENCOUNTER — Inpatient Hospital Stay (HOSPITAL_COMMUNITY): Payer: Medicare Other

## 2016-10-26 DIAGNOSIS — Z8701 Personal history of pneumonia (recurrent): Secondary | ICD-10-CM

## 2016-10-26 DIAGNOSIS — Z9689 Presence of other specified functional implants: Secondary | ICD-10-CM

## 2016-10-26 DIAGNOSIS — Z8619 Personal history of other infectious and parasitic diseases: Secondary | ICD-10-CM

## 2016-10-26 LAB — OSMOLALITY: OSMOLALITY: 363 mosm/kg — AB (ref 275–295)

## 2016-10-26 LAB — GLUCOSE, CAPILLARY
GLUCOSE-CAPILLARY: 166 mg/dL — AB (ref 65–99)
GLUCOSE-CAPILLARY: 65 mg/dL (ref 65–99)
GLUCOSE-CAPILLARY: 71 mg/dL (ref 65–99)
GLUCOSE-CAPILLARY: 124 mg/dL — AB (ref 65–99)
Glucose-Capillary: 260 mg/dL — ABNORMAL HIGH (ref 65–99)
Glucose-Capillary: 189 mg/dL — ABNORMAL HIGH (ref 65–99)
Glucose-Capillary: 128 mg/dL — ABNORMAL HIGH (ref 65–99)

## 2016-10-26 LAB — BASIC METABOLIC PANEL
Anion gap: 9 (ref 5–15)
BUN: 89 mg/dL — AB (ref 6–20)
CHLORIDE: 116 mmol/L — AB (ref 101–111)
CO2: 23 mmol/L (ref 22–32)
Calcium: 8.5 mg/dL — ABNORMAL LOW (ref 8.9–10.3)
Creatinine, Ser: 2.43 mg/dL — ABNORMAL HIGH (ref 0.44–1.00)
GFR calc Af Amer: 23 mL/min — ABNORMAL LOW (ref >60)
GFR calc non Af Amer: 19 mL/min — ABNORMAL LOW (ref >60)
GLUCOSE: 205 mg/dL — AB (ref 65–99)
POTASSIUM: 3.8 mmol/L (ref 3.5–5.1)
Sodium: 148 mmol/L — ABNORMAL HIGH (ref 135–145)

## 2016-10-26 LAB — PROTIME-INR
INR: 1.5
Prothrombin Time: 18.3 seconds — ABNORMAL HIGH (ref 11.4–15.2)

## 2016-10-26 LAB — MAGNESIUM: Magnesium: 1.9 mg/dL (ref 1.7–2.4)

## 2016-10-26 LAB — SODIUM, URINE, RANDOM: SODIUM UR: 67 mmol/L

## 2016-10-26 LAB — CREATININE, URINE, RANDOM: Creatinine, Urine: 42.28 mg/dL

## 2016-10-26 LAB — OSMOLALITY, URINE: Osmolality, Ur: 437 mOsm/kg (ref 300–900)

## 2016-10-26 LAB — PHOSPHORUS: Phosphorus: 5 mg/dL — ABNORMAL HIGH (ref 2.5–4.6)

## 2016-10-26 MED ORDER — LIDOCAINE HCL 4 % EX SOLN
0.0000 mL | Freq: Once | CUTANEOUS | Status: DC | PRN
  Filled 2016-10-26: qty 50

## 2016-10-26 MED ORDER — LIDOCAINE-EPINEPHRINE (PF) 1 %-1:200000 IJ SOLN
0.0000 mL | Freq: Once | INTRAMUSCULAR | Status: DC | PRN
  Filled 2016-10-26: qty 30

## 2016-10-26 MED ORDER — SODIUM CHLORIDE 0.9% FLUSH
10.0000 mL | Freq: Two times a day (BID) | INTRAVENOUS | Status: DC
  Administered 2016-10-27 – 2016-11-01 (×10): 10 mL via INTRAVENOUS

## 2016-10-26 MED ORDER — DEXTROSE 5 % IV SOLN
INTRAVENOUS | Status: AC
  Administered 2016-10-26: 13:00:00 via INTRAVENOUS

## 2016-10-26 MED ORDER — DEXTROSE 5 % IV SOLN
INTRAVENOUS | Status: DC
  Administered 2016-10-26: 09:00:00 via INTRAVENOUS

## 2016-10-26 MED ORDER — DEXTROSE-NACL 5-0.45 % IV SOLN
INTRAVENOUS | Status: AC
  Administered 2016-10-26: 1000 mL via INTRAVENOUS
  Administered 2016-10-27: 09:00:00 via INTRAVENOUS

## 2016-10-26 MED ORDER — TRIPLE ANTIBIOTIC 3.5-400-5000 EX OINT
1.0000 "application " | TOPICAL_OINTMENT | Freq: Once | CUTANEOUS | Status: DC | PRN
  Filled 2016-10-26: qty 1

## 2016-10-26 MED ORDER — RESOURCE THICKENUP CLEAR PO POWD
Freq: Once | ORAL | Status: AC
  Administered 2016-10-26: 13:00:00 via ORAL
  Filled 2016-10-26: qty 125

## 2016-10-26 MED ORDER — IPRATROPIUM-ALBUTEROL 0.5-2.5 (3) MG/3ML IN SOLN
3.0000 mL | Freq: Three times a day (TID) | RESPIRATORY_TRACT | Status: DC
  Administered 2016-10-27 – 2016-10-29 (×8): 3 mL via RESPIRATORY_TRACT
  Filled 2016-10-26 (×9): qty 3

## 2016-10-26 MED ORDER — LIDOCAINE HCL 2 % EX GEL
1.0000 "application " | Freq: Once | CUTANEOUS | Status: DC | PRN
  Filled 2016-10-26 (×2): qty 5

## 2016-10-26 MED ORDER — OXYMETAZOLINE HCL 0.05 % NA SOLN
1.0000 | Freq: Once | NASAL | Status: DC | PRN
  Filled 2016-10-26: qty 15

## 2016-10-26 MED ORDER — SILVER NITRATE-POT NITRATE 75-25 % EX MISC
1.0000 | Freq: Once | CUTANEOUS | Status: DC | PRN
  Filled 2016-10-26 (×2): qty 1

## 2016-10-26 NOTE — Progress Notes (Signed)
Inpatient Diabetes Program Recommendations  AACE/ADA: New Consensus Statement on Inpatient Glycemic Control (2015)  Target Ranges:  Prepandial:   less than 140 mg/dL      Peak postprandial:   less than 180 mg/dL (1-2 hours)      Critically ill patients:  140 - 180 mg/dL   Lab Results  Component Value Date   GLUCAP 189 (H) 10/26/2016   HGBA1C 6.2 07/06/2016    Review of Glycemic Control  Results for Joy Butler, Joy Butler (MRN [id number]) as of 10/26/2016 08:32  Ref. Range 10/25/2016 16:38 10/25/2016 19:30 10/25/2016 23:27 10/26/2016 04:31 10/26/2016 06:19  Glucose-Capillary Latest Ref Range: 65 - 99 mg/dL 194 (H) 235 (H) 125 (H) 260 (H) 189 (H)   Diabetes history: DM 2  Outpatient Diabetes medications: 70/30 25 units BID (Long acting Basal equivalent 35 units, Short acting insulin equivalent 15 units)  Current orders for Inpatient glycemic control: Novolog 2-6 units q4h  Inpatient Diabetes Program Recommendations: Agree with current orders for blood sugar management  Gentry Fitz, RN, [place], [place], CDE Diabetes Coordinator Inpatient Diabetes Program  [phone] (Team Pager) [phone] ([place]) 10/26/2016 8:33 AM

## 2016-10-26 NOTE — Care Management Important Message (Signed)
Important Message  Patient Details  Name: Joy Butler MRN: [id number] Date of Birth: [birth year]   Medicare Important Message Given:  Yes    [name] May 10/26/2016, 12:11 PM

## 2016-10-26 NOTE — Progress Notes (Addendum)
I continue to follow pt's progress to assist with planning final dispo. Await further demonstration of tolerance for more intense therapies. Pt  May need SNF level rehab. [phone]

## 2016-10-26 NOTE — Progress Notes (Addendum)
Nutrition Consult/Follow Up  DOCUMENTATION CODES:   Obesity unspecified  INTERVENTION:    Magic cup TID with meals, each supplement provides 290 kcal and 9 grams of protein  NEW NUTRITION DIAGNOSIS:   Predicted suboptimal nutrient intake related to dysphagia, lethargy/confusion as evidenced by  (MBSS results/impression), ongoing  GOAL:   Patient will meet greater than or equal to 90% of their needs, progressing  MONITOR:   PO intake, Supplement acceptance, Labs, Weight trends, I & O's  ASSESSMENT:   [name], occasional cigarette smoking, with PMH of arthritis, cataracts, chronic back pain, migraines, depression, HLD, HTN, splenic infarct, DM II, PE (2011, on xarelto with intermittent compliance) who presented to [facility] on 10/22 with reports of lower extremity pain and shortness of breath. Admitted with multi-focal PNA / acute respiratory failure.  Pt extubated 11/2. Vital High Protein formula discontinued via OGT. S/p bedside swallow evaluation 11/3, MBSS 11/6. Advanced to Dysphagia 1-honey thick liquids. Would benefit from oral nutrition supplements. CBG's [id number].  Diet Order:  DIET - DYS 1 Room service appropriate? 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  Estimated Nutritional Needs:   Kcal:  1800-2000  Protein:  90-100 gm  Fluid:  1.8-2.0 L  EDUCATION NEEDS:   No education needs identified at this time  Arthur Holms, RD, LDN Pager #: [phone] After-Hours Pager #: [phone]

## 2016-10-26 NOTE — Progress Notes (Signed)
Modified Barium Swallow Progress Note  Patient Details  Name: Joy Butler MRN: [id number] Date of Birth: [birth year]  Today's Date: 10/26/2016  Modified Barium Swallow completed.  Full report located under Chart Review in the Imaging Section.  Brief recommendations include the following:  Clinical Impression  Pt presents with a moderate pharyngeal dysphagia with reduced laryngeal closure and immediate, frank aspiration of nectar-thick liquids.  Honey-thick liquids were assessed multiple times - there was no penetration nor aspiration observed.  Purees consumed with adequate transfer through pharynx.  There was no post-swallow residue with any tested consistencies.  An oral delay was noted with delay in onset of posterior propulsion of all POs.  For today, recommend initiating a conservative diet of dysphagia 1, honey-thick liquids.  Crush meds and give with puree.  Please ensure slow, cautious intake - pt remains a high aspiration risk given MS and weak cough.  SLP will follow for safety/diet progression.  D/W RN and MD.   Swallow Evaluation Recommendations       SLP Diet Recommendations: Dysphagia 1 (Puree) solids;Honey thick liquids   Liquid Administration via: Cup   Medication Administration: Crushed with puree   Supervision: Staff to assist with self feeding   Compensations: Slow rate;Minimize environmental distractions;Small sips/bites   Postural Changes: Seated upright at 90 degrees   Oral Care Recommendations: Oral care BID   Other Recommendations: Order thickener from pharmacy    Juan Quam Laurice 10/26/2016,11:34 AM

## 2016-10-26 NOTE — Consult Note (Signed)
Reason for Consult: Laryngeal problems Referring Physician: Annia Belt, MD  Joy Butler is an 65 y.o. female.  HPI: Recent prolonged intubation. Extubated twice, second time was a couple of days ago and so far she has done well as far as no stridor or respiratory distress. She is having trouble swallowing and doesn't have much of a voice. Unable to provide any additional history.  Past Medical History:  Diagnosis Date  . Arthritis    "knees" (07/26/2014)  . Cataract of both eyes   . Chronic back pain   . Daily headache   . Depression   . High cholesterol   . Hypertension   . Hypertensive emergency 12/03/2015  . Kidney stones   . Migraine    "last one was in the 1990's" (07/26/2014)  . Neuropathy (HCC)    feet  . Pneumonia    "several times" (07/26/2014)  . Pulmonary embolism ([place])    2011 treated at [facility] in [place].  Was on Coumadin for over a year.  no known family history  . 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 Type II diabetes mellitus ([place]) dx'd 2000  . UTI (urinary tract infection)     Past Surgical History:  Procedure Laterality Date  . APPENDECTOMY    . ARTERY BIOPSY Right 05/09/2014   Procedure: BIOPSY TEMPORAL ARTERY;  Surgeon: Rosetta Posner, MD;  Location: [place];  Service: Vascular;  Laterality: Right;  . BREAST BIOPSY Left   . CARPAL TUNNEL RELEASE Bilateral   . CESAREAN SECTION  1982  . CHOLECYSTECTOMY     1980  . COLONOSCOPY WITH PROPOFOL N/A 12/06/2015   Procedure: COLONOSCOPY WITH PROPOFOL;  Surgeon: Wilford Corner, MD;  Location: [facility] ENDOSCOPY;  Service: Endoscopy;  Laterality: N/A;  . CYSTOSCOPY W/ STONE MANIPULATION    . DILATION AND CURETTAGE OF UTERUS  1982  . ESOPHAGOGASTRODUODENOSCOPY  08/19/2012   Procedure: ESOPHAGOGASTRODUODENOSCOPY (EGD);  Surgeon: Winfield Cunas., MD;  Location: [facility] ENDOSCOPY;  Service: Endoscopy;  Laterality: N/A;  . ESOPHAGOGASTRODUODENOSCOPY (EGD) WITH PROPOFOL N/A 12/06/2015   Procedure:  ESOPHAGOGASTRODUODENOSCOPY (EGD) WITH PROPOFOL;  Surgeon: Wilford Corner, MD;  Location: [facility] ENDOSCOPY;  Service: Endoscopy;  Laterality: N/A;  . EYE SURGERY Bilateral   . INCISION AND DRAINAGE ABSCESS N/A 03/28/2016   Procedure: INCISION, DRAINAGE  AND DEBRIDEMENT BACK  ABSCESS;  Surgeon: Greer Pickerel, MD;  Location: [place];  Service: General;  Laterality: N/A;  . REFRACTIVE SURGERY Bilateral   . TEMPORAL ARTERY BIOPSY / LIGATION Right 04/2014  . TONSILLECTOMY    . VAGINAL HYSTERECTOMY      Family History  Problem Relation Age of Onset  . Adopted: Yes  . Hypertension Mother     Social History:  reports that she has been smoking Cigarettes.  She has a 11.00 pack-year smoking history. She has never used smokeless tobacco. She reports that she does not drink alcohol or use drugs.  Allergies:  Allergies  Allergen Reactions  . Keflex [Cephalexin] Itching and Swelling    Lips and eyes swelled up  . Clindamycin/Lincomycin Itching  . Latex Itching  . Sulfa Antibiotics Rash    Medications: Reviewed  Results for orders placed or performed during the hospital encounter of 10/10/16 (from the past 48 hour(s))  Glucose, capillary     Status: Abnormal   Collection Time: 10/24/16  5:34 PM  Result Value Ref Range   Glucose-Capillary 156 (H) 65 - 99 mg/dL  Glucose, capillary  Status: Abnormal   Collection Time: 10/24/16  7:20 PM  Result Value Ref Range   Glucose-Capillary 140 (H) 65 - 99 mg/dL   Comment 1 Notify RN   Glucose, capillary     Status: Abnormal   Collection Time: 10/24/16 11:22 PM  Result Value Ref Range   Glucose-Capillary 152 (H) 65 - 99 mg/dL  Glucose, capillary     Status: Abnormal   Collection Time: 10/25/16  2:59 AM  Result Value Ref Range   Glucose-Capillary 168 (H) 65 - 99 mg/dL   Comment 1 Notify RN   CBC     Status: Abnormal   Collection Time: 10/25/16  5:13 AM  Result Value Ref Range   WBC 12.2 (H) 4.0 - 10.5 K/uL   RBC 4.14 3.87 - 5.11 MIL/uL   Hemoglobin  11.3 (L) 12.0 - 15.0 g/dL   HCT 35.0 (L) 36.0 - 46.0 %   MCV 84.5 78.0 - 100.0 fL   MCH 27.3 26.0 - 34.0 pg   MCHC 32.3 30.0 - 36.0 g/dL   RDW 17.7 (H) 11.5 - 15.5 %   Platelets 700 (H) 150 - 400 K/uL  Basic metabolic panel     Status: Abnormal   Collection Time: 10/25/16  5:13 AM  Result Value Ref Range   Sodium 152 (H) 135 - 145 mmol/L   Potassium 3.7 3.5 - 5.1 mmol/L   Chloride 118 (H) 101 - 111 mmol/L   CO2 23 22 - 32 mmol/L   Glucose, Bld 135 (H) 65 - 99 mg/dL   BUN 109 (H) 6 - 20 mg/dL   Creatinine, Ser 2.87 (H) 0.44 - 1.00 mg/dL   Calcium 8.8 (L) 8.9 - 10.3 mg/dL   GFR calc non Af Amer 16 (L) >60 mL/min   GFR calc Af Amer 18 (L) >60 mL/min    Comment: (NOTE) The eGFR has been calculated using the CKD EPI equation. This calculation has not been validated in all clinical situations. eGFR's persistently <60 mL/min signify possible Chronic Kidney Disease.    Anion gap 11 5 - 15  Magnesium     Status: None   Collection Time: 10/25/16  5:13 AM  Result Value Ref Range   Magnesium 2.0 1.7 - 2.4 mg/dL  Phosphorus     Status: Abnormal   Collection Time: 10/25/16  5:13 AM  Result Value Ref Range   Phosphorus 6.0 (H) 2.5 - 4.6 mg/dL  Protime-INR     Status: Abnormal   Collection Time: 10/25/16  5:13 AM  Result Value Ref Range   Prothrombin Time 17.7 (H) 11.4 - 15.2 seconds   INR 1.44   Glucose, capillary     Status: Abnormal   Collection Time: 10/25/16  7:48 AM  Result Value Ref Range   Glucose-Capillary 138 (H) 65 - 99 mg/dL  Glucose, capillary     Status: Abnormal   Collection Time: 10/25/16 12:40 PM  Result Value Ref Range   Glucose-Capillary 185 (H) 65 - 99 mg/dL  Basic metabolic panel     Status: Abnormal   Collection Time: 10/25/16  4:11 PM  Result Value Ref Range   Sodium 153 (H) 135 - 145 mmol/L   Potassium 3.9 3.5 - 5.1 mmol/L   Chloride 121 (H) 101 - 111 mmol/L   CO2 21 (L) 22 - 32 mmol/L   Glucose, Bld 193 (H) 65 - 99 mg/dL   BUN 102 (H) 6 - 20 mg/dL    Creatinine, Ser 2.82 (H) 0.44 - 1.00  mg/dL   Calcium 9.0 8.9 - 10.3 mg/dL   GFR calc non Af Amer 16 (L) >60 mL/min   GFR calc Af Amer 19 (L) >60 mL/min    Comment: (NOTE) The eGFR has been calculated using the CKD EPI equation. This calculation has not been validated in all clinical situations. eGFR's persistently <60 mL/min signify possible Chronic Kidney Disease.    Anion gap 11 5 - 15  Glucose, capillary     Status: Abnormal   Collection Time: 10/25/16  4:38 PM  Result Value Ref Range   Glucose-Capillary 194 (H) 65 - 99 mg/dL  Osmolality, urine     Status: None   Collection Time: 10/25/16  6:21 PM  Result Value Ref Range   Osmolality, Ur 458 300 - 900 mOsm/kg  Sodium, urine, random     Status: None   Collection Time: 10/25/16  6:22 PM  Result Value Ref Range   Sodium, Ur 67 mmol/L  Creatinine, urine, random     Status: None   Collection Time: 10/25/16  6:22 PM  Result Value Ref Range   Creatinine, Urine 42.28 mg/dL  Glucose, capillary     Status: Abnormal   Collection Time: 10/25/16  7:30 PM  Result Value Ref Range   Glucose-Capillary 235 (H) 65 - 99 mg/dL   Comment 1 Notify RN   Glucose, capillary     Status: Abnormal   Collection Time: 10/25/16 11:27 PM  Result Value Ref Range   Glucose-Capillary 125 (H) 65 - 99 mg/dL   Comment 1 Notify RN   Glucose, capillary     Status: Abnormal   Collection Time: 10/26/16  4:31 AM  Result Value Ref Range   Glucose-Capillary 260 (H) 65 - 99 mg/dL   Comment 1 Notify RN   Basic metabolic panel     Status: Abnormal   Collection Time: 10/26/16  6:00 AM  Result Value Ref Range   Sodium 148 (H) 135 - 145 mmol/L   Potassium 3.8 3.5 - 5.1 mmol/L   Chloride 116 (H) 101 - 111 mmol/L   CO2 23 22 - 32 mmol/L   Glucose, Bld 205 (H) 65 - 99 mg/dL   BUN 89 (H) 6 - 20 mg/dL   Creatinine, Ser 2.43 (H) 0.44 - 1.00 mg/dL   Calcium 8.5 (L) 8.9 - 10.3 mg/dL   GFR calc non Af Amer 19 (L) >60 mL/min   GFR calc Af Amer 23 (L) >60 mL/min     Comment: (NOTE) The eGFR has been calculated using the CKD EPI equation. This calculation has not been validated in all clinical situations. eGFR's persistently <60 mL/min signify possible Chronic Kidney Disease.    Anion gap 9 5 - 15  Magnesium     Status: None   Collection Time: 10/26/16  6:00 AM  Result Value Ref Range   Magnesium 1.9 1.7 - 2.4 mg/dL  Phosphorus     Status: Abnormal   Collection Time: 10/26/16  6:00 AM  Result Value Ref Range   Phosphorus 5.0 (H) 2.5 - 4.6 mg/dL  Protime-INR     Status: Abnormal   Collection Time: 10/26/16  6:00 AM  Result Value Ref Range   Prothrombin Time 18.3 (H) 11.4 - 15.2 seconds   INR 1.50   Osmolality     Status: Abnormal   Collection Time: 10/26/16  6:00 AM  Result Value Ref Range   Osmolality 363 (HH) 275 - 295 mOsm/kg    Comment: REPEATED TO VERIFY CRITICAL RESULT  CALLED TO, READ BACK BY AND VERIFIED WITH: PETTIFORD,A RN [id number] AT [phone] SKEEN,P   Glucose, capillary     Status: Abnormal   Collection Time: 10/26/16  6:19 AM  Result Value Ref Range   Glucose-Capillary 189 (H) 65 - 99 mg/dL   Comment 1 Notify RN   Osmolality, urine     Status: None   Collection Time: 10/26/16  6:50 AM  Result Value Ref Range   Osmolality, Ur 437 300 - 900 mOsm/kg  Glucose, capillary     Status: Abnormal   Collection Time: 10/26/16  8:35 AM  Result Value Ref Range   Glucose-Capillary 128 (H) 65 - 99 mg/dL   Comment 1 Notify RN   Glucose, capillary     Status: Abnormal   Collection Time: 10/26/16 12:59 PM  Result Value Ref Range   Glucose-Capillary 166 (H) 65 - 99 mg/dL    Dg Chest Port 1 View  Result Date: 10/25/2016 CLINICAL DATA:  Central line placement EXAM: PORTABLE CHEST 1 VIEW COMPARISON:  Chest radiograph from one day prior. FINDINGS: Left internal jugular central venous catheter courses superiorly in the right mediastinum with the tip either in the right brachiocephalic vein or upper third of the superior vena cava. Stable  cardiomediastinal silhouette with mild cardiomegaly. No pneumothorax. No pleural effusion. No overt pulmonary edema. No acute consolidative airspace disease. IMPRESSION: 1. No pneumothorax. 2. Left internal jugular central venous catheter courses superiorly in the right mediastinum, with the tip either in the right brachiocephalic vein or upper third of the SVC. Consider retracting 2-3 cm. 3. Stable mild cardiomegaly without pulmonary edema. No active pulmonary disease. These results were called by telephone at the time of interpretation on 10/25/2016 at 2:05 pm to RN [facility] ADDAI, who verbally acknowledged these results. Electronically Signed   By: Ilona Sorrel M.D.   On: 10/25/2016 14:05   Dg Swallowing Func-speech Pathology  Result Date: 10/26/2016 Objective Swallowing Evaluation: Type of Study: MBS-Modified Barium Swallow Study Patient Details Name: TIFFNEY HAUGHTON MRN: [id number] Date of Birth: 25-May-1951 Today's Date: 10/26/2016 Time: SLP Start Time (ACUTE ONLY): 1035-SLP Stop Time (ACUTE ONLY): 1100 SLP Time Calculation (min) (ACUTE ONLY): 25 min Past Medical History: Past Medical History: Diagnosis Date . Arthritis   "knees" (07/26/2014) . Cataract of both eyes  . Chronic back pain  . Daily headache  . Depression  . High cholesterol  . Hypertension  . Hypertensive emergency 12/03/2015 . Kidney stones  . Migraine   "last one was in the 1990's" (07/26/2014) . Neuropathy (HCC)   feet . Pneumonia   "several times" (07/26/2014) . Pulmonary embolism ([place])   2011 treated at [facility] in [place].  Was on Coumadin for over a year.  no known family history . Splenic infarction   On CT scan 09/2012 . Type II diabetes mellitus ([place]) dx'd 2000 . UTI (urinary tract infection)  Past Surgical History: Past Surgical History: Procedure Laterality Date . APPENDECTOMY   . ARTERY BIOPSY Right 05/09/2014  Procedure: BIOPSY TEMPORAL ARTERY;  Surgeon: Rosetta Posner, MD;  Location: [place];  Service: Vascular;  Laterality: Right; . BREAST  BIOPSY Left  . CARPAL TUNNEL RELEASE Bilateral  . CESAREAN SECTION  1982 . CHOLECYSTECTOMY    1980 . COLONOSCOPY WITH PROPOFOL N/A 12/06/2015  Procedure: COLONOSCOPY WITH PROPOFOL;  Surgeon: Wilford Corner, MD;  Location: [facility] ENDOSCOPY;  Service: Endoscopy;  Laterality: N/A; . CYSTOSCOPY W/ STONE MANIPULATION   . DILATION AND CURETTAGE OF UTERUS  1982 . ESOPHAGOGASTRODUODENOSCOPY  08/19/2012  Procedure: ESOPHAGOGASTRODUODENOSCOPY (EGD);  Surgeon: Winfield Cunas., MD;  Location: [facility] ENDOSCOPY;  Service: Endoscopy;  Laterality: N/A; . ESOPHAGOGASTRODUODENOSCOPY (EGD) WITH PROPOFOL N/A 12/06/2015  Procedure: ESOPHAGOGASTRODUODENOSCOPY (EGD) WITH PROPOFOL;  Surgeon: Wilford Corner, MD;  Location: [facility] ENDOSCOPY;  Service: Endoscopy;  Laterality: N/A; . EYE SURGERY Bilateral  . INCISION AND DRAINAGE ABSCESS N/A 03/28/2016  Procedure: INCISION, DRAINAGE  AND DEBRIDEMENT BACK  ABSCESS;  Surgeon: Greer Pickerel, MD;  Location: [place];  Service: General;  Laterality: N/A; . REFRACTIVE SURGERY Bilateral  . TEMPORAL ARTERY BIOPSY / LIGATION Right 04/2014 . TONSILLECTOMY   . VAGINAL HYSTERECTOMY   HPI: [name] with PMH of PE (non-compliant with xarelto), HTN, CKD, HLD admitted 10/22 per IMTS with multifocal PNA. Developed worsening respiratory failure requiring intubation 10/22-10/29 with reintubation 10/29-11/2. Subjective: pt alert Assessment / Plan / Recommendation CHL IP CLINICAL IMPRESSIONS 10/26/2016 Therapy Diagnosis Moderate pharyngeal phase dysphagia Clinical Impression Pt presents with a moderate pharyngeal dysphagia with reduced laryngeal closure and immediate, frank aspiration of nectar-thick liquids.  Honey-thick liquids were assessed multiple times - there was no penetration nor aspiration observed.  Purees consumed with adequate transfer through pharynx.  There was no post-swallow residue with any tested consistencies.  An oral delay was noted with delay in onset of posterior propulsion of all POs.  For today,  recommend initiating a conservative diet of dysphagia 1, honey-thick liquids.  Crush meds and give with puree.  Please ensure slow, cautious intake - pt remains a high aspiration risk given MS, weak cough, aphonia.  SLP will follow for safety/diet progression.  D/W RN and MD. Impact on safety and function Moderate aspiration risk   CHL IP TREATMENT RECOMMENDATION 10/26/2016 Treatment Recommendations Therapy as outlined in treatment plan below   Prognosis 10/26/2016 Prognosis for Safe Diet Advancement Good Barriers to Reach Goals -- Barriers/Prognosis Comment -- CHL IP DIET RECOMMENDATION 10/26/2016 SLP Diet Recommendations Dysphagia 1 (Puree) solids;Honey thick liquids Liquid Administration via Cup Medication Administration Crushed with puree Compensations Slow rate;Minimize environmental distractions;Small sips/bites Postural Changes Seated upright at 90 degrees   CHL IP OTHER RECOMMENDATIONS 10/26/2016 Recommended Consults -- Oral Care Recommendations Oral care BID Other Recommendations Order thickener from pharmacy   CHL IP FOLLOW UP RECOMMENDATIONS 10/26/2016 Follow up Recommendations (No Data)   CHL IP FREQUENCY AND DURATION 10/26/2016 Speech Therapy Frequency (ACUTE ONLY) min 2x/week Treatment Duration 2 weeks      CHL IP ORAL PHASE 10/26/2016 Oral Phase Impaired Oral - Pudding Teaspoon -- Oral - Pudding Cup -- Oral - Honey Teaspoon -- Oral - Honey Cup Holding of bolus;Delayed oral transit Oral - Nectar Teaspoon -- Oral - Nectar Cup Holding of bolus;Delayed oral transit Oral - Nectar Straw -- Oral - Thin Teaspoon -- Oral - Thin Cup -- Oral - Thin Straw -- Oral - Puree Holding of bolus;Delayed oral transit Oral - Mech Soft -- Oral - Regular -- Oral - Multi-Consistency -- Oral - Pill -- Oral Phase - Comment --  CHL IP PHARYNGEAL PHASE 10/26/2016 Pharyngeal Phase Impaired Pharyngeal- Pudding Teaspoon -- Pharyngeal -- Pharyngeal- Pudding Cup -- Pharyngeal -- Pharyngeal- Honey Teaspoon -- Pharyngeal -- Pharyngeal- Honey  Cup Delayed swallow initiation-vallecula Pharyngeal -- Pharyngeal- Nectar Teaspoon -- Pharyngeal -- Pharyngeal- Nectar Cup Delayed swallow initiation-pyriform sinuses;Moderate aspiration;Penetration/Aspiration during swallow Pharyngeal Material enters airway, passes BELOW cords and not ejected out despite cough attempt by patient Pharyngeal- Nectar Straw -- Pharyngeal -- Pharyngeal- Thin Teaspoon -- Pharyngeal -- Pharyngeal- Thin Cup -- Pharyngeal -- Pharyngeal- Thin  Straw -- Pharyngeal -- Pharyngeal- Puree Delayed swallow initiation-vallecula Pharyngeal -- Pharyngeal- Mechanical Soft -- Pharyngeal -- Pharyngeal- Regular -- Pharyngeal -- Pharyngeal- Multi-consistency -- Pharyngeal -- Pharyngeal- Pill -- Pharyngeal -- Pharyngeal Comment --  No flowsheet data found. No flowsheet data found. Juan Quam Laurice 10/26/2016, 11:31 AM               [id number] except as listed in admit H&P  Blood pressure (!) 164/95, pulse 76, temperature 97.8 F (36.6 C), temperature source Oral, resp. rate 14, height 5' 9"  (1.753 m), weight 82.9 kg (182 lb 12.2 oz), SpO2 99 %.  PHYSICAL EXAM: Overall appearance:  Healthy appearing, in no distress, aphonic, no respiratory distress. Head:  Normocephalic, atraumatic. Ears: External ears look healthy. Nose: External nose is healthy in appearance. Internal nasal exam with bilateral crusty buildup. Oral Cavity/Pharynx:  There are no mucosal lesions or masses identified. Larynx/Hypopharynx: Vocal cords are mobile, bilateral posterior cord ulcerations, consistent with prolonged intubation trauma. No other abnormalities identified in the larynx or hypopharynx. Neuro:  No identifiable neurologic deficits. Neck: No palpable neck masses.  Studies Reviewed: none  Procedures: Flexible fiberoptic laryngoscopy.  Xylocaine gel was placed into the nasal cavities bilaterally. The scope was passed through the right nasal passageway. There was bilateral nasal septal crusting  present. The nasopharynx is clear. Laryngeal exam reveals the above-mentioned findings. The scope was removed. She tolerated this well.   Assessment/Plan: Bilateral vocal cord ulcers, with what appears to be good mobility. Recommend treat for reflux and allow time for this to resolve. Remain nothing by mouth if there is any evidence of persistent aspiration.  Nhia Heaphy 10/26/2016, 4:51 PM

## 2016-10-26 NOTE — Progress Notes (Signed)
Subjective: Patient was feeling very lethargic and fatigued. Complaint of thirst and abdominal pain. She was still and below because of failed swallow evaluation yesterday . Central line was placed yesterday because of loss of peripheral excess. She had agreed to try NG tube, if still unable to swallow today. Objective:  Vital signs in last 24 hours: Vitals:   10/26/16 0600 10/26/16 0700 10/26/16 0748 10/26/16 0900  BP:  (!) 158/83    Pulse: 90 80    Resp: 20 15    Temp:    97.8 F (36.6 C)  TempSrc:    Oral  SpO2: 99% 100% 98%   Weight:      Height:       Gen. Very lethargic and fatigued, easily arousable and following command. looks uncomfortable.  HEENT. Soft and hoarse voice, dry lips and mucous membranes. Lungs. Upper respiratory course transmitted sounds with few scattered expiratory wheezing. CV. Mild tachycardia with regular rhythm, no murmurs/gallop or rub. Abdomen. Soft, diffuse tenderness, non distended, bowel sounds positive. Extremities. No edema, no cyanosis, pulses 2+ bilaterally.  Labs. BMP Latest Ref Rng & Units 10/26/2016 10/25/2016 10/25/2016  Glucose 65 - 99 mg/dL 205(H) 193(H) 135(H)  BUN 6 - 20 mg/dL 89(H) 102(H) 109(H)  Creatinine 0.44 - 1.00 mg/dL 2.43(H) 2.82(H) 2.87(H)  BUN/Creat Ratio 12 - 28 - - -  Sodium 135 - 145 mmol/L 148(H) 153(H) 152(H)  Potassium 3.5 - 5.1 mmol/L 3.8 3.9 3.7  Chloride 101 - 111 mmol/L 116(H) 121(H) 118(H)  CO2 22 - 32 mmol/L 23 21(L) 23  Calcium 8.9 - 10.3 mg/dL 8.5(L) 9.0 8.8(L)   Serum osmolality. 363 Urine osmolality.437 Phosphorous. 5.0 Magnesium. 1.9 INR. 1.50 PT. 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, 65 y. 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 She was found to have multifocal pneumonia on chest x-ray, extubated on November 2, after 10 days of intubation.  Electrolyte abnormalities. Her hyponatremia is improving, today her sodium was 148. 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 She had a lot of interruption getting free water yesterday, -Continue giving her free water, as her renal functions are improving. -Monitor serum and urine osmolalities. -Monitor BMP daily. We will involve nephrology and think about giving her a trial of desmopressin only if she deteriorates.  Dysphagia and hoarse voice. Most probably due to prolonged intubation. If she again failed her swallow evaluation today, I will proceed with NG  tube feeding. If there is no improvement we will involve ENT for further evaluation. -She failed her swallow evaluation today, we will proceed with cortrak and start feeding through that.  Acute on chronic kidney disease. Her renal functions are improving. BUN of 89 and creatinine of 2.43 today. Her creatinine is pretty much at her baseline now. BUN is improving with rehydration. Her  phosphorous was 5 today, improved from  previous reading of 6.9. -Continue monitoring BMP daily.  Strep pneumonia bacteremia and pneumonia.Marland Kitchen Resolved on her repeat chest x-ray done on Saturday. She has completed her antibiotic course. She is saturating well on room air. She will need inpatient rehabilitation because of her severe deconditioning once medically stable.  Hypertension.  Her blood pressure remains elevated on home dose of amlodipine 10 mg daily. Labetalol 10 mg IV when necessary was added to her regimen with a goal of systolic blood pressure less than 170.  Diabetes. Last A1c was 6.2 on 07/06/2016. Her CBG stays between 128-260 over the last 24 hour. Continue sliding scale, will add Lantus once start eating.  History of reoccurring PE. Continue Lovenox.she can be discharged on home dose of xarelto.  Dispo: Anticipated discharge in approximately 2-3 day(s), pending improvement of her renal injury and dysphagia.  Lorella Nimrod, MD 10/26/2016, 9:33 AM Pager: [phone]

## 2016-10-26 NOTE — Significant Event (Signed)
Follow up for cortrak feeding tube as backup for primary team. Spoke with patient's RN who stated that patient does not need a small bored feeding tube at this time. Thank you for consulting team.

## 2016-10-26 NOTE — Progress Notes (Signed)
Speech Language Pathology Treatment: Dysphagia  Patient Details Name: Joy Butler MRN: [id number] DOB: [birth year] Today's Date: 10/26/2016 Time: [id number] SLP Time Calculation (min) (ACUTE ONLY): 12 min  Assessment / Plan / Recommendation Clinical Impression  F/u for swallowing. Pt continues with aphonia, wheezing, and poor toleration of PO trials, concerning for laryngeal trauma s/p extended oral intubation.  She is confused, verbalizing minimally today and requiring max verbal/visual cues for participation.   Recommend proceeding with MBS to determine potential for PO intake. May need Cortrak for nutrition; MBS this am.  D/W RN.   HPI HPI: [name] with PMH of PE (non-compliant with xarelto), HTN, CKD, HLD admitted 10/22 per IMTS with multifocal PNA. Developed worsening respiratory failure requiring intubation 10/22-10/29 with reintubation 10/29-11/2.      SLP Plan  Continue with current plan of care  MBS today   Recommendations  Diet recommendations: NPO                Oral Care Recommendations: Oral care QID Plan: Continue with current plan of care       GO               Joy Butler L. Tivis Ringer, [place] CCC/SLP Pager [phone]  Joy Butler 10/26/2016, 10:13 AM

## 2016-10-26 NOTE — Progress Notes (Addendum)
  Date: 10/26/2016  Patient name: Joy Butler  Medical record number: [id number]  Date of birth: 1951-07-06   This patient's plan of care was discussed with the house staff. Please see their note for complete details. I concur with their findings. She continues to have significant stridor.  Agrees to NGTube  Vitals:   10/26/16 0700 10/26/16 0900  BP: (!) 158/83   Pulse: 80   Resp: 15   Temp:  97.8 F (36.6 C)   cv- RRR Chest- upper airway noise. Significant.  abd- BS+, soft, non-tender.  Labs- Na 148 Cr 2.43 SOsm 363 UOsm 437  A/P Hypernatremia, Hyperosmolarity Will resume her free water (had intrerruption yesterday) Will get swallow study and possibly NGT  Stridor Due to previous intb/extb.  Will consider ENT, CCM eval if not improved.   Prev Streptococcal PNA, sepsis Off anbx Will recheck her BCx  HIV (-) [id number]  Campbell Riches, MD 10/26/2016, 9:26 AM

## 2016-10-26 NOTE — Evaluation (Signed)
Occupational Therapy Evaluation Patient Details Name: Joy Butler MRN: [id number] DOB: 1951/04/01 Today's Date: 10/26/2016    History of Present Illness 65 yo admitted with CAP with VDRF 10/22-10/29 with reintubation same day, bronchoscopy 10/31, extubated 11/2. PMhx: arthritis, back pain, DM, neuropathy, HTN, falls, PE   Clinical Impression   Pt ambulated with a RW and was assisted for bathing and LB dressing intermittently. She presents with generalized weakness, decreased balance, and impaired cognition interfering with ability to perform ADL. She requires 2 person assist for OOB. Pt will need intensive rehab to prepare her for home. Will follow acutely.    Follow Up Recommendations  CIR;Supervision/Assistance - 24 hour    Equipment Recommendations   (defer to next venue)    Recommendations for Other Services       Precautions / Restrictions Precautions Precautions: Fall Restrictions Weight Bearing Restrictions: No      Mobility Bed Mobility Overal bed mobility: Needs Assistance Bed Mobility: Rolling;Supine to Sit;Sit to Supine Rolling: Modified independent (Device/Increase time)   Supine to sit: Mod assist Sit to supine: Supervision   General bed mobility comments: assist to guide LEs off bed and raise trunk, rolls using rail without assist  Transfers       Sit to Stand: Max assist;From elevated surface;+2 physical assistance              Balance     Sitting balance-Leahy Scale: Fair Sitting balance - Comments: min guard, no use of UEs     Standing balance-Leahy Scale: Poor                              ADL Overall ADL's : Needs assistance/impaired Eating/Feeding: Minimal assistance;Sitting Eating/Feeding Details (indicate cue type and reason): self fed applesauce with increased spillage Grooming: Wash/dry face;Sitting;Minimal assistance   Upper Body Bathing: Moderate assistance;Sitting   Lower Body Bathing: Total  assistance;Sitting/lateral leans   Upper Body Dressing : Moderate assistance;Sitting   Lower Body Dressing: Total assistance;Sitting/lateral leans Lower Body Dressing Details (indicate cue type and reason): able to cross foot over opposite knee                     Vision     Perception     Praxis      Pertinent Vitals/Pain Pain Assessment: No/denies pain Faces Pain Scale: No hurt     Hand Dominance Right   Extremity/Trunk Assessment Upper Extremity Assessment Upper Extremity Assessment: Generalized weakness   Lower Extremity Assessment Lower Extremity Assessment: Defer to PT evaluation   Cervical / Trunk Assessment Cervical / Trunk Assessment: Other exceptions (hx of chronic back pain)   Communication Communication Communication: Expressive difficulties   Cognition Arousal/Alertness: Awake/alert Behavior During Therapy: WFL for tasks assessed/performed Overall Cognitive Status: Impaired/Different from baseline Area of Impairment: Memory;Safety/judgement;Attention;Problem solving   Current Attention Level: Sustained Memory: Decreased short-term memory   Safety/Judgement: Decreased awareness of safety;Decreased awareness of deficits   Problem Solving: Slow processing;Decreased initiation;Requires verbal cues;Requires tactile cues;Difficulty sequencing     General Comments       Exercises       Shoulder Instructions      Home Living Family/patient expects to be discharged to:: Private residence Living Arrangements: Spouse/significant other Available Help at Discharge: Family;Available 24 hours/day Type of Home: House Home Access: Stairs to enter [employer] of Steps: 3 Entrance Stairs-Rails: Right;Left;Can reach both Home Layout: One level     Bathroom Shower/Tub:  Tub/shower unit;Curtain   Bathroom Toilet: Standard     Home Equipment: Cane - single point;Shower seat;Walker - 2 wheels;Bedside commode          Prior  Functioning/Environment Level of Independence: Needs assistance  Gait / Transfers Assistance Needed: ambulated with RW for shorter distances ADL's / Homemaking Assistance Needed: assist for housekeeping, assist for bathing and at times assist for dressing lower body   Comments: using RW, multiple falls        OT Problem List: Decreased strength;Decreased activity tolerance;Impaired balance (sitting and/or standing);Decreased coordination;Decreased cognition;Decreased safety awareness;Decreased knowledge of use of DME or AE;Cardiopulmonary status limiting activity;Decreased knowledge of precautions;Impaired UE functional use   OT Treatment/Interventions: Self-care/ADL training;DME and/or AE instruction;Therapeutic activities;Cognitive remediation/compensation;Patient/family education;Balance training    OT Goals(Current goals can be found in the care plan section) Acute Rehab OT Goals Patient Stated Goal: drink water OT Goal Formulation: With patient Time For Goal Achievement: 11/09/16 Potential to Achieve Goals: Good ADL Goals Pt Will Perform Eating: Independently;sitting Pt Will Perform Grooming: with supervision;sitting Pt Will Perform Upper Body Dressing: with supervision;sitting Pt Will Perform Lower Body Dressing: with min assist;sit to/from stand Pt Will Transfer to Toilet: with min assist;ambulating;bedside commode Pt Will Perform Toileting - Clothing Manipulation and hygiene: with min assist;sit to/from stand Additional ADL Goal #1: Pt demonstrate selective attention during ADL.  OT Frequency: Min 2X/week   Barriers to D/C:            Co-evaluation              End of Session Nurse Communication: Mobility status  Activity Tolerance: Patient tolerated treatment well Patient left: in bed;with call bell/phone within reach;with bed alarm set   Time: 1244-1317 OT Time Calculation (min): 33 min Charges:  OT General Charges $OT Visit: 1 Procedure OT Evaluation $OT  Eval Moderate Complexity: 1 Procedure OT Treatments $Self Care/Home Management : 8-22 mins G-Codes:    Malka So 10/26/2016, 1:42 PM  [phone]

## 2016-10-26 NOTE — Progress Notes (Signed)
After patient was on D5W @ 10 ml/hr her wheezing improved slightly.  Patient also dropped in blood sugar from 230's to 125 during the same period.  D5W @ 200 ml/hr was stopped at 0500 per order.  Patient kept at D5W @ kvo 10 ml/hr to prevent blood sugar from dropping since she is still NPO and hasn't passed her swallow screen with speech therapy.  IMTS paged with serum osmolality critical value of 363 this AM as well.

## 2016-10-27 DIAGNOSIS — J387 Other diseases of larynx: Secondary | ICD-10-CM

## 2016-10-27 LAB — BASIC METABOLIC PANEL
ANION GAP: 10 (ref 5–15)
BUN: 77 mg/dL — ABNORMAL HIGH (ref 6–20)
CHLORIDE: 113 mmol/L — AB (ref 101–111)
CO2: 23 mmol/L (ref 22–32)
Calcium: 8.3 mg/dL — ABNORMAL LOW (ref 8.9–10.3)
Creatinine, Ser: 2.35 mg/dL — ABNORMAL HIGH (ref 0.44–1.00)
GFR, EST AFRICAN AMERICAN: 24 mL/min — AB (ref >60)
GFR, EST NON AFRICAN AMERICAN: 20 mL/min — AB (ref >60)
Glucose, Bld: 95 mg/dL (ref 65–99)
POTASSIUM: 3.5 mmol/L (ref 3.5–5.1)
SODIUM: 146 mmol/L — AB (ref 135–145)

## 2016-10-27 LAB — GLUCOSE, CAPILLARY
GLUCOSE-CAPILLARY: 115 mg/dL — AB (ref 65–99)
GLUCOSE-CAPILLARY: 153 mg/dL — AB (ref 65–99)
GLUCOSE-CAPILLARY: 209 mg/dL — AB (ref 65–99)
Glucose-Capillary: 100 mg/dL — ABNORMAL HIGH (ref 65–99)
Glucose-Capillary: 64 mg/dL — ABNORMAL LOW (ref 65–99)
Glucose-Capillary: 277 mg/dL — ABNORMAL HIGH (ref 65–99)
Glucose-Capillary: 175 mg/dL — ABNORMAL HIGH (ref 65–99)

## 2016-10-27 LAB — PROTIME-INR
INR: 1.45
PROTHROMBIN TIME: 17.8 s — AB (ref 11.4–15.2)

## 2016-10-27 LAB — PHOSPHORUS: PHOSPHORUS: 4.3 mg/dL (ref 2.5–4.6)

## 2016-10-27 LAB — MAGNESIUM: MAGNESIUM: 1.6 mg/dL — AB (ref 1.7–2.4)

## 2016-10-27 MED ORDER — ACETAMINOPHEN 325 MG PO TABS
650.0000 mg | ORAL_TABLET | ORAL | Status: DC | PRN
  Administered 2016-10-27: 650 mg via ORAL
  Filled 2016-10-27: qty 2

## 2016-10-27 MED ORDER — DEXTROSE-NACL 5-0.45 % IV SOLN
INTRAVENOUS | Status: AC
  Administered 2016-10-27: 20:00:00 via INTRAVENOUS

## 2016-10-27 MED ORDER — INSULIN ASPART 100 UNIT/ML ~~LOC~~ SOLN
0.0000 [IU] | Freq: Every day | SUBCUTANEOUS | Status: DC
  Administered 2016-10-30: 2 [IU] via SUBCUTANEOUS

## 2016-10-27 MED ORDER — MAGNESIUM SULFATE 2 GM/50ML IV SOLN
2.0000 g | Freq: Once | INTRAVENOUS | Status: AC
  Administered 2016-10-27: 2 g via INTRAVENOUS
  Filled 2016-10-27: qty 50

## 2016-10-27 MED ORDER — ACETAMINOPHEN 325 MG PO TABS
650.0000 mg | ORAL_TABLET | Freq: Four times a day (QID) | ORAL | Status: DC | PRN
Start: 2016-10-27 — End: 2016-11-06
  Administered 2016-10-28 – 2016-11-02 (×8): 650 mg via ORAL
  Filled 2016-10-27 (×8): qty 2

## 2016-10-27 MED ORDER — INSULIN ASPART 100 UNIT/ML ~~LOC~~ SOLN
0.0000 [IU] | Freq: Three times a day (TID) | SUBCUTANEOUS | Status: DC
  Administered 2016-10-27: 5 [IU] via SUBCUTANEOUS
  Administered 2016-10-27: 2 [IU] via SUBCUTANEOUS
  Administered 2016-10-28: 1 [IU] via SUBCUTANEOUS
  Administered 2016-10-28: 2 [IU] via SUBCUTANEOUS
  Administered 2016-10-28: 1 [IU] via SUBCUTANEOUS
  Administered 2016-10-29 (×2): 2 [IU] via SUBCUTANEOUS
  Administered 2016-10-30: 3 [IU] via SUBCUTANEOUS
  Administered 2016-10-30: 5 [IU] via SUBCUTANEOUS
  Administered 2016-10-30: 3 [IU] via SUBCUTANEOUS
  Administered 2016-10-31 – 2016-11-05 (×6): 1 [IU] via SUBCUTANEOUS

## 2016-10-27 NOTE — Progress Notes (Signed)
  Date: 10/27/2016  Patient name: Joy Butler  Medical record number: [id number]  Date of birth: [birth year]   This patient's plan of care was discussed with the house staff. Please see their note for complete details. I concur with their findings. More comfortable Eating thicks with nursing assistance/observation.  Voice improved.   Vitals:   10/27/16 0900 10/27/16 1003  BP: (!) 169/91 (!) 158/87  Pulse: 88 88  Resp: 18 14  Temp:     R neck IJ- clean Chest- rhoncorous CV- rrr Abd- bs+, soft, nontender  Labs: Cr 2.35    Na 146  A/P Hypernatremia Better with free water. Will need to assess her ability to maintain this as she progresses off IVF.  Follow nutrition/calorie counts.   Vocal Cord Ulcers Appreciate ENT eval.  Treat for reflux She appears to be improving.   Placement- Will discuss with rehab.    Campbell Riches, MD 10/27/2016, 11:18 AM

## 2016-10-27 NOTE — Plan of Care (Signed)
Problem: Education: Goal: Knowledge of [facility] Education information/materials will improve Outcome: Progressing Patient is a mouth breather and oxygen added while asleep per patient comfort and request with some teach back displayed.  The patient is having increased inspiratory and expiratory wheezing from previous days.  Last Chest X-Ray (CXR) on 10/25/16 with inter-jugular central line placement.  Paged IMTS for CXR this morning since IVF were increased over the last two days and just decreased this afternoon.

## 2016-10-27 NOTE — Progress Notes (Signed)
Spouse unable to discuss plan for pt at this time but plans to come to hospital tomorrow- CSW will meet with pt spouse tomorrow morning in regards to SNF recommendation  CSW will continue to follow  Jorge Ny, [place] Social Worker [phone]

## 2016-10-27 NOTE — Progress Notes (Signed)
Subjective: Patient was looking slightly improved as compared to yesterday. Still complaining of being lethargic and fatigued. Denies any pain. She was complaining of being thirsty, we explained to her again that she cannot take water yet, but we can offer her some ice chips and thickened liquids.  Objective:  Vital signs in last 24 hours: Vitals:   10/27/16 0754 10/27/16 0800 10/27/16 0900 10/27/16 1003  BP: (!) 160/83 (!) 151/75 (!) 169/91 (!) 158/87  Pulse: 87 83 88 88  Resp: 17 18 18 14   Temp: 97.4 F (36.3 C)     TempSrc: Oral     SpO2: 98% 99% 98% 100%  Weight:      Height:       Gen.  lethargic and fatigued, looks slightly improved as compared to yesterday, in no acute distress.  HEENT. Soft and hoarse voice, Voice was slightly improved today, dry lips and mucous membranes. Lungs. Upper respiratory course transmitted sounds with few scattered expiratory wheezing. CV. Regular rate and rhythm , no murmurs/gallop or rub. Abdomen. Soft, non tender, non distended, bowel sounds positive. Extremities. No edema, no cyanosis, pulses 2+ bilaterally.  Labs. BMP Latest Ref Rng & Units 10/27/2016 10/26/2016 10/25/2016  Glucose 65 - 99 mg/dL 95 205(H) 193(H)  BUN 6 - 20 mg/dL 77(H) 89(H) 102(H)  Creatinine 0.44 - 1.00 mg/dL 2.35(H) 2.43(H) 2.82(H)  BUN/Creat Ratio 12 - 28 - - -  Sodium 135 - 145 mmol/L 146(H) 148(H) 153(H)  Potassium 3.5 - 5.1 mmol/L 3.5 3.8 3.9  Chloride 101 - 111 mmol/L 113(H) 116(H) 121(H)  CO2 22 - 32 mmol/L 23 23 21(L)  Calcium 8.9 - 10.3 mg/dL 8.3(L) 8.5(L) 9.0   PO4. 4.3 Mag. 1.6  Assessment/Plan:  Joy Butler, 65 y. 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 She was found to have multifocal pneumonia on chest x-ray, extubated on November 2,after 10 days of intubation.  Electrolyte abnormalities. Her hypernatremia is improving, today her sodium was 146. Her  BUN/creatinine are improving. Her magnesium was low. She is now on maintenance fluid of 5% dextrose with half saline, 75 mL per hour. -Replace magnesia -Continue maintenance fluid for another day. -We'll check urine and serum osmolalities tomorrow. -Recheck BMP tomorrow. -Recheck magnesium tomorrow.  Dysphagia and hoarse voice.Most probably due to prolonged intubation. She did better on modified barium studies yesterday and was able to swallow thicker nectar. She had her flexible fiberoptic laryngoscopy done yesterday, which shows bilateral posterior vocal cord  Ulceration, vocal cords were mobile. Her injury was consistent with prolonged intubation Trauma. It will resolve on its own. -Treat for any possible reflex to prevent further irritation. -Continue with dysphagia type I diet, proceed according to the recommendation of swallow team. -Continue speech and swallow therapy.  Acute on chronic kidney disease. Her renal functions are improving. BUN of 77 and creatinine of 2.35 today. Her creatinine is pretty much at her baseline now. -Continue intravenous fluid for 1 more day and then reassess. -Daily BMP.  Hypertension.Her blood pressure is still mildly elevated, improved than before. She required just one extra dose of hydralazine yesterday. -Continue home dose of amlodipine 10 mg daily and labetalol when necessary.  Strep pneumonia bacteremia and pneumonia.Marland Kitchen Resolved on her repeat chest x-ray done on Saturday. She has completed her antibiotic course. She is saturating well on room air. She will need inpatient rehabilitation because of her severe deconditioning once medically stable.  Diabetes.Last A1c was 6.2 on 07/06/2016.Her CBG stays between 64-209 over the last 24 hour. She had one episode of hypoglycemia. Continue sliding scale,will add Lantus onceher by mouth intake improves.  History of reoccurring PE. Continue Lovenox.she can be discharged on home dose of  xarelto.    Dispo: Anticipated discharge in approximately 2-3 day(s).   Joy Nimrod, MD 10/27/2016, 10:56 AM Pager: [phone]

## 2016-10-27 NOTE — Progress Notes (Signed)
Physical Therapy Treatment Patient Details Name: Joy Butler MRN: [id number] DOB: 01/08/1951 Today's Date: 10/27/2016    History of Present Illness 65 yo admitted with CAP with VDRF 10/22-10/29 with reintubation same day, bronchoscopy 10/31, extubated 11/2. PMhx: arthritis, back pain, DM, neuropathy, HTN, falls, PE    PT Comments    Patient progressing towards PT goals, tolerated part task transfer training x3 this session, performed additional therapeutic exercises. Patient remains limited with overall strength and conditioning, continues to require increased physical assist for mobility. Continue to recommend CIR.   Follow Up Recommendations  CIR;Supervision/Assistance - 24 hour     Equipment Recommendations  None recommended by PT    Recommendations for Other Services OT consult;Rehab consult     Precautions / Restrictions Precautions Precautions: Fall Restrictions Weight Bearing Restrictions: No    Mobility  Bed Mobility Overal bed mobility: Needs Assistance Bed Mobility: Rolling;Supine to Sit;Sit to Supine Rolling: Modified independent (Device/Increase time)   Supine to sit: Mod assist Sit to supine: Supervision   General bed mobility comments: assist to guide LEs off bed and raise trunk, rolls using rail without assist  Transfers     Transfers: Sit to/from Stand;Stand Pivot Transfers Sit to Stand: Max assist;From elevated surface;+2 physical assistance Stand pivot transfers: Max assist;From elevated surface;+2 physical assistance       General transfer comment: performed x3 this session for part task training  Ambulation/Gait                 Stairs            Wheelchair Mobility    Modified Rankin (Stroke Patients Only)       Balance Overall balance assessment: Needs assistance   Sitting balance-Leahy Scale: Fair Sitting balance - Comments: min guard, no use of UEs     Standing balance-Leahy Scale: Poor Standing balance  comment: heavy reliance on UE support                    Cognition Arousal/Alertness: Awake/alert Behavior During Therapy: WFL for tasks assessed/performed Overall Cognitive Status: Impaired/Different from baseline Area of Impairment: Memory;Safety/judgement;Attention;Problem solving   Current Attention Level: Sustained Memory: Decreased short-term memory   Safety/Judgement: Decreased awareness of safety;Decreased awareness of deficits   Problem Solving: Slow processing;Decreased initiation;Requires verbal cues;Requires tactile cues;Difficulty sequencing      Exercises General Exercises - Lower Extremity Ankle Circles/Pumps: AROM;Both;10 reps Long Arc Quad: AROM;Both;10 reps Heel Slides: AROM;Both;10 reps Straight Leg Raises: AROM;Both;10 reps    General Comments        Pertinent Vitals/Pain Pain Assessment: No/denies pain    Home Living                      Prior Function            PT Goals (current goals can now be found in the care plan section) Acute Rehab PT Goals Patient Stated Goal: drink water PT Goal Formulation: With patient Time For Goal Achievement: 11/04/16 Potential to Achieve Goals: Good Progress towards PT goals: Progressing toward goals    Frequency    Min 3X/week      PT Plan Current plan remains appropriate    Co-evaluation             End of Session Equipment Utilized During Treatment: Gait belt;Oxygen Activity Tolerance: Patient tolerated treatment well Patient left: in chair;with call bell/phone within reach;with chair alarm set     Time: [id number] PT Time Calculation (min) (  ACUTE ONLY): 28 min  Charges:  $Therapeutic Exercise: 8-22 mins $Therapeutic Activity: 8-22 mins                    G CodesDuncan Dull 15-Nov-2016, 1:37 PM Alben Deeds, [place] DPT  [phone]

## 2016-10-27 NOTE — Progress Notes (Signed)
Speech Language Pathology Treatment: Dysphagia  Patient Details Name: Joy Butler MRN: [id number] DOB: [birth year] Today's Date: 10/27/2016 Time: [id number] SLP Time Calculation (min) (ACUTE ONLY): 10 min  Assessment / Plan / Recommendation Clinical Impression  F/u after yesterday's MBS.  Pt was seen by ENT yesterday, with findings of: "Vocal cords are mobile, bilateral posterior cord ulcerations, consistent with prolonged intubation trauma."  Pt with persisting wheeze, rhonchi in lungs.  RN states pt coughed with POs in bed this am, but toleration was better when seated in recliner.  During f/u today, pt demonstrated adequate toleration of honey-thick liquids with mod cues for slower rate.  Confusion/short-term memory remain an issue - reviewed necessity of thick liquids three times, but pt continued to ask for water and unable to recall results of MBS in simple terms.  Continue current diet- hold POs if pt is coughing with meals; please seat as upright as possible, or ideally, transfer pt to chair for meals.  SLP will f/u next date for safety/education.    HPI   [name] with PMH of PE (non-compliant with xarelto), HTN, CKD, HLD admitted 10/22 per IMTS with multifocal PNA. Developed worsening respiratory failure requiring intubation 10/22-10/29 with reintubation 10/29-11/2.            SLP Plan  Continue with current plan of care     Recommendations  Diet recommendations: Dysphagia 1 (puree);Honey-thick liquid Liquids provided via: Cup Medication Administration: Crushed with puree Supervision: Full supervision/cueing for compensatory strategies Compensations: Slow rate;Minimize environmental distractions;Small sips/bites Postural Changes and/or Swallow Maneuvers: Out of bed for meals                Oral Care Recommendations: Oral care BID Plan: Continue with current plan of care       GO                Juan Quam Laurice 10/27/2016, 2:47 PM

## 2016-10-27 NOTE — Progress Notes (Signed)
Inpatient Diabetes Program Recommendations  AACE/ADA: New Consensus Statement on Inpatient Glycemic Control (2015)  Target Ranges:  Prepandial:   less than 140 mg/dL      Peak postprandial:   less than 180 mg/dL (1-2 hours)      Critically ill patients:  140 - 180 mg/dL   Results for Joy Butler, Joy Butler (MRN [id number]) as of 10/27/2016 09:42  Ref. Range 10/26/2016 04:31 10/26/2016 06:19 10/26/2016 08:35 10/26/2016 12:59 10/26/2016 16:52 10/26/2016 16:55 10/26/2016 20:05 10/27/2016 00:23 10/27/2016 03:43 10/27/2016 04:28 10/27/2016 07:52  Glucose-Capillary Latest Ref Range: 65 - 99 mg/dL 260 (H) 189 (H) 128 (H) 166 (H) 65 71 124 (H) 209 (H) 64 (L) 100 (H) 115 (H)   Review of Glycemic Control  Current orders for Inpatient glycemic control: Novolog 2-6 units Q4H  Inpatient Diabetes Program Recommendations: Correction (SSI): Currently ordered Novolog 2-6 units Q4H per ICU Glycemic Control order set. Please discontinue ICU Glycemic control order set and current Novolog orders then use regular Glycemic Control order set to order CBGs with Novolog 0-9 units TID with meals and Novolog 0-5 units QHS.  NOTE: Patient has experienced hypoglycemia x 2 over the past 24 hours after receiving Novolog correction per ICU Glycemic Control order set.   Thanks, Barnie Alderman, RN, MSN, CDE Diabetes Coordinator Inpatient Diabetes Program [phone] (Team Pager from 8am to 5pm)

## 2016-10-27 NOTE — Final Progress Note (Signed)
Hypoglycemic Event  CBG: 64  Treatment: Orange - Nectar Thick  Symptoms: Patient is asymptomatic; Arousable  Follow-up CBG Result: 100 blood stick and 95 lab draw  Possible Reasons for Event: Patient may need a different insulin sliding scale  Comments/MD notified: IMTS - Intern - Stopped by in person    Joy Butler

## 2016-10-28 LAB — GLUCOSE, CAPILLARY
GLUCOSE-CAPILLARY: 195 mg/dL — AB (ref 65–99)
Glucose-Capillary: 135 mg/dL — ABNORMAL HIGH (ref 65–99)
Glucose-Capillary: 144 mg/dL — ABNORMAL HIGH (ref 65–99)
Glucose-Capillary: 128 mg/dL — ABNORMAL HIGH (ref 65–99)

## 2016-10-28 LAB — BASIC METABOLIC PANEL
Anion gap: 7 (ref 5–15)
BUN: 64 mg/dL — AB (ref 6–20)
CHLORIDE: 113 mmol/L — AB (ref 101–111)
CO2: 22 mmol/L (ref 22–32)
Calcium: 8.1 mg/dL — ABNORMAL LOW (ref 8.9–10.3)
Creatinine, Ser: 2.2 mg/dL — ABNORMAL HIGH (ref 0.44–1.00)
GFR calc Af Amer: 25 mL/min — ABNORMAL LOW (ref >60)
GFR calc non Af Amer: 22 mL/min — ABNORMAL LOW (ref >60)
Glucose, Bld: 168 mg/dL — ABNORMAL HIGH (ref 65–99)
POTASSIUM: 3.7 mmol/L (ref 3.5–5.1)
SODIUM: 142 mmol/L (ref 135–145)

## 2016-10-28 LAB — OSMOLALITY: OSMOLALITY: 319 mosm/kg — AB (ref 275–295)

## 2016-10-28 LAB — OSMOLALITY, URINE: OSMOLALITY UR: 410 mosm/kg (ref 300–900)

## 2016-10-28 LAB — PHOSPHORUS: PHOSPHORUS: 4.6 mg/dL (ref 2.5–4.6)

## 2016-10-28 LAB — PROTIME-INR
INR: 1.33
Prothrombin Time: 16.6 seconds — ABNORMAL HIGH (ref 11.4–15.2)

## 2016-10-28 LAB — MAGNESIUM: MAGNESIUM: 2.1 mg/dL (ref 1.7–2.4)

## 2016-10-28 NOTE — Care Management Important Message (Signed)
Important Message  Patient Details  Name: Joy Butler MRN: [id number] Date of Birth: 1950/12/30   Medicare Important Message Given:  Yes    [name] May 10/28/2016, 10:44 AM

## 2016-10-28 NOTE — Progress Notes (Signed)
   Subjective: She was slightly better today, had better cough, voice was improving. She is still aspirating on thin liquids, as she was again complaining of being thirsty and asking for water.  Objective:  Vital signs in last 24 hours: Vitals:   10/28/16 0804 10/28/16 0908 10/28/16 1227 10/28/16 1311  BP: (!) 159/90  (!) 158/73   Pulse: 89  97   Resp: 17     Temp: 97.8 F (36.6 C)  97.8 F (36.6 C)   TempSrc: Oral  Oral   SpO2: 96% 97%  96%  Weight:      Height:       Gen. lethargic and fatigued, looks slightly improved as compared to yesterday. HEENT. Soft and hoarse voice, Voice  slowly improving,dry lips and mucous membranes. Lungs. Upper respiratory course transmitted sounds with few scattered expiratory wheezing. CV. Regular rate and rhythm ,no murmurs/gallop or rub. Abdomen.Soft, non tender,non distended, bowelsounds positive. Extremities. No edema, no cyanosis, pulses 2+ bilaterally.  Labs. BMP Latest Ref Rng & Units 10/28/2016 10/27/2016 10/26/2016  Glucose 65 - 99 mg/dL 168(H) 95 205(H)  BUN 6 - 20 mg/dL 64(H) 77(H) 89(H)  Creatinine 0.44 - 1.00 mg/dL 2.20(H) 2.35(H) 2.43(H)  BUN/Creat Ratio 12 - 28 - - -  Sodium 135 - 145 mmol/L 142 146(H) 148(H)  Potassium 3.5 - 5.1 mmol/L 3.7 3.5 3.8  Chloride 101 - 111 mmol/L 113(H) 113(H) 116(H)  CO2 22 - 32 mmol/L 22 23 23   Calcium 8.9 - 10.3 mg/dL 8.1(L) 8.3(L) 8.5(L)   Assessment/Plan:  Joy Butler, 65 y. 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 She was found to have multifocal pneumonia on chest x-ray, extubated on November 2,after 10 days of intubation.  Electrolyte abnormalities. Her hypernatremia resolved today. Magnesium was normal after replacement. -Continue maintenance fluid as she is unable to take much liquid. -Recheck BMP tomorrow.  Dysphagia and hoarse voice. Her voice and cough reflex are improving. She  started coughing when drinking nectar rapidly. Speech suggesting stop any nectar and just give her pure diet. We should keep maintenance fluid. She had good muscular contraction on modified barium, AP her vocal cord ulceration is causing this dysphagia.  Acute on chronic kidney disease. Her renal functions are improving. BUN of 64 and creatinine of 2.20 today. -Daily BNP.  Hypertension.Her blood pressure is still mildly elevated,  -Continue home dose of amlodipine 10 mg daily and labetalol when necessary.  Strep pneumonia bacteremia and pneumonia.Marland KitchenResolved on her repeat chest x-ray done on Saturday. She has completed her antibiotic course. She is saturating well on room air. Inpatient rehabilitation physical therapist are suggesting SNIF, as she is unable to participate in intense therapy.  Diabetes.Last A1c was 6.2 on 07/06/2016.Her CBG stays between 135-195over the last 24 hour. -Continue sliding scale.  History of reoccurring PE.Continue Lovenox.she can be discharged on home dose of xarelto.  Dispo: Anticipated discharge in approximately 2-3 day(s).   Joy Nimrod, MD 10/28/2016, 3:16 PM Pager: [phone]

## 2016-10-28 NOTE — Progress Notes (Signed)
ANTICOAGULATION CONSULT NOTE - Follow Up Consult  Pharmacy Consult for Lovenox Indication: History of pulmonary embolus  Allergies  Allergen Reactions  . Keflex [Cephalexin] Itching and Swelling    Lips and eyes swelled up  . Clindamycin/Lincomycin Itching  . Latex Itching  . Sulfa Antibiotics Rash    Patient Measurements: Height: 5\' 9"  (175.3 cm) Weight: 196 lb 10.4 oz (89.2 kg) IBW/kg (Calculated) : 66.2 Heparin Dosing Weight: 86 kg  Vital Signs: Temp: 97.8 F (36.6 C) (11/08 0804) Temp Source: Oral (11/08 0804) BP: 159/90 (11/08 0804) Pulse Rate: 89 (11/08 0804)  Labs:  Recent Labs  10/26/16 0600 10/27/16 0245 10/28/16 0405  LABPROT 18.3* 17.8* 16.6*  INR 1.50 1.45 1.33  CREATININE 2.43* 2.35* 2.20*    Estimated Creatinine Clearance: 29.5 mL/min (by C-G formula based on SCr of 2.2 mg/dL (H)).  Assessment: 66 yo female w/ history of PE on Xarelto PTA. Transitioned to Lovenox due NPO status and poor renal function.   Goal of Therapy:  Anti-Xa level 0.6-1 units/ml 4hrs after LMWH dose given Monitor platelets by anticoagulation protocol: Yes   Plan:  1. Continue Lovenox 90 mg sq Q 24 hours based on renal function 2. Follow up long term anticoagulation plans  3. CBC in am    Vincenza Hews, PharmD, BCPS 10/28/2016, 8:05 AM Pager: [phone]

## 2016-10-28 NOTE — Progress Notes (Signed)
Speech Language Pathology Treatment: Dysphagia  Patient Details Name: Joy Butler MRN: [id number] DOB: Jun 12, 1951 Today's Date: 10/28/2016 Time: [id number] SLP Time Calculation (min) (ACUTE ONLY): 20 min  Assessment / Plan / Recommendation Clinical Impression  Ongoing f/u for dysphagia.  Pt found with nectar-thick liquids on tray; she was drinking rapidly and coughing, likely aspirating.  Spouse present.  Liquids removed.  Reviewed precautions with pt and her husband - nectar thick liquids were grossly aspirated during Monday's MBS.  Honey-thick liquids were assessed multiple times and were swallowed safely.  Deficit is likely caused by bilateral ulcerations on vocal folds, which can affect complete adduction and lead to aspiration.  Pt continues with upper airway noise, decreased breath sounds at bases.  Spoke at length with husband, showed photos of vocal folds (normal and with ulcers) and explained the necessity of preventing aspiration.  He verbalizes agreement; Mrs. Aguino remains confused and unable to follow through with recommendations independently.    For today, recommend continuing dysaphagia 1, IV hydration - d/c liquids for now.  Discussed with Dr. Reesa Chew.  SLP will follow for safety and readiness for liquids; pt will need time for ulcers to heal.     HPI HPI: [name] with PMH of PE (non-compliant with xarelto), HTN, CKD, HLD admitted 10/22 per IMTS with multifocal PNA. Developed worsening respiratory failure requiring intubation 10/22-10/29 with reintubation 10/29-11/2.  MBS 11/6 revealed moderate dysphagia with gross penetration with liquids nectar or thinner.  Pt did not aspirate honey thick liquids on multiple trials. ENT eval revealed bilateral ulcers on vocal folds secondary to intubations - their presence explains dysphagia.       SLP Plan  Continue with current plan of care     Recommendations  Diet recommendations: Dysphagia 1 (puree) (no liquids) Medication  Administration: Crushed with puree Supervision: Full supervision/cueing for compensatory strategies Compensations: Slow rate;Minimize environmental distractions;Small sips/bites Postural Changes and/or Swallow Maneuvers: Out of bed for meals                Oral Care Recommendations: Oral care BID Plan: Continue with current plan of care       GO               Kentley Cedillo L. Tivis Ringer, [place] CCC/SLP Pager [phone]  Juan Quam Laurice 10/28/2016, 1:56 PM

## 2016-10-28 NOTE — Progress Notes (Signed)
Pt not at a level for intense inpt rehab admission at this time. Recommend SNF and noted SW is facilitating. We will sign off. [phone]

## 2016-10-28 NOTE — Progress Notes (Signed)
RT had RN contact provider about patients stridor and accessory muscle use. RT will continue to monitor.

## 2016-10-28 NOTE — Progress Notes (Signed)
MD notified about patients stridor and accessory muscle use. MD informed RN that this was not new. RN will continue to monitor.

## 2016-10-28 NOTE — Plan of Care (Signed)
Problem: Nutrition: Goal: Adequate nutrition will be maintained Outcome: Not Progressing Pt needing further teaching about thickened liquids.

## 2016-10-28 NOTE — Progress Notes (Signed)
  Date: 10/28/2016  Patient name: Joy Butler  Medical record number: [id number]  Date of birth: February 01, 1951   This patient's plan of care was discussed with the house staff. Please see their note for complete details. I concur with their findings. Does not feel better today Is phonating better. Denies difficulty with po this AM.   Vitals:   10/28/16 0550 10/28/16 0804  BP: (!) 166/87 (!) 159/90  Pulse: 93 89  Resp: (!) 22 17  Temp: 97.6 F (36.4 C) 97.8 F (36.6 C)   CV: RRR Chest: upper airway noise, decreased breath sounds at bases.  Abd: BS+, soft, nontender.   Labs: Na 142   Cr 2.20  A/P HyperNatremia Normalized. Will continue to watch as she is moved away from IV fluids to relying on po.   Dysphagia Repeat swallow eval today  Vocal Cord Ulcers Possible repeat layrnygoscopy if not improving.   Campbell Riches, MD 10/28/2016, 11:50 AM

## 2016-10-28 NOTE — Clinical Social Work Note (Signed)
Clinical Social Work Assessment  Patient Details  Name: Joy Butler MRN: [id number] Date of Birth: 1951/03/13  Date of referral:  10/28/16               Reason for consult:  Facility Placement                Permission sought to share information with:  Family Supports Permission granted to share information::  No (DO)  Name::     Doctor, hospital::  SNF  Relationship::  spouse  Contact Information:     Housing/Transportation Living arrangements for the past 2 months:  Single Family Home Source of Information:  Spouse Patient Interpreter Needed:  None Criminal Activity/Legal Involvement Pertinent to Current Situation/Hospitalization:  No - Comment as needed Significant Relationships:  Adult Children, Spouse Lives with:  Spouse Do you feel safe going back to the place where you live?  No Need for family participation in patient care:  Yes (Comment) (decision making at this time)  Care giving concerns:  Pt lives at home with spouse but spouse currently unable to care for pt with level of impairment.   Social Worker assessment / plan:  CSW spoke with pt spouse concerning plan for pt at time of DC.  Explained PT recommendation for SNF and SNF referral process.  Employment status:    Nurse, adult PT Recommendations:  [place] / Referral to community resources:  [place]  Patient/Family's Response to care:  Pt spouse agreeable to MD/PT recommendations but expressed concerns about payment- CSW explained Medicare rules for SNF payment.  Patient/Family's Understanding of and Emotional Response to Diagnosis, Current Treatment, and Prognosis:  Pt hopeful pt will recover quickly and be able to return home but main concern is that she gets appropriate level of care.  Emotional Assessment Appearance:  Appears stated age Attitude/Demeanor/Rapport:    Affect (typically observed):  Appropriate Orientation:  Oriented  to Self, Oriented to Place Alcohol / Substance use:  Not Applicable Psych involvement (Current and /or in the community):  No (Comment)  Discharge Needs  Concerns to be addressed:  Care Coordination Readmission within the last 30 days:  No Current discharge risk:  Physical Impairment Barriers to Discharge:  Continued Medical Work up   Jorge Ny, LCSW 10/28/2016, 12:03 PM

## 2016-10-29 DIAGNOSIS — R061 Stridor: Secondary | ICD-10-CM

## 2016-10-29 DIAGNOSIS — J383 Other diseases of vocal cords: Secondary | ICD-10-CM

## 2016-10-29 LAB — CBC WITH DIFFERENTIAL/PLATELET
BASOS PCT: 0 %
Basophils Absolute: 0 10*3/uL (ref 0.0–0.1)
Eosinophils Absolute: 0.4 10*3/uL (ref 0.0–0.7)
Eosinophils Relative: 4 %
HEMATOCRIT: 30.7 % — AB (ref 36.0–46.0)
HEMOGLOBIN: 9.6 g/dL — AB (ref 12.0–15.0)
LYMPHS ABS: 2.7 10*3/uL (ref 0.7–4.0)
LYMPHS PCT: 25 %
MCH: 26.5 pg (ref 26.0–34.0)
MCHC: 31.3 g/dL (ref 30.0–36.0)
MCV: 84.8 fL (ref 78.0–100.0)
MONO ABS: 0.8 10*3/uL (ref 0.1–1.0)
MONOS PCT: 7 %
NEUTROS ABS: 6.9 10*3/uL (ref 1.7–7.7)
NEUTROS PCT: 64 %
Platelets: 412 10*3/uL — ABNORMAL HIGH (ref 150–400)
RBC: 3.62 MIL/uL — ABNORMAL LOW (ref 3.87–5.11)
RDW: 17.5 % — AB (ref 11.5–15.5)
WBC: 10.8 10*3/uL — ABNORMAL HIGH (ref 4.0–10.5)

## 2016-10-29 LAB — GLUCOSE, CAPILLARY
GLUCOSE-CAPILLARY: 159 mg/dL — AB (ref 65–99)
Glucose-Capillary: 110 mg/dL — ABNORMAL HIGH (ref 65–99)
Glucose-Capillary: 165 mg/dL — ABNORMAL HIGH (ref 65–99)
Glucose-Capillary: 184 mg/dL — ABNORMAL HIGH (ref 65–99)
Glucose-Capillary: 97 mg/dL (ref 65–99)

## 2016-10-29 LAB — BASIC METABOLIC PANEL
ANION GAP: 7 (ref 5–15)
BUN: 48 mg/dL — ABNORMAL HIGH (ref 6–20)
CALCIUM: 8.2 mg/dL — AB (ref 8.9–10.3)
CO2: 22 mmol/L (ref 22–32)
CREATININE: 2.05 mg/dL — AB (ref 0.44–1.00)
Chloride: 113 mmol/L — ABNORMAL HIGH (ref 101–111)
GFR, EST AFRICAN AMERICAN: 28 mL/min — AB (ref >60)
GFR, EST NON AFRICAN AMERICAN: 24 mL/min — AB (ref >60)
GLUCOSE: 114 mg/dL — AB (ref 65–99)
Potassium: 3.7 mmol/L (ref 3.5–5.1)
Sodium: 142 mmol/L (ref 135–145)

## 2016-10-29 LAB — PROTIME-INR
INR: 1.16
PROTHROMBIN TIME: 14.8 s (ref 11.4–15.2)

## 2016-10-29 LAB — PHOSPHORUS: Phosphorus: 4.1 mg/dL (ref 2.5–4.6)

## 2016-10-29 LAB — MAGNESIUM: Magnesium: 1.9 mg/dL (ref 1.7–2.4)

## 2016-10-29 MED ORDER — GUAIFENESIN-CODEINE 100-10 MG/5ML PO SOLN
5.0000 mL | ORAL | Status: DC | PRN
  Administered 2016-10-30 – 2016-11-01 (×2): 5 mL via ORAL
  Filled 2016-10-29 (×2): qty 5

## 2016-10-29 MED ORDER — DEXAMETHASONE SODIUM PHOSPHATE 4 MG/ML IJ SOLN
4.0000 mg | Freq: Once | INTRAMUSCULAR | Status: AC
  Administered 2016-10-29: 4 mg via INTRAVENOUS
  Filled 2016-10-29: qty 1

## 2016-10-29 MED ORDER — DEXTROSE-NACL 5-0.45 % IV SOLN
INTRAVENOUS | Status: DC
Start: 2016-10-29 — End: 2016-10-30
  Administered 2016-10-29 – 2016-10-30 (×2): via INTRAVENOUS

## 2016-10-29 NOTE — Progress Notes (Signed)
Speech Language Pathology Treatment: Dysphagia  Patient Details Name: Joy Butler MRN: [id number] DOB: 07-Oct-1951 Today's Date: 10/29/2016 Time: [id number] SLP Time Calculation (min) (ACUTE ONLY): 13 min  Assessment / Plan / Recommendation Clinical Impression  F/u diet tolerance assessment complete. Patient continues to present with stridor, loud upper airway noise on both inhalation and exhalation. Po trials provided. Patient able to consume pureed solids without overt indication of aspiration however even with controlled boluses of honey thick liquid via tsp, cough present. Will continue on solids only. Hopeful for ability tore-advance diet to include liquids 11/10 with continued improvement. Patient reporting frustration over inability to consume liquids. MD, may need to address hydration in the short term.    HPI HPI: [name] with PMH of PE (non-compliant with xarelto), HTN, CKD, HLD admitted 10/22 per IMTS with multifocal PNA. Developed worsening respiratory failure requiring intubation 10/22-10/29 with reintubation 10/29-11/2.  MBS 11/6 revealed moderate dysphagia with gross penetration with liquids nectar or thinner.  Pt did not aspirate honey thick liquids on multiple trials. ENT eval revealed bilateral ulcers on vocal folds secondary to intubations - their presence explains dysphagia.       SLP Plan  Continue with current plan of care     Recommendations  Diet recommendations: Dysphagia 1 (puree) (no liquids) Medication Administration: Crushed with puree Supervision: Full supervision/cueing for compensatory strategies Compensations: Slow rate;Minimize environmental distractions;Small sips/bites Postural Changes and/or Swallow Maneuvers: Seated upright 90 degrees                Oral Care Recommendations: Oral care BID Plan: Continue with current plan of care       [place], CCC-SLP [phone]    Sisto Granillo Meryl 10/29/2016, 3:35  PM

## 2016-10-29 NOTE — Progress Notes (Addendum)
PULMONARY / CRITICAL CARE MEDICINE   Name: Joy Butler MRN: [id number] DOB: [birth year]    ADMISSION DATE:  10/10/2016 CONSULTATION DATE:  10/11/16   REFERRING MD:  Dr. Reesa Chew / IMTS   CHIEF COMPLAINT:  Multi-focal PNA / Acute Respiratory Failure   Brief:  [name] with PMH of PE (non-compliant with xarelto), HTN, CKD, HLD admitted 10/22 per IMTS with multifocal PNA.  Developed worsening respiratory failure requiring intubation in ER. Found to have Strep pneumo bacteremia. Extubated 10/29 but reintubated for respiratory secretion. [name] with PMH of PE (non-compliant with xarelto), HTN, CKD, HLD admitted 10/22 per IMTS with multifocal PNA.  Developed worsening respiratory failure requiring intubation in ER. Found to have Strep pneumo bacteremia. Extubated 10/29 but reintubated for respiratory secretion. Then extubated 11/2 successfully, but voice remained hoarse improved with short term decadron. Evaluated by ENT with vocal cord ulcerations consistent with prolonged intubation, but no additional findings.  Now 11/9 staff complaining of worsening stridor. PCCM asked to re-evaluate.   STUDIES:  ECHO 10/22: LVEF 40-45% with mild LDH and G1DD (worse compared to EF of 50-55% 08/2012) CT Chest 10/22 >> extensive bilateral consolidative changes consistent with multifocal pneumonia, mild cardiomegaly with multivessel coronary disease  CULTURES: Influenza 10/22: negative BCx2 10/22 >> strep pneumo UA 10/22: proteinuria, small hgb, moderate bilirubin, negative nitrites and leukocytes; rbc 0-5 on add-on UC 10/22: < 10,000 colonies Strep pneumo urinary Ag 10/22: positive Respiratory panel 10/22: Negative  ANTIBIOTICS: Vanco 10/22 >> 10/24, 10/31>> Levaquin 10/22 >> 10/24 PRIMAXIN 10/31>>   LINES/TUBES: ETT 10/22 >> 10/29>Resp secretion>10/29>>11/02 PIV x 2>> Foley 10/22 >>   SIGNIFICANT EVENTS: 10/22  Admit per IMTS with PNA, failed BiPAP & required intubation 11/1: Walked without  complications.  11/2: successfully extubated to 4L by [place].  11/6 ENT eval vocal cord ulceration 11/9 worsening hoarseness. PCCM asked to see again.  Subjective:  Remains hoarse. She feels as though her breathing has been getting better, but staff thinks this is worsening.  VITAL SIGNS: BP (!) 154/84 (BP Location: Right Arm)   Pulse 84   Temp 97.9 F (36.6 C) (Oral)   Resp 19   Ht 5\' 9"  (1.753 m)   Wt 87.1 kg (192 lb 0.3 oz)   SpO2 96%   BMI 28.36 kg/m   HEMODYNAMICS:  Stable  VENTILATOR SETTINGS:    INTAKE / OUTPUT: I/O last 3 completed shifts: In: 1140 [P.O.:240; I.V.:900] Out: 700 [Urine:700]  PHYSICAL EXAMINATION: General:  Chronically ill appearing female, NAD, lying in bed Neuro:  answers to questions and following commands HEENT:  MM pink/dry, no jvd. Slight stridor. Weak voice/ hoarse Cardiovascular:  s1s2 rrr, no m/r/g, diminished DP and PT pulses in LE's Lungs: upper air sounds, improved  Abdomen:  Obese/soft, no TTP, bs very faint Musculoskeletal:  No acute deformities, BLE symmetrical  Skin:  Cold extremity, no edema  LABS:  BMET  Recent Labs Lab 10/27/16 0245 10/28/16 0405 10/29/16 0357  NA 146* 142 142  K 3.5 3.7 3.7  CL 113* 113* 113*  CO2 23 22 22   BUN 77* 64* 48*  CREATININE 2.35* 2.20* 2.05*  GLUCOSE 95 168* 114*    Electrolytes  Recent Labs Lab 10/27/16 0245 10/28/16 0405 10/29/16 0357  CALCIUM 8.3* 8.1* 8.2*  MG 1.6* 2.1 1.9  PHOS 4.3 4.6 4.1   CBC  Recent Labs Lab 10/24/16 0552 10/25/16 0513 10/29/16 0357  WBC 12.6* 12.2* 10.8*  HGB 12.0 11.3* 9.6*  HCT 37.0 35.0* 30.7*  PLT 645* 700* 412*   Coag's  Recent Labs Lab 10/27/16 0245 10/28/16 0405 10/29/16 0357  INR 1.45 1.33 1.16   Sepsis Markers No results for input(s): LATICACIDVEN, PROCALCITON, O2SATVEN in the last 168 hours. ABG No results for input(s): PHART, PCO2ART, PO2ART in the last 168 hours. Liver Enzymes No results for input(s): AST, ALT,  ALKPHOS, BILITOT, ALBUMIN in the last 168 hours. Cardiac Enzymes No results for input(s): TROPONINI, PROBNP in the last 168 hours. Glucose  Recent Labs Lab 10/28/16 1641 10/28/16 2122 10/29/16 0834 10/29/16 1235 10/29/16 1555 10/29/16 2040  GLUCAP 144* 128* 110* 165* 184* 97   Imaging No results found. S DISCUSSION: [name] with PMH of PE (non-compliant with xarelto), HTN, CKD, HLD admitted 10/22 per IMTS with multifocal PNA.  Developed worsening respiratory failure requiring intubation in ER. Found to have Strep pneumo bacteremia. Extubated 10/29 but reintubated for respiratory secretion. Extubated 10/29 but reintubated for respiratory secretion. Then extubated 11/2 successfully, but voice remained hoarse improved with short term decadron. Evaluated by ENT with vocal cord ulcerations consistent with prolonged intubation, but no additional findings. Hoarseness remains.    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 Staff feels this is worsening, pt thinks getting better. She has hoarse cough, which is frequent and is undoubtedly exacerbating this.   - Will try Decadron 4mg  IV now - Cough suppression, will order Robitussin with codeine - Could consider racemic epi, but without true edema, doubt there is a role for this. - Maximize GERD tx - Suspect with good cough suppression this should improve with time - If worsens, may need re-intubation.  Dysphagia -per SLP recommendation  Strep pneumonia bacteremia and pneumonia causing ARDSResolved on her repeat chest x-ray, off ABX -Continue working with PT and OT.  DM PE HTN -Per primary  Georgann Housekeeper, AGACNP-BC [facility] Pulmonology/Critical Care Pager [phone] or [phone]  10/29/2016 8:52 PM   ATTENDING NOTE / ATTESTATION NOTE :   I have discussed the case with the resident/APP  Georgann Housekeeper.   I agree with the resident/APP's  history,  physical examination, assessment, and plans.    I have edited the above note and modified it according to our agreed history, physical examination, assessment and plan.   Patient with complicated course. [name] admitted on 10/22 per IMTS with multifocal PNA.  Developed worsening respiratory failure requiring intubation in ER. Found to have Strep pneumo bacteremia. Extubated 10/29 but reintubated for respiratory secretion. Extubated 10/29 but reintubated for respiratory secretion. Then extubated 11/2 successfully, but voice remained hoarse improved with short term decadron. Evaluated by ENT with vocal cord ulcerations consistent with prolonged intubation, but no additional findings. Hoarseness remain.   Patient seen, in mild distress. I spoke with the team taking care of her and her "distress" has been relatively stable the last several days. She also has a  stridor which has been the same at least the last several days as well. VS as above. Good ae, stridor in tracheal area with some tracheal wheezing. Rest of lungs were clear. Good S1 and S2. Abdomen was benign. Grade 1 edema. Rest of exam as above.  Labs reviewed. CXR on 11/5 with mild cardiomegaly.   Assessment : 1. Stridor related to prolonged intubation/possible VC trauma with intubation and reintubation.  2. VC ulceration.  No evidence for VCD per ENT evaluation.  3. Dysphagia.   Plan : 1. Therapeutic trial with Decadron  for 24 hours. If she improves with Decadron, she may need a longer course.  2. Cough suppressant has been ordered. 3. Trial with Pulmicort neb twice a day. Increase DuoNeb to 4 times a day as well. 4. Encouraged patient to eat as well. Aspiration precaution. 5. If with no clinical improvement the next 24 hours, suggest repeating chest x-ray.  Family : No family at bedside.  Plan discussed with patient.   Monica Becton, MD 10/30/2016, 12:12 AM [place] [facility] [facility] Pager [phone] After 3  pm or if no answer, call [phone]

## 2016-10-29 NOTE — Progress Notes (Signed)
CSW spoke with MD who thinks pt possibly ready in next few days.  CSW spoke with pt husband in regards to choice- informed him we would need decision by Friday morning in order to get insurance auth for possible weekend DC  CSW will continue to follow.  Jorge Ny, LCSW Clinical Social Worker [phone]

## 2016-10-29 NOTE — Progress Notes (Signed)
Physical Therapy Treatment Patient Details Name: Joy Butler MRN: [id number] DOB: 1951/04/12 Today's Date: 10/29/2016    History of Present Illness 65 yo admitted with CAP with VDRF 10/22-10/29 with reintubation same day, bronchoscopy 10/31, extubated 11/2. PMhx: arthritis, back pain, DM, neuropathy, HTN, falls, PE (Simultaneous filing. User may not have seen previous data.)    PT Comments    Pt presented supine in bed with HOB elevated, awake and willing to participate in therapy session. Pt requesting multiple times throughout session to drink water and orange juice. Therapists educated pt on diet restrictions secondary to aspiration risk. Pt not fully understanding these restrictions. Pt making progress with functional mobility and requiring less assistance this session with STS and SPT to recliner. However, pt limited secondary to reports of fatigue. OT and PT co-treating this session to maximize therapeutic benefits. All VSS throughout session. Pt would continue to benefit from skilled physical therapy services at this time while admitted and after d/c to address her limitations in order to improve her overall safety and independence with functional mobility.   Follow Up Recommendations  CIR;Supervision/Assistance - 24 hour     Equipment Recommendations  None recommended by PT    Recommendations for Other Services Rehab consult     Precautions / Restrictions Precautions Precautions: Fall (Simultaneous filing. User may not have seen previous data.) Restrictions Weight Bearing Restrictions: No    Mobility  Bed Mobility Overal bed mobility: Needs Assistance Bed Mobility: Supine to Sit     Supine to sit: Min guard     General bed mobility comments: pt required increased time, use of bed rails and min guard for safety  Transfers Overall transfer level: Needs assistance Equipment used: Rolling walker (2 wheeled) Transfers: Sit to/from [facility] Sit to  Stand: Mod assist;+2 physical assistance Stand pivot transfers: Min assist;+2 physical assistance;+2 safety/equipment       General transfer comment: pt performed STS x2 during session.   Ambulation/Gait             General Gait Details: pt declined at this time secondary to fatigue   Stairs            Wheelchair Mobility    Modified Rankin (Stroke Patients Only)       Balance Overall balance assessment: Needs assistance Sitting-balance support: Feet supported Sitting balance-Leahy Scale: Fair Sitting balance - Comments: min guard for sitting EOB   Standing balance support: During functional activity;Bilateral upper extremity supported Standing balance-Leahy Scale: Poor Standing balance comment: pt reliant on bilateral UEs on RW                    Cognition Arousal/Alertness: Awake/alert (Simultaneous filing. User may not have seen previous data.) Behavior During Therapy: [facility] for tasks assessed/performed (Simultaneous filing. User may not have seen previous data.) Overall Cognitive Status: Impaired/Different from baseline (Simultaneous filing. User may not have seen previous data.) Area of Impairment: Attention;Safety/judgement;Memory (Simultaneous filing. User may not have seen previous data.)   Current Attention Level: Sustained (Simultaneous filing. User may not have seen previous data.) Memory: Decreased short-term memory (Simultaneous filing. User may not have seen previous data.)   Safety/Judgement: Decreased awareness of safety;Decreased awareness of deficits (Simultaneous filing. User may not have seen previous data.)   Problem Solving: Slow processing;Decreased initiation;Requires verbal cues;Requires tactile cues;Difficulty sequencing General Comments: continues to ask for drinks despite education that she has not been cleared for liquids at this time (Simultaneous filing. User may not have seen previous  data.)    Exercises      General  Comments        Pertinent Vitals/Pain Pain Assessment: No/denies pain (Simultaneous filing. User may not have seen previous data.) Faces Pain Scale: No hurt  VSS throughout.    Home Living                      Prior Function            PT Goals (current goals can now be found in the care plan section) Acute Rehab PT Goals Patient Stated Goal: drink water or orange juice PT Goal Formulation: With patient Time For Goal Achievement: 11/04/16 Potential to Achieve Goals: Good Progress towards PT goals: Progressing toward goals    Frequency    Min 3X/week      PT Plan Current plan remains appropriate    Co-evaluation   Reason for Co-Treatment: (P) For patient/therapist safety   OT goals addressed during session: (P) ADL's and self-care     End of Session Equipment Utilized During Treatment: Gait belt;Oxygen Activity Tolerance: Patient limited by fatigue Patient left: in chair;with call bell/phone within reach;with chair alarm set     Time: [id number] PT Time Calculation (min) (ACUTE ONLY): 23 min  Charges:  $Therapeutic Activity: 8-22 mins                    G CodesClearnce Sorrel Telissa Palmisano 2016/11/19, 9:47 AM Sherie Don, PT, DPT [phone]

## 2016-10-29 NOTE — Progress Notes (Signed)
Occupational Therapy Treatment Patient Details Name: Joy Butler MRN: [id number] DOB: 01-27-1951 Today's Date: 10/29/2016    History of present illness 65 yo admitted with CAP with VDRF 10/22-10/29 with reintubation same day, bronchoscopy 10/31, extubated 11/2. PMhx: arthritis, back pain, DM, neuropathy, HTN, falls, PE   OT comments  Pt distracted by desire for orange juice, repeatedly asking for it despite education. Pt needing encouragement to put forth her best effort when standing. Performed x 2 with 2 person assist. Sits abruptly. Pt self fed with minimal spillage with spoon. Continues to need intensive rehab.  Follow Up Recommendations  CIR;Supervision/Assistance - 24 hour    Equipment Recommendations       Recommendations for Other Services      Precautions / Restrictions Precautions Precautions: Fall Restrictions Weight Bearing Restrictions: No       Mobility Bed Mobility Overal bed mobility: Needs Assistance Bed Mobility: Supine to Sit     Supine to sit: Min assist     General bed mobility comments: pulled up on therapist's hand  Transfers Overall transfer level: Needs assistance Equipment used: Rolling walker (2 wheeled) Transfers: Sit to/from [facility] Sit to Stand: +2 physical assistance;Mod assist Stand pivot transfers: +2 physical assistance;Min assist       General transfer comment: performed from bed x 1 and from chair x 1, pt attempting to sit quickly upon standing    Balance Overall balance assessment: Needs assistance Sitting-balance support: Feet supported Sitting balance-Leahy Scale: Fair Sitting balance - Comments: min guard for sitting EOB   Standing balance support: During functional activity;Bilateral upper extremity supported Standing balance-Leahy Scale: Poor Standing balance comment: pt reliant on bilateral UEs on RW                   ADL Overall ADL's : Needs assistance/impaired Eating/Feeding:  Minimal assistance;Sitting Eating/Feeding Details (indicate cue type and reason): minimal spillage seated in chair, only took 2 bites of applesauce, focused on being able to drink Grooming: Wash/dry face;Sitting;Minimal assistance                                        Vision                     Perception     Praxis      Cognition   Behavior During Therapy: Flat affect Overall Cognitive Status: Impaired/Different from baseline Area of Impairment: Memory;Safety/judgement;Attention;Problem solving   Current Attention Level: Sustained Memory: Decreased recall of precautions;Decreased short-term memory    Safety/Judgement: Decreased awareness of safety;Decreased awareness of deficits   Problem Solving: Slow processing;Decreased initiation;Requires verbal cues;Requires tactile cues;Difficulty sequencing General Comments: repeatedly asking for orange juice despite education on rationale     Extremity/Trunk Assessment               Exercises     Shoulder Instructions       General Comments      Pertinent Vitals/ Pain       Pain Assessment: Faces Faces Pain Scale: No hurt  Home Living                                          Prior Functioning/Environment              Frequency  Min 2X/week        Progress Toward Goals  OT Goals(current goals can now be found in the care plan section)  Progress towards OT goals: Progressing toward goals  Acute Rehab OT Goals Patient Stated Goal: to be able to drink Time For Goal Achievement: 11/09/16 Potential to Achieve Goals: [place] Discharge plan remains appropriate    Co-evaluation    PT/OT/SLP Co-Evaluation/Treatment: Yes Reason for Co-Treatment: For patient/therapist safety   OT goals addressed during session: ADL's and self-care      End of Session Equipment Utilized During Treatment: Gait belt;Rolling walker;Oxygen   Activity Tolerance Patient  tolerated treatment well   Patient Left in chair;with call bell/phone within reach;with chair alarm set   Nurse Communication  (liquids on pt's tray)        Time: [id number] OT Time Calculation (min): 25 min  Charges: OT General Charges $OT Visit: 1 Procedure OT Treatments $Self Care/Home Management : 8-22 mins  Malka So 10/29/2016, 10:07 AM  [phone]

## 2016-10-29 NOTE — Progress Notes (Signed)
  Date: 10/29/2016  Patient name: Joy Butler  Medical record number: [id number]  Date of birth: 1951-02-16   This patient's plan of care was discussed with the house staff. Please see their note for complete details. I concur with their findings. Her phonation is better today.  She continues to have dysphagia.  Vitals:   10/29/16 0550 10/29/16 0600  BP: (!) 149/90 (!) 143/73  Pulse: 77 79  Resp: 14 16  Temp:    L IJ clean, non-tender.  Chest- upper airway noise.  CV- RRR abd- BS+, soft, nontender.   Labs: reviewed. Cr improved.   A/P HyperNatremia Na remains normal  ARF on CRI Her Cr has improved.   Dysphagia She continues to work with nutrition and speech.   Plan for SNF when she is able to be off IV hydration.   Campbell Riches, MD 10/29/2016, 11:15 AM

## 2016-10-29 NOTE — Progress Notes (Signed)
   Subjective: Patient was feeling better when seen last morning, asking for water. Her cough and voice is improving.  Objective:  Vital signs in last 24 hours: Vitals:   10/29/16 0600 10/29/16 0838 10/29/16 1100 10/29/16 1232  BP: (!) 143/73   (!) 154/88  Pulse: 79     Resp: 16   15  Temp:    98.5 F (36.9 C)  TempSrc:   Oral Oral  SpO2: 99% 100%  100%  Weight:      Height:       Gen. chronically ill-looking lady, alert and oriented, in no acute distress. HEENT. Able to produce good cough and much better voice. Mildly dry lips and mucous membranes. Lungs. Mostly upper respiratory coarse transmitted sounds. CV. Regular rate and rhythm Abdomen. Soft, nontender, nondistended, bowel sounds positive Extremities. No edema, no cyanosis, pulses 2+ bilaterally.  Labs. CBC Latest Ref Rng & Units 10/29/2016 10/25/2016 10/24/2016  WBC 4.0 - 10.5 K/uL 10.8(H) 12.2(H) 12.6(H)  Hemoglobin 12.0 - 15.0 g/dL 9.6(L) 11.3(L) 12.0  Hematocrit 36.0 - 46.0 % 30.7(L) 35.0(L) 37.0  Platelets 150 - 400 K/uL 412(H) 700(H) 645(H)   BMP Latest Ref Rng & Units 10/29/2016 10/28/2016 10/27/2016  Glucose 65 - 99 mg/dL 114(H) 168(H) 95  BUN 6 - 20 mg/dL 48(H) 64(H) 77(H)  Creatinine 0.44 - 1.00 mg/dL 2.05(H) 2.20(H) 2.35(H)  BUN/Creat Ratio 12 - 28 - - -  Sodium 135 - 145 mmol/L 142 142 146(H)  Potassium 3.5 - 5.1 mmol/L 3.7 3.7 3.5  Chloride 101 - 111 mmol/L 113(H) 113(H) 113(H)  CO2 22 - 32 mmol/L 22 22 23   Calcium 8.9 - 10.3 mg/dL 8.2(L) 8.1(L) 8.3(L)     Assessment/Plan:  Joy Butler, 65 y. 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 She was found to have multifocal pneumonia on chest x-ray, extubated on November 2,after 10 days of intubation.  Electrolyte abnormalities. Resolved.  Dysphagia and hoarse voice. Improving. She needs adequate caloric intake through dysphagia 1 diet. I will wait for speech  and swallow reevaluation, and then manage her IV fluid accordingly. She is still on maintenance fluid.  Acute on chronic kidney disease. Improving with BUN of 48 and creatinine of 2.05 today. We will continue her maintenance IV fluid, he'll she is able to tolerate by mouth liquids. -Daily BMP  Hypertension.Her blood pressure is still mildly elevated. -Continue home dose of amlodipine 10 mg daily and labetalol when necessary.  Strep pneumonia bacteremia and pneumonia.Marland KitchenResolved on her repeat chest x-ray done on Saturday. She has completed her antibiotic course. She is saturating well on room air. He'll be discharged to [facility], once able to tolerate by mouth liquids. -Continue working with PT and OT.  Diabetes.Last A1c was 6.2 on 07/06/2016.Her CBG stays between 110-165over the last 24 hour. -Continue sliding scale.  History of reoccurring PE.Continue Lovenox.she can be discharged on home dose of xarelto.  Dispo: Anticipated discharge in approximately 1-2 day(s).   Joy Nimrod, MD 10/29/2016, 1:41 PM Pager: [phone]

## 2016-10-30 DIAGNOSIS — R061 Stridor: Secondary | ICD-10-CM

## 2016-10-30 DIAGNOSIS — J383 Other diseases of vocal cords: Secondary | ICD-10-CM

## 2016-10-30 LAB — PROTIME-INR
INR: 1.22
PROTHROMBIN TIME: 15.4 s — AB (ref 11.4–15.2)

## 2016-10-30 LAB — BASIC METABOLIC PANEL
ANION GAP: 8 (ref 5–15)
BUN: 42 mg/dL — ABNORMAL HIGH (ref 6–20)
CALCIUM: 8.2 mg/dL — AB (ref 8.9–10.3)
CO2: 20 mmol/L — AB (ref 22–32)
Chloride: 113 mmol/L — ABNORMAL HIGH (ref 101–111)
Creatinine, Ser: 1.98 mg/dL — ABNORMAL HIGH (ref 0.44–1.00)
GFR calc non Af Amer: 25 mL/min — ABNORMAL LOW (ref >60)
GFR, EST AFRICAN AMERICAN: 29 mL/min — AB (ref >60)
GLUCOSE: 231 mg/dL — AB (ref 65–99)
POTASSIUM: 4.5 mmol/L (ref 3.5–5.1)
Sodium: 141 mmol/L (ref 135–145)

## 2016-10-30 LAB — GLUCOSE, CAPILLARY
GLUCOSE-CAPILLARY: 211 mg/dL — AB (ref 65–99)
Glucose-Capillary: 215 mg/dL — ABNORMAL HIGH (ref 65–99)
Glucose-Capillary: 223 mg/dL — ABNORMAL HIGH (ref 65–99)
Glucose-Capillary: 236 mg/dL — ABNORMAL HIGH (ref 65–99)

## 2016-10-30 LAB — PHOSPHORUS: PHOSPHORUS: 3.9 mg/dL (ref 2.5–4.6)

## 2016-10-30 LAB — MAGNESIUM: Magnesium: 1.8 mg/dL (ref 1.7–2.4)

## 2016-10-30 MED ORDER — DEXTROSE-NACL 5-0.45 % IV SOLN
INTRAVENOUS | Status: AC
  Administered 2016-10-30 (×2): via INTRAVENOUS

## 2016-10-30 MED ORDER — IPRATROPIUM-ALBUTEROL 0.5-2.5 (3) MG/3ML IN SOLN
3.0000 mL | Freq: Four times a day (QID) | RESPIRATORY_TRACT | Status: DC
  Administered 2016-10-30 (×2): 3 mL via RESPIRATORY_TRACT
  Filled 2016-10-30 (×2): qty 3

## 2016-10-30 MED ORDER — HYDROCHLOROTHIAZIDE 12.5 MG PO CAPS
12.5000 mg | ORAL_CAPSULE | Freq: Every day | ORAL | Status: DC
  Administered 2016-10-30: 12.5 mg via ORAL
  Filled 2016-10-30: qty 1

## 2016-10-30 MED ORDER — IPRATROPIUM-ALBUTEROL 0.5-2.5 (3) MG/3ML IN SOLN
3.0000 mL | Freq: Three times a day (TID) | RESPIRATORY_TRACT | Status: DC
  Administered 2016-10-30 – 2016-11-05 (×18): 3 mL via RESPIRATORY_TRACT
  Filled 2016-10-30 (×18): qty 3

## 2016-10-30 MED ORDER — DEXAMETHASONE SODIUM PHOSPHATE 4 MG/ML IJ SOLN
4.0000 mg | Freq: Four times a day (QID) | INTRAMUSCULAR | Status: AC
  Administered 2016-10-30 (×2): 4 mg via INTRAVENOUS
  Filled 2016-10-30 (×2): qty 1

## 2016-10-30 MED ORDER — BUDESONIDE 0.5 MG/2ML IN SUSP
0.5000 mg | Freq: Two times a day (BID) | RESPIRATORY_TRACT | Status: DC
  Administered 2016-10-30 – 2016-11-05 (×14): 0.5 mg via RESPIRATORY_TRACT
  Filled 2016-10-30 (×14): qty 2

## 2016-10-30 NOTE — Progress Notes (Signed)
   Subjective: Patient was feeling slightly better today, asking for water. Still unable to swallow liquids as of speech evaluation yesterday. Last night PC CM was called to evaluate because of stridor, it's  the same since her extubation. Her voice is improving.  Objective:  Vital signs in last 24 hours: Vitals:   10/30/16 0828 10/30/16 0900 10/30/16 1232 10/30/16 1253  BP:  114/87    Pulse:  100    Resp:  17    Temp:  97.5 F (36.4 C)  97 F (36.1 C)  TempSrc:  Oral  Axillary  SpO2: 94% 96% 100%   Weight:      Height:       Gen. Fatigued looking lady, in no acute distress. Lungs. Mostly upper respiratory coarse transmitted sounds. Faint bibasilar crackles. CV. Regular rate and rhythm Abdomen. Soft, nontender, nondistended, bowel sounds positive Extremities. No edema, no cyanosis, pulses 2+ bilaterally.  Labs. BMP Latest Ref Rng & Units 10/30/2016 10/29/2016 10/28/2016  Glucose 65 - 99 mg/dL 231(H) 114(H) 168(H)  BUN 6 - 20 mg/dL 42(H) 48(H) 64(H)  Creatinine 0.44 - 1.00 mg/dL 1.98(H) 2.05(H) 2.20(H)  BUN/Creat Ratio 12 - 28 - - -  Sodium 135 - 145 mmol/L 141 142 142  Potassium 3.5 - 5.1 mmol/L 4.5 3.7 3.7  Chloride 101 - 111 mmol/L 113(H) 113(H) 113(H)  CO2 22 - 32 mmol/L 20(L) 22 22  Calcium 8.9 - 10.3 mg/dL 8.2(L) 8.2(L) 8.1(L)   PO4. 3.9 Mag. 1.8  Assessment/Plan:  Ms. Maleki, 65 y. 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 She was found to have multifocal pneumonia on chest x-ray, extubated on November 2,after 10 days of intubation.  Dysphagia and hoarse voice. Improving. Still unable to swallow liquids without aspiration, as of speech evaluation yesterday. -Continue dysphagia 1 diet. -IV maintenance fluids at a slower rate of 50 mL per hour. -Transferred to [place].  Acute on chronic kidney disease. Improving with BUN of 42 and creatinine of 1.98 today. -Daily  BMP.  Strep pneumonia bacteremia and pneumonia.Marland KitchenResolved on her repeat chest x-ray done on Saturday. She remains afebrile. She'll be discharged to [facility], once able to tolerate by mouth liquids. -Continue working with PT and OT.  Diabetes.Last A1c was 6.2 on 07/06/2016.Her CBG stays between 211-223over the last 24 hour. More than her usual reading. -Continue sliding scale.  Hypertension. Improving, last reading was 114/87. -Continue home dose of amlodipine 10 mg daily and labetalol when necessary.  Dispo: Anticipated discharge in approximately 1-2 day(s).   Lorella Nimrod, MD 10/30/2016, 1:40 PM Pager: [phone]

## 2016-10-30 NOTE — Progress Notes (Addendum)
CSWs still attempting to submit snf referral to [facility], but authorization takes several days to be reviewed. CSW to look at other options if patient is discharged over the weekend when facilities are closed. Patient's husband states she can come home if necessary.   Percell Locus Benen Weida LCSWA [phone]

## 2016-10-30 NOTE — Progress Notes (Signed)
Speech Language Pathology Treatment: Dysphagia  Patient Details Name: Joy Butler MRN: [id number] DOB: December 18, 1951 Today's Date: 10/30/2016 Time: 1500-1520 SLP Time Calculation (min) (ACUTE ONLY): 20 min  Assessment / Plan / Recommendation Clinical Impression  Skilled treatment focused on dysphagia goals. SLP facilitated session by providing trials of honey thick liquids via spoon and cup sips. Pt with timely swallow initiation with both presentations. Pt's voice remained clear without any overt s/s of aspiration. Pt with inhalation stridor at rest that was not changed with consumption of honey thick liquids. Pt didn't demonstrate any change in respiratory status. ST recommend upgrading to honey thick liquids with direct nursing supervision.  Education provided to pt on process of upgrading liquids and thin liquids was not indicated at this time (pt requesting thin ice water). ST to continue following during stay for possibility of diet advancement.    HPI HPI: [name] with PMH of PE (non-compliant with xarelto), HTN, CKD, HLD admitted 10/22 per IMTS with multifocal PNA. Developed worsening respiratory failure requiring intubation 10/22-10/29 with reintubation 10/29-11/2.  MBS 11/6 revealed moderate dysphagia with gross penetration with liquids nectar or thinner.  Pt did not aspirate honey thick liquids on multiple trials. ENT eval revealed bilateral ulcers on vocal folds secondary to intubations - their presence explains dysphagia.       SLP Plan  Continue with current plan of care     Recommendations  Diet recommendations: Dysphagia 1 (puree);Honey-thick liquid Liquids provided via: Cup Medication Administration: Crushed with puree Supervision: Full supervision/cueing for compensatory strategies Compensations: Slow rate;Minimize environmental distractions;Small sips/bites Postural Changes and/or Swallow Maneuvers: Seated upright 90 degrees                Oral Care  Recommendations: Oral care BID Follow up Recommendations:  (TBA) Plan: Continue with current plan of care       GO           Joy Butler B. Rutherford Nail, M.S., CCC-SLP Speech-Language Pathologist [phone]   Joy Butler Rutherford Nail 10/30/2016, 3:39 PM

## 2016-10-30 NOTE — Progress Notes (Signed)
ANTICOAGULATION CONSULT NOTE - Follow Up Consult  Pharmacy Consult for Lovenox Indication: History of pulmonary embolus  Allergies  Allergen Reactions  . Keflex [Cephalexin] Itching and Swelling    Lips and eyes swelled up  . Clindamycin/Lincomycin Itching  . Latex Itching  . Sulfa Antibiotics Rash    Patient Measurements: Height: 5\' 9"  (175.3 cm) Weight: 195 lb 8.8 oz (88.7 kg) IBW/kg (Calculated) : 66.2 Heparin Dosing Weight: 86 kg  Vital Signs: Temp: 97.7 F (36.5 C) (11/10 0407) Temp Source: Oral (11/10 0407) BP: 159/93 (11/10 0500) Pulse Rate: 96 (11/10 0500)  Labs:  Recent Labs  10/28/16 0405 10/29/16 0357 10/30/16 0525  HGB  --  9.6*  --   HCT  --  30.7*  --   PLT  --  412*  --   LABPROT 16.6* 14.8 15.4*  INR 1.33 1.16 1.22  CREATININE 2.20* 2.05* 1.98*    Estimated Creatinine Clearance: 32.7 mL/min (by C-G formula based on SCr of 1.98 mg/dL (H)).  Assessment: 65 yo female w/ history of PE on Xarelto PTA. Stopped potentially due to poor compliance. Initially placed on warfarin (with bridge) at admission. Transitioned to heparin and off warfarin due to potential trach procedure. Heparin gtt off on 11/3 and transitioned to treatment dose lovenox. Plan is to discharge back on home dose of Xarelto when stable. Hgb low but stable at 9.6, plts 412.  Goal of Therapy:  Anti-Xa level 0.6-1 units/ml 4hrs after LMWH dose given Monitor platelets by anticoagulation protocol: Yes   Plan:  Continue Lovenox 90 mg sq Q 24 for hx of PE  Monitor CBC, s/s of bleed F/U restart of Xarelto at discharge  Elenor Quinones, PharmD, [place] Pharmacist Pager [phone] 10/30/2016 8:13 AM

## 2016-10-30 NOTE — Progress Notes (Signed)
  Date: 10/30/2016  Patient name: Joy Butler  Medical record number: [id number]  Date of birth: 23-Oct-1951   This patient's plan of care was discussed with the house staff. Please see their note for complete details. I concur with their findings. Pt c/o thirst this am.  Was seen by CCM o/n for worsening dyspnea.   Blood pressure 114/87, pulse 100, temperature 97.5 F (36.4 C), temperature source Oral, resp. rate 17, height 5\' 9"  (1.753 m), weight 88.7 kg (195 lb 8.8 oz), SpO2 96 %. cv- rrr Chest- upper airway noise abd- bs+, soft, nontedner  Lab-  Cr 1.98 Na 141  A/P Stridor vocal cord ulcers Steroids increased overnight Her phonation continues to improve.  Will continue to follow with nutrition.  Try to minimize IVF  Campbell Riches, MD 10/30/2016, 9:30 AM

## 2016-10-31 ENCOUNTER — Inpatient Hospital Stay (HOSPITAL_COMMUNITY): Payer: Medicare Other

## 2016-10-31 LAB — GLUCOSE, CAPILLARY
GLUCOSE-CAPILLARY: 123 mg/dL — AB (ref 65–99)
GLUCOSE-CAPILLARY: 129 mg/dL — AB (ref 65–99)
GLUCOSE-CAPILLARY: 120 mg/dL — AB (ref 65–99)
Glucose-Capillary: 129 mg/dL — ABNORMAL HIGH (ref 65–99)

## 2016-10-31 LAB — CULTURE, BLOOD (ROUTINE X 2): Culture: NO GROWTH

## 2016-10-31 LAB — PROTIME-INR
INR: 1.06
PROTHROMBIN TIME: 13.8 s (ref 11.4–15.2)

## 2016-10-31 MED ORDER — INSULIN GLARGINE 100 UNIT/ML ~~LOC~~ SOLN
5.0000 [IU] | Freq: Every day | SUBCUTANEOUS | Status: DC
  Administered 2016-10-31 – 2016-11-04 (×5): 5 [IU] via SUBCUTANEOUS
  Filled 2016-10-31 (×6): qty 0.05

## 2016-10-31 MED ORDER — SODIUM CHLORIDE 0.9% FLUSH: 10.0000 mL | INTRAVENOUS | Status: DC | PRN

## 2016-10-31 MED ORDER — STARCH (THICKENING) PO POWD
ORAL | Status: DC | PRN
  Filled 2016-10-31: qty 227

## 2016-10-31 MED ORDER — HYDROCHLOROTHIAZIDE 25 MG PO TABS
25.0000 mg | ORAL_TABLET | Freq: Every day | ORAL | Status: DC
  Administered 2016-10-31 – 2016-11-05 (×6): 25 mg via ORAL
  Filled 2016-10-31 (×6): qty 1

## 2016-10-31 NOTE — Progress Notes (Signed)
  Date: 10/31/2016  Patient name: Joy Butler  Medical record number: [id number]  Date of birth: [birth year]   This patient's plan of care was discussed with the house staff. Please see their note for complete details. I concur with their findings. Swallowing better.  Cough improving.  Awakens easily, without complaints.  Blood pressure (!) 160/69, pulse 85, temperature 97.7 F (36.5 C), temperature source Oral, resp. rate 20, height 5\' 9"  (1.753 m), weight 90.6 kg (199 lb 11.8 oz), SpO2 100 %. CV- RRR Chest- mild wheeze Abd- BS+, soft, nontender.   A/P Dysphagia, cough, vocal cord ulcers.  She is improving Appreciate continued speech f/u.   HTN Will work towards better control.   CRI Cr has been improving.   Hopefully to SNF soon.   Campbell Riches, MD 10/31/2016, 2:41 PM

## 2016-10-31 NOTE — Progress Notes (Signed)
Patient transferred from [facility] at 8:30PM. Vital signs obtained after arrival and located below. Telemetry box 19 placed and verified by [employer]. Skin assessment completed and second verified by Sharrell Ku. Will continue to monitor and treat per MD orders.    10/30/16 2046  Vitals  Temp 97.7 F (36.5 C)  Temp Source Oral  BP (!) 146/78  MAP (mmHg) 94  BP Location Left Arm  BP Method Automatic  Patient Position (if appropriate) Lying  Pulse Rate 100  Resp 18  Oxygen Therapy  SpO2 100 %  O2 Device Room Air

## 2016-10-31 NOTE — Progress Notes (Signed)
   Subjective: Patient was feeling better, asking for orange juice. She was able to pass swallow studies for honey thick liquids yesterday, small improvement. She states that her cough is improving. She was able to sleep last night.  Objective:  Vital signs in last 24 hours: Vitals:   10/31/16 0500 10/31/16 0527 10/31/16 0529 10/31/16 0750  BP:  (!) 170/85 (!) 153/82   Pulse:  87 85   Resp:  18    Temp:  97.5 F (36.4 C)    TempSrc:  Oral    SpO2:  95%  94%  Weight: 199 lb 11.8 oz (90.6 kg)     Height:       Gen. well built, mildly fatigued looking lady, lying comfortably in bed, alert and oriented ,in no acute distress. Lungs.Mostly upper respiratory coarse transmitted sounds. CV. Regular rate and rhythm, no rubs/murmurs/gallops Abdomen.Soft, nontender, nondistended, bowel sounds positive Extremities.No edema, no cyanosis, pulses 2+ bilaterally.  Assessment/Plan:  Joy Butler, 65 y. 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 She was found to have multifocal pneumonia on chest x-ray, extubated on November 2,after 10 days of intubation.  Dysphagia and hoarse voice. Continued to improve. She was able to swallow honey thick liquid yesterday without any aspiration. Her voice and cough are improving. -Continue swallow evaluation, we appreciate their recommendations. -Continue dysphagia 1 diet with honey thick liquid. -We will stop IV fluids once start taking liquids.  Acute on chronic kidney disease. Improving. -Daily BMP.  Strep pneumonia bacteremia and pneumonia.Marland KitchenResolved on her repeat chest x-ray done on Saturday. She remains afebrile. She'll be discharged to SNF,once able to tolerate by mouth liquids. -Continue working with PT and OT.  Diabetes. Well controlled. Her CBG today morning was 129. Will add lantus 5 units today. -Continue SSI.  Hypertension. Her blood pressure  was elevated this morning. -Increase hydrochlorothiazide to 25 mg daily. -Continue amlodipine 10 mg daily.  Dispo: Anticipated discharge in approximately 1-2 day(s)., Pending improvement in her dysphagia and SNF bed availability.  Lorella Nimrod, MD 10/31/2016, 8:43 AM Pager: [phone]

## 2016-11-01 LAB — PROTIME-INR
INR: 1.15
Prothrombin Time: 14.7 seconds (ref 11.4–15.2)

## 2016-11-01 LAB — GLUCOSE, CAPILLARY
GLUCOSE-CAPILLARY: 99 mg/dL (ref 65–99)
GLUCOSE-CAPILLARY: 123 mg/dL — AB (ref 65–99)
Glucose-Capillary: 102 mg/dL — ABNORMAL HIGH (ref 65–99)
Glucose-Capillary: 103 mg/dL — ABNORMAL HIGH (ref 65–99)

## 2016-11-01 MED ORDER — MENTHOL 3 MG MT LOZG: 1.0000 | LOZENGE | OROMUCOSAL | Status: DC | PRN

## 2016-11-01 MED ORDER — HYDROCORTISONE ACETATE 25 MG RE SUPP
25.0000 mg | Freq: Two times a day (BID) | RECTAL | Status: DC
  Administered 2016-11-02 – 2016-11-05 (×7): 25 mg via RECTAL
  Filled 2016-11-01 (×8): qty 1

## 2016-11-01 MED ORDER — DOCUSATE SODIUM 100 MG PO CAPS
100.0000 mg | ORAL_CAPSULE | Freq: Once | ORAL | Status: AC
  Administered 2016-11-02: 100 mg via ORAL
  Filled 2016-11-01: qty 1

## 2016-11-01 MED ORDER — LOSARTAN POTASSIUM 50 MG PO TABS
25.0000 mg | ORAL_TABLET | Freq: Every day | ORAL | Status: DC
  Administered 2016-11-01 – 2016-11-02 (×2): 25 mg via ORAL
  Administered 2016-11-03: 50 mg via ORAL
  Administered 2016-11-04: 25 mg via ORAL
  Filled 2016-11-01 (×4): qty 1

## 2016-11-01 MED ORDER — GI COCKTAIL ~~LOC~~
30.0000 mL | Freq: Once | ORAL | Status: AC
  Administered 2016-11-02: 30 mL via ORAL
  Filled 2016-11-01: qty 30

## 2016-11-01 NOTE — Progress Notes (Signed)
  Date: 11/01/2016  Patient name: Joy Butler  Medical record number: [id number]  Date of birth: [birth year]   This patient's plan of care was discussed with the house staff. Please see their note for complete details. I concur with their findings. Feels like her swallowing is better and her voice is also better.  Blood pressure (!) 172/83, pulse 95, temperature 97.7 F (36.5 C), temperature source Oral, resp. rate (!) 22, height 5\' 9"  (1.753 m), weight 90.5 kg (199 lb 8.3 oz), SpO2 98 %. cv- rrr Chest- cta abd- bs+, soft, nontender.   A/p Vocal cord ulcers, stridor Dysphagia She is doing better Will get her central line out.  Appreciate nutrition f/u.  Hopefully to SNF soon.   Campbell Riches, MD 11/01/2016, 10:04 AM

## 2016-11-01 NOTE — Progress Notes (Signed)
   Subjective: Patient was feeling better. Her voice and cough is improving. She was able to tolerate honey thick liquids.  Objective:  Vital signs in last 24 hours: Vitals:   10/31/16 2209 10/31/16 2300 11/01/16 0554 11/01/16 0745  BP: (!) 163/81  (!) 172/83   Pulse: 95  95   Resp: 20  (!) 22   Temp: 97.5 F (36.4 C)  97.7 F (36.5 C)   TempSrc: Oral  Oral   SpO2: 95%  98% 98%  Weight:  199 lb 8.3 oz (90.5 kg)    Height:       Gen. well-built, well-nourished, in no acute distress. Lungs. Mostly clear except a few upper respiratory transmitted sounds. CV. Regular rate and rhythm. Abdomen. Soft, nontender, nondistended, bowel sounds positive. Extremities.No edema, no cyanosis, pulses 2+ bilaterally.  Assessment/Plan:  Ms. Oldenkamp, 65 y. 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 She was found to have multifocal pneumonia on chest x-ray, extubated on November 2,after 10 days of intubation.  Dysphagia and hoarse voice. Continued to improve. She was able to swallow honey thick liquid. We will remove central catheter today. Encourage more by mouth intake. Hopefully she can be discharged to SNF in 1-2 days.  Acute on chronic kidney disease.Improving. -Daily BMP.  Strep pneumonia bacteremia and pneumonia.Marland KitchenResolved on her repeat chest x-ray done on Saturday.She remains afebrile. She'll be discharged to SNF to get some physical therapy for her deconditioning.  Diabetes. Well controlled. Her CBG today morning was 102. Continue Lantus 5 units at bedtime and sliding scale.  Hypertension. Remains elevated. Start her on her home dose of losartan 25 mg daily. -BMP tomorrow.  Dispo: Anticipated discharge in approximately 1-2 day(s).   Lorella Nimrod, MD 11/01/2016, 12:30 PM Pager: [phone]

## 2016-11-01 NOTE — Progress Notes (Signed)
I was paged by pts nurse at approximately 11:15 pm stating patient is having bleeding from her perineal area. Dr. Juleen China and I went to see the patient shortly after. Nurse notes that patient has been straining to defecate recently and is currently on the bedpan attempting to defecate. Patient reports no complaints except lower abdominal pain which started earlier today. Pt reports onset of bleeding after straining to defecate. Denies any SOB, N/V, diarrhea, fevers, chills, weakness or any other symptoms at this time.  General: Alert, african american female resting in bed, on bedpan. In moderate distress, saying "my stomach hurts, please help me." Minimal blood on bed sheet.  Cardiovascular: RRR, no murmur Pulmonary: CTA BL, no wheezing Abdomen: Soft, non-tender and not distended. No guarding or rigidity. Pain not elicited at all by palpation. +bowel sounds Rectal: External hemorrhoids.  Assessment/Plan: This is a [name] with MHx of PE on chronic a/c, HLD, CKD, Depression, T2DM and multifocal pneumonia s/p abx treatment. Paged by RN that patient was having blood in bed pan and on sheet. On examination the patient was straining to defecate and she reports blood started today after she initially tried to have a BM. Denied any hx of hemorrhoids but external hemorrhoids visible on external exam. Pt also complains of constipation and lower abdominal pain. She denied any other complaints. At this time, suspect patients bleeding secondary to straining with defecation with hemorrhoids, exacerbated by anticoagulation. There was a minimal amount of blood on sheet. Patient hemodynamically stable and appears non-toxic. Will order CBC for AM labs, most recent Hb 9.6 11/9 as well as GI cocktail and Anusol suppository BID. Pt already has Colace and Miralax prn and are encouraged to give to reduce straining.

## 2016-11-02 ENCOUNTER — Inpatient Hospital Stay (HOSPITAL_COMMUNITY): Payer: Medicare Other

## 2016-11-02 DIAGNOSIS — K5900 Constipation, unspecified: Secondary | ICD-10-CM

## 2016-11-02 DIAGNOSIS — K649 Unspecified hemorrhoids: Secondary | ICD-10-CM

## 2016-11-02 DIAGNOSIS — E860 Dehydration: Secondary | ICD-10-CM

## 2016-11-02 LAB — CBC
HCT: 30.9 % — ABNORMAL LOW (ref 36.0–46.0)
HEMOGLOBIN: 9.7 g/dL — AB (ref 12.0–15.0)
MCH: 26.9 pg (ref 26.0–34.0)
MCHC: 31.4 g/dL (ref 30.0–36.0)
MCV: 85.8 fL (ref 78.0–100.0)
PLATELETS: 376 10*3/uL (ref 150–400)
RBC: 3.6 MIL/uL — ABNORMAL LOW (ref 3.87–5.11)
RDW: 18 % — ABNORMAL HIGH (ref 11.5–15.5)
WBC: 9.7 10*3/uL (ref 4.0–10.5)

## 2016-11-02 LAB — GLUCOSE, CAPILLARY
GLUCOSE-CAPILLARY: 253 mg/dL — AB (ref 65–99)
GLUCOSE-CAPILLARY: 100 mg/dL — AB (ref 65–99)
GLUCOSE-CAPILLARY: 149 mg/dL — AB (ref 65–99)
GLUCOSE-CAPILLARY: 140 mg/dL — AB (ref 65–99)
Glucose-Capillary: 136 mg/dL — ABNORMAL HIGH (ref 65–99)
Glucose-Capillary: 106 mg/dL — ABNORMAL HIGH (ref 65–99)

## 2016-11-02 LAB — CBC WITH DIFFERENTIAL/PLATELET
BASOS PCT: 0 %
Basophils Absolute: 0 10*3/uL (ref 0.0–0.1)
EOS ABS: 0.3 10*3/uL (ref 0.0–0.7)
EOS PCT: 3 %
HCT: 32.5 % — ABNORMAL LOW (ref 36.0–46.0)
HEMOGLOBIN: 10.3 g/dL — AB (ref 12.0–15.0)
LYMPHS ABS: 2.1 10*3/uL (ref 0.7–4.0)
Lymphocytes Relative: 21 %
MCH: 27.2 pg (ref 26.0–34.0)
MCHC: 31.7 g/dL (ref 30.0–36.0)
MCV: 85.8 fL (ref 78.0–100.0)
MONO ABS: 1 10*3/uL (ref 0.1–1.0)
MONOS PCT: 10 %
Neutro Abs: 6.8 10*3/uL (ref 1.7–7.7)
Neutrophils Relative %: 66 %
PLATELETS: 358 10*3/uL (ref 150–400)
RBC: 3.79 MIL/uL — ABNORMAL LOW (ref 3.87–5.11)
RDW: 18.3 % — AB (ref 11.5–15.5)
WBC: 10.1 10*3/uL (ref 4.0–10.5)

## 2016-11-02 LAB — BASIC METABOLIC PANEL
ANION GAP: 6 (ref 5–15)
BUN: 30 mg/dL — ABNORMAL HIGH (ref 6–20)
CALCIUM: 8.2 mg/dL — AB (ref 8.9–10.3)
CO2: 22 mmol/L (ref 22–32)
CREATININE: 1.9 mg/dL — AB (ref 0.44–1.00)
Chloride: 109 mmol/L (ref 101–111)
GFR, EST AFRICAN AMERICAN: 30 mL/min — AB (ref >60)
GFR, EST NON AFRICAN AMERICAN: 26 mL/min — AB (ref >60)
GLUCOSE: 110 mg/dL — AB (ref 65–99)
Potassium: 4.6 mmol/L (ref 3.5–5.1)
Sodium: 137 mmol/L (ref 135–145)

## 2016-11-02 LAB — URINALYSIS, ROUTINE W REFLEX MICROSCOPIC
GLUCOSE, UA: 100 mg/dL — AB
KETONES UR: 15 mg/dL — AB
Nitrite: POSITIVE — AB
PH: 5.5 (ref 5.0–8.0)
Protein, ur: 100 mg/dL — AB
SPECIFIC GRAVITY, URINE: 1.014 (ref 1.005–1.030)

## 2016-11-02 LAB — URINE MICROSCOPIC-ADD ON: BACTERIA UA: NONE SEEN

## 2016-11-02 MED ORDER — ENSURE ENLIVE PO LIQD
237.0000 mL | Freq: Two times a day (BID) | ORAL | Status: DC
  Administered 2016-11-03 – 2016-11-05 (×3): 237 mL via ORAL

## 2016-11-02 MED ORDER — HYDROCODONE-ACETAMINOPHEN 5-325 MG PO TABS
1.0000 | ORAL_TABLET | ORAL | Status: DC | PRN
  Administered 2016-11-02 – 2016-11-05 (×8): 1 via ORAL
  Filled 2016-11-02 (×8): qty 1

## 2016-11-02 MED ORDER — POLYETHYLENE GLYCOL 3350 17 G PO PACK
17.0000 g | PACK | Freq: Every day | ORAL | Status: DC
  Administered 2016-11-02 – 2016-11-03 (×2): 17 g
  Filled 2016-11-02 (×4): qty 1

## 2016-11-02 MED ORDER — DOCUSATE SODIUM 50 MG/5ML PO LIQD
100.0000 mg | Freq: Two times a day (BID) | ORAL | Status: DC
  Administered 2016-11-04 – 2016-11-05 (×3): 100 mg
  Filled 2016-11-02 (×5): qty 10

## 2016-11-02 MED ORDER — GLYCERIN (LAXATIVE) 2.1 G RE SUPP
1.0000 | Freq: Every day | RECTAL | Status: DC | PRN
  Filled 2016-11-02: qty 1

## 2016-11-02 NOTE — Care Management Important Message (Signed)
Important Message  Patient Details  Name: Joy Butler MRN: [id number] Date of Birth: 1951/07/19   Medicare Important Message Given:  Yes    [name] May 11/02/2016, 10:41 AM

## 2016-11-02 NOTE — Progress Notes (Signed)
  Date: 11/02/2016  Patient name: Joy Butler  Medical record number: [id number]  Date of birth: 13-Jun-1951   This patient's plan of care was discussed with the house staff. Please see their note for complete details. I concur with their findings. Without complaints this AM, no dyspnea or sob. Phonating well.  Blood pressure (!) 168/83, pulse 82, temperature 97.3 F (36.3 C), temperature source Oral, resp. rate 20, height 5\' 9"  (1.753 m), weight 91 kg (200 lb 9.9 oz), SpO2 99 %. CV- RRR Chest- improved breath sounds Abd- bs+, soft, nontender.   Lab Cr 1.90 <-- 1.98  A/p Vocal cord ulcers She is improving.  PT note reviewed.  long planning for SNF/CIR  BRBPR She has hemmerroids. Has has been dehydrated.  H/h stable Will watch.   Campbell Riches, MD 11/02/2016, 2:20 PM

## 2016-11-02 NOTE — Progress Notes (Signed)
Speech Language Pathology Treatment: Dysphagia  Patient Details Name: Joy Butler MRN: [id number] DOB: [birth year] Today's Date: 11/02/2016 Time: [id number] SLP Time Calculation (min) (ACUTE ONLY): 18 min  Assessment / Plan / Recommendation Clinical Impression  Pt demonstrates ongoing signs of dysphagia including breathy vocal quality, though possibly improved since last week. Pt also with effortful respirations, impairing swallowing and breathing coordination. If pt holds breath more than 2 seconds she requires a deep reflexive inhalation after swallow. Suspect high risk of de[rease apneic period for swallow. Pt trialed with nectar thick liquids with no signs of aspiration in contrast with honey, though given high risk for silent aspiration, this is not evidence for upgrade. Pt will need repeat MBS, will plan for tomorrow. Ok to upgrade to dys 2 diet as pt is able to masticate soft solids without dentition.    HPI HPI: [name] with PMH of PE (non-compliant with xarelto), HTN, CKD, HLD admitted 10/22 per IMTS with multifocal PNA. Developed worsening respiratory failure requiring intubation 10/22-10/29 with reintubation 10/29-11/2.  MBS 11/6 revealed moderate dysphagia with gross penetration with liquids nectar or thinner.  Pt did not aspirate honey thick liquids on multiple trials. ENT eval revealed bilateral ulcers on vocal folds secondary to intubations - their presence explains dysphagia.       SLP Plan  Continue with current plan of care     Recommendations  Diet recommendations: Dysphagia 2 (fine chop);Honey-thick liquid Liquids provided via: Cup Medication Administration: Whole meds with puree Supervision: Intermittent supervision to cue for compensatory strategies Compensations: Slow rate;Minimize environmental distractions;Small sips/bites Postural Changes and/or Swallow Maneuvers: Seated upright 90 degrees                General recommendations: Rehab consult Oral Care  Recommendations: Oral care BID Plan: Continue with current plan of care       [place] Tinnie Kunin, MA CCC-SLP [id number]  Lynann Beaver 11/02/2016, 10:21 AM

## 2016-11-02 NOTE — Progress Notes (Signed)
Patient received insurance approval ([id number] 7 days) for snf stay. Patient can discharge Tuesday to [place].  Percell Locus Charlei Ramsaran LCSWA [phone]

## 2016-11-02 NOTE — Progress Notes (Signed)
CSW initiated insurance auth but it will take 72 hours to get decision. Patient is able to discharge with a 5 day log to [facility], but patient's husband says he cannot get there. He only wants patient at [facility]. He states he will take patient home over sending her to [facility]. If patient discharges home, she still has a week to get to [place] once [employer] approves her stay.   Percell Locus Khylei Wilms LCSWA [phone]

## 2016-11-02 NOTE — Progress Notes (Signed)
CSW received confirmation that [employer] has received request for snf stay and will fax an approval or denial after 72 hours.   Percell Locus Zea Kostka LCSWA [phone]

## 2016-11-02 NOTE — Progress Notes (Signed)
Nutrition Follow-up  DOCUMENTATION CODES:   Obesity unspecified  INTERVENTION:   - Provide Magic Cup oral nutrition supplement TID with all meals. Each provides 290 kcal and 9 grams protein. - Provide Ensure Enlive oral nutrition supplement BID between meals. Each provides 350 kcal and 20 grams protein. - Encourage PO intake.  NUTRITION DIAGNOSIS:   Predicted suboptimal nutrient intake related to dysphagia, lethargy/confusion as evidenced by  (MBSS results/impression).  Ongoing.  GOAL:   Patient will meet greater than or equal to 90% of their needs  Unmet.  MONITOR:   PO intake, Supplement acceptance, Labs, Weight trends, I & O's  ASSESSMENT:   [name], occasional cigarette smoking, with PMH of arthritis, cataracts, chronic back pain, migraines, depression, HLD, HTN, splenic infarct, DM II, PE (2011, on xarelto with intermittent compliance) who presented to [facility] on 10/22 with reports of lower extremity pain and shortness of breath. Admitted with multi-focal PNA / acute respiratory failure.  Per chart, pt consuming 5-50% of meals. Spoke with family member at bedside who reports pt has been eating "very little" since she has advanced to PO intake. Family member states pt ate well PTA and had a stable weight PTA. Family member amenable to pt receiving Magic Cup with meals and Ensure Enlive BID. Will order.  Medications reviewed and include 100 mg Colace BID, 90 mg Lovenox daily, 20 mg Pepcid daily, sliding scale Novolog, 17 g Miralax daily, 40 mg Pravachol daily, PRN Zofran  Labs reviewed and include elevated BUN (30 mg/dL), elevated creatinine (1.90 mg/dL), low calcium (8.2 mg/dL) CBG's: 99-136 mg/dL  Diet Order:  DIET DYS 2 Room service appropriate? Yes; Fluid consistency: Honey Thick  Skin:  Wound (see comment) (st 2 pressure injury coccyx, diabetic ulcer left foot)  Last BM:  11/02/16  Height:   Ht Readings from Last 1 Encounters:  10/18/16 5\' 9"  (1.753 m)     Weight:   Wt Readings from Last 1 Encounters:  11/02/16 200 lb 9.9 oz (91 kg)    Ideal Body Weight:  65.9 kg  BMI:  Body mass index is 29.63 kg/m.  Estimated Nutritional Needs:   Kcal:  1800-2000  Protein:  90-100 gm  Fluid:  1.8-2.0 L  EDUCATION NEEDS:   No education needs identified at this time  Jeb Levering Dietetic Intern Pager Number: [phone]'

## 2016-11-02 NOTE — Progress Notes (Signed)
ANTICOAGULATION CONSULT NOTE - Follow Up Consult  Pharmacy Consult for Lovenox Indication: History of pulmonary embolus  Allergies  Allergen Reactions  . Keflex [Cephalexin] Itching and Swelling    Lips and eyes swelled up  . Clindamycin/Lincomycin Itching  . Latex Itching  . Sulfa Antibiotics Rash    Patient Measurements: Height: 5\' 9"  (175.3 cm) Weight: 200 lb 9.9 oz (91 kg) IBW/kg (Calculated) : 66.2 Heparin Dosing Weight: 86 kg  Vital Signs: Temp: 97.3 F (36.3 C) (11/13 0838) Temp Source: Oral (11/13 0838) BP: 168/83 (11/13 0838) Pulse Rate: 82 (11/13 0838)  Labs:  Recent Labs  10/31/16 0520 11/01/16 0500 11/02/16 0751  HGB  --   --  9.7*  HCT  --   --  30.9*  PLT  --   --  376  LABPROT 13.8 14.7  --   INR 1.06 1.15  --   CREATININE  --   --  1.90*    Estimated Creatinine Clearance: 34.5 mL/min (by C-G formula based on SCr of 1.9 mg/dL (H)).  Assessment: 65 yo female w/ history of PE on Xarelto PTA - stopped potentially due to poor compliance. Initially placed on warfarin (with bridge) at admission. Transitioned to heparin and off warfarin for poss trach procedure. Heparin gtt >> treatment dose lovenox 11/3.   Per MD, small amt of blood on sheet yesterday following defecation - believes to be secondary to straining with hemorrhoids/anti-coag. Hgb stable at 9.7, plt 376.    Goal of Therapy:  Anti-Xa level 0.6-1 units/ml 4hrs after LMWH dose given Monitor platelets by anticoagulation protocol: Yes   Plan:  -Continue Lovenox 90 mg sq Q 24 for hx of PE - may need adjustment pending further improvement in renal fxn  -Monitor CBC, s/s of bleed -F/U restart of Xarelto   Stephens November, PharmD Clinical Pharmacist 9:30 AM, 11/02/2016

## 2016-11-02 NOTE — Progress Notes (Signed)
   Subjective: Patient was complaining lower abdominal pain, 8-9/10 in intensity, non radiating. Denies any dysuria or burning micturition. Also complains of constipation. Nursing staff was concerned that they noticed some blood in her sheets, last night when she was straining for bowel movement. Her hoarseness of voice are improving.She was able to maintain her by mouth intake.  Objective:  Vital signs in last 24 hours: Vitals:   11/02/16 0543 11/02/16 0838 11/02/16 0859 11/02/16 0902  BP: (!) 152/67 (!) 168/83    Pulse: 76 82    Resp: 18 20    Temp: 97.6 F (36.4 C) 97.3 F (36.3 C)    TempSrc: Oral Oral    SpO2: 99% 97% 99% 99%  Weight:      Height:       Gen. well-built, well-nourished, in no acute distress. Lungs. Mostly clear except a few upper respiratory transmitted sounds. CV. Regular rate and rhythm. Abdomen. Soft, nontender, nondistended, bowel sounds positive. Extremities.No edema, no cyanosis, pulses 2+ bilaterally. Genital. No active bleeding per rectum.  Hematuria seen while doing a rectal exam. No external urethral injury noted.  Assessment/Plan:  Joy Butler, 65 y. 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 She was found to have multifocal pneumonia on chest x-ray, extubated on November 2,after 10 days of intubation.   Hematuria. Patient has a risk for UTI because of indwelling catheter for long time. Although she don't have any urinary complaints, she is having lower abdominal pain. Hematuria was noted during genital exam. Because of the nursing concern of seeing some blood in her sheats. -UA and culture -Renal ultrasound -Norco when necessary for pain. -CBC  Constipation. Patient is straining during bowel movements. She had MiraLAX and Colace as needed.Was not given in the last 5 days. Her constipation might be contributory to her lower abdominal pain. -MiraLAX  and Colace on a scheduled basis.  Dysphagia and hoarse voice.Continued to improve. She was able to swallow nectar today without any aspiration. We will encourage more by mouth intake. Discharge to SNF, after taking care of her current complaint of hematuria.  Acute on chronic kidney disease.Improving. Her creatinine today was 1.90  Strep pneumonia bacteremia and pneumonia.Marland KitchenResolved on her repeat chest x-ray done on Saturday.She remains afebrile. She'll be discharged to SNF to get some physical therapy for her deconditioning.  Diabetes. Well controlled.Her CBG today morning was 100-136. Continue Lantus 5 units at bedtime and sliding scale.  Hypertension. Still elevated, pain can be a contributory factor. Continue her current regimen of amlodipine 10 mg daily, hydrochlorothiazide 25 mg daily and losartan 25 mg daily. Reevaluate once pain subsides.  Dispo: Anticipated discharge in approximately 1 day(s).   Joy Nimrod, MD 11/02/2016, 2:24 PM Pager: [phone]

## 2016-11-02 NOTE — Progress Notes (Signed)
PT Cancellation Note  Patient Details Name: Joy Butler MRN: [id number] DOB: Nov 17, 1951   Cancelled Treatment:    Reason Eval/Treat Not Completed: Patient not medically ready. PT received pt in lethargy with c/o abdominal pain of 9/10 and labored breathing. BP 160/80, SpO2 95% on RA. RN came to assess patient. PT to hold due to pt very uncomfortable and not feeling well. PT to return as able.   Kingsley Callander 11/02/2016, 1:52 PM  Kittie Plater, PT, DPT Pager #: [phone] Office #: [phone]

## 2016-11-03 ENCOUNTER — Inpatient Hospital Stay (HOSPITAL_COMMUNITY): Payer: Medicare Other

## 2016-11-03 LAB — BASIC METABOLIC PANEL
Anion gap: 8 (ref 5–15)
BUN: 29 mg/dL — AB (ref 6–20)
CHLORIDE: 110 mmol/L (ref 101–111)
CO2: 19 mmol/L — AB (ref 22–32)
Calcium: 8.3 mg/dL — ABNORMAL LOW (ref 8.9–10.3)
Creatinine, Ser: 2.19 mg/dL — ABNORMAL HIGH (ref 0.44–1.00)
GFR calc Af Amer: 26 mL/min — ABNORMAL LOW (ref >60)
GFR calc non Af Amer: 22 mL/min — ABNORMAL LOW (ref >60)
Glucose, Bld: 103 mg/dL — ABNORMAL HIGH (ref 65–99)
POTASSIUM: 4.4 mmol/L (ref 3.5–5.1)
SODIUM: 137 mmol/L (ref 135–145)

## 2016-11-03 LAB — URINE CULTURE

## 2016-11-03 LAB — CBC
HEMATOCRIT: 32.2 % — AB (ref 36.0–46.0)
Hemoglobin: 10.3 g/dL — ABNORMAL LOW (ref 12.0–15.0)
MCH: 27.4 pg (ref 26.0–34.0)
MCHC: 32 g/dL (ref 30.0–36.0)
MCV: 85.6 fL (ref 78.0–100.0)
Platelets: 364 10*3/uL (ref 150–400)
RBC: 3.76 MIL/uL — AB (ref 3.87–5.11)
RDW: 18.6 % — AB (ref 11.5–15.5)
WBC: 10.7 10*3/uL — AB (ref 4.0–10.5)

## 2016-11-03 LAB — GLUCOSE, CAPILLARY
GLUCOSE-CAPILLARY: 99 mg/dL (ref 65–99)
GLUCOSE-CAPILLARY: 166 mg/dL — AB (ref 65–99)
Glucose-Capillary: 92 mg/dL (ref 65–99)
Glucose-Capillary: 92 mg/dL (ref 65–99)

## 2016-11-03 MED ORDER — AMLODIPINE BESYLATE 10 MG PO TABS
10.0000 mg | ORAL_TABLET | Freq: Every day | ORAL | Status: DC
  Administered 2016-11-03 – 2016-11-04 (×2): 10 mg
  Filled 2016-11-03 (×2): qty 1

## 2016-11-03 NOTE — Progress Notes (Signed)
Physical Therapy Treatment Patient Details Name: Joy Butler MRN: [id number] DOB: 11/08/1951 Today's Date: 11/03/2016    History of Present Illness 65 yo admitted with CAP with VDRF 10/22-10/29 with reintubation same day, bronchoscopy 10/31, extubated 11/2. PMhx: arthritis, back pain, DM, neuropathy, HTN, falls, PE    PT Comments    Pt presents with decline in functional mobility.  Pt is limited by urinary and stool incontinence as well as weakness.  Pt is not safe to stand at this time.  Pt with productive cough during session.     Follow Up Recommendations  CIR;Supervision/Assistance - 24 hour     Equipment Recommendations  None recommended by PT    Recommendations for Other Services       Precautions / Restrictions Precautions Precautions: Fall Restrictions Weight Bearing Restrictions: No    Mobility  Bed Mobility Overal bed mobility: Needs Assistance Bed Mobility: Supine to Sit;Sit to Supine Rolling: Mod assist   Supine to sit: Mod assist Sit to supine: Max assist;+2 for physical assistance   General bed mobility comments: Pt required cues for hand placement when rolling and assist with B LEs.    Transfers Overall transfer level:  (deferred, pt with multiple bouts of urine and stool incontinence.  Pt remains to weak to stand as she cannot hold her sitting balance without assistance.  )                  Ambulation/Gait                 Stairs            Wheelchair Mobility    Modified Rankin (Stroke Patients Only)       Balance Overall balance assessment: Needs assistance Sitting-balance support: Feet supported Sitting balance-Leahy Scale: Zero Sitting balance - Comments: Cues for righting and assistance to maintain sitting balance.   Postural control: Posterior lean     Standing balance comment: unable, pt decline from intial assessment.                      Cognition Arousal/Alertness:  Awake/alert;Lethargic Behavior During Therapy: Flat affect                        Exercises      General Comments        Pertinent Vitals/Pain Pain Assessment: 0-10 Pain Score: 8  Faces Pain Scale: Hurts little more Pain Location: bottom during perianal care.  Pt with repeated BM and urinary incontinence.   Pain Descriptors / Indicators: Aching;Sore;Burning Pain Intervention(s): Monitored during session;Repositioned    Home Living                      Prior Function            PT Goals (current goals can now be found in the care plan section) Acute Rehab PT Goals Patient Stated Goal: to feel better Potential to Achieve Goals: Good Progress towards PT goals: Progressing toward goals    Frequency    Min 3X/week      PT Plan Current plan remains appropriate    Co-evaluation             End of Session Equipment Utilized During Treatment: Gait belt;Oxygen Activity Tolerance: Patient limited by fatigue Patient left: with bed alarm set;in bed     Time: [id number] PT Time Calculation (min) (ACUTE ONLY): 50 min  Charges:  $Therapeutic Activity:  38-52 mins                    G Codes:      Cristela Blue 12/02/16, 5:34 PM  Governor Rooks, PTA pager [phone]

## 2016-11-03 NOTE — Progress Notes (Signed)
  Date: 11/03/2016  Patient name: Joy Butler  Medical record number: [id number]  Date of birth: [birth year]   This patient's plan of care was discussed with the house staff. Please see their note for complete details. I concur with their findings. Passed swallowing eval In bed, has been working with PT Had large bloody urination while working with PT.  Blood pressure (!) 145/65, pulse 77, temperature 97.6 F (36.4 C), temperature source Oral, resp. rate 18, height 5\' 9"  (1.753 m), weight 91.2 kg (201 lb 1 oz), SpO2 100 %. CV- RRR Chest- CTA Abd- BS+, soft, nontender.  Labs- h/h stable  A/P Hematuria Will consider uro eval  Dysphagia, Stridor Vocal chord ulcers She is significantly better.  Cr up slightly, Na stable.  Will watch.    Campbell Riches, MD 11/03/2016, 5:23 PM

## 2016-11-03 NOTE — Care Management Note (Signed)
Case Management Note  Patient Details  Name: BIANEY AMARAL MRN: [id number] Date of Birth: November 14, 1951  Subjective/Objective:              Patient from home with husband, transferred from 4E 11/10 with CAP. C/O abd pain today, will get scans of pelvis and abd. Approved for [facility], CSW following.      Action/Plan:  DC to SNF when medically clear.   Expected Discharge Date:  10/27/16               Expected Discharge Plan:  Skilled Nursing Facility  In-House Referral:  Clinical Social Work  Discharge planning Services  CM Consult  Post Acute Care Choice:    Choice offered to:     DME Arranged:    DME Agency:     HH Arranged:    [place] Agency:     Status of Service:  Completed, signed off  If discussed at [employer], dates discussed:    Additional Comments:  Carles Collet, RN 11/03/2016, 11:33 AM

## 2016-11-03 NOTE — Progress Notes (Signed)
Attempted to see pt this am. Pt away at tests. Will attempt back as schedule allows. Jinger Neighbors, OTR/L [phone]

## 2016-11-03 NOTE — Progress Notes (Signed)
Modified Barium Swallow Progress Note  Patient Details  Name: VELITA MACHIDA MRN: [id number] Date of Birth: 1951/02/07  Today's Date: 11/03/2016  Modified Barium Swallow completed.  Full report located under Chart Review in the Imaging Section.  Brief recommendations include the following:  Clinical Impression  Pt presents [facility] for all consistencies tested. Pt tolerated thin liquid via teaspoon, cup and straw with only very slight flash penetration noted with straw. Mildly prolonged mastication time with dry solid, however pt is edentulous. Recommend: Upgrade to Dys 3 with thin. Meds whole with puree. Will follow up.    Swallow Evaluation Recommendations       SLP Diet Recommendations: Dysphagia 3 (Mech soft) solids;Thin liquid   Liquid Administration via: Cup;Straw   Medication Administration: Whole meds with puree   Supervision: Staff to assist with self feeding   Compensations: Slow rate;Minimize environmental distractions;Small sips/bites   Postural Changes: Remain semi-upright after after feeds/meals (Comment);Seated upright at 90 degrees   Oral Care Recommendations: Oral care QID        [place], [place] Pager [phone] 11/03/2016,3:17 PM

## 2016-11-03 NOTE — Progress Notes (Signed)
Subjective: Patient was feeling better when seen this morning. Her lower abdominal pain has been improved. Denies any urinary complaints, except that she is being incontinent recently.  Objective:  Vital signs in last 24 hours: Vitals:   11/02/16 1451 11/02/16 2149 11/03/16 0628 11/03/16 0700  BP:  113/64 126/71   Pulse:  81 84   Resp:  (!) 22 20   Temp:  98.5 F (36.9 C) 97.7 F (36.5 C)   TempSrc:  Oral Oral   SpO2: 98% 97% 100%   Weight:    201 lb 1 oz (91.2 kg)  Height:       Gen.well-built, well-nourished, in no acute distress. Lungs.Mostly clear except a few upper respiratory transmitted sounds. CV. Regular rate and rhythm. Abdomen. Soft, nontender, nondistended, bowel sounds positive. Extremities.No edema, no cyanosis, pulses 2+ bilaterally.  Labs. CBC Latest Ref Rng & Units 11/03/2016 11/02/2016 11/02/2016  WBC 4.0 - 10.5 K/uL 10.7(H) 10.1 9.7  Hemoglobin 12.0 - 15.0 g/dL 10.3(L) 10.3(L) 9.7(L)  Hematocrit 36.0 - 46.0 % 32.2(L) 32.5(L) 30.9(L)  Platelets 150 - 400 K/uL 364 358 376   BMP Latest Ref Rng & Units 11/03/2016 11/02/2016 10/30/2016  Glucose 65 - 99 mg/dL 103(H) 110(H) 231(H)  BUN 6 - 20 mg/dL 29(H) 30(H) 42(H)  Creatinine 0.44 - 1.00 mg/dL 2.19(H) 1.90(H) 1.98(H)  BUN/Creat Ratio 12 - 28 - - -  Sodium 135 - 145 mmol/L 137 137 141  Potassium 3.5 - 5.1 mmol/L 4.4 4.6 4.5  Chloride 101 - 111 mmol/L 110 109 113(H)  CO2 22 - 32 mmol/L 19(L) 22 20(L)  Calcium 8.9 - 10.3 mg/dL 8.3(L) 8.2(L) 8.2(L)   Urinalysis    Component Value Date/Time   COLORURINE RED (A) 11/02/2016 1624   APPEARANCEUR TURBID (A) 11/02/2016 1624   LABSPEC 1.014 11/02/2016 1624   PHURINE 5.5 11/02/2016 1624   GLUCOSEU 100 (A) 11/02/2016 1624   HGBUR LARGE (A) 11/02/2016 1624   BILIRUBINUR MODERATE (A) 11/02/2016 1624   KETONESUR 15 (A) 11/02/2016 1624   PROTEINUR 100 (A) 11/02/2016 1624   UROBILINOGEN 1.0 02/07/2015 1424   NITRITE POSITIVE (A) 11/02/2016 1624   LEUKOCYTESUR  MODERATE (A) 11/02/2016 1624   Urine microscopic-add on  Order: [id number]  Status:  Final result Visible to patient:  No (Not Released) Next appt:  None    Ref Range & Units 1d ago 3wk ago   Squamous Epithelial / LPF NONE SEEN 0-5   6-30     WBC, UA 0 - 5 WBC/hpf 6-30  0-5    RBC / HPF 0 - 5 RBC/hpf TOO NUMEROUS TO COUNT  0-5    Bacteria, UA NONE SEEN NONE SEEN  MANY     Urine-Other  YEAST PRESENT  AMORPHOUS URATES/PH...       US Renal. FINDINGS: Right Kidney:  Length: 10.7 cm in length. Moderate hydronephrosis is present. There is dilatation of renal pelvis as well as calices. No focal mass.  Left Kidney:  Length: 11.3 cm. Echogenicity is normal. Small parapelvic cyst contains an internal septation but otherwise appears simple. Cysts has been stable since prior CT exams including 2013. No hydronephrosis or solid renal mass.  Bladder:  Appears normal for degree of bladder distention. Bilaterally ureteral jets are noted.  IMPRESSION: 1. Moderate right-sided hydronephrosis. 2. Consider further evaluation with CT of the abdomen and pelvis without contrast. 3. Stable left renal cyst.  CT Abdomen and Pelvis. FINDINGS: Lower chest: Subsegmental atelectasis LEFT lower lobe  Hepatobiliary: Gallbladder surgically absent.  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 No focal mass lesion.  Adrenals/Urinary Tract: Adrenal glands normal appearance. Vascular calcifications at kidneys bilaterally. No definite evidence of renal mass lesion or hydronephrosis. Ureters and bladder unremarkable.  Stomach/Bowel: Appendix surgically absent by history. Retained contrast within colon from prior swallowing function study. Stomach and bowel loops otherwise unremarkable.  Vascular/Lymphatic: Extensive atherosclerotic calcifications without aneurysm. No adenopathy.  Reproductive: By history post  hysterectomy.  Ovaries not visualized.  Other: Tiny periumbilical hernia containing fat. No free air free fluid.  Musculoskeletal: Scattered degenerative disc disease changes thoracolumbar spine. Facet degenerative changes lower lumbar spine especially L5-S1. Central acquired spinal stenosis L4-L5, multifactorial. Suspected broad-based disc herniation L5-S1.  IMPRESSION: No acute intra-abdominal or intrapelvic abnormalities.  Aortic atherosclerosis.  Tiny periumbilical hernia containing fat.  Central acquired spinal stenosis L4-L5.  Suspected broad-based disc herniation L5-S1.    Assessment/Plan:  Ms. Frutos, 65 y. 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 She was found to have multifocal pneumonia on chest x-ray, extubated on November 2,after 10 days of intubation.   Hematuria. Her UA is positive for blood, nitrates and leukocytes, negative for any bacteria. Renal ultrasound showing right-sided hydronephrosis, needing further evaluation with CT. -CT abdomen and pelvis- negative for any hydronephrosis. Her hematuria might be because of urethral injury due to prolonged Foleys. -Encouraged her to increase fluid intake. -Post void bladder scan needs to be done .  Constipation.resolved. -Continue scheduled Colace and MiraLAX.  Dysphagia and hoarse voice. she was able to swallow thin liquids on repeat barium swallow today. Her diet is being advanced to dysphagia 3 with thin liquids.  Acute on chronic kidney disease. her BUN and creatinine was mildly up as compared to yesterday today, most probably due to poor by mouth fluid intake. -Encouraged increased by mouth fluid intake, especially now when she can take thin liquids.  Strep pneumonia bacteremia and pneumonia. Resolved   Diabetes. Well controlled.Her CBG today morning was 92. Continue Lantus 5 units at bedtime and  sliding scale.  Hypertension.  she remained normotensive, over previous 24 hours, with change in her amlodipine, to be given at evening time instead of morning. -Continue losartan and HCTZ in the morning. -Amlodipine in the evening.  Dispo: Anticipated discharge in approximately 1 day(s).   Lorella Nimrod, MD 11/03/2016, 9:12 AM Pager: [phone]

## 2016-11-04 DIAGNOSIS — R339 Retention of urine, unspecified: Secondary | ICD-10-CM

## 2016-11-04 LAB — GLUCOSE, CAPILLARY
GLUCOSE-CAPILLARY: 89 mg/dL (ref 65–99)
GLUCOSE-CAPILLARY: 115 mg/dL — AB (ref 65–99)
Glucose-Capillary: 88 mg/dL (ref 65–99)
Glucose-Capillary: 125 mg/dL — ABNORMAL HIGH (ref 65–99)

## 2016-11-04 LAB — CBC
HCT: 31.8 % — ABNORMAL LOW (ref 36.0–46.0)
Hemoglobin: 10.1 g/dL — ABNORMAL LOW (ref 12.0–15.0)
MCH: 27.5 pg (ref 26.0–34.0)
MCHC: 31.8 g/dL (ref 30.0–36.0)
MCV: 86.6 fL (ref 78.0–100.0)
PLATELETS: 350 10*3/uL (ref 150–400)
RBC: 3.67 MIL/uL — AB (ref 3.87–5.11)
RDW: 18.8 % — AB (ref 11.5–15.5)
WBC: 10.4 10*3/uL (ref 4.0–10.5)

## 2016-11-04 LAB — BASIC METABOLIC PANEL
Anion gap: 7 (ref 5–15)
BUN: 29 mg/dL — ABNORMAL HIGH (ref 6–20)
CHLORIDE: 107 mmol/L (ref 101–111)
CO2: 20 mmol/L — ABNORMAL LOW (ref 22–32)
CREATININE: 2.17 mg/dL — AB (ref 0.44–1.00)
Calcium: 8.2 mg/dL — ABNORMAL LOW (ref 8.9–10.3)
GFR, EST AFRICAN AMERICAN: 26 mL/min — AB (ref >60)
GFR, EST NON AFRICAN AMERICAN: 22 mL/min — AB (ref >60)
Glucose, Bld: 103 mg/dL — ABNORMAL HIGH (ref 65–99)
POTASSIUM: 4.6 mmol/L (ref 3.5–5.1)
SODIUM: 134 mmol/L — AB (ref 135–145)

## 2016-11-04 MED ORDER — ENSURE ENLIVE PO LIQD: 237.0000 mL | Freq: Two times a day (BID) | ORAL | 12 refills | Status: DC

## 2016-11-04 MED ORDER — GUAIFENESIN-CODEINE 100-10 MG/5ML PO SOLN: 5.0000 mL | ORAL | 0 refills | Status: AC | PRN

## 2016-11-04 MED ORDER — FAMOTIDINE 20 MG PO TABS: 20.0000 mg | ORAL_TABLET | Freq: Every day | ORAL | 0 refills | Status: DC

## 2016-11-04 MED ORDER — LOSARTAN POTASSIUM 50 MG PO TABS: 50.0000 mg | ORAL_TABLET | Freq: Every day | ORAL | 2 refills | Status: DC

## 2016-11-04 MED ORDER — LOSARTAN POTASSIUM 50 MG PO TABS
50.0000 mg | ORAL_TABLET | Freq: Every day | ORAL | Status: DC
  Administered 2016-11-05: 50 mg via ORAL
  Filled 2016-11-04: qty 1

## 2016-11-04 MED ORDER — IPRATROPIUM-ALBUTEROL 0.5-2.5 (3) MG/3ML IN SOLN: 3.0000 mL | Freq: Three times a day (TID) | RESPIRATORY_TRACT | 0 refills | Status: DC

## 2016-11-04 MED ORDER — HYDROCHLOROTHIAZIDE 25 MG PO TABS: 25.0000 mg | ORAL_TABLET | Freq: Every day | ORAL | 2 refills | Status: DC

## 2016-11-04 MED ORDER — DOCUSATE SODIUM 50 MG/5ML PO LIQD: 100.0000 mg | Freq: Two times a day (BID) | ORAL | 0 refills | Status: DC

## 2016-11-04 MED ORDER — INSULIN GLARGINE 100 UNIT/ML ~~LOC~~ SOLN: 5.0000 [IU] | Freq: Every day | SUBCUTANEOUS | 11 refills | Status: DC

## 2016-11-04 MED ORDER — POLYETHYLENE GLYCOL 3350 17 G PO PACK: 17.0000 g | PACK | Freq: Every day | ORAL | 0 refills | Status: DC

## 2016-11-04 MED ORDER — ACETAMINOPHEN 325 MG PO TABS: 650.0000 mg | ORAL_TABLET | Freq: Four times a day (QID) | ORAL | Status: AC | PRN

## 2016-11-04 MED ORDER — MENTHOL 3 MG MT LOZG
1.0000 | LOZENGE | OROMUCOSAL | 12 refills | Status: DC | PRN
Start: 2016-11-04 — End: 2016-11-24

## 2016-11-04 MED ORDER — ALBUTEROL SULFATE (2.5 MG/3ML) 0.083% IN NEBU: 2.5000 mg | INHALATION_SOLUTION | RESPIRATORY_TRACT | Status: DC | PRN

## 2016-11-04 MED ORDER — BUDESONIDE 0.5 MG/2ML IN SUSP: 0.5000 mg | Freq: Two times a day (BID) | RESPIRATORY_TRACT | 12 refills | Status: DC

## 2016-11-04 NOTE — Progress Notes (Signed)
Occupational Therapy Treatment Patient Details Name: Joy Butler MRN: [id number] DOB: [birth year] Today's Date: 11/04/2016    History of present illness 65 yo admitted with CAP with VDRF 10/22-10/29 with reintubation same day, bronchoscopy 10/31, extubated 11/2. PMhx: arthritis, back pain, DM, neuropathy, HTN, falls, PE   OT comments  Limited treatment session due to c/o fatigue, stomach pain, and pt declining to participate in functional activities despite max verbal encouragement. Pt required mod assist for supine to sit and min guard for sit to supine. Pt unable to sit EOB without physical assist to maintain balance during grooming task. Updated d/c plan to SNF for continued rehab. Will continue to follow acutely.   Follow Up Recommendations  SNF;Supervision/Assistance - 24 hour    Equipment Recommendations  Other (comment) (TBD at next venue)    Recommendations for Other Services      Precautions / Restrictions Precautions Precautions: Fall Restrictions Weight Bearing Restrictions: No       Mobility Bed Mobility Overal bed mobility: Needs Assistance Bed Mobility: Supine to Sit;Sit to Supine     Supine to sit: Mod assist Sit to supine: Min guard   General bed mobility comments: Mod assist to facilitate weight shift and trunk elevation from supine to sit. Cues for hand placement and technique. Pt able to manage LEs out and into bed. Min assist to scoot up in bed with HOB flat and use of bed rail.  Transfers                 General transfer comment: Declined at this time.    Balance Overall balance assessment: Needs assistance Sitting-balance support: Feet supported;Bilateral upper extremity supported Sitting balance-Leahy Scale: Zero Sitting balance - Comments: Unable to maintain sitting balance without physical assist. Pt reports she feels like shes falling back to bed. Postural control: Posterior lean                         ADL Overall  ADL's : Needs assistance/impaired     Grooming: Minimal assistance;Sitting;Wash/dry face Grooming Details (indicate cue type and reason): Min asssit to maintain sitting balance.                               General ADL Comments: Limited participation in ADL/functional mobility this session despite max verbal encouragement; pt reports she is tired and her stomach hurts.      Vision                     Perception     Praxis      Cognition   Behavior During Therapy: Flat affect                         Extremity/Trunk Assessment               Exercises     Shoulder Instructions       General Comments      Pertinent Vitals/ Pain       Pain Assessment: Faces Faces Pain Scale: Hurts even more Pain Location: stomach Pain Descriptors / Indicators: Aching;Grimacing;Guarding Pain Intervention(s): Limited activity within patient's tolerance;Monitored during session  Home Living  Prior Functioning/Environment              Frequency  Min 2X/week        Progress Toward Goals  OT Goals(current goals can now be found in the care plan section)  Progress towards OT goals: Not progressing toward goals - comment (limited by pain/fatigue)  Acute Rehab OT Goals Patient Stated Goal: to feel better OT Goal Formulation: With patient  Plan Discharge plan needs to be updated    Co-evaluation                 End of Session     Activity Tolerance Patient limited by pain;Patient limited by fatigue   Patient Left in bed;with call bell/phone within reach;with bed alarm set   Nurse Communication Mobility status        Time: 1140-1153 OT Time Calculation (min): 13 min  Charges: OT General Charges $OT Visit: 1 Procedure OT Treatments $Self Care/Home Management : 8-22 mins  Binnie Kand M.S., OTR/L Pager: [phone]  11/04/2016, 12:16 PM

## 2016-11-04 NOTE — Progress Notes (Signed)
   Subjective: Patient was feeling better when seen this morning. She was happy that she can Drink Water Now. We told her that she is retaining urine after the removal of catheter, with post void residual volume more than 800 mL this morning. Still having hematuria. She denies any urinary complaints. She was agreeable to put another Foley in.  Objective:  Vital signs in last 24 hours: Vitals:   11/03/16 2049 11/04/16 0610 11/04/16 0808 11/04/16 0810  BP: (!) 126/56 (!) 150/71    Pulse: 95 82    Resp:  18    Temp: 97.5 F (36.4 C) 98.6 F (37 C)    TempSrc: Oral Oral    SpO2: 100% 98% 98% 98%  Weight:  195 lb 12.3 oz (88.8 kg)    Height:       Gen.well-built, well-nourished, in no acute distress. Lungs.Mostly clear except a few upper respiratory transmitted sounds. CV. Regular rate and rhythm. Abdomen. Soft, nontender, nondistended, bowel sounds positive. Extremities.No edema, no cyanosis, pulses 2+ bilaterally.  Labs. CBC Latest Ref Rng & Units 11/04/2016 11/03/2016 11/02/2016  WBC 4.0 - 10.5 K/uL 10.4 10.7(H) 10.1  Hemoglobin 12.0 - 15.0 g/dL 10.1(L) 10.3(L) 10.3(L)  Hematocrit 36.0 - 46.0 % 31.8(L) 32.2(L) 32.5(L)  Platelets 150 - 400 K/uL 350 364 358   BMP Latest Ref Rng & Units 11/04/2016 11/03/2016 11/02/2016  Glucose 65 - 99 mg/dL 103(H) 103(H) 110(H)  BUN 6 - 20 mg/dL 29(H) 29(H) 30(H)  Creatinine 0.44 - 1.00 mg/dL 2.17(H) 2.19(H) 1.90(H)  BUN/Creat Ratio 12 - 28 - - -  Sodium 135 - 145 mmol/L 134(L) 137 137  Potassium 3.5 - 5.1 mmol/L 4.6 4.4 4.6  Chloride 101 - 111 mmol/L 107 110 109  CO2 22 - 32 mmol/L 20(L) 19(L) 22  Calcium 8.9 - 10.3 mg/dL 8.2(L) 8.3(L) 8.2(L)    Assessment/Plan:  Ms. Deshotel, 65 y. 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 She was found to have multifocal pneumonia on chest x-ray, extubated on November 2,after 10 days of  intubation.  Hematuria and urinary retention.She is still having pink colored urine. She is having some overflow incontinence, her post void residual volume this morning was more than 800 mL. She is having atonic bladder with some urethral injury after prolonged use of Foley. Her urine culture grows yeast. She is asymptomatic. -We'll insert Foley again. -She  can be discharged to SNF with Foley. -She will need a follow-up with urologist.   Dysphagia and hoarse voice.  improving.she is able to tolerate thin liquids very well.she will need continuation of speech therapy.  Acute on chronic kidney disease.  creatinine is pretty much at baseline now.we will keep encouraging him to increase by mouth fluid intake.  Strep pneumonia bacteremia and pneumonia. Resolved.  Diabetes. Well controlled.Her CBG today morning was 88. Continue Lantus 5 units at bedtime and sliding scale.  Hypertension.She was having mildly elevated blood pressure this morning. She was on home dose of losartan 100 mg daily. We start her on at 25 mg daily, increase her dose to 50 mg daily. Continue hydrochlorothiazide and amlodipine.  Dispo:  being discharged today.  Lorella Nimrod, MD 11/04/2016, 10:33 AM Pager: [phone]

## 2016-11-04 NOTE — Progress Notes (Signed)
Pt bladder scanned this AM and it showed 835 mL of urine post bowel and bladder incontinence episode. Internal med teaching service paged and notified. Awaiting further orders at this time. Will continue to monitor pt.

## 2016-11-04 NOTE — Progress Notes (Signed)
CSW consulted with CSW Surveyor, quantity regarding Discharge plan. If we do not get insurance auth on 11/16, will pursue other log options.  Percell Locus Meldon Hanzlik LCSWA [phone]

## 2016-11-04 NOTE — Progress Notes (Signed)
Physical Therapy Treatment Patient Details Name: Joy Butler MRN: [id number] DOB: 1951-02-16 Today's Date: 11/04/2016    History of Present Illness 65 yo admitted with CAP with VDRF 10/22-10/29 with reintubation same day, bronchoscopy 10/31, extubated 11/2. PMhx: arthritis, back pain, DM, neuropathy, HTN, falls, PE    PT Comments    The patient is making progress toward goals.  She was more alert and smiling today and was able to tolerate transferring from the bed to the chair.  Pt was able to complete some strengthening exercises while seated in the chair with assistance from SPTA.  Pt will benefit from further PT to increase her strength, endurance, and balance.  Continue with POC.  Follow Up Recommendations  CIR;Supervision/Assistance - 24 hour     Equipment Recommendations  None recommended by PT    Recommendations for Other Services Rehab consult     Precautions / Restrictions Precautions Precautions: Fall Restrictions Weight Bearing Restrictions: No    Mobility  Bed Mobility Overal bed mobility: Needs Assistance Bed Mobility: Rolling;Sidelying to Sit Rolling: Min assist Sidelying to sit: Mod assist       General bed mobility comments: Mod assist to facilitate weight shift and trunk elevation from supine to sit. Cues for hand placement and technique. Pt needed. Min assist to navigate LEs OOB.  Transfers Overall transfer level: Needs assistance Equipment used:  (Used Stedy for transfer.) Transfers: Sit to/from Stand Sit to Stand: Mod assist;+2 physical assistance         General transfer comment: Pt required mod assist for sequence and technique. Used bed pad to elevate pt off of bed. Verbal and tactile cues needed to place hands on the [place].  When transferring from [facility] to the chair pt had poor eccentric control during descent to seated surface.  Ambulation/Gait             General Gait Details: pt declined at this time secondary to  fatigue   Stairs            Wheelchair Mobility    Modified Rankin (Stroke Patients Only)       Balance Overall balance assessment: Needs assistance Sitting-balance support: Bilateral upper extremity supported;Feet supported Sitting balance-Leahy Scale: Poor Sitting balance - Comments: Pt required min A to sit on EOB.                            Cognition Arousal/Alertness: Awake/alert Behavior During Therapy: WFL for tasks assessed/performed Overall Cognitive Status: Within Functional Limits for tasks assessed     Current Attention Level: Sustained                Exercises Total Joint Exercises Ankle Circles/Pumps: AROM;Both;10 reps;Seated Long Arc Quad: AAROM;Both;5 reps;Seated Marching in Standing: AAROM;Both;10 reps;Seated (While seated.)    General Comments        Pertinent Vitals/Pain Pain Assessment: Faces Faces Pain Scale: Hurts little more Pain Location: stomach Pain Descriptors / Indicators: Grimacing Pain Intervention(s): Limited activity within patient's tolerance;Monitored during session;Repositioned    Home Living                      Prior Function            PT Goals (current goals can now be found in the care plan section) Acute Rehab PT Goals Patient Stated Goal: to feel better PT Goal Formulation: With patient Time For Goal Achievement: 11/04/16 Potential to Achieve Goals: Good Progress towards  PT goals: Progressing toward goals    Frequency    Min 3X/week      PT Plan Current plan remains appropriate    Co-evaluation             End of Session Equipment Utilized During Treatment: Gait belt Activity Tolerance: Patient tolerated treatment well Patient left: in chair;with call bell/phone within reach;with chair alarm set     Time: [id number] PT Time Calculation (min) (ACUTE ONLY): 29 min  Charges:  $Therapeutic Activity: 23-37 mins                    G Codes:      Bary Castilla Nov 08, 2016, 4:43 PM  Rito Ehrlich. [place], [place]

## 2016-11-04 NOTE — Discharge Summary (Signed)
Name: Joy Butler MRN: [id number] DOB: Sep 24, 1951 65 y.o. PCP: Joy Estelle, MD  Date of Admission: 10/10/2016 11:42 PM Date of Discharge: 11/04/2016 Attending Physician: Annia Belt, MD  Discharge Diagnosis: 1. Strep pneumoniae bacteremia/sepsis. 2. Strep pneumoniae pneumonia 3.ARDS 4. Posterior vocal cord ulceration. 5. Hypertension 6. Diabetes 7. 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   Discharge Medications:   Medication List    STOP taking these medications   insulin NPH-regular Human (70-30) 100 UNIT/ML injection Commonly known as:  NOVOLIN 70/30   losartan-hydrochlorothiazide 100-25 MG tablet Commonly known as:  HYZAAR     TAKE these medications   acetaminophen 325 MG tablet Commonly known as:  TYLENOL Take 2 tablets (650 mg total) by mouth every 6 (six) hours as needed for mild pain, moderate pain, fever or headache.   amLODipine 10 MG tablet Commonly known as:  NORVASC Take 1 tablet (10 mg total) by mouth daily.   budesonide 0.5 MG/2ML nebulizer solution Commonly known as:  PULMICORT Take 2 mLs (0.5 mg total) by nebulization 2 (two) times daily.   docusate 50 MG/5ML liquid Commonly known as:  COLACE Place 10 mLs (100 mg total) into feeding tube 2 (two) times daily.   famotidine 20 MG tablet Commonly known as:  PEPCID Take 1 tablet (20 mg total) by mouth daily. Start taking on:  11/05/2016   feeding supplement (ENSURE ENLIVE) Liqd Take 237 mLs by mouth 2 (two) times daily between meals.   ferrous sulfate 325 (65 FE) MG tablet Take 1 tablet (325 mg total) by mouth daily.   guaiFENesin-codeine 100-10 MG/5ML syrup Take 5 mLs by mouth every 4 (four) hours as needed for cough.   hydrochlorothiazide 25 MG tablet Commonly known as:  HYDRODIURIL Take 1 tablet (25 mg total) by mouth daily. Start taking on:  11/05/2016   insulin glargine 100 UNIT/ML injection Commonly known as:  LANTUS Inject 0.05 mLs (5 Units total) into the  skin at bedtime.   ipratropium-albuterol 0.5-2.5 (3) MG/3ML Soln Commonly known as:  DUONEB Take 3 mLs by nebulization 3 (three) times daily.   loratadine 10 MG tablet Commonly known as:  CLARITIN TAKE 1 TABLET BY MOUTH ONCE DAILY   losartan 50 MG tablet Commonly known as:  COZAAR Take 1 tablet (50 mg total) by mouth daily. Start taking on:  11/05/2016   menthol-cetylpyridinium 3 MG lozenge Commonly known as:  CEPACOL Take 1 lozenge (3 mg total) by mouth as needed for sore throat.   polyethylene glycol packet Commonly known as:  MIRALAX / GLYCOLAX Place 17 g into feeding tube daily. Start taking on:  11/05/2016   pravastatin 40 MG tablet Commonly known as:  PRAVACHOL TAKE 1 TABLET BY MOUTH EVERY EVENING   Rivaroxaban 15 MG Tabs tablet Commonly known as:  XARELTO Take 1 tablet (15 mg total) by mouth daily with supper. Please counsel on adherence       Disposition and follow-up:   Joy Butler was discharged from [facility] in Stable condition.  At the hospital follow up visit please address:  1.  Resolution of hematuria, whether they were able to remove catheter without any more retention. Her blood pressure, and titrate her losartan accordingly.  2.  Labs / imaging needed at time of follow-up: BMP  3.  Pending labs/ test needing follow-up: None  Follow-up Appointments: Follow-up Information    [facility] Follow up.   Why:  They will call patient's husband with appointment. Contact information: [phone] [place]       Joy Estelle, MD Follow up.   Specialty:  Internal Medicine Why:  Please call and make an appointment after your discharge from nursing facility. Contact information: [place] [id number] [place] Hospital Course by problem list:   Joy Butler, 66 y. 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   Strep pneumonia bacteremia and pneumonia. She was found to have multifocal pneumonia on chest x-ray, later transferred to ICU because of worsening hypoxemia/acidosis despite BiPAP requiring intubation to protect airways. Later found to have strep pneumonia, with positive blood cultures for strep pneumonia. She was treated with  -vancomycin from October 22 to November 3. -Levaquin from October 22 to October 30. -Primaxin from October 31 to November 3 She was first extubated on October 29, and then reintubated the same day due to increased respiratory secretions. Eventually weaned off from vent and extubated on on November 2.  Her pneumonia resolved on subsequent chest x-rays, she completed her antibiotic course. Remained afebrile, with gradually improving cough during the rest of her stay.  Dysphagia and hoarse voice. She developed stridor and dysphagia because of prolonged intubation, she failed swallow study. Found to have ulceration of posterior cord with good mobility on the laryngeal scope. She refused NG tube. She was initially maintained on dextrose and half normal saline, next day was able to swallow pudding thick food. Her dysphagia and voice slowly improved to the point that on November 14, she was able to swallow thin liquids including water. She still needs speech therapy for her hoarseness.  Acute on chronic kidney disease.She became volume overload because of excessive fluid resuscitation due to shock, echo done on October 22 shows mild worsening of LVEF 40-45%(50-55% on 08/2012) with mild LDH and grade 1 diastolic dysfunction. She was then diuresed and lost 9 pounds over 24 hour. Net fluid loss of -5411. Her kidney function worsened initially. Later with gentle rehydration, her electrolyte abnormalities and kidney function started to improve. In next few days she was reached to her baseline.  Hematuria  and Atonic bladder. She developed hematuria and urinary retention after removal of Foley. Her urine analysis and culture just grew yeast. Her renal ultrasound initially showed some hydronephrosis CT was negative for any obstruction or hydronephrosis .her most probable cause of hematuria as urethral injury after prolonged catheterization. She remains completely asymptomatic with overflow incontinence. Her post void bladder scan shows more than 800 mL of urine she was recatheterized, she is being discharged to SNF with Foley. She needs to follow up with a urology. [facility] was contacted, they will call her husband with appointment.   Hypertension. She was on amlodipine 10 mg and a combination pill containing losartan 100 mg and hydrochlorothiazide 25 mg at home. On admission she was hypotensive, her antihypertensives were held. Later on with improvement in her blood pressure, when her blood pressure remained elevated, her home meds were slowly introduced. She was discharged on amlodipine 10 mg, losartan 50 mg and hydrochlorothiazide 25 mg. Her losartan can be uptitrated if needed.  Diabetes. She has well-controlled diabetes, last A1c was 6.2 on 07/06/2016. She was initially managed with sliding scale, later Lantus 5 units at bedtime was added. She has well-controlled CBGs on just 5 units of Lantus.  History of  reoccurring PE. She was on Xarelto at home. She developed coagulopathy on Coumadin requiring vitamin K. She was initially on heparin infusion, later Lovenox was used she was discharged on her home dose of Xarelto.  Discharge Vitals:   BP (!) 150/71 (BP Location: Right Arm)   Pulse 82   Temp 98.6 F (37 C) (Oral)   Resp 18   Ht 5\' 9"  (1.753 m)   Wt 195 lb 12.3 oz (88.8 kg)   SpO2 98%   BMI 28.91 kg/m   Gen.well-built, well-nourished, in no acute distress. Lungs.Mostly clear except a few upper respiratory transmitted sounds. CV. Regular rate and rhythm. Abdomen. Soft, nontender,  nondistended, bowel sounds positive. Extremities.No edema, no cyanosis, pulses 2+ bilaterally.  Pertinent Labs, Studies, and Procedures:  CBC Latest Ref Rng & Units 11/04/2016 11/03/2016 11/02/2016  WBC 4.0 - 10.5 K/uL 10.4 10.7(H) 10.1  Hemoglobin 12.0 - 15.0 g/dL 10.1(L) 10.3(L) 10.3(L)  Hematocrit 36.0 - 46.0 % 31.8(L) 32.2(L) 32.5(L)  Platelets 150 - 400 K/uL 350 364 358   BMP Latest Ref Rng & Units 11/04/2016 11/03/2016 11/02/2016  Glucose 65 - 99 mg/dL 103(H) 103(H) 110(H)  BUN 6 - 20 mg/dL 29(H) 29(H) 30(H)  Creatinine 0.44 - 1.00 mg/dL 2.17(H) 2.19(H) 1.90(H)  BUN/Creat Ratio 12 - 28 - - -  Sodium 135 - 145 mmol/L 134(L) 137 137  Potassium 3.5 - 5.1 mmol/L 4.6 4.4 4.6  Chloride 101 - 111 mmol/L 107 110 109  CO2 22 - 32 mmol/L 20(L) 19(L) 22  Calcium 8.9 - 10.3 mg/dL 8.2(L) 8.3(L) 8.2(L)   Urine antigen for strep pneumonia. Positive  Blood Culture (routine x 2)  Order: [id number]  Status:  Final result Visible to patient:  No (Not Released) Next appt:  None  Newer results are available. Click to view them now.   3wk ago  Specimen Description BLOOD LEFT ANTECUBITAL   Special Requests BOTTLES DRAWN AEROBIC AND ANAEROBIC 5CC   Culture Setup Time GRAM POSITIVE COCCI IN PAIRS  IN BOTH AEROBIC AND ANAEROBIC BOTTLES  CRITICAL RESULT CALLED TO, READ BACK BY AND VERIFIED WITH: L. CURRAN, PHARM AT [id number] ON [id number] BY S. YARBROUGH       Culture  (A)  STREPTOCOCCUS PNEUMONIAE SUSCEPTIBILITIES PERFORMED ON PREVIOUS CULTURE WITHIN THE LAST 5 DAYS.        Susceptibility    Streptococcus pneumoniae    MIC    CEFTRIAXONE  Sensitive 1    ERYTHROMYCIN >=8 RESISTANT  Resistant    LEVOFLOXACIN 0.5 SENSITIVE  Sensitive    PENICILLIN  Sensitive 2         1 (Streptococcus pneumoniae/CEFTRIAXONE)   SENSITIVE <=0.12  2 (Streptococcus pneumoniae/PENICILLIN)   SENSITIVE     Urinalysis    Component Value Date/Time   COLORURINE RED (A) 11/02/2016 1624   APPEARANCEUR TURBID (A)  11/02/2016 1624   LABSPEC 1.014 11/02/2016 1624   PHURINE 5.5 11/02/2016 1624   GLUCOSEU 100 (A) 11/02/2016 1624   HGBUR LARGE (A) 11/02/2016 1624   BILIRUBINUR MODERATE (A) 11/02/2016 1624   KETONESUR 15 (A) 11/02/2016 1624   PROTEINUR 100 (A) 11/02/2016 1624   UROBILINOGEN 1.0 02/07/2015 1424   NITRITE POSITIVE (A) 11/02/2016 1624   LEUKOCYTESUR MODERATE (A) 11/02/2016 1624   Urine microscopic-add on  Order: [id number]  Status:  Final result Visible to patient:  No (Not Released) Next appt:  None    Ref Range & Units 1d ago 3wk ago   Squamous Epithelial / LPF NONE SEEN 0-5  6-30     WBC, UA 0 - 5 WBC/hpf 6-30  0-5    RBC / HPF 0 - 5 RBC/hpf TOO NUMEROUS TO COUNT  0-5    Bacteria, UA NONE SEEN NONE SEEN  MANY     Urine-Other  YEAST PRESENT  AMORPHOUS URATES/PH...       Culture, Urine  Order: [id number]  Status:  Final result Visible to patient:  No (Not Released) Next appt:  None   2d ago  Specimen Description URINE, CLEAN CATCH   Special Requests NONE   Culture >=100,000 COLONIES/mL YEAST    Report Status 11/03/2016 FINAL       CXR ON Admission. FINDINGS: Dense RIGHT greater than LEFT alveolar airspace opacities. Cardiac silhouette is mildly enlarged and unchanged. Mildly calcified aortic knob. No pleural effusion. No pneumothorax. Severe RIGHT and moderate LEFT shoulder degenerative change.  IMPRESSION: Multifocal pneumonia. Followup PA and lateral chest X-ray is recommended in 3-4 weeks following trial of antibiotic therapy to ensure resolution and exclude underlying malignancy.  Mild cardiomegaly.  CXR On Discharge. FINDINGS: There is a left central line again identified. The distal tip is slightly deflected superiorly, improved in the interval. The distal tip is likely in the mid SVC. No pneumothorax. The cardiomediastinal silhouette is stable. No pulmonary nodules, masses, or focal infiltrates.  IMPRESSION: Left central line as above. No  pneumothorax. No acute abnormalities.  CT Chest. On Admission. IMPRESSION: Extensive bilateral airspace consolidative changes most compatible with multifocal pneumonia. Clinical correlation and follow-up resolution recommended.  Mild cardiomegaly with multi vessel coronary artery disease.  US Renal. FINDINGS: Right Kidney:  Length: 10.7 cm in length. Moderate hydronephrosis is present. There is dilatation of renal pelvis as well as calices. No focal mass.  Left Kidney:  Length: 11.3 cm. Echogenicity is normal. Small parapelvic cyst contains an internal septation but otherwise appears simple. Cysts has been stable since prior CT exams including 2013. No hydronephrosis or solid renal mass.  Bladder:  Appears normal for degree of bladder distention. Bilaterally ureteral jets are noted.  IMPRESSION: 1. Moderate right-sided hydronephrosis. 2. Consider further evaluation with CT of the abdomen and pelvis without contrast. 3. Stable left renal cyst.  CT Abdomen and Pelvis. FINDINGS: Lower chest: Subsegmental atelectasis LEFT lower lobe  Hepatobiliary: Gallbladder surgically absent. 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 No focal mass lesion.  Adrenals/Urinary Tract: Adrenal glands normal appearance. Vascular calcifications at kidneys bilaterally. No definite evidence of renal mass lesion or hydronephrosis. Ureters and bladder unremarkable.  Stomach/Bowel: Appendix surgically absent by history. Retained contrast within colon from prior swallowing function study. Stomach and bowel loops otherwise unremarkable.  Vascular/Lymphatic: Extensive atherosclerotic calcifications without aneurysm. No adenopathy.  Reproductive: By history post hysterectomy. Ovaries not visualized.  Other: Tiny periumbilical hernia containing fat. No free air free fluid.  Musculoskeletal: Scattered  degenerative disc disease changes thoracolumbar spine. Facet degenerative changes lower lumbar spine especially L5-S1. Central acquired spinal stenosis L4-L5, multifactorial. Suspected broad-based disc herniation L5-S1.  IMPRESSION: No acute intra-abdominal or intrapelvic abnormalities.  Aortic atherosclerosis.  Tiny periumbilical hernia containing fat.  Central acquired spinal stenosis L4-L5.  Suspected broad-based disc herniation L5-S1.  Discharge Instructions: Discharge Instructions    Diet - low sodium heart healthy    Complete by:  As directed    Discharge instructions    Complete by:  As directed    It was pleasure taking care of you. I am glad that you are improving.  We contacted urologist for your bladder problem, they will call your husband with appointment. Please call the clinic when you are discharged from that facility to make a follow-up appointment.   Increase activity slowly    Complete by:  As directed       Signed: Lorella Nimrod, MD 11/04/2016, 12:05 PM   Pager: [phone]

## 2016-11-04 NOTE — Progress Notes (Signed)
Notified by [place] CSW that insurance authorization has expired for SNF. Patient with DC order. Spoke with Dr Benjamine Mola, FM resident about DC plan. Patient predominately independent PTA, used walker. After prolonged hospital stay of 24 days including ICU stay with patient requiring intubation patient extremely deconditioned as compared to pre hospitalization. Patient will also be DC'd with indwelling urinary catheter. Per CM assessment, and in agreement with MD, patient requiring more care than husband alone could safely provide if DC'd to home tonight. (Home Health allotted 48 hours after discharge to initiate care at home.) Patient to be presented at [facility] Length of Stay meeting in AM, team to discuss and assist in coordination discharge plan and SNF placement.

## 2016-11-05 DIAGNOSIS — R338 Other retention of urine: Secondary | ICD-10-CM

## 2016-11-05 DIAGNOSIS — R1319 Other dysphagia: Secondary | ICD-10-CM

## 2016-11-05 DIAGNOSIS — R319 Hematuria, unspecified: Secondary | ICD-10-CM

## 2016-11-05 DIAGNOSIS — R5383 Other fatigue: Secondary | ICD-10-CM

## 2016-11-05 DIAGNOSIS — A419 Sepsis, unspecified organism: Secondary | ICD-10-CM

## 2016-11-05 LAB — CBC
HCT: 29.8 % — ABNORMAL LOW (ref 36.0–46.0)
Hemoglobin: 9.5 g/dL — ABNORMAL LOW (ref 12.0–15.0)
MCH: 27.5 pg (ref 26.0–34.0)
MCHC: 31.9 g/dL (ref 30.0–36.0)
MCV: 86.4 fL (ref 78.0–100.0)
Platelets: 329 10*3/uL (ref 150–400)
RBC: 3.45 MIL/uL — ABNORMAL LOW (ref 3.87–5.11)
RDW: 18.6 % — AB (ref 11.5–15.5)
WBC: 10.3 10*3/uL (ref 4.0–10.5)

## 2016-11-05 LAB — BASIC METABOLIC PANEL
ANION GAP: 7 (ref 5–15)
BUN: 26 mg/dL — AB (ref 6–20)
CALCIUM: 8.2 mg/dL — AB (ref 8.9–10.3)
CO2: 22 mmol/L (ref 22–32)
Chloride: 108 mmol/L (ref 101–111)
Creatinine, Ser: 2.23 mg/dL — ABNORMAL HIGH (ref 0.44–1.00)
GFR calc Af Amer: 25 mL/min — ABNORMAL LOW (ref >60)
GFR, EST NON AFRICAN AMERICAN: 22 mL/min — AB (ref >60)
GLUCOSE: 101 mg/dL — AB (ref 65–99)
Potassium: 4.6 mmol/L (ref 3.5–5.1)
SODIUM: 137 mmol/L (ref 135–145)

## 2016-11-05 LAB — GLUCOSE, CAPILLARY
GLUCOSE-CAPILLARY: 94 mg/dL (ref 65–99)
GLUCOSE-CAPILLARY: 138 mg/dL — AB (ref 65–99)

## 2016-11-05 MED ORDER — ALBUTEROL SULFATE (2.5 MG/3ML) 0.083% IN NEBU: 2.5000 mg | INHALATION_SOLUTION | RESPIRATORY_TRACT | 12 refills | Status: AC | PRN

## 2016-11-05 NOTE — Progress Notes (Signed)
Called report to nurse Mendel Ryder at [facility]. Reviewed patient's past medical history, current hospital course, vitals, and most recent diagnostic results. Anticipate discharge this evening via ambulance. Will continue to monitor.

## 2016-11-05 NOTE — Progress Notes (Signed)
Speech Language Pathology Treatment: Dysphagia  Patient Details Name: Joy Butler MRN: [id number] DOB: 1951/02/20 Today's Date: 11/05/2016 Time: 8099-8338 SLP Time Calculation (min) (ACUTE ONLY): 19 min  Assessment / Plan / Recommendation Clinical Impression  Pt demonstrates consistent findings with last MBS; pt tolerated thin liquids and regular solids, though intake was followed by one cough, likely unrelated given findings of normal swallow on MBS. No SLP f/u needed as pt is tolerating diet. Will sign off.    HPI HPI: [name] with PMH of PE (non-compliant with xarelto), HTN, CKD, HLD admitted 10/22 per IMTS with multifocal PNA. Developed worsening respiratory failure requiring intubation 10/22-10/29 with reintubation 10/29-11/2.  MBS 11/6 revealed moderate dysphagia with gross penetration with liquids nectar or thinner.  Pt did not aspirate honey thick liquids on multiple trials. ENT eval revealed bilateral ulcers on vocal folds secondary to intubations - their presence explains dysphagia.       SLP Plan  All goals met     Recommendations  Diet recommendations: Regular;Thin liquid Liquids provided via: Cup Medication Administration: Whole meds with puree Supervision: Intermittent supervision to cue for compensatory strategies Compensations: Slow rate;Minimize environmental distractions;Small sips/bites Postural Changes and/or Swallow Maneuvers: Seated upright 90 degrees                Plan: All goals met       GO               [name], MA CCC-SLP [phone]  Lynann Beaver 11/05/2016, 10:40 AM

## 2016-11-05 NOTE — Care Management Important Message (Signed)
Important Message  Patient Details  Name: Joy Butler MRN: [id number] Date of Birth: February 24, 1951   Medicare Important Message Given:  Yes    [name] May 11/05/2016, 9:52 AM

## 2016-11-05 NOTE — Clinical Social Work Placement (Signed)
   CLINICAL SOCIAL WORK PLACEMENT  NOTE  Date:  11/05/2016  Patient Details  Name: Joy Butler MRN: [id number] Date of Birth: 1951/12/15  Clinical Social Work is seeking post-discharge placement for this patient at the [place] level of care (*CSW will initial, date and re-position this form in  chart as items are completed):  Yes   Patient/family provided with [place] Work Department's list of facilities offering this level of care within the geographic area requested by the patient (or if unable, by the patient's family).  Yes   Patient/family informed of their freedom to choose among providers that offer the needed level of care, that participate in Medicare, Medicaid or managed care program needed by the patient, have an available bed and are willing to accept the patient.  Yes   Patient/family informed of [facility]'s ownership interest in [facility] and [facility], as well as of the fact that they are under no obligation to receive care at these facilities.  PASRR submitted to EDS on 10/22/16     PASRR number received on 10/22/16     Existing PASRR number confirmed on       FL2 transmitted to all facilities in geographic area requested by pt/family on 10/27/16     FL2 transmitted to all facilities within larger geographic area on       Patient informed that his/her managed care company has contracts with or will negotiate with certain facilities, including the following:        Yes   Patient/family informed of bed offers received.  Patient chooses bed at [place] recommends and patient chooses bed at      Patient to be transferred to [facility] on 11/05/16.  Patient to be transferred to facility by [facility]     Patient family notified on 11/05/16 of transfer.  Name of family member notified:  Sister     PHYSICIAN       Additional Comment:     _______________________________________________ Benard Halsted, [place] 11/05/2016, 11:50 AM

## 2016-11-05 NOTE — Progress Notes (Signed)
   Subjective: Patient was feeling better. Still with blood tinged urine in foley bag. No complaints. Agreeable to SNF today. Bed confirmed.  Objective:  Vital signs in last 24 hours: Vitals:   11/04/16 2133 11/05/16 0506 11/05/16 0807 11/05/16 0810  BP: (!) 137/50 (!) 147/68  (!) 153/68  Pulse: 78 90  89  Resp: 18 20    Temp: 98.5 F (36.9 C) 99.7 F (37.6 C)    TempSrc: Oral Oral    SpO2: 96% 98% 99% 99%  Weight:  89.3 kg (196 lb 13.9 oz)    Height:       Gen.well-built, well-nourished, in no acute distress. Lungs.Mostly clear except a few upper respiratory transmitted sounds. CV. Regular rate and rhythm. Abdomen. Soft, nontender, nondistended, bowel sounds positive. Extremities.No edema, no cyanosis, pulses 2+ bilaterally.  Labs. CBC Latest Ref Rng & Units 11/05/2016 11/04/2016 11/03/2016  WBC 4.0 - 10.5 K/uL 10.3 10.4 10.7(H)  Hemoglobin 12.0 - 15.0 g/dL 9.5(L) 10.1(L) 10.3(L)  Hematocrit 36.0 - 46.0 % 29.8(L) 31.8(L) 32.2(L)  Platelets 150 - 400 K/uL 329 350 364   BMP Latest Ref Rng & Units 11/05/2016 11/04/2016 11/03/2016  Glucose 65 - 99 mg/dL 101(H) 103(H) 103(H)  BUN 6 - 20 mg/dL 26(H) 29(H) 29(H)  Creatinine 0.44 - 1.00 mg/dL 2.23(H) 2.17(H) 2.19(H)  BUN/Creat Ratio 12 - 28 - - -  Sodium 135 - 145 mmol/L 137 134(L) 137  Potassium 3.5 - 5.1 mmol/L 4.6 4.6 4.4  Chloride 101 - 111 mmol/L 108 107 110  CO2 22 - 32 mmol/L 22 20(L) 19(L)  Calcium 8.9 - 10.3 mg/dL 8.2(L) 8.2(L) 8.3(L)    Assessment/Plan:  Joy Butler, 65 y. 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 She was found to have multifocal pneumonia on chest x-ray, extubated on November 2,after 10 days of intubation.  Hematuria and urinary retention.She is still having pink colored urine. Plan to DC with foley and f/u made w/ Urology. Had residual of 800 mL. Likey 2/2 atonic bladder with some urethral injury  after prolonged use of Foley. Asymptomatic   Dysphagia and hoarse voice.  Cont speech therapy on DC.  Acute on chronic kidney disease.  Resolved.  Strep pneumonia bacteremia and pneumonia. Resolved.  Diabetes. Well controlled.Continue Lantus 5 units at bedtime and sliding scale.  Hypertension.Losartan 50 mg daily (Home dose was 100mg ). Continue home hydrochlorothiazide and amlodipine.  Dispo:  Being discharged to SNF today.  Holley Raring, MD 11/05/2016, 11:33 AM Pager: [phone]

## 2016-11-05 NOTE — Discharge Summary (Signed)
Name: Joy Butler MRN: [id number] DOB: Jan 09, 1951 65 y.o. PCP: [name], MD  Date of Admission: 10/10/2016 11:42 PM Date of Discharge: 11/05/2016 Attending Physician: Axel Filler, MD  Discharge Diagnosis: 1. Strep pneumoniae bacteremia/sepsis. 2. Strep pneumoniae pneumonia 3.ARDS 4. Posterior vocal cord ulceration. 5. Hypertension 6. Diabetes 7. 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   Discharge Medications:   Medication List    STOP taking these medications   insulin NPH-regular Human (70-30) 100 UNIT/ML injection Commonly known as:  NOVOLIN 70/30   losartan-hydrochlorothiazide 100-25 MG tablet Commonly known as:  HYZAAR     TAKE these medications   acetaminophen 325 MG tablet Commonly known as:  TYLENOL Take 2 tablets (650 mg total) by mouth every 6 (six) hours as needed for mild pain, moderate pain, fever or headache.   albuterol (2.5 MG/3ML) 0.083% nebulizer solution Commonly known as:  PROVENTIL Take 3 mLs (2.5 mg total) by nebulization every 4 (four) hours as needed for wheezing or shortness of breath.   amLODipine 10 MG tablet Commonly known as:  NORVASC Take 1 tablet (10 mg total) by mouth daily.   budesonide 0.5 MG/2ML nebulizer solution Commonly known as:  PULMICORT Take 2 mLs (0.5 mg total) by nebulization 2 (two) times daily.   docusate 50 MG/5ML liquid Commonly known as:  COLACE Place 10 mLs (100 mg total) into feeding tube 2 (two) times daily.   famotidine 20 MG tablet Commonly known as:  PEPCID Take 1 tablet (20 mg total) by mouth daily.   feeding supplement (ENSURE ENLIVE) Liqd Take 237 mLs by mouth 2 (two) times daily between meals.   ferrous sulfate 325 (65 FE) MG tablet Take 1 tablet (325 mg total) by mouth daily.   guaiFENesin-codeine 100-10 MG/5ML syrup Take 5 mLs by mouth every 4 (four) hours as needed for cough.   hydrochlorothiazide 25 MG tablet Commonly known as:  HYDRODIURIL Take 1 tablet (25  mg total) by mouth daily.   insulin glargine 100 UNIT/ML injection Commonly known as:  LANTUS Inject 0.05 mLs (5 Units total) into the skin at bedtime.   ipratropium-albuterol 0.5-2.5 (3) MG/3ML Soln Commonly known as:  DUONEB Take 3 mLs by nebulization 3 (three) times daily.   loratadine 10 MG tablet Commonly known as:  CLARITIN TAKE 1 TABLET BY MOUTH ONCE DAILY   losartan 50 MG tablet Commonly known as:  COZAAR Take 1 tablet (50 mg total) by mouth daily.   menthol-cetylpyridinium 3 MG lozenge Commonly known as:  CEPACOL Take 1 lozenge (3 mg total) by mouth as needed for sore throat.   polyethylene glycol packet Commonly known as:  MIRALAX / GLYCOLAX Place 17 g into feeding tube daily.   pravastatin 40 MG tablet Commonly known as:  PRAVACHOL TAKE 1 TABLET BY MOUTH EVERY EVENING   Rivaroxaban 15 MG Tabs tablet Commonly known as:  XARELTO Take 1 tablet (15 mg total) by mouth daily with supper. Please counsel on adherence       Disposition and follow-up:   [name] was discharged from [facility] in Stable condition.  At the hospital follow up visit please address:  1.  Resolution of hematuria, whether they were able to remove catheter without any more retention. Her blood pressure, and titrate her losartan accordingly.  2.  Labs / imaging needed at time of follow-up: BMP, q4 days CBC while gross hematuria.  3.  Pending labs/ test needing follow-up: None  Follow-up Appointments: Follow-up Information  [facility] Follow up.   Why:  They will call patient's husband with appointment. Contact information: [address] [place]       [name], MD Follow up.   Specialty:  Internal Medicine Why:  Please call and make an appointment after your discharge from nursing facility. Contact information: [place] [address] [place] [facility] Course by problem list:   [name], 65 y. 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   Strep pneumonia bacteremia and pneumonia. She was found to have multifocal pneumonia on chest x-ray, later transferred to ICU because of worsening hypoxemia/acidosis despite BiPAP requiring intubation to protect airways. Later found to have strep pneumonia, with positive blood cultures for strep pneumonia. She was treated with  -vancomycin from October 22 to November 3. -Levaquin from October 22 to October 30. -Primaxin from October 31 to November 3 She was first extubated on October 29, and then reintubated the same day due to increased respiratory secretions. Eventually weaned off from vent and extubated on on November 2.  Her pneumonia resolved on subsequent chest x-rays, she completed her antibiotic course. Remained afebrile, with gradually improving cough during the rest of her stay.  Dysphagia and hoarse voice. She developed stridor and dysphagia because of prolonged intubation, she failed swallow study. Found to have ulceration of posterior cord with good mobility on the laryngeal scope. She refused NG tube. She was initially maintained on dextrose and half normal saline, next day was able to swallow pudding thick food. Her dysphagia and voice slowly improved to the point that on November 14, she was able to swallow thin liquids including water. She still needs speech therapy for her hoarseness.  Acute on chronic kidney disease.She became volume overload because of excessive fluid resuscitation due to shock, echo done on October 22 shows mild worsening of LVEF 40-45%(50-55% on 08/2012) with mild LDH and grade 1 diastolic dysfunction. She was then diuresed and lost 9 pounds over 24 hour. Net fluid loss of -5411. Her kidney function worsened initially. Later with gentle rehydration, her  electrolyte abnormalities and kidney function started to improve. In next few days she was reached to her baseline.  Hematuria and Atonic bladder. She developed hematuria likely due to urethral injury after prolonged catheterization. She also developed urinary retention after removal of Foley. Her urine analysis and culture just grew yeast. Her renal ultrasound initially showed some hydronephrosis CT was negative for any obstruction or hydronephrosis. She remains completely asymptomatic with overflow incontinence. Her post void bladder scan shows more than 800 mL of urine. As a result, she was recatheterized, she is being discharged to SNF with Foley in place. She needs to follow up with a urology. [facility] was contacted, they will call her husband with appointment. We recommend q4 days CBC to evaluate for blood loss while gross hematuria continues. We will plan to hold Xarelto until hematuria resolves and urethra heals, then recommend restarting for PE ppx.  Hypertension. She was on amlodipine 10 mg and a combination pill containing losartan 100 mg and hydrochlorothiazide 25 mg at home. On admission she was hypotensive, her antihypertensives were held. Later on with improvement in her blood pressure, when her blood pressure remained elevated, her home meds were slowly introduced. She was discharged on amlodipine 10 mg, losartan  50 mg and hydrochlorothiazide 25 mg. Her losartan can be uptitrated if needed.  Diabetes. She has well-controlled diabetes, last A1c was 6.2 on 07/06/2016. She was initially managed with sliding scale, later Lantus 5 units at bedtime was added. She has well-controlled CBGs on just 5 units of Lantus.  History of reoccurring PE. She was on Xarelto at home. She developed coagulopathy on Coumadin requiring vitamin K. She was initially on heparin infusion, later Lovenox was used. Xarelto held at DC d/t hematuria. Will recommend restarting when urethral injury  resolves.  Discharge Vitals:   BP (!) 153/68 (BP Location: Right Arm)   Pulse 89   Temp 99.7 F (37.6 C) (Oral)   Resp 20   Ht 5\' 9"  (1.753 m)   Wt 89.3 kg (196 lb 13.9 oz)   SpO2 99%   BMI 29.07 kg/m   Gen.well-built, well-nourished, in no acute distress. Lungs.Mostly clear except a few upper respiratory transmitted sounds. CV. Regular rate and rhythm. Abdomen. Soft, nontender, nondistended, bowel sounds positive. Extremities.No edema, no cyanosis, pulses 2+ bilaterally.  Pertinent Labs, Studies, and Procedures:  CBC Latest Ref Rng & Units 11/05/2016 11/04/2016 11/03/2016  WBC 4.0 - 10.5 K/uL 10.3 10.4 10.7(H)  Hemoglobin 12.0 - 15.0 g/dL 9.5(L) 10.1(L) 10.3(L)  Hematocrit 36.0 - 46.0 % 29.8(L) 31.8(L) 32.2(L)  Platelets 150 - 400 K/uL 329 350 364   BMP Latest Ref Rng & Units 11/05/2016 11/04/2016 11/03/2016  Glucose 65 - 99 mg/dL 101(H) 103(H) 103(H)  BUN 6 - 20 mg/dL 26(H) 29(H) 29(H)  Creatinine 0.44 - 1.00 mg/dL 2.23(H) 2.17(H) 2.19(H)  BUN/Creat Ratio 12 - 28 - - -  Sodium 135 - 145 mmol/L 137 134(L) 137  Potassium 3.5 - 5.1 mmol/L 4.6 4.6 4.4  Chloride 101 - 111 mmol/L 108 107 110  CO2 22 - 32 mmol/L 22 20(L) 19(L)  Calcium 8.9 - 10.3 mg/dL 8.2(L) 8.2(L) 8.3(L)   Urine antigen for strep pneumonia. Positive  Blood Culture (routine x 2)  Order: [id number]  Status:  Final result Visible to patient:  No (Not Released) Next appt:  None  Newer results are available. Click to view them now.   3wk ago  Specimen Description BLOOD LEFT ANTECUBITAL   Special Requests BOTTLES DRAWN AEROBIC AND ANAEROBIC 5CC   Culture Setup Time GRAM POSITIVE COCCI IN PAIRS  IN BOTH AEROBIC AND ANAEROBIC BOTTLES  CRITICAL RESULT CALLED TO, READ BACK BY AND VERIFIED WITH: L. CURRAN, PHARM AT [id number] ON [id number] BY S. YARBROUGH       Culture  (A)  STREPTOCOCCUS PNEUMONIAE SUSCEPTIBILITIES PERFORMED ON PREVIOUS CULTURE WITHIN THE LAST 5 DAYS.        Susceptibility    Streptococcus  pneumoniae    MIC    CEFTRIAXONE  Sensitive 1    ERYTHROMYCIN >=8 RESISTANT  Resistant    LEVOFLOXACIN 0.5 SENSITIVE  Sensitive    PENICILLIN  Sensitive 2         1 (Streptococcus pneumoniae/CEFTRIAXONE)   SENSITIVE <=0.12  2 (Streptococcus pneumoniae/PENICILLIN)   SENSITIVE     Urinalysis    Component Value Date/Time   COLORURINE RED (A) 11/02/2016 1624   APPEARANCEUR TURBID (A) 11/02/2016 1624   LABSPEC 1.014 11/02/2016 1624   PHURINE 5.5 11/02/2016 1624   GLUCOSEU 100 (A) 11/02/2016 1624   HGBUR LARGE (A) 11/02/2016 1624   BILIRUBINUR MODERATE (A) 11/02/2016 1624   KETONESUR 15 (A) 11/02/2016 1624   PROTEINUR 100 (A) 11/02/2016 1624   UROBILINOGEN 1.0 02/07/2015 1424   NITRITE  POSITIVE (A) 11/02/2016 1624   LEUKOCYTESUR MODERATE (A) 11/02/2016 1624   Urine microscopic-add on  Order: [id number]  Status:  Final result Visible to patient:  No (Not Released) Next appt:  None    Ref Range & Units 1d ago 3wk ago   Squamous Epithelial / LPF NONE SEEN 0-5   6-30     WBC, UA 0 - 5 WBC/hpf 6-30  0-5    RBC / HPF 0 - 5 RBC/hpf TOO NUMEROUS TO COUNT  0-5    Bacteria, UA NONE SEEN NONE SEEN  MANY     Urine-Other  YEAST PRESENT  AMORPHOUS URATES/PH...       Culture, Urine  Order: [id number]  Status:  Final result Visible to patient:  No (Not Released) Next appt:  None   2d ago  Specimen Description URINE, CLEAN CATCH   Special Requests NONE   Culture >=100,000 COLONIES/mL YEAST    Report Status 11/03/2016 FINAL       CXR ON Admission. FINDINGS: Dense RIGHT greater than LEFT alveolar airspace opacities. Cardiac silhouette is mildly enlarged and unchanged. Mildly calcified aortic knob. No pleural effusion. No pneumothorax. Severe RIGHT and moderate LEFT shoulder degenerative change.  IMPRESSION: Multifocal pneumonia. Followup PA and lateral chest X-ray is recommended in 3-4 weeks following trial of antibiotic therapy to ensure resolution and exclude  underlying malignancy.  Mild cardiomegaly.  CXR On Discharge. FINDINGS: There is a left central line again identified. The distal tip is slightly deflected superiorly, improved in the interval. The distal tip is likely in the mid SVC. No pneumothorax. The cardiomediastinal silhouette is stable. No pulmonary nodules, masses, or focal infiltrates.  IMPRESSION: Left central line as above. No pneumothorax. No acute abnormalities.  CT Chest. On Admission. IMPRESSION: Extensive bilateral airspace consolidative changes most compatible with multifocal pneumonia. Clinical correlation and follow-up resolution recommended.  Mild cardiomegaly with multi vessel coronary artery disease.  US Renal. FINDINGS: Right Kidney:  Length: 10.7 cm in length. Moderate hydronephrosis is present. There is dilatation of renal pelvis as well as calices. No focal mass.  Left Kidney:  Length: 11.3 cm. Echogenicity is normal. Small parapelvic cyst contains an internal septation but otherwise appears simple. Cysts has been stable since prior CT exams including 2013. No hydronephrosis or solid renal mass.  Bladder:  Appears normal for degree of bladder distention. Bilaterally ureteral jets are noted.  IMPRESSION: 1. Moderate right-sided hydronephrosis. 2. Consider further evaluation with CT of the abdomen and pelvis without contrast. 3. Stable left renal cyst.  CT Abdomen and Pelvis. FINDINGS: Lower chest: Subsegmental atelectasis LEFT lower lobe  Hepatobiliary: Gallbladder surgically absent. 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 No focal mass lesion.  Adrenals/Urinary Tract: Adrenal glands normal appearance. Vascular calcifications at kidneys bilaterally. No definite evidence of renal mass lesion or hydronephrosis. Ureters and bladder unremarkable.  Stomach/Bowel: Appendix surgically absent by history.  Retained contrast within colon from prior swallowing function study. Stomach and bowel loops otherwise unremarkable.  Vascular/Lymphatic: Extensive atherosclerotic calcifications without aneurysm. No adenopathy.  Reproductive: By history post hysterectomy. Ovaries not visualized.  Other: Tiny periumbilical hernia containing fat. No free air free fluid.  Musculoskeletal: Scattered degenerative disc disease changes thoracolumbar spine. Facet degenerative changes lower lumbar spine especially L5-S1. Central acquired spinal stenosis L4-L5, multifactorial. Suspected broad-based disc herniation L5-S1.  IMPRESSION: No acute intra-abdominal or intrapelvic abnormalities.  Aortic atherosclerosis.  Tiny periumbilical hernia containing fat.  Central acquired spinal stenosis L4-L5.  Suspected broad-based disc herniation L5-S1.  Discharge Instructions: Discharge Instructions    Diet - low sodium heart healthy    Complete by:  As directed    Diet - low sodium heart healthy    Complete by:  As directed    Discharge instructions    Complete by:  As directed    It was pleasure taking care of you. I am glad that you are improving. We contacted urologist for your bladder problem, they will call your husband with appointment. Please call the clinic when you are discharged from that facility to make a follow-up appointment.   Increase activity slowly    Complete by:  As directed    Increase activity slowly    Complete by:  As directed       Signed: Holley Raring, MD 11/05/2016, 11:30 AM   Pager: [phone]

## 2016-11-05 NOTE — Progress Notes (Signed)
Patient's husband stated he does not understand why patient is being discharged today when he was told she would be leaving Friday. CSW alerted MD and requested that MD call patient's spouse.  Percell Locus Geary Rufo LCSWA [phone]

## 2016-11-05 NOTE — Discharge Instructions (Signed)
It was pleasure taking care of you. I am glad that you are improving. We contacted urologist for your bladder problem, they will call your husband with appointment. Please call the clinic when you are discharged from that facility to make a follow-up appointment.  ------------ Information on my medicine - XARELTO (rivaroxaban)  This medication education was reviewed with me or my healthcare representative as part of my discharge preparation.    WHY WAS XARELTO PRESCRIBED FOR YOU? Xarelto was prescribed to treat blood clots that may have been found in the veins of your legs (deep vein thrombosis) or in your lungs (pulmonary embolism) and to reduce the risk of them occurring again.  What do you need to know about Xarelto? The dose is taken [place] with your evening meal.  DO NOT stop taking Xarelto without talking to the health care provider who prescribed the medication.  Refill your prescription for 20 mg tablets before you run out.  After discharge, you should have regular check-up appointments with your healthcare provider that is prescribing your Xarelto.  In the future your dose may need to be changed if your kidney function changes by a significant amount.  What do you do if you miss a dose? If you are taking Xarelto ONCE DAILY and you miss a dose, take it as soon as you remember on the same day then continue your regularly scheduled once daily regimen the next day. Do not take two doses of Xarelto at the same time.   Important Safety Information Xarelto is a blood thinner medicine that can cause bleeding. You should call your healthcare provider right away if you experience any of the following: ? Bleeding from an injury or your nose that does not stop. ? Unusual colored urine (red or dark brown) or unusual colored stools (red or black). ? Unusual bruising for unknown reasons. ? A serious fall or if you hit your head (even if there is no bleeding).  Some medicines may  interact with Xarelto and might increase your risk of bleeding while on Xarelto. To help avoid this, consult your healthcare provider or pharmacist prior to using any new prescription or non-prescription medications, including herbals, vitamins, non-steroidal anti-inflammatory drugs (NSAIDs) and supplements.  This website has more information on Xarelto: [email or web address]

## 2016-11-05 NOTE — Progress Notes (Signed)
Patient will DC to: [facility] Anticipated DC date: 11/05/16 Family notified: Spouse Transport by: Corey Harold   Per MD patient ready for DC to [facility]. RN, patient, patient's family, and facility notified of DC. Discharge Summary sent to facility. RN given number for report. DC packet on chart. Ambulance transport requested for patient.   CSW signing off.  Cedric Fishman, [place] Social Worker [phone]

## 2016-11-06 ENCOUNTER — Encounter: Payer: Self-pay | Admitting: Internal Medicine

## 2016-11-06 ENCOUNTER — Non-Acute Institutional Stay (SKILLED_NURSING_FACILITY): Payer: PRIVATE HEALTH INSURANCE | Admitting: Internal Medicine

## 2016-11-06 DIAGNOSIS — Z794 Long term (current) use of insulin: Secondary | ICD-10-CM | POA: Diagnosis not present

## 2016-11-06 DIAGNOSIS — N179 Acute kidney failure, unspecified: Secondary | ICD-10-CM | POA: Diagnosis not present

## 2016-11-06 DIAGNOSIS — Z86711 Personal history of pulmonary embolism: Secondary | ICD-10-CM

## 2016-11-06 DIAGNOSIS — R319 Hematuria, unspecified: Secondary | ICD-10-CM

## 2016-11-06 DIAGNOSIS — N312 Flaccid neuropathic bladder, not elsewhere classified: Secondary | ICD-10-CM | POA: Diagnosis not present

## 2016-11-06 DIAGNOSIS — I10 Essential (primary) hypertension: Secondary | ICD-10-CM | POA: Diagnosis not present

## 2016-11-06 DIAGNOSIS — A409 Streptococcal sepsis, unspecified: Secondary | ICD-10-CM | POA: Diagnosis not present

## 2016-11-06 DIAGNOSIS — N189 Chronic kidney disease, unspecified: Secondary | ICD-10-CM | POA: Diagnosis not present

## 2016-11-06 DIAGNOSIS — E118 Type 2 diabetes mellitus with unspecified complications: Secondary | ICD-10-CM | POA: Diagnosis not present

## 2016-11-06 LAB — GLUCOSE, CAPILLARY: Glucose-Capillary: 96 mg/dL (ref 65–99)

## 2016-11-06 NOTE — Patient Instructions (Signed)
[facility] will be consulted to assess the need for the indwelling Foley and the asymptomatic hematuria.

## 2016-11-06 NOTE — Assessment & Plan Note (Signed)
[facility] will be consult for follow-up as per discharge summary

## 2016-11-06 NOTE — Assessment & Plan Note (Signed)
07/05/16 baseline creatinine 2.23 She is on HCTZ 25 mg daily, this would not be expected to be efficacious creatinine at this level

## 2016-11-06 NOTE — Progress Notes (Signed)
This is a comprehensive admission note to [facility] performed on this date less than 30 days from date of admission. Included are preadmission medical/surgical history;reconciled medication list; family history; social history and comprehensive review of systems.  Corrections and additions to the records were documented . Comprehensive physical exam was also performed. Additionally a clinical summary was entered for each active diagnosis pertinent to this admission in the Problem List to enhance continuity of care.  [id number] Tiburcio Pea MD,[facility]  HPI: Patient was hospitalized 10/21-11/16/17 with bacteremia/sepsis in the context of community acquired strep pneumoniae associated with  ARDS & Acute on CRF . Chest x-ray revealed multifocal pneumonia. Worsening hypoxemia/acidosis despite BiPAPnecessitated transfer to ICU and intubated. Blood cultures were positive for strep pneumoniae. Urine antigen for strep pneumonia was positive also. Antibiotic coverage including vancomycin, Levaquin, and Primaxin. Initially she was extubated 10/29 but required reintubation that same day due to increase in secretions. She was finally extubated 11/2. The prolonged intubation was associated with stridor & dysphagia. 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 As of 11/14 she was able to swallow thin liquids. Speech therapy was to continue to follow her for her hoarseness. The fluid resuscitation was exacerbated her grade 1 diastolic dysfunction. Echo revealed ejection fraction of 40-45 percent with mild LVH  She did have exacerbation of  renal insufficiency ,but this  returned to baseline. 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 [facility] has been consulted. Urine culture grew yeast. Ultra sound suggested hydronephrosis but CT revealed no obstruction. Although she was asymptomatic she had overflow incontinence necessitating discharge to SNF with Foley. Blood pressure medicines initially were held because of hypotension the time of admission. At the time of discharge losartan was kept @ 50 mg. DM was thought to be well controlled, A1c was 6.2% on 07/06/16. She received sliding scale but was transitioned to Lantus 5 units at bedtime. She had been on Xarelto at home but was transitioned to warfarin. A supranormal PT/INR required vitamin K. At discharge Lovenox was changed back to Xarelto. The dose is only 15 mg once daily because of CRF. At discharge creatinine was 2.23. White count was 10,400. She had a mild anemia with hematocrit 31.8.    Past medical and surgical history:Patient is on chronic anticoagulation due to history of recurrent PE. Also medical diagnoses include type 2 diabetes, peripheral neuropathy , renal calculi, hypertension, dyslipidemia, depression, chronic daily headache, and history of pulmonary embolus and splenic infarct. Surgeries include hysterectomy, temporal artery biopsy, and cholecystectomy.  Social history: She does not drink. She has been a 1/4 pack per day smoker  Family history:Patient is adopted, history is limited. Mother had hypertension.  Review of systems: The veracity of her responses is somewhat in question. The date was given as October and she could not name the president. She initially could not remember the name of her primary care physician. She denies any symptoms except for weakness. She denies any active GU symptoms even though the urine is pink in the Foley catheter. Constitutional: No fever,significant weight change  Eyes: No redness, discharge, pain, vision change ENT/mouth: No nasal congestion,  purulent discharge, earache,change in hearing ,sore throat  Cardiovascular: No  chest pain, palpitations,paroxysmal nocturnal dyspnea, claudication, edema  Respiratory: No cough, sputum production,hemoptysis, DOE , significant snoring,apnea  Gastrointestinal: No heartburn,dysphagia,abdominal pain, nausea / vomiting,rectal bleeding, melena,change in bowels Genitourinary: No dysuria,hematuria, pyuria,  incontinence, nocturia Musculoskeletal: No joint stiffness, joint swelling, weakness,pain Dermatologic: No rash, pruritus, change in appearance of skin Neurologic: No dizziness,headache,syncope, seizures, numbness , tingling Psychiatric: No significant anxiety , depression, insomnia, anorexia Endocrine: No change in hair/skin/ nails, excessive thirst, excessive hunger, excessive urination  Hematologic/lymphatic: No significant bruising, lymphadenopathy,abnormal bleeding Allergy/immunology: No itchy/ watery eyes, significant sneezing, urticaria, angioedema  Physical exam:  Pertinent or positive findings: she is very hoarse. There is stridorous quality to her respirations to a slight degree.  She is edentulous. Breath sounds are decreased. S4 gallop type rhythm is present. Pedal pulses are decreased. Facial wasting as well as significant limb muscular wasting is present.   there is a hyperpigmented, bland lesion over the mid back with subcutaneous tissue deficit.  General appearance:Adequately nourished; no acute distress , increased work of breathing is present.   Lymphatic: No lymphadenopathy about the head, neck, axilla . Eyes: No conjunctival inflammation or lid edema is present. There is no scleral icterus. Ears:  External ear exam shows no significant lesions or deformities.   Nose:  External nasal examination shows no deformity or inflammation. Nasal mucosa are pink and moist without lesions ,exudates Oral exam: lips and gums are healthy appearing.There is no oropharyngeal erythema or exudate . Neck:  No thyromegaly, masses, tenderness noted.    Heart:  Normal rate and  regular rhythm. S1 and S2 normal without  murmur, click, rub .  Lungs:Chest clear to auscultation without wheezes, rhonchi,rales , rubs. Abdomen:Bowel sounds are normal. Abdomen is soft and nontender with no organomegaly, hernias,masses. GU: deferred Foley in place; urine pink. Extremities:  No cyanosis, clubbing,edema  Neurologic exam : Strength decreased  in upper & lower extremities Balance,Rhomberg,finger to nose testing could not be completed due to clinical state Skin: Warm & dry w/o tenting. No significant lesions or rash.  See clinical summary under each active problem in the Problem List with associated updated therapeutic plan

## 2016-11-06 NOTE — Assessment & Plan Note (Signed)
7/17 A1c 6.2%, prediabetic value Glucoses will be monitored on the low-dose basal insulin Repeat A1c

## 2016-11-06 NOTE — Assessment & Plan Note (Signed)
Continue low dose Xarelto

## 2016-11-06 NOTE — Assessment & Plan Note (Signed)
Because of elevated creatinine, discontinue HCTZ and adjust ARB & calcium channel blocker

## 2016-11-06 NOTE — Assessment & Plan Note (Signed)
[facility] will reassess as outpatient

## 2016-11-06 NOTE — Assessment & Plan Note (Signed)
Other than slight stridor, no evidence of any respiratory compromise at this time

## 2016-11-09 LAB — BASIC METABOLIC PANEL
BUN: 28 mg/dL — AB (ref 4–21)
CREATININE: 2.5 mg/dL — AB (ref 0.5–1.1)
Glucose: 156 mg/dL
POTASSIUM: 4.9 mmol/L (ref 3.4–5.3)
SODIUM: 135 mmol/L — AB (ref 137–147)

## 2016-11-16 LAB — BASIC METABOLIC PANEL
BUN: 28 mg/dL — AB (ref 4–21)
Creatinine: 2.5 mg/dL — AB (ref 0.5–1.1)
GLUCOSE: 88 mg/dL
Potassium: 4 mmol/L (ref 3.4–5.3)
SODIUM: 137 mmol/L (ref 137–147)

## 2016-11-17 ENCOUNTER — Encounter: Payer: Self-pay | Admitting: Internal Medicine

## 2016-11-17 ENCOUNTER — Non-Acute Institutional Stay (SKILLED_NURSING_FACILITY): Payer: PRIVATE HEALTH INSURANCE | Admitting: Internal Medicine

## 2016-11-17 DIAGNOSIS — Z818 Family history of other mental and behavioral disorders: Secondary | ICD-10-CM | POA: Diagnosis not present

## 2016-11-17 DIAGNOSIS — I10 Essential (primary) hypertension: Secondary | ICD-10-CM | POA: Diagnosis not present

## 2016-11-17 DIAGNOSIS — R6520 Severe sepsis without septic shock: Secondary | ICD-10-CM | POA: Diagnosis not present

## 2016-11-17 DIAGNOSIS — N183 Chronic kidney disease, stage 3 unspecified: Secondary | ICD-10-CM

## 2016-11-17 DIAGNOSIS — E118 Type 2 diabetes mellitus with unspecified complications: Secondary | ICD-10-CM | POA: Diagnosis not present

## 2016-11-17 DIAGNOSIS — R061 Stridor: Secondary | ICD-10-CM

## 2016-11-17 DIAGNOSIS — Z794 Long term (current) use of insulin: Secondary | ICD-10-CM | POA: Diagnosis not present

## 2016-11-17 DIAGNOSIS — A419 Sepsis, unspecified organism: Secondary | ICD-10-CM | POA: Diagnosis not present

## 2016-11-17 NOTE — Assessment & Plan Note (Addendum)
Adequate control, but progressive rise in creatinine and decrease in GFR necessitates discontinuing ARB  Metroprolol will be initiated

## 2016-11-17 NOTE — Assessment & Plan Note (Signed)
Discuss with administration at [facility]

## 2016-11-17 NOTE — Assessment & Plan Note (Signed)
Clinically resolved, residual renal insufficiency in the context of hypertension

## 2016-11-17 NOTE — Assessment & Plan Note (Signed)
Clinically resolved at this time, ongoing monitor to assess aspiration risk

## 2016-11-17 NOTE — Patient Instructions (Signed)
See Current Assessment & Plan in Problem List under specific Diagnosis 

## 2016-11-17 NOTE — Assessment & Plan Note (Signed)
Current creatinine 2.53 and GFR 20.1, losartan contraindicated with GFR less than 30 Change to metoprolol and monitor blood pressure
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
## 2016-11-17 NOTE — Assessment & Plan Note (Signed)
Glucose well controlled on low-dose Lantus Metformin not an option in the context of renal failure

## 2016-11-20 ENCOUNTER — Encounter: Payer: Medicare Other | Admitting: Internal Medicine

## 2016-11-24 ENCOUNTER — Other Ambulatory Visit: Payer: Self-pay | Admitting: Internal Medicine

## 2016-11-24 ENCOUNTER — Non-Acute Institutional Stay (SKILLED_NURSING_FACILITY): Payer: PRIVATE HEALTH INSURANCE | Admitting: Internal Medicine

## 2016-11-24 ENCOUNTER — Encounter: Payer: Self-pay | Admitting: Internal Medicine

## 2016-11-24 DIAGNOSIS — E118 Type 2 diabetes mellitus with unspecified complications: Secondary | ICD-10-CM | POA: Diagnosis not present

## 2016-11-24 DIAGNOSIS — Z794 Long term (current) use of insulin: Secondary | ICD-10-CM

## 2016-11-24 DIAGNOSIS — J189 Pneumonia, unspecified organism: Secondary | ICD-10-CM

## 2016-11-24 DIAGNOSIS — Z818 Family history of other mental and behavioral disorders: Secondary | ICD-10-CM
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
## 2016-11-24 NOTE — Assessment & Plan Note (Signed)
The low-dose insulin will be discontinued until reevaluated by her PCP. She was instructed to read all labels &  consume LESS than 30 grams of sugar / day from foods & drinks with  High Fructose Corn Syrup as #1, 2 , 3 or # 4 on label. (Note : dividing the "grams of sugar" on label by 4 gives "teaspoons of sugar" content of food or drink. For example as we discussed ,the 12 ounce [facility] on your bedside table has 46  grams of sugar or 11.5 tsp of sugar).

## 2016-11-24 NOTE — Patient Instructions (Signed)
See Current Assessment & Plan in Problem List under specific Diagnosis Please follow-up with Dr. Tiburcio Pea Parth,MD in 7-10 days. Call [facility] if you're unable to pass urine after they removed the Foley catheter today.

## 2016-11-24 NOTE — Assessment & Plan Note (Addendum)
11/24/16 No active respiratory compromise. Minor rales on exam.  She will be discharged on albuterol 1-2 puffs every 6 hours as needed. The in inhaled steroid will be held until reevaluation by her PCP.

## 2016-11-24 NOTE — Progress Notes (Signed)
Facility Location: [facility]  Room Number: 211  Code Status:   PCP: [name], MD [place] [id number]    The patient is being discharged from [facility] on this date by Unice Cobble MD.  The medical history in this facility was reviewed and summarized and medical problem list was updated. Time spent and note content is documented as follows.  Summary of [place] medical records: Please see dictation 11/17/16. Patient was to have been discharged from SNF that day, but the patient remained in the facility as there was  question of her husband being able to handle her care needs at home. That day she was unable to give the correct date or name the president. Additionally it was recommended that she stay until  she could be seen by urology and indwelling Foley removed.  That 11/28 note summarizes her complicated hospital course which included community-acquired strep pneumoniae and sepsis, with ARDS and acute on chronic renal failure. She required mechanical ventilation and failed extubation 10/29. She was finally extubated 11/12. She had associated ulceration of posterior cord associated with some stridor and dysphagia. At the time of discharge these were not active problems. Speech therapy was advancing her diet and discussed recommendations with her husband.  She had hematuria in the setting of prolonged Foley catheterization for atonic bladder and urinary retention. The patient was seen earlier today by [facility] and the Foley catheter removed. Should she have urinary retention she will need to go to the emergency room as the patient is adamant about leaving today. She had been on losartan but this was discontinued because of GFR less than 30. Blood pressure remained well controlled and was 124/70 the day discharge. Glucoses were controlled on very low doses of basal insulin. The range of glucoses  in  Epic  was 101-156. At the SNF glucoses were not elevated, the value was 90 fasting today. The last A1c on record was prediabetic at 6.2% on 07/06/16.    Review of systems: PT/OT feels that the patient continues to progress and would benefit from more services. They state these could be completed in the home environment. The major issue she has at this time is getting off the low toilet. The patient denies any active cardiopulmonary symptoms. Patient's husband is adamant that he does not want to take her home until she is able to void on her own without the Foley catheter. Urology recommended a four hour trial assessment before considering reinserting the catheter. She denies any abdominal discomfort or GU symptoms. In reviewing them medications for discharge it became apparent that she had not been taking the oral anticoagulant Xarelto prior to hospital admission, but she had does have some pills left. She had stopped the medication on her own. She has a history of recurrent pulmonary emboli. She is unable to define blood glucoses at home, but states she was on insulin. Since her hospitalization she's not been smoking. The risks of restarting were discussed with her.  Physical exam:  Pertinent or positive findings:The patient is markedly alert today and expresses a desire to go home. She is oriented to date and the name of the president. This was not true 11/28 when discharge was deferred.  Breath sounds are decreased. She has minimal isolated rhonchi with no increased work of breathing. Pedal pulses are decreased.  General appearance:Adequately nourished; no acute distress , increased work of breathing is present.   Lymphatic: No  lymphadenopathy about the head, neck, axilla . Eyes: No conjunctival inflammation or lid edema is present. There is no scleral icterus. Nose:  External nasal examination shows no deformity or inflammation. Nasal mucosa are pink and moist without lesions ,exudates Oral  exam: lips and gums are healthy appearing.Edentulous Neck:  No thyromegaly, masses, tenderness noted.    Heart:  Normal rate and regular rhythm. S1 and S2 normal without gallop, murmur, click, rub .  Abdomen:Bowel sounds are normal. Abdomen is soft and nontender with no organomegaly, hernias,masses. GU: deferred as previously addressed. Extremities:  No cyanosis, clubbing,edema  Neurologic exam : Strength equal  in upper & lower extremities Balance,Rhomberg,finger to nose testing could not be completed due to clinical state Skin: Warm & dry w/o tenting. No significant lesions or rash.  See clinical summary of Discharge Diagnoses in the Problem List with associated updated therapeutic plan  Discharge instructions were written and discharge instructions provided. Follow-up will be by the primary care physician in seven - 10 days.

## 2016-11-25 ENCOUNTER — Other Ambulatory Visit: Payer: Self-pay | Admitting: Internal Medicine

## 2016-11-25 NOTE — Telephone Encounter (Signed)
11/25/16 One-month prescription given for both metoprolol and furosemide until she sees her PCP

## 2016-11-30 ENCOUNTER — Telehealth: Payer: Self-pay | Admitting: *Deleted

## 2016-11-30 NOTE — Telephone Encounter (Signed)
[facility]- [place] calls for VO for HHCSW, given VO, do you agree

## 2016-12-01 ENCOUNTER — Encounter: Payer: Self-pay | Admitting: Internal Medicine

## 2016-12-01 ENCOUNTER — Ambulatory Visit (INDEPENDENT_AMBULATORY_CARE_PROVIDER_SITE_OTHER): Payer: Medicare Other | Admitting: Internal Medicine

## 2016-12-01 VITALS — BP 118/69 | HR 89 | Temp 97.9°F | Ht 69.0 in | Wt 208.8 lb

## 2016-12-01 DIAGNOSIS — Z87448 Personal history of other diseases of urinary system: Secondary | ICD-10-CM

## 2016-12-01 DIAGNOSIS — E118 Type 2 diabetes mellitus with unspecified complications: Secondary | ICD-10-CM

## 2016-12-01 DIAGNOSIS — I2699 Other pulmonary embolism without acute cor pulmonale: Secondary | ICD-10-CM

## 2016-12-01 DIAGNOSIS — Z8701 Personal history of pneumonia (recurrent): Secondary | ICD-10-CM | POA: Diagnosis not present

## 2016-12-01 DIAGNOSIS — I110 Hypertensive heart disease with heart failure: Secondary | ICD-10-CM

## 2016-12-01 DIAGNOSIS — Z0000 Encounter for general adult medical examination without abnormal findings: Secondary | ICD-10-CM

## 2016-12-01 DIAGNOSIS — I5020 Unspecified systolic (congestive) heart failure: Secondary | ICD-10-CM

## 2016-12-01 DIAGNOSIS — I10 Essential (primary) hypertension: Secondary | ICD-10-CM

## 2016-12-01 DIAGNOSIS — R319 Hematuria, unspecified: Secondary | ICD-10-CM

## 2016-12-01 DIAGNOSIS — F17210 Nicotine dependence, cigarettes, uncomplicated: Secondary | ICD-10-CM

## 2016-12-01 DIAGNOSIS — Z7901 Long term (current) use of anticoagulants: Secondary | ICD-10-CM

## 2016-12-01 DIAGNOSIS — Z09 Encounter for follow-up examination after completed treatment for conditions other than malignant neoplasm: Secondary | ICD-10-CM

## 2016-12-01 DIAGNOSIS — Z79899 Other long term (current) drug therapy: Secondary | ICD-10-CM

## 2016-12-01 DIAGNOSIS — R5381 Other malaise: Secondary | ICD-10-CM | POA: Diagnosis not present

## 2016-12-01 DIAGNOSIS — R32 Unspecified urinary incontinence: Secondary | ICD-10-CM

## 2016-12-01 DIAGNOSIS — Z23 Encounter for immunization: Secondary | ICD-10-CM

## 2016-12-01 DIAGNOSIS — E119 Type 2 diabetes mellitus without complications: Secondary | ICD-10-CM

## 2016-12-01 DIAGNOSIS — Z794 Long term (current) use of insulin: Principal | ICD-10-CM

## 2016-12-01 DIAGNOSIS — J189 Pneumonia, unspecified organism: Secondary | ICD-10-CM

## 2016-12-01 LAB — GLUCOSE, CAPILLARY: GLUCOSE-CAPILLARY: 114 mg/dL — AB (ref 65–99)

## 2016-12-01 LAB — POCT GLYCOSYLATED HEMOGLOBIN (HGB A1C): HEMOGLOBIN A1C: 6.2

## 2016-12-01 MED ORDER — PRAVASTATIN SODIUM 40 MG PO TABS: 40.0000 mg | ORAL_TABLET | Freq: Every evening | ORAL | 0 refills | Status: AC

## 2016-12-01 MED ORDER — FUROSEMIDE 20 MG PO TABS: 20.0000 mg | ORAL_TABLET | Freq: Every day | ORAL | 0 refills | Status: AC | PRN

## 2016-12-01 MED ORDER — AMLODIPINE BESYLATE 10 MG PO TABS: 10.0000 mg | ORAL_TABLET | Freq: Every day | ORAL | 3 refills | Status: AC

## 2016-12-01 MED ORDER — METOPROLOL TARTRATE 25 MG PO TABS: 25.0000 mg | ORAL_TABLET | Freq: Two times a day (BID) | ORAL | 0 refills | Status: AC

## 2016-12-01 MED ORDER — RIVAROXABAN 15 MG PO TABS: 15.0000 mg | ORAL_TABLET | Freq: Every day | ORAL | 3 refills | Status: DC

## 2016-12-01 NOTE — Patient Instructions (Addendum)
It was a pleasure to meet you [name].  I am glad you are feeling better.  I will refill your medications and order the albuterol nebulizer machine as well as the pampers.  Please continue your medications as prescribed. You can take the Furosemide (water pill) as needed for increased swelling, weight gain, or shortness of breath.  Your diabetes is well-controlled. We will hold off on restarting insulin as we do not want to drop your blood sugars.  Please continue to work with Physical Therapy to help gain your strength back.  You can take Extra Strength Tylenol for your right shoulder pain. Do not take more than 3000 mg in one day.  Please schedule a follow up appointment with your regular doctor in 3 months or sooner if needed.   Rivaroxaban oral tablets What is this medicine? RIVAROXABAN (ri va ROX a ban) is an anticoagulant (blood thinner). It is used to treat blood clots in the lungs or in the veins. It is also used after knee or hip surgeries to prevent blood clots. It is also used to lower the chance of stroke in people with a medical condition called atrial fibrillation. COMMON BRAND NAME(S): Xarelto, Xarelto Starter Pack What should I tell my health care provider before I take this medicine? They need to know if you have any of these conditions: -bleeding disorders -bleeding in the brain -blood in your stools (black or tarry stools) or if you have blood in your vomit -history of stomach bleeding -kidney disease -liver disease -low blood counts, like low white cell, platelet, or red cell counts -recent or planned spinal or epidural procedure -take medicines that treat or prevent blood clots -an unusual or allergic reaction to rivaroxaban, other medicines, foods, dyes, or preservatives -pregnant or trying to get pregnant -breast-feeding How should I use this medicine? Take this medicine by mouth with a glass of water. Follow the directions on the prescription label. Take  your medicine at regular intervals. Do not take it more often than directed. Do not stop taking except on your doctor's advice. Stopping this medicine may increase your risk of a blood clot. Be sure to refill your prescription before you run out of medicine. If you are taking this medicine after hip or knee replacement surgery, take it with or without food. If you are taking this medicine for atrial fibrillation, take it with your evening meal. If you are taking this medicine to treat blood clots, take it with food at the same time each day. If you are unable to swallow your tablet, you may crush the tablet and mix it in applesauce. Then, immediately eat the applesauce. You should eat more food right after you eat the applesauce containing the crushed tablet. Talk to your pediatrician regarding the use of this medicine in children. Special care may be needed. What if I miss a dose? If you take your medicine once a day and miss a dose, take the missed dose as soon as you remember. If you take your medicine twice a day and miss a dose, take the missed dose immediately. In this instance, 2 tablets may be taken at the same time. The next day you should take 1 tablet twice a day as directed. What may interact with this medicine? Do not take this medicine with any of the following medications: -defibrotideThis medicine may also interact with the following medications: -aspirin and aspirin-like medicines -certain antibiotics like erythromycin, azithromycin, and clarithromycin -certain medicines for fungal infections like ketoconazole  and itraconazole -certain medicines for irregular heart beat like amiodarone, quinidine, dronedarone -certain medicines for seizures like carbamazepine, phenytoin -certain medicines that treat or prevent blood clots like warfarin, enoxaparin, and dalteparin -conivaptan -diltiazem -felodipine -indinavir -lopinavir; ritonavir -NSAIDS, medicines for pain and inflammation, like  ibuprofen or naproxen -ranolazine -rifampin -ritonavir -SNRIs, medicines for depression, like desvenlafaxine, duloxetine, levomilnacipran, venlafaxine -SSRIs, medicines for depression, like citalopram, escitalopram, fluoxetine, fluvoxamine, paroxetine, sertraline -St. John's wort -verapamil What should I watch for while using this medicine? Visit your doctor or health care professional for regular checks on your progress. Your condition will be monitored carefully while you are receiving this medicine. Notify your doctor or health care professional and seek emergency treatment if you develop breathing problems; changes in vision; chest pain; severe, sudden headache; pain, swelling, warmth in the leg; trouble speaking; sudden numbness or weakness of the face, arm, or leg. These can be signs that your condition has gotten worse. If you are going to have surgery, tell your doctor or health care professional that you are taking this medicine. Tell your health care professional that you use this medicine before you have a spinal or epidural procedure. Sometimes people who take this medicine have bleeding problems around the spine when they have a spinal or epidural procedure. This bleeding is very rare. If you have a spinal or epidural procedure while on this medicine, call your health care professional immediately if you have back pain, numbness or tingling (especially in your legs and feet), muscle weakness, paralysis, or loss of bladder or bowel control. Avoid sports and activities that might cause injury while you are using this medicine. Severe falls or injuries can cause unseen bleeding. Be careful when using sharp tools or knives. Consider using an Copy. Take special care brushing or flossing your teeth. Report any injuries, bruising, or red spots on the skin to your doctor or health care professional. What side effects may I notice from receiving this medicine? Side effects that you  should report to your doctor or health care professional as soon as possible: -allergic reactions like skin rash, itching or hives, swelling of the face, lips, or tongue -back pain -redness, blistering, peeling or loosening of the skin, including inside the mouth -signs and symptoms of bleeding such as bloody or black, tarry stools; red or dark-brown urine; spitting up blood or brown material that looks like coffee grounds; red spots on the skin; unusual bruising or bleeding from the eye, gums, or nose Side effects that usually do not require medical attention (report to your doctor or health care professional if they continue or are bothersome): -dizziness -muscle pain Where should I keep my medicine? Keep out of the reach of children. Store at room temperature between 15 and 30 degrees C (59 and 86 degrees F). Throw away any unused medicine after the expiration date.  2017 Elsevier/Gold Standard (2016-01-09 11:19:11)

## 2016-12-01 NOTE — Progress Notes (Signed)
CC:  CAP, T2DM  HPI:  [name] is a 65 y.o. female with PMH as listed below who presents for follow up after hospital and nursing home admission for invasive strep pneumonia requiring mechanical ventilation. She is also here for follow up management of her T2DM, HTN, and Recurrent PE. She is accompanied by her husband who provides additional history.  Strep Pneumonia bacteremia and CAP: Patient admitted from 10/10/16-11/05/16 for invasive strep pneumonia requiring mechanical ventilation. 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 She was discharged to [facility] where she stayed from 11/06/16-11/24/16. She progressed well with regards to her respiratory issues and was discharged in stable condition. She feels like her breathing is gradually improving with occasional shortness of breath, otherwise feels much better. She is afebrile and satting 100% on room air.  Deconditioning: Patient with deconditioning from prolonged hospital stay including ICU admission with mechanical ventilation. She has PT beginning work with her twice a week for the next 5 weeks. She has a wheelchair, rolling walker, and cane at home.  HTN: Currently takes Amlodipine 10 mg daily, Lopressor 25 mg BID, and Lasix 20 mg daily. She was previously on Losartan and HCTZ which seem to have been substituted with Lopressor and Lasix due to renal function and reduced EF (40-45% by TTE 10/12/16 in setting of acute illness). BP is 118/69 this visit.   T2DM: Last Hgb A1c was 6.2 in July 2017. She was on SSI and Lantus 5 units at bedtime. Insulin was discontinued in the nursing facility due to hypoglycemic episodes with otherwise well controlled blood sugars.  Reduced EF: TTE 10/12/16 showed an EF of 40-45% in the setting of acute illness. She was started on Metoprolol 25 mg BID and Lasix 20 mg  daily. She denies any orthopnea, PND, edema, or weight gain. She says the Lasix is making her urinate constantly which has been bothersome.  Hematuria: From urethral injury after prolonged catheterization during recent hospital admission. Hematuria resolved in the nursing facility and Urology saw patient on 11/24/16 at which time they removed the foley catheter. She denies any observable hematuria. She reports good urine output with increased frequency while on lasix.   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 This was to be held on discharge from hospital admission in setting of hematuria, however it was continued. She was taking this in the nursing home, but has not had it at home since discharge 1 week ago. She denies any further hematuria. She denies any hematemesis, hemoptysis, hematochezia, melena, epistaxis, or any other obvious sign of bleeding.  Health Maintenance: Patient given flu shot this visit.  Past Medical History:  Diagnosis Date  . Arthritis    "knees" (07/26/2014)  . Cataract of both eyes   . Chronic back pain   . Daily headache   . Depression   . High cholesterol   . Hypertension   . Hypertensive emergency 12/03/2015  . Kidney stones   . Migraine    "last one was in the 1990's" (07/26/2014)  . Neuropathy (HCC)    feet  . Pneumonia    "several times" (07/26/2014)  . Pulmonary embolism ([place])    2011 treated at [facility] in [place].  Was on Coumadin for over a year.  no known family history  . 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 Type II diabetes mellitus ([place])  dx'd 2000  . UTI (urinary tract infection)     Review of Systems:   Review of Systems  Constitutional: Negative for chills and fever.  HENT: Negative for nosebleeds.   Respiratory: Negative for cough and hemoptysis.        Occasional shortness of breath  Cardiovascular: Negative for chest pain, orthopnea, leg swelling and PND.  Gastrointestinal: Negative for blood in stool  and melena.  Genitourinary: Positive for frequency. Negative for dysuria and hematuria.  Musculoskeletal: Positive for joint pain. Negative for falls.  Neurological: Positive for weakness. Negative for focal weakness and loss of consciousness.     Physical Exam:  Vitals:   12/01/16 1423  BP: 118/69  Pulse: 89  Temp: 97.9 F (36.6 C)  TempSrc: Oral  SpO2: 100%  Weight: 208 lb 12.8 oz (94.7 kg)  Height: 5\' 9"  (1.753 m)   Physical Exam  Constitutional: She is oriented to person, place, and time. She appears well-developed. No distress.  Pleasant woman, resting comfortably in a wheelchair  Cardiovascular: Normal rate and regular rhythm.   Pulmonary/Chest: Effort normal. No respiratory distress. She has no wheezes.  Mild scattered rhonchi  Musculoskeletal: She exhibits no edema.  Neurological: She is alert and oriented to person, place, and time.  Skin: Skin is warm. She is not diaphoretic.    Assessment & Plan:   See Encounters Tab for problem based charting.  Patient discussed with Dr. Evette Doffing

## 2016-12-01 NOTE — Telephone Encounter (Signed)
Agreed thx 

## 2016-12-02 ENCOUNTER — Telehealth: Payer: Self-pay

## 2016-12-02 NOTE — Assessment & Plan Note (Signed)
Patient with deconditioning from prolonged hospital stay including ICU admission with mechanical ventilation. She has PT beginning work with her twice a week for the next 5 weeks. She has a wheelchair, rolling walker, and cane at home. -Continue PT -Patient counseled on using assistive devices and for fall precautions

## 2016-12-02 NOTE — Assessment & Plan Note (Signed)
Patient admitted from 10/10/16-11/05/16 for invasive strep pneumonia requiring mechanical ventilation. 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 She was discharged to [facility] where she stayed from 11/06/16-11/24/16. She progressed well with regards to her respiratory issues and was discharged in stable condition. She feels like her breathing is gradually improving with occasional shortness of breath, otherwise feels much better. She is afebrile and satting 100% on room air.  Patient much improved and her invasive strep pneumonia infection appears to be resolved. She has completed her antibiotic courses. She has albuterol at home but needs the nebulizer machine to use as needed which I have ordered.

## 2016-12-02 NOTE — Assessment & Plan Note (Signed)
Patient given flu shot this visit.

## 2016-12-02 NOTE — Assessment & Plan Note (Signed)
TTE 10/12/16 showed an EF of 40-45% in the setting of acute illness. She was started on Metoprolol 25 mg BID and Lasix 20 mg daily. She denies any orthopnea, PND, edema, or weight gain. She says the Lasix is making her urinate constantly which has been bothersome.  She does not report or exhibit signs or symptoms of heart failure during this visit. Reduced EF is in the setting of acute illness. Would consider repeat TTE in 6 months. Her frequent urination with lasix use is particularly bothersome. Will change lasix to as needed for signs symptoms of volume overload for which I have counseled her and her husband. -Continue Metoprolol 25 mg BID -Change Lasix 20 mg to as needed -Consider repeat TTE in 6 months -Not on ACE-I/ARB due to renal function

## 2016-12-02 NOTE — Progress Notes (Signed)
Internal Medicine Clinic Attending  Case discussed with Dr. Patel,Vishal at the time of the visit.  We reviewed the resident's history and exam and pertinent patient test results.  I agree with the assessment, diagnosis, and plan of care documented in the resident's note.  

## 2016-12-02 NOTE — Assessment & Plan Note (Signed)
Currently takes Amlodipine 10 mg daily, Lopressor 25 mg BID, and Lasix 20 mg daily. She was previously on Losartan and HCTZ which seem to have been substituted with Lopressor and Lasix due to renal function and reduced EF (40-45% by TTE 10/12/16 in setting of acute illness). BP is 118/69 this visit.  BP is well controlled. Refills sent to pharmacy. -Continue Amlodipine 10 mg daily -Continue Lopressor 25 mg BID

## 2016-12-02 NOTE — Assessment & Plan Note (Signed)
From urethral injury after prolonged catheterization during recent hospital admission. Hematuria resolved in the nursing facility and Urology saw patient on 11/24/16 at which time they removed the foley catheter. She denies any observable hematuria. She reports good urine output with increased frequency while on lasix. -Continue to monitor, follow up with Urology as needed

## 2016-12-02 NOTE — Assessment & Plan Note (Signed)
Has been on Xarelto 15 mg daily for recurrent PE. This was to be held on discharge from hospital admission in setting of hematuria, however it was continued. She was taking this in the nursing home, but has not had it at home since discharge 1 week ago. She denies any further hematuria. She denies any hematemesis, hemoptysis, hematochezia, melena, epistaxis, or any other obvious sign of bleeding.  Will resume Xarelto at this time given her history of recurrent PEs and resolved hematuria. Prescription refilled. Patient and husband counseled on risk for bleeding.

## 2016-12-02 NOTE — Assessment & Plan Note (Signed)
Last Hgb A1c was 6.2 in July 2017. She was on SSI and Lantus 5 units at bedtime. Insulin was discontinued in the nursing facility due to hypoglycemic episodes with otherwise well controlled blood sugars.  Repeat A1c this visit is 6.2. Recommend continued management off of low-dose insulin due to concern for hypoglycemic episodes.

## 2016-12-02 NOTE — Telephone Encounter (Signed)
Joy Butler from [facility] needs to speak with a nurse regarding meds. Please call back.

## 2016-12-03 ENCOUNTER — Telehealth: Payer: Self-pay

## 2016-12-03 ENCOUNTER — Other Ambulatory Visit: Payer: Self-pay | Admitting: Internal Medicine

## 2016-12-03 MED ORDER — DICLOFENAC SODIUM 1 % TD GEL: 4.0000 g | Freq: Four times a day (QID) | TRANSDERMAL | 2 refills | Status: AC

## 2016-12-03 NOTE — Telephone Encounter (Signed)
Pt husband needs to speak with a nurse regarding pain med. Please call back.

## 2016-12-03 NOTE — Telephone Encounter (Signed)
HHPT calls and states pt forgot to mention to md that she is having shoulder pain, went thru visit note read her instructions to use ex str tylenol for shoulder pain, PT states that pt says ex str tylenol doesn't work, offered appt, pt will call if she decides she needs to

## 2016-12-03 NOTE — Telephone Encounter (Signed)
Called and gave husband instructions, ask him for pt to give it at least 5 days of the ex str tylenol and gel then if pt is continuing to experience great pain to call for an appt, he was agreeable

## 2016-12-03 NOTE — Telephone Encounter (Signed)
Pt's husband calls and states the ex str tylenol is not working, he states she needs pain medicine

## 2016-12-03 NOTE — Telephone Encounter (Signed)
Please advise patient and/or husband that she can use Extra Strength Tylenol 1000 mg (two 500 mg pills) every 8 hours as needed for pain, not to exceed 3000 mg in one day. I will send in a prescription for topical pain medicine, Voltaren gel, as well. She will need an ACC or PCP appointment to evaluate her shoulder if pain continues.

## 2016-12-08 ENCOUNTER — Telehealth: Payer: Self-pay

## 2016-12-08 NOTE — Telephone Encounter (Signed)
Patient's husband called to report that physical therapist said patient's right shoulder (rotator cuff) is inflamed and encouraged him to call PCP for something to treat inflammation. Reports inflammation came from previous fall 2 months ago

## 2016-12-08 NOTE — Telephone Encounter (Signed)
Pt will need to be seen in clinic [facility]) to evaluate further  Recommend use of voltaren gel and tylenol PRN meanwhile

## 2016-12-09 NOTE — Telephone Encounter (Signed)
Call Mr. Joy Butler to advise him that MD said to use votaren gel and tylenol prn needs to be seen in [facility] for further eval and trx. Offered to schedule appointment for today Mr. Joy Butler said she will be fine has appointment next week.

## 2016-12-10 ENCOUNTER — Telehealth: Payer: Self-pay | Admitting: Internal Medicine

## 2016-12-10 NOTE — Telephone Encounter (Signed)
APT. REMINDER CALL, NO ANSWER, NO VOICEMAIL °

## 2016-12-11 ENCOUNTER — Ambulatory Visit (INDEPENDENT_AMBULATORY_CARE_PROVIDER_SITE_OTHER): Payer: Medicare Other | Admitting: Internal Medicine

## 2016-12-11 ENCOUNTER — Encounter (INDEPENDENT_AMBULATORY_CARE_PROVIDER_SITE_OTHER): Payer: Self-pay

## 2016-12-11 VITALS — BP 128/63 | HR 73 | Temp 98.1°F | Ht 69.0 in | Wt 213.9 lb

## 2016-12-11 DIAGNOSIS — M25411 Effusion, right shoulder: Secondary | ICD-10-CM

## 2016-12-11 DIAGNOSIS — M75100 Unspecified rotator cuff tear or rupture of unspecified shoulder, not specified as traumatic: Secondary | ICD-10-CM | POA: Insufficient documentation

## 2016-12-11 DIAGNOSIS — M75101 Unspecified rotator cuff tear or rupture of right shoulder, not specified as traumatic: Secondary | ICD-10-CM

## 2016-12-11 DIAGNOSIS — M7551 Bursitis of right shoulder: Secondary | ICD-10-CM

## 2016-12-11 LAB — SYNOVIAL CELL COUNT + DIFF, W/ CRYSTALS
LYMPHOCYTES-SYNOVIAL FLD: 15 % (ref 0–20)
Monocyte-Macrophage-Synovial Fluid: 84 % (ref 50–90)
Neutrophil, Synovial: 1 % (ref 0–25)
WBC, Synovial: 89 /mm3 (ref 0–200)

## 2016-12-11 NOTE — Assessment & Plan Note (Signed)
Patient presenting with right shoulder pain for the past 3 months. Patient first noticed pain after a fall prior to a hospital admission. Since then pain has progressively worsened. She has tried rubbing alcohol and Tylenol without any relief. She rates pain as 9 out of 10 in severity. Pain is a throbbing sensation. Pain is worse at night. Denies hand weakness or numbness. 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 On exam she is tender to palpation in her right anterior shoulder joint. Ultrasound of right shoulder reveals significant fluid in right shoulder bursa with bone irregularity.   Assessment: Imaging consistent with rotator cuff tear.  Plan: Right shoulder bursa aspiration with steroid injection. We will send fluid and for cell count and  crystals. Xray of rt shoulder ordered to be done next week. F/u in 1 month.   Shoulder Arthrocentesis with Injection Procedure Note  Diagnosis: right rotator cuff tear  Indications: Symptomatic relief of large effusion  Anesthesia: Lidocaine 1% without epinephrine  Procedure Details   Point of care ultrasound was used to identify the joint effusion and plan needle trajectory and depth. Consent was obtained for the procedure. The joint was prepped with Betadine. A 22 gauge needle was inserted into the superior aspect of the joint from a medial approach to access the suprapatellar pouch. 40 ml of clear yellow fluid was removed from the joint and sent to the lab for analysis. 1 ml 1% lidocaine and 40mg  of Triamcinolone was then injected into the joint through the same needle. The needle was removed and the area cleansed and dressed.  Complications:  None; patient tolerated the procedure well.

## 2016-12-11 NOTE — Patient Instructions (Signed)
Make sure to get your right shoulder xray next week.   Follow up in clinic in one month.

## 2016-12-11 NOTE — Progress Notes (Incomplete)
Internal Medicine Clinic Attending  I saw and evaluated the patient.  I personally confirmed the key portions of the history and exam documented by Dr. Hulen Luster and I reviewed pertinent patient test results.  The assessment, diagnosis, and plan were formulated together and I agree with the documentation in the resident's note. I was present for the entirety of the procedure and personally performed key portions.   Patient with right shoulder pain for several weeks, progressive with weakness. Pain with passive and active range of motion. Point of care ultrasound showed a very large subacromial bursa filled with fluid with small round hyperechoic densities. There is significant cortical irregularities as well. Subscap and supraspinatous portions of the rotator cuff have full thickness tears. We were able to drain the subacromial bursa

## 2016-12-11 NOTE — Progress Notes (Signed)
   CC: Right shoulder pain  HPI:  [name] is a 65 y.o. with possible history as outlined below who presents to clinic for right shoulder pain. He see problem list for further details.  Past Medical History:  Diagnosis Date  . Arthritis    "knees" (07/26/2014)  . Cataract of both eyes   . Chronic back pain   . Daily headache   . Depression   . High cholesterol   . Hypertension   . Hypertensive emergency 12/03/2015  . Kidney stones   . Migraine    "last one was in the 1990's" (07/26/2014)  . Neuropathy (HCC)    feet  . Pneumonia    "several times" (07/26/2014)  . Pulmonary embolism ([place])    2011 treated at [facility] in [place].  Was on Coumadin for over a year.  no known family history  . 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 Type II diabetes mellitus ([place]) dx'd 2000  . UTI (urinary tract infection)     Review of Systems:  Right shoulder pain, denies fevers.  Physical Exam:  Vitals:   12/11/16 1615  BP: 128/63  Pulse: 73  Temp: 98.1 F (36.7 C)  TempSrc: Oral  SpO2: 100%  Weight: 213 lb 14.4 oz (97 kg)  Height: 5\' 9"  (1.753 m)   Physical Exam  Constitutional: oriented to person, place, and time. appears well-developed and well-nourished. No distress.  HENT:  Head: Normocephalic and atraumatic.  Nose: Nose normal.  Musculoskeletal: Right shoulder non-erythematous. Tender to palpation of anterior shoulder joint. Limited range of motion of right shoulder due to pain. Unable to fully extend right arm forward with assistance. Positive Neer's test negative drop arm test. Pain on internal rotation  Assessment & Plan:   See Encounters Tab for problem based charting.  Patient seen with Dr. Evette Doffing

## 2016-12-11 NOTE — Progress Notes (Signed)
Internal Medicine Clinic Attending  I saw and evaluated the patient.  I personally confirmed the key portions of the history and exam documented by Dr. Hulen Luster and I reviewed pertinent patient test results.  The assessment, diagnosis, and plan were formulated together and I agree with the documentation in the resident's note. I was present for the entirety of the procedure and personally performed key portions.   Patient with right shoulder pain for several weeks, progressive with weakness. Pain with passive and active range of motion. Point of care ultrasound showed a very large subacromial bursa filled with fluid with small round hyperechoic densities. There is significant cortical irregularities as well. Subscap and supraspinatous portions of the rotator cuff have full thickness tears. We were able to drain the subacromial bursa of 45 ml of clear fluid, analysis is without infection but there are calcium pyrophosphate crystals, indicating some amount of CPPD here.  We infused steroid for symptomatic treatment. Ultrasound was used in this procedure.  Anterior transverse view of the right shoulder showing large subacromial bursa fluid collection and small calcium deposits deep.    Pos arthrocentesis view of the anterior shoulder showing residual mild effusion within the subacromial bursa.

## 2016-12-16 ENCOUNTER — Ambulatory Visit: Payer: Medicare Other

## 2016-12-22 ENCOUNTER — Ambulatory Visit (HOSPITAL_COMMUNITY)
Admission: RE | Admit: 2016-12-22 | Discharge: 2016-12-22 | Disposition: A | Discharge status: Home / Self Care | Payer: Medicare Other | Source: Ambulatory Visit | Attending: Internal Medicine | Admitting: Internal Medicine

## 2016-12-22 DIAGNOSIS — M75101 Unspecified rotator cuff tear or rupture of right shoulder, not specified as traumatic: Secondary | ICD-10-CM | POA: Diagnosis present

## 2016-12-22 DIAGNOSIS — M19011 Primary osteoarthritis, right shoulder: Secondary | ICD-10-CM | POA: Diagnosis not present

## 2017-01-11 ENCOUNTER — Encounter: Payer: Medicare Other | Admitting: Internal Medicine

## 2017-01-11 ENCOUNTER — Other Ambulatory Visit: Payer: Self-pay | Admitting: Internal Medicine

## 2017-01-11 DIAGNOSIS — I2699 Other pulmonary embolism without acute cor pulmonale: Secondary | ICD-10-CM

## 2017-01-12 ENCOUNTER — Telehealth: Payer: Self-pay | Admitting: Internal Medicine

## 2017-01-12 NOTE — Telephone Encounter (Signed)
[facility] Physical Therapist  Stanton Kidney requesting to  continue  PT   For 2 more weeks.  Please call back  to [phone]

## 2017-01-13 NOTE — Telephone Encounter (Signed)
PT would like to continue to see pt for 2 weeks to reach goals of endurance, balance and strength, VO given, do you agree

## 2017-01-14 NOTE — Telephone Encounter (Signed)
agree

## 2017-01-20 ENCOUNTER — Ambulatory Visit: Payer: Medicare Other

## 2017-01-21 ENCOUNTER — Encounter: Payer: Self-pay | Admitting: Pulmonary Disease

## 2017-01-21 ENCOUNTER — Ambulatory Visit (INDEPENDENT_AMBULATORY_CARE_PROVIDER_SITE_OTHER): Payer: Medicare Other | Admitting: Pulmonary Disease

## 2017-01-21 VITALS — BP 194/97 | HR 89 | Temp 98.1°F | Ht 69.0 in | Wt 217.0 lb

## 2017-01-21 DIAGNOSIS — N183 Chronic kidney disease, stage 3 unspecified: Secondary | ICD-10-CM

## 2017-01-21 DIAGNOSIS — I130 Hypertensive heart and chronic kidney disease with heart failure and stage 1 through stage 4 chronic kidney disease, or unspecified chronic kidney disease: Secondary | ICD-10-CM

## 2017-01-21 DIAGNOSIS — R5382 Chronic fatigue, unspecified: Secondary | ICD-10-CM

## 2017-01-21 DIAGNOSIS — E538 Deficiency of other specified B group vitamins: Secondary | ICD-10-CM

## 2017-01-21 DIAGNOSIS — M1120 Other chondrocalcinosis, unspecified site: Secondary | ICD-10-CM | POA: Insufficient documentation

## 2017-01-21 DIAGNOSIS — M174 Other bilateral secondary osteoarthritis of knee: Secondary | ICD-10-CM

## 2017-01-21 DIAGNOSIS — M1180 Other specified crystal arthropathies, unspecified site: Secondary | ICD-10-CM

## 2017-01-21 DIAGNOSIS — M75101 Unspecified rotator cuff tear or rupture of right shoulder, not specified as traumatic: Secondary | ICD-10-CM | POA: Diagnosis not present

## 2017-01-21 DIAGNOSIS — M11811 Other specified crystal arthropathies, right shoulder: Secondary | ICD-10-CM

## 2017-01-21 DIAGNOSIS — I5022 Chronic systolic (congestive) heart failure: Secondary | ICD-10-CM

## 2017-01-21 DIAGNOSIS — Z79899 Other long term (current) drug therapy: Secondary | ICD-10-CM

## 2017-01-21 DIAGNOSIS — I10 Essential (primary) hypertension: Secondary | ICD-10-CM

## 2017-01-21 DIAGNOSIS — I509 Heart failure, unspecified: Secondary | ICD-10-CM

## 2017-01-21 DIAGNOSIS — F17211 Nicotine dependence, cigarettes, in remission: Secondary | ICD-10-CM

## 2017-01-21 DIAGNOSIS — N189 Chronic kidney disease, unspecified: Secondary | ICD-10-CM | POA: Diagnosis not present

## 2017-01-21 DIAGNOSIS — E559 Vitamin D deficiency, unspecified: Secondary | ICD-10-CM

## 2017-01-21 MED ORDER — COLCHICINE 0.6 MG PO TABS: 0.6000 mg | ORAL_TABLET | Freq: Two times a day (BID) | ORAL | 2 refills | Status: AC

## 2017-01-21 NOTE — Patient Instructions (Addendum)
We will refer you to the Sports Medicine clinic. Take colchicine twice a day. If you experience abdominal pain or diarrhea, you can try taking it just once a day. Follow up in 6 weeks or sooner as needed

## 2017-01-21 NOTE — Assessment & Plan Note (Signed)
Assessment: Chronic knee pain. She states that only codeine and Vicodin have helped her in the past. She was on Vicodin for 6 months. This was discontinued. The other thing that helped her was joint injections she was previously receiving from the [facility]. She would like to go back there.  Plan: Check vitamin D level Referral to [place]

## 2017-01-21 NOTE — Assessment & Plan Note (Signed)
History of chronic fatigue. She has been having chills after she eats. Possible cold intolerance.  Check TSH today.

## 2017-01-21 NOTE — Progress Notes (Signed)
   CC: Right shoulder pain  HPI:  [name] is a 66 y.o. with history as outlined below here for follow up of her right shoulder pain.  She was seen in clinic 12/11/2016 and was found to have a rotator cuff tear. She underwent joint injection with triamcinolone and lidocaine. Fluid analysis was 89 WBC. 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 XR of her right shoulder has degenerative changes. She is currently receiving PT. She only has minimal relief with Voltaren gel. Tylenol with codeine seemed to help.   Joint injection provided about 5 days of relief. PT is helping her with mobility.   She would like to start seeing [facility] again. Last saw them in 2016.   Past Medical History:  Diagnosis Date  . Arthritis    "knees" (07/26/2014)  . Cataract of both eyes   . Chronic back pain   . Daily headache   . Depression   . High cholesterol   . Hypertension   . Hypertensive emergency 12/03/2015  . Kidney stones   . Migraine    "last one was in the 1990's" (07/26/2014)  . Neuropathy (HCC)    feet  . Pneumonia    "several times" (07/26/2014)  . Pulmonary embolism ([place])    2011 treated at [facility] in [place].  Was on Coumadin for over a year.  no known family history  . 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 Type II diabetes mellitus ([place]) dx'd 2000  . UTI (urinary tract infection)     Review of Systems:   No fevers No abdominal pain  Physical Exam:  Vitals:   01/21/17 0909  BP: (!) 198/104  Pulse: 98  Temp: 98.1 F (36.7 C)  TempSrc: Oral  SpO2: 99%  Weight: 217 lb (98.4 kg)  Height: 5\' 9"  (1.753 m)   General Apperance: NAD HEENT: Normocephalic, atraumatic, anicteric sclera Neck: Supple, trachea midline Lungs: Clear to auscultation bilaterally. No wheezes, rhonchi or rales. Breathing comfortably on room air Heart: Regular rate and rhythm Abdomen: Soft, nontender, nondistended Extremities: Warm and well perfused.  Tender to palpation over anterior right shoulder joint and anterior bilateral knees. Skin: No rashes or lesions Neurologic: Alert and interactive. No gross deficits.   Assessment & Plan:   See Encounters Tab for problem based charting.  Patient discussed with Dr. Daryll Drown

## 2017-01-21 NOTE — Assessment & Plan Note (Signed)
Assessment: She was noted to have calcium pyrophosphate crystals intracellular on her right shoulder joint aspirate analysis. Her right shoulder chronic pain with acute flares is likely multifactorial from the rotator cuff tear, crystal disease, and degenerative disease. Joint injection in the shoulder with triamcinolone provided about 5 days of relief.   Plan: Continue PT Colchicine 0.6mg  BID (CrCl 34). Instructed her that she may consider taking it daily if she experiences abdominal discomfort and/or diarrhea.  Check vitamin D level Follow up in 6 weeks or sooner as needed

## 2017-01-21 NOTE — Assessment & Plan Note (Signed)
Complains of frequent urination with her Lasix. Discussed with patient that she may take her Lasix daily as needed for increased shortness of breath, orthopnea, leg swelling.

## 2017-01-21 NOTE — Assessment & Plan Note (Signed)
BP uncontrolled today with Systolic in [id number]. She has not taken her BP medication this morning. Previously was noted to have BP controlled.  Continue amlodipine 10mg  daily, metoprolol 25mg  BID Recheck in 6 weeks.

## 2017-01-21 NOTE — Assessment & Plan Note (Signed)
Recheck BMP today to evaluate her renal disease

## 2017-01-22 DIAGNOSIS — E559 Vitamin D deficiency, unspecified: Secondary | ICD-10-CM | POA: Insufficient documentation

## 2017-01-22 LAB — BMP8+ANION GAP
ANION GAP: 16 mmol/L (ref 10.0–18.0)
BUN/Creatinine Ratio: 15 (ref 12–28)
BUN: 35 mg/dL — AB (ref 8–27)
CALCIUM: 9 mg/dL (ref 8.7–10.3)
CHLORIDE: 108 mmol/L — AB (ref 96–106)
CO2: 18 mmol/L (ref 18–29)
CREATININE: 2.36 mg/dL — AB (ref 0.57–1.00)
GFR calc Af Amer: 24 mL/min/{1.73_m2} — ABNORMAL LOW (ref >59)
GFR calc non Af Amer: 21 mL/min/{1.73_m2} — ABNORMAL LOW (ref >59)
Glucose: 101 mg/dL — ABNORMAL HIGH (ref 65–99)
Potassium: 4.8 mmol/L (ref 3.5–5.2)
Sodium: 142 mmol/L (ref 134–144)

## 2017-01-22 LAB — TSH: TSH: 1.48 u[IU]/mL (ref 0.450–4.500)

## 2017-01-22 LAB — VITAMIN D 25 HYDROXY (VIT D DEFICIENCY, FRACTURES): Vit D, 25-Hydroxy: 13.2 ng/mL — ABNORMAL LOW (ref 30.0–100.0)

## 2017-01-22 MED ORDER — VITAMIN D 1000 UNITS PO TABS: 1000.0000 [IU] | ORAL_TABLET | Freq: Every day | ORAL | 2 refills | Status: AC

## 2017-01-22 NOTE — Addendum Note (Signed)
Addended by: Jacques Earthly T on: 01/22/2017 11:38 AM   Modules accepted: Orders

## 2017-01-22 NOTE — Assessment & Plan Note (Signed)
Vitamin D level 13.  Start Vitamin D 1000 international units daily Repeat serum 25(OH)D level after approximately three months of therapy

## 2017-01-26 ENCOUNTER — Telehealth: Payer: Self-pay

## 2017-01-26 NOTE — Telephone Encounter (Signed)
VO for PT 10 more visits for balance, ambulation and safety- do you agree?                     Mary [phone]

## 2017-01-26 NOTE — Telephone Encounter (Signed)
Mary from [facility] requesting VO. Please call back.

## 2017-01-26 NOTE — Progress Notes (Signed)
Internal Medicine Clinic Attending  Case discussed with Dr. Krall soon after the resident saw the patient.  We reviewed the resident's history and exam and pertinent patient test results.  I agree with the assessment, diagnosis, and plan of care documented in the resident's note. 

## 2017-01-27 ENCOUNTER — Telehealth: Payer: Self-pay | Admitting: *Deleted

## 2017-01-27 NOTE — Telephone Encounter (Signed)
agree

## 2017-01-27 NOTE — Telephone Encounter (Signed)
Diarrhea is common side effect of colchicine. It was recently started, Please instruct the patient to take colchicine once a day instead of twice a day and see if it helps. If the diarrhea persists (> 6 episodes per day), then she should stop the colchicine. If they persist after that then she needs an appt to discuss further.

## 2017-01-27 NOTE — Telephone Encounter (Signed)
Joy Butler (PT)  from [facility] called saying that patient has been having diarrhea for few days now after starting colchicine.  Also still having pain to R shoulder & bil knees.  BP 180/110 & has not taken her bp meds. Instructed nurse to assist with taking her antihypertensives. Patient is able to drink fluids & voiding well.  She does not want to come in for check up, she doesn't think she's dehydrated. Next appt w/pcp 02/22/17. Requesting to be called back. Thanks!

## 2017-01-29 ENCOUNTER — Encounter: Payer: Self-pay | Admitting: Family Medicine

## 2017-01-29 ENCOUNTER — Ambulatory Visit (INDEPENDENT_AMBULATORY_CARE_PROVIDER_SITE_OTHER): Payer: Medicare Other | Admitting: Family Medicine

## 2017-01-29 VITALS — BP 193/98 | Ht 69.0 in | Wt 195.0 lb

## 2017-01-29 DIAGNOSIS — M174 Other bilateral secondary osteoarthritis of knee: Secondary | ICD-10-CM

## 2017-01-29 MED ORDER — METHYLPREDNISOLONE ACETATE 40 MG/ML IJ SUSP
40.0000 mg | Freq: Once | INTRAMUSCULAR | Status: AC
  Administered 2017-01-29: 40 mg via INTRA_ARTICULAR

## 2017-01-29 NOTE — Progress Notes (Signed)
Joy Butler - 66 y.o. female MRN [id number]  Date of birth: 02-14-1951    SUBJECTIVE:      Chief Complaint:/ HPI:   #1. Bilateral knee pain #2. Right shoulder pain  Bilateral knee pain for many years. Has been diagnosed with severe DJD bilateral knees. Has had corticosteroid injections in the past which were helpful. She has not had one for greater than a year. She would like to have both knees injected today as they are causing her 8-10 out of 10 pain most of the time. She is significantly reduced any activities that involve walking. She does have night pain. She's had no recent falls.  Right shoulder pain which is being followed by her primary care provider. She recently had some type of fluid aspiration and injection. She's not really sure what they did but wonders if we could do something additional because while the aspiration was helpful as well as the injection, that was about a month ago and her symptoms are starting to return.   ROS:     Denies calf pain. She's noted no swelling or redness of her knees that is unusual.  PERTINENT  PMH / PSH FH / / SH:  Past Medical, Surgical, Social, and Family History Reviewed & Updated in the EMR.  Pertinent findings include:  Known DJD knees Multiple other comorbidities: Hypertension, long-term use of anticoagulant, Xarelto. Smoking. Diabetes with history of diabetic foot ulcer. Hypercholesterolemia. History of noncompliance with medication. History of pulmonary embolism. History of chronic pain syndrome. History of CHF. History of acute blood loss anemia and ARDS  OBJECTIVE: BP (!) 193/98   Ht 5\' 9"  (1.753 m)   Wt 195 lb (88.5 kg)   BMI 28.80 kg/m    Physical Exam:  Vital signs are reviewed. Well-developed female no acute distress. KNEES: External changes consistent with synovial hypertrophy and DJD with mild deformity. She has full extension on the left, lacks full extension on the right by 10. Flexion to 100 bilaterally.  Ligamentously intact to varus and valgus stress. Patellar crepitus bilaterally right greater than left. Popliteal space is benign. The calves are soft. Neck soft Skin: She has no skin lesions, no unusual erythema or warmth of the skin around the area of the knees. VASCULAR: Dorsalis pedis pulses are trace to 1+ bilaterally symmetrical. She has normal cap refill in her feet and ankles. EXTREMITY: No significant edema bilateral lower extremity.  IMAGING:03/2016 and 02/2015. Images from 2016 shows significant to severe DJD. The films were not standing or weight bearing so it's difficult to ascertain accurate amount of joint space loss. The image of the right knee from 2017 shows bone-on-bone joint space loss with axial shifting consistent with severe tricompartmental DJD.  INJECTION: Patient was given informed consent, signed copy in the chart. Appropriate time out was taken. Area prepped and draped in usual sterile fashion. 1 cc of methylprednisolone 40 mg/ml plus  4 cc of 1% lidocaine without epinephrine was injected into the bilateral knees using a(n) anterior medial approach. The patient tolerated the procedure well. There were no complications. Post procedure instructions were given.  ASSESSMENT & PLAN:  See problem based charting & AVS for pt instructions.  We also discussed briefly her shoulder issues. Sounds like as being handled by her primary care provider and she has follow-up appointment with them. Since they did the initial arthrocentesis and injection I will let them complete follow-up however we would be happy to see here for that should she and her PCP  decide that.

## 2017-01-29 NOTE — Telephone Encounter (Signed)
Called patient & read MD instructions regarding colchicine. She stated diarrhea improved some.  Still c/o R shoulder pain, taking tylenol but no relief. Wants to know if she can have something else for the pain. Appt w/ pcp 02/22/17.

## 2017-02-12 ENCOUNTER — Other Ambulatory Visit: Payer: Self-pay | Admitting: Internal Medicine

## 2017-02-22 ENCOUNTER — Encounter: Payer: Medicare Other | Admitting: Internal Medicine

## 2017-02-22 ENCOUNTER — Encounter: Payer: Self-pay | Admitting: Internal Medicine

## 2017-02-22 ENCOUNTER — Telehealth: Payer: Self-pay

## 2017-03-05 ENCOUNTER — Other Ambulatory Visit: Payer: Self-pay | Admitting: Internal Medicine

## 2017-03-08 ENCOUNTER — Telehealth: Payer: Self-pay

## 2017-03-08 NOTE — Telephone Encounter (Signed)
Joy Butler from [facility] requesting to speak with a nurse. Please call back.

## 2017-03-08 NOTE — Telephone Encounter (Signed)
Lm for rtc 

## 2017-03-12 ENCOUNTER — Ambulatory Visit (INDEPENDENT_AMBULATORY_CARE_PROVIDER_SITE_OTHER): Payer: Medicare Other | Admitting: Internal Medicine

## 2017-03-12 ENCOUNTER — Encounter: Payer: Self-pay | Admitting: Internal Medicine

## 2017-03-12 DIAGNOSIS — E669 Obesity, unspecified: Secondary | ICD-10-CM | POA: Diagnosis not present

## 2017-03-12 DIAGNOSIS — M25411 Effusion, right shoulder: Secondary | ICD-10-CM

## 2017-03-12 DIAGNOSIS — M11211 Other chondrocalcinosis, right shoulder: Secondary | ICD-10-CM | POA: Diagnosis not present

## 2017-03-12 DIAGNOSIS — M19011 Primary osteoarthritis, right shoulder: Secondary | ICD-10-CM | POA: Diagnosis not present

## 2017-03-12 DIAGNOSIS — Z6831 Body mass index (BMI) 31.0-31.9, adult: Secondary | ICD-10-CM

## 2017-03-12 DIAGNOSIS — M25511 Pain in right shoulder: Principal | ICD-10-CM

## 2017-03-12 DIAGNOSIS — G8929 Other chronic pain: Secondary | ICD-10-CM | POA: Insufficient documentation

## 2017-03-12 NOTE — Patient Instructions (Addendum)
Please continue to use tylenol for the pain.  Follow up with your regular doctor for your other medical problems.

## 2017-03-12 NOTE — Assessment & Plan Note (Signed)
Has chronic shoulder pain 2/2 to OA, pseudogout, and likely rotator cuff tendonitis. We did u/s guided arthrocentesis removing 25 cc fluid yellow with some cloudiness for symptomatic relief. No puss seen. We did not send it for labs as we know she has hx of CPPD. Low suspicion for infection. Patient had immediate relief of the pain.  - continue tylenol for pain control. Could not tolerate NSAID or colchicine. f/up as needed for this.

## 2017-03-12 NOTE — Progress Notes (Signed)
   CC: right shoulder pain   HPI:  [name] is a 66 y.o. with PMh as listed below is here with right shoulder pain.  Has chronic right shoulder pain with intermittent flares. Previous joint aspiration on 12/11/16 revealed calcium pyrophosphate crystal. Had joint injection in the past which gave her 5 days of relief. Could not tolerate colcichine due to diarrhea. Has 10/10 shoulder pain which is progressively getting worse. No new injuries. No fevers. Had knee injection with sports med on 2/9.   Xray of right shoulder 12/2016: Degenerative changes with narrowing of glenohumeral joint space and spurring of glenoid.   Past Medical History:  Diagnosis Date  . Arthritis    "knees" (07/26/2014)  . Cataract of both eyes   . Chronic back pain   . Daily headache   . Depression   . High cholesterol   . Hypertension   . Hypertensive emergency 12/03/2015  . Kidney stones   . Migraine    "last one was in the 1990's" (07/26/2014)  . Neuropathy (HCC)    feet  . Pneumonia    "several times" (07/26/2014)  . Pulmonary embolism ([place])    2011 treated at [facility] in [place].  Was on Coumadin for over a year.  no known family history  . 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 Type II diabetes mellitus ([place]) dx'd 2000  . UTI (urinary tract infection)     Review of Systems:   Review of Systems  Constitutional: Negative for chills and fever.  Cardiovascular: Negative for chest pain and palpitations.  Musculoskeletal: Positive for joint pain.     Physical Exam:  Vitals:   03/12/17 1321  BP: (!) 168/95  Pulse: 85  Temp: 98.6 F (37 C)  TempSrc: Oral  SpO2: 99%  Weight: 213 lb 9.6 oz (96.9 kg)  Height: 5\' 9"  (1.753 m)   Physical Exam  Constitutional: She is oriented to person, place, and time. She appears well-developed and well-nourished.  Obese female.  Cardiovascular: Normal rate and regular rhythm.  Exam reveals no gallop and no friction rub.   No murmur  heard. Respiratory: Effort normal and breath sounds normal.  Musculoskeletal:  Has limited range of motion of the shoulder to all planes but especially to extension. Has tenderness over the shoulder joint and neck muscle on the right. No warmth or erythema.   Neurological: She is alert and oriented to person, place, and time.       Shoulder Arthrocentesis with Injection Procedure Note  Diagnosis: right shoulder pain  Indications: Symptomatic relief of large effusion  Anesthesia: Pain Ease spray  Procedure Details   Point of care ultrasound was used to identify the joint effusion and plan needle trajectory and depth. Consent was obtained for the procedure. The joint was prepped with Betadine. A 22 gauge needle was inserted into the posterior lateral aspect of the shoulder joint.  25 ml of cloudy yellow fluid was removed from the subacromial bursa and discarded. 2 ml 1% lidocaine and 1 ml of Triamcinolone (40mg ) was then injected into the joint through the same needle. The needle was removed and the area cleansed and dressed.  Complications:  None; patient tolerated the procedure well.    Assessment & Plan:   See Encounters Tab for problem based charting.  Patient seen with Dr. Angelia Mould

## 2017-03-15 NOTE — Progress Notes (Signed)
Internal Medicine Clinic Attending  I saw and evaluated the patient.  I personally confirmed the key portions of the history and exam documented by Dr. Genene Churn and I reviewed pertinent patient test results.  The assessment, diagnosis, and plan were formulated together and I agree with the documentation in the resident's note. POCUS revealed joint effusion of the right shoulder.  I was present for the entire right shoulder arthrocentesis and steroid injection which was done with US guidance.  Patient tolerated well and had relief of her right shoulder pain.

## 2017-04-02 ENCOUNTER — Other Ambulatory Visit: Payer: Self-pay | Admitting: Internal Medicine

## 2017-04-12 ENCOUNTER — Encounter: Payer: Medicare Other | Admitting: Internal Medicine

## 2017-04-21 ENCOUNTER — Ambulatory Visit (INDEPENDENT_AMBULATORY_CARE_PROVIDER_SITE_OTHER): Payer: Medicare Other | Admitting: Internal Medicine

## 2017-04-21 VITALS — BP 159/91 | HR 90 | Temp 98.2°F | Ht 69.0 in | Wt 233.0 lb

## 2017-04-21 DIAGNOSIS — M25511 Pain in right shoulder: Secondary | ICD-10-CM

## 2017-04-21 DIAGNOSIS — M25411 Effusion, right shoulder: Secondary | ICD-10-CM

## 2017-04-21 DIAGNOSIS — M11211 Other chondrocalcinosis, right shoulder: Secondary | ICD-10-CM

## 2017-04-21 DIAGNOSIS — G8929 Other chronic pain: Secondary | ICD-10-CM

## 2017-04-21 DIAGNOSIS — N184 Chronic kidney disease, stage 4 (severe): Secondary | ICD-10-CM

## 2017-04-21 LAB — SYNOVIAL CELL COUNT + DIFF, W/ CRYSTALS
Crystals, Fluid: NONE SEEN
Lymphocytes-Synovial Fld: 6 % (ref 0–20)
Monocyte-Macrophage-Synovial Fluid: 89 % (ref 50–90)
NEUTROPHIL, SYNOVIAL: 5 % (ref 0–25)
WBC, Synovial: 201 /mm3 — ABNORMAL HIGH (ref 0–200)

## 2017-04-21 NOTE — Progress Notes (Signed)
   CC: R shoulder pain.   HPI:  [name] is a 66 y.o. f/up of R shoulder pain  On 3/23 had large shoulder effusion that was drained (25 cc fluid) with steroid injection. Has hx of CPPD so we did not send it for analysis as suspicion for infection was low. 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   Pain improved with injection for 1 month to zero, and then worsened again, tylenol is not helping so stopped taking it. 10/10 pain now, unable to move the R shoulder.     Past Medical History:  Diagnosis Date  . Arthritis    "knees" (07/26/2014)  . Cataract of both eyes   . Chronic back pain   . Daily headache   . Depression   . High cholesterol   . Hypertension   . Hypertensive emergency 12/03/2015  . Kidney stones   . Migraine    "last one was in the 1990's" (07/26/2014)  . Neuropathy    feet  . Pneumonia    "several times" (07/26/2014)  . Pulmonary embolism ([place])    2011 treated at [facility] in [place].  Was on Coumadin for over a year.  no known family history  . 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 Type II diabetes mellitus ([place]) dx'd 2000  . UTI (urinary tract infection)     Review of Systems:   Review of Systems  Constitutional: Negative for chills and fever.  Gastrointestinal: Negative for heartburn and nausea.  Musculoskeletal: Positive for joint pain.  Neurological: Negative for dizziness.     Physical Exam:  Vitals:   04/21/17 1330  BP: (!) 159/91  Pulse: 90  Temp: 98.2 F (36.8 C)  TempSrc: Oral  SpO2: 99%  Weight: 233 lb (105.7 kg)  Height: 5\' 9"  (1.753 m)   Physical Exam  Constitutional: She is oriented to person, place, and time. She appears well-developed and well-nourished.  HENT:  Head: Normocephalic and atraumatic.  Respiratory: Effort normal and breath sounds normal.  Musculoskeletal:  Shoulder ROM is limited due to pain to about 30 degrees, has pain with active and passive motion, no warmth or  erythema. Bulge noted on anterior aspect of right shoulder.   Neurological: She is alert and oriented to person, place, and time.    Performed POC U/s of R shoulder which showed large sized effusion in subacromial and subdeltoid bursae. Calcifications over the humerus head which is causing impingement.   Shoulder Arthrocentesis without Injection Procedure Note  Diagnosis: right shoulder pain  Indications: Symptomatic relief of large effusion  Anesthesia: Lidocaine 1% with epinephrine  Procedure Details   Point of care ultrasound was used to identify the joint effusion and plan needle trajectory and depth. Consent was obtained for the procedure. The joint was prepped with Betadine. Lidocaine 1% was used as local anesthetic. A 18 gauge needle was inserted into anterior shoulder joint. 45 ml of clear yellow fluid was removed from the joint. The needle was removed and the area cleansed and dressed.  Complications:  None; patient tolerated the procedure well.    Assessment & Plan:   See Encounters Tab for problem based charting.  Patient discussed with Dr. Evette Butler

## 2017-04-21 NOTE — Assessment & Plan Note (Addendum)
Right shoulder pain with swelling, Xray previously showed significant amount of calcium depositions and bone spurs likely from her CPPD. Has large effusion seen on ultrasound, similar to her previous large effusions. Drained it today without steroid injection as she had one less than 2 months ago. Given her recurrent bursitis and mpingement due to CPPD, she may benefit from shoulder surgery.  - will refer to ortho surgery. - cell count and crystal analysis of the fluid.

## 2017-04-21 NOTE — Progress Notes (Signed)
Internal Medicine Clinic Attending  I saw and evaluated the patient and I was present for the entirety of the procedure.  I personally confirmed the key portions of the history and exam documented by Dr. Genene Churn and I reviewed pertinent patient test results.  The assessment, diagnosis, and plan were formulated together and I agree with the documentation in the resident's note.  This is the third time we have drained the right anterior shoulder in the last four months. She clearly has severe impingement from spurring of her subacromion and humeral head. She has a very large collection of fluid that tracks from her subacromial space anteriorly to below the humeral head. She has CPPD with crystals seen on ultrasound and microscopy. At this point we are going to refer her to ortho for consideration of either shaving of the humeral head and subacromial spurs to treat the impingement, or more likely a reverse total shoulder procedure.    Transverse view of the anterior shoulder showing a large fluid collection in the subdeltoid bursa and calcific spurring on the humeral head.

## 2017-04-21 NOTE — Patient Instructions (Addendum)
Will refer you to orthopaedic surgery.

## 2017-05-04 ENCOUNTER — Telehealth: Payer: Self-pay | Admitting: *Deleted

## 2017-05-04 NOTE — Telephone Encounter (Signed)
CALLED AND SPOKE WITH PATIENT REGARDING [facility] REFERRAL. STATES NO ON CONTACTED HER.  GAVE PATIENT HUSBAND THE PHONE TO CALL THEIR OFFICE TO SET UP AN APPOINTMENT.

## 2017-06-11 ENCOUNTER — Telehealth: Payer: Self-pay | Admitting: Internal Medicine

## 2017-06-11 NOTE — Telephone Encounter (Signed)
   Reason for call:   I received a call from the husband of  [name] at 345 PM indicating she needed a refill of her insulin.   Pertinent Data:   Patients husband reports Ms Joy Butler is currently in [place] and needed a refill of her insulin.  Per chart review it appears insulin was discontinued in Dec 2017 by PCP with A1c 6.2 and hypoglycemic episodes at nursing home.    Husband reports to me that she however has been without insulin for 3 weeks in [place] and they reports she is lethargic and sleeping more and confused.   Assessment / Plan / Recommendations:   I advised husband that her family should seek emergency department care for her in [place] due to these concerning symptoms.  He voiced understanding.   Jule Ser, DO   06/11/2017, 3:45 PM

## 2017-06-21 ENCOUNTER — Other Ambulatory Visit: Payer: Self-pay | Admitting: Pharmacist

## 2017-06-21 DIAGNOSIS — I2699 Other pulmonary embolism without acute cor pulmonale: Secondary | ICD-10-CM

## 2017-06-29 MED ORDER — RIVAROXABAN 15 MG PO TABS: ORAL_TABLET | ORAL | 4 refills | Status: AC

## 2017-09-13 ENCOUNTER — Encounter: Payer: Medicare Other | Admitting: Internal Medicine

## 2017-10-28 IMAGING — CR DG FEMUR 2+V*R*
5 series · 5 of 5 positions shown · non-contrast
Comparison: Right knee radiographs earlier this day.

CLINICAL DATA: Fell down 3 steps onto cement block. Anterior right
leg pain. Right hip and knee pain today.

EXAM:
RIGHT FEMUR 2 VIEWS

[femur ap (1 of 3)]
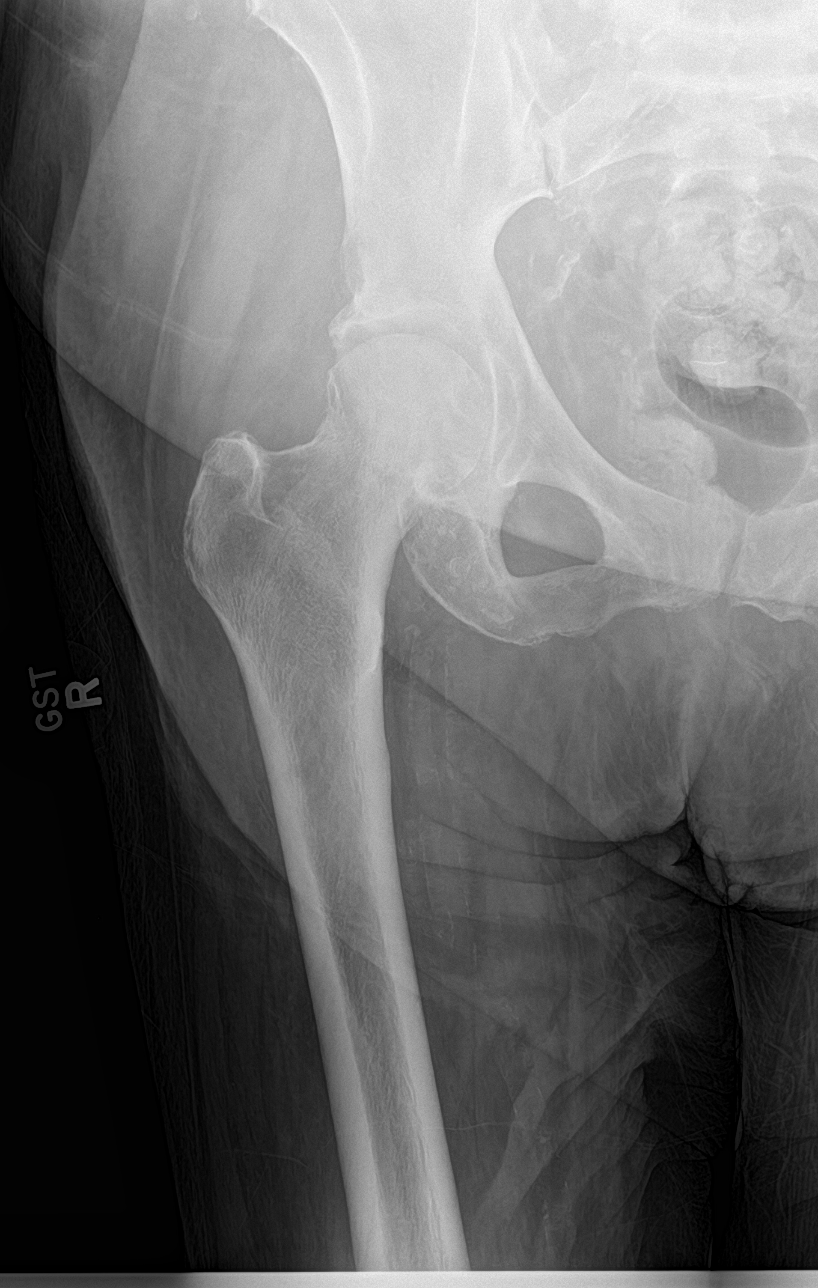

[femur ap (2 of 3)]
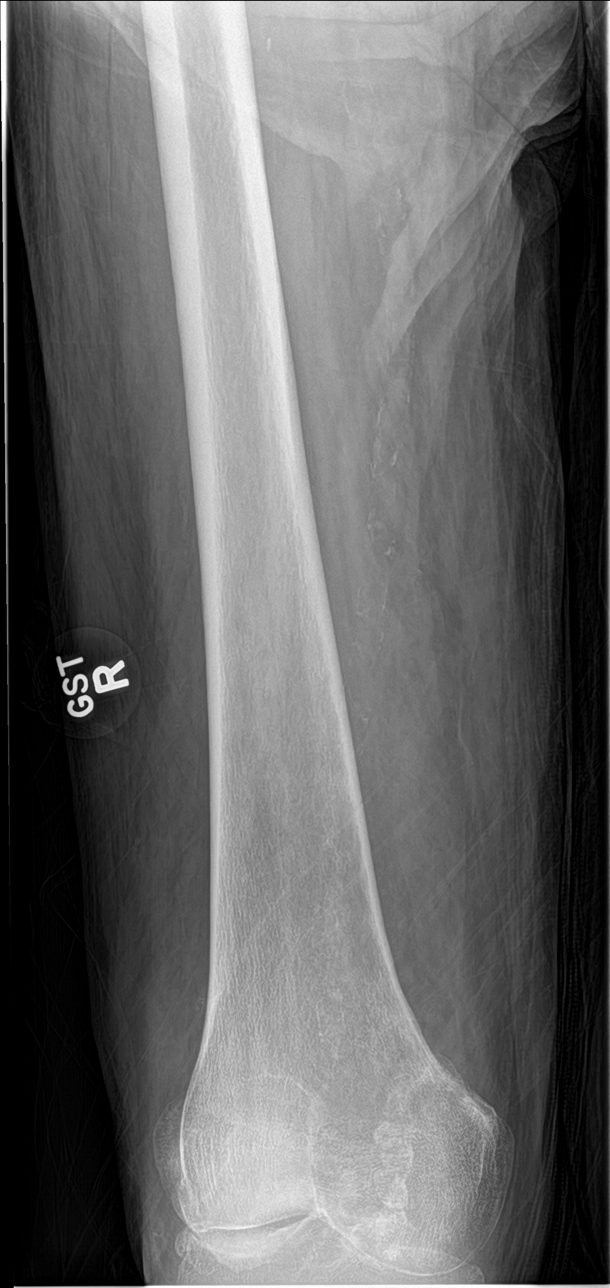

[femur lat (1 of 2)]
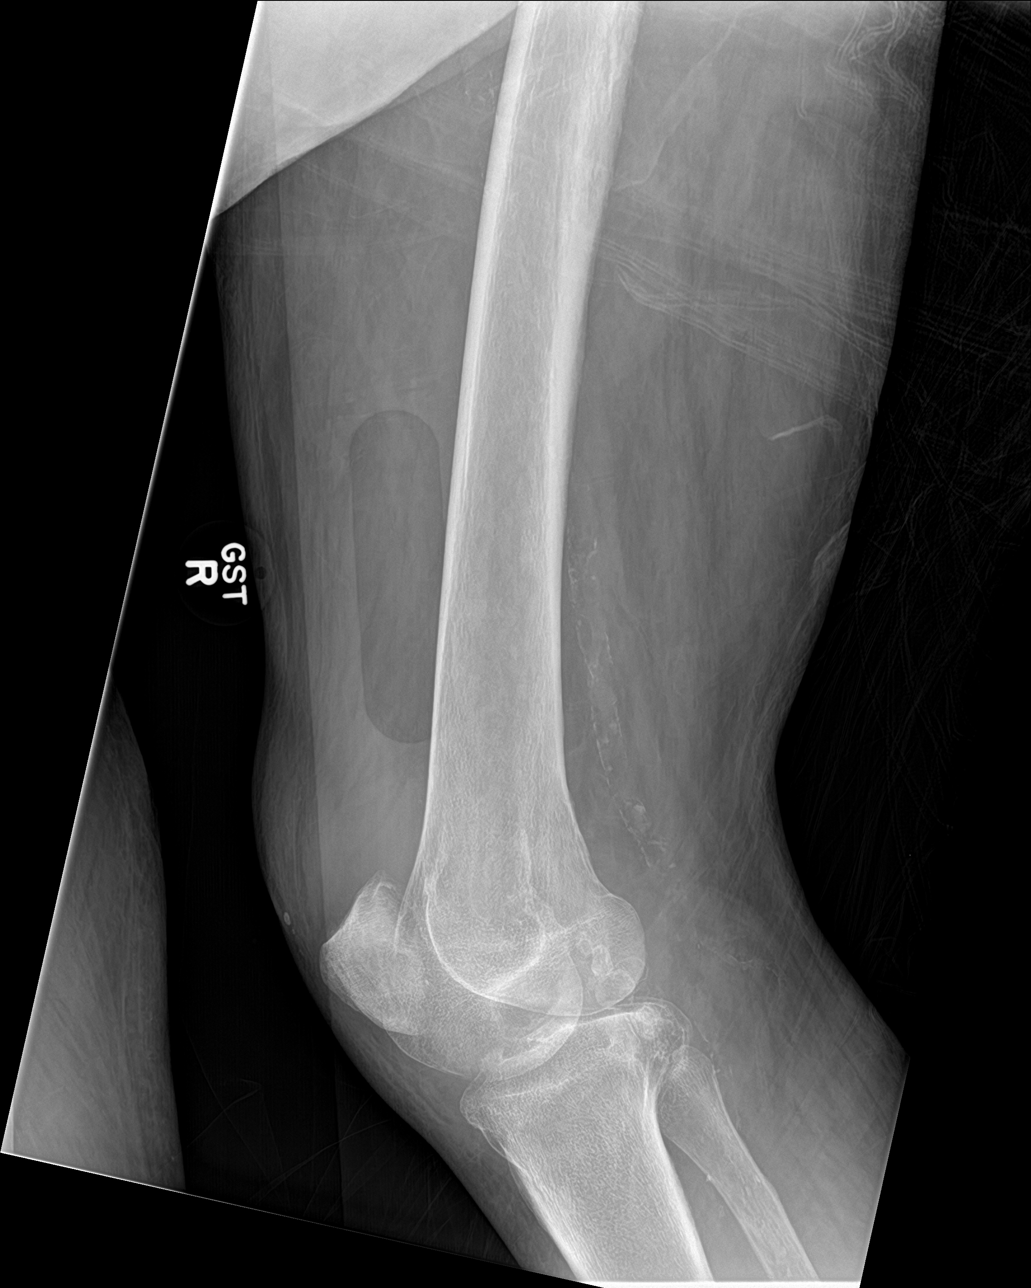

[femur lat (2 of 2)]
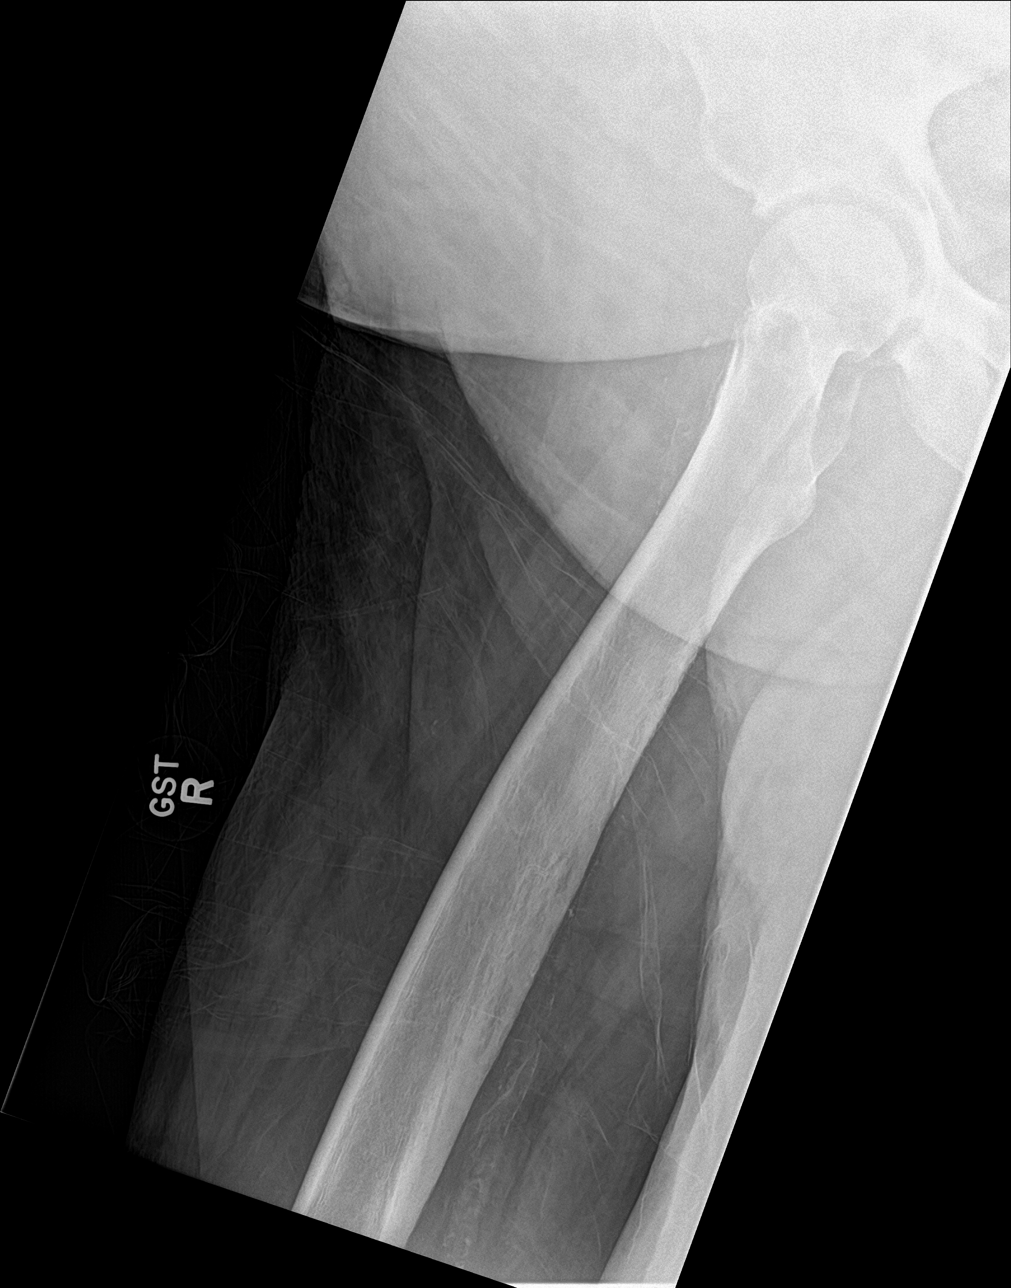

[femur ap (3 of 3)]
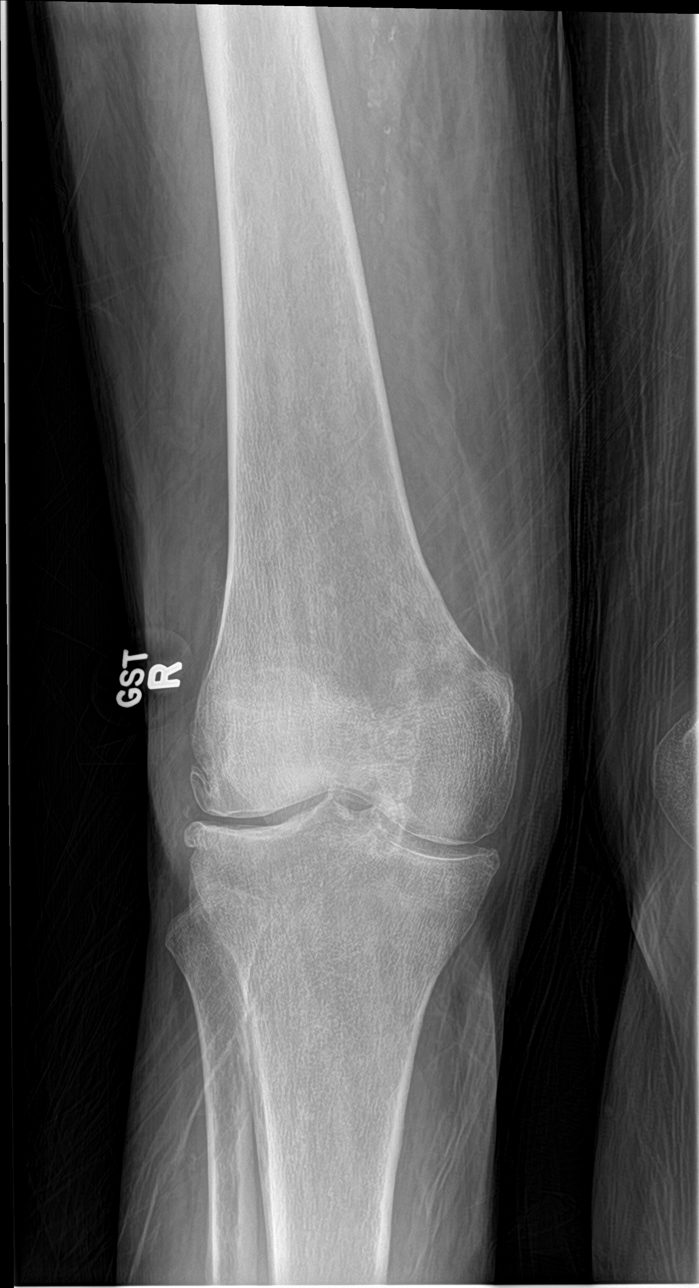

[5 of 5 positions shown; findings below may reference images not displayed]

FINDINGS: Right femur is intact without acute fracture. There is
osteoarthritis of the knee with probable ossified intra-articular
bodies posteriorly. Moderate knee joint effusion. Dense vascular
calcifications.
IMPRESSION: No acute fracture of the right femur.

## 2017-10-28 IMAGING — CR DG KNEE COMPLETE 4+V*R*
5 series · 5 of 5 positions shown · non-contrast
Comparison: None.

CLINICAL DATA: Recent fall with painful right knee, initial
encounter

EXAM:
RIGHT KNEE - COMPLETE 4+ VIEW

[knee lat (1 of 2)]
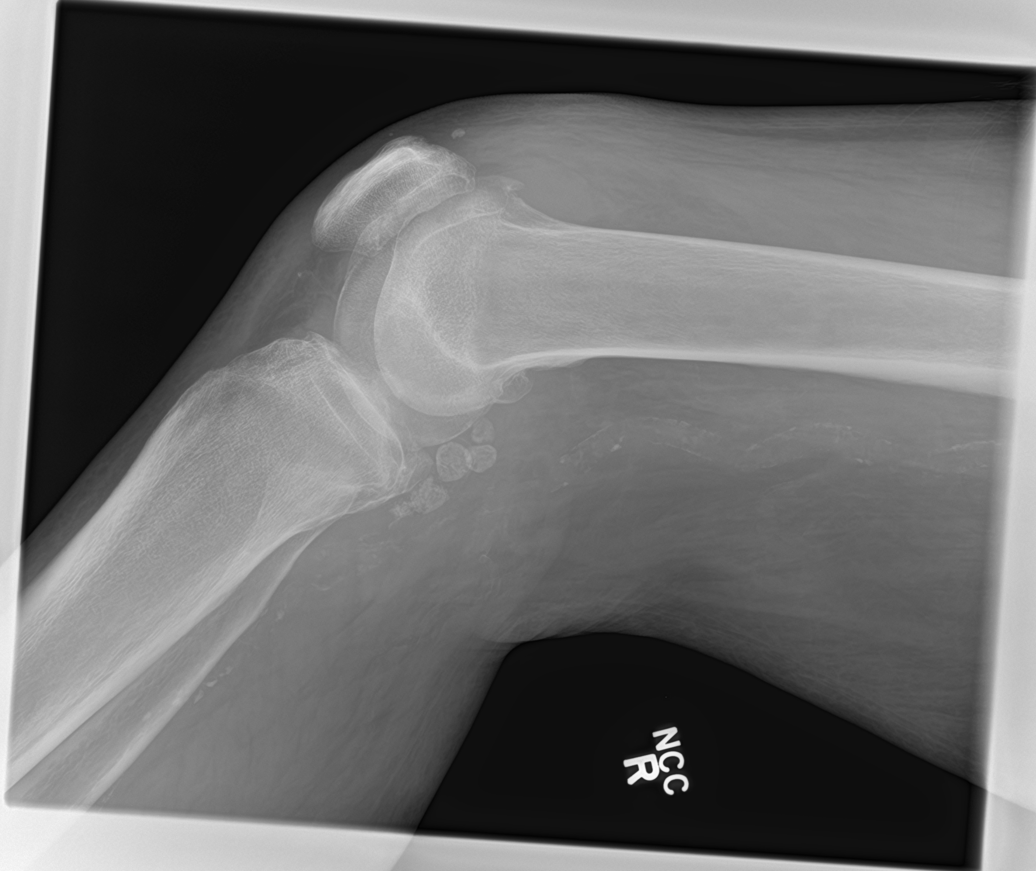

[knee obl (1 of 3)]
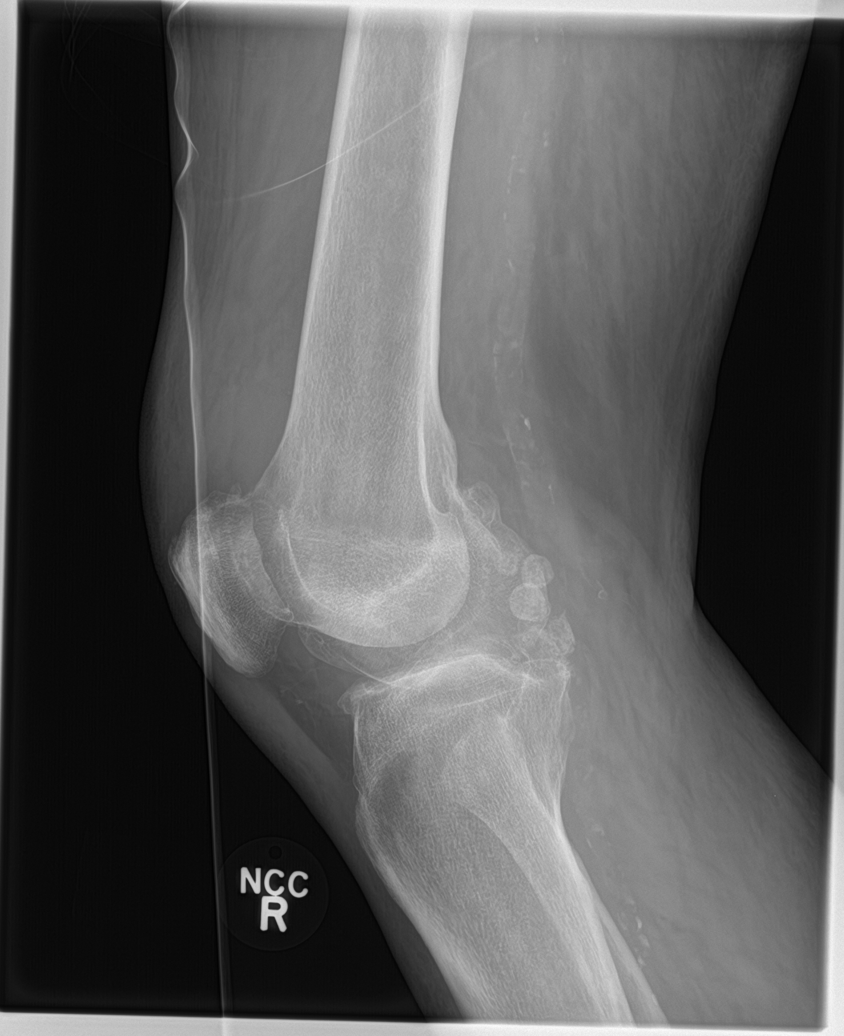

[knee obl (2 of 3)]
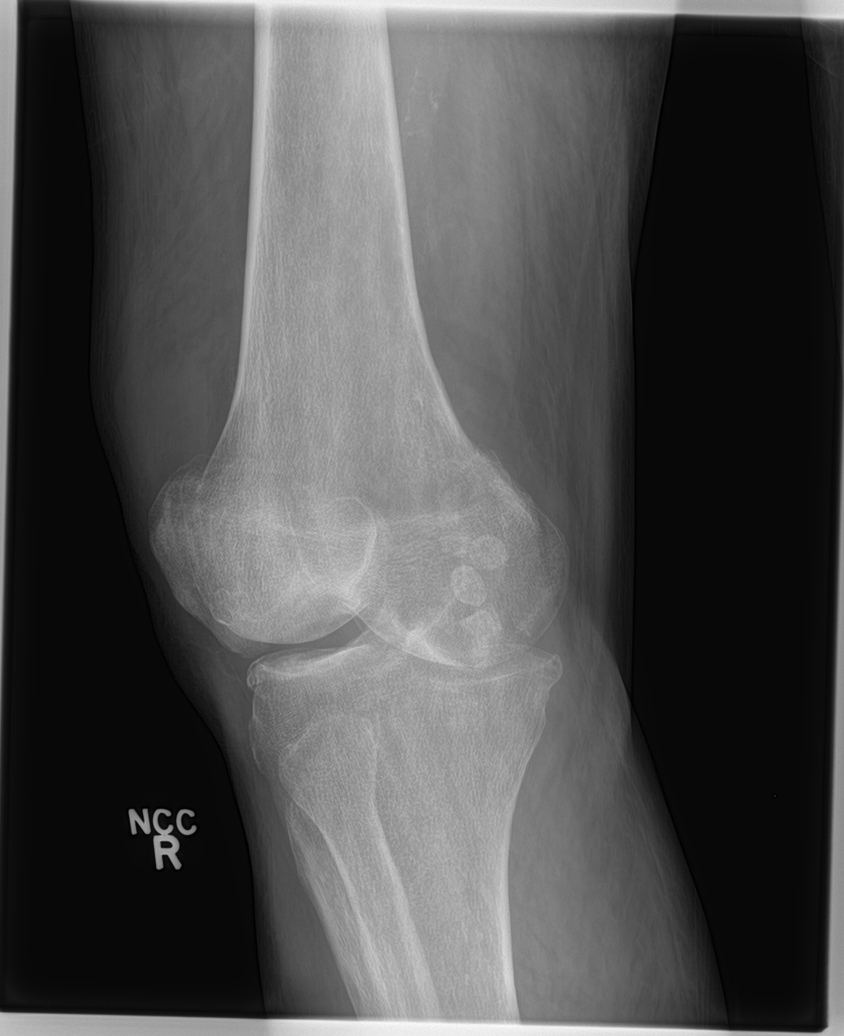

[knee obl (3 of 3)]
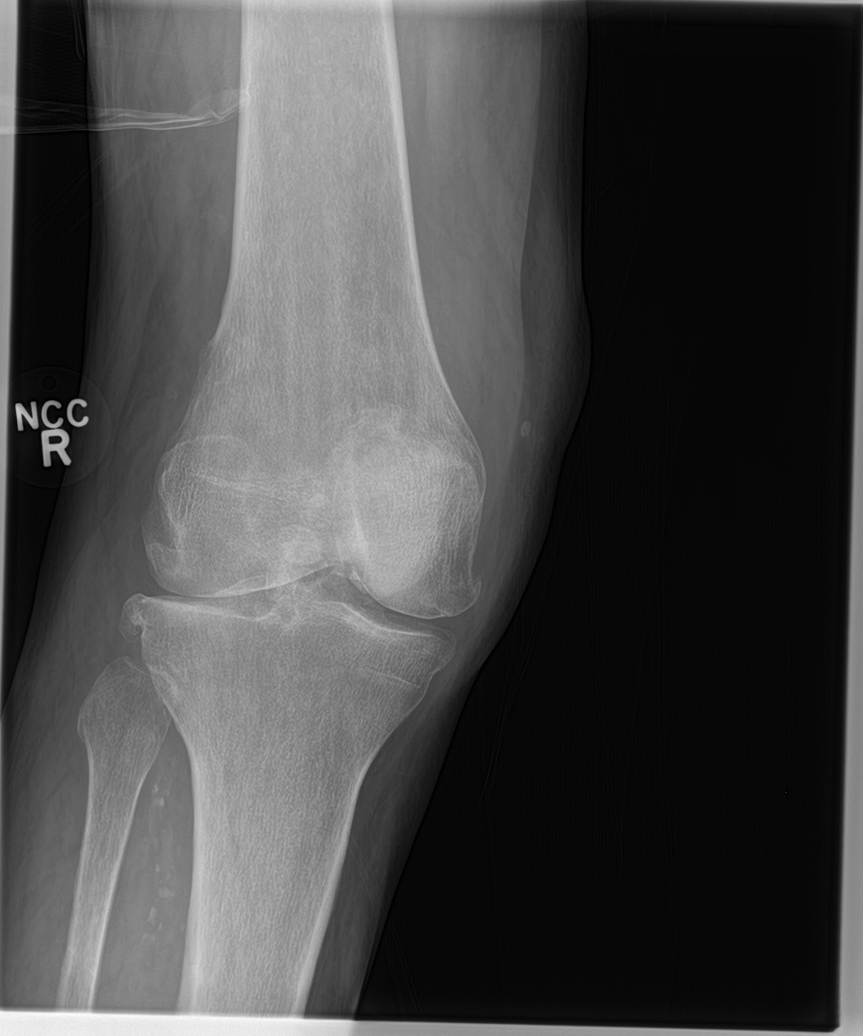

[knee lat (2 of 2)]
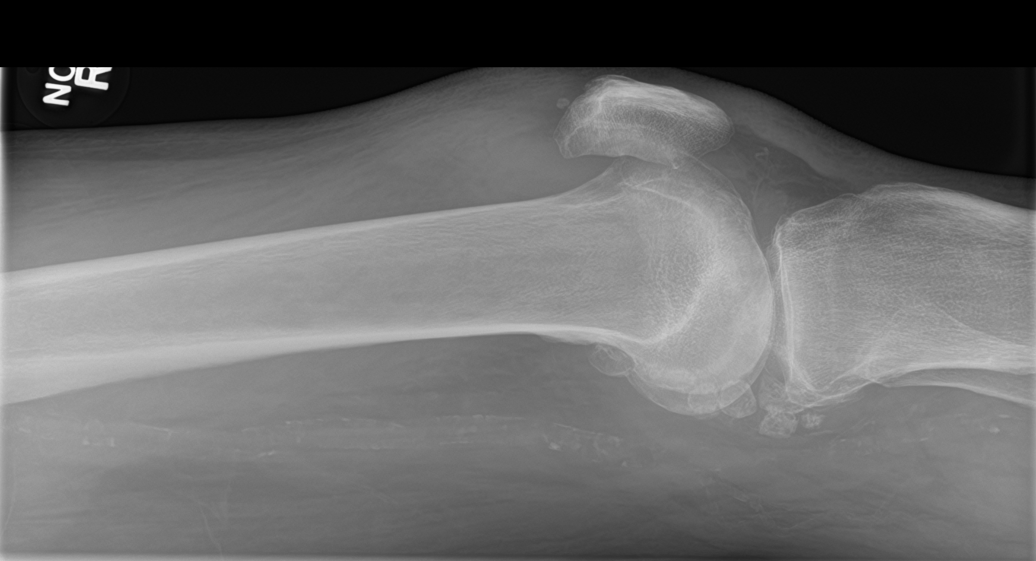

[5 of 5 positions shown; findings below may reference images not displayed]

FINDINGS: Degenerative changes are noted in all 3 joint compartments. The
joint space narrowing is worst in the lateral joint compartment.
Multiple well corticated loose bodies are noted posteriorly within
the joint. Small joint effusion is noted. No acute fracture or
dislocation is seen.
IMPRESSION: Degenerative changes with small joint effusion. No acute fracture is
seen.

## 2017-10-28 IMAGING — CT CT HEAD W/O CM
2 series · 15 of 30 positions shown, 17 images · non-contrast
Comparison: Brain CT 08/09/2012

CLINICAL DATA: Patient status post fall. No reported loss of
consciousness.

EXAM:
CT HEAD WITHOUT CONTRAST
TECHNIQUE: Contiguous axial images were obtained from the base of the skull
through the vertex without intravenous contrast.

[Series 2: head without · axial · non-contrast · 0.40mm/px · z∈[-128,-8]mm · 7 of 33 slices shown, 9 images]
[im 5/33  brain]
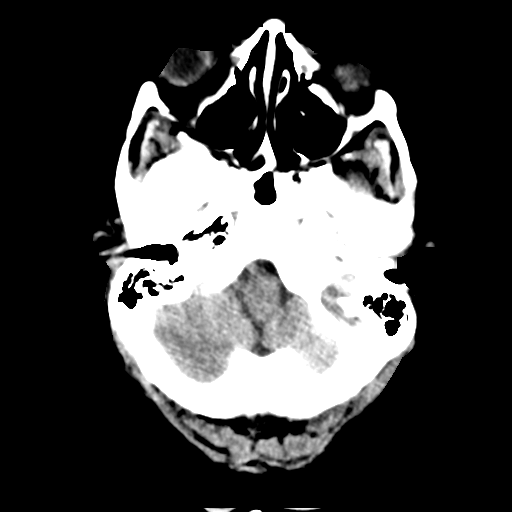
[im 5/33  bone]
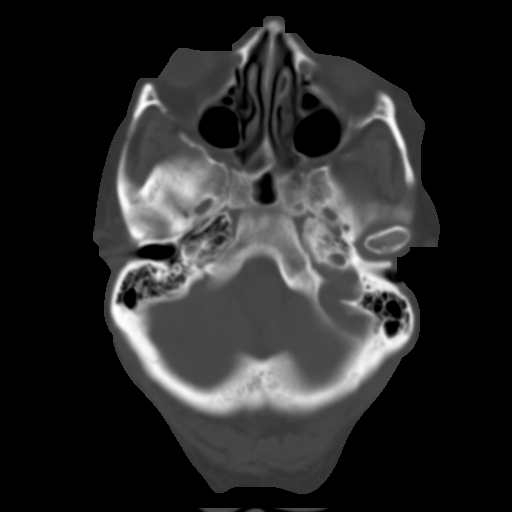
[im 9/33  brain]
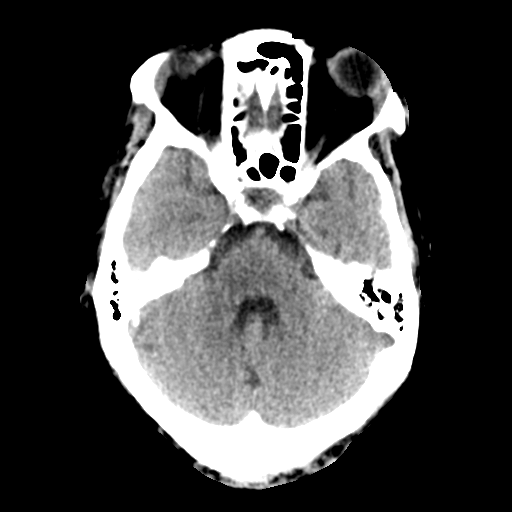
[im 13/33  brain]
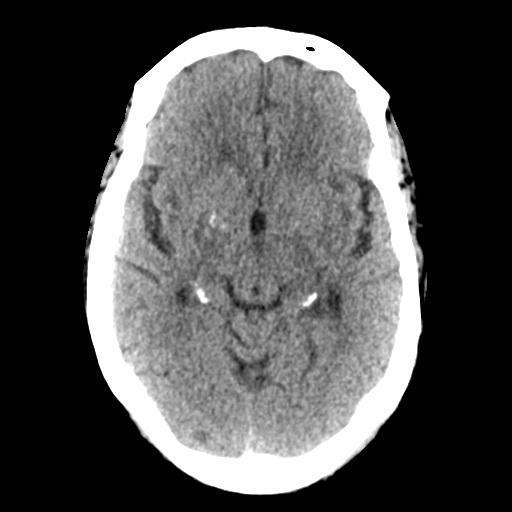
[im 17/33  brain]
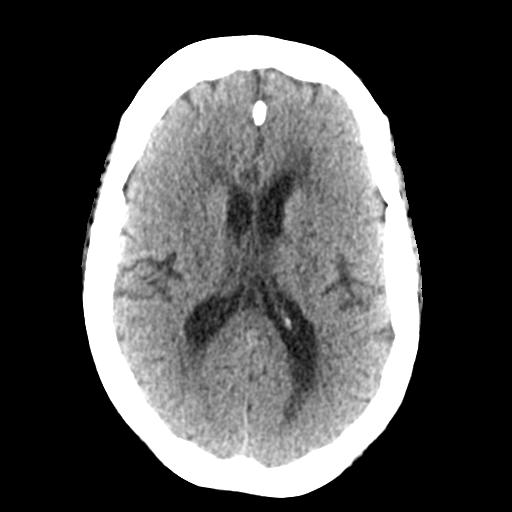
[im 21/33  brain]
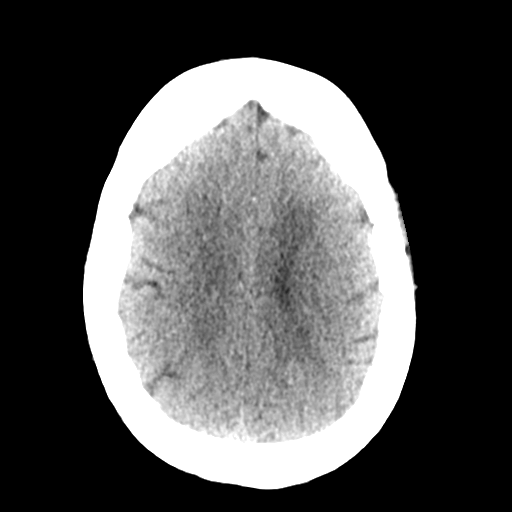
[im 21/33  bone]
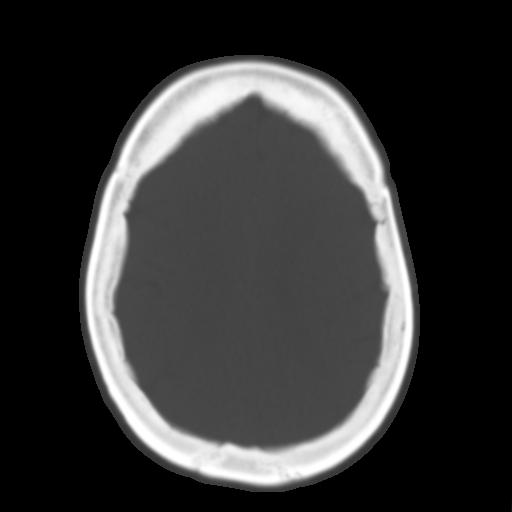
[im 25/33  brain]
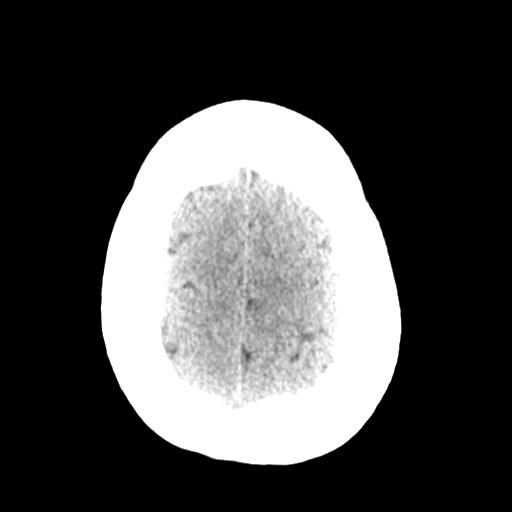
[im 29/33  brain]
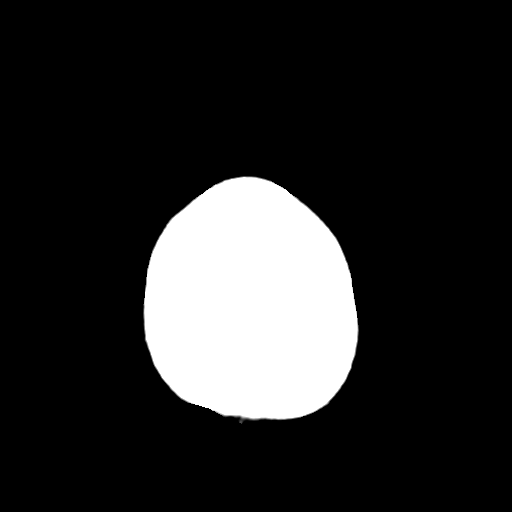

[Series 3: head bone · axial · 0.40mm/px · z∈[-132,-4]mm · 8 of 81 slices shown]
[im 9/81  bone]
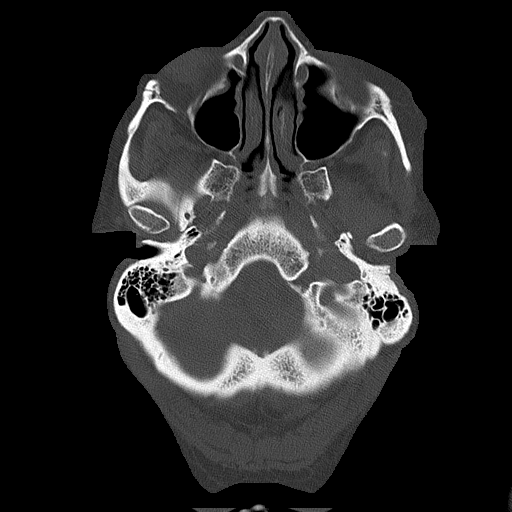
[im 17/81  bone]
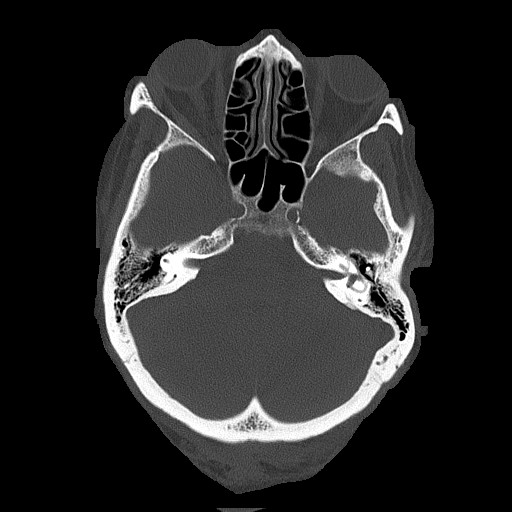
[im 25/81  bone]
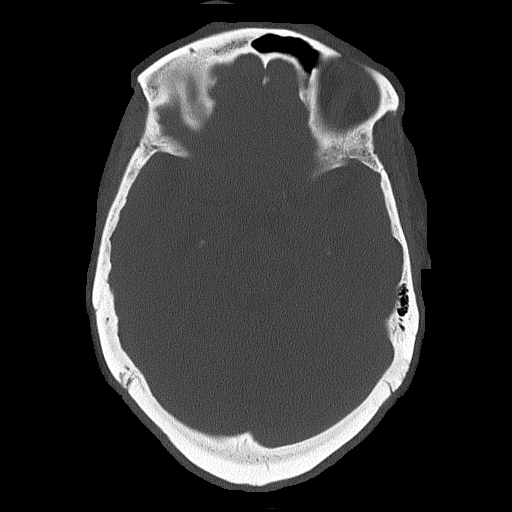
[im 37/81  bone]
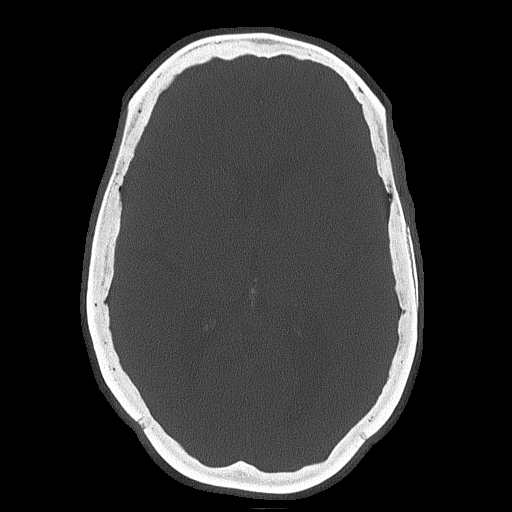
[im 45/81  bone]
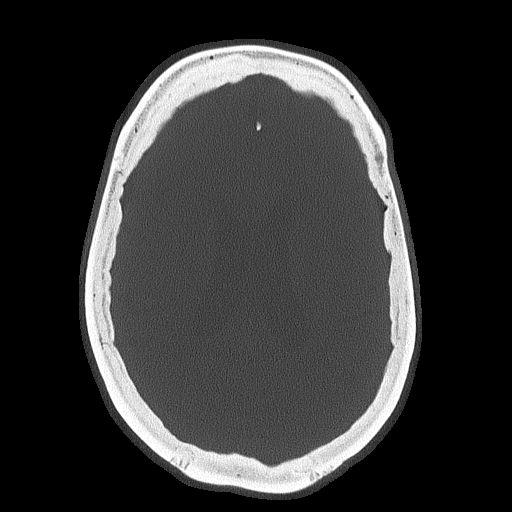
[im 57/81  bone]
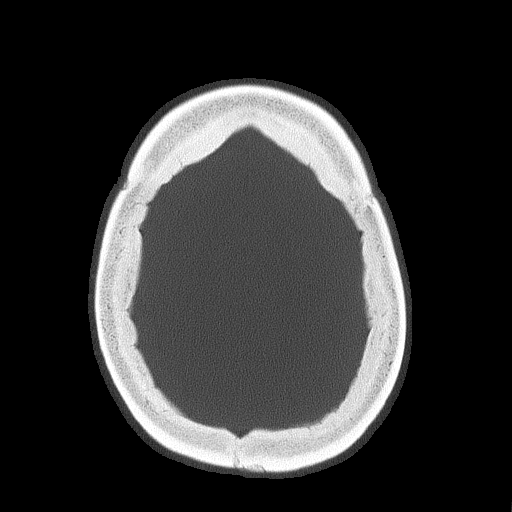
[im 65/81  bone]
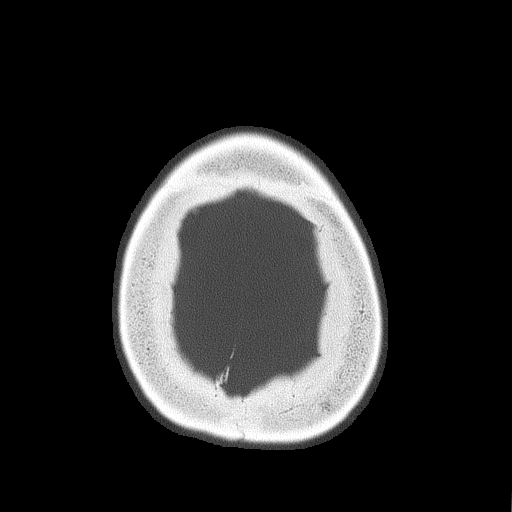
[im 73/81  bone]
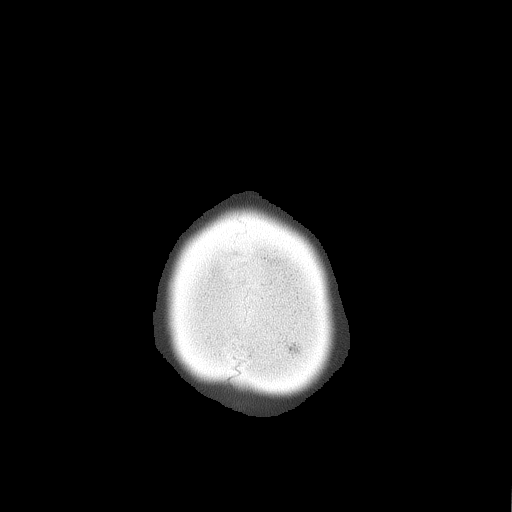

[15 of 30 positions shown; findings below may reference images not displayed]

FINDINGS: Ventricles and sulci are appropriate for patient's age.
Periventricular and subcortical white matter hypodensity compatible
with chronic microvascular ischemic changes. No evidence for acute
cortically based infarct, intracranial hemorrhage, mass lesion or
mass-effect. Paranasal sinuses are well aerated. Mastoid air cells
unremarkable. Calvarium is intact.
IMPRESSION: No acute intracranial process.

Chronic microvascular ischemic changes.

## 2022-11-20 DEATH — deceased
